# Patient Record
Sex: Male | Born: 1970 | Race: White | Hispanic: No | Marital: Single | Smoking: Never smoker
Health system: Southern US, Community
[De-identification: ages and names within clinical notes are randomized; demographics above are authoritative.]

## PROBLEM LIST (undated history)

## (undated) DIAGNOSIS — I10 Essential (primary) hypertension: Secondary | ICD-10-CM

## (undated) DIAGNOSIS — F1020 Alcohol dependence, uncomplicated: Secondary | ICD-10-CM

## (undated) DIAGNOSIS — M069 Rheumatoid arthritis, unspecified: Secondary | ICD-10-CM

## (undated) DIAGNOSIS — F329 Major depressive disorder, single episode, unspecified: Secondary | ICD-10-CM

## (undated) DIAGNOSIS — F419 Anxiety disorder, unspecified: Secondary | ICD-10-CM

## (undated) DIAGNOSIS — F1910 Other psychoactive substance abuse, uncomplicated: Secondary | ICD-10-CM

## (undated) DIAGNOSIS — F32A Depression, unspecified: Secondary | ICD-10-CM

## (undated) HISTORY — PX: OTHER SURGICAL HISTORY: SHX169

## (undated) HISTORY — DX: Other psychoactive substance abuse, uncomplicated: F19.10

---

## 2005-09-11 ENCOUNTER — Emergency Department: Payer: Self-pay | Admitting: Unknown Physician Specialty

## 2007-02-17 ENCOUNTER — Emergency Department: Payer: Self-pay | Admitting: Emergency Medicine

## 2007-02-17 ENCOUNTER — Other Ambulatory Visit: Payer: Self-pay

## 2008-10-05 ENCOUNTER — Other Ambulatory Visit: Payer: Self-pay

## 2008-10-05 ENCOUNTER — Emergency Department: Payer: Self-pay | Admitting: Emergency Medicine

## 2011-02-19 ENCOUNTER — Inpatient Hospital Stay: Payer: Self-pay | Admitting: Internal Medicine

## 2014-02-10 ENCOUNTER — Encounter (HOSPITAL_COMMUNITY): Payer: Self-pay | Admitting: Emergency Medicine

## 2014-02-10 ENCOUNTER — Emergency Department (HOSPITAL_COMMUNITY)
Admission: EM | Admit: 2014-02-10 | Discharge: 2014-02-12 | Disposition: A | Discharge status: Other Acute Inpatient Hospital | Payer: No Typology Code available for payment source | Attending: Emergency Medicine | Admitting: Emergency Medicine

## 2014-02-10 DIAGNOSIS — R000 Tachycardia, unspecified: Secondary | ICD-10-CM | POA: Insufficient documentation

## 2014-02-10 DIAGNOSIS — Z008 Encounter for other general examination: Secondary | ICD-10-CM | POA: Insufficient documentation

## 2014-02-10 DIAGNOSIS — Z8659 Personal history of other mental and behavioral disorders: Secondary | ICD-10-CM | POA: Insufficient documentation

## 2014-02-10 DIAGNOSIS — F411 Generalized anxiety disorder: Secondary | ICD-10-CM | POA: Insufficient documentation

## 2014-02-10 DIAGNOSIS — F259 Schizoaffective disorder, unspecified: Secondary | ICD-10-CM | POA: Diagnosis present

## 2014-02-10 DIAGNOSIS — R443 Hallucinations, unspecified: Secondary | ICD-10-CM | POA: Insufficient documentation

## 2014-02-10 DIAGNOSIS — F17200 Nicotine dependence, unspecified, uncomplicated: Secondary | ICD-10-CM | POA: Insufficient documentation

## 2014-02-10 DIAGNOSIS — F319 Bipolar disorder, unspecified: Secondary | ICD-10-CM | POA: Diagnosis present

## 2014-02-10 DIAGNOSIS — I10 Essential (primary) hypertension: Secondary | ICD-10-CM | POA: Insufficient documentation

## 2014-02-10 HISTORY — DX: Essential (primary) hypertension: I10

## 2014-02-10 LAB — CBC
HCT: 43.8 % (ref 39.0–52.0)
Hemoglobin: 15.4 g/dL (ref 13.0–17.0)
MCH: 30.9 pg (ref 26.0–34.0)
MCHC: 35.2 g/dL (ref 30.0–36.0)
MCV: 87.8 fL (ref 78.0–100.0)
Platelets: 285 10*3/uL (ref 150–400)
RBC: 4.99 MIL/uL (ref 4.22–5.81)
RDW: 14.6 % (ref 11.5–15.5)
WBC: 8.8 10*3/uL (ref 4.0–10.5)

## 2014-02-10 LAB — COMPREHENSIVE METABOLIC PANEL
ALT: 34 U/L (ref 0–53)
AST: 39 U/L — AB (ref 0–37)
Albumin: 4.3 g/dL (ref 3.5–5.2)
Alkaline Phosphatase: 76 U/L (ref 39–117)
BUN: 16 mg/dL (ref 6–23)
CALCIUM: 10 mg/dL (ref 8.4–10.5)
CO2: 21 meq/L (ref 19–32)
Chloride: 95 mEq/L — ABNORMAL LOW (ref 96–112)
Creatinine, Ser: 1.08 mg/dL (ref 0.50–1.35)
GFR calc Af Amer: >90 mL/min (ref >90)
GFR, EST NON AFRICAN AMERICAN: 84 mL/min — AB (ref >90)
Glucose, Bld: 92 mg/dL (ref 70–99)
POTASSIUM: 3.8 meq/L (ref 3.7–5.3)
SODIUM: 136 meq/L — AB (ref 137–147)
Total Bilirubin: 1.3 mg/dL — ABNORMAL HIGH (ref 0.3–1.2)
Total Protein: 7.8 g/dL (ref 6.0–8.3)

## 2014-02-10 LAB — RAPID URINE DRUG SCREEN, HOSP PERFORMED
AMPHETAMINES: POSITIVE — AB
Barbiturates: NOT DETECTED
Benzodiazepines: NOT DETECTED
COCAINE: NOT DETECTED
Opiates: NOT DETECTED
TETRAHYDROCANNABINOL: POSITIVE — AB

## 2014-02-10 LAB — ETHANOL: Alcohol, Ethyl (B): <11 mg/dL (ref 0–11)

## 2014-02-10 LAB — ACETAMINOPHEN LEVEL

## 2014-02-10 LAB — SALICYLATE LEVEL: Salicylate Lvl: <2 mg/dL — ABNORMAL LOW (ref 2.8–20.0)

## 2014-02-10 MED ORDER — HALOPERIDOL LACTATE 5 MG/ML IJ SOLN
5.0000 mg | Freq: Once | INTRAMUSCULAR | Status: AC
  Administered 2014-02-10: 5 mg via INTRAMUSCULAR
  Filled 2014-02-10: qty 1

## 2014-02-10 MED ORDER — LORAZEPAM 1 MG PO TABS
1.0000 mg | ORAL_TABLET | Freq: Three times a day (TID) | ORAL | Status: DC | PRN
  Administered 2014-02-11 (×2): 1 mg via ORAL
  Filled 2014-02-10 (×3): qty 1

## 2014-02-10 MED ORDER — ONDANSETRON HCL 4 MG PO TABS: 4.0000 mg | ORAL_TABLET | Freq: Three times a day (TID) | ORAL | Status: DC | PRN

## 2014-02-10 MED ORDER — IBUPROFEN 200 MG PO TABS: 600.0000 mg | ORAL_TABLET | Freq: Three times a day (TID) | ORAL | Status: DC | PRN

## 2014-02-10 MED ORDER — ALUM & MAG HYDROXIDE-SIMETH 200-200-20 MG/5ML PO SUSP: 30.0000 mL | ORAL | Status: DC | PRN

## 2014-02-10 MED ORDER — ZOLPIDEM TARTRATE 5 MG PO TABS
5.0000 mg | ORAL_TABLET | Freq: Every evening | ORAL | Status: DC | PRN
  Administered 2014-02-11: 5 mg via ORAL
  Filled 2014-02-10: qty 1

## 2014-02-10 MED ORDER — NICOTINE 21 MG/24HR TD PT24: 21.0000 mg | MEDICATED_PATCH | Freq: Every day | TRANSDERMAL | Status: DC

## 2014-02-10 MED ORDER — LORAZEPAM 1 MG PO TABS
1.0000 mg | ORAL_TABLET | Freq: Once | ORAL | Status: AC
  Administered 2014-02-10: 1 mg via ORAL
  Filled 2014-02-10: qty 1

## 2014-02-10 NOTE — ED Notes (Addendum)
According to [place] best friend, schizoaffective disorder. Increasingly more paranoid. Hospitalized in [place] about 7 months ago. Hx of alcohol and drug addiction but to her knowledge has been in recovery less than a year. Pt has hx of hurting himself. Scars on arms are 24 or 43 years old.  Leandro Reasoner number is [phone]

## 2014-02-10 NOTE — BH Assessment (Addendum)
Tele Assessment Note   Curtis Cobb is an 43 y.o. male that was assessed by CSW on this date, in person after presenting to [facility] voluntarily voicing complaints of auditory and visual hallucinations.  Pt presents with pleasant mood but appears agitated, angry at times and preoccupied by the voices.  Pt continuously apologized to CSW for not being able to answer CSW's questions due to being distracted by the voices.  Pt states that he is trying not to listen to them but was observed to respond to them, making statements like "I'm not going to be mad!!!" and "I'm on meds now." and yelling at them.  When asked further about these voices, such as what they were saying and if they had names, pt states that currently he was listening to his friends Curtis Cobb and Curtis Cobb talking outside of the room.  Pt directed CSW to open and close his room door a few times throughout the assessment.  CSW was informed later by pt's RN that his friend Curtis Cobb left awhile ago and was not in the hospital at all, as pt believed.  Pt states that his voices are either one individual or a group having a conversation.  Voices don't appear to be command at this time.    Pt states that he stopped taking his meds a few months ago because he felt like he was doing better but over the last month his mental health has declined.  Pt expressed a lot of guilt for feeling like he let down his friends and himself, for not caring for himself as he should.  Pt states that he tried to accept the voices and learn to deal with them on his own but it didn't work.  Pt denies SI/HI.  Pt states that he was hospitalized inpatient awhile ago in [place] and followed up outpatient at [facility] in [place] after d/c.  Pt reports he did really well on Haldol and Lithium at that time.  Pt reports a history of drug and alcohol use but was unable to answer last use or what substances.  Pt reports living in [place] and his friend Curtis Cobb living in  [place].  CSW attempted to reach [facility] for collateral information but no one answered and no voicemail is set up [phone]).    Pt exhibits thought blocking and an inability to concentrate on the present conversation.  Pt takes a long time to answer questions or doesn't answer them at all, due to responding to the voices.  Pt appears very intelligent by being able to explain his diagnosis, symptoms and history of mental health treatment.  Pt was observed to make odd movements, such as tensing his entire body and releasing as well as shaking his hands and feet.  Pt states that he knows he needs inpatient hospitalization to get back on his meds and get better.  CSW ran pt by Nanine Means, NP and pt was accepted to [facility] pending a 400 hall bed and TTS should continue to seek placement elsewhere.     Axis I: Psychotic Disorder NOS Axis II: Deferred Axis III:  Past Medical History  Diagnosis Date  . Bipolar 1 disorder   . Schizophrenia   . Hypertension    Axis IV: occupational problems, other psychosocial or environmental problems, problems related to social environment, problems with access to health care services and problems with primary support group Axis V: 11-20 some danger of hurting self or others possible OR occasionally fails to maintain minimal  personal hygiene OR gross impairment in communication  Past Medical History:  Past Medical History  Diagnosis Date  . Bipolar 1 disorder   . Schizophrenia   . Hypertension     History reviewed. No pertinent past surgical history.  Family History: No family history on file.  Social History:  reports that he has been smoking Cigarettes.  He has been smoking about 0.00 packs per day. He does not have any smokeless tobacco history on file. He reports that he drinks alcohol. He reports that he uses illicit drugs (Marijuana).  Additional Social History:  Alcohol / Drug Use Pain Medications: See MAR Prescriptions: See MAR Over the  Counter: See MAR History of alcohol / drug use?: Yes Longest period of sobriety (when/how long): unknown drug history as pt was unable to say at this time  CIWA: CIWA-Ar BP: 134/74 mmHg Pulse Rate: 109 COWS:    Allergies:  Allergies  Allergen Reactions  . Sulfa Antibiotics Other (See Comments)    Unknown reaction    Home Medications:  (Not in a hospital admission)  OB/GYN Status:  No LMP for male patient.  General Assessment Data [facility]: [facility] ACT Assessment: Yes Is this a Tele or Face-to-Face Assessment?: Face-to-Face Is this an Initial Assessment or a Re-assessment for this encounter?: Initial Assessment Living Arrangements: Other (Comment) (unknown) Can pt return to current living arrangement?:  (unknown) Admission Status: Voluntary Is patient capable of signing voluntary admission?: Yes Transfer from: Home Referral Source: Self/Family/Friend  Medical Screening Exam [facility] Walk-in ONLY) Medical Exam completed: Yes  [facility] Crisis Care Plan Living Arrangements: Other (Comment) (unknown) Name of Psychiatrist:  (N/A) Name of Therapist:  (N/A)  Education Status Is patient currently in school?: No Current Grade:  (N/A) Highest grade of school patient has completed:  (unknown) Name of school:  (N/A) Contact person:  (N/A)  Risk to self Suicidal Ideation: No Suicidal Intent: No Is patient at risk for suicide?: No Suicidal Plan?: No Access to Means: No What has been your use of drugs/alcohol within the last 12 months?:  (pt reports a history of alcohol/drug use) Previous Attempts/Gestures: Yes How many times?:  (1) Other Self Harm Risks:  (N/A) Triggers for Past Attempts: Unknown Intentional Self Injurious Behavior: Cutting (in the past) Comment - Self Injurious Behavior:  (N/A) Family Suicide History: Unknown Recent stressful life event(s):  (unknown) Persecutory voices/beliefs?: Yes Depression: Yes Depression Symptoms: Guilt;Feeling  worthless/self pity;Feeling angry/irritable Substance abuse history and/or treatment for substance abuse?: No Suicide prevention information given to non-admitted patients: Not applicable  Risk to Others Homicidal Ideation: No Thoughts of Harm to Others: No Current Homicidal Intent: No Current Homicidal Plan: No Access to Homicidal Means: No Identified Victim:  (N/A) History of harm to others?: No Assessment of Violence: None Noted Violent Behavior Description:  (N/A) Does patient have access to weapons?: No Criminal Charges Pending?: No Does patient have a court date: No  Psychosis Hallucinations: Auditory;Visual;With command Delusions: Unspecified  Mental Status Report Appear/Hygiene: Disheveled Eye Contact: Poor Motor Activity: Agitation;Mannerisms;Restlessness;Rigidity Speech: Tangential;Pressured Level of Consciousness: Alert;Quiet/awake;Restless;Irritable Mood: Preoccupied;Ambivalent;Ashamed/humiliated;Worthless, low self-esteem;Helpless;Guilty;Fearful Affect: Preoccupied;Irritable;Apprehensive Anxiety Level: Moderate Thought Processes: Irrelevant;Tangential Judgement: Impaired Orientation: Unable to assess Obsessive Compulsive Thoughts/Behaviors: None  Cognitive Functioning Concentration: Decreased Memory: Recent Impaired;Remote Impaired IQ: Above Average Insight: Good Impulse Control: Good Appetite: Good Weight Loss:  (N/A) Weight Gain:  (N/A) Sleep: No Change Total Hours of Sleep:  (unknown) Vegetative Symptoms: None  ADLScreening [facility] Assessment Services) Patient's cognitive ability adequate to safely complete daily  activities?: Yes Patient able to express need for assistance with ADLs?: Yes Independently performs ADLs?: Yes (appropriate for developmental age)  Prior Inpatient Therapy Prior Inpatient Therapy: Yes Prior Therapy Dates:  (pt unsure - MD note states 7 months ago) Prior Therapy Facilty/Provider(s):  (hospital in [place]) Reason for  Treatment:  (psychosis)  Prior Outpatient Therapy Prior Outpatient Therapy: Yes Prior Therapy Dates:  [facility] in [place]) Prior Therapy Facilty/Provider(s):  (7 months ago) Reason for Treatment:  (psychosis)  ADL Screening (condition at time of admission) Patient's cognitive ability adequate to safely complete daily activities?: Yes Is the patient deaf or have difficulty hearing?: No Does the patient have difficulty seeing, even when wearing glasses/contacts?: No Does the patient have difficulty concentrating, remembering, or making decisions?: Yes Patient able to express need for assistance with ADLs?: Yes Does the patient have difficulty dressing or bathing?: No Independently performs ADLs?: Yes (appropriate for developmental age) Does the patient have difficulty walking or climbing stairs?: No Weakness of Legs: None Weakness of Arms/Hands: None  Home Assistive Devices/Equipment Home Assistive Devices/Equipment: None  Therapy Consults (therapy consults require a physician order) PT Evaluation Needed: No OT Evalulation Needed: No SLP Evaluation Needed: No   Values / Beliefs Cultural Requests During Hospitalization: None Spiritual Requests During Hospitalization: None   Advance Directives (For Healthcare) Advance Directive: Patient does not have advance directive Pre-existing out of facility DNR order (yellow form or pink MOST form): No Nutrition Screen- [facility] Adult/[facility] Patient's home diet: Regular  Additional Information 1:1 In Past 12 Months?: No CIRT Risk: No Elopement Risk: No Does patient have medical clearance?: Yes  Child/Adolescent Assessment Running Away Risk: Denies Bed-Wetting: Denies Destruction of Property: Denies Cruelty to Animals: Denies Stealing: Denies Rebellious/Defies Authority: Denies Satanic Involvement: Denies Archivist: Denies Problems at [employer]: Denies Gang Involvement: Denies  Disposition:   Disposition Initial Assessment Completed for this Encounter: Yes Disposition of Patient: Inpatient treatment program Type of inpatient treatment program: Adult  Carmina Miller 02/10/2014 5:13 PM

## 2014-02-10 NOTE — ED Provider Notes (Signed)
Medical screening examination/treatment/procedure(s) were conducted as a shared visit with non-physician practitioner(s) and myself.  I personally evaluated the patient during the encounter.  EKG Interpretation    Date/Time:  Saturday February 10 2014 14:54:39 EST Ventricular Rate:  122 PR Interval:  147 QRS Duration: 89 QT Interval:  326 QTC Calculation: 464 R Axis:   20 Text Interpretation:  Sinus tachycardia Probable left atrial enlargement Anteroseptal infarct, age indeterminate Confirmed by Fayrene Fearing  MD, MARK ([id number]) on 02/10/2014 2:57:36 PM            43 yo male with hx of schizophrenia presenting due to hallucinations.  States he has been hearing voices frequently.  Usually just people having conversations.  Not commanding him.  No SI/HI.  On exam, he appears very anxious, frequently appears to be responding to internal stimuli, but nontoxic, normal respiratory pattern and perfusion.  Plan TTS consult for psychiatric eval.    Clinical Impression: 1. Hallucinations   2. Medical clearance for psychiatric admission       Candyce Churn III, MD 02/11/14 [phone]

## 2014-02-10 NOTE — ED Provider Notes (Signed)
CSN: [id number]     Arrival date & time 02/10/14  1415 History   First MD Initiated Contact with Patient 02/10/14 1458     Chief Complaint  Patient presents with  . Medical Clearance     (Consider location/radiation/quality/duration/timing/severity/associated sxs/prior Treatment) HPI Comments: Patient is a 43 year old male with a past medical history of bipolar, schizophrenia and hypertension who presents to the emergency department complaining of hallucinations over the past year worsening over the past month. Patient states in the past he was on Haldol and lithium, however a few months ago he decided to stop taking the medications because he was feeling better. Over the past month he states he has had a "slow breakdown" and is beginning to have both auditory and visual hallucinations. Currently he states there is some out of the exam room named "[name]" trying to talk to him. Admits to alcohol use and marijuana use 3 weeks ago. Denies suicidal or homicidal ideations, however he did have a suicide attempt about 20 years ago. Patient continuously saying "this does not make any sense, this is nuts, this is crazy". Level 5 caveat due to psychosis.  The history is provided by the patient.    Past Medical History  Diagnosis Date  . Bipolar 1 disorder   . Schizophrenia   . Hypertension    History reviewed. No pertinent past surgical history. No family history on file. History  Substance Use Topics  . Smoking status: Current Every Day Smoker    Types: Cigarettes  . Smokeless tobacco: Not on file  . Alcohol Use: Yes     Comment: socially    Review of Systems  Unable to perform ROS: Psychiatric disorder      Allergies  Sulfa antibiotics  Home Medications  No current outpatient prescriptions on file. BP 120/74  Pulse 98  Temp(Src) 98.5 F (36.9 C) (Oral)  Resp 20  SpO2 94% Physical Exam  Nursing note and vitals reviewed. Constitutional: He is oriented to person, place, and  time. He appears well-developed and well-nourished. No distress.  Anxious. Fidgety.  HENT:  Head: Normocephalic and atraumatic.  Mouth/Throat: Oropharynx is clear and moist.  Eyes: Conjunctivae and EOM are normal. Pupils are equal, round, and reactive to light.  Neck: Normal range of motion. Neck supple.  Cardiovascular: Regular rhythm and normal heart sounds.  Tachycardia present.   Pulmonary/Chest: Effort normal and breath sounds normal.  Musculoskeletal: Normal range of motion. He exhibits no edema.  Neurological: He is alert and oriented to person, place, and time.  Skin: Skin is warm and dry. He is not diaphoretic.  Psychiatric: His mood appears anxious. He is actively hallucinating. He expresses no homicidal and no suicidal ideation.    ED Course  Procedures (including critical care time) Labs Review Labs Reviewed  COMPREHENSIVE METABOLIC PANEL - Abnormal; Notable for the following:    Sodium 136 (*)    Chloride 95 (*)    AST 39 (*)    Total Bilirubin 1.3 (*)    GFR calc non Af Amer 84 (*)    All other components within normal limits  SALICYLATE LEVEL - Abnormal; Notable for the following:    Salicylate Lvl <2.0 (*)    All other components within normal limits  URINE RAPID DRUG SCREEN (HOSP PERFORMED) - Abnormal; Notable for the following:    Amphetamines POSITIVE (*)    Tetrahydrocannabinol POSITIVE (*)    All other components within normal limits  ACETAMINOPHEN LEVEL  CBC  ETHANOL  Imaging Review No results found.  EKG Interpretation    Date/Time:  Saturday February 10 2014 14:54:39 EST Ventricular Rate:  122 PR Interval:  147 QRS Duration: 89 QT Interval:  326 QTC Calculation: 464 R Axis:   20 Text Interpretation:  Sinus tachycardia Probable left atrial enlargement Anteroseptal infarct, age indeterminate Confirmed by Fayrene Fearing  MD, MARK ([id number]) on 02/10/2014 2:57:36 PM            MDM   Final diagnoses:  Hallucinations  Medical clearance for  psychiatric admission    Pt presenting with hallucinations. No SI/HI. Very anxious. Labs pending. TTS consult.  Patient medically cleared. He was evaluated by psychiatric social worker who suggested inpatient treatment.  Trevor Mace, PA-C 02/11/14 0005

## 2014-02-10 NOTE — ED Notes (Signed)
Pt from home reports that for the past month he has been hearing voices, complete conversations for the past year. Pt received was placed on Haldol and lithium. Pt felt that he was feeling better and voices stopped, so stopped taking meds. Pt reports a "slow breakdown since." Pt is anxious, shaking and tachycardic at 137 in triage. Pt states that he is having hallucinations at night such as shadows in the shapes of people. Pt denies SI/HI, but states that he has hx of suicide attempt approx 20 years ago. Pt is A&O and in NAD but extremely scared and anxious stating "this isn't making any sense."

## 2014-02-11 ENCOUNTER — Encounter (HOSPITAL_COMMUNITY): Payer: Self-pay | Admitting: Registered Nurse

## 2014-02-11 DIAGNOSIS — F319 Bipolar disorder, unspecified: Secondary | ICD-10-CM

## 2014-02-11 DIAGNOSIS — F411 Generalized anxiety disorder: Secondary | ICD-10-CM

## 2014-02-11 DIAGNOSIS — F259 Schizoaffective disorder, unspecified: Secondary | ICD-10-CM | POA: Diagnosis present

## 2014-02-11 MED ORDER — THIAMINE HCL 100 MG/ML IJ SOLN: 100.0000 mg | Freq: Every day | INTRAMUSCULAR | Status: DC

## 2014-02-11 MED ORDER — LORAZEPAM 1 MG PO TABS: 0.0000 mg | ORAL_TABLET | Freq: Two times a day (BID) | ORAL | Status: DC

## 2014-02-11 MED ORDER — LITHIUM CARBONATE 300 MG PO CAPS
300.0000 mg | ORAL_CAPSULE | Freq: Three times a day (TID) | ORAL | Status: DC
  Administered 2014-02-11 – 2014-02-12 (×3): 300 mg via ORAL
  Filled 2014-02-11 (×3): qty 1

## 2014-02-11 MED ORDER — HALOPERIDOL 2 MG PO TABS
2.0000 mg | ORAL_TABLET | Freq: Two times a day (BID) | ORAL | Status: DC
  Administered 2014-02-11 – 2014-02-12 (×2): 2 mg via ORAL
  Filled 2014-02-11 (×2): qty 1

## 2014-02-11 MED ORDER — TRAZODONE HCL 100 MG PO TABS
100.0000 mg | ORAL_TABLET | Freq: Every evening | ORAL | Status: DC | PRN
  Administered 2014-02-11: 100 mg via ORAL
  Filled 2014-02-11: qty 1

## 2014-02-11 MED ORDER — LORAZEPAM 1 MG PO TABS
0.0000 mg | ORAL_TABLET | Freq: Four times a day (QID) | ORAL | Status: DC
  Administered 2014-02-11 – 2014-02-12 (×3): 1 mg via ORAL
  Filled 2014-02-11 (×2): qty 1

## 2014-02-11 MED ORDER — VITAMIN B-1 100 MG PO TABS
100.0000 mg | ORAL_TABLET | Freq: Every day | ORAL | Status: DC
  Administered 2014-02-11 – 2014-02-12 (×2): 100 mg via ORAL
  Filled 2014-02-11 (×2): qty 1

## 2014-02-11 NOTE — BH Assessment (Signed)
Per Joann Glover, AC at [facility], adult unit is at capacity. Contacted the following facilities for placement:  [facility]: At capacity [facility]: At capacity [facility]: At capacity [facility]: At capacity [facility]: At capacity [facility]: At capacity [facility]: At capacity [facility]: At capacity [facility]: At capacity [facility]: At capacity [facility]: At capacity [facility]: At capacity [facility]: At capacity [facility]: At capacity  Curtis Cobb, LPC, NCC Triage Specialist 

## 2014-02-11 NOTE — [facility] Notes (Signed)
The following facilities have been contacted regarding bed availability for inptx:   [facility]- per Cathryn at capacity  [facility]-per Mariam at capacity  [facility]- per Lacey at capacity  [facility]- per Maria at capacity  [facility]- per Susie at capacity  [facility]- per kayla at capacity  [facility]- per Sherida at capacity  [facility]- per Pam at acapacity  [facility]- per Tarshika at capacity  [facility]- per Barbara at capacity  [facility]- at capacity  [facility]- per Nacy at capacity  [facility]- per Alex at capacity  [facility]- per jason at capacity  [facility]- per Beth at capacity  [facility]- at capacity  [facility]- per Linda at capacity  [facility]- per Joan at capacity  [facility]- per Marian at capacity  [facility]- per Dana at capacity  [facility]- at capacity  [facility]- per Pat on voluntary pts  [facility]- per Susan at capacity  [facility]- per Latrosy at capacity  [facility]- at capacity  [facility]- per Jean at capacity    Ceyda Peterka  Disposition [facility]   

## 2014-02-11 NOTE — ED Notes (Signed)
Pt became very tearful, began to pace the room. Pt sts that he was having a panic attack. Pt took some deep breathes and began to talk about how he just realized that his worl dis falling apart and he can't handle it. I sat and talked with pt. aout his fears. Pt egan to calm down. Pt requestion his PRN medication to help him sleep and relax.

## 2014-02-11 NOTE — ED Notes (Signed)
Pt states he drinks approximately a fifth of liquor a day.  Pt states his last drink was shortly before checking in to ED.  When told that his ETOH level was less than 11 on arrival, but states, "Well, it might have been the day before."

## 2014-02-11 NOTE — ED Notes (Signed)
Pt has 3 pt. Belonging bags:  Bag 1: Black jacket, Firefighter 2: [employer], Khaki pants with grey belt  Bag 3: Black socks, White Tennis shoes.

## 2014-02-11 NOTE — ED Notes (Signed)
Pt now sitting quietly on ed. Pt still appears to be anxious but much less than before.

## 2014-02-11 NOTE — ED Notes (Signed)
Pt complaining of hallucinations - auditory and visual.  Psych doctor informed.

## 2014-02-11 NOTE — ED Notes (Signed)
3 patient belongings bags moved to locker 29.

## 2014-02-11 NOTE — ED Notes (Signed)
Pt's 3 belongings bags moved to locker 35.

## 2014-02-11 NOTE — ED Notes (Signed)
Patient's belongings in locker #26.

## 2014-02-11 NOTE — Consult Note (Signed)
[facility] Consult   Reason for Consult:  Hearing voices and panic attacks Referring Physician:  EDp  Curtis Cobb is an 43 y.o. male. Total Time spent with patient: 45 minutes  Assessment: AXIS I:  Anxiety Disorder NOS, Schizoaffective Disorder and Bipolar Disorder AXIS II:  Deferred AXIS III:   Past Medical History  Diagnosis Date  . Bipolar 1 disorder   . Schizophrenia   . Hypertension    AXIS IV:  other psychosocial or environmental problems AXIS V:  21-30 behavior considerably influenced by delusions or hallucinations OR serious impairment in judgment, communication OR inability to function in almost all areas  Plan:  Recommend psychiatric Inpatient admission when medically cleared.  Subjective:   [name] is a 43 y.o. male patient admitted with Schizoaffective Disorder, Anxiety Disorder NOS, and History of Bipolar Disorder.  HPI:  Patient states "I have been hearing voices and seeing things and I have extreme panic attacks.  I have been off of my medicine for several months.  I see Dr. Rosine Door here in [place].  I was taking Haldol."  Patient states that he came off of his medication related to feeling better; but medication did work when he was taking them.  Patient denies suicidal/homicidal ideation, and paranoia.  The voices are calling his name and the visions are shadows.   HPI Elements:   Location:  hallucinations. Quality:  off medication and worsening depression. Severity:   off medication and worsening depression.. Timing:  several.  Past Psychiatric History: Past Medical History  Diagnosis Date  . Bipolar 1 disorder   . Schizophrenia   . Hypertension     reports that he has been smoking Cigarettes.  He has been smoking about 0.00 packs per day. He does not have any smokeless tobacco history on file. He reports that he drinks alcohol. He reports that he uses illicit drugs (Marijuana). No family history on file. Family History Substance  Abuse: Yes, Describe: Family Supports: Yes, List: Thressa Sheller - friend - [phone]) Living Arrangements: Other (Comment) (unknown) Can pt return to current living arrangement?:  (unknown)   Allergies:   Allergies  Allergen Reactions  . Sulfa Antibiotics Other (See Comments)    Unknown reaction    ACT Assessment Complete:  Yes:    Educational Status    Risk to Self: Risk to self Suicidal Ideation: No Suicidal Intent: No Is patient at risk for suicide?: No Suicidal Plan?: No Access to Means: No What has been your use of drugs/alcohol within the last 12 months?:  (pt reports a history of alcohol/drug use) Previous Attempts/Gestures: Yes How many times?:  (1) Other Self Harm Risks:  (N/A) Triggers for Past Attempts: Unknown Intentional Self Injurious Behavior: Cutting (in the past) Comment - Self Injurious Behavior:  (N/A) Family Suicide History: Unknown Recent stressful life event(s):  (unknown) Persecutory voices/beliefs?: Yes Depression: Yes Depression Symptoms: Guilt;Feeling worthless/self pity;Feeling angry/irritable Substance abuse history and/or treatment for substance abuse?: Yes Suicide prevention information given to non-admitted patients: Not applicable  Risk to Others: Risk to Others Homicidal Ideation: No Thoughts of Harm to Others: No Current Homicidal Intent: No Current Homicidal Plan: No Access to Homicidal Means: No Identified Victim:  (N/A) History of harm to others?: No Assessment of Violence: None Noted Violent Behavior Description:  (N/A) Does patient have access to weapons?: No Criminal Charges Pending?: No Does patient have a court date: No  Abuse:    Prior Inpatient Therapy: Prior Inpatient Therapy Prior Inpatient Therapy: Yes Prior Therapy  Dates:  (pt unsure - MD note states 7 months ago) Prior Therapy Facilty/Provider(s):  (hospital in [place]) Reason for Treatment:  (psychosis)  Prior Outpatient Therapy: Prior Outpatient Therapy Prior  Outpatient Therapy: Yes Prior Therapy Dates:  ([facility] in [place]) Prior Therapy Facilty/Provider(s):  (7 months ago) Reason for Treatment:  (psychosis)  Additional Information: Additional Information 1:1 In Past 12 Months?: No CIRT Risk: No Elopement Risk: No Does patient have medical clearance?: Yes                  Objective: Blood pressure 138/86, pulse 102, temperature 97.7 F (36.5 C), temperature source Oral, resp. rate 18, SpO2 97.00%.There is no height or weight on file to calculate BMI. Results for orders placed during the hospital encounter of 02/10/14 (from the past 72 hour(s))  ACETAMINOPHEN LEVEL     Status: None   Collection Time    02/10/14  3:10 PM      Result Value Ref Range   Acetaminophen (Tylenol), Serum <15.0  10 - 30 ug/mL   Comment:            THERAPEUTIC CONCENTRATIONS VARY     SIGNIFICANTLY. A RANGE OF 10-30     ug/mL MAY BE AN EFFECTIVE     CONCENTRATION FOR MANY PATIENTS.     HOWEVER, SOME ARE BEST TREATED     AT CONCENTRATIONS OUTSIDE THIS     RANGE.     ACETAMINOPHEN CONCENTRATIONS     >150 ug/mL AT 4 HOURS AFTER     INGESTION AND >50 ug/mL AT 12     HOURS AFTER INGESTION ARE     OFTEN ASSOCIATED WITH TOXIC     REACTIONS.  CBC     Status: None   Collection Time    02/10/14  3:10 PM      Result Value Ref Range   WBC 8.8  4.0 - 10.5 K/uL   RBC 4.99  4.22 - 5.81 MIL/uL   Hemoglobin 15.4  13.0 - 17.0 g/dL   HCT 43.8  39.0 - 52.0 %   MCV 87.8  78.0 - 100.0 fL   MCH 30.9  26.0 - 34.0 pg   MCHC 35.2  30.0 - 36.0 g/dL   RDW 14.6  11.5 - 15.5 %   Platelets 285  150 - 400 K/uL  COMPREHENSIVE METABOLIC PANEL     Status: Abnormal   Collection Time    02/10/14  3:10 PM      Result Value Ref Range   Sodium 136 (*) 137 - 147 mEq/L   Potassium 3.8  3.7 - 5.3 mEq/L   Chloride 95 (*) 96 - 112 mEq/L   CO2 21  19 - 32 mEq/L   Glucose, Bld 92  70 - 99 mg/dL   BUN 16  6 - 23 mg/dL   Creatinine, Ser 1.08  0.50 -  1.35 mg/dL   Calcium 10.0  8.4 - 10.5 mg/dL   Total Protein 7.8  6.0 - 8.3 g/dL   Albumin 4.3  3.5 - 5.2 g/dL   AST 39 (*) 0 - 37 U/L   ALT 34  0 - 53 U/L   Alkaline Phosphatase 76  39 - 117 U/L   Total Bilirubin 1.3 (*) 0.3 - 1.2 mg/dL   GFR calc non Af Amer 84 (*) >90 mL/min   GFR calc Af Amer >90  >90 mL/min   Comment: (NOTE)     The eGFR has been calculated using  the CKD EPI equation.     This calculation has not been validated in all clinical situations.     eGFR's persistently <90 mL/min signify possible Chronic Kidney     Disease.  ETHANOL     Status: None   Collection Time    02/10/14  3:10 PM      Result Value Ref Range   Alcohol, Ethyl (B) <11  0 - 11 mg/dL   Comment:            LOWEST DETECTABLE LIMIT FOR     SERUM ALCOHOL IS 11 mg/dL     FOR MEDICAL PURPOSES ONLY  SALICYLATE LEVEL     Status: Abnormal   Collection Time    02/10/14  3:10 PM      Result Value Ref Range   Salicylate Lvl <7.0 (*) 2.8 - 20.0 mg/dL  URINE RAPID DRUG SCREEN (HOSP PERFORMED)     Status: Abnormal   Collection Time    02/10/14  4:12 PM      Result Value Ref Range   Opiates NONE DETECTED  NONE DETECTED   Cocaine NONE DETECTED  NONE DETECTED   Benzodiazepines NONE DETECTED  NONE DETECTED   Amphetamines POSITIVE (*) NONE DETECTED   Tetrahydrocannabinol POSITIVE (*) NONE DETECTED   Barbiturates NONE DETECTED  NONE DETECTED   Comment:            DRUG SCREEN FOR MEDICAL PURPOSES     ONLY.  IF CONFIRMATION IS NEEDED     FOR ANY PURPOSE, NOTIFY LAB     WITHIN 5 DAYS.                LOWEST DETECTABLE LIMITS     FOR URINE DRUG SCREEN     Drug Class       Cutoff (ng/mL)     Amphetamine      1000     Barbiturate      200     Benzodiazepine   623     Tricyclics       762     Opiates          300     Cocaine          300     THC              50   Labs are reviewed and are pertinent for the assessment of ETOH, illicit drug use and other medical issues.  Current Facility-Administered  Medications  Medication Dose Route Frequency Provider Last Rate Last Dose  . alum & mag hydroxide-simeth (MAALOX/MYLANTA) 200-200-20 MG/5ML suspension 30 mL  30 mL Oral PRN Illene Labrador, PA-C      . ibuprofen (ADVIL,MOTRIN) tablet 600 mg  600 mg Oral Q8H PRN Illene Labrador, PA-C      . LORazepam (ATIVAN) tablet 0-4 mg  0-4 mg Oral 4 times per day Jasper Riling. [place], MD   1 mg at 02/11/14 1427   Followed by  . [START ON 02/13/2014] LORazepam (ATIVAN) tablet 0-4 mg  0-4 mg Oral Q12H Nathan R. Pickering, MD      . LORazepam (ATIVAN) tablet 1 mg  1 mg Oral Q8H PRN Illene Labrador, PA-C   1 mg at 02/11/14 0800  . nicotine (NICODERM CQ - dosed in mg/24 hours) patch 21 mg  21 mg Transdermal Daily Robyn M Albert, PA-C      . ondansetron [facility]) tablet 4 mg  4 mg Oral Q8H PRN  Illene Labrador, PA-C      . thiamine (VITAMIN B-1) tablet 100 mg  100 mg Oral Daily Jasper Riling. [place], MD       Or  . thiamine (B-1) injection 100 mg  100 mg Intravenous Daily Jasper Riling. [place], MD      . zolpidem (AMBIEN) tablet 5 mg  5 mg Oral QHS PRN Illene Labrador, PA-C   5 mg at 02/11/14 0008   No current outpatient prescriptions on file.    Psychiatric Specialty Exam:     Blood pressure 138/86, pulse 102, temperature 97.7 F (36.5 C), temperature source Oral, resp. rate 18, SpO2 97.00%.There is no height or weight on file to calculate BMI.  General Appearance: Casual  Eye Contact::  Good  Speech:  Clear and Coherent and Normal Rate  Volume:  Normal  Mood:  Anxious  Affect:  Congruent  Thought Process:  Circumstantial and Goal Directed  Orientation:  Full (Time, Place, and Person)  Thought Content:  Hallucinations: Auditory Visual and Rumination  Suicidal Thoughts:  No  Homicidal Thoughts:  No  Memory:  Immediate;   Good Recent;   Good  Judgement:  Impaired  Insight:  Fair  Psychomotor Activity:  Normal  Concentration:  Fair  Recall:  Good  Fund of Knowledge:Good  Language: Good  Akathisia:  No   Handed:  Right  AIMS (if indicated):     Assets:  Communication Skills Desire for Improvement  Sleep:      Musculoskeletal: Strength & Muscle Tone: within normal limits Gait & Station: normal Patient leans: N/A  Treatment Plan Summary: Daily contact with patient to assess and evaluate symptoms and progress in treatment Medication management  Disposition: Inpatient treatment recommended.  Start Lithium 300 mg Tid and Haldol 2 mg BID.  Monitor for safety and stabilization until inpatient treatment bed has been located.   Earleen Newport FNP-BC 02/11/2014 5:15 PM  Patient was seen face-to-face for psychiatric evaluation, suicide risk assessment, case discussed with physician extender and formulated treatment Plan. Reviewed the information documented and agree with the treatment plan.  Reino Lybbert,JANARDHAHA R. 02/11/2014 6:44 PM

## 2014-02-12 ENCOUNTER — Encounter (HOSPITAL_COMMUNITY): Payer: Self-pay | Admitting: *Deleted

## 2014-02-12 ENCOUNTER — Encounter (HOSPITAL_COMMUNITY): Payer: Self-pay | Admitting: Psychiatry

## 2014-02-12 ENCOUNTER — Inpatient Hospital Stay (HOSPITAL_COMMUNITY)
Admission: AD | Admit: 2014-02-12 | Discharge: 2014-02-19 | DRG: 885 | Disposition: A | Discharge status: Home / Self Care | Payer: No Typology Code available for payment source | Source: Intra-hospital | Attending: Psychiatry | Admitting: Psychiatry

## 2014-02-12 DIAGNOSIS — I10 Essential (primary) hypertension: Secondary | ICD-10-CM | POA: Diagnosis present

## 2014-02-12 DIAGNOSIS — F319 Bipolar disorder, unspecified: Secondary | ICD-10-CM

## 2014-02-12 DIAGNOSIS — Z5900 Homelessness unspecified: Secondary | ICD-10-CM

## 2014-02-12 DIAGNOSIS — R443 Hallucinations, unspecified: Secondary | ICD-10-CM

## 2014-02-12 DIAGNOSIS — F259 Schizoaffective disorder, unspecified: Secondary | ICD-10-CM

## 2014-02-12 DIAGNOSIS — F1020 Alcohol dependence, uncomplicated: Secondary | ICD-10-CM | POA: Diagnosis present

## 2014-02-12 DIAGNOSIS — Z598 Other problems related to housing and economic circumstances: Secondary | ICD-10-CM

## 2014-02-12 DIAGNOSIS — Z5987 Material hardship due to limited financial resources, not elsewhere classified: Secondary | ICD-10-CM

## 2014-02-12 DIAGNOSIS — F1210 Cannabis abuse, uncomplicated: Secondary | ICD-10-CM | POA: Diagnosis present

## 2014-02-12 HISTORY — DX: Depression, unspecified: F32.A

## 2014-02-12 HISTORY — DX: Anxiety disorder, unspecified: F41.9

## 2014-02-12 HISTORY — DX: Major depressive disorder, single episode, unspecified: F32.9

## 2014-02-12 MED ORDER — CHLORDIAZEPOXIDE HCL 25 MG PO CAPS
25.0000 mg | ORAL_CAPSULE | Freq: Four times a day (QID) | ORAL | Status: AC | PRN
  Administered 2014-02-14 – 2014-02-15 (×2): 25 mg via ORAL
  Filled 2014-02-12: qty 1

## 2014-02-12 MED ORDER — CHLORDIAZEPOXIDE HCL 25 MG PO CAPS
25.0000 mg | ORAL_CAPSULE | Freq: Every day | ORAL | Status: AC
  Administered 2014-02-17: 25 mg via ORAL
  Filled 2014-02-12: qty 1

## 2014-02-12 MED ORDER — LOPERAMIDE HCL 2 MG PO CAPS: 2.0000 mg | ORAL_CAPSULE | ORAL | Status: AC | PRN

## 2014-02-12 MED ORDER — THIAMINE HCL 100 MG/ML IJ SOLN
100.0000 mg | Freq: Once | INTRAMUSCULAR | Status: DC
  Filled 2014-02-12: qty 2

## 2014-02-12 MED ORDER — CHLORDIAZEPOXIDE HCL 25 MG PO CAPS
25.0000 mg | ORAL_CAPSULE | Freq: Three times a day (TID) | ORAL | Status: AC
  Administered 2014-02-14 – 2014-02-15 (×3): 25 mg via ORAL
  Filled 2014-02-12 (×2): qty 1

## 2014-02-12 MED ORDER — MAGNESIUM HYDROXIDE 400 MG/5ML PO SUSP
30.0000 mL | Freq: Every day | ORAL | Status: DC | PRN
Start: 2014-02-12 — End: 2014-02-20

## 2014-02-12 MED ORDER — ACETAMINOPHEN 325 MG PO TABS
650.0000 mg | ORAL_TABLET | Freq: Four times a day (QID) | ORAL | Status: DC | PRN
Start: 2014-02-12 — End: 2014-02-20
  Administered 2014-02-13 – 2014-02-15 (×3): 650 mg via ORAL
  Filled 2014-02-12 (×3): qty 2

## 2014-02-12 MED ORDER — TRAZODONE HCL 100 MG PO TABS
100.0000 mg | ORAL_TABLET | Freq: Every evening | ORAL | Status: DC | PRN
  Administered 2014-02-14 – 2014-02-18 (×5): 100 mg via ORAL
  Filled 2014-02-12: qty 14
  Filled 2014-02-12 (×5): qty 1

## 2014-02-12 MED ORDER — ONDANSETRON 4 MG PO TBDP: 4.0000 mg | ORAL_TABLET | Freq: Four times a day (QID) | ORAL | Status: AC | PRN

## 2014-02-12 MED ORDER — CHLORDIAZEPOXIDE HCL 25 MG PO CAPS
25.0000 mg | ORAL_CAPSULE | Freq: Four times a day (QID) | ORAL | Status: AC
  Administered 2014-02-13 – 2014-02-14 (×4): 25 mg via ORAL
  Filled 2014-02-12 (×5): qty 1

## 2014-02-12 MED ORDER — LITHIUM CARBONATE 300 MG PO CAPS
300.0000 mg | ORAL_CAPSULE | Freq: Three times a day (TID) | ORAL | Status: DC
Start: 2014-02-13 — End: 2014-02-15
  Administered 2014-02-13 – 2014-02-14 (×5): 300 mg via ORAL
  Filled 2014-02-12 (×12): qty 1

## 2014-02-12 MED ORDER — CHLORDIAZEPOXIDE HCL 25 MG PO CAPS
25.0000 mg | ORAL_CAPSULE | ORAL | Status: AC
  Administered 2014-02-15 – 2014-02-16 (×2): 25 mg via ORAL
  Filled 2014-02-12 (×3): qty 1

## 2014-02-12 MED ORDER — HYDROXYZINE HCL 25 MG PO TABS
25.0000 mg | ORAL_TABLET | Freq: Four times a day (QID) | ORAL | Status: AC | PRN
  Administered 2014-02-13 – 2014-02-14 (×3): 25 mg via ORAL
  Filled 2014-02-12 (×4): qty 1

## 2014-02-12 MED ORDER — VITAMIN B-1 100 MG PO TABS
100.0000 mg | ORAL_TABLET | Freq: Every day | ORAL | Status: DC
  Administered 2014-02-13 – 2014-02-19 (×7): 100 mg via ORAL
  Filled 2014-02-12 (×10): qty 1

## 2014-02-12 MED ORDER — HALOPERIDOL 2 MG PO TABS
2.0000 mg | ORAL_TABLET | Freq: Two times a day (BID) | ORAL | Status: DC
  Administered 2014-02-12 – 2014-02-14 (×4): 2 mg via ORAL
  Filled 2014-02-12 (×9): qty 1

## 2014-02-12 MED ORDER — ADULT MULTIVITAMIN W/MINERALS CH
1.0000 | ORAL_TABLET | Freq: Every day | ORAL | Status: DC
  Administered 2014-02-13 – 2014-02-19 (×7): 1 via ORAL
  Filled 2014-02-12 (×10): qty 1
  Filled 2014-02-12: qty 14
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
## 2014-02-12 NOTE — Progress Notes (Signed)
[facility] provided pt with a list of primary care resources, highlighting [facility].  °

## 2014-02-12 NOTE — [facility] Counselor (Signed)
Per Tanna Savoy at [facility], pt accepted to bed 401-2. Support paperwork signed and faxed to [facility]. Originals placed in pt's chart.   Evette [name], [place] Assessment Counselor

## 2014-02-12 NOTE — Tx Team (Signed)
Initial Interdisciplinary Treatment Plan  PATIENT STRENGTHS: (choose at least two) Ability for insight Average or above average intelligence Capable of independent living Communication skills General fund of knowledge Motivation for treatment/growth Physical Health Supportive family/friends Work skills  PATIENT STRESSORS: Financial difficulties Medication change or noncompliance Occupational concerns Substance abuse   PROBLEM LIST: Problem List/Patient Goals Date to be addressed Date deferred Reason deferred Estimated date of resolution  Suicidal ideation 02/12/2014   D/c        Substance abuse 02/12/2014   D/c        depression 02/12/2014   D/c                           DISCHARGE CRITERIA:  Ability to meet basic life and health needs Adequate post-discharge living arrangements Improved stabilization in mood, thinking, and/or behavior Medical problems require only outpatient monitoring Motivation to continue treatment in a less acute level of care Need for constant or close observation no longer present Reduction of life-threatening or endangering symptoms to within safe limits Safe-care adequate arrangements made Verbal commitment to aftercare and medication compliance Withdrawal symptoms are absent or subacute and managed without 24-hour nursing intervention  PRELIMINARY DISCHARGE PLAN: Attend aftercare/continuing care group Attend PHP/IOP Attend 12-step recovery group Outpatient therapy Placement in alternative living arrangements  PATIENT/FAMIILY INVOLVEMENT: This treatment plan has been presented to and reviewed with the patient, Curtis Cobb.  The patient and family have been given the opportunity to ask questions and make suggestions.  Earline Mayotte 02/12/2014, 6:30 PM

## 2014-02-12 NOTE — Consult Note (Signed)
Subjective:  Patient complains of hearing voices and visual hallucinations.  He stopped taking his medications and using alcohol which increased his symptoms.  Zimir states he feels the Haldol and Lithium are starting to work.  Psychiatric Specialty Exam: Physical Exam  ROS  Blood pressure 126/70, pulse 92, temperature 98.3 F (36.8 C), temperature source Oral, resp. rate 18, SpO2 98.00%.There is no height or weight on file to calculate BMI.  General Appearance: Disheveled  Eye Solicitor::  Fair  Speech:  Normal Rate  Volume:  Normal  Mood:  Anxious and Depressed  Affect:  Congruent  Thought Process:  Coherent  Orientation:  Full (Time, Place, and Person)  Thought Content:  Hallucinations: Auditory Visual  Suicidal Thoughts:  No  Homicidal Thoughts:  No  Memory:  Immediate;   Fair Recent;   Fair Remote;   Fair  Judgement:  Poor  Insight:  Fair  Psychomotor Activity:  Decreased  Concentration:  Fair  Recall:  Fair  Akathisia:  No  Handed:  Right  AIMS (if indicated):     Assets:  Physical Health Resilience  Sleep:      Assessment:  Patient presents with depressed mood and affect, answers questions appropriately, cooperative.  He states he feels hopeless and wants help to stop the voices and depression.  Plan:  Admit to [address] hall for psychosis, Dr. Gilmore Laroche assessed the patient and concurs with the treatment plan.  Nanine Means, PMH-NP 02/12/2014 I have personally seen the patient and agreed with the findings and involved in the treatment plan. Thresa Ross, MD

## 2014-02-12 NOTE — Progress Notes (Signed)
Patient ID: Curtis Cobb, male   DOB: [birth year], 43 y.o.   MRN: [id number] Client sleeping; unarousable for evening medications. Unlabored regular breathing noted. Medications returned. Will follow up with oncoming nurse.

## 2014-02-13 DIAGNOSIS — F1210 Cannabis abuse, uncomplicated: Secondary | ICD-10-CM

## 2014-02-13 DIAGNOSIS — F1020 Alcohol dependence, uncomplicated: Secondary | ICD-10-CM

## 2014-02-13 DIAGNOSIS — F259 Schizoaffective disorder, unspecified: Principal | ICD-10-CM

## 2014-02-13 HISTORY — DX: Alcohol dependence, uncomplicated: F10.20

## 2014-02-13 MED ORDER — BENZTROPINE MESYLATE 0.5 MG PO TABS
0.5000 mg | ORAL_TABLET | Freq: Two times a day (BID) | ORAL | Status: DC
  Administered 2014-02-13 – 2014-02-14 (×2): 0.5 mg via ORAL
  Filled 2014-02-13 (×4): qty 1

## 2014-02-13 NOTE — ED Provider Notes (Signed)
Medical screening examination/treatment/procedure(s) were conducted as a shared visit with non-physician practitioner(s) and myself.  I personally evaluated the patient during the encounter.   Please see my separate note.     Candyce Churn III, MD 02/13/14 [phone]

## 2014-02-13 NOTE — [facility] Group Notes (Signed)
[facility] LCSW Group Therapy  02/13/2014 , 12:36 PM   Type of Therapy:  Group Therapy  Participation Level: Did not attend  Summary of Progress/Problems: Today's group focused on the term Diagnosis.  Participants were asked to define the term, and then pronounce whether it is a negative, positive or neutral term.  Curtis Cobb 02/13/2014 , 12:36 PM
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
## 2014-02-13 NOTE — Progress Notes (Signed)
D: Patient denies SI/HI ; patient reports that he is having tremors, nausea, and a headache; patient reports that he is fatigued; asked patient about hallucinations and patient reports " I just woke up so"  A: Monitored q 15 minutes; patient encouraged to attend groups; patient educated about medications; patient given medications per physician orders; patient encouraged to express feelings and/or concerns  R: Patient is flat, depressed and really intelligent;patient able to name off his symptoms of mania in hs interview with the doctor; patient is delayed in his responses but patient able to verbalize what he needs appropriately; patient's interaction with staff and peers is appropriate;  patient is taking medications as prescribed and tolerating medications; patient is not attending any groups and has laid in the bed all day

## 2014-02-13 NOTE — Tx Team (Signed)
  Interdisciplinary Treatment Plan Update   Date Reviewed:  02/13/2014  Time Reviewed:  9:50 AM  Progress in Treatment:   Attending groups: No Participating in groups: No Taking medication as prescribed: Yes  Tolerating medication: Yes Family/Significant other contact made: No  Patient understands diagnosis: Yes AEB asking for help getting back on medication and detox Discussing patient identified problems/goals with staff: Yes  See initial care plan Medical problems stabilized or resolved: Yes Denies suicidal/homicidal ideation: Yes  In tx team Patient has not harmed self or others: Yes  For review of initial/current patient goals, please see plan of care.  Estimated Length of Stay:  3-5 days  Reason for Continuation of Hospitalization: Hallucinations Medication stabilization Withdrawal symptoms  New Problems/Goals identified:  N/A  Discharge Plan or Barriers:   hopes to get into half way house, follow up outpt  Additional Comments:Patient's [facility] admission to [facility], voluntary. Anxiety disorder, schizoaffective disorder, bipolar. Patient has been in [facility] in the past and followed up with [facility] in [place]. Patient has been off his medications recently, visual and auditory hallucinations started again. Patient lost 10 lbs in past 30 days. Stated he has been drinking fifth liquor daily, started drinking 2-3 weeks ago. Stated he was clean 3 and half years. Last drank about 3 days ago. Has been very sad. Mom passed away couple years ago. Mom's birthday 04-Mar-2022and mom died on March 17, 2023.      Attendees:  Signature: Thedore Mins, MD 02/13/2014 9:50 AM   Signature: Richelle Ito, LCSW 02/13/2014 9:50 AM  Signature: Fransisca Kaufmann, NP 02/13/2014 9:50 AM  Signature: Joslyn Devon, RN 02/13/2014 9:50 AM  Signature: Liborio Nixon, RN 02/13/2014 9:50 AM  Signature:  02/13/2014 9:50 AM  Signature:   02/13/2014 9:50 AM  Signature:    Signature:    Signature:     Signature:    Signature:    Signature:      Scribe for Treatment Team:   Richelle Ito, LCSW  02/13/2014 9:50 AM

## 2014-02-13 NOTE — Progress Notes (Signed)
Adult Psychoeducational Group Note  Date:  02/13/2014 Time:  6:26 PM  Group Topic/Focus:  Therapeutic activity  Participation Level:  Did Not Attend  Participation Quality:    Affect:    Cognitive:   Insight:   Engagement in Group:    Modes of Intervention:    Additional Comments:  Pt did not attend.   Marquis Lunch, Akira Perusse 02/13/2014, 6:26 PM
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
## 2014-02-13 NOTE — Progress Notes (Signed)
DAR- Update note D: Pt observed sleeping in bed with eyes closed. RR even and unlabored. No distress noted  .  A: Q 15 minute checks were done for safety.  R: safety maintained on unit.  

## 2014-02-14 LAB — BASIC METABOLIC PANEL
BUN: 11 mg/dL (ref 6–23)
CALCIUM: 9.4 mg/dL (ref 8.4–10.5)
CHLORIDE: 101 meq/L (ref 96–112)
CO2: 28 meq/L (ref 19–32)
Creatinine, Ser: 1.05 mg/dL (ref 0.50–1.35)
GFR calc Af Amer: >90 mL/min (ref >90)
GFR calc non Af Amer: 87 mL/min — ABNORMAL LOW (ref >90)
Glucose, Bld: 89 mg/dL (ref 70–99)
Potassium: 4.3 mEq/L (ref 3.7–5.3)
SODIUM: 138 meq/L (ref 137–147)

## 2014-02-14 MED ORDER — HALOPERIDOL 5 MG PO TABS
5.0000 mg | ORAL_TABLET | Freq: Two times a day (BID) | ORAL | Status: DC
  Administered 2014-02-14 – 2014-02-19 (×10): 5 mg via ORAL
  Filled 2014-02-14 (×10): qty 1
  Filled 2014-02-14: qty 28
  Filled 2014-02-14 (×2): qty 1
  Filled 2014-02-14: qty 28

## 2014-02-14 MED ORDER — ENSURE COMPLETE PO LIQD
237.0000 mL | Freq: Two times a day (BID) | ORAL | Status: DC
  Administered 2014-02-16 – 2014-02-19 (×7): 237 mL via ORAL

## 2014-02-14 MED ORDER — BENZTROPINE MESYLATE 1 MG PO TABS
1.0000 mg | ORAL_TABLET | Freq: Two times a day (BID) | ORAL | Status: DC
  Administered 2014-02-14 – 2014-02-19 (×10): 1 mg via ORAL
  Filled 2014-02-14: qty 28
  Filled 2014-02-14 (×10): qty 1
  Filled 2014-02-14: qty 28
  Filled 2014-02-14 (×2): qty 1

## 2014-02-14 NOTE — Progress Notes (Signed)
D: pt stated that he is paranoid and anxious this morning and he isnt quite sure why. Pt stated he did not sleep well at all last night. Pt stated that he realizes that he does not have the necessary coping skills to deal with what is going on and that he needs to learn some. Pt stated he is a little depressed and feels overwhelmed on unit. Pt to program on [address] hall for groups. Denies si/hi/avh with Clinical research associate, but when speaking with MD, pt stated he is still hearing voices. Denies pain A: vistaril given for anxiety. q 15 min safety checks. Pt in room looking out window R: pt remains safe on unit. Decrease in anxiety.

## 2014-02-14 NOTE — Progress Notes (Signed)
D: Pt denies SI/HI/AVH. Pt stated he had a rough day- depressed . Pt up for a few , then slept the rest of the night.   A: Pt was offered support and encouragement. Pt was given scheduled medications. Pt was encourage to attend groups. Q 15 minute checks were done for safety.   R:Pt attends groups and interacts well with peers and staff. Pt is taking medication. Pt has no complaints at this time.Pt receptive to treatment and safety maintained on unit.

## 2014-02-14 NOTE — [facility] Group Notes (Signed)
[facility] LCSW Aftercare Discharge Planning Group Note   02/14/2014 8:14 AM  Participation Quality:  Engaged  Mood/Affect:  Flat  Depression Rating: 7  Anxiety Rating:  5  Thoughts of Suicide:  No Will you contract for safety?   NA  Current AVH:  Yes  Plan for Discharge/Comments:  Curtis Cobb states he has no back-up plan to the half way house.  When I explained that we needed a plan B, he responded that that his thoughts have not been linear enough to be able to think that clearly.  I explained I have a shelter to offer him, and wondered if he wanted a referral to rehab.  He continued to hedge.  Told him we would have him program on [address] today.  Transportation Means: unk  Supports: girlfriend [maybe]  [place], [place]

## 2014-02-14 NOTE — Progress Notes (Signed)
NUTRITION ASSESSMENT  Pt identified as at risk on the Malnutrition Screen Tool  INTERVENTION: 1. Educated patient on the importance of nutrition and encouraged intake of food and beverages. 2. Discussed weight goals. 3. Supplements: MVI and thiamine daily.  Ensure Complete po BID, each supplement provides 350 kcal and 13 grams of protein   NUTRITION DIAGNOSIS: Unintentional weight loss related to sub-optimal intake as evidenced by pt report.   Goal: Pt to meet >/= 90% of their estimated nutrition needs.  Monitor:  PO intake  Assessment:  Patient admitted with Schizoaffective disorder (auditory/visual hallucination, paranoia,), cannabis use disorder, ETOH use disorder.  Patient reports that he has a fair appetite and is eating well here but decreased intake prior to admit secondary to increased ETOH use.  Reports UBW of 225 lbs with a  25 lb weight loss in the past month.  Comparing patient's stated UBW to current weight, patient has lost 33 lbs (14%).  43 y.o. male  Height: Ht Readings from Last 1 Encounters:  02/12/14 6\' 3"  (1.905 m)    Weight: Wt Readings from Last 1 Encounters:  02/12/14 192 lb (87.091 kg)    Weight Hx: Wt Readings from Last 10 Encounters:  02/12/14 192 lb (87.091 kg)    BMI:  Body mass index is 24 kg/(m^2). Pt meets criteria for normal weight based on current BMI.  Estimated Nutritional Needs: Kcal: 25-30 kcal/kg Protein: > 1 gram protein/kg Fluid: 1 ml/kcal  Diet Order:   Pt is also offered choice of unit snacks mid-morning and mid-afternoon.  Pt is eating as desired.   Lab results and medications reviewed.   02/14/14, RD, LDN Clinical Inpatient Dietitian Pager:  [phone] Weekend and after hours pager:  [phone]

## 2014-02-14 NOTE — Progress Notes (Signed)
[facility] MD Progress Note  02/14/2014 10:57 AM Curtis Cobb  MRN:  [id number] Subjective: "I am still anxious and hearing voices very bad.'' Objective: Patient reports worsening auditory/visual hallucination, paranoia, anxiety and depressive symptoms. He verbalized difficulty sleeping due to anxiety, racing thoughts and excessive worries. Patient states that he is homeless and unemployed. He is worried about his finances and his ability to get money to buy his medications upon discharge. He is on alcohol detox protocol.  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           Alcohol use disorder Axis II: Deferred Axis III:  Past Medical History  Diagnosis Date  . Hypertension    Axis IV: economic problems, housing problems, other psychosocial or environmental problems and problems related to social environment  ADL's:  Intact  Sleep: Fair  Appetite:  Fair  Suicidal Ideation: denies  Homicidal Ideation: denies  AEB (as evidenced by):  Psychiatric Specialty Exam: Physical Exam  Psychiatric: His mood appears anxious. His speech is rapid and/or pressured. He is actively hallucinating. Thought content is paranoid and delusional. Cognition and memory are normal. He expresses impulsivity. He exhibits a depressed mood.    Review of Systems  Constitutional: Negative.   HENT: Negative.   Eyes: Negative.   Respiratory: Negative.   Cardiovascular: Negative.   Gastrointestinal: Negative.   Genitourinary: Negative.   Musculoskeletal: Negative.   Skin: Negative.   Neurological:  Negative.   Endo/Heme/Allergies: Negative.   Psychiatric/Behavioral: Positive for depression, hallucinations and substance abuse. The patient is nervous/anxious and has insomnia.     Blood pressure 125/87, pulse 84, temperature 97.2 F (36.2 C), temperature source Oral, resp. rate 16, height 6' 3"  (1.905 m), weight 87.091 kg (192 lb), SpO2 97.00%.Body mass index is 24 kg/(m^2).  General Appearance: Disheveled  Eye Contact::  Good  Speech:  Clear and Coherent  Volume:  Normal  Mood:  Anxious and Depressed  Affect:  Depressed  Thought Process:  Circumstantial  Orientation:  Full (Time, Place, and Person)  Thought Content:  Hallucinations: Auditory and Paranoid Ideation  Suicidal Thoughts:  No  Homicidal Thoughts:  No  Memory:  Immediate;   Fair Recent;   Fair Remote;   Fair  Judgement:  Poor  Insight:  Lacking  Psychomotor Activity:  Decreased  Concentration:  Fair  Recall:  [employer] of Knowledge:Fair  Language: Good  Akathisia:  No  Handed:  Right  AIMS (if indicated):     Assets:  Communication Skills Desire for Improvement Physical Health  Sleep:  Number of Hours: 6.25   Musculoskeletal: Strength & Muscle Tone: within normal limits Gait & Station: normal Patient leans: N/A  Current Medications: Current Facility-Administered Medications  Medication Dose Route Frequency Provider Last Rate Last Dose  . acetaminophen (TYLENOL) tablet 650 mg  650 mg Oral Q6H PRN Waylan Boga, NP   650 mg at 02/13/14 0827  . benztropine (COGENTIN) tablet 1 mg  1 mg Oral BID Tammey Deeg      . chlordiazePOXIDE (LIBRIUM) capsule 25 mg  25 mg Oral Q6H PRN Benjamine Mola, FNP      .  chlordiazePOXIDE (LIBRIUM) capsule 25 mg  25 mg Oral QID Benjamine Mola, FNP   25 mg at 02/14/14 0750   Followed by  . chlordiazePOXIDE (LIBRIUM) capsule 25 mg  25 mg Oral TID Benjamine Mola, FNP       Followed by  . [START ON 02/15/2014] chlordiazePOXIDE (LIBRIUM) capsule 25 mg  25 mg Oral BH-qamhs Benjamine Mola, FNP       Followed by  . [START ON 02/17/2014] chlordiazePOXIDE (LIBRIUM) capsule 25 mg  25 mg Oral Daily Benjamine Mola, FNP      . haloperidol (HALDOL) tablet 5 mg  5 mg Oral BID Simmone Cape      . hydrOXYzine (ATARAX/VISTARIL) tablet 25 mg  25 mg Oral Q6H PRN Benjamine Mola, FNP   25 mg at 02/14/14 0277  . lithium carbonate capsule 300 mg  300 mg Oral TID WC Waylan Boga, NP   300 mg at 02/14/14 0641  . loperamide (IMODIUM) capsule 2-4 mg  2-4 mg Oral PRN Benjamine Mola, FNP      . magnesium hydroxide (MILK OF MAGNESIA) suspension 30 mL  30 mL Oral Daily PRN Waylan Boga, NP      . multivitamin with minerals tablet 1 tablet  1 tablet Oral Daily Benjamine Mola, FNP   1 tablet at 02/14/14 0750  . ondansetron (ZOFRAN-ODT) disintegrating tablet 4 mg  4 mg Oral Q6H PRN Benjamine Mola, FNP      . thiamine (B-1) injection 100 mg  100 mg Intramuscular Once Benjamine Mola, FNP      . thiamine (VITAMIN B-1) tablet 100 mg  100 mg Oral Daily Benjamine Mola, FNP   100 mg at 02/14/14 0750  . traZODone (DESYREL) tablet 100 mg  100 mg Oral QHS PRN Waylan Boga, NP        Lab Results:  Results for orders placed during the hospital encounter of 02/12/14 (from the past 48 hour(s))  BASIC METABOLIC PANEL     Status: Abnormal   Collection Time    02/14/14  6:20 AM      Result Value Ref Range   Sodium 138  137 - 147 mEq/L   Potassium 4.3  3.7 - 5.3 mEq/L   Chloride 101  96 - 112 mEq/L   CO2 28  19 - 32 mEq/L   Glucose, Bld 89  70 - 99 mg/dL   BUN 11  6 - 23 mg/dL   Creatinine, Ser 1.05  0.50 - 1.35 mg/dL   Calcium 9.4  8.4 - 10.5 mg/dL   GFR calc non Af Amer 87 (*) >90 mL/min   GFR calc Af Amer >90  >90 mL/min   Comment: (NOTE)     The eGFR has been calculated using the CKD EPI equation.     This calculation has not been validated in all clinical situations.     eGFR's persistently <90 mL/min signify possible Chronic Kidney     Disease.     Performed at [facility]     Physical Findings: AIMS: Facial and Oral Movements Muscles of Facial Expression: None, normal Lips and Perioral Area: None, normal Jaw: None, normal Tongue: None, normal,Extremity Movements Upper (arms, wrists, hands, fingers): None, normal Lower (legs, knees, ankles, toes): None, normal, Trunk Movements Neck, shoulders, hips: None, normal, Overall Severity Severity of abnormal movements (highest score from questions above): None, normal Incapacitation due to abnormal movements: None, normal Patient's awareness of abnormal movements (rate  only patient's report): No Awareness, Dental Status Current problems with teeth and/or dentures?: No Does patient usually wear dentures?: No  CIWA:  CIWA-Ar Total: 5 COWS:  COWS Total Score: 5  Treatment Plan Summary: Daily contact with patient to assess and evaluate symptoms and progress in treatment Medication management  Plan:1. Admit for crisis management and stabilization. 2. Medication management to reduce current symptoms to base line and improve the  patient's overall level of functioning: Patient to continue Alcohol detox protocol. Increase Haldol to 97m po BID for psychosis/delusiosn, Cogentin 123mpo bid for EPS prevention. Continues Lithium carbonate 30025mo BID for mood stabilization. 3. Treat health problems as indicated. 4. Develop treatment plan to decrease risk of relapse upon discharge and the need for readmission. 5. Psycho-social education regarding relapse prevention and self care. 6. Health care follow up as needed for medical problems. 7. Restart home medications where appropriate.   Medical Decision Making Problem Points:  Established problem, worsening (2), Review of last therapy session (1) and Review of psycho-social stressors (1) Data Points:  Order Aims Assessment (2) Review or order clinical lab tests (1) Review of medication regiment & side effects (2) Review of new medications or change in dosage (2)  I  certify that inpatient services furnished can reasonably be expected to improve the patient's condition.   AkiCorena PilgrimD 02/14/2014, 10:57 AM

## 2014-02-14 NOTE — Progress Notes (Signed)
Adult Psychoeducational Group Note  Date:  02/14/2014 Time:  9:04 PM  Group Topic/Focus:  Wrap-Up Group:   The focus of this group is to help patients review their daily goal of treatment and discuss progress on daily workbooks.  Participation Level:  Minimal  Participation Quality:  Appropriate  Affect:  Appropriate  Cognitive:  Appropriate  Insight: Limited  Engagement in Group:  Limited  Modes of Intervention:  Support  Additional Comments:  Patient attended and participated in group tonight. He reports that today was tough. He had a personal breakthrough where he was able to use his coping skills. He is still in the learning process regarding his coping skills, however, he hope that with practice he will get better. For his personal development he would like to exercise, read and journal more.  Lita Mains [facility] 02/14/2014, 9:04 PM

## 2014-02-15 MED ORDER — LAMOTRIGINE 25 MG PO TABS
ORAL_TABLET | ORAL | Status: AC
  Filled 2014-02-15: qty 1

## 2014-02-15 MED ORDER — LAMOTRIGINE 25 MG PO TABS
25.0000 mg | ORAL_TABLET | Freq: Every day | ORAL | Status: DC
  Administered 2014-02-15 – 2014-02-19 (×5): 25 mg via ORAL
  Filled 2014-02-15 (×4): qty 1
  Filled 2014-02-15: qty 14
  Filled 2014-02-15: qty 1

## 2014-02-15 NOTE — Progress Notes (Signed)
Patient ID: Curtis Cobb, male   DOB: 09-16-1971, 43 y.o.   MRN: [id number] D: Patient reports increased anxiety and withdrawal symptoms. Pt stated sometimes in group  he's unable to differentiate between the voices in his head and the speaker's voice. Pt denies suicidal /homicidal ideation intent and plan. Pt endorses auditory hallucination without command. Pt attended evening wrap up group and engaged in discussion. Pt denies any needs or concerns. Cooperative with assessment. No acute distressed noted at this time.   A: Met with pt 1:1. Medications administered as prescribed. Writer encouraged pt to discuss feelings. Pt encouraged to come to staff with any questions or concerns.   R: Patient is safe on the unit. He is complaint with medications and denies any adverse reaction. Continue current POC.

## 2014-02-15 NOTE — Progress Notes (Signed)
Patient ID: Curtis Cobb, male   DOB: 1971-05-24, 43 y.o.   MRN: [id number] [facility] MD Progress Note  02/15/2014 10:11 AM Curtis Cobb  MRN:  [id number] Subjective: "I refused to take Lithium this morning, it has been making me more agitated.'' Objective: Patient reports increased moods swings and agitation induced by Lithium. However, he  Is endorsing decreased anxiety, depression and auditory/visual hallucination today.  He verbalized improved sleep due to decreased racing thoughts and  Worries. He denies cravings for drugs and alcohol. He is worries about being homeless and unemployed. He is looking forward to get to a half way house upon discharge.  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           Alcohol use disorder Axis II: Deferred Axis III:  Past Medical History  Diagnosis Date  . Hypertension    Axis IV: economic problems, housing problems, other psychosocial or environmental problems and problems related to social environment  ADL's:  Intact  Sleep: Fair  Appetite:  Fair  Suicidal Ideation: denies  Homicidal Ideation: denies  AEB (as evidenced by):  Psychiatric Specialty Exam: Physical Exam  Psychiatric: His mood appears anxious. His speech is rapid and/or pressured. He is actively hallucinating. Thought content is paranoid and delusional. Cognition and memory are normal. He expresses impulsivity. He exhibits a depressed mood.    Review of Systems  Constitutional: Negative.   HENT: Negative.   Eyes: Negative.   Respiratory: Negative.   Cardiovascular:  Negative.   Gastrointestinal: Negative.   Genitourinary: Negative.   Musculoskeletal: Negative.   Skin: Negative.   Neurological: Negative.   Endo/Heme/Allergies: Negative.   Psychiatric/Behavioral: Positive for depression, hallucinations and substance abuse. The patient is nervous/anxious.     Blood pressure 119/74, pulse 94, temperature 97.4 F (36.3 C), temperature source Oral, resp. rate 18, height 6' 3"  (1.905 m), weight 87.091 kg (192 lb), SpO2 97.00%.Body mass index is 24 kg/(m^2).  General Appearance: fairly groomed  Engineer, water::  Good  Speech:  Clear and Coherent  Volume:  Normal  Mood:  Anxious and Depressed  Affect:  Depressed  Thought Process:  Circumstantial  Orientation:  Full (Time, Place, and Person)  Thought Content:  Hallucinations: Auditory and Paranoid Ideation  Suicidal Thoughts:  No  Homicidal Thoughts:  No  Memory:  Immediate;   Fair Recent;   Fair Remote;   Fair  Judgement:  Poor  Insight:  Lacking  Psychomotor Activity:  Decreased  Concentration:  Fair  Recall:  [employer] of Knowledge:Fair  Language: Good  Akathisia:  No  Handed:  Right  AIMS (if indicated):     Assets:  Communication Skills Desire for Improvement Physical Health  Sleep:  Number of Hours: 6.75   Musculoskeletal: Strength & Muscle Tone: within normal limits Gait & Station: normal Patient leans: N/A  Current Medications: Current Facility-Administered Medications  Medication Dose Route Frequency Provider Last Rate Last Dose  . acetaminophen (TYLENOL) tablet 650 mg  650 mg Oral Q6H PRN Waylan Boga, NP   650 mg at 02/14/14 2111  . benztropine (COGENTIN) tablet 1 mg  1 mg Oral BID Ladaija Dimino  1 mg at 02/15/14 0743  . chlordiazePOXIDE (LIBRIUM) capsule 25 mg  25 mg Oral Q6H PRN Benjamine Mola, FNP   25 mg at 02/14/14 2115  . chlordiazePOXIDE (LIBRIUM) capsule 25 mg  25 mg Oral BH-qamhs Benjamine Mola, FNP       Followed by  . [START ON 02/17/2014] chlordiazePOXIDE  (LIBRIUM) capsule 25 mg  25 mg Oral Daily Benjamine Mola, FNP      . feeding supplement (ENSURE COMPLETE) (ENSURE COMPLETE) liquid 237 mL  237 mL Oral BID BM Darrol Jump, RD      . haloperidol (HALDOL) tablet 5 mg  5 mg Oral BID Kaena Santori   5 mg at 02/15/14 0743  . hydrOXYzine (ATARAX/VISTARIL) tablet 25 mg  25 mg Oral Q6H PRN Benjamine Mola, FNP   25 mg at 02/14/14 5537  . lamoTRIgine (LAMICTAL) tablet 25 mg  25 mg Oral Daily Erika Slaby      . loperamide (IMODIUM) capsule 2-4 mg  2-4 mg Oral PRN Benjamine Mola, FNP      . magnesium hydroxide (MILK OF MAGNESIA) suspension 30 mL  30 mL Oral Daily PRN Waylan Boga, NP      . multivitamin with minerals tablet 1 tablet  1 tablet Oral Daily Benjamine Mola, FNP   1 tablet at 02/15/14 [phone]  . ondansetron (ZOFRAN-ODT) disintegrating tablet 4 mg  4 mg Oral Q6H PRN Benjamine Mola, FNP      . thiamine (B-1) injection 100 mg  100 mg Intramuscular Once Benjamine Mola, FNP      . thiamine (VITAMIN B-1) tablet 100 mg  100 mg Oral Daily Benjamine Mola, FNP   100 mg at 02/15/14 0743  . traZODone (DESYREL) tablet 100 mg  100 mg Oral QHS PRN Waylan Boga, NP   100 mg at 02/14/14 2111    Lab Results:  Results for orders placed during the hospital encounter of 02/12/14 (from the past 48 hour(s))  BASIC METABOLIC PANEL     Status: Abnormal   Collection Time    02/14/14  6:20 AM      Result Value Ref Range   Sodium 138  137 - 147 mEq/L   Potassium 4.3  3.7 - 5.3 mEq/L   Chloride 101  96 - 112 mEq/L   CO2 28  19 - 32 mEq/L   Glucose, Bld 89  70 - 99 mg/dL   BUN 11  6 - 23 mg/dL   Creatinine, Ser 1.05  0.50 - 1.35 mg/dL   Calcium 9.4  8.4 - 10.5 mg/dL   GFR calc non Af Amer 87 (*) >90 mL/min   GFR calc Af Amer >90  >90 mL/min   Comment: (NOTE)     The eGFR has been calculated using the CKD EPI equation.     This calculation has not been validated in all clinical situations.     eGFR's persistently <90 mL/min signify possible Chronic Kidney      Disease.     Performed at [facility]    Physical Findings: AIMS: Facial and Oral Movements Muscles of Facial Expression: None, normal Lips and Perioral Area: None, normal Jaw: None, normal Tongue: None, normal,Extremity Movements Upper (arms, wrists, hands, fingers): None, normal Lower (legs, knees, ankles, toes): None, normal, Trunk Movements Neck, shoulders, hips: None, normal, Overall Severity Severity of abnormal movements (highest score from questions above): None, normal Incapacitation due to abnormal movements: None, normal Patient's awareness of  abnormal movements (rate only patient's report): No Awareness, Dental Status Current problems with teeth and/or dentures?: No Does patient usually wear dentures?: No  CIWA:  CIWA-Ar Total: 1 COWS:  COWS Total Score: 5  Treatment Plan Summary: Daily contact with patient to assess and evaluate symptoms and progress in treatment Medication management  Plan:1. Admit for crisis management and stabilization. 2. Medication management to reduce current symptoms to base line and improve the  patient's overall level of functioning: Patient to continue Alcohol detox protocol. Continue  Haldol to 67m po BID for psychosis/delusiosn, Cogentin 148mpo bid for EPS prevention. Continues Lithium carbonate 30022mo BID for mood stabilization. 3. Treat health problems as indicated. 4. Develop treatment plan to decrease risk of relapse upon discharge and the need for readmission. 5. Psycho-social education regarding relapse prevention and self care. 6. Health care follow up as needed for medical problems. 7. Restart home medications where appropriate.   Medical Decision Making Problem Points:  Established problem, improving (1), Review of last therapy session (1) and Review of psycho-social stressors (1) Data Points:  Order Aims Assessment (2) Review or order clinical lab tests (1) Review of medication regiment & side effects  (2) Review of new medications or change in dosage (2)  I certify that inpatient services furnished can reasonably be expected to improve the patient's condition.   AkiCorena PilgrimD 02/15/2014, 10:11 AM

## 2014-02-15 NOTE — Progress Notes (Signed)
D: Patient denies SI/HI and visual hallucinations. The patient is positive for auditory hallucinations. The patient has a depressed mood and affect. The patient is attending groups and interacting appropriately within the milieu. The patient is exhibiting no withdrawal symptoms at this time. Patient refused morning lithium.  A: Patient given emotional support from RN. Patient encouraged to come to staff with concerns and/or questions. Patient's medication routine continued. Patient's orders and plan of care reviewed. MD made aware of patient's medication refusal.  R: Patient remains cooperative. Will continue to monitor patient q15 minutes for safety.

## 2014-02-15 NOTE — Progress Notes (Signed)
D:  Pt +ve Non command AH. Pt denies SI/HI/VH. Pt is pleasant and cooperative. Pt stated he was tired today and forwarded little information. Pt was sleep in bed upon approach.   A: Pt was offered support and encouragement. Pt was given scheduled medications. Pt was encourage to attend groups. Q 15 minute checks were done for safety.   R: Pt is taking medication. Pt has no complaints.Pt receptive to treatment and safety maintained on unit.

## 2014-02-15 NOTE — [facility] Group Notes (Signed)
[facility] LCSW Group Therapy  02/15/2014 2:24 PM  Type of Therapy:  Group Therapy  Participation Level:  Active  Participation Quality:  Attentive  Affect:  Appropriate  Cognitive:  Alert and Oriented  Insight:  Engaged  Engagement in Therapy:  Engaged  Modes of Intervention:  Confrontation, Discussion, Education, Exploration, Problem-solving, Rapport Building, Socialization and Support  Summary of Progress/Problems:  Finding Balance in Life. Today's group focused on defining balance in one's own words, identifying things that can knock one off balance, and exploring healthy ways to maintain balance in life. Group members were asked to provide an example of a time when they felt off balance, describe how they handled that situation,and process healthier ways to regain balance in the future. Group members were asked to share the most important tool for maintaining balance that they learned while at [facility] and how they plan to apply this method after discharge. Jahari was attentive and engaged throughout today's therapy session. Arden shared that for him, balance meant, "establishing a routine, being consistent and self aware, making time for himself, and taking care of both mental and physical health." Patryk shows progress in the group setting and improving insight AEB is ability to process how self discipline and establishing a daily routine can help him achieve a sense of balance in life. "I plan to hike, get back into nature, and start cycling again." Traver acknowledged the importance of being in nature and meditating.    Smart, Shaine Newmark  LCSWA   02/15/2014, 2:24 PM

## 2014-02-15 NOTE — Progress Notes (Signed)
Did not attend group 

## 2014-02-16 MED ORDER — IBUPROFEN 200 MG PO TABS
600.0000 mg | ORAL_TABLET | Freq: Four times a day (QID) | ORAL | Status: DC | PRN
  Administered 2014-02-16 – 2014-02-19 (×6): 600 mg via ORAL
  Filled 2014-02-16 (×6): qty 3

## 2014-02-16 NOTE — Progress Notes (Signed)
Patient ID: Curtis Cobb, male   DOB: December 19, 1971, 43 y.o.   MRN: [id number] D. Patient presents with depressed mood, affect flat. Patient reports '' I'm not feeling so great, I still feel like my head and body are in a fog, sluggish, but at the same time I'm having restless thoughts. And My back is hurting pretty bad this morning '' Patient has been cooperative on the unit, isolative to room but attending unit programming and meals. A. Medications given as ordered. Discussed above information with Dr. Jannifer Franklin. Support and encouragement provided. Encouraged hydration and gatorade provided. R. Patient denies any further acute concerns. Will continue to monitor q 15 minutes for safety.

## 2014-02-16 NOTE — [facility] Group Notes (Signed)
[facility] LCSW Aftercare Discharge Planning Group Note   02/16/2014 9:33 AM  Participation Quality:  Did not attend    [place]

## 2014-02-16 NOTE — Progress Notes (Signed)
[facility] Group Notes:  (Nursing/MHT/Case Management/Adjunct)  Date:  02/16/2014  Time:  8:00 p.m.   Type of Therapy:  Psychoeducational Skills  Participation Level:  Active  Participation Quality:  Appropriate  Affect:  Appropriate  Cognitive:  Appropriate  Insight:  Good  Engagement in Group:  Engaged  Modes of Intervention:  Education  Summary of Progress/Problems: The patient described his day as having been pretty good overall. The patient states that he is pleased with the changes in his medication thus far and is feeling better. In terms of the theme of the day, he indicated that his relapse prevention will involve changing his thinking and working on not having "unhealthy thinking" or negative thinking.   Hazle Coca S 02/16/2014, 9:34 PM

## 2014-02-16 NOTE — [facility] Group Notes (Signed)
[facility] LCSW Group Therapy  02/16/2014  1:05 PM  Type of Therapy:  Group therapy  Participation Level:    Participation Quality:  Attentive  Affect:  Flat  Cognitive:  Oriented  Insight:  Limited  Engagement in Therapy:  Limited  Modes of Intervention:  Discussion, Socialization  Summary of Progress/Problems:  Chaplain was here to lead a group on themes of hope and courage.Found in bed at group time.  Told me his back hurt too much to come to group.  I responded that if he needs to stay in bed for his back, he will not make it in the half way house as he will have to get up and work everyday.  I left to gather other patients, and found him in group room when I returned.  "There's many times in life that I have not cared.  That came about because I gave to others without taking into consideration my own needs.  It's not that I wanted to stop caring, I just became numb."  Was able and willing to participate without malice after being complelled to come to group.  Actually shared at the end that he knows that the advice that he gives to others is often helpful to himself if he just pauses and thinks about applying it for himself.  Curtis Cobb 02/16/2014 1:32 PM

## 2014-02-16 NOTE — Tx Team (Signed)
  Interdisciplinary Treatment Plan Update   Date Reviewed:  02/16/2014  Time Reviewed:  9:35 AM  Progress in Treatment:   Attending groups: Sporadically Participating in groups: Yes  When there Taking medication as prescribed: Yes  Tolerating medication: Yes Family/Significant other contact made: Yes  Patient understands diagnosis: Yes  Discussing patient identified problems/goals with staff: Yes Medical problems stabilized or resolved: Yes Denies suicidal/homicidal ideation: Yes Patient has not harmed self or others: Yes  For review of initial/current patient goals, please see plan of care.  Estimated Length of Stay:  D/C Monday  Reason for Continuation of Hospitalization: Depression Medication stabilization Withdrawal symptoms  New Problems/Goals identified:  N/A  Discharge Plan or Barriers:   Plan A:  Half way house  Plan B:  Shelter  Additional Comments:  Curtis Cobb is still completing librium detox protocol.  He is continuing to complain of mental health symptoms.  Interview with half way house today.  Yesterday Lithium changed to Lamictal due to c/o side effects.  Attendees:  Signature: Thedore Mins, MD 02/16/2014 9:35 AM   Signature: Richelle Ito, LCSW 02/16/2014 9:35 AM  Signature: Fransisca Kaufmann, NP 02/16/2014 9:35 AM  Signature: Joslyn Devon, RN 02/16/2014 9:35 AM  Signature: Liborio Nixon, RN 02/16/2014 9:35 AM  Signature:  02/16/2014 9:35 AM  Signature:   02/16/2014 9:35 AM  Signature:    Signature:    Signature:    Signature:    Signature:    Signature:      Scribe for Treatment Team:   Richelle Ito, LCSW  02/16/2014 9:35 AM

## 2014-02-16 NOTE — Progress Notes (Signed)
Patient ID: Curtis Cobb, male   DOB: 03-19-1971, 43 y.o.   MRN: [id number] [facility] MD Progress Note  02/16/2014 11:23 AM Curtis Cobb  MRN:  [id number] Subjective: Patient states " overall I am doing better except for my back pain and occasional anxiety.'' Objective: Patient reports decreased anxiety, depressive symptoms, racing thoughts,moods swings and agitation. He denies auditory/visual hallucination or suicidal thoughts today.today. He verbalized improved sleep due to decreased racing thoughts and  Worries. He also denies cravings for drugs and alcohol. He is happy that he got accepted to a half way house, he will be picked up from here on Monday.  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           Alcohol use disorder Axis II: Deferred Axis III:  Past Medical History  Diagnosis Date  . Hypertension    Axis IV: economic problems, housing problems, other psychosocial or environmental problems and problems related to social environment  ADL's:  Intact  Sleep: Fair  Appetite:  Fair  Suicidal Ideation: denies  Homicidal Ideation: denies  AEB (as evidenced by):  Psychiatric Specialty Exam: Physical Exam  Psychiatric: His speech is normal. His mood appears anxious. Thought content is paranoid. Cognition and memory are normal. He expresses impulsivity.    Review of Systems  Constitutional: Negative.   HENT: Negative.   Eyes: Negative.   Respiratory: Negative.   Cardiovascular: Negative.   Gastrointestinal: Negative.   Genitourinary: Negative.   Musculoskeletal:  Negative.   Skin: Negative.   Neurological: Negative.   Endo/Heme/Allergies: Negative.   Psychiatric/Behavioral: Positive for depression and substance abuse. The patient is nervous/anxious.     Blood pressure 118/76, pulse 74, temperature 97.6 F (36.4 C), temperature source Oral, resp. rate 16, height 6\' 3"  (1.905 m), weight 87.091 kg (192 lb), SpO2 97.00%.Body mass index is 24 kg/(m^2).  General Appearance: fairly groomed  ::  Good  Speech:  Clear and Coherent  Volume:  Normal  Mood:  Anxious and Depressed  Affect:  Depressed  Thought Process:  Circumstantial  Orientation:  Full (Time, Place, and Person)  Thought Content:  Hallucinations: Auditory and Paranoid Ideation  Suicidal Thoughts:  No  Homicidal Thoughts:  No  Memory:  Immediate;   Fair Recent;   Fair Remote;   Fair  Judgement:  Poor  Insight:  Lacking  Psychomotor Activity:  Decreased  Concentration:  Fair  Recall:  [id number] of Knowledge:Fair  Language: Good  Akathisia:  No  Handed:  Right  AIMS (if indicated):     Assets:  Communication Skills Desire for Improvement Physical Health  Sleep:  Number of Hours: 6.75   Musculoskeletal: Strength & Muscle Tone: within normal limits Gait & Station: normal Patient leans: N/A  Current Medications: Current Facility-Administered Medications  Medication Dose Route Frequency Provider Last Rate Last Dose  . acetaminophen (TYLENOL) tablet 650 mg  650 mg Oral Q6H PRN [employer], NP   650 mg at 02/15/14 1103  . benztropine (COGENTIN) tablet 1 mg  1 mg Oral BID Atul Delucia   1 mg at 02/16/14 0753  . [START ON 02/17/2014] chlordiazePOXIDE (LIBRIUM) capsule  25 mg  25 mg Oral Daily Beau Fanny, FNP      . feeding supplement (ENSURE COMPLETE) (ENSURE COMPLETE) liquid 237 mL  237 mL Oral BID BM Jeoffrey Massed, RD   237 mL at 02/16/14 0958  . haloperidol (HALDOL) tablet 5 mg  5 mg Oral BID Docia Klar   5 mg at 02/16/14 0753  . ibuprofen (ADVIL,MOTRIN)  tablet 600 mg  600 mg Oral Q6H PRN Cael Worth   600 mg at 02/16/14 1004  . lamoTRIgine (LAMICTAL) tablet 25 mg  25 mg Oral Daily Emony Dormer   25 mg at 02/16/14 0753  . magnesium hydroxide (MILK OF MAGNESIA) suspension 30 mL  30 mL Oral Daily PRN Nanine Means, NP      . multivitamin with minerals tablet 1 tablet  1 tablet Oral Daily Beau Fanny, FNP   1 tablet at 02/16/14 0753  . thiamine (B-1) injection 100 mg  100 mg Intramuscular Once Beau Fanny, FNP      . thiamine (VITAMIN B-1) tablet 100 mg  100 mg Oral Daily Beau Fanny, FNP   100 mg at 02/16/14 0753  . traZODone (DESYREL) tablet 100 mg  100 mg Oral QHS PRN Nanine Means, NP   100 mg at 02/15/14 2138    Lab Results:  No results found for this or any previous visit (from the past 48 hour(s)).  Physical Findings: AIMS: Facial and Oral Movements Muscles of Facial Expression: None, normal Lips and Perioral Area: None, normal Jaw: None, normal Tongue: None, normal,Extremity Movements Upper (arms, wrists, hands, fingers): None, normal Lower (legs, knees, ankles, toes): None, normal, Trunk Movements Neck, shoulders, hips: None, normal, Overall Severity Severity of abnormal movements (highest score from questions above): None, normal Incapacitation due to abnormal movements: None, normal Patient's awareness of abnormal movements (rate only patient's report): No Awareness, Dental Status Current problems with teeth and/or dentures?: No Does patient usually wear dentures?: No  CIWA:  CIWA-Ar Total: 0 COWS:  COWS Total Score: 5  Treatment Plan Summary: Daily contact with patient to assess and evaluate symptoms and progress in treatment Medication management  Plan:1. Admit for crisis management and stabilization. 2. Medication management to reduce current symptoms to base line and improve the  patient's overall level of functioning: Patient to continue Alcohol detox protocol. Continue  Haldol to 5mg  po BID for  psychosis/delusiosn, Cogentin 1mg  po bid for EPS prevention. Continues Lamictal 25mg  po daily for mood stabilization. 3. Treat health problems as indicated. 4. Develop treatment plan to decrease risk of relapse upon discharge and the need for readmission. 5. Psycho-social education regarding relapse prevention and self care. 6. Health care follow up as needed for medical problems. 7. Discharge to Half way house on 02/19/14   Medical Decision Making Problem Points:  Established problem, improving (1), Review of last therapy session (1) and Review of psycho-social stressors (1) Data Points:  Order Aims Assessment (2) Review or order clinical lab tests (1) Review of medication regiment & side effects (2) Review of new medications or change in dosage (2)  I certify that inpatient services furnished can reasonably be expected to improve the patient's condition.   , MD 02/16/2014, 11:23 AM

## 2014-02-16 NOTE — [facility] Suicide Risk Assessment (Signed)
[facility] INPATIENT:  Family/Significant Other Suicide Prevention Education  Suicide Prevention Education:  Education Completed;   [place], Friend [phone] has been identified by the patient as the family member/significant other with whom the patient will be residing, and identified as the person(s) who will aid the patient in the event of a mental health crisis (suicidal ideations/suicide attempt).  With written consent from the patient, the family member/significant other has been provided the following suicide prevention education, prior to the and/or following the discharge of the patient.  The suicide prevention education provided includes the following:  Suicide risk factors  Suicide prevention and interventions  National Suicide Hotline telephone number  [facility] assessment telephone number  [facility] Emergency Assistance 911  [facility] and/or Residential Mobile Crisis Unit telephone number  Request made of family/significant other to:  Remove weapons (e.g., guns, rifles, knives), all items previously/currently identified as safety concern.    Remove drugs/medications (over-the-counter, prescriptions, illicit drugs), all items previously/currently identified as a safety concern.  The family member/significant other verbalizes understanding of the suicide prevention education information provided.  The family member/significant other agrees to remove the items of safety concern listed above.  Simona Huh 02/16/2014, 10:08 AM

## 2014-02-17 DIAGNOSIS — F319 Bipolar disorder, unspecified: Secondary | ICD-10-CM

## 2014-02-17 MED ORDER — HYDROXYZINE HCL 25 MG PO TABS
25.0000 mg | ORAL_TABLET | ORAL | Status: DC | PRN
  Administered 2014-02-17 – 2014-02-19 (×4): 25 mg via ORAL
  Filled 2014-02-17 (×4): qty 1

## 2014-02-17 NOTE — Progress Notes (Signed)
Patient ID: Curtis Cobb, male   DOB: [birth year], 43 y.o.   MRN: [id number] D. The patient has a neutral mood and affect. Stated that he is going to a halfway house on Monday and is pleased that he has somewhere to go. He expressed that it is his poor judgement rather than the ETOH that gets him in trouble all the time. Stated that he had been sober three years and started drinking again a few months ago because he was depressed. Reports intermittent back pain of unknown origin. A. Met with patient 1:1 to assess. Encourage to attend evening group. Discussed recovery plan for discharge to prevent relapse. Reviewed and administered HS medication and PRN medication for back pain. R. The patient attended and actively participated in evening group. Stated that he is planning to go back to [place] and contact his sponsor. Compliant with medication. Relief noted from Ibuprofen.

## 2014-02-17 NOTE — Progress Notes (Signed)
Patient ID: Curtis Cobb, male   DOB: 10/04/1971, 43 y.o.   MRN: [id number] D. Patient presents with neutral mood, affect congruent. Patient reports '' I'm doing really well. I slept really well last night and I'm feeling fine. The lamictal is really helping my anxiety '' Patient reports he is looking forward to discharge on Monday to halfway house . He denies any acute concerns at this time. A. Medications given as ordered. Discussed above information with A. Nwoko NP.  Support and encouragement provided. Encouraged hydration and gatorade provided. R. Patient denies any further acute concerns. Will continue to monitor q 15 minutes for safety.

## 2014-02-17 NOTE — [facility] Group Notes (Signed)
[facility] Group Notes:  (Nursing/MHT/Case Management/Adjunct)  Date:  02/17/2014  Time: 0915  Type of Therapy:  Psychoeducational Skills  Participation Level:  Active  Participation Quality:  Appropriate  Affect:  Appropriate  Cognitive:  Appropriate  Insight:  Appropriate  Engagement in Group:  Engaged  Modes of Intervention:  Discussion, Education and Exploration  Summary of Progress/Problems: Psychoeducational skill review with a focus on healthy coping skills to use in areas that have been historically problematic for the patient. Benno states he needs to work on anger, and not being reactive to help him cope.  Malva Limes 02/17/2014, 10:11 AM

## 2014-02-17 NOTE — [facility] Group Notes (Signed)
[facility] Group Notes:  (Clinical Social Work)  02/17/2014  11:00-11:45AM  Summary of Progress/Problems:   The main focus of today's process group was for the patient to identify ways in which they have in the past sabotaged their own recovery and reasons they may have done this/what they received from doing it.  We then worked to identify a specific plan to avoid doing this when discharged from the hospital for this admission.  The patient expressed early in group that he has been hospitalized 3 times now in the last 18 months, which is "two too many."  He stated he has not managed his medications previously, has stopped taking them, and then feels well for awhile but gradually starts feeling symptoms return, and resorts to drinking.  This time, he had a beer, and in less than a month that had escalated rapidly to drinking 1/2 gallon daily.  The patient was very interactive and engaged, but another patient who has previously interrupted groups he has been in did so again, and he left abruptly.  A nursing student did attempt to get him to return, but he stated he felt better staying in his room.  Type of Therapy:  Group Therapy - Process  Participation Level:  Active  Participation Quality:  Attentive and Sharing  Affect:  Angry and Blunted  Cognitive:  Alert, Appropriate and Oriented  Insight:  Developing/Improving  Engagement in Therapy:  Developing/Improving  Modes of Intervention:  Clarification, Education, Exploration, Discussion  Curtis Mantle, LCSW 02/17/2014, 12:03 PM

## 2014-02-17 NOTE — Progress Notes (Signed)
Writer observed patient up in the dayroom playing cards with his room mate. Writer spoke with him 1:1 and he reports that his day has been good but he has been a little nervous about discharging on Monday to a halfway house. He reports that the doctor ordered something for him to take for anxiety. He received a prn of visteril and returned to dayroom. Support and encouragement offered, safety maintained on unit with 15 min checks.

## 2014-02-17 NOTE — Progress Notes (Signed)
Patient ID: Curtis Cobb, male   DOB: October 10, 1971, 43 y.o.   MRN: [id number] Patient ID: Curtis Cobb, male   DOB: [birth year], 43 y.o.   MRN: [id number] [facility] MD Progress Note  02/17/2014 4:01 PM [name]  MRN:  [id number] Subjective: Patient states "Today is the first day that I did not have tremors. It feels really good.  My mood is positive today. However, I'm still feeling very anxious. The voices are not all gone. I still hear them off and on"  Objective: Patient reports although he is not having tremors, he continues to feel very anxious. He says he will be discharged on Monday, and will go to a half way house. He asked for something for anxiety. Denies any SIHI.  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           Alcohol use disorder Axis II: Deferred Axis III:  Past Medical History  Diagnosis Date  . Hypertension    Axis IV: economic problems, housing problems, other psychosocial or environmental problems and problems related to social environment  ADL's:  Intact  Sleep: Fair  Appetite:  Fair  Suicidal Ideation: denies  Homicidal Ideation: denies  AEB (as evidenced by):  Psychiatric Specialty Exam: Physical Exam  Psychiatric: His speech is normal. His mood appears anxious. Thought content is paranoid. Cognition and memory are normal. He expresses impulsivity.    Review of Systems  Constitutional: Negative.   HENT: Negative.   Eyes: Negative.   Respiratory: Negative.   Cardiovascular: Negative.   Gastrointestinal: Negative.   Genitourinary: Negative.    Musculoskeletal: Negative.   Skin: Negative.   Neurological: Negative.   Endo/Heme/Allergies: Negative.   Psychiatric/Behavioral: Positive for depression and substance abuse. The patient is nervous/anxious.     Blood pressure 109/67, pulse 92, temperature 97.9 F (36.6 C), temperature source Oral, resp. rate 16, height 6\' 3"  (1.905 m), weight 87.091 kg (192 lb), SpO2 97.00%.Body mass index is 24 kg/(m^2).  General Appearance: fairly groomed  ::  Good  Speech:  Clear and Coherent  Volume:  Normal  Mood:  Anxious and Depressed  Affect:  Depressed  Thought Process:  Circumstantial  Orientation:  Full (Time, Place, and Person)  Thought Content:  Hallucinations: Auditory and Paranoid Ideation  Suicidal Thoughts:  No  Homicidal Thoughts:  No  Memory:  Immediate;   Fair Recent;   Fair Remote;   Fair  Judgement:  Poor  Insight:  Lacking  Psychomotor Activity:  Decreased  Concentration:  Fair  Recall:  [id number] of Knowledge:Fair  Language: Good  Akathisia:  No  Handed:  Right  AIMS (if indicated):     Assets:  Communication Skills Desire for Improvement Physical Health  Sleep:  Number of Hours: 6.5   Musculoskeletal: Strength & Muscle Tone: within normal limits Gait & Station: normal Patient leans: N/A  Current Medications: Current Facility-Administered Medications  Medication Dose Route Frequency Provider Last Rate Last Dose  . acetaminophen (TYLENOL) tablet 650 mg  650 mg Oral Q6H PRN [employer], NP   650 mg at 02/15/14 1103  . benztropine (COGENTIN) tablet 1 mg  1 mg Oral BID Mojeed Akintayo   1 mg at 02/17/14 0813  . feeding supplement (ENSURE COMPLETE) (ENSURE COMPLETE) liquid 237 mL  237 mL Oral BID BM Jeoffrey Massed, RD   237 mL at 02/17/14 1406  . haloperidol (HALDOL) tablet 5 mg  5 mg Oral BID Mojeed Akintayo   5 mg at 02/17/14 0813  . hydrOXYzine (ATARAX/VISTARIL) tablet 25 mg  25 mg Oral Q4H PRN Sanjuana Kava, NP   25 mg at 02/17/14 1251  .  ibuprofen (ADVIL,MOTRIN) tablet 600 mg  600 mg Oral Q6H PRN Mojeed Akintayo   600 mg at 02/17/14 0813  . lamoTRIgine (LAMICTAL) tablet 25 mg  25 mg Oral Daily Mojeed Akintayo   25 mg at 02/17/14 0813  . magnesium hydroxide (MILK OF MAGNESIA) suspension 30 mL  30 mL Oral Daily PRN Nanine Means, NP      . multivitamin with minerals tablet 1 tablet  1 tablet Oral Daily Beau Fanny, FNP   1 tablet at 02/17/14 0813  . thiamine (B-1) injection 100 mg  100 mg Intramuscular Once Beau Fanny, FNP      . thiamine (VITAMIN B-1) tablet 100 mg  100 mg Oral Daily Beau Fanny, FNP   100 mg at 02/17/14 0813  . traZODone (DESYREL) tablet 100 mg  100 mg Oral QHS PRN Nanine Means, NP   100 mg at 02/16/14 2127    Lab Results:  No results found for this or any previous visit (from the past 48 hour(s)).  Physical Findings: AIMS: Facial and Oral Movements Muscles of Facial Expression: None, normal Lips and Perioral Area: None, normal Jaw: None, normal Tongue: None, normal,Extremity Movements Upper (arms, wrists, hands, fingers): None, normal Lower (legs, knees, ankles, toes): None, normal, Trunk Movements Neck, shoulders, hips: None, normal, Overall Severity Severity of abnormal movements (highest score from questions above): None, normal Incapacitation due to abnormal movements: None, normal Patient's awareness of abnormal movements (rate only patient's report): No Awareness, Dental Status Current problems with teeth and/or dentures?: No Does patient usually wear dentures?: No  CIWA:  CIWA-Ar Total: 2 COWS:  COWS Total Score: 5  Treatment Plan Summary: Daily contact with patient to assess and evaluate symptoms and progress in treatment Medication management  Plan:1. Admit for crisis management and stabilization. 2. Medication management to reduce current symptoms to base line and improve the  patient's overall level of functioning: Patient to continue Alcohol detox protocol. Continue  Haldol  to 5mg  po BID for psychosis/delusiosn, Cogentin 1mg  po bid for EPS prevention. Continues Lamictal 25mg  po daily for mood stabilization. 3. Treat health problems as indicated. 4. Develop treatment plan to decrease risk of relapse upon discharge and the need for readmission. 5. Psycho-social education regarding relapse prevention and self care. 6. Health care follow up as needed for medical problems. 7. Discharge to Half way house on 02/19/14   Medical Decision Making Problem Points:  Established problem, improving (1), Review of last therapy session (1) and Review of psycho-social stressors (1) Data Points:  Order Aims Assessment (2) Review or order clinical lab tests (1) Review of medication regiment & side effects (2) Review of new medications or change in dosage (2)  I certify that inpatient services furnished can reasonably be expected to improve the patient's condition.   I, PMHNP_BC 02/17/2014, 4:01 PM

## 2014-02-17 NOTE — Progress Notes (Addendum)
The focus of this group is to help patients review their daily goal of treatment and discuss progress on daily workbooks. Pt attended the evening group session and responded to all discussion prompts from the Writer. Pt reported having had a good day on the unit, the highlight of which was getting a new roommate that he got along with. Pt also mentioned looking forward to a possible discharge Monday to a halfway house. "I'm excited but also a little anxious." Pt's only additional request from Nursing Staff this evening was to shave, which the Writer promised to take care of following group. Pt's affect was appropriate.

## 2014-02-17 NOTE — [facility] Group Notes (Signed)
[facility] Group Notes:  (Nursing/MHT/Case Management/Adjunct)  Date:  02/17/2014  Time:  0900  Type of Therapy:  SELF INVENTORY REVIEW WITH RN   Participation Level:  Active  Participation Quality:  Appropriate  Affect:  Appropriate  Cognitive:  Alert and Appropriate  Insight:  Appropriate  Engagement in Group:  Engaged  Modes of Intervention:  Discussion, Education and Exploration  Summary of Progress/Problems: self inventory review with RN - pt was engaged and sharing during group.   Malva Limes 02/17/2014, 10:09 AM

## 2014-02-18 NOTE — Progress Notes (Signed)
Patient ID: Curtis Cobb, male   DOB: 24-Jan-1971, 43 y.o.   MRN: [id number] Patient ID: Curtis Cobb, male   DOB: 1971/12/26, 43 y.o.   MRN: [id number] Patient ID: Curtis Cobb, male   DOB: [birth year], 43 y.o.   MRN: [id number] [facility] MD Progress Note  02/18/2014 4:46 PM Curtis Cobb  MRN:  [id number]  Subjective: Patient states "I feel very positive about leaving in the morning".  Objective: Patient reports that he will be discharged in the morning to go to a half-way house. He is looking forward to being there. He continues to take his medications as ordered and participating in group sessions.   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           Alcohol use disorder Axis II: Deferred Axis III:  Past Medical History  Diagnosis Date  . Hypertension    Axis IV: economic problems, housing problems, other psychosocial or environmental problems and problems related to social environment  ADL's:  Intact  Sleep: Fair  Appetite:  Fair  Suicidal Ideation: denies  Homicidal Ideation: denies  AEB (as evidenced by):  Psychiatric Specialty Exam: Physical Exam  Psychiatric: His speech is normal. His mood appears anxious. Thought content is paranoid. Cognition and memory are normal. He expresses impulsivity.    Review of Systems  Constitutional: Negative.   HENT: Negative.   Eyes: Negative.   Respiratory: Negative.   Cardiovascular: Negative.   Gastrointestinal: Negative.   Genitourinary: Negative.   Musculoskeletal: Negative.   Skin: Negative.   Neurological: Negative.    Endo/Heme/Allergies: Negative.   Psychiatric/Behavioral: Positive for depression and substance abuse. The patient is nervous/anxious.     Blood pressure 120/74, pulse 100, temperature 97.8 F (36.6 C), temperature source Oral, resp. rate 16, height 6\' 3"  (1.905 m), weight 87.091 kg (192 lb), SpO2 97.00%.Body mass index is 24 kg/(m^2).  General Appearance: fairly groomed  ::  Good  Speech:  Clear and Coherent  Volume:  Normal  Mood:  Anxious and Depressed  Affect:  Depressed  Thought Process:  Circumstantial  Orientation:  Full (Time, Place, and Person)  Thought Content:  Hallucinations: Auditory and Paranoid Ideation  Suicidal Thoughts:  No  Homicidal Thoughts:  No  Memory:  Immediate;   Fair Recent;   Fair Remote;   Fair  Judgement:  Poor  Insight:  Lacking  Psychomotor Activity:  Decreased  Concentration:  Fair  Recall:  002.002.002.002 of Knowledge:Fair  Language: Good  Akathisia:  No  Handed:  Right  AIMS (if indicated):     Assets:  Communication Skills Desire for Improvement Physical Health  Sleep:  Number of Hours: 6.5   Musculoskeletal: Strength & Muscle Tone: within normal limits Gait & Station: normal Patient leans: N/A  Current Medications: Current Facility-Administered Medications  Medication Dose Route Frequency Provider Last Rate Last Dose  . acetaminophen (TYLENOL) tablet 650 mg  650 mg Oral Q6H PRN [employer], NP   650 mg at 02/15/14 1103  . benztropine (COGENTIN) tablet 1 mg  1 mg Oral BID Mojeed Akintayo   1 mg at 02/18/14 0747  . feeding supplement (  ENSURE COMPLETE) (ENSURE COMPLETE) liquid 237 mL  237 mL Oral BID BM Jeoffrey Massed, RD   237 mL at 02/18/14 1400  . haloperidol (HALDOL) tablet 5 mg  5 mg Oral BID Mojeed Akintayo   5 mg at 02/18/14 0746  . hydrOXYzine (ATARAX/VISTARIL) tablet 25 mg  25 mg Oral Q4H PRN Sanjuana Kava, NP   25 mg at 02/18/14 1138  . ibuprofen (ADVIL,MOTRIN) tablet 600 mg  600 mg Oral Q6H PRN Mojeed Akintayo   600  mg at 02/18/14 1138  . lamoTRIgine (LAMICTAL) tablet 25 mg  25 mg Oral Daily Mojeed Akintayo   25 mg at 02/18/14 0746  . magnesium hydroxide (MILK OF MAGNESIA) suspension 30 mL  30 mL Oral Daily PRN Nanine Means, NP      . multivitamin with minerals tablet 1 tablet  1 tablet Oral Daily Beau Fanny, FNP   1 tablet at 02/18/14 0747  . thiamine (B-1) injection 100 mg  100 mg Intramuscular Once Beau Fanny, FNP      . thiamine (VITAMIN B-1) tablet 100 mg  100 mg Oral Daily Beau Fanny, FNP   100 mg at 02/18/14 0747  . traZODone (DESYREL) tablet 100 mg  100 mg Oral QHS PRN Nanine Means, NP   100 mg at 02/17/14 2139    Lab Results:  No results found for this or any previous visit (from the past 48 hour(s)).  Physical Findings: AIMS: Facial and Oral Movements Muscles of Facial Expression: None, normal Lips and Perioral Area: None, normal Jaw: None, normal Tongue: None, normal,Extremity Movements Upper (arms, wrists, hands, fingers): None, normal Lower (legs, knees, ankles, toes): None, normal, Trunk Movements Neck, shoulders, hips: None, normal, Overall Severity Severity of abnormal movements (highest score from questions above): None, normal Incapacitation due to abnormal movements: None, normal Patient's awareness of abnormal movements (rate only patient's report): No Awareness, Dental Status Current problems with teeth and/or dentures?: No Does patient usually wear dentures?: No  CIWA:  CIWA-Ar Total: 0 COWS:  COWS Total Score: 5  Treatment Plan Summary: Daily contact with patient to assess and evaluate symptoms and progress in treatment Medication management  Plan:.Review of chart, vital signs, medications, and notes. 1-Individual and group therapy 2-Medication management for depression and anxiety:  Medications reviewed with the patient, continue Hydroxyzine 25 mg prn for anxiety 3-Coping skills for depression, anxiety, and substance dependency 4-Continue crisis  stabilization and management 5-Address health issues--monitoring vital signs, stable 6-Treatment plan in progress to prevent relapse of depression, substance abuse, and anxiety 7. Discharge to Half way house on am.  Medical Decision Making Problem Points:  Established problem, improving (1), Review of last therapy session (1) and Review of psycho-social stressors (1) Data Points:  Order Aims Assessment (2) Review or order clinical lab tests (1) Review of medication regiment & side effects (2) Review of new medications or change in dosage (2)  I certify that inpatient services furnished can reasonably be expected to improve the patient's condition.   Sanjuana Kava, PMHNP_BC 02/18/2014, 4:46 PM Agree with assessment and plan Madie Reno A. Dub Mikes, M.D.

## 2014-02-18 NOTE — Progress Notes (Addendum)
Writer spoke with patient 1:1 in his room. He was observed lying down resting in his bed after attending group this evening. He voiced no complaints just reports getting his mind right before his discharge. Writer encouraged him to remain positive and wished him well with his discharge plans. He denies si/hi/visual  hallucinations. Reports auditory hallucinations. Safety maintained with 15 min checks.

## 2014-02-18 NOTE — [facility] Group Notes (Addendum)
[facility] Group Notes:  (Nursing/MHT/Case Management/Adjunct)  Date:  02/18/2014  Time:  10:09 AM  Type of Therapy:  Psychoeducational Skills  Participation Level:  Active  Participation Quality:  Appropriate  Affect:  Appropriate  Cognitive:  Appropriate  Insight:  Good  Engagement in Group:  Engaged  Modes of Intervention:  Discussion, Education and Exploration  Summary of Progress/Problems:Review of healthy support systems and healthy lifestyle. A poem in packet (a hole in my sidewalk ) was read and Patients were encouraged to focus on unhealthy supports , negative choices and prior choices to engage in healthier support systems and lifestyle.    Curtis Cobb 02/18/2014, 10:09 AM

## 2014-02-18 NOTE — [facility] Group Notes (Signed)
[facility] Group Notes:  (Nursing/MHT/Case Management/Adjunct)  Date:  02/18/2014  Time:  0900  Type of Therapy:  Psychoeducational Skills--self inventory review  Participation Level:  Active  Participation Quality:  Appropriate  Affect:  Appropriate  Cognitive:  Appropriate  Insight:  Appropriate  Engagement in Group:  Engaged  Modes of Intervention:  Discussion and Education  Summary of Progress/Problems: self inventory review with RN.  Malva Limes 02/18/2014, 10:06 AM

## 2014-02-18 NOTE — Progress Notes (Signed)
[facility] Group Notes:  (Nursing/MHT/Case Management/Adjunct)  Date:  02/18/2014  Time:  8:00 p.m.   Type of Therapy:  Psychoeducational Skills  Participation Level:  Active  Participation Quality:  Appropriate  Affect:  Appropriate  Cognitive:  Appropriate  Insight:  Good  Engagement in Group:  Engaged  Modes of Intervention:  Education  Summary of Progress/Problems: The patient mentioned in group this evening that he had a good day since he felt a "sense of well being" as opposed to the way he felt when first admitted. In addition, he acknowledges that his mood more stable. As a theme for the day, he indicated that his support system will consist of the Act Team, A.A., friends, and family.   Hazle Coca S 02/18/2014, 8:49 PM

## 2014-02-18 NOTE — [facility] Group Notes (Signed)
[facility] Group Notes: (Clinical Social Work)   02/18/2014      Type of Therapy:  Group Therapy   Participation Level:  Did Not Attend - CSW went to his room to remind him this was going to be music therapy and he had requested some music which would be played, and he still refused to come.   Ambrose Mantle, LCSW 02/18/2014, 12:10 PM

## 2014-02-18 NOTE — Progress Notes (Signed)
Patient ID: Curtis Cobb, male   DOB: 09-09-1971, 43 y.o.   MRN: [id number] D. Patient presents with neutral mood, affect congruent. Patient reports his mood is improving however he states '' Today is just a hard day, this is the anniversary of my mothers death so I've just go a lot on my mind. But I'm doing good, looking forward to tomorrow '' Patient reports he is looking forward to discharge on Monday to halfway house . He denies any acute concerns at this time. A. Medications given as ordered. Discussed above information with A. Nwoko NP. Support and encouragement provided.. R. Patient denies any further acute concerns. Will continue to monitor q 15 minutes for safety.

## 2014-02-19 MED ORDER — HALOPERIDOL 5 MG PO TABS: 5.0000 mg | ORAL_TABLET | Freq: Two times a day (BID) | ORAL | Status: DC

## 2014-02-19 MED ORDER — TRAZODONE HCL 100 MG PO TABS
100.0000 mg | ORAL_TABLET | Freq: Every evening | ORAL | Status: DC | PRN
Start: 2014-02-19 — End: 2014-03-16

## 2014-02-19 MED ORDER — LAMOTRIGINE 25 MG PO TABS: 25.0000 mg | ORAL_TABLET | Freq: Every day | ORAL | Status: DC

## 2014-02-19 MED ORDER — BENZTROPINE MESYLATE 1 MG PO TABS: 1.0000 mg | ORAL_TABLET | Freq: Two times a day (BID) | ORAL | Status: DC

## 2014-02-19 MED ORDER — ADULT MULTIVITAMIN W/MINERALS CH: 1.0000 | ORAL_TABLET | Freq: Every day | ORAL | Status: DC

## 2014-02-19 NOTE — Progress Notes (Signed)
[facility] Adult Case Management Discharge Plan :  Will you be returning to the same living situation after discharge: No. At discharge, do you have transportation home?:Yes,  Curtis Humphrey Do you have the ability to pay for your medications:Yes,  mental health  Release of information consent forms completed and in the chart;  Patient's signature needed at discharge.  Patient to Follow up at: Follow-up Information   Follow up with [facility]. (Go to the walk-in clinic M-F between 8 and 9am for your hospital follow-up appointment.  )    Contact information:   [address] [place] [phone]      Patient denies SI/HI:   Yes,  yes    Safety Planning and Suicide Prevention discussed:  Yes,  yes  Curtis Cobb 02/19/2014, 11:54 AM

## 2014-02-19 NOTE — Progress Notes (Signed)
Patient ID: Curtis Cobb, male   DOB: 11/05/1971, 43 y.o.   MRN: [id number] Nursing discharge note:  Patient discharged from [facility] per MD order.  Patient received all personal belongings, prescriptions and medication samples.  Patient denies SI/HI/AVH.  Patient received valuables from safe and guitar on back shelf. Patient left ambulatory with rep from [facility] [employer] [facility] [address]  Patient states he had a very satisfactory stay and looks forward to "a new beginning."

## 2014-02-19 NOTE — [facility] Suicide Risk Assessment (Signed)
   Demographic Factors:  Male, Caucasian, Low socioeconomic status and Unemployed  Total Time spent with patient: 20 minutes  Psychiatric Specialty Exam: Physical Exam  Psychiatric: He has a normal mood and affect. His speech is normal and behavior is normal. Judgment and thought content normal. Cognition and memory are normal.    Review of Systems  Constitutional: Negative.   HENT: Negative.   Eyes: Negative.   Respiratory: Negative.   Cardiovascular: Negative.   Gastrointestinal: Negative.   Genitourinary: Negative.   Musculoskeletal: Negative.   Skin: Negative.   Neurological: Negative.   Endo/Heme/Allergies: Negative.   Psychiatric/Behavioral: Negative.     Blood pressure 108/66, pulse 86, temperature 98.3 F (36.8 C), temperature source Oral, resp. rate 18, height 6\' 3"  (1.905 m), weight 87.091 kg (192 lb), SpO2 97.00%.Body mass index is 24 kg/(m^2).  General Appearance: Fairly Groomed  ::  Fair  Speech:  Clear and Coherent and Normal Rate  Volume:  Normal  Mood:  Euthymic  Affect:  Appropriate  Thought Process:  Goal Directed and Linear  Orientation:  Full (Time, Place, and Person)  Thought Content:  Negative  Suicidal Thoughts:  No  Homicidal Thoughts:  No  Memory:  Immediate;   Fair Recent;   Fair Remote;   Fair  Judgement:  Fair  Insight:  Fair  Psychomotor Activity:  Normal  Concentration:  Fair  Recall:  [id number] of Knowledge:Fair  Language: Fair  Akathisia:  No  Handed:  Right  AIMS (if indicated):     Assets:  Communication Skills Desire for Improvement Physical Health  Sleep:  Number of Hours: 6.75    Musculoskeletal: Strength & Muscle Tone: within normal limits Gait & Station: normal Patient leans: N/A   Mental Status Per Nursing Assessment::   On Admission:  Suicidal ideation indicated by patient  Current Mental Status by Physician: patient denies suicidal ideation, intent or plan  Loss Factors: Financial  problems/change in socioeconomic status  Historical Factors: Impulsivity  Risk Reduction Factors:   Sense of responsibility to family  Continued Clinical Symptoms:  Alcohol/Substance Abuse/Dependencies  Cognitive Features That Contribute To Risk:  Closed-mindedness    Suicide Risk:  Minimal: No identifiable suicidal ideation.  Patients presenting with no risk factors but with morbid ruminations; may be classified as minimal risk based on the severity of the depressive symptoms  Discharge Diagnoses:   AXIS I:  Schizoaffective disorder              Alcohol dependence              Cannabis abuse AXIS II:  Deferred AXIS III:   Past Medical History  Diagnosis Date  . Hypertension    AXIS IV:  economic problems, housing problems, other psychosocial or environmental problems and problems related to social environment AXIS V:  61-70 mild symptoms  Plan Of Care/Follow-up recommendations:  Activity:  as tolerated Diet:  healthy Tests:  routine Other:  patient to keep his after care appointment  Is patient on multiple antipsychotic therapies at discharge:  No   Has Patient had three or more failed trials of antipsychotic monotherapy by history:  No  Recommended Plan for Multiple Antipsychotic Therapies: NA    Conlee Sliter,MD 02/19/2014, 8:59 AM

## 2014-02-19 NOTE — Discharge Summary (Signed)
Physician Discharge Summary Note  Patient:  Curtis Cobb is an 43 y.o., male MRN:  [id number] DOB:  24-Aug-1971 Patient phone:  [phone] (home)  Patient address:   [place] [address],  Total Time spent with patient: 20 minutes  Date of Admission:  02/12/2014 Date of Discharge: 02/19/14  Reason for Admission:  Psychosis   Discharge Diagnoses: Principal Problem:   Schizoaffective disorder Active Problems:   Alcohol dependence   Cannabis abuse   Psychiatric Specialty Exam: Physical Exam  Review of Systems  Constitutional: Negative.   HENT: Negative.   Eyes: Negative.   Respiratory: Negative.   Cardiovascular: Negative.   Gastrointestinal: Negative.   Genitourinary: Negative.   Musculoskeletal: Negative.   Skin: Negative.   Neurological: Negative.   Endo/Heme/Allergies: Negative.   Psychiatric/Behavioral: Negative for depression, suicidal ideas, hallucinations, memory loss and substance abuse. The patient is not nervous/anxious and does not have insomnia.     Blood pressure 108/66, pulse 86, temperature 98.3 F (36.8 C), temperature source Oral, resp. rate 18, height 6\' 3"  (1.905 m), weight 87.091 kg (192 lb), SpO2 97.00%.Body mass index is 24 kg/(m^2).  General Appearance: Fairly Groomed  ::  Fair  Speech:  Clear and Coherent and Normal Rate  Volume:  Normal  Mood:  Euthymic  Affect:  Appropriate  Thought Process:  Goal Directed and Linear  Orientation:  Full (Time, Place, and Person)  Thought Content:  Negative  Suicidal Thoughts:  No  Homicidal Thoughts:  No  Memory:  Immediate;   Fair Recent;   Fair Remote;   Fair  Judgement:  Fair  Insight:  Fair  Psychomotor Activity:  Normal  Concentration:  Fair  Recall:  [id number] of Knowledge:Fair  Language: Fair  Akathisia:  No  Handed:  Right  AIMS (if indicated):     Assets:  Communication Skills Desire for Improvement Physical Health  Sleep:  Number of Hours: 6.75    Past  Psychiatric History: Yes  Diagnosis: Bipolar, Schizophrenia   Hospitalizations:Hospital in [place], [place]  Outpatient Care:[facility] in the past  Substance Abuse Care: Denies  Self-Mutilation: Denies  Suicidal Attempts: Cut left wrist ten years ago   Violent Behaviors: Denies   Musculoskeletal: Strength & Muscle Tone: within normal limits Gait & Station: normal Patient leans: N/A  DSM5: AXIS I: Schizoaffective disorder  Alcohol dependence  Cannabis abuse  AXIS II: Deferred  AXIS III:  Past Medical History   Diagnosis  Date   .  Hypertension    AXIS IV: economic problems, housing problems, other psychosocial or environmental problems and problems related to social environment  AXIS V: 61-70 mild symptoms   Level of Care:  OP  Hospital Course:  Curtis Cobb is a 43 year old male who presented voluntarily to [facility] to obtain help with psychotic symptoms. The patient has been off his psychiatric medications for several weeks and has been drinking a fifth of liquor daily. Patient is unable to remember what medications he takes and denies having any recent follow up for mental health problems. Curtis Cobb depression over his mother's death with a worsening of symptoms due to the anniversary coming up on February 22nd. Patient states "I have been hearing and seeing things. It's making it hard for me to determine what's real. Sometime I also have manic episodes where I don't sleep and am on top of the world. I am very depressed right now. I am about to be homeless. 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 At times I hear voices saying my name, groups of people talking, sometimes music and animals." Curtis Cobb appears very depressed throughout the assessment and had trouble at times processing the questions. At one point patient stated "I just got up and my mind is still very foggy."   Patient was admitted to the 400 hallway for  further assessment and medication management. The patient was not taking any psychiatric medications prior to admission. He was started on Haldol 5 mg BID for psychosis and Cogentin 1 mg BID for EPS prevention. The medication Lamictal was later added to improve his mood stability and to decrease his anxiety levels. Patient reported continued depression about his mother's death but was able to express hope about his future. Curtis Cobb was excited to go to a halfway house after discharge from the hospital. He reported a slow decrease in his auditory hallucinations from his treatment with Haldol. The patient completed the librium detox protocol for alcohol use with a slow but steady decline in withdrawal symptoms. Curtis Cobb attended the scheduled groups with no reported behavioral problems during his admission. Patient was found to be stable for discharge and will follow up with [facility]. Patient received prescriptions and sample medications prior to his discharge. He left [facility] with all belongings in stable condition.   Consults:  psychiatry  Significant Diagnostic Studies:  Admission labs reviewed and completed.   Discharge Vitals:   Blood pressure 108/66, pulse 86, temperature 98.3 F (36.8 C), temperature source Oral, resp. rate 18, height 6\' 3"  (1.905 m), weight 87.091 kg (192 lb), SpO2 97.00%. Body mass index is 24 kg/(m^2). Lab Results:   No results found for this or any previous visit (from the past 72 hour(s)).  Physical Findings: AIMS: Facial and Oral Movements Muscles of Facial Expression: None, normal Lips and Perioral Area: None, normal Jaw: None, normal Tongue: None, normal,Extremity Movements Upper (arms, wrists, hands, fingers): None, normal Lower (legs, knees, ankles, toes): None, normal, Trunk Movements Neck, shoulders, hips: None, normal, Overall Severity Severity of abnormal movements (highest score from questions above): None, normal Incapacitation due to abnormal movements: None,  normal Patient's awareness of abnormal movements (rate only patient's report): No Awareness, Dental Status Current problems with teeth and/or dentures?: No Does patient usually wear dentures?: No  CIWA:  CIWA-Ar Total: 0 COWS:  COWS Total Score: 5  Psychiatric Specialty Exam: See Psychiatric Specialty Exam and Suicide Risk Assessment completed by Attending Physician prior to discharge.  Discharge destination:  Home  Is patient on multiple antipsychotic therapies at discharge:  No   Has Patient had three or more failed trials of antipsychotic monotherapy by history:  No  Recommended Plan for Multiple Antipsychotic Therapies: NA     Medication List       Indication   benztropine 1 MG tablet  Commonly known as:  COGENTIN  Take 1 tablet (1 mg total) by mouth 2 (two) times daily.   Indication:  Extrapyramidal Reaction caused by Medications     haloperidol 5 MG tablet  Commonly known as:  HALDOL  Take 1 tablet (5 mg total) by mouth 2 (two) times daily.   Indication:  psychosis     lamoTRIgine 25 MG tablet  Commonly known as:  LAMICTAL  Take 1 tablet (25 mg total) by mouth daily.   Indication:  Manic-Depression     multivitamin with minerals Tabs tablet  Take 1 tablet by mouth daily. Please purchase over  the counter from any pharmacy to continue taking.   Indication:  Vitamin Supplementation     traZODone 100 MG tablet  Commonly known as:  DESYREL  Take 1 tablet (100 mg total) by mouth at bedtime as needed for sleep.   Indication:  Trouble Sleeping           Follow-up Information   Follow up with [facility]. (Go to the walk-in clinic M-F between 8 and 9am for your hospital follow-up appointment.  )    Contact information:   [address] [place] [phone]      Follow-up recommendations:   Activity: as tolerated  Diet: healthy  Tests: routine  Other: patient to keep his after care appointment   Comments:   Take all your medications as prescribed by your  mental healthcare provider.  Report any adverse effects and or reactions from your medicines to your outpatient provider promptly.  Patient is instructed and cautioned to not engage in alcohol and or illegal drug use while on prescription medicines.  In the event of worsening symptoms, patient is instructed to call the crisis hotline, 911 and or go to the nearest ED for appropriate evaluation and treatment of symptoms.  Follow-up with your primary care provider for your other medical issues, concerns and or health care needs.   Total Discharge Time:  Greater than 30 minutes.  SignedFransisca Kaufmann NP-C 02/19/2014, 9:08 AM  Patient seen, evaluated and I agree with notes by Nurse Practitioner. Thedore Mins, MD

## 2014-02-19 NOTE — Tx Team (Signed)
  Interdisciplinary Treatment Plan Update   Date Reviewed:  02/19/2014  Time Reviewed:  8:13 AM  Progress in Treatment:   Attending groups: Yes Participating in groups: Yes Taking medication as prescribed: Yes  Tolerating medication: Yes Family/Significant other contact made: Yes  Patient understands diagnosis: Yes  Discussing patient identified problems/goals with staff: Yes Medical problems stabilized or resolved: Yes Denies suicidal/homicidal ideation: Yes Patient has not harmed self or others: Yes  For review of initial/current patient goals, please see plan of care.  Estimated Length of Stay:  D/C today  Reason for Continuation of Hospitalization:   New Problems/Goals identified:  N/A  Discharge Plan or Barriers:   D/C to half way house, follow up outpt  Additional Comments:  Attendees:  Signature: Thedore Mins, MD 02/19/2014 8:13 AM   Signature: Richelle Ito, LCSW 02/19/2014 8:13 AM  Signature: Fransisca Kaufmann, NP 02/19/2014 8:13 AM  Signature: Joslyn Devon, RN 02/19/2014 8:13 AM  Signature: Liborio Nixon, RN 02/19/2014 8:13 AM  Signature:  02/19/2014 8:13 AM  Signature:   02/19/2014 8:13 AM  Signature:    Signature:    Signature:    Signature:    Signature:    Signature:      Scribe for Treatment Team:   Richelle Ito, LCSW  02/19/2014 8:13 AM

## 2014-02-21 NOTE — Progress Notes (Signed)
Patient Discharge Instructions:  After Visit Summary (AVS):   Faxed to:  02/21/14 Discharge Summary Note:   Faxed to:  02/21/14 Psychiatric Admission Assessment Note:   Faxed to:  02/21/14 Suicide Risk Assessment - Discharge Assessment:   Faxed to:  02/21/14 Faxed/Sent to the Next Level Care provider:  02/21/14 Faxed to [facility] @ [phone]  Jerelene Redden, 02/21/2014, 3:58 PM

## 2014-03-15 ENCOUNTER — Encounter (HOSPITAL_COMMUNITY): Payer: Self-pay | Admitting: Emergency Medicine

## 2014-03-15 ENCOUNTER — Emergency Department (HOSPITAL_COMMUNITY)
Admission: EM | Admit: 2014-03-15 | Discharge: 2014-03-17 | Disposition: A | Discharge status: Other Acute Inpatient Hospital | Payer: Federal, State, Local not specified - Other | Attending: Emergency Medicine | Admitting: Emergency Medicine

## 2014-03-15 DIAGNOSIS — F329 Major depressive disorder, single episode, unspecified: Secondary | ICD-10-CM

## 2014-03-15 DIAGNOSIS — F209 Schizophrenia, unspecified: Secondary | ICD-10-CM | POA: Insufficient documentation

## 2014-03-15 DIAGNOSIS — F17200 Nicotine dependence, unspecified, uncomplicated: Secondary | ICD-10-CM | POA: Insufficient documentation

## 2014-03-15 DIAGNOSIS — F3130 Bipolar disorder, current episode depressed, mild or moderate severity, unspecified: Secondary | ICD-10-CM | POA: Insufficient documentation

## 2014-03-15 DIAGNOSIS — I10 Essential (primary) hypertension: Secondary | ICD-10-CM | POA: Insufficient documentation

## 2014-03-15 DIAGNOSIS — R45851 Suicidal ideations: Secondary | ICD-10-CM | POA: Insufficient documentation

## 2014-03-15 DIAGNOSIS — Z9119 Patient's noncompliance with other medical treatment and regimen: Secondary | ICD-10-CM | POA: Insufficient documentation

## 2014-03-15 DIAGNOSIS — F1010 Alcohol abuse, uncomplicated: Secondary | ICD-10-CM | POA: Insufficient documentation

## 2014-03-15 DIAGNOSIS — R000 Tachycardia, unspecified: Secondary | ICD-10-CM | POA: Insufficient documentation

## 2014-03-15 DIAGNOSIS — F1020 Alcohol dependence, uncomplicated: Secondary | ICD-10-CM

## 2014-03-15 DIAGNOSIS — F10929 Alcohol use, unspecified with intoxication, unspecified: Secondary | ICD-10-CM

## 2014-03-15 DIAGNOSIS — F32A Depression, unspecified: Secondary | ICD-10-CM

## 2014-03-15 DIAGNOSIS — Z79899 Other long term (current) drug therapy: Secondary | ICD-10-CM | POA: Insufficient documentation

## 2014-03-15 DIAGNOSIS — Z91199 Patient's noncompliance with other medical treatment and regimen due to unspecified reason: Secondary | ICD-10-CM | POA: Insufficient documentation

## 2014-03-15 DIAGNOSIS — F411 Generalized anxiety disorder: Secondary | ICD-10-CM | POA: Insufficient documentation

## 2014-03-15 LAB — ETHANOL: Alcohol, Ethyl (B): 438 mg/dL (ref 0–11)

## 2014-03-15 LAB — URINALYSIS, ROUTINE W REFLEX MICROSCOPIC
Bilirubin Urine: NEGATIVE
Glucose, UA: NEGATIVE mg/dL
Hgb urine dipstick: NEGATIVE
Ketones, ur: NEGATIVE mg/dL
Leukocytes, UA: NEGATIVE
Nitrite: NEGATIVE
Protein, ur: NEGATIVE mg/dL
Specific Gravity, Urine: 1.006 (ref 1.005–1.030)
Urobilinogen, UA: 0.2 mg/dL (ref 0.0–1.0)
pH: 6 (ref 5.0–8.0)

## 2014-03-15 LAB — CBC WITH DIFFERENTIAL/PLATELET
BASOS ABS: 0 10*3/uL (ref 0.0–0.1)
Basophils Relative: 0 % (ref 0–1)
Eosinophils Absolute: 0.1 10*3/uL (ref 0.0–0.7)
Eosinophils Relative: 1 % (ref 0–5)
HEMATOCRIT: 47.4 % (ref 39.0–52.0)
Hemoglobin: 16.6 g/dL (ref 13.0–17.0)
LYMPHS PCT: 33 % (ref 12–46)
Lymphs Abs: 4.5 10*3/uL — ABNORMAL HIGH (ref 0.7–4.0)
MCH: 31.1 pg (ref 26.0–34.0)
MCHC: 35 g/dL (ref 30.0–36.0)
MCV: 88.8 fL (ref 78.0–100.0)
MONOS PCT: 10 % (ref 3–12)
Monocytes Absolute: 1.4 10*3/uL — ABNORMAL HIGH (ref 0.1–1.0)
NEUTROS ABS: 7.6 10*3/uL (ref 1.7–7.7)
Neutrophils Relative %: 56 % (ref 43–77)
PLATELETS: 371 10*3/uL (ref 150–400)
RBC: 5.34 MIL/uL (ref 4.22–5.81)
RDW: 13.5 % (ref 11.5–15.5)
WBC: 13.6 10*3/uL — AB (ref 4.0–10.5)

## 2014-03-15 LAB — COMPREHENSIVE METABOLIC PANEL
ALT: 22 U/L (ref 0–53)
AST: 31 U/L (ref 0–37)
Albumin: 3.9 g/dL (ref 3.5–5.2)
Alkaline Phosphatase: 68 U/L (ref 39–117)
BUN: 12 mg/dL (ref 6–23)
CO2: 18 mEq/L — ABNORMAL LOW (ref 19–32)
Calcium: 9.4 mg/dL (ref 8.4–10.5)
Chloride: 93 mEq/L — ABNORMAL LOW (ref 96–112)
Creatinine, Ser: 0.87 mg/dL (ref 0.50–1.35)
GFR calc Af Amer: >90 mL/min (ref >90)
GFR calc non Af Amer: >90 mL/min (ref >90)
Glucose, Bld: 142 mg/dL — ABNORMAL HIGH (ref 70–99)
Potassium: 3.1 mEq/L — ABNORMAL LOW (ref 3.7–5.3)
Sodium: 137 mEq/L (ref 137–147)
Total Bilirubin: 0.4 mg/dL (ref 0.3–1.2)
Total Protein: 8.1 g/dL (ref 6.0–8.3)

## 2014-03-15 LAB — RAPID URINE DRUG SCREEN, HOSP PERFORMED
Amphetamines: NOT DETECTED
Barbiturates: NOT DETECTED
Benzodiazepines: NOT DETECTED
Cocaine: NOT DETECTED
Opiates: NOT DETECTED
Tetrahydrocannabinol: NOT DETECTED

## 2014-03-15 LAB — ACETAMINOPHEN LEVEL: Acetaminophen (Tylenol), Serum: <15 ug/mL (ref 10–30)

## 2014-03-15 LAB — SALICYLATE LEVEL: Salicylate Lvl: <2 mg/dL — ABNORMAL LOW (ref 2.8–20.0)

## 2014-03-15 MED ORDER — LAMOTRIGINE 25 MG PO TABS
25.0000 mg | ORAL_TABLET | Freq: Every day | ORAL | Status: DC
  Administered 2014-03-16 – 2014-03-17 (×2): 25 mg via ORAL
  Filled 2014-03-15 (×2): qty 1

## 2014-03-15 MED ORDER — HALOPERIDOL 5 MG PO TABS
5.0000 mg | ORAL_TABLET | Freq: Two times a day (BID) | ORAL | Status: DC
  Administered 2014-03-16 – 2014-03-17 (×3): 5 mg via ORAL
  Filled 2014-03-15 (×3): qty 1

## 2014-03-15 MED ORDER — IBUPROFEN 200 MG PO TABS
600.0000 mg | ORAL_TABLET | Freq: Three times a day (TID) | ORAL | Status: DC | PRN
Start: 2014-03-15 — End: 2014-03-17

## 2014-03-15 MED ORDER — LORAZEPAM 1 MG PO TABS
1.0000 mg | ORAL_TABLET | Freq: Three times a day (TID) | ORAL | Status: DC | PRN
  Administered 2014-03-15 – 2014-03-16 (×2): 1 mg via ORAL
  Filled 2014-03-15 (×2): qty 1

## 2014-03-15 MED ORDER — ACETAMINOPHEN 325 MG PO TABS: 650.0000 mg | ORAL_TABLET | ORAL | Status: DC | PRN

## 2014-03-15 MED ORDER — BENZTROPINE MESYLATE 1 MG PO TABS
1.0000 mg | ORAL_TABLET | Freq: Two times a day (BID) | ORAL | Status: DC
  Administered 2014-03-16 – 2014-03-17 (×3): 1 mg via ORAL
  Filled 2014-03-15 (×3): qty 1

## 2014-03-15 MED ORDER — NICOTINE 21 MG/24HR TD PT24
21.0000 mg | MEDICATED_PATCH | Freq: Every day | TRANSDERMAL | Status: DC
  Filled 2014-03-15: qty 1

## 2014-03-15 MED ORDER — SODIUM CHLORIDE 0.9 % IV BOLUS (SEPSIS)
1000.0000 mL | Freq: Once | INTRAVENOUS | Status: AC
  Administered 2014-03-15: 1000 mL via INTRAVENOUS

## 2014-03-15 MED ORDER — ONDANSETRON HCL 4 MG PO TABS: 4.0000 mg | ORAL_TABLET | Freq: Three times a day (TID) | ORAL | Status: DC | PRN

## 2014-03-15 MED ORDER — ALUM & MAG HYDROXIDE-SIMETH 200-200-20 MG/5ML PO SUSP: 30.0000 mL | ORAL | Status: DC | PRN

## 2014-03-15 NOTE — ED Provider Notes (Signed)
CSN: [id number]     Arrival date & time 03/15/14  1659 History   First MD Initiated Contact with Patient 03/15/14 1708     Chief Complaint  Patient presents with  . Altered Mental Status     (Consider location/radiation/quality/duration/timing/severity/associated sxs/prior Treatment) HPI Comments: 43 yo male presenting with altered mental status.  He has been staying at a halfway house.  The owner states that he's been depressed for the past several days.  He talked to the owner on the phone and stated that he needed to go to detox. He did not state what he had taken. He is suspected to have had alcohol.  No other history is available.  Level V caveat due to altered male status.  Patient is a 43 y.o. male presenting with altered mental status.  Altered Mental Status Presenting symptoms: partial responsiveness   Severity:  Severe Most recent episode:  Today Episode history:  Single Duration: unclear, worsened over last hour. Timing:  Constant Progression:  Worsening Chronicity:  New Context comment:  Hx of schizophrenia.  Has been staying at a halfway house, whose owner reports patient has seemed very depressed for past several days.   Associated symptoms: depression   Associated symptoms: no fever     Past Medical History  Diagnosis Date  . Bipolar 1 disorder   . Schizophrenia   . Hypertension   . Depression   . Anxiety    No past surgical history on file. No family history on file. History  Substance Use Topics  . Smoking status: Current Every Day Smoker -- 1.00 packs/day for 20 years    Types: Cigarettes  . Smokeless tobacco: Not on file  . Alcohol Use: Yes     Comment: daily 1/5 liquor    Review of Systems  Unable to perform ROS: Mental status change  Constitutional: Negative for fever.      Allergies  Sulfa antibiotics  Home Medications   Current Outpatient Rx  Name  Route  Sig  Dispense  Refill  . benztropine (COGENTIN) 1 MG tablet   Oral   Take 1  tablet (1 mg total) by mouth 2 (two) times daily.   60 tablet   0   . haloperidol (HALDOL) 5 MG tablet   Oral   Take 1 tablet (5 mg total) by mouth 2 (two) times daily.   60 tablet   0   . lamoTRIgine (LAMICTAL) 25 MG tablet   Oral   Take 1 tablet (25 mg total) by mouth daily.   30 tablet   0   . Multiple Vitamin (MULTIVITAMIN WITH MINERALS) TABS tablet   Oral   Take 1 tablet by mouth daily. Please purchase over the counter from any pharmacy to continue taking.         . traZODone (DESYREL) 100 MG tablet   Oral   Take 1 tablet (100 mg total) by mouth at bedtime as needed for sleep.   30 tablet   0    There were no vitals taken for this visit. Physical Exam  Nursing note and vitals reviewed. Constitutional: He is oriented to person, place, and time. He appears well-developed and well-nourished. No distress.  HENT:  Head: Normocephalic and atraumatic.  Mouth/Throat: Oropharynx is clear and moist.  Eyes: Conjunctivae are normal. Pupils are equal, round, and reactive to light. No scleral icterus.  Neck: Neck supple.  Cardiovascular: Regular rhythm, normal heart sounds and intact distal pulses.  Tachycardia present.   No murmur  heard. Pulmonary/Chest: Effort normal and breath sounds normal. No stridor. No respiratory distress. He has no wheezes. He has no rales.  Abdominal: Soft. He exhibits no distension. There is no tenderness.  Musculoskeletal: Normal range of motion. He exhibits no edema.  Neurological: He is alert and oriented to person, place, and time. GCS eye subscore is 4. GCS verbal subscore is 1. GCS motor subscore is 5.  Does not follow commands. Normal muscle tone with no rigidity noted.  Skin: Skin is warm and dry. No rash noted.  Some flushing of the chest and face.  Psychiatric: He has a normal mood and affect. His behavior is normal.    ED Course  Procedures (including critical care time) Labs Review Labs Reviewed  CBC WITH DIFFERENTIAL - Abnormal;  Notable for the following:    WBC 13.6 (*)    Lymphs Abs 4.5 (*)    Monocytes Absolute 1.4 (*)    All other components within normal limits  COMPREHENSIVE METABOLIC PANEL - Abnormal; Notable for the following:    Potassium 3.1 (*)    Chloride 93 (*)    CO2 18 (*)    Glucose, Bld 142 (*)    All other components within normal limits  ETHANOL - Abnormal; Notable for the following:    Alcohol, Ethyl (B) 438 (*)    All other components within normal limits  SALICYLATE LEVEL - Abnormal; Notable for the following:    Salicylate Lvl <2.0 (*)    All other components within normal limits  ACETAMINOPHEN LEVEL  URINE RAPID DRUG SCREEN (HOSP PERFORMED)  URINALYSIS, ROUTINE W REFLEX MICROSCOPIC  CBG MONITORING, ED   Imaging Review No results found.   EKG Interpretation   Date/Time:  Thursday March 15 2014 17:05:15 EDT Ventricular Rate:  134 PR Interval:  130 QRS Duration: 98 QT Interval:  308 QTC Calculation: 460 R Axis:   36 Text Interpretation:  Sinus tachycardia Multiform ventricular premature  complexes Probable left atrial enlargement Anteroseptal infarct, old  Baseline wander No significant change was found Confirmed by [facility]  MD,  TREY ([id number]) on 03/15/2014 6:48:35 PM      MDM   Final diagnoses:  Acute alcohol intoxication  Depression  Suicidal ideation      43 year old male presenting with altered mental status after a suspected overdose.  He did not respond to Narcan. Respiratory drive is adequate and he is currently protecting his airway. EKG shows tachycardia without widening of his QRS or significant elevations in his QTC.   10:47 PM on re-eval, pt much more alert.  He reports drinking only alcohol (a fifth of vodka) and denies other co-ingestions.  He does report SI with plan to cut wrists.  He reports being noncompliant with his Haldol.  He does endorse continued auditory hallucinations.  Will consult TTS and place in psych hold.    Merrie Roof,  MD 03/16/14 [phone]

## 2014-03-15 NOTE — ED Notes (Signed)
Pt arrives unresponsive brought by friends. 0.4 narcan given. MD at bedside. Pt opens eyes to sternal rub. Pt's face flushed below eyes. Ammonia capsule given and pt woke up, pt confused.

## 2014-03-15 NOTE — ED Notes (Addendum)
Pt now in blue scrubs with sitter at bedside. Pt belongings in 2 bags.   Bag 1: Blue tennis shoes, white tee, blue tee, white socks.  Bag 2: 3 pairs blue jeans, white tee, blue tee, gray tee, black belt, cargo pains, grey sweater, white belonging bag with 1 necklace.

## 2014-03-16 DIAGNOSIS — R45851 Suicidal ideations: Secondary | ICD-10-CM

## 2014-03-16 DIAGNOSIS — F1010 Alcohol abuse, uncomplicated: Secondary | ICD-10-CM

## 2014-03-16 DIAGNOSIS — F259 Schizoaffective disorder, unspecified: Secondary | ICD-10-CM

## 2014-03-16 LAB — CBG MONITORING, ED
Comment 1: 632449827
Glucose-Capillary: 121 mg/dL — ABNORMAL HIGH (ref 70–99)

## 2014-03-16 MED ORDER — THIAMINE HCL 100 MG/ML IJ SOLN
100.0000 mg | Freq: Once | INTRAMUSCULAR | Status: AC
  Administered 2014-03-16: 100 mg via INTRAMUSCULAR
  Filled 2014-03-16: qty 2

## 2014-03-16 MED ORDER — TRAZODONE HCL 50 MG PO TABS: 50.0000 mg | ORAL_TABLET | Freq: Every evening | ORAL | Status: DC | PRN

## 2014-03-16 MED ORDER — ONDANSETRON 4 MG PO TBDP: 4.0000 mg | ORAL_TABLET | Freq: Four times a day (QID) | ORAL | Status: DC | PRN

## 2014-03-16 MED ORDER — CHLORDIAZEPOXIDE HCL 25 MG PO CAPS: 25.0000 mg | ORAL_CAPSULE | ORAL | Status: DC

## 2014-03-16 MED ORDER — CHLORDIAZEPOXIDE HCL 25 MG PO CAPS
25.0000 mg | ORAL_CAPSULE | Freq: Four times a day (QID) | ORAL | Status: DC
  Administered 2014-03-16 (×4): 25 mg via ORAL
  Filled 2014-03-16 (×4): qty 1

## 2014-03-16 MED ORDER — POTASSIUM CHLORIDE CRYS ER 20 MEQ PO TBCR
40.0000 meq | EXTENDED_RELEASE_TABLET | Freq: Once | ORAL | Status: AC
  Administered 2014-03-16: 40 meq via ORAL
  Filled 2014-03-16: qty 2

## 2014-03-16 MED ORDER — ADULT MULTIVITAMIN W/MINERALS CH
1.0000 | ORAL_TABLET | Freq: Every day | ORAL | Status: DC
  Administered 2014-03-16 – 2014-03-17 (×2): 1 via ORAL
  Filled 2014-03-16 (×2): qty 1

## 2014-03-16 MED ORDER — LOPERAMIDE HCL 2 MG PO CAPS: 2.0000 mg | ORAL_CAPSULE | ORAL | Status: DC | PRN

## 2014-03-16 MED ORDER — CHLORDIAZEPOXIDE HCL 25 MG PO CAPS: 25.0000 mg | ORAL_CAPSULE | Freq: Every day | ORAL | Status: DC

## 2014-03-16 MED ORDER — CHLORDIAZEPOXIDE HCL 25 MG PO CAPS
25.0000 mg | ORAL_CAPSULE | Freq: Three times a day (TID) | ORAL | Status: DC
  Administered 2014-03-17: 25 mg via ORAL
  Filled 2014-03-16: qty 1

## 2014-03-16 MED ORDER — HYDROXYZINE HCL 25 MG PO TABS
25.0000 mg | ORAL_TABLET | Freq: Four times a day (QID) | ORAL | Status: DC | PRN
  Administered 2014-03-16: 25 mg via ORAL
  Filled 2014-03-16: qty 1

## 2014-03-16 MED ORDER — VITAMIN B-1 100 MG PO TABS
100.0000 mg | ORAL_TABLET | Freq: Every day | ORAL | Status: DC
  Administered 2014-03-17: 100 mg via ORAL
  Filled 2014-03-16: qty 1

## 2014-03-16 MED ORDER — CHLORDIAZEPOXIDE HCL 25 MG PO CAPS: 25.0000 mg | ORAL_CAPSULE | Freq: Four times a day (QID) | ORAL | Status: DC | PRN

## 2014-03-16 MED ORDER — CHLORDIAZEPOXIDE HCL 25 MG PO CAPS
50.0000 mg | ORAL_CAPSULE | Freq: Once | ORAL | Status: AC
  Administered 2014-03-16: 50 mg via ORAL
  Filled 2014-03-16: qty 2
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
## 2014-03-16 NOTE — ED Notes (Signed)
Report given to [facility] RN at psych ED

## 2014-03-16 NOTE — ED Provider Notes (Signed)
Pt has been seen by Berna Spare with [facility] - agrees pt needs to be admitted. Will attempt admission at [facility] if beds availabe.  Vida Roller, MD 03/16/14 [phone]

## 2014-03-16 NOTE — ED Notes (Signed)
Pt. Is a 43 yo male with history of unresponsiveness due to severe alcohol intoxication.  Pt. States he has been drinking heavily for "years".  States that he was 10 days into a relapse when this incident occurred.  Pt. Denies any drug use.  States that he has been having SI, states that "I felt defeated, like I had no way out".    Pt. Reportedly broke up with his girlfriend 1 week.  Pt. Reports a history of Bipolar I which he has not been treating.

## 2014-03-16 NOTE — ED Notes (Signed)
Marcus from TTS in room with pt

## 2014-03-16 NOTE — Progress Notes (Signed)
[facility] provided pt with a list of primary care resources, highlighting [employer].

## 2014-03-16 NOTE — Progress Notes (Signed)
[facility] MD Progress Note  03/16/2014 1:27 PM Curtis Cobb  MRN:  [id number] Subjective:   Assessment Note  Curtis Cobb is an 43 y.o. male.  He lives alone and is unemployed. -  Patient is nervous and restless. His feet are restless and he speaks in a pressured manner. Patient is homeless. He says that he has never gotten over his mother's death which was around this time in 2012. He and his fiance broke up one week ago and he fell apart  Pt was discharged from [facility] in the middle of February and did go over to [place]. He had been prescribed Lamictal and Haldol which he said "they were not cooperating with my body." Patient said that the best medicine for him in the past was amyltriptyline. That was prescribed by Dr. Bernita Raisin years ago.  Patient currently endorses SI with plan to cut his wrists. He has had two past attempts and cannot currently contract for safety. Pt denies HI but says that he feels like he could hurt someone if they crossed him. Patient has been seeing shadows of people and hears voices that put him down. Patient also has been getting less than 4H/D of sleep and the visual things may be sleep deprivation.  Patient did start going back to drinking about three days ago. Patient has been drinking 1/5 vodka daily for the last few days. Patient has some detox symptoms, primarily shakiness. Librium protocol has been ordered. He denies other drug use and drug screen was negative      Schizophrenia Disorders:  Schizophrenia (295.7)Schizoaffective   Substance/Addictive Disorders:  Alcohol intoxication with use diosrder, severe Total Time spent with patient: 20 minutes  Axis I: Alcohol Abuse and Schizoaffective Disorder  ADL's:  Impaired  Sleep: Poor  Appetite:  Poor  Suicidal Ideation:  Plan:  suicidal, plans to drink self to death and cut wrists Homicidal Ideation:  Plan:  not homicidal AEB (as evidenced by):  Psychiatric Specialty Exam: Physical Exam  ROS  Blood  pressure 122/74, pulse 104, temperature 97.4 F (36.3 C), temperature source Oral, resp. rate 17, SpO2 97.00%.There is no weight on file to calculate BMI.  General Appearance: Disheveled  Eye Sport and exercise psychologist::  Fair  Speech:  Clear and Coherent  Volume:  Decreased  Mood:  Depressed, Dysphoric and Worthless  Affect:  Constricted and Flat  Thought Process:  Goal Directed  Orientation:  Full (Time, Place, and Person)  Thought Content:  Rumination  Suicidal Thoughts:  Yes.  with intent/plan  Homicidal Thoughts:  No  Memory:  Negative  Judgement:  Fair  Insight:  Fair  Psychomotor Activity:  Increased, Restlessness and Tremor  Concentration:  Fair  Recall:  [place] of Knowledge:Good  Language: Good  Akathisia:  No  Handed:  Right  AIMS (if indicated):     Assets:  Communication Skills Desire for Improvement  Sleep:      Musculoskeletal: Strength & Muscle Tone: within normal limits Gait & Station: normal Patient leans: N/A  Current Medications: Current Facility-Administered Medications  Medication Dose Route Frequency Provider Last Rate Last Dose  . acetaminophen (TYLENOL) tablet 650 mg  650 mg Oral Q4H PRN Houston Siren III, MD      . alum & mag hydroxide-simeth (MAALOX/MYLANTA) 200-200-20 MG/5ML suspension 30 mL  30 mL Oral PRN Houston Siren III, MD      . benztropine (COGENTIN) tablet 1 mg  1 mg Oral BID Houston Siren III, MD   1 mg at  03/16/14 0954  . chlordiazePOXIDE (LIBRIUM) capsule 25 mg  25 mg Oral Q6H PRN Lurena Nida, NP      . chlordiazePOXIDE (LIBRIUM) capsule 25 mg  25 mg Oral QID Lurena Nida, NP   25 mg at 03/16/14 0954   Followed by  . [START ON 03/17/2014] chlordiazePOXIDE (LIBRIUM) capsule 25 mg  25 mg Oral TID Lurena Nida, NP       Followed by  . [START ON 03/18/2014] chlordiazePOXIDE (LIBRIUM) capsule 25 mg  25 mg Oral BH-qamhs Lurena Nida, NP       Followed by  . [START ON 03/19/2014] chlordiazePOXIDE (LIBRIUM) capsule 25 mg  25 mg Oral Daily  Lurena Nida, NP      . haloperidol (HALDOL) tablet 5 mg  5 mg Oral BID Houston Siren III, MD   5 mg at 03/16/14 0954  . hydrOXYzine (ATARAX/VISTARIL) tablet 25 mg  25 mg Oral Q6H PRN Lurena Nida, NP      . ibuprofen (ADVIL,MOTRIN) tablet 600 mg  600 mg Oral Q8H PRN Houston Siren III, MD      . lamoTRIgine (LAMICTAL) tablet 25 mg  25 mg Oral Daily Houston Siren III, MD   25 mg at 03/16/14 0954  . loperamide (IMODIUM) capsule 2-4 mg  2-4 mg Oral PRN Lurena Nida, NP      . LORazepam (ATIVAN) tablet 1 mg  1 mg Oral Q8H PRN Houston Siren III, MD   1 mg at 03/16/14 1057  . multivitamin with minerals tablet 1 tablet  1 tablet Oral Daily Lurena Nida, NP   1 tablet at 03/16/14 [phone]  . nicotine (NICODERM CQ - dosed in mg/24 hours) patch 21 mg  21 mg Transdermal Daily Houston Siren III, MD      . ondansetron (ZOFRAN-ODT) disintegrating tablet 4 mg  4 mg Oral Q6H PRN Lurena Nida, NP      . Derrill Memo ON 03/17/2014] thiamine (VITAMIN B-1) tablet 100 mg  100 mg Oral Daily Lurena Nida, NP       Current Outpatient Prescriptions  Medication Sig Dispense Refill  . benztropine (COGENTIN) 1 MG tablet Take 1 tablet (1 mg total) by mouth 2 (two) times daily.  60 tablet  0  . haloperidol (HALDOL) 5 MG tablet Take 1 tablet (5 mg total) by mouth 2 (two) times daily.  60 tablet  0  . lamoTRIgine (LAMICTAL) 25 MG tablet Take 1 tablet (25 mg total) by mouth daily.  30 tablet  0    Lab Results:  Results for orders placed during the hospital encounter of 03/15/14 (from the past 48 hour(s))  CBG MONITORING, ED     Status: Abnormal   Collection Time    03/15/14  5:09 PM      Result Value Ref Range   Glucose-Capillary 121 (*) 70 - 99 mg/dL   Comment 1 [id number] Orig Pt ID    ACETAMINOPHEN LEVEL     Status: None   Collection Time    03/15/14  5:10 PM      Result Value Ref Range   Acetaminophen (Tylenol), Serum <15.0  10 - 30 ug/mL   Comment:            THERAPEUTIC CONCENTRATIONS VARY      SIGNIFICANTLY. A RANGE OF 10-30     ug/mL MAY BE AN EFFECTIVE     CONCENTRATION FOR MANY PATIENTS.     HOWEVER, SOME  ARE BEST TREATED     AT CONCENTRATIONS OUTSIDE THIS     RANGE.     ACETAMINOPHEN CONCENTRATIONS     >150 ug/mL AT 4 HOURS AFTER     INGESTION AND >50 ug/mL AT 12     HOURS AFTER INGESTION ARE     OFTEN ASSOCIATED WITH TOXIC     REACTIONS.  CBC WITH DIFFERENTIAL     Status: Abnormal   Collection Time    03/15/14  5:10 PM      Result Value Ref Range   WBC 13.6 (*) 4.0 - 10.5 K/uL   RBC 5.34  4.22 - 5.81 MIL/uL   Hemoglobin 16.6  13.0 - 17.0 g/dL   HCT 47.4  39.0 - 52.0 %   MCV 88.8  78.0 - 100.0 fL   MCH 31.1  26.0 - 34.0 pg   MCHC 35.0  30.0 - 36.0 g/dL   RDW 13.5  11.5 - 15.5 %   Platelets 371  150 - 400 K/uL   Neutrophils Relative % 56  43 - 77 %   Lymphocytes Relative 33  12 - 46 %   Monocytes Relative 10  3 - 12 %   Eosinophils Relative 1  0 - 5 %   Basophils Relative 0  0 - 1 %   Neutro Abs 7.6  1.7 - 7.7 K/uL   Lymphs Abs 4.5 (*) 0.7 - 4.0 K/uL   Monocytes Absolute 1.4 (*) 0.1 - 1.0 K/uL   Eosinophils Absolute 0.1  0.0 - 0.7 K/uL   Basophils Absolute 0.0  0.0 - 0.1 K/uL   Smear Review MORPHOLOGY UNREMARKABLE    COMPREHENSIVE METABOLIC PANEL     Status: Abnormal   Collection Time    03/15/14  5:10 PM      Result Value Ref Range   Sodium 137  137 - 147 mEq/L   Potassium 3.1 (*) 3.7 - 5.3 mEq/L   Chloride 93 (*) 96 - 112 mEq/L   CO2 18 (*) 19 - 32 mEq/L   Glucose, Bld 142 (*) 70 - 99 mg/dL   BUN 12  6 - 23 mg/dL   Creatinine, Ser 0.87  0.50 - 1.35 mg/dL   Calcium 9.4  8.4 - 10.5 mg/dL   Total Protein 8.1  6.0 - 8.3 g/dL   Albumin 3.9  3.5 - 5.2 g/dL   AST 31  0 - 37 U/L   ALT 22  0 - 53 U/L   Alkaline Phosphatase 68  39 - 117 U/L   Total Bilirubin 0.4  0.3 - 1.2 mg/dL   GFR calc non Af Amer >90  >90 mL/min   GFR calc Af Amer >90  >90 mL/min   Comment: (NOTE)     The eGFR has been calculated using the CKD EPI equation.     This  calculation has not been validated in all clinical situations.     eGFR's persistently <90 mL/min signify possible Chronic Kidney     Disease.  ETHANOL     Status: Abnormal   Collection Time    03/15/14  5:10 PM      Result Value Ref Range   Alcohol, Ethyl (B) 438 (*) 0 - 11 mg/dL   Comment:            LOWEST DETECTABLE LIMIT FOR     SERUM ALCOHOL IS 11 mg/dL     FOR MEDICAL PURPOSES ONLY     CRITICAL RESULT CALLED TO, READ BACK BY  AND VERIFIED WITH:     HAMBY,M AT 1805 ON [id number] BY POTEAT,S  SALICYLATE LEVEL     Status: Abnormal   Collection Time    03/15/14  5:10 PM      Result Value Ref Range   Salicylate Lvl <9.8 (*) 2.8 - 20.0 mg/dL  URINE RAPID DRUG SCREEN (HOSP PERFORMED)     Status: None   Collection Time    03/15/14  7:28 PM      Result Value Ref Range   Opiates NONE DETECTED  NONE DETECTED   Cocaine NONE DETECTED  NONE DETECTED   Benzodiazepines NONE DETECTED  NONE DETECTED   Amphetamines NONE DETECTED  NONE DETECTED   Tetrahydrocannabinol NONE DETECTED  NONE DETECTED   Barbiturates NONE DETECTED  NONE DETECTED   Comment:            DRUG SCREEN FOR MEDICAL PURPOSES     ONLY.  IF CONFIRMATION IS NEEDED     FOR ANY PURPOSE, NOTIFY LAB     WITHIN 5 DAYS.                LOWEST DETECTABLE LIMITS     FOR URINE DRUG SCREEN     Drug Class       Cutoff (ng/mL)     Amphetamine      1000     Barbiturate      200     Benzodiazepine   921     Tricyclics       194     Opiates          300     Cocaine          300     THC              50  URINALYSIS, ROUTINE W REFLEX MICROSCOPIC     Status: None   Collection Time    03/15/14  7:28 PM      Result Value Ref Range   Color, Urine YELLOW  YELLOW   APPearance CLEAR  CLEAR   Specific Gravity, Urine 1.006  1.005 - 1.030   pH 6.0  5.0 - 8.0   Glucose, UA NEGATIVE  NEGATIVE mg/dL   Hgb urine dipstick NEGATIVE  NEGATIVE   Bilirubin Urine NEGATIVE  NEGATIVE   Ketones, ur NEGATIVE  NEGATIVE mg/dL   Protein, ur NEGATIVE   NEGATIVE mg/dL   Urobilinogen, UA 0.2  0.0 - 1.0 mg/dL   Nitrite NEGATIVE  NEGATIVE   Leukocytes, UA NEGATIVE  NEGATIVE   Comment: MICROSCOPIC NOT DONE ON URINES WITH NEGATIVE PROTEIN, BLOOD, LEUKOCYTES, NITRITE, OR GLUCOSE <1000 mg/dL.    Physical Findings: AIMS:  , ,  ,  ,    CIWA:    COWS:     Treatment Plan Summary: Daily contact with patient to assess and evaluate symptoms and progress in treatment Medication management Alcohol detox protocol Plan: Pt  Is suicidal and drinking heavily. Still suicidal and having withdrawal symptoms. He needs admissions for safety, stabilization and withdrawal protocol  Medical Decision Making Problem Points:  Established problem, worsening (2) and Review of psycho-social stressors (1) Data Points:  Review or order clinical lab tests (1) Review and summation of old records (2) Review of medication regiment & side effects (2)  I certify that inpatient services furnished can reasonably be expected to improve the patient's condition.   Haskel Dewalt, [facility] 03/16/2014, 1:27 PM

## 2014-03-17 ENCOUNTER — Inpatient Hospital Stay (HOSPITAL_COMMUNITY)
Admission: AD | Admit: 2014-03-17 | Discharge: 2014-03-22 | DRG: 897 | Disposition: A | Discharge status: Rehabilitation Facility | Payer: Federal, State, Local not specified - Other | Source: Intra-hospital | Attending: Psychiatry | Admitting: Psychiatry

## 2014-03-17 DIAGNOSIS — F1210 Cannabis abuse, uncomplicated: Secondary | ICD-10-CM

## 2014-03-17 DIAGNOSIS — G4700 Insomnia, unspecified: Secondary | ICD-10-CM | POA: Diagnosis present

## 2014-03-17 DIAGNOSIS — F1020 Alcohol dependence, uncomplicated: Principal | ICD-10-CM | POA: Diagnosis present

## 2014-03-17 DIAGNOSIS — F1220 Cannabis dependence, uncomplicated: Secondary | ICD-10-CM | POA: Diagnosis present

## 2014-03-17 DIAGNOSIS — F411 Generalized anxiety disorder: Secondary | ICD-10-CM | POA: Diagnosis present

## 2014-03-17 DIAGNOSIS — F319 Bipolar disorder, unspecified: Secondary | ICD-10-CM | POA: Diagnosis present

## 2014-03-17 DIAGNOSIS — F17200 Nicotine dependence, unspecified, uncomplicated: Secondary | ICD-10-CM | POA: Diagnosis present

## 2014-03-17 DIAGNOSIS — I10 Essential (primary) hypertension: Secondary | ICD-10-CM | POA: Diagnosis present

## 2014-03-17 DIAGNOSIS — F259 Schizoaffective disorder, unspecified: Secondary | ICD-10-CM | POA: Diagnosis present

## 2014-03-17 DIAGNOSIS — R45851 Suicidal ideations: Secondary | ICD-10-CM

## 2014-03-17 MED ORDER — VITAMIN B-1 100 MG PO TABS
100.0000 mg | ORAL_TABLET | Freq: Every day | ORAL | Status: DC
  Administered 2014-03-17 – 2014-03-21 (×5): 100 mg via ORAL
  Filled 2014-03-17 (×7): qty 1

## 2014-03-17 MED ORDER — IBUPROFEN 600 MG PO TABS: 600.0000 mg | ORAL_TABLET | Freq: Three times a day (TID) | ORAL | Status: DC | PRN

## 2014-03-17 MED ORDER — TRAZODONE HCL 50 MG PO TABS: 50.0000 mg | ORAL_TABLET | Freq: Every evening | ORAL | Status: DC | PRN

## 2014-03-17 MED ORDER — CHLORDIAZEPOXIDE HCL 25 MG PO CAPS
ORAL_CAPSULE | ORAL | Status: AC
  Administered 2014-03-17: 25 mg
  Filled 2014-03-17: qty 1

## 2014-03-17 MED ORDER — ONDANSETRON 4 MG PO TBDP: 4.0000 mg | ORAL_TABLET | Freq: Four times a day (QID) | ORAL | Status: AC | PRN

## 2014-03-17 MED ORDER — LOPERAMIDE HCL 2 MG PO CAPS: 2.0000 mg | ORAL_CAPSULE | ORAL | Status: AC | PRN

## 2014-03-17 MED ORDER — HYDROXYZINE HCL 25 MG PO TABS
25.0000 mg | ORAL_TABLET | Freq: Four times a day (QID) | ORAL | Status: AC | PRN
  Administered 2014-03-17 – 2014-03-18 (×4): 25 mg via ORAL
  Filled 2014-03-17 (×3): qty 1

## 2014-03-17 MED ORDER — CHLORDIAZEPOXIDE HCL 25 MG PO CAPS
25.0000 mg | ORAL_CAPSULE | Freq: Four times a day (QID) | ORAL | Status: AC
  Administered 2014-03-17 (×3): 25 mg via ORAL
  Filled 2014-03-17 (×3): qty 1

## 2014-03-17 MED ORDER — LITHIUM CARBONATE ER 300 MG PO TBCR
300.0000 mg | EXTENDED_RELEASE_TABLET | Freq: Two times a day (BID) | ORAL | Status: DC
  Administered 2014-03-17 – 2014-03-21 (×9): 300 mg via ORAL
  Filled 2014-03-17 (×4): qty 1
  Filled 2014-03-17 (×2): qty 28
  Filled 2014-03-17 (×10): qty 1

## 2014-03-17 MED ORDER — CARBAMAZEPINE 200 MG PO TABS
200.0000 mg | ORAL_TABLET | Freq: Two times a day (BID) | ORAL | Status: DC
  Administered 2014-03-17 – 2014-03-18 (×3): 200 mg via ORAL
  Filled 2014-03-17 (×8): qty 1

## 2014-03-17 MED ORDER — BENZTROPINE MESYLATE 1 MG PO TABS
1.0000 mg | ORAL_TABLET | Freq: Two times a day (BID) | ORAL | Status: DC
  Administered 2014-03-17 – 2014-03-19 (×5): 1 mg via ORAL
  Filled 2014-03-17 (×8): qty 1

## 2014-03-17 MED ORDER — ACETAMINOPHEN 325 MG PO TABS
650.0000 mg | ORAL_TABLET | ORAL | Status: DC | PRN
  Administered 2014-03-17: 650 mg via ORAL
  Filled 2014-03-17: qty 2

## 2014-03-17 MED ORDER — ADULT MULTIVITAMIN W/MINERALS CH
1.0000 | ORAL_TABLET | Freq: Every day | ORAL | Status: DC
  Administered 2014-03-17 – 2014-03-21 (×5): 1 via ORAL
  Filled 2014-03-17 (×7): qty 1

## 2014-03-17 MED ORDER — MAGNESIUM HYDROXIDE 400 MG/5ML PO SUSP
30.0000 mL | Freq: Every day | ORAL | Status: DC | PRN
Start: 2014-03-17 — End: 2014-03-22

## 2014-03-17 MED ORDER — ALUM & MAG HYDROXIDE-SIMETH 200-200-20 MG/5ML PO SUSP: 30.0000 mL | ORAL | Status: DC | PRN

## 2014-03-17 MED ORDER — LAMOTRIGINE 25 MG PO TABS
25.0000 mg | ORAL_TABLET | Freq: Every day | ORAL | Status: DC
  Filled 2014-03-17 (×2): qty 1

## 2014-03-17 MED ORDER — CHLORDIAZEPOXIDE HCL 25 MG PO CAPS
25.0000 mg | ORAL_CAPSULE | Freq: Every day | ORAL | Status: AC
  Administered 2014-03-20: 25 mg via ORAL
  Filled 2014-03-17: qty 1

## 2014-03-17 MED ORDER — MAGNESIUM HYDROXIDE 400 MG/5ML PO SUSP: 30.0000 mL | Freq: Every day | ORAL | Status: DC | PRN

## 2014-03-17 MED ORDER — TRAZODONE HCL 100 MG PO TABS
100.0000 mg | ORAL_TABLET | Freq: Every evening | ORAL | Status: DC | PRN
  Administered 2014-03-17 – 2014-03-21 (×5): 100 mg via ORAL
  Filled 2014-03-17 (×2): qty 1
  Filled 2014-03-17: qty 14
  Filled 2014-03-17 (×3): qty 1

## 2014-03-17 MED ORDER — CHLORDIAZEPOXIDE HCL 25 MG PO CAPS
25.0000 mg | ORAL_CAPSULE | Freq: Three times a day (TID) | ORAL | Status: AC
  Administered 2014-03-18 (×3): 25 mg via ORAL
  Filled 2014-03-17 (×3): qty 1

## 2014-03-17 MED ORDER — CHLORDIAZEPOXIDE HCL 25 MG PO CAPS
25.0000 mg | ORAL_CAPSULE | ORAL | Status: AC
  Administered 2014-03-19 (×2): 25 mg via ORAL
  Filled 2014-03-17 (×2): qty 1

## 2014-03-17 MED ORDER — NICOTINE 21 MG/24HR TD PT24
21.0000 mg | MEDICATED_PATCH | Freq: Every day | TRANSDERMAL | Status: DC
  Filled 2014-03-17 (×2): qty 1

## 2014-03-17 MED ORDER — HALOPERIDOL 5 MG PO TABS
5.0000 mg | ORAL_TABLET | Freq: Two times a day (BID) | ORAL | Status: DC
  Filled 2014-03-17 (×2): qty 1

## 2014-03-17 NOTE — Progress Notes (Signed)
[facility] Group Notes:  (Nursing/MHT/Case Management/Adjunct)  Date:  03/17/2014  Time:  3:15PM  Type of Therapy:  Psychoeducational Skills  Participation Level:  Active  Participation Quality:  Appropriate and Attentive  Affect:  Appropriate  Cognitive:  Appropriate  Insight:  Appropriate  Engagement in Group:  Engaged and Supportive  Modes of Intervention:  Activity  Summary of Progress/Problems: Pts played a game of Pictionary using different coping skills. Pts were asked one coping skill they have learned while here at the hospital. Pt identified a coping skill of taking a time out  Caswell Corwin 03/17/2014, 6:50 PM

## 2014-03-17 NOTE — Progress Notes (Signed)
Patient ID: Curtis Cobb, male   DOB: [birth year], 43 y.o.   MRN: [id number] Pt to [facility] voluntarily after medical clearance.  Pt presented to ED with thoughts of SI with plan to cut wrists, increased depression and requesting alcohol detox.  Pt reports drinking a fifth daily, passive auditory hallucinations and increased depression, stating that he has not gotten over the death of his mother 2 yrs ago.   Pt is homeless, smokes, THC use and was here in Feb, with follow up at [facility].  Pt was noncompliant on his haldol and lamictal prescription stating that "it did not cooperate with my body".  Hx of bipolar 1, schizophrenia, htn, depression and anxiety.  Pt endorsed feelings of hopelessness and complained of withdrawel type symptoms on admission.  Denies HI / SI, reports passive auditory.

## 2014-03-17 NOTE — [facility] Suicide Risk Assessment (Signed)
Suicide Risk Assessment  Admission Assessment     Nursing information obtained from:  Patient Demographic factors:  Male;Caucasian Current Mental Status:  Self-harm thoughts Loss Factors:  Loss of significant relationship;Decline in physical health;Financial problems / change in socioeconomic status Historical Factors:  Prior suicide attempts;Anniversary of important loss;Impulsivity Risk Reduction Factors:  Positive social support;Positive therapeutic relationship Total Time spent with patient: 20 minutes  Patient relapsed to alcohol use. He reports that he does not have a history of Schizophrenia. He stopped lamictal and Haldol (due to muscle stiffness). He states he stopped using marijuana.   CLINICAL FACTORS:   Depression:   Anhedonia Comorbid alcohol abuse/dependence Hopelessness Alcohol/Substance Abuse/Dependencies Unstable or Poor Therapeutic Relationship Previous Psychiatric Diagnoses and Treatments  Psychiatric Specialty Exam:     Blood pressure 115/80, pulse 112, temperature 97.6 F (36.4 C), temperature source Oral, height 6\' 3"  (1.905 m), weight 87.998 kg (194 lb), SpO2 98.00%.Body mass index is 24.25 kg/(m^2).  General Appearance: Casual  Eye Contact::  Fair  Speech:  Clear and Coherent  Volume:  Normal  Mood:  Depressed and Hopeless  Affect:  Appropriate and Congruent  Thought Process:  Coherent, Linear and Logical  Orientation:  Full (Time, Place, and Person)  Thought Content:  WDL  Suicidal Thoughts:  Contracts for safety on the unit.  Homicidal Thoughts:  No  Memory:  Immediate;   Good Recent;   Good Remote;   Good  Judgement:  Fair  Insight:  Fair  Psychomotor Activity:  Normal  Concentration:  Fair  Recall:  Good  Fund of Knowledge:Fair  Language: Poor  Akathisia:  No  Handed:  Right  AIMS (if indicated):   Not indicated  Assets:  Communication Skills Desire for Improvement Financial Resources/Insurance Transportation  Sleep:   Poor    Musculoskeletal: Strength & Muscle Tone: within normal limits Gait & Station: normal Patient leans: N/A  COGNITIVE FEATURES THAT CONTRIBUTE TO RISK:  Closed-mindedness Polarized thinking    SUICIDE RISK:   Mild:  Suicidal ideation of limited frequency, intensity, duration, and specificity.  There are no identifiable plans, no associated intent, mild dysphoria and related symptoms, good self-control (both objective and subjective assessment), few other risk factors, and identifiable protective factors, including available and accessible social support.  PLAN OF CARE: Treatment Plan/Recommendations: Plan: Review of chart, vital signs, medications, and notes.  1-Admit for crisis management and stabilization. Estimated length of stay 5-7 days past his current stay of 1  2-Individual and group therapy encouraged  3-Medication management for alcohol withdrawal/detox and anxiety to reduce current symptoms to base line and improve the patient's overall level of functioning:  1. Lithium 300 mg daily. 2. Stop Haldol due to EPS. 3. Increase trazodone 100 mg 4. Stop lamictal-does not work. 5- COntinue librium protocol.   4-Coping skills for substance abuse, and anxiety developing--  5-Continue crisis stabilization and management  6-Address health issues--monitoring vital signs, stable  7-Treatment plan in progress to prevent relapse of alcohol abuse and anxiety  8-Psychosocial education regarding relapse prevention and self-care  8-Health care follow up as needed for any health concerns  9-Call for consult with hospitalist for additional specialty patient services as needed.   I certify that inpatient services furnished can reasonably be expected to improve the patient's condition.  Curtis Cobb 03/17/2014, 3:09 PM

## 2014-03-17 NOTE — ED Notes (Signed)
Tried to call report-nursing doing admissions currently occupied. Will call me back.

## 2014-03-17 NOTE — Tx Team (Signed)
Initial Interdisciplinary Treatment Plan  PATIENT STRENGTHS: (choose at least two) Ability for insight Communication skills General fund of knowledge Motivation for treatment/growth Physical Health  PATIENT STRESSORS: Loss of Mother Substance abuse Traumatic event   PROBLEM LIST: Problem List/Patient Goals Date to be addressed Date deferred Reason deferred Estimated date of resolution  Suicidal ideation, alcohol detox (Pt presented to ED with SI with plan to cut wrists, increased alcohol use and depression, stating that he has not gotten over the death of his mother 2 years ago).    Discharge                                                   DISCHARGE CRITERIA:  Ability to meet basic life and health needs Adequate post-discharge living arrangements Improved stabilization in mood, thinking, and/or behavior Medical problems require only outpatient monitoring Motivation to continue treatment in a less acute level of care Need for constant or close observation no longer present Reduction of life-threatening or endangering symptoms to within safe limits Safe-care adequate arrangements made Withdrawal symptoms are absent or subacute and managed without 24-hour nursing intervention  PRELIMINARY DISCHARGE PLAN: Outpatient therapy Return to previous work or school arrangements  PATIENT/FAMIILY INVOLVEMENT: This treatment plan has been presented to and reviewed with the patient, Curtis Cobb, and/or family member.  The patient and family have been given the opportunity to ask questions and make suggestions.  [name] 03/17/2014, 2:00 PM

## 2014-03-17 NOTE — Progress Notes (Signed)
Adult Psychoeducational Group Note  Date:  03/17/2014 Time:  1315  Group Topic/Focus:  Relapse Prevention Planning:   The focus of this group is to define relapse and discuss the need for planning to combat relapse.  Participation Level:  Did Not Attend   Barbette Merino Destine Ambroise Shari Prows 03/17/2014, 2:47 PM
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
## 2014-03-17 NOTE — ED Notes (Signed)
[facility] here to transport to [facility]. Report given to Will RN. Accepted by Dr.Lugo to room 305-1. Belongings bag x2 plus 1 bag of hygiene items from here given to [employer] driver. Denies pain. No complaints voiced. +SI does contract for safety. Denies HI and AVH. Ambulatory without difficulty. Mental health tech to accompany patient to [facility].

## 2014-03-18 MED ORDER — HYDROXYZINE HCL 50 MG PO TABS
25.0000 mg | ORAL_TABLET | ORAL | Status: DC | PRN
  Filled 2014-03-18: qty 1
  Filled 2014-03-18: qty 20

## 2014-03-18 MED ORDER — GABAPENTIN 100 MG PO CAPS
200.0000 mg | ORAL_CAPSULE | Freq: Three times a day (TID) | ORAL | Status: DC
  Administered 2014-03-18 – 2014-03-19 (×4): 200 mg via ORAL
  Filled 2014-03-18 (×10): qty 2

## 2014-03-18 MED ORDER — HALOPERIDOL 5 MG PO TABS
5.0000 mg | ORAL_TABLET | Freq: Every day | ORAL | Status: DC
  Administered 2014-03-18: 5 mg via ORAL
  Filled 2014-03-18 (×4): qty 1

## 2014-03-18 NOTE — Progress Notes (Signed)
D.  Pt pleasant on approach, denies complaints other than moderate withdrawal symptoms at this time.  Positive for evening AA group, interacting appropriately within milieu.  Denies SI/HI/hallucinations at this time.  A.  Support and encouragement offered, medication given as ordered for withdrawal.  R.  Pt remains safe on unit, will continue to monitor.

## 2014-03-18 NOTE — Progress Notes (Signed)
Patient ID: Curtis Cobb, male   DOB: October 10, 1971, 43 y.o.   MRN: [id number] D: patient states his sleep and appetite is improving.  He reports tremors, chilling and cravings as withdrawal symptoms.  Patient also complains of headache and dizziness.  He reports passive SI, however, he contracts for safety.  He denies any HI/AVH.  He is attending groups and participating in his treatment.  He is vested in his treatment.  A: continue to monitor medication management and MD orders.  Safety checks completed every 15 minutes per protocol.  R: patient is receptive to staff; his behavior is appropriate.

## 2014-03-18 NOTE — [facility] Group Notes (Signed)
[facility] Group Notes:  (Nursing/MHT/Case Management/Adjunct)  Date:  03/18/2014  Time:  10:55 AM  Type of Therapy:  Psychoeducational Skills  Participation Level:  Active  Participation Quality:  Appropriate  Affect:  Appropriate  Cognitive:  Appropriate  Insight:  Appropriate  Engagement in Group:  Engaged  Modes of Intervention:  Discussion  Summary of Progress/Problems: Pt did attend self inventory group, pt reported that he was negative SI/HI, no AH/VH noted. Pt rated his depression as a 5, and his helplessness/hopelessness as a 5.     Jacquelyne Balint Shanta 03/18/2014, 10:55 AM

## 2014-03-18 NOTE — Progress Notes (Signed)
[facility] Group Notes:  (Nursing/MHT/Case Management/Adjunct)  Date:  03/18/2014  Time:  3:15 PM  Type of Therapy:  Psychoeducational Skills  Participation Level:  Active  Participation Quality:  Appropriate, Attentive and Supportive  Affect:  Appropriate  Cognitive:  Appropriate  Insight:  Appropriate  Engagement in Group:  Engaged and Supportive  Modes of Intervention:  Activity  Summary of Progress/Problems: Pts played a game using the Therapeutic Activity Ball. Pts were asked one thing they have learned while at the hospital. Pt stated he has learned that if he is not in recovery then he is powerless and that working the AA steps empower him and helps him find himself.  Caswell Corwin 03/18/2014, 6:29 PM

## 2014-03-18 NOTE — Progress Notes (Signed)
D. Pt has been up and has been visible in milieu today, attending and participating in various milieu activities. Pt has spoken about his ordeals with his addiction and how he has hit bottom and how he is looking for the motivation to change the things that he has been doing. Pt ruminating about the decisions that he has made in his past and trying to figure out where to go from here. Pt is tremulous and having anxiety and agitation associated with his withdrawal/ A. Support and encouragement provided, medication education given. R. Pt verbalized understanding, safety maintained.

## 2014-03-18 NOTE — Progress Notes (Signed)
[facility] MD Progress Note  03/18/2014 5:34 PM Curtis Cobb  MRN:  [id number]  Subjective:  Mong remains very anxious. He says "My future is uncertain, and that does not help my anxiety. Can I have something for anxiety?"  O: Guy is highly anxious. He complains of mood swings, fear of the unknown, his future being uncertain. He is passing within the hall ways, back and forth. Says he is too anxious to stay through group sessions. Wants something for anxiety.  Diagnosis:   DSM5: Schizophrenia Disorders:  NA Obsessive-Compulsive Disorders:  NA Trauma-Stressor Disorders:  NA Substance/Addictive Disorders:  Alcohol Related Disorder - Severe (303.90) Depressive Disorders:  Schizoaffective disorder Total Time spent with patient: 30 minutes  Axis I: Alcohol dependence, Schizoaffective disorder Axis II: Deferred Axis III:  Past Medical History  Diagnosis Date  . Bipolar 1 disorder   . Schizophrenia   . Hypertension   . Depression   . Anxiety    Axis IV: economic problems, housing problems, occupational problems and Alcoholism, chronic Axis V: 41-50 serious symptoms  ADL's:  Impaired  Sleep: Good  Appetite:  Fair  Suicidal Ideation:  Plan:  Denies Intent:  Denies Means:  Denies Homicidal Ideation:  Plan:  Denies Intent:  Denies Means:  Denies AEB (as evidenced by):  Psychiatric Specialty Exam: Physical Exam  Psychiatric: His speech is normal and behavior is normal. Judgment and thought content normal. His mood appears anxious. Cognition and memory are normal. He exhibits a depressed mood.    Review of Systems  Constitutional: Positive for malaise/fatigue and diaphoresis.  HENT: Negative.   Eyes: Negative.   Respiratory: Negative.   Cardiovascular: Negative.   Gastrointestinal: Negative.   Genitourinary: Negative.   Musculoskeletal: Negative.   Skin: Negative.   Neurological: Positive for tremors and weakness.  Endo/Heme/Allergies: Negative.   Psychiatric/Behavioral:  Positive for depression and substance abuse. Negative for suicidal ideas, hallucinations and memory loss. The patient is nervous/anxious and has insomnia.     Blood pressure 136/76, pulse 99, temperature 97.6 F (36.4 C), temperature source Oral, resp. rate 18, height 6\' 3"  (1.905 m), weight 87.998 kg (194 lb), SpO2 98.00%.Body mass index is 24.25 kg/(m^2).  General Appearance: Disheveled  Eye ::  Fair  Speech:  Clear and Coherent  Volume:  Normal  Mood:  Anxious, Depressed and Hopeless  Affect:  Congruent  Thought Process:  Coherent and Intact  Orientation:  Full (Time, Place, and Person)  Thought Content:  Rumination  Suicidal Thoughts:  No  Homicidal Thoughts:  No  Memory:  Immediate;   Good Recent;   Good Remote;   Good  Judgement:  Fair  Insight:  Present  Psychomotor Activity:  Restlessness and Tremor  Concentration:  Fair  Recall:  Good  Fund of Knowledge:Fair  Language: Good  Akathisia:  No  Handed:  Right  AIMS (if indicated):     Assets:  Desire for Improvement  Sleep:  Number of Hours: 6.75   Musculoskeletal: Strength & Muscle Tone: within normal limits Gait & Station: normal Patient leans: N/A  Current Medications: Current Facility-Administered Medications  Medication Dose Route Frequency Provider Last Rate Last Dose  . acetaminophen (TYLENOL) tablet 650 mg  650 mg Oral Q4H PRN [id number], MD   650 mg at 03/17/14 1129  . alum & mag hydroxide-simeth (MAALOX/MYLANTA) 200-200-20 MG/5ML suspension 30 mL  30 mL Oral PRN 07-06-2001, MD      . benztropine (COGENTIN) tablet 1 mg  1 mg Oral BID Thresa Ross, MD  1 mg at 03/18/14 1720  . carbamazepine (TEGRETOL) tablet 200 mg  200 mg Oral BID AC Sanjuana Kava, NP   200 mg at 03/18/14 1720  . [START ON 03/19/2014] chlordiazePOXIDE (LIBRIUM) capsule 25 mg  25 mg Oral BH-qamhs Thresa Ross, MD       Followed by  . [START ON 03/20/2014] chlordiazePOXIDE (LIBRIUM) capsule 25 mg  25 mg Oral Daily Thresa Ross,  MD      . hydrOXYzine (ATARAX/VISTARIL) tablet 25 mg  25 mg Oral Q6H PRN Thresa Ross, MD   25 mg at 03/18/14 1613  . ibuprofen (ADVIL,MOTRIN) tablet 600 mg  600 mg Oral Q8H PRN Thresa Ross, MD      . lithium carbonate (LITHOBID) CR tablet 300 mg  300 mg Oral Q12H Larena Sox, MD   300 mg at 03/18/14 0740  . loperamide (IMODIUM) capsule 2-4 mg  2-4 mg Oral PRN Thresa Ross, MD      . magnesium hydroxide (MILK OF MAGNESIA) suspension 30 mL  30 mL Oral Daily PRN Nanine Means, NP      . magnesium hydroxide (MILK OF MAGNESIA) suspension 30 mL  30 mL Oral Daily PRN Thresa Ross, MD      . multivitamin with minerals tablet 1 tablet  1 tablet Oral Daily Thresa Ross, MD   1 tablet at 03/18/14 0740  . ondansetron (ZOFRAN-ODT) disintegrating tablet 4 mg  4 mg Oral Q6H PRN Thresa Ross, MD      . thiamine (VITAMIN B-1) tablet 100 mg  100 mg Oral Daily Thresa Ross, MD   100 mg at 03/18/14 0740  . traZODone (DESYREL) tablet 100 mg  100 mg Oral QHS PRN Larena Sox, MD   100 mg at 03/17/14 2131    Lab Results: No results found for this or any previous visit (from the past 48 hour(s)).  Physical Findings: AIMS: Facial and Oral Movements Muscles of Facial Expression: None, normal Lips and Perioral Area: None, normal Jaw: None, normal Tongue: None, normal,Extremity Movements Upper (arms, wrists, hands, fingers): None, normal Lower (legs, knees, ankles, toes): None, normal, Trunk Movements Neck, shoulders, hips: None, normal, Overall Severity Severity of abnormal movements (highest score from questions above): None, normal Incapacitation due to abnormal movements: None, normal Patient's awareness of abnormal movements (rate only patient's report): No Awareness, Dental Status Current problems with teeth and/or dentures?: No Does patient usually wear dentures?: No  CIWA:  CIWA-Ar Total: 8 COWS:     Treatment Plan Summary: Daily contact with patient to assess and evaluate symptoms and  progress in treatment Medication management  Plan:  1. Continue crisis management and stabilization.  2. Medication management: discontinue tegretol 200 mg, continue lithium Carbonate 300 mg for mood stabilization,  Haldol 5 mg, and Benztropine for mood control, add Neurontin 200 mg tid for substance withdrawal syndrome.  3. Encouraged patient to attend groups and participate in group counseling sessions and activities.  4. Discharge plan in progress.  5. Continue current treatment plan.  6. Address health issues: Vitals reviewed and stable.   Medical Decision Making Problem Points:  Review of last therapy session (1) and Review of psycho-social stressors (1) Data Points:  Review of medication regiment & side effects (2) Review of new medications or change in dosage (2)  I certify that inpatient services furnished can reasonably be expected to improve the patient's condition.   Sanjuana Kava, PMHNP-BC 03/18/2014, 5:34 PM  Attending Addendum:  I have discussed this patient with the  above provider. I have reviewed the history, physical exam, assessment and plan and agree with the above.  Jacqulyn Cane, M.D.  03/18/2014 9:47 PM

## 2014-03-18 NOTE — [facility] Group Notes (Signed)
[facility] LCSW Group Therapy  03/18/2014 10:00 AM  Type of Therapy:  Group Therapy  Participation Level:  Active  Participation Quality:  Attentive and Sharing  Affect:  Appropriate  Cognitive:  Alert and Oriented  Insight:  Developing  Engagement in Therapy:  Developing  Modes of Intervention:  Clarification, Exploration, Rapport Building, Socialization and Support  Summary of Progress/Problems:   Summary of Progress/Problems: The main focus of today's process group was to identify the patient's current support system and decide on other supports that can be put in place. An emphasis was placed on using counselor, doctor, therapy groups, 12-step groups, and problem-specific support groups to expand supports. There was also an extensive discussion about what constitutes a healthy support versus an unhealthy support. Curtis Cobb was attentive throughout group although obviously experiencing some symptoms of withdrawal. Patient shared that he is able to now see the supportive environment of treatment facility is something that was helpful and he needs to try and duplicate as part of his daily living. Patient is looking forwaerd to reconnecting with family at reunion in September and acknowledges that being sober will be deciding factor of whether or not he attends  [name], [name]

## 2014-03-18 NOTE — Progress Notes (Signed)
[facility] Group Notes:  (Nursing/MHT/Case Management/Adjunct)  Date:  03/18/2014  Time:  2100 Type of Therapy:  wrap up group  Participation Level:  Active  Participation Quality:  Appropriate, Attentive, Sharing and Supportive  Affect:  Appropriate and Flat  Cognitive:  Appropriate  Insight:  Improving  Engagement in Group:  Engaged  Modes of Intervention:  Clarification, Education and Support  Summary of Progress/Problems: Pt reports having trust issues from his past but enjoyed opening up and talking with some of his peer patients today. Pt reports having no supports and plans to [place] a sponsor after treatment. Pt wants to go to [facility] after discharge.   Shelah Lewandowsky 03/18/2014, 10:30 PM

## 2014-03-18 NOTE — Progress Notes (Signed)
D.  Pt pleasant on approach but anxious.  Pt states that his medication has been changed without this being discussed with him.  Pt was previously taking Tegretol and this was discontinued with Haldol and Neurontin added.  Pt is not pleased with this and wishes to discuss his concerns with doctor tomorrow.  Denies SI/HI/hallucinations at this time.  Positive for evening wrap up group, see group notes.  Interacting appropriately within milieu.  A.  Support and encouragement offered  R.  Pt remains safe on unit, will continue to monitor.

## 2014-03-19 MED ORDER — AMITRIPTYLINE HCL 25 MG PO TABS
25.0000 mg | ORAL_TABLET | Freq: Every day | ORAL | Status: DC
  Administered 2014-03-19 – 2014-03-21 (×3): 25 mg via ORAL
  Filled 2014-03-19 (×4): qty 1
  Filled 2014-03-19: qty 14
  Filled 2014-03-19: qty 1

## 2014-03-19 MED ORDER — CARBAMAZEPINE ER 200 MG PO TB12
200.0000 mg | ORAL_TABLET | Freq: Two times a day (BID) | ORAL | Status: DC
Start: 2014-03-19 — End: 2014-03-22
  Administered 2014-03-19 – 2014-03-21 (×5): 200 mg via ORAL
  Filled 2014-03-19 (×5): qty 1
  Filled 2014-03-19 (×2): qty 28
  Filled 2014-03-19 (×5): qty 1

## 2014-03-19 MED ORDER — ENSURE COMPLETE PO LIQD
237.0000 mL | Freq: Two times a day (BID) | ORAL | Status: DC
  Administered 2014-03-19 – 2014-03-21 (×4): 237 mL via ORAL

## 2014-03-19 NOTE — [facility] Group Notes (Signed)
[facility] LCSW Group Therapy  03/19/2014 3:38 PM  Type of Therapy:  Group Therapy  Participation Level:  Did Not Attend-pt in room sleeping/did not attend afternoon therapy group.   Smart, Hesham Womac LCSWA  03/19/2014, 3:38 PM

## 2014-03-19 NOTE — [facility] Group Notes (Signed)
[facility] LCSW Aftercare Discharge Planning Group Note   03/19/2014 9:57 AM  Participation Quality:  DID NOT ATTEND-pt in room sleeping/refused to attend morning d/c planning group.   Smart, [employer]

## 2014-03-19 NOTE — Progress Notes (Signed)
Adult Psychoeducational Group Note  Date:  03/19/2014 Time:  11:21 AM  Group Topic/Focus:  Wellness Toolbox:   The focus of this group is to discuss various aspects of wellness, balancing those aspects and exploring ways to increase the ability to experience wellness.  Patients will create a wellness toolbox for use upon discharge.  Participation Level:  Minimal  Participation Quality:  Appropriate  Affect:  Appropriate  Cognitive:  Alert and Appropriate  Insight: Improving  Engagement in Group:  Limited  Modes of Intervention:  Discussion  Additional Comments:  Kellie Moor 03/19/2014, 11:21 AM

## 2014-03-19 NOTE — [facility] Counselor (Signed)
Adult Psychosocial Assessment Update Interdisciplinary Team  Previous [facility] admissions/discharges:  Admissions Discharges  Date: 03/17/14 Date: unknown at this time   Date: 02/12/14 Date: 02/19/14  Date: Date:  Date: Date:  Date: Date:   Changes since the last Psychosocial Assessment (including adherence to outpatient mental health and/or substance abuse treatment, situational issues contributing to decompensation and/or relapse). Curtis Cobb is 43 years old. caucasian male, recently discharged from this hospital to follow-up at [facility]. He reports, "I went to the hospital a couple of days ago for alcohol detox and help for my depression. I have been drinking a lot of liquor, a fifth of Vodka daily x 10 days. I have been an alcoholic for 20 years. My longest sobriety was 31/2 years. I relapsed 1 year ago after my mother passed. I recently broke up with my fiance. That put me into a deep end. I was not sleeping at night. I was at a time up for 6 days. My mind is racing. I have bad tremors. I have been an opiate addict off and on, have not used recently. I was at [facility] in [place] in 2011. Pt also came to [facility] last month for detox/mood stabilization/AVH. This time, I would like a long term treatment after discharge".             Discharge Plan 1. Will you be returning to the same living situation after discharge?   Yes: No:      If no, what is your plan?    Pt currently not attending d/c planning group as he reports experiencing severe withdrawals. Pt reporting that he is interested in long term treatment. CSW assessing for appropriate referrals at this time.        2. Would you like a referral for services when you are discharged? Yes:     If yes, for what services?  No:       CSW assessing/ [facility] and outpatient med management. Pt currently not attending d/c planning and is reporting severe withdrawal symptoms.  CSW to meet with pt this afternoon or tomorrow AM to discuss aftercare further.        Summary and Recommendations (to be completed by the evaluator) Pt is 43 year old male who presents as homeless [place] Pt presents to [facility] for ETOH detox, mood stabilization, medication management, and passive SI. Recommendations for pt include: crisis stabilization, therapeutic milieu, encourage group attendance and participation, librium taper for withdrawals, medication management for mood stabilization, and development of comprehensive mental wellness/sobriety plan. CSW assessing for appropriate referrals at this time.                        Signature:  Micah Noel 03/19/2014 11:43 AM

## 2014-03-19 NOTE — Progress Notes (Signed)
Patient ID: Curtis Cobb, male   DOB: 09-23-1971, 43 y.o.   MRN: [id number]  D: Patient lying in bed on approach this am. Reports his mood and withdrawal symptoms are better. Currently denies any SI at this time. Talked about not wanting to be on Haldol. He is still scheduled Haldol at bedtime but doesn't want to take. A: Staff will monitor on q 15 minute checks, follow treatment plan, and give medications as ordered. R: Cooperative at this time.

## 2014-03-19 NOTE — Tx Team (Signed)
Interdisciplinary Treatment Plan Update (Adult)  Date: 03/19/2014   Time Reviewed: 10:59 AM  Progress in Treatment:  Attending groups: No.  Participating in groups:  No.  Taking medication as prescribed: Yes  Tolerating medication: Yes  Family/Significant othe contact made: Not yet. SPE required for pt.  Patient understands diagnosis: Yes, AEB seeking treatment for ETOH detox, medication management, SI, and mood stabilization.  Discussing patient identified problems/goals with staff: Yes  Medical problems stabilized or resolved: Yes  Denies suicidal/homicidal ideation: passive SI reported. Able to contract for safety.  Patient has not harmed self or Others: Yes  New problem(s) identified:  Discharge Plan or Barriers: Pt  Additional comments: Curtis Cobb is 43 years old. caucasian male, recently discharged from this hospital to follow-up at [facility]. He reports, "I went to the hospital a couple of days ago for alcohol detox and help for my depression. I have been drinking a lot of liquor, a fifth of Vodka daily x 10 days. I have been an alcoholic for 20 years. My longest sobriety was 31/2 years. I relapsed 1 year ago after my mother passed. I recently broke up with my fiance. That put me into a deep end. I was not sleeping at night. I was at a time up for 6 days. My mind is racing. I have bad tremors. I have been an opiate addict off and on, have not used recently. I was at the [facility] in [place] in 2011. This time, I would like a long term treatment after discharge". Reason for Continuation of Hospitalization: Medication management Mood stabilization Librium taper-withdrawals Estimated length of stay: 2-4 days  For review of initial/current patient goals, please see plan of care.  Attendees:  Patient:    Family:    Physician:    Nursing: Maury Dus 03/19/2014 10:59 AM   Clinical Social Worker Kyeisha Janowicz Smart, LCSWA  03/19/2014 10:59 AM   Other: Victorino Dike RN  03/19/2014 10:59 AM    Other:    Other:    Other:    Scribe for Treatment Team:  Trula Slade LCSWA  03/19/2014 10:59 AM

## 2014-03-19 NOTE — Progress Notes (Signed)
Patient ID: Curtis Cobb, male   DOB: 10/31/1971, 43 y.o.   MRN: [id number] [facility] MD Progress Note  03/19/2014 4:45 PM Curtis Cobb  MRN:  [id number]  Subjective:  Curtis Cobb says "I'm feeling worse today, I don't want Neurontin, it never works for me. It makes very tired and sluggish. I need my Tegretol back together with Lithium, I do much better. I need something to help me sleep. Elavil has helped in the past?"  O: Curtis Cobb is highly anxious. He complains of mood swings, fear of the unknown, his future being uncertain. He is paciing within the hall ways, back and forth. Says he is too anxious to stay through group sessions. Wants something for sleep.  Diagnosis:   DSM5: Schizophrenia Disorders:  NA Obsessive-Compulsive Disorders:  NA Trauma-Stressor Disorders:  NA Substance/Addictive Disorders:  Alcohol Related Disorder - Severe (303.90) Depressive Disorders:  Schizoaffective disorder Total Time spent with patient: 30 minutes  Axis I: Alcohol dependence, Schizoaffective disorder Axis II: Deferred Axis III:  Past Medical History  Diagnosis Date  . Bipolar 1 disorder   . Schizophrenia   . Hypertension   . Depression   . Anxiety    Axis IV: economic problems, housing problems, occupational problems and Alcoholism, chronic Axis V: 41-50 serious symptoms  ADL's:  Impaired  Sleep: Good  Appetite:  Fair  Suicidal Ideation:  Plan:  Denies Intent:  Denies Means:  Denies Homicidal Ideation:  Plan:  Denies Intent:  Denies Means:  Denies AEB (as evidenced by):  Psychiatric Specialty Exam: Physical Exam  Psychiatric: His speech is normal and behavior is normal. Judgment and thought content normal. His mood appears anxious. Cognition and memory are normal. He exhibits a depressed mood.    Review of Systems  Constitutional: Positive for malaise/fatigue and diaphoresis.  HENT: Negative.   Eyes: Negative.   Respiratory: Negative.   Cardiovascular: Negative.   Gastrointestinal: Negative.    Genitourinary: Negative.   Musculoskeletal: Negative.   Skin: Negative.   Neurological: Positive for tremors and weakness.  Endo/Heme/Allergies: Negative.   Psychiatric/Behavioral: Positive for depression and substance abuse. Negative for suicidal ideas, hallucinations and memory loss. The patient is nervous/anxious and has insomnia.     Blood pressure 109/74, pulse 92, temperature 97.5 F (36.4 C), temperature source Oral, resp. rate 16, height 6\' 3"  (1.905 m), weight 87.998 kg (194 lb), SpO2 98.00%.Body mass index is 24.25 kg/(m^2).  General Appearance: Disheveled  Eye ::  Fair  Speech:  Clear and Coherent  Volume:  Normal  Mood:  Anxious, Depressed and Hopeless  Affect:  Congruent  Thought Process:  Coherent and Intact  Orientation:  Full (Time, Place, and Person)  Thought Content:  Rumination  Suicidal Thoughts:  No  Homicidal Thoughts:  No  Memory:  Immediate;   Good Recent;   Good Remote;   Good  Judgement:  Fair  Insight:  Present  Psychomotor Activity:  Restlessness and Tremor  Concentration:  Fair  Recall:  Good  Fund of Knowledge:Fair  Language: Good  Akathisia:  No  Handed:  Right  AIMS (if indicated):     Assets:  Desire for Improvement  Sleep:  Number of Hours: 6.5   Musculoskeletal: Strength & Muscle Tone: within normal limits Gait & Station: normal Patient leans: N/A  Current Medications: Current Facility-Administered Medications  Medication Dose Route Frequency Provider Last Rate Last Dose  . acetaminophen (TYLENOL) tablet 650 mg  650 mg Oral Q4H PRN [id number], MD   650 mg at 03/17/14 1129  .  alum & mag hydroxide-simeth (MAALOX/MYLANTA) 200-200-20 MG/5ML suspension 30 mL  30 mL Oral PRN Thresa Ross, MD      . amitriptyline (ELAVIL) tablet 25 mg  25 mg Oral QHS Sanjuana Kava, NP      . carbamazepine (TEGRETOL XR) 12 hr tablet 200 mg  200 mg Oral BID Sanjuana Kava, NP      . chlordiazePOXIDE (LIBRIUM) capsule 25 mg  25 mg Oral BH-qamhs  Thresa Ross, MD   25 mg at 03/19/14 0817   Followed by  . [START ON 03/20/2014] chlordiazePOXIDE (LIBRIUM) capsule 25 mg  25 mg Oral Daily Thresa Ross, MD      . feeding supplement (ENSURE COMPLETE) (ENSURE COMPLETE) liquid 237 mL  237 mL Oral BID BM Jeoffrey Massed, RD   237 mL at 03/19/14 1433  . hydrOXYzine (ATARAX/VISTARIL) tablet 25 mg  25 mg Oral Q6H PRN Thresa Ross, MD   25 mg at 03/18/14 2141  . hydrOXYzine (ATARAX/VISTARIL) tablet 25 mg  25 mg Oral Q4H PRN Sanjuana Kava, NP      . ibuprofen (ADVIL,MOTRIN) tablet 600 mg  600 mg Oral Q8H PRN Thresa Ross, MD      . lithium carbonate (LITHOBID) CR tablet 300 mg  300 mg Oral Q12H Larena Sox, MD   300 mg at 03/19/14 0817  . loperamide (IMODIUM) capsule 2-4 mg  2-4 mg Oral PRN Thresa Ross, MD      . magnesium hydroxide (MILK OF MAGNESIA) suspension 30 mL  30 mL Oral Daily PRN Nanine Means, NP      . magnesium hydroxide (MILK OF MAGNESIA) suspension 30 mL  30 mL Oral Daily PRN Thresa Ross, MD      . multivitamin with minerals tablet 1 tablet  1 tablet Oral Daily Thresa Ross, MD   1 tablet at 03/19/14 0817  . ondansetron (ZOFRAN-ODT) disintegrating tablet 4 mg  4 mg Oral Q6H PRN Thresa Ross, MD      . thiamine (VITAMIN B-1) tablet 100 mg  100 mg Oral Daily Thresa Ross, MD   100 mg at 03/19/14 0817  . traZODone (DESYREL) tablet 100 mg  100 mg Oral QHS PRN Larena Sox, MD   100 mg at 03/18/14 2137    Lab Results: No results found for this or any previous visit (from the past 48 hour(s)).  Physical Findings: AIMS: Facial and Oral Movements Muscles of Facial Expression: None, normal Lips and Perioral Area: None, normal Jaw: None, normal Tongue: None, normal,Extremity Movements Upper (arms, wrists, hands, fingers): None, normal Lower (legs, knees, ankles, toes): None, normal, Trunk Movements Neck, shoulders, hips: None, normal, Overall Severity Severity of abnormal movements (highest score from questions above):  None, normal Incapacitation due to abnormal movements: None, normal Patient's awareness of abnormal movements (rate only patient's report): No Awareness, Dental Status Current problems with teeth and/or dentures?: No Does patient usually wear dentures?: No  CIWA:  CIWA-Ar Total: 4 COWS:     Treatment Plan Summary: Daily contact with patient to assess and evaluate symptoms and progress in treatment Medication management  Plan:  1. Continue crisis management and stabilization.  2. Medication management: Re-order tegretol 200 mg bid for mood stabilization, continue lithium Carbonate 300 mg for mood stabilization,  Discontinue Haldol 5 mg and Benztropine for mood control, D/C Neurontin 200 mg tid for substance withdrawal syndrome, Initiate elavil 25 mg Q bedtime for depression/insomnia..  3. Encouraged patient to attend groups and participate in group counseling sessions and  activities.  4. Discharge plan in progress.  5. Continue current treatment plan.  6. Address health issues: Vitals reviewed and stable.   Medical Decision Making Problem Points:  Review of last therapy session (1) and Review of psycho-social stressors (1) Data Points:  Review of medication regiment & side effects (2) Review of new medications or change in dosage (2)  I certify that inpatient services furnished can reasonably be expected to improve the patient's condition.   Sanjuana Kava, PMHNP-BC 03/19/2014, 4:45 PM  Attending Addendum:  I have discussed this patient with the above provider. I have reviewed the history, physical exam, assessment and plan and agree with the above.  Jacqulyn Cane, M.D.  03/19/2014 4:45 PM

## 2014-03-19 NOTE — Progress Notes (Signed)
NUTRITION ASSESSMENT  Pt identified as at risk on the Malnutrition Screen Tool  INTERVENTION: 1. Educated patient on the importance of nutrition and encouraged intake of food and beverages. 2. Provided Handout, "Sobriety Nutrition" 3. Supplements: MVI and thiamine daily.  Ensure Complete po BID, each supplement provides 350 kcal and 13 grams of protein   NUTRITION DIAGNOSIS: Unintentional weight loss related to sub-optimal intake as evidenced by pt report.   Goal: Pt to meet >/= 90% of their estimated nutrition needs.  Monitor:  PO intake  Assessment:  Patient admitted with etoh abuse, major depression, cannabis abuse and schizoaffective disorder.  Hx of bipolar a schizophrenia.  Patient known from 1 month ago.  Reported UBW of 225 lbs at the beginning of January with weight loss secondary to increased ETOH use.  Patient's weight appears stable from last admit.  Patient reports good appetite and intake currently with poor intake prior to admit secondary to ETOH use.    43 y.o. male  Height: Ht Readings from Last 1 Encounters:  03/17/14 6\' 3"  (1.905 m)    Weight: Wt Readings from Last 1 Encounters:  03/17/14 194 lb (87.998 kg)    Weight Hx: Wt Readings from Last 10 Encounters:  03/17/14 194 lb (87.998 kg)  02/12/14 192 lb (87.091 kg)    BMI:  Body mass index is 24.25 kg/(m^2). Pt meets criteria for normal weight based on current BMI.  Estimated Nutritional Needs: Kcal: 25-30 kcal/kg Protein: > 1 gram protein/kg Fluid: 1 ml/kcal  Diet Order: General Pt is also offered choice of unit snacks mid-morning and mid-afternoon.  Pt is eating as desired.   Lab results and medications reviewed.   02/14/14, RD, LDN Clinical Inpatient Dietitian Pager:  [phone] Weekend and after hours pager:  [phone]

## 2014-03-20 LAB — LITHIUM LEVEL: Lithium Lvl: <0.25 mEq/L — ABNORMAL LOW (ref 0.80–1.40)

## 2014-03-20 NOTE — Progress Notes (Signed)
D:  Per pt self inventory pt reports sleeping well, appetite good, energy level low, ability to pay attention improving, rates depression at a 5 out of 10 and hopelessness at a 5 out of 10, pt flat/depressed during interaction, denies HI/AVH, endorses Passive SI on and off, contracts for safety, c/o some withdrawal s/s including sedation and agitation, no other complaints at this time.     A:  Emotional support provided, Encouraged pt to continue with treatment plan and attend all group activities, q15 min checks maintained for safety.  R:  Pt is not going to groups, isolates in room, encouraged pt to come out of room and interact in the milieu, pt is pleasant and cooperative with staff and other patients.

## 2014-03-20 NOTE — Progress Notes (Signed)
Recreation Therapy Notes  Animal-Assisted Activity/Therapy (AAA/T) Program Checklist/Progress Notes Patient Eligibility Criteria Checklist & Daily Group note for Rec Tx Intervention  Date: 03.24.2015 Time: 2:45pm Location: [address] Dayroom    AAA/T Program Assumption of Risk Form signed by Patient/ or Parent Legal Guardian yes  Patient is free of allergies or sever asthma yes  Patient reports no fear of animals yes  Patient reports no history of cruelty to animals yes   Patient understands his/her participation is voluntary yes  Patient washes hands before animal contact yes  Patient washes hands after animal contact yes  Behavioral Response: Engaged, Appropriate   Education: Hand Washing, Appropriate Animal Interaction   Education Outcome: Acknowledges understanding  Clinical Observations/Feedback: Patient actively engaged in session, petting therapy dog appropriately and observing peer interaction with therapy dog.   Yannick Steuber L Solomia Harrell, LRT/CTRS  Ioana Louks L 03/20/2014 4:47 PM 

## 2014-03-20 NOTE — [facility] Group Notes (Signed)
[facility] LCSW Group Therapy  03/20/2014 3:18 PM  Type of Therapy:  Group Therapy  Participation Level:  Did Not Attend-pt resting in room/did not attend afternoon therapy group.   Smart, Zafar Debrosse LCSWA  03/20/2014, 3:18 PM

## 2014-03-20 NOTE — [facility] Suicide Risk Assessment (Signed)
[facility] INPATIENT:  Family/Significant Other Suicide Prevention Education  Suicide Prevention Education:  Education Completed; [place] (pt's friend) [phone] has been identified by the patient as the family member/significant other with whom the patient will be residing, and identified as the person(s) who will aid the patient in the event of a mental health crisis (suicidal ideations/suicide attempt).  With written consent from the patient, the family member/significant other has been provided the following suicide prevention education, prior to the and/or following the discharge of the patient.  The suicide prevention education provided includes the following:  Suicide risk factors  Suicide prevention and interventions  National Suicide Hotline telephone number  [facility] assessment telephone number  [facility] Emergency Assistance 911  [facility] and/or Residential Mobile Crisis Unit telephone number  Request made of family/significant other to:  Remove weapons (e.g., guns, rifles, knives), all items previously/currently identified as safety concern.    Remove drugs/medications (over-the-counter, prescriptions, illicit drugs), all items previously/currently identified as a safety concern.  The family member/significant other verbalizes understanding of the suicide prevention education information provided.  The family member/significant other agrees to remove the items of safety concern listed above.  Smart, Lochlyn Zullo LCSWA  03/20/2014, 11:03 AM

## 2014-03-20 NOTE — Progress Notes (Signed)
Pt attended spiritual care group on grief and loss facilitated by chaplain Burnis Kingfisher.  Group opened with brief discussion and psycho-social ed around grief and loss in relationships and in relation to self - identifying life patterns, circumstances, changes that cause losses. Established group norm of speaking from own life experience. Group goal of establishing open and affirming space for members to share loss and experience with grief, normalize grief experience and provide psycho social education and grief support.  Curtis Cobb was present in group, though left group setting several times.  This chaplain familiar with Curtis Cobb from previous admission.  After group, spoke with chaplain about loss of mother in 2012.  Curtis Cobb was very close with mother and Curtis Cobb and Curtis Cobb processed grief around loss of mother as loss of self, loss of context in the world.  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  Chaplain and Curtis Cobb spoke about ways to access and draw on one's own "woundedness" to develop skills to be present with others.     Curtis Cobb MDiv

## 2014-03-20 NOTE — Progress Notes (Signed)
Patient ID: Curtis Cobb, male   DOB: [birth year], 43 y.o.   MRN: [id number] Patient ID: Curtis Cobb, male   DOB: 1971/08/09, 43 y.o.   MRN: [id number] [facility] MD Progress Note  03/20/2014 3:42 PM Devendra Feuerhelm  MRN:  [id number]  Subjective:  [name] says "I'm feeling a lot better today. I feel more alert and oriented also. I have been attending groups and staying through it. I slept well last night. My appetite is improving. I'm looking forward to going to [facility] soon".  [name] is alert, smiling and socializing with the other patients. He says the change in his medications and the addition of Elavil made a difference in how he is feeling today. He is taking his medications without any adverse effects reported.  Diagnosis:   DSM5: Schizophrenia Disorders:  NA Obsessive-Compulsive Disorders:  NA Trauma-Stressor Disorders:  NA Substance/Addictive Disorders:  Alcohol Related Disorder - Severe (303.90) Depressive Disorders:  Schizoaffective disorder Total Time spent with patient: 30 minutes  Axis I: Alcohol dependence, Schizoaffective disorder Axis II: Deferred Axis III:  Past Medical History  Diagnosis Date  . Bipolar 1 disorder   . Schizophrenia   . Hypertension   . Depression   . Anxiety    Axis IV: economic problems, housing problems, occupational problems and Alcoholism, chronic Axis V: 41-50 serious symptoms  ADL's:  Impaired  Sleep: Good  Appetite:  Fair  Suicidal Ideation:  Plan:  Denies Intent:  Denies Means:  Denies  Homicidal Ideation:  Plan:  Denies Intent:  Denies Means:  Denies AEB (as evidenced by):  Psychiatric Specialty Exam: Physical Exam  Psychiatric: His speech is normal and behavior is normal. Judgment and thought content normal. His mood appears anxious. Cognition and memory are normal. He exhibits a depressed mood.    Review of Systems  Constitutional: Positive for malaise/fatigue and diaphoresis.  HENT: Negative.   Eyes: Negative.    Respiratory: Negative.   Cardiovascular: Negative.   Gastrointestinal: Negative.   Genitourinary: Negative.   Musculoskeletal: Negative.   Skin: Negative.   Neurological: Positive for tremors and weakness.  Endo/Heme/Allergies: Negative.   Psychiatric/Behavioral: Positive for depression and substance abuse. Negative for suicidal ideas, hallucinations and memory loss. The patient is nervous/anxious and has insomnia.     Blood pressure 100/67, pulse 94, temperature 97.9 F (36.6 C), temperature source Oral, resp. rate 20, height 6\' 3"  (1.905 m), weight 87.998 kg (194 lb), SpO2 98.00%.Body mass index is 24.25 kg/(m^2).  General Appearance: Disheveled  Eye Solicitor::  Fair  Speech:  Clear and Coherent  Volume:  Normal  Mood:  Anxious, Depressed and Hopeless  Affect:  Congruent  Thought Process:  Coherent and Intact  Orientation:  Full (Time, Place, and Person)  Thought Content:  Rumination  Suicidal Thoughts:  No  Homicidal Thoughts:  No  Memory:  Immediate;   Good Recent;   Good Remote;   Good  Judgement:  Fair  Insight:  Present  Psychomotor Activity:  Normal  Concentration:  Good  Recall:  Good  Fund of Knowledge:Fair  Language: Good  Akathisia:  No  Handed:  Right  AIMS (if indicated):     Assets:  Desire for Improvement  Sleep:  Number of Hours: 6.75   Musculoskeletal: Strength & Muscle Tone: within normal limits Gait & Station: normal Patient leans: N/A  Current Medications: Current Facility-Administered Medications  Medication Dose Route Frequency Provider Last Rate Last Dose  . acetaminophen (TYLENOL) tablet 650 mg  650 mg Oral Q4H PRN Nadeem  Gilmore Laroche, MD   650 mg at 03/17/14 1129  . alum & mag hydroxide-simeth (MAALOX/MYLANTA) 200-200-20 MG/5ML suspension 30 mL  30 mL Oral PRN Thresa Ross, MD      . amitriptyline (ELAVIL) tablet 25 mg  25 mg Oral QHS Sanjuana Kava, NP   25 mg at 03/19/14 2117  . carbamazepine (TEGRETOL XR) 12 hr tablet 200 mg  200 mg Oral BID  Sanjuana Kava, NP   200 mg at 03/20/14 0757  . feeding supplement (ENSURE COMPLETE) (ENSURE COMPLETE) liquid 237 mL  237 mL Oral BID BM Jeoffrey Massed, RD   237 mL at 03/20/14 1417  . hydrOXYzine (ATARAX/VISTARIL) tablet 25 mg  25 mg Oral Q4H PRN Sanjuana Kava, NP      . ibuprofen (ADVIL,MOTRIN) tablet 600 mg  600 mg Oral Q8H PRN Thresa Ross, MD      . lithium carbonate (LITHOBID) CR tablet 300 mg  300 mg Oral Q12H Larena Sox, MD   300 mg at 03/20/14 0757  . magnesium hydroxide (MILK OF MAGNESIA) suspension 30 mL  30 mL Oral Daily PRN Nanine Means, NP      . magnesium hydroxide (MILK OF MAGNESIA) suspension 30 mL  30 mL Oral Daily PRN Thresa Ross, MD      . multivitamin with minerals tablet 1 tablet  1 tablet Oral Daily Thresa Ross, MD   1 tablet at 03/20/14 0757  . thiamine (VITAMIN B-1) tablet 100 mg  100 mg Oral Daily Thresa Ross, MD   100 mg at 03/20/14 0757  . traZODone (DESYREL) tablet 100 mg  100 mg Oral QHS PRN Larena Sox, MD   100 mg at 03/19/14 2118    Lab Results:  Results for orders placed during the hospital encounter of 03/17/14 (from the past 48 hour(s))  LITHIUM LEVEL     Status: Abnormal   Collection Time    03/20/14  6:15 AM      Result Value Ref Range   Lithium Lvl <0.25 (*) 0.80 - 1.40 mEq/L   Comment: REPEATED TO VERIFY     Performed at [facility]    Physical Findings: AIMS: Facial and Oral Movements Muscles of Facial Expression: None, normal Lips and Perioral Area: None, normal Jaw: None, normal Tongue: None, normal,Extremity Movements Upper (arms, wrists, hands, fingers): None, normal Lower (legs, knees, ankles, toes): None, normal, Trunk Movements Neck, shoulders, hips: None, normal, Overall Severity Severity of abnormal movements (highest score from questions above): None, normal Incapacitation due to abnormal movements: None, normal Patient's awareness of abnormal movements (rate only patient's report): No Awareness,  Dental Status Current problems with teeth and/or dentures?: No Does patient usually wear dentures?: No  CIWA:  CIWA-Ar Total: 5 COWS:     Treatment Plan Summary: Daily contact with patient to assess and evaluate symptoms and progress in treatment Medication management  Plan:  1. Continue crisis management and stabilization.  2. Medication management: Continu tegretol 200 mg bid for mood stabilization, ithium Carbonate 300 mg for mood stabilization, elavil 25 mg Q bedtime for depression/insomnia..  3. Encouraged patient to attend groups and participate in group counseling sessions and activities.  4. Discharge plan in progress.  5. Continue current treatment plan.  6. Address health issues: Vitals reviewed and stable.   Medical Decision Making Problem Points:  Review of last therapy session (1) and Review of psycho-social stressors (1) Data Points:  Review of medication regiment & side effects (2) Review of new  medications or change in dosage (2)  I certify that inpatient services furnished can reasonably be expected to improve the patient's condition.   Armandina Stammer I, PMHNP-BC 03/20/2014, 3:42 PM

## 2014-03-20 NOTE — Progress Notes (Signed)
Type of Therapy: Paint Therapy with [employer] Students Participation Level: Patient did not attend the group

## 2014-03-21 MED ORDER — TRAZODONE HCL 100 MG PO TABS: 100.0000 mg | ORAL_TABLET | Freq: Every evening | ORAL | Status: DC | PRN

## 2014-03-21 MED ORDER — AMITRIPTYLINE HCL 25 MG PO TABS: 25.0000 mg | ORAL_TABLET | Freq: Every day | ORAL | Status: DC

## 2014-03-21 MED ORDER — LITHIUM CARBONATE ER 300 MG PO TBCR: 300.0000 mg | EXTENDED_RELEASE_TABLET | Freq: Two times a day (BID) | ORAL | Status: DC

## 2014-03-21 MED ORDER — CARBAMAZEPINE ER 200 MG PO TB12: 200.0000 mg | ORAL_TABLET | Freq: Two times a day (BID) | ORAL | Status: DC

## 2014-03-21 MED ORDER — HYDROXYZINE HCL 25 MG PO TABS: 25.0000 mg | ORAL_TABLET | ORAL | Status: DC | PRN

## 2014-03-21 NOTE — [facility] Group Notes (Signed)
[facility] LCSW Aftercare Discharge Planning Group Note   03/21/2014 10:46 AM  Participation Quality:  Appropriate   Mood/Affect:  Appropriate  Depression Rating:  2  Anxiety Rating:  2  Thoughts of Suicide:  No Will you contract for safety?   NA  Current AVH:  No  Plan for Discharge/Comments:  CSW informed pt of [facility] admission for Thursday. Pt reported that he was happy with this news and plans to take bus to [facility] tomorrow AM. Pt reports no signs of withdrawal today.   Transportation Means: bus   Supports: friend, Liborio Nixon.   Smart, [employer]

## 2014-03-21 NOTE — Progress Notes (Signed)
Pt.'s belongings moved from locker 3 to locker 15 to accommodate larger inventory due to finding previously misplaced sneakers.

## 2014-03-21 NOTE — Progress Notes (Signed)
[facility] Adult Case Management Discharge Plan :  Will you be returning to the same living situation after discharge: No.[facility] screening for admission tomorrow AM.  At discharge, do you have transportation home?:Yes,  bus-CSW and pt reviewed instructions for bus travel to [facility]. He must be d/ced no later than 6:15AM. Do you have the ability to pay for your medications:Yes,  mental health  Release of information consent forms completed and submitted to Medical Records by CSW.  Patient to Follow up at: Follow-up Information   Follow up with [facility]. (Walk in between 8am-9am Monday through Friday for hospital followup/medication management. )    Contact information:   [address], [place] [address] Phone: [phone] Fax: [phone]      Follow up with [facility] On 03/22/2014. (Arrive by 8am for screening and admission. Please bring [facility] ID, medication supply, and clothing upon admission. )    Contact information:   [address] [place], [place] [address] Phone: [phone] Fax: [phone]      Patient denies SI/HI:   Yes,  during group/self report.    Safety Planning and Suicide Prevention discussed:  Yes,  SPE completed with pt's friend, Liborio Nixon. [employer] pamphlet provided to pt and he was encouraged to share information with support network, ask questions, and talk about any concerns relating to SPE.  Smart, Oneika Simonian LCSWA  03/21/2014, 11:29 AM

## 2014-03-21 NOTE — Progress Notes (Signed)
Patient ID: Curtis Cobb, male   DOB: June 24, 1971, 43 y.o.   MRN: [id number]  D: Pt was laying in bed during the assessment. When asked about his day pt stated, "I feel a lot better than I did when I came in here. Pt stated he no longer has feelings of SI, but "he's still going thru some depression." However, pt stated that the depression is improving.  Pt is requesting to go to [facility] "at the end of the week or Monday"  A:  Support and encouragement was offered. 15 min checks continued for safety.  R: Pt remains safe.

## 2014-03-21 NOTE — [facility] Suicide Risk Assessment (Signed)
   Demographic Factors:  Male, Adolescent or young adult, Caucasian, Low socioeconomic status and Unemployed  Total Time spent with patient: 30 minutes  Psychiatric Specialty Exam: Physical Exam  ROS  Blood pressure 130/86, pulse 77, temperature 97.5 F (36.4 C), temperature source Oral, resp. rate 18, height 6\' 3"  (1.905 m), weight 87.998 kg (194 lb), SpO2 98.00%.Body mass index is 24.25 kg/(m^2).  General Appearance: Casual  Eye Contact::  Good  Speech:  Clear and Coherent  Volume:  Normal  Mood:  Anxious  Affect:  Appropriate and Congruent  Thought Process:  Coherent and Goal Directed  Orientation:  NA  Thought Content:  WDL  Suicidal Thoughts:  No  Homicidal Thoughts:  No  Memory:  Immediate;   Good  Judgement:  Good  Insight:  Good  Psychomotor Activity:  Normal  Concentration:  Good  Recall:  Good  Fund of Knowledge:Good  Language: Good  Akathisia:  NA  Handed:  Right  AIMS (if indicated):     Assets:  Communication Skills Desire for Improvement Leisure Time Physical Health Resilience Social Support Talents/Skills  Sleep:  Number of Hours: 6.75    Musculoskeletal: Strength & Muscle Tone: within normal limits Gait & Station: normal Patient leans: N/A   Mental Status Per Nursing Assessment::   On Admission:  Self-harm thoughts  Current Mental Status by Physician: NA  Loss Factors: Financial problems/change in socioeconomic status  Historical Factors: Impulsivity  Risk Reduction Factors:   Sense of responsibility to family, Religious beliefs about death, Positive social support, Positive therapeutic relationship and Positive coping skills or problem solving skills  Continued Clinical Symptoms:  Bipolar Disorder:   Depressive phase Alcohol/Substance Abuse/Dependencies Unstable or Poor Therapeutic Relationship Previous Psychiatric Diagnoses and Treatments  Cognitive Features That Contribute To Risk:  Polarized thinking    Suicide Risk:   Minimal: No identifiable suicidal ideation.  Patients presenting with no risk factors but with morbid ruminations; may be classified as minimal risk based on the severity of the depressive symptoms  Discharge Diagnoses:   AXIS I:  Bipolar, Depressed, Substance Induced Mood Disorder and Alcohol dependence AXIS II:  Deferred AXIS III:   Past Medical History  Diagnosis Date  . Bipolar 1 disorder   . Schizophrenia   . Hypertension   . Depression   . Anxiety    AXIS IV:  other psychosocial or environmental problems, problems related to social environment and problems with primary support group AXIS V:  61-70 mild symptoms  Plan Of Care/Follow-up recommendations:  Activity:  As tolerated Diet:  Regular  Is patient on multiple antipsychotic therapies at discharge:  No   Has Patient had three or more failed trials of antipsychotic monotherapy by history:  No  Recommended Plan for Multiple Antipsychotic Therapies: NA    Curtis Cobb,Curtis R. 03/21/2014, 3:36 PM

## 2014-03-21 NOTE — [facility] Group Notes (Signed)
[facility] LCSW Group Therapy  03/21/2014 3:01 PM  Type of Therapy:  Group Therapy  Participation Level:  Active  Participation Quality:  Attentive  Affect:  Appropriate  Cognitive:  Alert and Oriented  Insight:  Engaged  Engagement in Therapy:  Engaged  Modes of Intervention:  Confrontation, Discussion, Education, Exploration, Problem-solving, Rapport Building, Socialization and Support  Summary of Progress/Problems: Emotion Regulation: This group focused on both positive and negative emotion identification and allowed group members to process ways to identify feelings, regulate negative emotions, and find healthy ways to manage internal/external emotions. Group members were asked to reflect on a time when their reaction to an emotion led to a negative outcome and explored how alternative responses using emotion regulation would have benefited them. Group members were also asked to discuss a time when emotion regulation was utilized when a negative emotion was experienced. Callaway was attentive and engaged throughout today's therapy group. He shared that sadness and hopelessness are two emotions that he struggles with regulating. Akari shared that a lot of his sadness stems from his empathy toward others. "I take on other people's problems and that causes me even more sadness/hoplessness when I can't help them." Teandre shows progress in the group setting and improving insight AEB his ability to process how "self reflection through journaling, going to [facility], calling for help from my supports when I need it, and guided meditation and breathing techniques" can help him reduce negative emotions and work through these feelings to better understand these emotions.    Smart, Zelphia Glover LCSWA  03/21/2014, 3:01 PM

## 2014-03-21 NOTE — Progress Notes (Signed)
Patient ID: Curtis Cobb, male   DOB: Jan 18, 1971, 43 y.o.   MRN: [id number] Patient ID: Curtis Cobb, male   DOB: [birth year], 43 y.o.   MRN: [id number] Patient ID: Curtis Cobb, male   DOB: Nov 18, 1971, 43 y.o.   MRN: [id number] [facility] MD Progress Note  03/21/2014 5:52 PM Curtis Cobb  MRN:  [id number]  Subjective:  [name] says "He is much happier today knowing he is going to a long treatment center. He says he is starting to feel good about himself for the longest time. He denies any new issues or concerns.  [name] is alert, smiling and socializing with the other patients.  He is taking his medications without any adverse effects reported. He looks well, appears upbeat. He feels comfortable discussing his struggles with substance abuse, and has every reason to turn over a new leaf. He will be discharged in am.  Diagnosis:   DSM5: Schizophrenia Disorders:  NA Obsessive-Compulsive Disorders:  NA Trauma-Stressor Disorders:  NA Substance/Addictive Disorders:  Alcohol Related Disorder - Severe (303.90) Depressive Disorders:  Schizoaffective disorder Total Time spent with patient: 30 minutes  Axis I: Alcohol dependence, Schizoaffective disorder Axis II: Deferred Axis III:  Past Medical History  Diagnosis Date  . Bipolar 1 disorder   . Schizophrenia   . Hypertension   . Depression   . Anxiety    Axis IV: economic problems, housing problems, occupational problems and Alcoholism, chronic Axis V: 41-50 serious symptoms  ADL's:  Impaired  Sleep: Good  Appetite:  Fair  Suicidal Ideation:  Plan:  Denies Intent:  Denies Means:  Denies  Homicidal Ideation:  Plan:  Denies Intent:  Denies Means:  Denies AEB (as evidenced by):  Psychiatric Specialty Exam: Physical Exam  Psychiatric: His speech is normal and behavior is normal. Judgment and thought content normal. His mood appears anxious. Cognition and memory are normal. He exhibits a depressed mood.    Review of Systems  Constitutional:  Positive for malaise/fatigue and diaphoresis.  HENT: Negative.   Eyes: Negative.   Respiratory: Negative.   Cardiovascular: Negative.   Gastrointestinal: Negative.   Genitourinary: Negative.   Musculoskeletal: Negative.   Skin: Negative.   Neurological: Positive for tremors and weakness.  Endo/Heme/Allergies: Negative.   Psychiatric/Behavioral: Positive for depression and substance abuse. Negative for suicidal ideas, hallucinations and memory loss. The patient is nervous/anxious and has insomnia.     Blood pressure 130/86, pulse 77, temperature 97.5 F (36.4 C), temperature source Oral, resp. rate 18, height 6\' 3"  (1.905 m), weight 87.998 kg (194 lb), SpO2 98.00%.Body mass index is 24.25 kg/(m^2).  General Appearance: Disheveled  Eye ::  Fair  Speech:  Clear and Coherent  Volume:  Normal  Mood:  Anxious, Depressed and Hopeless  Affect:  Congruent  Thought Process:  Coherent and Intact  Orientation:  Full (Time, Place, and Person)  Thought Content:  Rumination  Suicidal Thoughts:  No  Homicidal Thoughts:  No  Memory:  Immediate;   Good Recent;   Good Remote;   Good  Judgement:  Fair  Insight:  Present  Psychomotor Activity:  Normal  Concentration:  Good  Recall:  Good  Fund of Knowledge:Fair  Language: Good  Akathisia:  No  Handed:  Right  AIMS (if indicated):     Assets:  Desire for Improvement  Sleep:  Number of Hours: 6.75   Musculoskeletal: Strength & Muscle Tone: within normal limits Gait & Station: normal Patient leans: N/A  Current Medications: Current Facility-Administered Medications  Medication Dose Route  Frequency Provider Last Rate Last Dose  . acetaminophen (TYLENOL) tablet 650 mg  650 mg Oral Q4H PRN Thresa Ross, MD   650 mg at 03/17/14 1129  . alum & mag hydroxide-simeth (MAALOX/MYLANTA) 200-200-20 MG/5ML suspension 30 mL  30 mL Oral PRN Thresa Ross, MD      . amitriptyline (ELAVIL) tablet 25 mg  25 mg Oral QHS Sanjuana Kava, NP   25 mg at  03/20/14 2113  . carbamazepine (TEGRETOL XR) 12 hr tablet 200 mg  200 mg Oral BID Sanjuana Kava, NP   200 mg at 03/21/14 1718  . feeding supplement (ENSURE COMPLETE) (ENSURE COMPLETE) liquid 237 mL  237 mL Oral BID BM Jeoffrey Massed, RD   237 mL at 03/21/14 0815  . hydrOXYzine (ATARAX/VISTARIL) tablet 25 mg  25 mg Oral Q4H PRN Sanjuana Kava, NP      . ibuprofen (ADVIL,MOTRIN) tablet 600 mg  600 mg Oral Q8H PRN Thresa Ross, MD      . lithium carbonate (LITHOBID) CR tablet 300 mg  300 mg Oral Q12H Larena Sox, MD   300 mg at 03/21/14 0814  . magnesium hydroxide (MILK OF MAGNESIA) suspension 30 mL  30 mL Oral Daily PRN Nanine Means, NP      . magnesium hydroxide (MILK OF MAGNESIA) suspension 30 mL  30 mL Oral Daily PRN Thresa Ross, MD      . multivitamin with minerals tablet 1 tablet  1 tablet Oral Daily Thresa Ross, MD   1 tablet at 03/21/14 [phone]  . thiamine (VITAMIN B-1) tablet 100 mg  100 mg Oral Daily Thresa Ross, MD   100 mg at 03/21/14 0814  . traZODone (DESYREL) tablet 100 mg  100 mg Oral QHS PRN Larena Sox, MD   100 mg at 03/20/14 2113    Lab Results:  Results for orders placed during the hospital encounter of 03/17/14 (from the past 48 hour(s))  LITHIUM LEVEL     Status: Abnormal   Collection Time    03/20/14  6:15 AM      Result Value Ref Range   Lithium Lvl <0.25 (*) 0.80 - 1.40 mEq/L   Comment: REPEATED TO VERIFY     Performed at [facility]    Physical Findings: AIMS: Facial and Oral Movements Muscles of Facial Expression: None, normal Lips and Perioral Area: None, normal Jaw: None, normal Tongue: None, normal,Extremity Movements Upper (arms, wrists, hands, fingers): None, normal Lower (legs, knees, ankles, toes): None, normal, Trunk Movements Neck, shoulders, hips: None, normal, Overall Severity Severity of abnormal movements (highest score from questions above): None, normal Incapacitation due to abnormal movements: None,  normal Patient's awareness of abnormal movements (rate only patient's report): No Awareness, Dental Status Current problems with teeth and/or dentures?: No Does patient usually wear dentures?: No  CIWA:  CIWA-Ar Total: 0 COWS:     Treatment Plan Summary: Daily contact with patient to assess and evaluate symptoms and progress in treatment Medication management  Plan:  1. Continue crisis management and stabilization.  2. Medication management: Continu tegretol 200 mg bid for mood stabilization, ithium Carbonate 300 mg for mood stabilization, elavil 25 mg Q bedtime for depression/insomnia..  3. Encouraged patient to attend groups and participate in group counseling sessions and activities.  4. Discharge plan in progress.  5. Continue current treatment plan.  6. Address health issues: Vitals reviewed and stable.   Medical Decision Making Problem Points:  Review of last therapy session (1)  and Review of psycho-social stressors (1) Data Points:  Review of medication regiment & side effects (2) Review of new medications or change in dosage (2)  I certify that inpatient services furnished can reasonably be expected to improve the patient's condition.   Sanjuana Kava, PMHNP-BC 03/21/2014, 5:52 PM     Patient seen, evaluated and I agree with notes by Nurse Practitioner. Thedore Mins, MD

## 2014-03-21 NOTE — Progress Notes (Signed)
Patient ID: Curtis Cobb, male   DOB: 1971-10-14, 43 y.o.   MRN: [id number] D: Patient in bed on approach. Pt stated he is doing well. Pt denies withdrawal symptoms. Pt denies SI/HI/AVH and pain. Pt attended evening NA group and engaged in discussion. Pt reports looking forward to discharge tomorrow and follow up with [facility]. Cooperative with assessment. No acute distressed noted at this time.   A: Met with pt 1:1. Medications administered as prescribed. Writer encouraged pt to discuss feelings. Pt encouraged to come to staff with any questions or concerns.   R: Patient is safe on the unit. He   is complaint with medications and denies any adverse reaction. Continue current POC.

## 2014-03-22 DIAGNOSIS — F209 Schizophrenia, unspecified: Secondary | ICD-10-CM

## 2014-03-22 LAB — CARBAMAZEPINE LEVEL, TOTAL: Carbamazepine Lvl: 7 ug/mL (ref 4.0–12.0)

## 2014-03-22 NOTE — Progress Notes (Signed)
Patient ID: Curtis Cobb, male   DOB: 29-Nov-1971, 43 y.o.   MRN: [id number] D: Patient appears calm and cooperative, denies SI/HI  at this time.   A: All personal items in locker returned to patient.  Patient  provided with discharge instructions and verbalized understanding.  R: Patient states he will comply with OP services and medications as prescribed. Patient escorted to lobby. Pt transported via [facility] to [facility].

## 2014-03-22 NOTE — Discharge Summary (Signed)
Physician Discharge Summary Note  Patient:  Curtis Cobb is an 43 y.o., male MRN:  [id number] DOB:  1971-12-20 Patient phone:  [phone] (home)  Patient address:   [place] [address],  Total Time spent with patient: 30 minutes  Date of Admission:  03/17/2014 Date of Discharge: 03/22/14  Reason for Admission:  Alcohol detox  Discharge Diagnoses: Active Problems:   Alcohol dependence   Psychiatric Specialty Exam: Physical Exam  Constitutional: He is oriented to person, place, and time. He appears well-developed.  HENT:  Head: Normocephalic.  Eyes: Pupils are equal, round, and reactive to light.  Neck: Normal range of motion.  Cardiovascular: Normal rate.   Respiratory: Effort normal.  GI: Soft.  Genitourinary:  Denies any issues in this areas  Musculoskeletal: Normal range of motion.  Neurological: He is alert and oriented to person, place, and time.  Skin: Skin is warm and dry.    Review of Systems  Constitutional: Negative.   HENT: Negative.   Eyes: Negative.   Respiratory: Negative.   Cardiovascular: Negative.   Gastrointestinal: Negative.   Genitourinary: Negative.   Musculoskeletal: Negative.   Skin: Negative.   Neurological: Negative.   Endo/Heme/Allergies: Negative.   Psychiatric/Behavioral: Positive for depression (Stable) and substance abuse (Alcoholism). Negative for suicidal ideas, hallucinations and memory loss. The patient has insomnia (Stable). The patient is not nervous/anxious.     Blood pressure 116/72, pulse 101, temperature 98.2 F (36.8 C), temperature source Oral, resp. rate 18, height 6\' 3"  (1.905 m), weight 87.998 kg (194 lb), SpO2 98.00%.Body mass index is 24.25 kg/(m^2).  General Appearance: Casual and Fairly Groomed  ::  Good  Speech:  Clear and Coherent  Volume:  Normal  Mood:  Stable  Affect:  Appropriate and Congruent  Thought Process:  Coherent and Goal Directed  Orientation:  Full (Time, Place, and Person)   Thought Content:  Denies any psychotic symptoms  Suicidal Thoughts:  No  Homicidal Thoughts:  No  Memory:  Immediate;   Good Recent;   Good Remote;   Good  Judgement:  Good  Insight:  Present  Psychomotor Activity:  Normal  Concentration:  Good  Recall:  Good  Fund of Knowledge:Fair  Language: Good  Akathisia:  No  Handed:  Right  AIMS (if indicated):     Assets:  Desire for Improvement  Sleep:  Number of Hours: 6.75    Past Psychiatric History: Diagnosis: Alcohol dependence, Schizoaffective disorder  Hospitalizations: [facility] adult unit  Outpatient Care: [facility]  Substance Abuse Care: [facility]  Self-Mutilation: NA  Suicidal Attempts: NA  Violent Behaviors: Denies   Musculoskeletal: Strength & Muscle Tone: within normal limits Gait & Station: normal Patient leans: N/A  DSM5: Schizophrenia Disorders:  NA Obsessive-Compulsive Disorders:  NA Trauma-Stressor Disorders:  NA Substance/Addictive Disorders:  Alcohol Related Disorder - Moderate (303.90) Depressive Disorders:  Schizoaffective disorder  Axis Diagnosis:   AXIS I:  Alcohol dependence, Schizoaffective disorder AXIS II:  Deferred AXIS III:   Past Medical History  Diagnosis Date  . Bipolar 1 disorder   . Schizophrenia   . Hypertension   . Depression   . Anxiety    AXIS IV:  other psychosocial or environmental problems and Polysubstance dependence AXIS V:  63  Level of Care:  OP  Hospital Course:  Curtis Cobb is 43 years old. caucasian male, recently discharged from this hospital to follow-up at [facility] clinic. He reports, "I went to the hospital a couple of days ago for alcohol detox and help for  my depression. I have been drinking a lot of liquor, a fifth of Vodka daily x 10 days. I have been an alcoholic for 20 years. My longest sobriety was 31/2 years. I relapsed 1 year ago after my mother passed. I recently broke up with my fiance. That put me into a deep end. I was not sleeping at night. I was at a  time up for 6 days. My mind is racing. I have bad tremors. I have been an opiate addict off and on, have not used recently. I was at the [facility] in [place] in 2011. This time, I would like a long term Cobb after discharge".  Curtis Cobb was admitted to the hospital for alcohol detoxification. His blood alcohol level on admission was 438 per toxicology reports. He was ordered and received Librium detoxification protocols. Curtis Cobb also suffers from other mental health issues. He required mood stabilization as well. He was ordered and received Tegretol 200 mg bid for mood stabilization, lithium Carbonate 300 mg bid for mood stabilization, Amitriptyline 25 mg Q bedtime for insomnia/depression, Hydroxyzine 25 mg Q 4 hours prn for anxiety disorder and Trazodone 100 mg Q bedtime for sleep. He was also enrolled and participated in group sessions, AA/NA meetings being offered on this unit. He learned coping skills.  Curtis Cobb and his mood stabilized. This is evidenced by his reports of improved mood and absence of withdrawal symptoms. He is currently being discharged to continue substance abuse Cobb at the [facility] in [place], [place]. And for medication management and routine psychiatric care, Curtis Cobb will receive these services at the [facility] in [place], [place].  Upon discharge, Curtis Cobb adamantly denies any SIHI, AVH, delusions, Paranoia and or withdrawal symptoms. He received 2 weeks worth supply samples of his [facility] discharge medications. He left [facility] with personal belongings in no distress. Transportation per city bus. Bus pass provided for patient.  Consults:  psychiatry  Significant Diagnostic Studies:  labs: CBC with diff, CMP, UDS, toxicology tests, U/A  Discharge Vitals:   Blood pressure 116/72, pulse 101, temperature 98.2 F (36.8 C), temperature source Oral, resp. rate 18, height 6\' 3"  (1.905 m), weight 87.998 kg (194 lb), SpO2 98.00%. Body mass index  is 24.25 kg/(m^2). Lab Results:   Results for orders placed during the hospital encounter of 03/17/14 (from the past 72 hour(s))  LITHIUM LEVEL     Status: Abnormal   Collection Time    03/20/14  6:15 AM      Result Value Ref Range   Lithium Lvl <0.25 (*) 0.80 - 1.40 mEq/L   Comment: REPEATED TO VERIFY     Performed at [facility]  CARBAMAZEPINE LEVEL, TOTAL     Status: None   Collection Time    03/22/14  6:50 AM      Result Value Ref Range   Carbamazepine Lvl 7.0  4.0 - 12.0 ug/mL   Comment: Performed at [facility]    Physical Findings: AIMS: Facial and Oral Movements Muscles of Facial Expression: None, normal Lips and Perioral Area: None, normal Jaw: None, normal Tongue: None, normal,Extremity Movements Upper (arms, wrists, hands, fingers): None, normal Lower (legs, knees, ankles, toes): None, normal, Trunk Movements Neck, shoulders, hips: None, normal, Overall Severity Severity of abnormal movements (highest score from questions above): None, normal Incapacitation due to abnormal movements: None, normal Patient's awareness of abnormal movements (rate only patient's report): No Awareness, Dental Status Current problems with teeth and/or dentures?: No Does  patient usually wear dentures?: No  CIWA:  CIWA-Ar Total: 0 COWS:     Psychiatric Specialty Exam: See Psychiatric Specialty Exam and Suicide Risk Assessment completed by Attending Physician prior to discharge.  Discharge destination:  [facility]  Is patient on multiple antipsychotic therapies at discharge:  No   Has Patient had three or more failed trials of antipsychotic monotherapy by history:  No  Recommended Plan for Multiple Antipsychotic Therapies: NA     Medication List    STOP taking these medications       benztropine 1 MG tablet  Commonly known as:  COGENTIN     haloperidol 5 MG tablet  Commonly known as:  HALDOL     lamoTRIgine 25 MG tablet  Commonly known  as:  LAMICTAL      TAKE these medications     Indication   amitriptyline 25 MG tablet  Commonly known as:  ELAVIL  Take 1 tablet (25 mg total) by mouth at bedtime. For sleep/depression   Indication:  Depression, Insomnia     carbamazepine 200 MG 12 hr tablet  Commonly known as:  TEGRETOL XR  Take 1 tablet (200 mg total) by mouth 2 (two) times daily. For mood stabilization   Indication:  Mood stabilization     hydrOXYzine 25 MG tablet  Commonly known as:  ATARAX/VISTARIL  Take 1 tablet (25 mg total) by mouth every 4 (four) hours as needed for anxiety (Sleep).   Indication:  Anxiety associated with Organic Disease, Tension     lithium carbonate 300 MG CR tablet  Commonly known as:  LITHOBID  Take 1 tablet (300 mg total) by mouth every 12 (twelve) hours. For mood stabilization   Indication:  Mood stabilization     traZODone 100 MG tablet  Commonly known as:  DESYREL  Take 1 tablet (100 mg total) by mouth at bedtime as needed for sleep.   Indication:  Trouble Sleeping           Follow-up Information   Follow up with [facility]. (Walk in between 8am-9am Monday through Friday for hospital followup/medication management. )    Contact information:   [address], [place] [address] Phone: [phone] Fax: [phone]      Follow up with [facility] On 03/22/2014. (Arrive by 8am for screening and admission. Please bring [facility] ID, medication supply, and clothing upon admission. )    Contact information:   [address] [place], [place] [address] Phone: [phone] Fax: [phone]     Follow-up recommendations: Activity:  As tolerated Diet: As recommended by your primary care doctor. Keep all scheduled follow-up appointments as recommended.   Comments:  Take all your medications as prescribed by your mental healthcare provider. Report any adverse effects and or reactions from your medicines to your outpatient provider promptly. Patient is  instructed and cautioned to not engage in alcohol and or illegal drug use while on prescription medicines. In the event of worsening symptoms, patient is instructed to call the crisis hotline, 911 and or go to the nearest ED for appropriate evaluation and Cobb of symptoms. Follow-up with your primary care provider for your other medical issues, concerns and or health care needs.   Total Discharge Time:  Greater than 30 minutes.  SignedSanjuana Kava, PMHNP-BC 03/22/2014, 6:05 PM  The patient was in face-to-face in for evaluation, suicide risk assessment, case discussed and it physician extender and made disposition plan. Reviewed the information documented and agree with the  Cobb plan.  Tammela Bales,JANARDHAHA R. 03/22/2014 6:36 PM

## 2014-03-27 NOTE — Progress Notes (Signed)
Patient Discharge Instructions:  After Visit Summary (AVS):   Faxed to:  03/27/14 Discharge Summary Note:   Faxed to:  03/27/14 Psychiatric Admission Assessment Note:   Faxed to:  03/27/14 Suicide Risk Assessment - Discharge Assessment:   Faxed to:  03/27/14 Faxed/Sent to the Next Level Care provider:  03/27/14 Faxed to [facility] @ [phone] Faxed to [facility] @ [phone]  Jerelene Redden, 03/27/2014, 1:47 PM

## 2015-06-25 ENCOUNTER — Emergency Department (HOSPITAL_COMMUNITY)
Admission: EM | Admit: 2015-06-25 | Discharge: 2015-06-26 | Disposition: A | Discharge status: Home / Self Care | Payer: Self-pay

## 2015-06-25 ENCOUNTER — Encounter (HOSPITAL_COMMUNITY): Payer: Self-pay

## 2015-06-25 DIAGNOSIS — F1510 Other stimulant abuse, uncomplicated: Secondary | ICD-10-CM | POA: Insufficient documentation

## 2015-06-25 DIAGNOSIS — F419 Anxiety disorder, unspecified: Secondary | ICD-10-CM | POA: Insufficient documentation

## 2015-06-25 DIAGNOSIS — M069 Rheumatoid arthritis, unspecified: Secondary | ICD-10-CM | POA: Insufficient documentation

## 2015-06-25 DIAGNOSIS — F259 Schizoaffective disorder, unspecified: Secondary | ICD-10-CM

## 2015-06-25 DIAGNOSIS — Z79899 Other long term (current) drug therapy: Secondary | ICD-10-CM | POA: Insufficient documentation

## 2015-06-25 DIAGNOSIS — Z7952 Long term (current) use of systemic steroids: Secondary | ICD-10-CM | POA: Insufficient documentation

## 2015-06-25 DIAGNOSIS — F319 Bipolar disorder, unspecified: Secondary | ICD-10-CM | POA: Insufficient documentation

## 2015-06-25 DIAGNOSIS — F22 Delusional disorders: Secondary | ICD-10-CM | POA: Insufficient documentation

## 2015-06-25 DIAGNOSIS — I10 Essential (primary) hypertension: Secondary | ICD-10-CM | POA: Insufficient documentation

## 2015-06-25 DIAGNOSIS — F209 Schizophrenia, unspecified: Secondary | ICD-10-CM | POA: Insufficient documentation

## 2015-06-25 DIAGNOSIS — Z87891 Personal history of nicotine dependence: Secondary | ICD-10-CM | POA: Insufficient documentation

## 2015-06-25 HISTORY — DX: Rheumatoid arthritis, unspecified: M06.9

## 2015-06-25 LAB — CBC WITH DIFFERENTIAL/PLATELET
BASOS PCT: 1 % (ref 0–1)
Basophils Absolute: 0.1 10*3/uL (ref 0.0–0.1)
Eosinophils Absolute: 0.1 10*3/uL (ref 0.0–0.7)
Eosinophils Relative: 1 % (ref 0–5)
HCT: 41.2 % (ref 39.0–52.0)
Hemoglobin: 14.1 g/dL (ref 13.0–17.0)
LYMPHS ABS: 2.9 10*3/uL (ref 0.7–4.0)
Lymphocytes Relative: 24 % (ref 12–46)
MCH: 30.1 pg (ref 26.0–34.0)
MCHC: 34.2 g/dL (ref 30.0–36.0)
MCV: 87.8 fL (ref 78.0–100.0)
Monocytes Absolute: 1.2 10*3/uL — ABNORMAL HIGH (ref 0.1–1.0)
Monocytes Relative: 9 % (ref 3–12)
Neutro Abs: 8.2 10*3/uL — ABNORMAL HIGH (ref 1.7–7.7)
Neutrophils Relative %: 65 % (ref 43–77)
PLATELETS: 336 10*3/uL (ref 150–400)
RBC: 4.69 MIL/uL (ref 4.22–5.81)
RDW: 13.2 % (ref 11.5–15.5)
WBC: 12.5 10*3/uL — ABNORMAL HIGH (ref 4.0–10.5)

## 2015-06-25 LAB — COMPREHENSIVE METABOLIC PANEL
ALBUMIN: 4.3 g/dL (ref 3.5–5.0)
ALK PHOS: 40 U/L (ref 38–126)
ALT: 37 U/L (ref 17–63)
ANION GAP: 11 (ref 5–15)
AST: 32 U/L (ref 15–41)
BILIRUBIN TOTAL: 0.5 mg/dL (ref 0.3–1.2)
BUN: 12 mg/dL (ref 6–20)
CO2: 24 mmol/L (ref 22–32)
Calcium: 8.9 mg/dL (ref 8.9–10.3)
Chloride: 102 mmol/L (ref 101–111)
Creatinine, Ser: 1.04 mg/dL (ref 0.61–1.24)
GFR calc non Af Amer: >60 mL/min (ref >60)
Glucose, Bld: 101 mg/dL — ABNORMAL HIGH (ref 65–99)
Potassium: 2.9 mmol/L — ABNORMAL LOW (ref 3.5–5.1)
Sodium: 137 mmol/L (ref 135–145)
TOTAL PROTEIN: 7.3 g/dL (ref 6.5–8.1)

## 2015-06-25 LAB — SALICYLATE LEVEL: Salicylate Lvl: <4 mg/dL (ref 2.8–30.0)

## 2015-06-25 LAB — RAPID URINE DRUG SCREEN, HOSP PERFORMED
Amphetamines: POSITIVE — AB
BARBITURATES: NOT DETECTED
BENZODIAZEPINES: NOT DETECTED
Cocaine: NOT DETECTED
Opiates: NOT DETECTED
Tetrahydrocannabinol: NOT DETECTED

## 2015-06-25 LAB — ACETAMINOPHEN LEVEL: Acetaminophen (Tylenol), Serum: <10 ug/mL — ABNORMAL LOW (ref 10–30)

## 2015-06-25 LAB — ETHANOL: Alcohol, Ethyl (B): <5 mg/dL (ref <5)

## 2015-06-25 MED ORDER — IBUPROFEN 200 MG PO TABS: 600.0000 mg | ORAL_TABLET | Freq: Three times a day (TID) | ORAL | Status: DC | PRN

## 2015-06-25 MED ORDER — LORAZEPAM 1 MG PO TABS
1.0000 mg | ORAL_TABLET | Freq: Three times a day (TID) | ORAL | Status: DC | PRN
  Administered 2015-06-25: 1 mg via ORAL
  Filled 2015-06-25: qty 1

## 2015-06-25 MED ORDER — HALOPERIDOL 5 MG PO TABS
5.0000 mg | ORAL_TABLET | Freq: Two times a day (BID) | ORAL | Status: DC
  Administered 2015-06-25 – 2015-06-26 (×2): 5 mg via ORAL
  Filled 2015-06-25 (×2): qty 1

## 2015-06-25 MED ORDER — LAMOTRIGINE 25 MG PO TABS
25.0000 mg | ORAL_TABLET | Freq: Every day | ORAL | Status: DC
Start: 2015-06-25 — End: 2015-06-26
  Administered 2015-06-25 – 2015-06-26 (×2): 25 mg via ORAL
  Filled 2015-06-25 (×2): qty 1

## 2015-06-25 MED ORDER — TRAZODONE HCL 50 MG PO TABS
50.0000 mg | ORAL_TABLET | Freq: Every day | ORAL | Status: DC
  Administered 2015-06-25: 50 mg via ORAL
  Filled 2015-06-25: qty 1

## 2015-06-25 MED ORDER — ONDANSETRON HCL 4 MG PO TABS: 4.0000 mg | ORAL_TABLET | Freq: Three times a day (TID) | ORAL | Status: DC | PRN

## 2015-06-25 MED ORDER — ALUM & MAG HYDROXIDE-SIMETH 200-200-20 MG/5ML PO SUSP: 30.0000 mL | ORAL | Status: DC | PRN

## 2015-06-25 MED ORDER — BENZTROPINE MESYLATE 1 MG PO TABS
0.5000 mg | ORAL_TABLET | Freq: Two times a day (BID) | ORAL | Status: DC
  Administered 2015-06-25 – 2015-06-26 (×2): 0.5 mg via ORAL
  Filled 2015-06-25 (×2): qty 1

## 2015-06-25 MED ORDER — ZIPRASIDONE HCL 20 MG PO CAPS
40.0000 mg | ORAL_CAPSULE | Freq: Two times a day (BID) | ORAL | Status: DC
  Administered 2015-06-25: 40 mg via ORAL
  Filled 2015-06-25: qty 2

## 2015-06-25 NOTE — BH Assessment (Signed)
Tele Assessment Note   Curtis Cobb is an 44 y.o. male presenting to ED with Auditory hallucination that are narrating what he is doing, and telling him they know he wants to hurt and rape his girl friend and that they are going to set him up. Pt reports this started a week or two ago and scares him as the voices seem to see everything he does. Pt reports a similar episode about a year and a half ago. Pt notes at the time of both episodes he was abusing adderall. Pt reports he is under a lot of stress. He was dx with RA in November, "I went to bed normal and woke up unable to move or function." Pt reports he recently moved, has a lot of stress related to work, lack of transportation, and working with his RA which has him very exhausted. At time of assessment pt was very anxious, but cooperative. He denies SI, HI, or self harm. Reports three past suicide attempts when he felt like he had made progress and was then unable to maintain it.   Pt reports a hx of depression that comes and goes but has been worse lately with onset of RA. Pt reports he is tired all the time, extremely depressed, worried about the future, loss of hope at times, decreased grooming, crying spells, loss of pleasure and loss of motivation. Pt described periods of manic episodes lasting up to weeks at at time with increased risk taking and goal directed behavior. Pt reports last episode was several weeks ago.   Pt reports hx of frequent worry and episodic bouts with panic attacks. Pt denies hx of abuse or neglect. He reports it was traumatic to lose his mother in 2012. He reports mild OCD type behaviors revolving symmetry.   Pt reports hx of abusing etoh, opioid, THC, and adderall. Pt reports he was dx with ADHD years ago and has the sx even when depression and anxiety are well managed. He reports when he takes medication as prescribed he does well but, has fallen in to misusing the medication. Pt reports he drank up to half gallon of  vodka per day but as been sober, minus one recent day, for 14 months. He reports no use of THC for 5 years, and no opioids for 1-2 years.  Pt reports family hx of bipolar and schizophrenia, etoh and drug abuse. No suicide hx reported.   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 Bipolar 1 disorder   . Schizophrenia   . Hypertension   . Depression   . Anxiety   . Rheumatoid arthritis     History reviewed. No pertinent past surgical history.  Family History: No family history on file.  Social History:  reports that he has quit smoking. He does not have any smokeless tobacco history on file. He reports that he does not drink alcohol or use illicit drugs.  Additional Social History:  Alcohol / Drug Use Pain Medications: Reports hx of abuse, reports no abuse in last 1-2 years Prescriptions: See PTA, reports taking more than prescribed adderall  in last couple of weeks  Over the Counter: See PTA History of alcohol / drug use?: Yes Longest period of sobriety (when/how long): 14 months for etoh, 2 years opioids, and 5-6 years for [facility]  Negative Consequences of Use: Personal relationships Withdrawal Symptoms:  (none reported at this time) Substance #1 Name of Substance 1: etoh  1 - Age of First Use[age] 1 - Amount (size/oz): 1/5 to 1/2 gallon of vodka  1 - Frequency: daily  1 - Duration: years 1 - Last Use / Amount: 14 months, with one day relapse recently  Substance #2 Name of Substance 2: Adderall 2 - Age of First Use: 2011 2 - Amount (size/oz): unknown 2 - Frequency: "days at a  time" 2 - Duration: episodes of abuse, reports medication works really well when he takes as prescribed 2 - Last Use / Amount: 06-24-15 Substance #3 Name of Substance 3: opioids 3 - Age of First Use: teens 3 - Amount (size/oz): 80-160 mg per day 3 - Frequency: daily  3 - Duration: on and off 3 - Last Use / Amount: 1-2 years ago  Substance #4 Name of Substance 4: THC  4 - Last Use / Amount: 5-6 years ago   CIWA: CIWA-Ar BP: 139/90 mmHg Pulse Rate: 103 COWS:    PATIENT STRENGTHS: (choose at least two) Average or above average intelligence Communication skills Supportive family/friends Work skills  Allergies:  Allergies  Allergen Reactions  . Sulfa Antibiotics Other (See Comments)    Unknown reaction    Home Medications:  (Not in a hospital admission)  OB/GYN Status:  No LMP for male patient.  General Assessment Data Location of Assessment: [facility] TTS Assessment: In system Is this a Tele or Face-to-Face Assessment?: Face-to-Face Is this an Initial Assessment or a Re-assessment for this encounter?: Initial Assessment Marital status: Long term relationship Is patient pregnant?: No Pregnancy Status: No Living Arrangements: Spouse/significant other Can pt return to current living arrangement?: Yes Admission Status: Voluntary Is patient capable of signing voluntary admission?: Yes Referral Source: Self/Family/Friend Insurance type: SP     Crisis Care Plan Living Arrangements: Spouse/significant other Name of Psychiatrist: Dr. Omelia Blackwater  Name of Therapist: formerly Dorcas Mcmurray, none currently   Education Status Is patient currently in school?: No Current Grade: NA Highest grade of school patient has completed: 10 Name of school: NA Contact person: NA  Risk to self with the past 6 months Suicidal Ideation: No Has patient been a risk to self within the past 6 months prior to admission? : No Suicidal Intent: No Has patient had any suicidal intent within the past 6  months prior to admission? : No Is patient at risk for suicide?: Yes Suicidal Plan?: No Has patient had any suicidal plan within the past 6 months prior to admission? : No Access to Means: No What has been your use of drugs/alcohol within the last 12 months?: Pt has hx of abusing etoh, THC, opiods, and amphetamines. Currently reports only abusing adderall  Previous Attempts/Gestures: Yes How many times?: 3 Other Self Harm Risks: none Triggers for Past Attempts: Other (Comment) (felt like making progress then something goes wrong) Intentional Self Injurious Behavior: None Family Suicide History: No Recent stressful life event(s): Financial Problems (RA dx, work/ financial stress, AH, recent move) Persecutory voices/beliefs?: Yes Depression: Yes Depression Symptoms: Despondent, Insomnia, Tearfulness, Isolating, Fatigue, Loss of interest in usual pleasures, Feeling worthless/self pity, Feeling angry/irritable Substance abuse history and/or treatment for substance abuse?: Yes Suicide prevention information given  to non-admitted patients: Not applicable  Risk to Others within the past 6 months Homicidal Ideation: No Does patient have any lifetime risk of violence toward others beyond the six months prior to admission? : No Thoughts of Harm to Others: No Current Homicidal Intent: No Current Homicidal Plan: No Access to Homicidal Means: No Identified Victim: none History of harm to others?: Yes Assessment of Violence: In distant past Violent Behavior Description: reports would get into fights when he was drinking  Does patient have access to weapons?: No Criminal Charges Pending?: No Does patient have a court date: No Is patient on probation?: No  Psychosis Hallucinations: Auditory Delusions: Persecutory  Mental Status Report Appearance/Hygiene: Disheveled, In scrubs Eye Contact: Good Motor Activity: Unremarkable Speech: Logical/coherent Level of Consciousness: Alert Mood:  Depressed, Anxious Affect: Appropriate to circumstance Anxiety Level: Panic Attacks Panic attack frequency: multiple per day  Most recent panic attack: 06-24-15 Thought Processes: Coherent, Relevant Judgement: Partial Orientation: Person, Place, Time, Situation Obsessive Compulsive Thoughts/Behaviors: Minimal  Cognitive Functioning Concentration: Decreased Memory: Recent Intact, Remote Intact IQ: Average Insight: Fair Impulse Control: Poor Appetite: Fair Weight Loss: 0 Weight Gain: 0 Sleep: Decreased Total Hours of Sleep:  (reports has not slept much in several weeks ) Vegetative Symptoms: Decreased grooming  ADLScreening [facility]) Patient's cognitive ability adequate to safely complete daily activities?: Yes Patient able to express need for assistance with ADLs?: Yes Independently performs ADLs?: Yes (appropriate for developmental age)  Prior Inpatient Therapy Prior Inpatient Therapy: Yes Prior Therapy Dates: 02/2014, and 01/2014 Prior Therapy Facilty/Provider(s): [facility] Reason for Treatment: SA, psychosis   Prior Outpatient Therapy Prior Outpatient Therapy: Yes Prior Therapy Dates: current  Prior Therapy Facilty/Provider(s): Dr. Omelia Blackwater medication management, attends NA (Previously saw counselor Dorcas Mcmurray) Reason for Treatment: SA, depression, ADHD- medication management  Does patient have an ACCT team?: No Does patient have Intensive In-House Services?  : No Does patient have [facility] services? : No Does patient have [facility] services?: No  ADL Screening (condition at time of admission) Patient's cognitive ability adequate to safely complete daily activities?: Yes Is the patient deaf or have difficulty hearing?: No Does the patient have difficulty seeing, even when wearing glasses/contacts?: No Does the patient have difficulty concentrating, remembering, or making decisions?: No Patient able to express need for assistance with ADLs?: Yes Does the patient  have difficulty dressing or bathing?: Yes (at times due to RA) Independently performs ADLs?: Yes (appropriate for developmental age) Does the patient have difficulty walking or climbing stairs?:  (at times due to RA) Weakness of Legs: Both Weakness of Arms/Hands: Both  Home Assistive Devices/Equipment Home Assistive Devices/Equipment: None    Abuse/Neglect Assessment (Assessment to be complete while patient is alone) Physical Abuse: Denies Verbal Abuse: Denies Sexual Abuse: Denies Exploitation of patient/patient's resources: Denies Self-Neglect: Denies Values / Beliefs Cultural Requests During Hospitalization: None Spiritual Requests During Hospitalization: None   Advance Directives (For Healthcare) Does patient have an advance directive?: No Would patient like information on creating an advanced directive?: No - patient declined information    Additional Information 1:1 In Past 12 Months?: No CIRT Risk: No Elopement Risk: No Does patient have medical clearance?: No     Disposition:  AM psych evaluation for final disposition per Donell Sievert, PA   Informed RN and pt.    Clista Bernhardt, [facility] Triage Specialist 06/25/2015 7:25 AM  Disposition Initial Assessment Completed for this Encounter: Yes Disposition of Patient: Other dispositions (AM psych evaluation per Donell Sievert, PA for final dispo)  Yonas Bunda M 06/25/2015 7:12 AM

## 2015-06-25 NOTE — ED Notes (Signed)
Pt presents with complaint of hearing voices x 2 days, states the voices are trying to control him.  Denies SI, HI.  Pt reports symptoms started after taking Adderall.  Pt diagnosed with Schizophrenia and Bipolar DO in past.  AAO x 3, no distress noted, monitoring for safety, Q 15 min checks in effect.

## 2015-06-25 NOTE — ED Provider Notes (Signed)
CSN: [id number]     Arrival date & time 06/25/15  5284 History   First MD Initiated Contact with Patient 06/25/15 0602     Chief Complaint  Patient presents with  . Delusional     (Consider location/radiation/quality/duration/timing/severity/associated sxs/prior Treatment) HPI  Level 5 Caveat: psychotic. This is a 44 year old male with a history of schizophrenia and bipolar disorder. He is brought in by his girlfriend who states that he had been clean from drugs for over a year but was prescribed Adderall about a month ago which he abused. This seemed to have triggered psychotic break. For the past 2 weeks or so he has had increasing agitation, delusions of persecution and auditory hallucinations. He is hearing voices upstairs that are accusing him of abusing and raping his girlfriend (who denies that the patient has been acting violently or threatening in any way). He believes forces beyond his control her attempting to lock him in the hospital or imprison him against his will. He has had difficulty sleeping although he was able to sleep yesterday evening because his girlfriend gave him a 75 milligram amitriptyline capsule. He is not currently on any psychiatric medications. He was diagnosed with rheumatoid arthritis about 6 months ago but denies joint pain presently. She denies recent alcohol or illicit drug use.  Past Medical History  Diagnosis Date  . Bipolar 1 disorder   . Schizophrenia   . Hypertension   . Depression   . Anxiety   . Rheumatoid arthritis    History reviewed. No pertinent past surgical history. No family history on file. History  Substance Use Topics  . Smoking status: Former Smoker -- 0.00 packs/day for 0 years  . Smokeless tobacco: Not on file  . Alcohol Use: No    Review of Systems  Unable to perform ROS   Allergies  Sulfa antibiotics  Home Medications   Prior to Admission medications   Medication Sig Start Date End Date Taking? Authorizing Provider   5-Hydroxytryptophan (5-HTP PO) Take 1 capsule by mouth daily.   Yes Historical Provider, MD  amitriptyline (ELAVIL) 25 MG tablet Take 1 tablet (25 mg total) by mouth at bedtime. For sleep/depression Patient taking differently: Take 75 mg by mouth at bedtime as needed for sleep. For sleep/depression 03/21/14  Yes Sanjuana Kava, NP  OVER THE COUNTER MEDICATION Take 1 capsule by mouth daily. Tianeptine   Yes Historical Provider, MD  OVER THE COUNTER MEDICATION Take 1 capsule by mouth daily. Phenibut   Yes Historical Provider, MD  predniSONE (DELTASONE) 20 MG tablet Take 20 mg by mouth daily with breakfast.   Yes Historical Provider, MD  carbamazepine (TEGRETOL XR) 200 MG 12 hr tablet Take 1 tablet (200 mg total) by mouth 2 (two) times daily. For mood stabilization Patient not taking: Reported on 06/25/2015 03/21/14   Sanjuana Kava, NP  hydrOXYzine (ATARAX/VISTARIL) 25 MG tablet Take 1 tablet (25 mg total) by mouth every 4 (four) hours as needed for anxiety (Sleep). Patient not taking: Reported on 06/25/2015 03/21/14   Sanjuana Kava, NP  lithium carbonate (LITHOBID) 300 MG CR tablet Take 1 tablet (300 mg total) by mouth every 12 (twelve) hours. For mood stabilization Patient not taking: Reported on 06/25/2015 03/21/14   Sanjuana Kava, NP  traZODone (DESYREL) 100 MG tablet Take 1 tablet (100 mg total) by mouth at bedtime as needed for sleep. Patient not taking: Reported on 06/25/2015 03/21/14   Sanjuana Kava, NP   BP 139/90 mmHg  Pulse  103  Temp(Src) 97.9 F (36.6 C) (Oral)  Resp 18  SpO2 100%   Physical Exam  General: Well-developed, well-nourished male in no acute distress; appearance consistent with age of record HENT: normocephalic; atraumatic Eyes: pupils equal, round and reactive to light; extraocular muscles intact Neck: supple Heart: regular rate and rhythm Lungs: clear to auscultation bilaterally Abdomen: soft; nondistended; nontender; bowel sounds present Extremities: No deformity; full  range of motion Neurologic: Awake, alert and oriented; motor function intact in all extremities and symmetric; no facial droop Skin: Warm and dry Psychiatric: Mildly agitated; anxious; paranoid; auditory hallucinations; no SI or HI    ED Course  Procedures (including critical care time)   MDM  Nursing notes and vitals signs, including pulse oximetry, reviewed.  Summary of this visit's results, reviewed by myself:  Labs:  Results for orders placed or performed during the hospital encounter of 06/25/15 (from the past 24 hour(s))  Acetaminophen level     Status: Abnormal   Collection Time: 06/25/15  6:00 AM  Result Value Ref Range   Acetaminophen (Tylenol), Serum <10 (L) 10 - 30 ug/mL  Comprehensive metabolic panel     Status: Abnormal   Collection Time: 06/25/15  6:00 AM  Result Value Ref Range   Sodium 137 135 - 145 mmol/L   Potassium 2.9 (L) 3.5 - 5.1 mmol/L   Chloride 102 101 - 111 mmol/L   CO2 24 22 - 32 mmol/L   Glucose, Bld 101 (H) 65 - 99 mg/dL   BUN 12 6 - 20 mg/dL   Creatinine, Ser 3.25 0.61 - 1.24 mg/dL   Calcium 8.9 8.9 - 49.8 mg/dL   Total Protein 7.3 6.5 - 8.1 g/dL   Albumin 4.3 3.5 - 5.0 g/dL   AST 32 15 - 41 U/L   ALT 37 17 - 63 U/L   Alkaline Phosphatase 40 38 - 126 U/L   Total Bilirubin 0.5 0.3 - 1.2 mg/dL   GFR calc non Af Amer >60 >60 mL/min   GFR calc Af Amer >60 >60 mL/min   Anion gap 11 5 - 15  Ethanol (ETOH)     Status: None   Collection Time: 06/25/15  6:00 AM  Result Value Ref Range   Alcohol, Ethyl (B) <5 <5 mg/dL  Salicylate level     Status: None   Collection Time: 06/25/15  6:00 AM  Result Value Ref Range   Salicylate Lvl <4.0 2.8 - 30.0 mg/dL  CBC with Differential     Status: Abnormal   Collection Time: 06/25/15  6:00 AM  Result Value Ref Range   WBC 12.5 (H) 4.0 - 10.5 K/uL   RBC 4.69 4.22 - 5.81 MIL/uL   Hemoglobin 14.1 13.0 - 17.0 g/dL   HCT 26.4 15.8 - 30.9 %   MCV 87.8 78.0 - 100.0 fL   MCH 30.1 26.0 - 34.0 pg   MCHC 34.2  30.0 - 36.0 g/dL   RDW 40.7 68.0 - 88.1 %   Platelets 336 150 - 400 K/uL   Neutrophils Relative % 65 43 - 77 %   Neutro Abs 8.2 (H) 1.7 - 7.7 K/uL   Lymphocytes Relative 24 12 - 46 %   Lymphs Abs 2.9 0.7 - 4.0 K/uL   Monocytes Relative 9 3 - 12 %   Monocytes Absolute 1.2 (H) 0.1 - 1.0 K/uL   Eosinophils Relative 1 0 - 5 %   Eosinophils Absolute 0.1 0.0 - 0.7 K/uL   Basophils Relative 1 0 -  1 %   Basophils Absolute 0.1 0.0 - 0.1 K/uL   6:49 AM Geodon 40 milligrams by mouth ordered for psychotic symptoms.    Paula Libra, MD 06/25/15 [phone]

## 2015-06-25 NOTE — ED Notes (Signed)
Pt asleep due to medications given. RN notified.

## 2015-06-25 NOTE — BH Assessment (Signed)
Reviewed ED notes prior to initiating assessment. Per notes, pt has hx of schizophrenia and bipolar, and recently started adderall which may have triggered a psychotic break.   Assessment to commence shortly.    Clista Bernhardt, [facility] Triage Specialist 06/25/2015 6:27 AM

## 2015-06-25 NOTE — Consult Note (Signed)
[facility] Consult   Reason for Consult:  Schizoaffective disorder, ADHD by hx, r/o Psychosis,  Referring Physician:  EDP Patient Identification: Curtis Cobb MRN:  [id number] Principal Diagnosis: Schizoaffective disorder Diagnosis:   Patient Active Problem List   Diagnosis Date Noted  . Schizoaffective disorder [F25.9] 02/11/2014    Priority: High  . Alcohol dependence [F10.20] 02/13/2014  . Cannabis abuse [F12.10] 02/13/2014  . Bipolar disorder [F31.9] 02/11/2014    Total Time spent with patient: 1 hour  Subjective:   Curtis ARO is a 44 y.o. male patient admitted with Schizoaffective disorder,  ADHD by hx, R/O Psychosis, .  HPI: Caucasian male, 44 years old was evaluated for auditory hallucination.  Patient has a documented hx of Schizoaffective disorder and was hospitalized at [facility] last year February.  Patient reports that he is hearing voices  Because My spirit is out of alignment"  Patient reported that the voices are telling him that he is not a good person and God is making him hear voices to correct his behavior.  Patient also stated that he is constantly feeling guilty because he does not treat people well.  Patient   Reports a diagnosis of ADHD and stated that he receives Adderall from his PMD.  Patient also reports a diagnosis of Rheumatoid Arthritis and takes Prednisone everyday.  Patient reports a family hx of Schizophrenia by his maternal grand father.  Patient denies any substance use including Alcohol for 15 months.  Patient has been accepted for admission and we will be looking for placement.  Patient denies SI/HI [facility] but is hearing voices.  Patient have asked to be discharged home.  We may secure IVC status if he is determined to be a danger to self and others.  HPI Elements:   Location:  Schizoaffective disorder, ADHD BY Hx, R/O Psychosis.. Quality:  severe. Severity:  severe. Timing:  Acute. Duration:  Chronic mental illness. Context:  Seeking  treatment for Auditory hallucination..  Past Medical History:  Past Medical History  Diagnosis Date  . Bipolar 1 disorder   . Schizophrenia   . Hypertension   . Depression   . Anxiety   . Rheumatoid arthritis    History reviewed. No pertinent past surgical history. Family History: No family history on file. Social History:  History  Alcohol Use No     History  Drug Use No    Comment: THC    History   Social History  . Marital Status: Single    Spouse Name: N/A  . Number of Children: N/A  . Years of Education: N/A   Social History Main Topics  . Smoking status: Former Smoker -- 0.00 packs/day for 0 years  . Smokeless tobacco: Not on file  . Alcohol Use: No  . Drug Use: No     Comment: THC  . Sexual Activity: Yes    Birth Control/ Protection: None   Other Topics Concern  . None   Social History Narrative   Additional Social History:    Pain Medications: Reports hx of abuse, reports no abuse in last 1-2 years Prescriptions: See PTA, reports taking more than prescribed adderall in last couple of weeks  Over the Counter: See PTA History of alcohol / drug use?: Yes Longest period of sobriety (when/how long): 14 months for etoh, 2 years opioids, and 5-6 years for [facility]  Negative Consequences of Use: Personal relationships Withdrawal Symptoms:  (none reported at this time) Name of Substance 1: etoh  1 -  Age of First [age] 1 - Amount (size/oz): 1/5 to 1/2 gallon of vodka  1 - Frequency: daily  1 - Duration: years 1 - Last Use / Amount: 14 months, with one day relapse recently  Name of Substance 2: Adderall 2 - Age of First Use: 2011 2 - Amount (size/oz): unknown 2 - Frequency: "days at a time" 2 - Duration: episodes of abuse, reports medication works really well when he takes as prescribed 2 - Last Use / Amount: 06-24-15 Name of Substance 3: opioids 3 - Age of First Use: teens 3 - Amount (size/oz): 80-160 mg per day 3 - Frequency: daily  3 - Duration: on  and off 3 - Last Use / Amount: 1-2 years ago  Name of Substance 4: THC  4 - Last Use / Amount: 5-6 years ago              Allergies:   Allergies  Allergen Reactions  . Sulfa Antibiotics Other (See Comments)    Unknown reaction    Labs:  Results for orders placed or performed during the hospital encounter of 06/25/15 (from the past 48 hour(s))  Acetaminophen level     Status: Abnormal   Collection Time: 06/25/15  6:00 AM  Result Value Ref Range   Acetaminophen (Tylenol), Serum <10 (L) 10 - 30 ug/mL    Comment:        THERAPEUTIC CONCENTRATIONS VARY SIGNIFICANTLY. A RANGE OF 10-30 ug/mL MAY BE AN EFFECTIVE CONCENTRATION FOR MANY PATIENTS. HOWEVER, SOME ARE BEST TREATED AT CONCENTRATIONS OUTSIDE THIS RANGE. ACETAMINOPHEN CONCENTRATIONS >150 ug/mL AT 4 HOURS AFTER INGESTION AND >50 ug/mL AT 12 HOURS AFTER INGESTION ARE OFTEN ASSOCIATED WITH TOXIC REACTIONS.   Comprehensive metabolic panel     Status: Abnormal   Collection Time: 06/25/15  6:00 AM  Result Value Ref Range   Sodium 137 135 - 145 mmol/L   Potassium 2.9 (L) 3.5 - 5.1 mmol/L   Chloride 102 101 - 111 mmol/L   CO2 24 22 - 32 mmol/L   Glucose, Bld 101 (H) 65 - 99 mg/dL   BUN 12 6 - 20 mg/dL   Creatinine, Ser 1.04 0.61 - 1.24 mg/dL   Calcium 8.9 8.9 - 10.3 mg/dL   Total Protein 7.3 6.5 - 8.1 g/dL   Albumin 4.3 3.5 - 5.0 g/dL   AST 32 15 - 41 U/L   ALT 37 17 - 63 U/L   Alkaline Phosphatase 40 38 - 126 U/L   Total Bilirubin 0.5 0.3 - 1.2 mg/dL   GFR calc non Af Amer >60 >60 mL/min   GFR calc Af Amer >60 >60 mL/min    Comment: (NOTE) The eGFR has been calculated using the CKD EPI equation. This calculation has not been validated in all clinical situations. eGFR's persistently <60 mL/min signify possible Chronic Kidney Disease.    Anion gap 11 5 - 15  Ethanol (ETOH)     Status: None   Collection Time: 06/25/15  6:00 AM  Result Value Ref Range   Alcohol, Ethyl (B) <5 <5 mg/dL    Comment:        LOWEST  DETECTABLE LIMIT FOR SERUM ALCOHOL IS 5 mg/dL FOR MEDICAL PURPOSES ONLY   Salicylate level     Status: None   Collection Time: 06/25/15  6:00 AM  Result Value Ref Range   Salicylate Lvl <3.7 2.8 - 30.0 mg/dL  CBC with Differential     Status: Abnormal   Collection Time: 06/25/15  6:00  AM  Result Value Ref Range   WBC 12.5 (H) 4.0 - 10.5 K/uL   RBC 4.69 4.22 - 5.81 MIL/uL   Hemoglobin 14.1 13.0 - 17.0 g/dL   HCT 41.2 39.0 - 52.0 %   MCV 87.8 78.0 - 100.0 fL   MCH 30.1 26.0 - 34.0 pg   MCHC 34.2 30.0 - 36.0 g/dL   RDW 13.2 11.5 - 15.5 %   Platelets 336 150 - 400 K/uL   Neutrophils Relative % 65 43 - 77 %   Neutro Abs 8.2 (H) 1.7 - 7.7 K/uL   Lymphocytes Relative 24 12 - 46 %   Lymphs Abs 2.9 0.7 - 4.0 K/uL   Monocytes Relative 9 3 - 12 %   Monocytes Absolute 1.2 (H) 0.1 - 1.0 K/uL   Eosinophils Relative 1 0 - 5 %   Eosinophils Absolute 0.1 0.0 - 0.7 K/uL   Basophils Relative 1 0 - 1 %   Basophils Absolute 0.1 0.0 - 0.1 K/uL  Urine rapid drug screen (hosp performed)not at [facility]     Status: Abnormal   Collection Time: 06/25/15  6:53 AM  Result Value Ref Range   Opiates NONE DETECTED NONE DETECTED   Cocaine NONE DETECTED NONE DETECTED   Benzodiazepines NONE DETECTED NONE DETECTED   Amphetamines POSITIVE (A) NONE DETECTED   Tetrahydrocannabinol NONE DETECTED NONE DETECTED   Barbiturates NONE DETECTED NONE DETECTED    Comment:        DRUG SCREEN FOR MEDICAL PURPOSES ONLY.  IF CONFIRMATION IS NEEDED FOR ANY PURPOSE, NOTIFY LAB WITHIN 5 DAYS.        LOWEST DETECTABLE LIMITS FOR URINE DRUG SCREEN Drug Class       Cutoff (ng/mL) Amphetamine      1000 Barbiturate      200 Benzodiazepine   863 Tricyclics       817 Opiates          300 Cocaine          300 THC              50     Vitals: Blood pressure 139/90, pulse 103, temperature 97.9 F (36.6 C), temperature source Oral, resp. rate 18, SpO2 100 %.  Risk to Self: Suicidal Ideation: No Suicidal Intent: No Is patient  at risk for suicide?: Yes Suicidal Plan?: No Access to Means: No What has been your use of drugs/alcohol within the last 12 months?: Pt has hx of abusing etoh, THC, opiods, and amphetamines. Currently reports only abusing adderall  How many times?: 3 Other Self Harm Risks: none Triggers for Past Attempts: Other (Comment) (felt like making progress then something goes wrong) Intentional Self Injurious Behavior: None Risk to Others: Homicidal Ideation: No Thoughts of Harm to Others: No Current Homicidal Intent: No Current Homicidal Plan: No Access to Homicidal Means: No Identified Victim: none History of harm to others?: Yes Assessment of Violence: In distant past Violent Behavior Description: reports would get into fights when he was drinking  Does patient have access to weapons?: No Criminal Charges Pending?: No Does patient have a court date: No Prior Inpatient Therapy: Prior Inpatient Therapy: Yes Prior Therapy Dates: 02/2014, and 01/2014 Prior Therapy Facilty/Provider(s): [facility] Reason for Treatment: SA, psychosis  Prior Outpatient Therapy: Prior Outpatient Therapy: Yes Prior Therapy Dates: current  Prior Therapy Facilty/Provider(s): Dr. Rosine Door medication management, attends [facility] (Previously saw counselor Shana Chute) Reason for Treatment: SA, depression, ADHD- medication management  Does patient have an ACCT team?: No  Does patient have Intensive In-House Services?  : No Does patient have [facility] services? : No Does patient have [facility] services?: No  Current Facility-Administered Medications  Medication Dose Route Frequency Provider Last Rate Last Dose  . alum & mag hydroxide-simeth (MAALOX/MYLANTA) 200-200-20 MG/5ML suspension 30 mL  30 mL Oral PRN Shanon Rosser, MD      . ibuprofen (ADVIL,MOTRIN) tablet 600 mg  600 mg Oral Q8H PRN Shanon Rosser, MD      . LORazepam (ATIVAN) tablet 1 mg  1 mg Oral Q8H PRN Shanon Rosser, MD   1 mg at 06/25/15 0935  . ondansetron (ZOFRAN) tablet 4 mg  4 mg  Oral Q8H PRN John Molpus, MD      . ziprasidone (GEODON) capsule 40 mg  40 mg Oral BID WC Shanon Rosser, MD   40 mg at 06/25/15 3893   Current Outpatient Prescriptions  Medication Sig Dispense Refill  . 5-Hydroxytryptophan (5-HTP PO) Take 1 capsule by mouth daily.    Marland Kitchen amitriptyline (ELAVIL) 25 MG tablet Take 1 tablet (25 mg total) by mouth at bedtime. For sleep/depression (Patient taking differently: Take 75 mg by mouth at bedtime as needed for sleep. For sleep/depression) 30 tablet 0  . OVER THE COUNTER MEDICATION Take 1 capsule by mouth daily. Tianeptine    . OVER THE COUNTER MEDICATION Take 1 capsule by mouth daily. Phenibut    . predniSONE (DELTASONE) 20 MG tablet Take 20 mg by mouth daily with breakfast.    . carbamazepine (TEGRETOL XR) 200 MG 12 hr tablet Take 1 tablet (200 mg total) by mouth 2 (two) times daily. For mood stabilization (Patient not taking: Reported on 06/25/2015) 60 tablet 0  . hydrOXYzine (ATARAX/VISTARIL) 25 MG tablet Take 1 tablet (25 mg total) by mouth every 4 (four) hours as needed for anxiety (Sleep). (Patient not taking: Reported on 06/25/2015) 120 tablet 0  . lithium carbonate (LITHOBID) 300 MG CR tablet Take 1 tablet (300 mg total) by mouth every 12 (twelve) hours. For mood stabilization (Patient not taking: Reported on 06/25/2015) 60 tablet 0  . traZODone (DESYREL) 100 MG tablet Take 1 tablet (100 mg total) by mouth at bedtime as needed for sleep. (Patient not taking: Reported on 06/25/2015) 30 tablet 0    Musculoskeletal: Strength & Muscle Tone: within normal limits Gait & Station: normal Patient leans: N/A  Psychiatric Specialty Exam: Physical Exam  Review of Systems  Constitutional: Negative.   HENT: Negative.   Eyes: Negative.   Respiratory: Negative.   Cardiovascular: Negative.   Gastrointestinal: Negative.   Genitourinary: Negative.   Musculoskeletal: Positive for joint pain (reports hx of Rheumatoid Arthritis and is on Ptrednisone ).  Skin: Negative.    Neurological: Negative.   Endo/Heme/Allergies: Negative.     Blood pressure 139/90, pulse 103, temperature 97.9 F (36.6 C), temperature source Oral, resp. rate 18, SpO2 100 %.There is no weight on file to calculate BMI.  General Appearance: Casual and Disheveled  Eye Contact::  Minimal  Speech:  Clear and Coherent  Volume:  Normal  Mood:  Depressed  Affect:  Congruent  Thought Process:  Disorganized  Orientation:  Full (Time, Place, and Person)  Thought Content:  Hallucinations: Auditory  Suicidal Thoughts:  No  Homicidal Thoughts:  No  Memory:  Immediate;   Good Recent;   Good Remote;   Fair  Judgement:  Impaired  Insight:  Shallow  Psychomotor Activity:  Normal  Concentration:  Fair  Recall:  NA  [facility] [place]  Language:  Good  Akathisia:  NA  Handed:  Right  AIMS (if indicated):     Assets:  Desire for Improvement  ADL's:  Impaired  Cognition: WNL  Sleep:      Medical Decision Making: Review of Psycho-Social Stressors (1) and Established Problem, Worsening (2)  Treatment Plan Summary: Daily contact with patient to assess and evaluate symptoms and progress in treatment and Medication management  Plan:  We will start patient on Haldol 5 mg po bid for mood control, Cogentin 0.5 mg po bid for EPS, Lamictal 25 mg po daily for mood stabilization and Trazodone 50 mg po at bed time for sleep.   Disposition: Admit to inpatient Psychiatric unit  Delfin Gant   PMHNP-BC 06/25/2015 4:35 PM Patient seen face-to-face for psychiatric evaluation, chart reviewed and case discussed with the physician extender and developed treatment plan. Reviewed the information documented and agree with the treatment plan. Corena Pilgrim, MD

## 2015-06-25 NOTE — ED Notes (Signed)
Pt visible in dayroom post breakfast. Observed to be anxious and irritated, pacing and restless in milieu/halls. Verbal demands made requesting discharged. Pt used phone multiple times in an attempt to contact family to no avail. PRN Ativan 1 mg PO given at 0935 for anxiety. Pt asleep in bed when reassessed at 1035. Safety maintained on Q 15 minutes checks and pt monitored as such without behavioral outburst at this time.

## 2015-06-25 NOTE — ED Notes (Signed)
Patient accompanied by girlfriend, who says that patient is having a psychotic break.  She says he has been hearing voices for the past two days at home.  Denies SI/HI.

## 2015-06-25 NOTE — ED Notes (Signed)
TTS at bedside. 

## 2015-06-26 DIAGNOSIS — F251 Schizoaffective disorder, depressive type: Secondary | ICD-10-CM | POA: Insufficient documentation

## 2015-06-26 NOTE — BH Assessment (Signed)
Dr. Jannifer Franklin and Julieanne Cotton, NP recommend discharge home. Patient has a follow up appointment at the following location: [facility] , [facility] [address] [place], [place] [address]. Patient will with seeing the psychiatrist Dr. Omelia Blackwater for medication management. Patient sts that he has a follow up appointment August 09, 2015. Patient is unsure of the time of his appointment. Writer offered to contact the facility with his signed consent in order to obtain his appointment time for August 09, 2015 and/or request a much sooner appointment. Patient declined this writers assistance stating, "I will take care of it myself".

## 2015-06-26 NOTE — BH Assessment (Signed)
[facility] Assessment Progress Note  Per Thedore Mins, MD, this pt does not require psychiatric hospitalization at this time.  He is to be discharged from [facility] with outpatient referrals.  Contact information for [facility] has been included in pt's discharge instructions.  Pt's nurse has been notified.  Doylene Canning, MA Triage Specialist [phone]

## 2015-06-26 NOTE — BH Assessment (Signed)
Seeking inpt placement. Sent referral to: [facility] Fail, [employer], [place], [place], [place] Triage Specialist 06/26/2015 4:26 AM

## 2015-06-26 NOTE — Discharge Instructions (Signed)
For your ongoing mental health needs, you are advised to follow up with [facility].  New and returning patients are seen at their walk-in clinic.  Walk-in hours are Monday - Friday from 8:00 am - 3:00 pm.  Walk-in patients are seen on a first come, first served basis.  Try to arrive as early as possible for he best chance of being seen the same day: ° °     [facility] °     [address] °     [place], [place] [address] °     [phone] °

## 2015-06-26 NOTE — Consult Note (Signed)
[facility]   Reason for Cobb:  Schizoaffective disorder, ADHD by hx, r/o Psychosis,  Referring Physician:  EDP Patient Identification: [facility] MRN:  [id number] Principal Diagnosis: Schizoaffective disorder Diagnosis:   Patient Active Problem List   Diagnosis Date Noted  . Schizoaffective disorder [F25.9] 02/11/2014    Priority: High  . Alcohol dependence [F10.20] 02/13/2014  . Cannabis abuse [F12.10] 02/13/2014  . Bipolar disorder [F31.9] 02/11/2014    Total Time spent with patient: 1 hour  Subjective:   Curtis Cobb is a 44 y.o. male patient admitted with Schizoaffective disorder,  ADHD by hx, R/O Psychosis, .  HPI: Caucasian male, 44 years old was evaluated for auditory hallucination.  Patient has a documented hx of Schizoaffective disorder and was hospitalized at [facility] last year February.  Patient reports that he is hearing voices  Because My spirit is out of alignment"  Patient reported that the voices are telling him that he is not a good person and God is making him hear voices to correct his behavior.  Patient also stated that he is constantly feeling guilty because he does not treat people well.  Patient   Reports a diagnosis of ADHD and stated that he receives Adderall from his PMD.  Patient also reports a diagnosis of Rheumatoid Arthritis and takes Prednisone everyday.  Patient reports a family hx of Schizophrenia by his maternal grand father.  Patient denies any substance use including Alcohol for 15 months.  Patient has been accepted for admission and we will be looking for placement.  Patient denies SI/HI [facility] but is hearing voices.  Patient have asked to be discharged home.  We may secure IVC status if he is determined to be a danger to self and others.  Today Patient denies SI/HI/AVH.  He is more organized  and denies hearing God telling him that he has been behaving badly to people.  He, however admitted that he need to change his life style of  lying to people and cheating on his girl Friend.  He also admitted that he has been taking 200 MG Adderall daily instead of 60 mg prescribed by his [facility]  Patient also stated he has not been taking any medications for Schizoaffective disorder and vehemently stated today that he will not take any medications besides Adderall.  Patient is now discharged, will see  Psychiatrist at [facility] and then go back to see [facility].  Patient has agreed to take correct dose of his Adderall.  HPI Elements:   Location:  Schizoaffective disorder, ADHD BY Hx, R/O Psychosis.. Quality:  severe. Severity:  severe. Timing:  Acute. Duration:  Chronic mental illness. Context:  Seeking treatment for Auditory hallucination..  Past Medical History:  Past Medical History  Diagnosis Date  . Bipolar 1 disorder   . Schizophrenia   . Hypertension   . Depression   . Anxiety   . Rheumatoid arthritis    History reviewed. No pertinent past surgical history. Family History: No family history on file. Social History:  History  Alcohol Use No     History  Drug Use No    Comment: THC    History   Social History  . Marital Status: Single    Spouse Name: N/A  . Number of Children: N/A  . Years of Education: N/A   Social History Main Topics  . Smoking status: Former Smoker -- 0.00 packs/day for 0 years  . Smokeless tobacco: Not on file  . Alcohol Use:  No  . Drug Use: No     Comment: THC  . Sexual Activity: Yes    Birth Control/ Protection: None   Other Topics Concern  . None   Social History Narrative   Additional Social History:    Pain Medications: Reports hx of abuse, reports no abuse in last 1-2 years Prescriptions: See PTA, reports taking more than prescribed adderall in last couple of weeks  Over the Counter: See PTA History of alcohol / drug use?: Yes Longest period of sobriety (when/how long): 14 months for etoh, 2 years opioids, and 5-6 years for [facility]  Negative Consequences of Use: Personal  relationships Withdrawal Symptoms:  (none reported at this time) Name of Substance 1: etoh  1 - Age of First Use: [age] 1 - Amount (size/oz): 1/5 to 1/2 gallon of vodka  1 - Frequency: daily  1 - Duration: years 1 - Last Use / Amount: 14 months, with one day relapse recently  Name of Substance 2: Adderall 2 - Age of First Use: 2011 2 - Amount (size/oz): unknown 2 - Frequency: "days at a time" 2 - Duration: episodes of abuse, reports medication works really well when he takes as prescribed 2 - Last Use / Amount: 06-24-15 Name of Substance 3: opioids 3 - Age of First Use: teens 3 - Amount (size/oz): 80-160 mg per day 3 - Frequency: daily  3 - Duration: on and off 3 - Last Use / Amount: 1-2 years ago  Name of Substance 4: THC  4 - Last Use / Amount: 5-6 years ago              Allergies:   Allergies  Allergen Reactions  . Sulfa Antibiotics Other (See Comments)    Unknown reaction    Labs:  Results for orders placed or performed during the [facility] encounter of 06/25/15 (from the past 48 hour(s))  Acetaminophen level     Status: Abnormal   Collection Time: 06/25/15  6:00 AM  Result Value Ref Range   Acetaminophen (Tylenol), Serum <10 (L) 10 - 30 ug/mL    Comment:        THERAPEUTIC CONCENTRATIONS VARY SIGNIFICANTLY. A RANGE OF 10-30 ug/mL MAY BE AN EFFECTIVE CONCENTRATION FOR MANY PATIENTS. HOWEVER, SOME ARE BEST TREATED AT CONCENTRATIONS OUTSIDE THIS RANGE. ACETAMINOPHEN CONCENTRATIONS >150 ug/mL AT 4 HOURS AFTER INGESTION AND >50 ug/mL AT 12 HOURS AFTER INGESTION ARE OFTEN ASSOCIATED WITH TOXIC REACTIONS.   Comprehensive metabolic panel     Status: Abnormal   Collection Time: 06/25/15  6:00 AM  Result Value Ref Range   Sodium 137 135 - 145 mmol/L   Potassium 2.9 (L) 3.5 - 5.1 mmol/L   Chloride 102 101 - 111 mmol/L   CO2 24 22 - 32 mmol/L   Glucose, Bld 101 (H) 65 - 99 mg/dL   BUN 12 6 - 20 mg/dL   Creatinine, Ser 1.04 0.61 - 1.24 mg/dL   Calcium 8.9 8.9 -  10.3 mg/dL   Total Protein 7.3 6.5 - 8.1 g/dL   Albumin 4.3 3.5 - 5.0 g/dL   AST 32 15 - 41 U/L   ALT 37 17 - 63 U/L   Alkaline Phosphatase 40 38 - 126 U/L   Total Bilirubin 0.5 0.3 - 1.2 mg/dL   GFR calc non Af Amer >60 >60 mL/min   GFR calc Af Amer >60 >60 mL/min    Comment: (NOTE) The eGFR has been calculated using the CKD EPI equation. This calculation has not been  validated in all clinical situations. eGFR's persistently <60 mL/min signify possible Chronic Kidney Disease.    Anion gap 11 5 - 15  Ethanol (ETOH)     Status: None   Collection Time: 06/25/15  6:00 AM  Result Value Ref Range   Alcohol, Ethyl (B) <5 <5 mg/dL    Comment:        LOWEST DETECTABLE LIMIT FOR SERUM ALCOHOL IS 5 mg/dL FOR MEDICAL PURPOSES ONLY   Salicylate level     Status: None   Collection Time: 06/25/15  6:00 AM  Result Value Ref Range   Salicylate Lvl <2.8 2.8 - 30.0 mg/dL  CBC with Differential     Status: Abnormal   Collection Time: 06/25/15  6:00 AM  Result Value Ref Range   WBC 12.5 (H) 4.0 - 10.5 K/uL   RBC 4.69 4.22 - 5.81 MIL/uL   Hemoglobin 14.1 13.0 - 17.0 g/dL   HCT 41.2 39.0 - 52.0 %   MCV 87.8 78.0 - 100.0 fL   MCH 30.1 26.0 - 34.0 pg   MCHC 34.2 30.0 - 36.0 g/dL   RDW 13.2 11.5 - 15.5 %   Platelets 336 150 - 400 K/uL   Neutrophils Relative % 65 43 - 77 %   Neutro Abs 8.2 (H) 1.7 - 7.7 K/uL   Lymphocytes Relative 24 12 - 46 %   Lymphs Abs 2.9 0.7 - 4.0 K/uL   Monocytes Relative 9 3 - 12 %   Monocytes Absolute 1.2 (H) 0.1 - 1.0 K/uL   Eosinophils Relative 1 0 - 5 %   Eosinophils Absolute 0.1 0.0 - 0.7 K/uL   Basophils Relative 1 0 - 1 %   Basophils Absolute 0.1 0.0 - 0.1 K/uL  Urine rapid drug screen (hosp performed)not at [facility]     Status: Abnormal   Collection Time: 06/25/15  6:53 AM  Result Value Ref Range   Opiates NONE DETECTED NONE DETECTED   Cocaine NONE DETECTED NONE DETECTED   Benzodiazepines NONE DETECTED NONE DETECTED   Amphetamines POSITIVE (A) NONE DETECTED    Tetrahydrocannabinol NONE DETECTED NONE DETECTED   Barbiturates NONE DETECTED NONE DETECTED    Comment:        DRUG SCREEN FOR MEDICAL PURPOSES ONLY.  IF CONFIRMATION IS NEEDED FOR ANY PURPOSE, NOTIFY LAB WITHIN 5 DAYS.        LOWEST DETECTABLE LIMITS FOR URINE DRUG SCREEN Drug Class       Cutoff (ng/mL) Amphetamine      1000 Barbiturate      200 Benzodiazepine   366 Tricyclics       294 Opiates          300 Cocaine          300 THC              50     Vitals: Blood pressure 119/78, pulse 90, temperature 98.2 F (36.8 C), temperature source Oral, resp. rate 18, SpO2 99 %.  Risk to Self: Suicidal Ideation: No Suicidal Intent: No Is patient at risk for suicide?: Yes Suicidal Plan?: No Access to Means: No What has been your use of drugs/alcohol within the last 12 months?: Pt has hx of abusing etoh, THC, opiods, and amphetamines. Currently reports only abusing adderall  How many times?: 3 Other Self Harm Risks: none Triggers for Past Attempts: Other (Comment) (felt like making progress then something goes wrong) Intentional Self Injurious Behavior: None Risk to Others: Homicidal Ideation: No Thoughts of Harm to  Others: No Current Homicidal Intent: No Current Homicidal Plan: No Access to Homicidal Means: No Identified Victim: none History of harm to others?: Yes Assessment of Violence: In distant past Violent Behavior Description: reports would get into fights when he was drinking  Does patient have access to weapons?: No Criminal Charges Pending?: No Does patient have a court date: No Prior Inpatient Therapy: Prior Inpatient Therapy: Yes Prior Therapy Dates: 02/2014, and 01/2014 Prior Therapy Facilty/Provider(s): [facility] Reason for Treatment: SA, psychosis  Prior Outpatient Therapy: Prior Outpatient Therapy: Yes Prior Therapy Dates: current  Prior Therapy Facilty/Provider(s): [facility] medication management, attends [facility] (Previously saw counselor [facility]) Reason for  Treatment: SA, depression, ADHD- medication management  Does patient have an ACCT team?: No Does patient have Intensive In-House Services?  : No Does patient have [facility] services? : No Does patient have P4CC services?: No  Current Facility-Administered Medications  Medication Dose Route Frequency Provider Last Rate Last Dose  . alum & mag hydroxide-simeth (MAALOX/MYLANTA) 200-200-20 MG/5ML suspension 30 mL  30 mL Oral PRN Shanon Rosser, MD      . benztropine (COGENTIN) tablet 0.5 mg  0.5 mg Oral BID Delfin Gant, NP   0.5 mg at 06/26/15 1012  . haloperidol (HALDOL) tablet 5 mg  5 mg Oral BID Delfin Gant, NP   5 mg at 06/26/15 1012  . ibuprofen (ADVIL,MOTRIN) tablet 600 mg  600 mg Oral Q8H PRN Shanon Rosser, MD      . lamoTRIgine (LAMICTAL) tablet 25 mg  25 mg Oral Daily Delfin Gant, NP   25 mg at 06/26/15 1013  . LORazepam (ATIVAN) tablet 1 mg  1 mg Oral Q8H PRN Shanon Rosser, MD   1 mg at 06/25/15 0935  . ondansetron (ZOFRAN) tablet 4 mg  4 mg Oral Q8H PRN Shanon Rosser, MD      . traZODone (DESYREL) tablet 50 mg  50 mg Oral QHS Delfin Gant, NP   50 mg at 06/25/15 2141   Current Outpatient Prescriptions  Medication Sig Dispense Refill  . 5-Hydroxytryptophan (5-HTP PO) Take 1 capsule by mouth daily.    Marland Kitchen amitriptyline (ELAVIL) 25 MG tablet Take 1 tablet (25 mg total) by mouth at bedtime. For sleep/depression (Patient taking differently: Take 75 mg by mouth at bedtime as needed for sleep. For sleep/depression) 30 tablet 0  . OVER THE COUNTER MEDICATION Take 1 capsule by mouth daily. Tianeptine    . OVER THE COUNTER MEDICATION Take 1 capsule by mouth daily. Phenibut    . predniSONE (DELTASONE) 20 MG tablet Take 20 mg by mouth daily with breakfast.    . carbamazepine (TEGRETOL XR) 200 MG 12 hr tablet Take 1 tablet (200 mg total) by mouth 2 (two) times daily. For mood stabilization (Patient not taking: Reported on 06/25/2015) 60 tablet 0  . hydrOXYzine (ATARAX/VISTARIL) 25 MG  tablet Take 1 tablet (25 mg total) by mouth every 4 (four) hours as needed for anxiety (Sleep). (Patient not taking: Reported on 06/25/2015) 120 tablet 0  . lithium carbonate (LITHOBID) 300 MG CR tablet Take 1 tablet (300 mg total) by mouth every 12 (twelve) hours. For mood stabilization (Patient not taking: Reported on 06/25/2015) 60 tablet 0  . traZODone (DESYREL) 100 MG tablet Take 1 tablet (100 mg total) by mouth at bedtime as needed for sleep. (Patient not taking: Reported on 06/25/2015) 30 tablet 0    Musculoskeletal: Strength & Muscle Tone: within normal limits Gait & Station: normal Patient leans: N/A  Psychiatric Specialty Exam: Physical Exam  Review of Systems  Constitutional: Negative.   HENT: Negative.   Eyes: Negative.   Respiratory: Negative.   Cardiovascular: Negative.   Gastrointestinal: Negative.   Genitourinary: Negative.   Musculoskeletal: Positive for joint pain (reports hx of Rheumatoid Arthritis and is on Ptrednisone ).  Skin: Negative.   Neurological: Negative.   Endo/Heme/Allergies: Negative.     Blood pressure 119/78, pulse 90, temperature 98.2 F (36.8 C), temperature source Oral, resp. rate 18, SpO2 99 %.There is no weight on file to calculate BMI.  General Appearance: Casual and Disheveled  Eye Contact::  Good  Speech:  Clear and Coherent  Volume:  Normal  Mood:  Depressed  Affect:  Congruent  Thought Process:  Coherent, Goal Directed and Intact  Orientation:  Full (Time, Place, and Person)  Thought Content:  WDL  Suicidal Thoughts:  No  Homicidal Thoughts:  No  Memory:  Immediate;   Good Recent;   Good Remote;   Fair  Judgement:  Fair  Insight:  Shallow  Psychomotor Activity:  Normal  Concentration:  Good  Recall:  NA  Fund of Knowledge:Fair  Language: Good  Akathisia:  NA  Handed:  Right  AIMS (if indicated):     Assets:  Desire for Improvement  ADL's:  Impaired  Cognition: WNL  Sleep:      Medical Decision Making: Established  Problem, Stable/Improving (1)  Disposition:  Discharge home, follow up with [facility] for medication management pending your appointment with [facility]   [facility] 06/26/2015 11:40 AM  Patient seen face-to-face for psychiatric evaluation, chart reviewed and case discussed with the physician extender and developed treatment plan. Reviewed the information documented and agree with the treatment plan. Corena Pilgrim, MD

## 2015-07-10 ENCOUNTER — Emergency Department (HOSPITAL_COMMUNITY)
Admission: EM | Admit: 2015-07-10 | Discharge: 2015-07-12 | Disposition: A | Discharge status: Other Acute Inpatient Hospital | Payer: Federal, State, Local not specified - Other | Attending: Emergency Medicine | Admitting: Emergency Medicine

## 2015-07-10 ENCOUNTER — Encounter (HOSPITAL_COMMUNITY): Payer: Self-pay | Admitting: Emergency Medicine

## 2015-07-10 DIAGNOSIS — Z79899 Other long term (current) drug therapy: Secondary | ICD-10-CM | POA: Insufficient documentation

## 2015-07-10 DIAGNOSIS — I10 Essential (primary) hypertension: Secondary | ICD-10-CM | POA: Insufficient documentation

## 2015-07-10 DIAGNOSIS — R509 Fever, unspecified: Secondary | ICD-10-CM | POA: Insufficient documentation

## 2015-07-10 DIAGNOSIS — Z87891 Personal history of nicotine dependence: Secondary | ICD-10-CM | POA: Insufficient documentation

## 2015-07-10 DIAGNOSIS — Z7952 Long term (current) use of systemic steroids: Secondary | ICD-10-CM | POA: Insufficient documentation

## 2015-07-10 DIAGNOSIS — F319 Bipolar disorder, unspecified: Secondary | ICD-10-CM | POA: Insufficient documentation

## 2015-07-10 DIAGNOSIS — F1510 Other stimulant abuse, uncomplicated: Secondary | ICD-10-CM | POA: Insufficient documentation

## 2015-07-10 DIAGNOSIS — R000 Tachycardia, unspecified: Secondary | ICD-10-CM | POA: Insufficient documentation

## 2015-07-10 DIAGNOSIS — F251 Schizoaffective disorder, depressive type: Secondary | ICD-10-CM | POA: Diagnosis present

## 2015-07-10 DIAGNOSIS — F419 Anxiety disorder, unspecified: Secondary | ICD-10-CM | POA: Insufficient documentation

## 2015-07-10 DIAGNOSIS — F259 Schizoaffective disorder, unspecified: Secondary | ICD-10-CM

## 2015-07-10 DIAGNOSIS — Z8739 Personal history of other diseases of the musculoskeletal system and connective tissue: Secondary | ICD-10-CM | POA: Insufficient documentation

## 2015-07-10 DIAGNOSIS — F22 Delusional disorders: Secondary | ICD-10-CM

## 2015-07-10 LAB — RAPID URINE DRUG SCREEN, HOSP PERFORMED
Amphetamines: POSITIVE — AB
BARBITURATES: NOT DETECTED
BENZODIAZEPINES: NOT DETECTED
Cocaine: NOT DETECTED
Opiates: NOT DETECTED
Tetrahydrocannabinol: NOT DETECTED

## 2015-07-10 LAB — COMPREHENSIVE METABOLIC PANEL
ALK PHOS: 50 U/L (ref 38–126)
ALT: 47 U/L (ref 17–63)
ANION GAP: 16 — AB (ref 5–15)
AST: 38 U/L (ref 15–41)
Albumin: 4.6 g/dL (ref 3.5–5.0)
BUN: 17 mg/dL (ref 6–20)
CO2: 19 mmol/L — ABNORMAL LOW (ref 22–32)
Calcium: 9.9 mg/dL (ref 8.9–10.3)
Chloride: 104 mmol/L (ref 101–111)
Creatinine, Ser: 1.2 mg/dL (ref 0.61–1.24)
GFR calc non Af Amer: >60 mL/min (ref >60)
Glucose, Bld: 125 mg/dL — ABNORMAL HIGH (ref 65–99)
POTASSIUM: 3.4 mmol/L — AB (ref 3.5–5.1)
Sodium: 139 mmol/L (ref 135–145)
TOTAL PROTEIN: 7.9 g/dL (ref 6.5–8.1)
Total Bilirubin: 2.1 mg/dL — ABNORMAL HIGH (ref 0.3–1.2)

## 2015-07-10 LAB — CBC
HCT: 46.4 % (ref 39.0–52.0)
Hemoglobin: 16.2 g/dL (ref 13.0–17.0)
MCH: 30.5 pg (ref 26.0–34.0)
MCHC: 34.9 g/dL (ref 30.0–36.0)
MCV: 87.4 fL (ref 78.0–100.0)
Platelets: 323 10*3/uL (ref 150–400)
RBC: 5.31 MIL/uL (ref 4.22–5.81)
RDW: 14.1 % (ref 11.5–15.5)
WBC: 24 10*3/uL — ABNORMAL HIGH (ref 4.0–10.5)

## 2015-07-10 LAB — LITHIUM LEVEL

## 2015-07-10 LAB — ETHANOL: Alcohol, Ethyl (B): <5 mg/dL (ref <5)

## 2015-07-10 LAB — SALICYLATE LEVEL: Salicylate Lvl: <4 mg/dL (ref 2.8–30.0)

## 2015-07-10 MED ORDER — SODIUM CHLORIDE 0.9 % IV BOLUS (SEPSIS)
1000.0000 mL | Freq: Once | INTRAVENOUS | Status: AC
  Administered 2015-07-10: 1000 mL via INTRAVENOUS

## 2015-07-10 MED ORDER — AMITRIPTYLINE HCL 25 MG PO TABS: 75.0000 mg | ORAL_TABLET | Freq: Every evening | ORAL | Status: DC | PRN

## 2015-07-10 MED ORDER — TRAZODONE HCL 100 MG PO TABS
100.0000 mg | ORAL_TABLET | Freq: Every evening | ORAL | Status: DC | PRN
  Administered 2015-07-11: 100 mg via ORAL
  Filled 2015-07-10: qty 1

## 2015-07-10 MED ORDER — HYDROXYZINE HCL 25 MG PO TABS
25.0000 mg | ORAL_TABLET | ORAL | Status: DC | PRN
  Administered 2015-07-11 – 2015-07-12 (×3): 25 mg via ORAL
  Filled 2015-07-10 (×3): qty 1

## 2015-07-10 MED ORDER — HALOPERIDOL LACTATE 5 MG/ML IJ SOLN
5.0000 mg | Freq: Once | INTRAMUSCULAR | Status: AC
  Administered 2015-07-10: 5 mg via INTRAVENOUS
  Filled 2015-07-10: qty 1

## 2015-07-10 MED ORDER — ALUM & MAG HYDROXIDE-SIMETH 200-200-20 MG/5ML PO SUSP: 30.0000 mL | ORAL | Status: DC | PRN

## 2015-07-10 MED ORDER — IBUPROFEN 200 MG PO TABS: 600.0000 mg | ORAL_TABLET | Freq: Three times a day (TID) | ORAL | Status: DC | PRN

## 2015-07-10 MED ORDER — CARBAMAZEPINE ER 200 MG PO TB12
200.0000 mg | ORAL_TABLET | Freq: Two times a day (BID) | ORAL | Status: DC
  Administered 2015-07-11 – 2015-07-12 (×4): 200 mg via ORAL
  Filled 2015-07-10 (×7): qty 1

## 2015-07-10 MED ORDER — ACETAMINOPHEN 325 MG PO TABS: 650.0000 mg | ORAL_TABLET | ORAL | Status: DC | PRN

## 2015-07-10 MED ORDER — LITHIUM CARBONATE ER 300 MG PO TBCR
300.0000 mg | EXTENDED_RELEASE_TABLET | Freq: Two times a day (BID) | ORAL | Status: DC
  Administered 2015-07-11 (×2): 300 mg via ORAL
  Filled 2015-07-10 (×4): qty 1

## 2015-07-10 MED ORDER — ZOLPIDEM TARTRATE 5 MG PO TABS
5.0000 mg | ORAL_TABLET | Freq: Every evening | ORAL | Status: DC | PRN
  Administered 2015-07-11: 5 mg via ORAL
  Filled 2015-07-10: qty 1

## 2015-07-10 MED ORDER — LORAZEPAM 2 MG/ML IJ SOLN
1.0000 mg | Freq: Once | INTRAMUSCULAR | Status: AC
  Administered 2015-07-10: 1 mg via INTRAVENOUS
  Filled 2015-07-10: qty 1

## 2015-07-10 MED ORDER — ONDANSETRON HCL 4 MG PO TABS: 4.0000 mg | ORAL_TABLET | Freq: Three times a day (TID) | ORAL | Status: DC | PRN

## 2015-07-10 MED ORDER — HALOPERIDOL LACTATE 5 MG/ML IJ SOLN: 5.0000 mg | Freq: Once | INTRAMUSCULAR | Status: DC

## 2015-07-10 NOTE — ED Notes (Signed)
Patient resting, no complaints at this time.

## 2015-07-10 NOTE — ED Notes (Signed)
He remains extraordinarily fidgety and manic in appearance.  We reassure him that the meds should assist him within ~15 min. Or so.

## 2015-07-10 NOTE — ED Notes (Signed)
Pt has in belonging bag:  Gray pants, black belt, gray tennis shoes, (2) gray shirts, black shirt, black wallet, [place] ID card, [employer] [phone], social security card, EBT card,

## 2015-07-10 NOTE — BH Assessment (Addendum)
Tele Assessment Note  Update Pt has been placed under IVC.  Curtis Cobb is an 44 y.o. male. Returns to ED with complaints of auditory hallucinations. Pt is alert and oriented times 4. He denies SI, HI, or self-harm. He reports he has been abusing adderrall again for the past couple of weeks. He reports when he slowed down amphetamine use his AH declined. He reports sx increased again with adderrall abuse. He reports he has been taking 200+ mg per day, denies use of other drugs or alcohol He reports he has not been taking his mental health medications. Reports increased depression and anxiety, not eating or sleeping, not being able to go to work. He reports AH narrating what he is doing claiming he wants to harm his SO and that the voices are going to set him up. "I came to get help, I don't know what to do." When presented on 06-25-15 he was discharged by psychiatry to follow up with OP resources. Pt has not done this. Denies any other changes since last visit. Pt could not recall outcome of last visit.    From Assessment note by this writer on 06-25-15: Curtis Cobb is an 44 y.o. male presenting to ED with Auditory hallucination that are narrating what he is doing, and telling him they know he wants to hurt and rape his girl friend and that they are going to set him up. Pt reports this started a week or two ago and scares him as the voices seem to see everything he does. Pt reports a similar episode about a year and a half ago. Pt notes at the time of both episodes he was abusing adderall. Pt reports he is under a lot of stress. He was dx with RA in November, "I went to bed normal and woke up unable to move or function." Pt reports he recently moved, has a lot of stress related to work, lack of transportation, and working with his RA which has him very exhausted. At time of assessment pt was very anxious, but cooperative. He denies SI, HI, or self harm. Reports three past suicide attempts when he felt like  he had made progress and was then unable to maintain it.   Pt reports a hx of depression that comes and goes but has been worse lately with onset of RA. Pt reports he is tired all the time, extremely depressed, worried about the future, loss of hope at times, decreased grooming, crying spells, loss of pleasure and loss of motivation. Pt described periods of manic episodes lasting up to weeks at at time with increased risk taking and goal directed behavior. Pt reports last episode was several weeks ago.   Pt reports hx of frequent worry and episodic bouts with panic attacks. Pt denies hx of abuse or neglect. He reports it was traumatic to lose his mother in 2012. He reports mild OCD type behaviors revolving symmetry.   Pt reports hx of abusing etoh, opioid, THC, and adderall. Pt reports he was dx with ADHD years ago and has the sx even when depression and anxiety are well managed. He reports when he takes medication as prescribed he does well but, has fallen in to misusing the medication. Pt reports he drank up to half gallon of vodka per day but as been sober, minus one recent day, for 14 months. He reports no use of THC for 5 years, and no opioids for 1-2 years.  Pt reports family hx of bipolar and  schizophrenia, etoh and drug abuse. No suicide hx reported.     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 Bipolar 1 disorder   . Schizophrenia   . Hypertension   . Depression   . Anxiety   . Rheumatoid arthritis     No past surgical history on file.  Family History: No family history on file.  Social History:  reports that he has quit smoking. He does not have any smokeless tobacco history on file. He reports that he does not drink alcohol or use illicit  drugs.  Additional Social History:  Alcohol / Drug Use Pain Medications: Reports hx of abuse, reports no abuse in last 1-2 years Prescriptions: See PTA, reports taking more than prescribed adderall in last couple of weeks, reports she has been taking 200+  Over the Counter: See PTA History of alcohol / drug use?: Yes Longest period of sobriety (when/how long): 14 months for etoh, 2 years opioids, and 5-6 years for [facility]  Negative Consequences of Use: Personal relationships Withdrawal Symptoms:  (sweating ) Substance #1 Name of Substance 1: etoh  1 - Age of First Use[age] 1 - Amount (size/oz): 1/5 to 1/2 gallon of vodka  1 - Frequency: daily  1 - Duration: years 1 - Last Use / Amount: reports no use since the last relapse a couple of weeks ago, 14 months sober except one day  Substance #2 Name of Substance 2: Adderall 2 - Age of First Use: 2011 2 - Amount (size/oz): 200+mg  2 - Frequency: days at a time 2 - Duration: reports was absusing a couple of weeks ago, then short break in use,  2 - Last Use / Amount: 07-10-15, about 200 mg  Substance #3 Name of Substance 3: opioids 3 - Age of First Use: teens 3 - Amount (size/oz): 80-160 mg per day 3 - Frequency: daily  3 - Duration: on and off 3 - Last Use / Amount: 1-2 years ago  Substance #4 Name of Substance 4: THC  4 - Last Use / Amount: 5-6 years ago   CIWA: CIWA-Ar BP: 124/81 mmHg Pulse Rate: 98 COWS:    PATIENT STRENGTHS: (choose at least two) Supportive family/friends Work skills  Allergies:  Allergies  Allergen Reactions  . Sulfa Antibiotics Other (See Comments)    Unknown reaction    Home Medications:  (Not in a hospital admission)  OB/GYN Status:  No LMP for male patient.  General Assessment Data Location of Assessment: [facility] TTS Assessment: In system Is this a Tele or Face-to-Face Assessment?: Face-to-Face Is this an Initial Assessment or a Re-assessment for this encounter?: Initial Assessment Marital  status: Long term relationship Is patient pregnant?: No Pregnancy Status: No Living Arrangements: Spouse/significant other Can pt return to current living arrangement?: No Admission Status: Involuntary Is patient capable of signing voluntary admission?: Yes Referral Source: Self/Family/Friend Insurance type: None     Crisis Care Plan Living Arrangements: Spouse/significant other Name of Psychiatrist: Dr. Omelia Blackwater  Name of Therapist: formerly Dorcas Mcmurray, none currently   Education Status Is patient currently in school?: No Current Grade: NA Highest grade of school patient has completed: 10 Name of school: NA Contact person: NA  Risk to self with the past 6 months Suicidal Ideation: No Has patient been a risk to  self within the past 6 months prior to admission? : No Suicidal Intent: No Has patient had any suicidal intent within the past 6 months prior to admission? : No Is patient at risk for suicide?: No Suicidal Plan?: No Has patient had any suicidal plan within the past 6 months prior to admission? : No Access to Means: No What has been your use of drugs/alcohol within the last 12 months?: hx of abusing THC, etoh, and pain pills in the past, currently abusing adderall Previous Attempts/Gestures: Yes How many times?: 3 Other Self Harm Risks: none Triggers for Past Attempts: Other (Comment) Intentional Self Injurious Behavior: None Family Suicide History: No Recent stressful life event(s):  (has been unable to work the last three weeks) Persecutory voices/beliefs?: Yes Depression: Yes Depression Symptoms: Despondent, Insomnia, Tearfulness, Isolating, Fatigue, Guilt, Loss of interest in usual pleasures, Feeling worthless/self pity, Feeling angry/irritable Substance abuse history and/or treatment for substance abuse?: Yes Suicide prevention information given to non-admitted patients: Not applicable  Risk to Others within the past 6 months Homicidal Ideation: No Does  patient have any lifetime risk of violence toward others beyond the six months prior to admission? : No Thoughts of Harm to Others: No Current Homicidal Intent: No Current Homicidal Plan: No Access to Homicidal Means: No Identified Victim: none History of harm to others?: No Assessment of Violence: In distant past Violent Behavior Description: reports fighting in the past when he was still drinking  Does patient have access to weapons?: No Criminal Charges Pending?: No Does patient have a court date: No Is patient on probation?: No  Psychosis Hallucinations: Auditory Delusions: Persecutory  Mental Status Report Appearance/Hygiene: Disheveled, In scrubs Eye Contact: Good Motor Activity: Unremarkable Speech: Logical/coherent Level of Consciousness: Alert Mood: Depressed, Anxious Affect: Appropriate to circumstance Anxiety Level: Severe Panic attack frequency: multiple per day  Most recent panic attack: 07-10-15 Thought Processes: Coherent, Relevant Judgement: Impaired Orientation: Person, Place, Time, Situation Obsessive Compulsive Thoughts/Behaviors: Minimal  Cognitive Functioning Concentration: Decreased Memory: Recent Intact, Remote Intact IQ: Average Insight: Fair Impulse Control: Poor Appetite: Poor Weight Loss:  (pt uncertain ) Weight Gain: 0 Sleep: Decreased (5 broken hours ) Total Hours of Sleep: 5 Vegetative Symptoms: Decreased grooming  ADLScreening [facility] Assessment Services) Patient's cognitive ability adequate to safely complete daily activities?: Yes Patient able to express need for assistance with ADLs?: Yes Independently performs ADLs?: Yes (appropriate for developmental age)  Prior Inpatient Therapy Prior Inpatient Therapy: Yes Prior Therapy Dates: 02/2014, and 01/2014 Prior Therapy Facilty/Provider(s): [facility] Reason for Treatment: SA, psychosis   Prior Outpatient Therapy Prior Outpatient Therapy: Yes Prior Therapy Dates: current  Prior Therapy  Facilty/Provider(s): Dr. Omelia Blackwater medication management, attends [facility] Reason for Treatment: SA, depression, ADHD- medication management  Does patient have an ACCT team?: No Does patient have Intensive In-House Services?  : No Does patient have [facility] services? : No Does patient have P4CC services?: No  ADL Screening (condition at time of admission) Patient's cognitive ability adequate to safely complete daily activities?: Yes Is the patient deaf or have difficulty hearing?: No Does the patient have difficulty seeing, even when wearing glasses/contacts?: No Does the patient have difficulty concentrating, remembering, or making decisions?: No Patient able to express need for assistance with ADLs?: Yes Does the patient have difficulty dressing or bathing?: Yes Independently performs ADLs?: Yes (appropriate for developmental age) Does the patient have difficulty walking or climbing stairs?: No Weakness of Legs: Both Weakness of Arms/Hands: Both  Home Assistive Devices/Equipment Home Assistive Devices/Equipment: None  Abuse/Neglect Assessment (Assessment to be complete while patient is alone) Physical Abuse: Denies Verbal Abuse: Denies Sexual Abuse: Denies Exploitation of patient/patient's resources: Denies Self-Neglect: Denies Values / Beliefs Cultural Requests During Hospitalization: None Spiritual Requests During Hospitalization: None   Advance Directives (For Healthcare) Does patient have an advance directive?: No Would patient like information on creating an advanced directive?: No - patient declined information Nutrition Screen- [facility] Patient's home diet: Regular Has the patient recently lost weight without trying?: Patient is unsure Has the patient been eating poorly because of a decreased appetite?: Yes Malnutrition Screening Tool Score: 3  Additional Information 1:1 In Past 12 Months?: No CIRT Risk: No Elopement Risk: No Does patient have medical clearance?:  No     Disposition:  Per Donell Sievert, PA pt meets inpt criteria. He would like pt to be admitted to 300 bed as his sx appear to be drug induced, rather than a 500 bed. Per [facility], there are currently no 300 beds at [facility]. TTS to seek placement.     Clista Bernhardt, [facility] Triage Specialist 07/10/2015 9:10 PM  Disposition Initial Assessment Completed for this Encounter: Yes  Kahleel Fadeley M 07/10/2015 9:03 PM

## 2015-07-10 NOTE — ED Notes (Signed)
He is less fidgety and more calm.  He uses his cell phone.

## 2015-07-10 NOTE — ED Provider Notes (Signed)
CSN: [id number]     Arrival date & time 07/10/15  1618 History   First MD Initiated Contact with Patient 07/10/15 1639     Chief Complaint  Patient presents with  . Hallucinations  . Fever  . Tachycardia    Level V caveat: Altered mental status  HPI Patient brought to the emergency department.  He continues to state "I need help".  He is unable to define this anymore.  His girlfriend reports his had increasing delusional thoughts and has been noncompliant with psychiatric medications.  The patient has a history of schizoaffective disorder as well as bipolar disorder.  She also reports his been abusing Adderall.  Reports auditory hallucinations but does not define this any further.  He denies homicidal or suicidal thoughts.   Past Medical History  Diagnosis Date  . Bipolar 1 disorder   . Schizophrenia   . Hypertension   . Depression   . Anxiety   . Rheumatoid arthritis    No past surgical history on file. No family history on file. History  Substance Use Topics  . Smoking status: Former Smoker -- 0.00 packs/day for 0 years  . Smokeless tobacco: Not on file  . Alcohol Use: No    Review of Systems  Unable to perform ROS     Allergies  Sulfa antibiotics  Home Medications   Prior to Admission medications   Medication Sig Start Date End Date Taking? Authorizing Provider  amphetamine-dextroamphetamine (ADDERALL) 30 MG tablet Take 30 mg by mouth daily.   Yes Historical Provider, MD  5-Hydroxytryptophan (5-HTP PO) Take 1 capsule by mouth daily.    Historical Provider, MD  amitriptyline (ELAVIL) 25 MG tablet Take 1 tablet (25 mg total) by mouth at bedtime. For sleep/depression Patient taking differently: Take 75 mg by mouth at bedtime as needed for sleep. For sleep/depression 03/21/14   Sanjuana Kava, NP  carbamazepine (TEGRETOL XR) 200 MG 12 hr tablet Take 1 tablet (200 mg total) by mouth 2 (two) times daily. For mood stabilization Patient not taking: Reported on 06/25/2015  03/21/14   Sanjuana Kava, NP  hydrOXYzine (ATARAX/VISTARIL) 25 MG tablet Take 1 tablet (25 mg total) by mouth every 4 (four) hours as needed for anxiety (Sleep). Patient not taking: Reported on 06/25/2015 03/21/14   Sanjuana Kava, NP  lithium carbonate (LITHOBID) 300 MG CR tablet Take 1 tablet (300 mg total) by mouth every 12 (twelve) hours. For mood stabilization Patient not taking: Reported on 06/25/2015 03/21/14   Sanjuana Kava, NP  predniSONE (DELTASONE) 20 MG tablet Take 20 mg by mouth daily with breakfast.    Historical Provider, MD  traZODone (DESYREL) 100 MG tablet Take 1 tablet (100 mg total) by mouth at bedtime as needed for sleep. Patient not taking: Reported on 06/25/2015 03/21/14   Sanjuana Kava, NP   BP 123/78 mmHg  Pulse 106  Temp(Src) 98.7 F (37.1 C) (Oral)  Resp 17  SpO2 96% Physical Exam  Constitutional: He is oriented to person, place, and time. He appears well-developed and well-nourished. He appears distressed.  HENT:  Head: Normocephalic and atraumatic.  Eyes: EOM are normal.  Neck: Normal range of motion.  Cardiovascular: Intact distal pulses.   Regular rhythm.  Tachycardic  Pulmonary/Chest: Effort normal and breath sounds normal. No respiratory distress.  Abdominal: Soft. He exhibits no distension. There is no tenderness.  Musculoskeletal: Normal range of motion.  Neurological: He is alert and oriented to person, place, and time.  Skin: Skin is  warm and dry.  Psychiatric:  Appears to be responding to internal stimuli.  Looks around the room.  Unable to communicate well  Nursing note and vitals reviewed.   ED Course  Procedures (including critical care time) Labs Review Labs Reviewed  COMPREHENSIVE METABOLIC PANEL - Abnormal; Notable for the following:    Potassium 3.4 (*)    CO2 19 (*)    Glucose, Bld 125 (*)    Total Bilirubin 2.1 (*)    Anion gap 16 (*)    All other components within normal limits  CBC - Abnormal; Notable for the following:    WBC 24.0  (*)    All other components within normal limits  URINE RAPID DRUG SCREEN, HOSP PERFORMED - Abnormal; Notable for the following:    Amphetamines POSITIVE (*)    All other components within normal limits  LITHIUM LEVEL - Abnormal; Notable for the following:    Lithium Lvl <0.06 (*)    All other components within normal limits  ETHANOL  SALICYLATE LEVEL    Imaging Review No results found.   EKG Interpretation None      MDM   Final diagnoses:  Delusional thoughts  Schizoaffective disorder, unspecified type    Patient feels much better after Haldol and Ativan.  He appears much more calm.  He is medically clear at this time.  This likely uncontrolled mental health and noncompliance with medications as well as abuse of Adderall.  Patient is agreeable to voluntary stay at this time.  He is calm and cooperative.  No homicidal or suicidal thoughts.  No indication for involuntary commitment if he were to choose to return home at this time however he has received medications which is the only thing making him feel better at this time.    Azalia Bilis, MD 07/10/15 [phone]

## 2015-07-10 NOTE — ED Notes (Signed)
Pt reports hallucinations and panic attacks for years. Pt states "I need medical help." Pt reports auditory hallucinations but does not share additional information. Pt denies SI/HI.

## 2015-07-10 NOTE — ED Notes (Signed)
Pt BIB girlfriend.  Girlfriend states that pt is having hallucinations. Pt is very agitated in triage.  Pt denies hallucinations but reports high anxiety.

## 2015-07-10 NOTE — ED Notes (Signed)
He is markedly calmer and is lightly napping when I enter his room.  He smiles as he tells me he feels "better".

## 2015-07-10 NOTE — ED Notes (Signed)
Report received from [place] [facility].  Assumed care of the patient at this time.

## 2015-07-10 NOTE — BH Assessment (Signed)
300 bed currently unavailable at [facility]. Seeking placement.  Sent referral to: Belva Bertin, [address] [place], [place], [place] Triage Specialist 07/10/2015 10:55 PM

## 2015-07-11 DIAGNOSIS — F259 Schizoaffective disorder, unspecified: Secondary | ICD-10-CM | POA: Diagnosis not present

## 2015-07-11 DIAGNOSIS — F22 Delusional disorders: Secondary | ICD-10-CM | POA: Insufficient documentation

## 2015-07-11 MED ORDER — AMITRIPTYLINE HCL 25 MG PO TABS
50.0000 mg | ORAL_TABLET | Freq: Every day | ORAL | Status: DC
  Administered 2015-07-11: 50 mg via ORAL
  Filled 2015-07-11: qty 2

## 2015-07-11 NOTE — ED Notes (Signed)
Bed: [facility] Expected date:  Expected time:  Means of arrival:  Comments: Hold for 31

## 2015-07-11 NOTE — ED Notes (Signed)
Medications due at 2200 were held due to patient sleeping.

## 2015-07-11 NOTE — Progress Notes (Signed)
Pt seen by [facility] staff and given resources today  And  CM discussed and provided written information for uninsured accepting pcps, discussed the importance of pcp vs EDP services for f/u care, www.needymeds.org, www.goodrx.com, discounted pharmacies and other [place] resources such as [facility] , [facility], affordable care act, financial assistance, uninsured dental services, [place] med assist, DSS and  health department  Reviewed resources for [place] uninsured accepting pcps like [facility], [facility] [address], [facility], [facility], local urgent care centers, [facility], [facility], general medical clinics, [facility], [facility] plus others, medication resources, [facility] out patient pharmacies and housing Pt voiced understanding and appreciation of resources provided   

## 2015-07-11 NOTE — ED Notes (Signed)
Friend into see 

## 2015-07-11 NOTE — B[facility] Counselor (Signed)
Patient is pending review for possible placement with [facility] 

## 2015-07-11 NOTE — Consult Note (Signed)
[facility] Consult   Reason for Consult:   Schizoaffective disorder, Substance induced Psychosis Referring Physician:  EDP Patient Identification: Curtis Cobb MRN:  [id number] Principal Diagnosis: Schizoaffective disorder, unspecified type Diagnosis:   Patient Active Problem List   Diagnosis Date Noted  . Schizoaffective disorder, unspecified type [F25.9]     Priority: High  . Schizoaffective disorder [F25.9] 02/11/2014    Priority: High  . Alcohol dependence [F10.20] 02/13/2014  . Cannabis abuse [F12.10] 02/13/2014  . Bipolar disorder [F31.9] 02/11/2014    Total Time spent with patient: 45 minutes  Subjective:   Curtis Cobb is a 44 y.o. male patient admitted with  Schizoaffective disorder, Substance induced Psychosis  HPI:  Caucasian male 44 years old was evaluated for auditory hallucination and Paranoia.  Patient reports that he has been using up to 200 mg of Adderall prescribed by his PMD.  Patient was seen and discharged last month for same symptoms.  His UDS is positive for Amphetamine.   Patient came to the ER asking for help and stated that he is no longer going to take Adderall.  Patient denies SI/HI/AVH at this time.  He is calm and cooperative.  Patient is asking to be placed back on Elevil which helped him in the past.  He reports good sleep and appetite.  He has been accepted for admission and we will be looking for bed at any facility with available inpatient Psychiatric unit.  HPI Elements:   Location:  Substance induced Psychosis, Schizoaffective disorder. Quality:  severe. Severity:  severe. Timing:  Acute. Duration:  Chronic mental illness. Context:  Seeking detox off Adderall and seeking medication for mental illness..  Past Medical History:  Past Medical History  Diagnosis Date  . Bipolar 1 disorder   . Schizophrenia   . Hypertension   . Depression   . Anxiety   . Rheumatoid arthritis    No past surgical history on file. Family  History: No family history on file. Social History:  History  Alcohol Use No     History  Drug Use No    Comment: THC    History   Social History  . Marital Status: Single    Spouse Name: N/A  . Number of Children: N/A  . Years of Education: N/A   Social History Main Topics  . Smoking status: Former Smoker -- 0.00 packs/day for 0 years  . Smokeless tobacco: Not on file  . Alcohol Use: No  . Drug Use: No     Comment: THC  . Sexual Activity: Yes    Birth Control/ Protection: None   Other Topics Concern  . None   Social History Narrative   Additional Social History:    Pain Medications: Reports hx of abuse, reports no abuse in last 1-2 years Prescriptions: See PTA, reports taking more than prescribed adderall in last couple of weeks, reports she has been taking 200+  Over the Counter: See PTA History of alcohol / drug use?: Yes Longest period of sobriety (when/how long): 14 months for etoh, 2 years opioids, and 5-6 years for [facility]  Negative Consequences of Use: Personal relationships Withdrawal Symptoms:  (sweating ) Name of Substance 1: etoh  1 - Age of First Use: [age] 1 - Amount (size/oz): 1/5 to 1/2 gallon of vodka  1 - Frequency: daily  1 - Duration: years 1 - Last Use / Amount: reports no use since the last relapse a couple of weeks ago, 14 months sober except one  day  Name of Substance 2: Adderall 2 - Age of First Use: 2011 2 - Amount (size/oz): 200+mg  2 - Frequency: days at a time 2 - Duration: reports was absusing a couple of weeks ago, then short break in use,  2 - Last Use / Amount: 07-10-15, about 200 mg  Name of Substance 3: opioids 3 - Age of First Use: teens 3 - Amount (size/oz): 80-160 mg per day 3 - Frequency: daily  3 - Duration: on and off 3 - Last Use / Amount: 1-2 years ago  Name of Substance 4: THC  4 - Last Use / Amount: 5-6 years ago              Allergies:   Allergies  Allergen Reactions  . Sulfa Antibiotics Other (See Comments)     Unknown reaction    Labs:  Results for orders placed or performed during the hospital encounter of 07/10/15 (from the past 48 hour(s))  Urine rapid drug screen (hosp performed)     Status: Abnormal   Collection Time: 07/10/15  4:37 PM  Result Value Ref Range   Opiates NONE DETECTED NONE DETECTED   Cocaine NONE DETECTED NONE DETECTED   Benzodiazepines NONE DETECTED NONE DETECTED   Amphetamines POSITIVE (A) NONE DETECTED   Tetrahydrocannabinol NONE DETECTED NONE DETECTED   Barbiturates NONE DETECTED NONE DETECTED    Comment:        DRUG SCREEN FOR MEDICAL PURPOSES ONLY.  IF CONFIRMATION IS NEEDED FOR ANY PURPOSE, NOTIFY LAB WITHIN 5 DAYS.        LOWEST DETECTABLE LIMITS FOR URINE DRUG SCREEN Drug Class       Cutoff (ng/mL) Amphetamine      1000 Barbiturate      200 Benzodiazepine   694 Tricyclics       854 Opiates          300 Cocaine          300 THC              50   Comprehensive metabolic panel     Status: Abnormal   Collection Time: 07/10/15  4:39 PM  Result Value Ref Range   Sodium 139 135 - 145 mmol/L   Potassium 3.4 (L) 3.5 - 5.1 mmol/L   Chloride 104 101 - 111 mmol/L   CO2 19 (L) 22 - 32 mmol/L   Glucose, Bld 125 (H) 65 - 99 mg/dL   BUN 17 6 - 20 mg/dL   Creatinine, Ser 1.20 0.61 - 1.24 mg/dL   Calcium 9.9 8.9 - 10.3 mg/dL   Total Protein 7.9 6.5 - 8.1 g/dL   Albumin 4.6 3.5 - 5.0 g/dL   AST 38 15 - 41 U/L   ALT 47 17 - 63 U/L   Alkaline Phosphatase 50 38 - 126 U/L   Total Bilirubin 2.1 (H) 0.3 - 1.2 mg/dL   GFR calc non Af Amer >60 >60 mL/min   GFR calc Af Amer >60 >60 mL/min    Comment: (NOTE) The eGFR has been calculated using the CKD EPI equation. This calculation has not been validated in all clinical situations. eGFR's persistently <60 mL/min signify possible Chronic Kidney Disease.    Anion gap 16 (H) 5 - 15  CBC     Status: Abnormal   Collection Time: 07/10/15  4:39 PM  Result Value Ref Range   WBC 24.0 (H) 4.0 - 10.5 K/uL   RBC 5.31  4.22 - 5.81 MIL/uL  Hemoglobin 16.2 13.0 - 17.0 g/dL   HCT 46.4 39.0 - 52.0 %   MCV 87.4 78.0 - 100.0 fL   MCH 30.5 26.0 - 34.0 pg   MCHC 34.9 30.0 - 36.0 g/dL   RDW 14.1 11.5 - 15.5 %   Platelets 323 150 - 400 K/uL  Ethanol (ETOH)     Status: None   Collection Time: 07/10/15  4:41 PM  Result Value Ref Range   Alcohol, Ethyl (B) <5 <5 mg/dL    Comment:        LOWEST DETECTABLE LIMIT FOR SERUM ALCOHOL IS 5 mg/dL FOR MEDICAL PURPOSES ONLY   Salicylate level     Status: None   Collection Time: 07/10/15  4:41 PM  Result Value Ref Range   Salicylate Lvl <1.8 2.8 - 30.0 mg/dL  Lithium level     Status: Abnormal   Collection Time: 07/10/15  4:45 PM  Result Value Ref Range   Lithium Lvl <0.06 (L) 0.60 - 1.20 mmol/L    Vitals: Blood pressure 108/70, pulse 114, temperature 98.2 F (36.8 C), temperature source Oral, resp. rate 18, SpO2 96 %.  Risk to Self: Suicidal Ideation: No Suicidal Intent: No Is patient at risk for suicide?: No Suicidal Plan?: No Access to Means: No What has been your use of drugs/alcohol within the last 12 months?: hx of abusing THC, etoh, and pain pills in the past, currently abusing adderall How many times?: 3 Other Self Harm Risks: none Triggers for Past Attempts: Other (Comment) Intentional Self Injurious Behavior: None Risk to Others: Homicidal Ideation: No Thoughts of Harm to Others: No Current Homicidal Intent: No Current Homicidal Plan: No Access to Homicidal Means: No Identified Victim: none History of harm to others?: No Assessment of Violence: In distant past Violent Behavior Description: reports fighting in the past when he was still drinking  Does patient have access to weapons?: No Criminal Charges Pending?: No Does patient have a court date: No Prior Inpatient Therapy: Prior Inpatient Therapy: Yes Prior Therapy Dates: 02/2014, and 01/2014 Prior Therapy Facilty/Provider(s): [facility] Reason for Treatment: SA, psychosis  Prior Outpatient  Therapy: Prior Outpatient Therapy: Yes Prior Therapy Dates: current  Prior Therapy Facilty/Provider(s): Dr. Rosine Door medication management, attends [facility] Reason for Treatment: SA, depression, ADHD- medication management  Does patient have an ACCT team?: No Does patient have Intensive In-House Services?  : No Does patient have [facility] services? : No Does patient have P4CC services?: No  Current Facility-Administered Medications  Medication Dose Route Frequency Provider Last Rate Last Dose  . acetaminophen (TYLENOL) tablet 650 mg  650 mg Oral Q4H PRN Jola Schmidt, MD      . alum & mag hydroxide-simeth (MAALOX/MYLANTA) 200-200-20 MG/5ML suspension 30 mL  30 mL Oral PRN Jola Schmidt, MD      . amitriptyline (ELAVIL) tablet 50 mg  50 mg Oral QHS Shaunie Boehm      . carbamazepine (TEGRETOL XR) 12 hr tablet 200 mg  200 mg Oral BID Omari Mcmanaway   200 mg at 07/11/15 1018  . hydrOXYzine (ATARAX/VISTARIL) tablet 25 mg  25 mg Oral Q4H PRN Jola Schmidt, MD   25 mg at 07/11/15 1018  . ibuprofen (ADVIL,MOTRIN) tablet 600 mg  600 mg Oral Q8H PRN Jola Schmidt, MD      . ondansetron [facility]) tablet 4 mg  4 mg Oral Q8H PRN Jola Schmidt, MD      . traZODone (DESYREL) tablet 100 mg  100 mg Oral QHS PRN Jola Schmidt, MD  Current Outpatient Prescriptions  Medication Sig Dispense Refill  . amphetamine-dextroamphetamine (ADDERALL) 30 MG tablet Take 30 mg by mouth daily.    Marland Kitchen 5-Hydroxytryptophan (5-HTP PO) Take 1 capsule by mouth daily.    Marland Kitchen amitriptyline (ELAVIL) 25 MG tablet Take 1 tablet (25 mg total) by mouth at bedtime. For sleep/depression (Patient taking differently: Take 75 mg by mouth at bedtime as needed for sleep. For sleep/depression) 30 tablet 0  . carbamazepine (TEGRETOL XR) 200 MG 12 hr tablet Take 1 tablet (200 mg total) by mouth 2 (two) times daily. For mood stabilization (Patient not taking: Reported on 06/25/2015) 60 tablet 0  . hydrOXYzine (ATARAX/VISTARIL) 25 MG tablet Take 1 tablet (25 mg  total) by mouth every 4 (four) hours as needed for anxiety (Sleep). (Patient not taking: Reported on 06/25/2015) 120 tablet 0  . lithium carbonate (LITHOBID) 300 MG CR tablet Take 1 tablet (300 mg total) by mouth every 12 (twelve) hours. For mood stabilization (Patient not taking: Reported on 06/25/2015) 60 tablet 0  . predniSONE (DELTASONE) 20 MG tablet Take 20 mg by mouth daily with breakfast.    . traZODone (DESYREL) 100 MG tablet Take 1 tablet (100 mg total) by mouth at bedtime as needed for sleep. (Patient not taking: Reported on 06/25/2015) 30 tablet 0    Musculoskeletal: Strength & Muscle Tone: within normal limits Gait & Station: normal Patient leans: N/A  Psychiatric Specialty Exam: Physical Exam  Review of Systems  Constitutional: Negative.   HENT: Negative.   Eyes: Negative.   Respiratory: Negative.   Cardiovascular: Negative.   Gastrointestinal: Negative.   Musculoskeletal: Negative.   Neurological: Negative.   Endo/Heme/Allergies: Negative.     Blood pressure 108/70, pulse 114, temperature 98.2 F (36.8 C), temperature source Oral, resp. rate 18, SpO2 96 %.There is no weight on file to calculate BMI.  General Appearance: Casual and Fairly Groomed  Engineer, water::  Good  Speech:  Clear and Coherent and Normal Rate  Volume:  Normal  Mood:  Anxious  Affect:  Congruent  Thought Process:  Coherent, Goal Directed and Intact  Orientation:  Full (Time, Place, and Person)  Thought Content:  Delusions and Paranoid Ideation  Suicidal Thoughts:  No  Homicidal Thoughts:  No  Memory:  Immediate;   Good Recent;   Good Remote;   Good  Judgement:  Impaired  Insight:  Shallow  Psychomotor Activity:  Normal  Concentration:  Good  Recall:  NA  Fund of Knowledge:Poor  Language: Good  Akathisia:  NA  Handed:  Right  AIMS (if indicated):     Assets:  Desire for Improvement  ADL's:  Intact  Cognition: WNL  Sleep:      Medical Decision Making: Review of Psycho-Social Stressors  (1)  Treatment Plan Summary: Daily contact with patient to assess and evaluate symptoms and progress in treatment and Medication management  Plan:  Resume all home medications. Disposition: [facility]   PMHNP-BC 07/11/2015 3:59 PM Patient seen face-to-face for psychiatric evaluation, chart reviewed and case discussed with the physician extender and developed treatment plan. Reviewed the information documented and agree with the treatment plan. Corena Pilgrim, MD

## 2015-07-11 NOTE — ED Notes (Signed)
Pt resting at present, no distress noted, calm & cooperative, AAO x 3, monitoring for safety, Q 15 min checks in effect.

## 2015-07-11 NOTE — BH Assessment (Addendum)
[facility] Assessment Progress Note  The following facilities have been contacted to seek placement for this pt, with results as noted:  Beds available, information sent, decision pending:  [facility]   Declined:  [facility]   At capacity:  [facility]   Kouper Spinella, MA Triage Specialist [phone]     

## 2015-07-11 NOTE — ED Notes (Signed)
Patient awake and states he feels manic.  Patient given Ambien to help with rest.  Pt agreed to take lithium and tegretol as ordered.  7/13 2200 doses were not found in department - request sent to pharmacy for replacement for missing doses.

## 2015-07-12 ENCOUNTER — Inpatient Hospital Stay (HOSPITAL_COMMUNITY)
Admission: AD | Admit: 2015-07-12 | Discharge: 2015-07-16 | DRG: 885 | Disposition: A | Discharge status: Home / Self Care | Payer: Federal, State, Local not specified - Other | Source: Intra-hospital | Attending: Psychiatry | Admitting: Psychiatry

## 2015-07-12 ENCOUNTER — Encounter (HOSPITAL_COMMUNITY): Payer: Self-pay | Admitting: *Deleted

## 2015-07-12 DIAGNOSIS — F332 Major depressive disorder, recurrent severe without psychotic features: Secondary | ICD-10-CM | POA: Diagnosis not present

## 2015-07-12 DIAGNOSIS — F15151 Other stimulant abuse with stimulant-induced psychotic disorder with hallucinations: Secondary | ICD-10-CM | POA: Diagnosis present

## 2015-07-12 DIAGNOSIS — F900 Attention-deficit hyperactivity disorder, predominantly inattentive type: Secondary | ICD-10-CM | POA: Diagnosis not present

## 2015-07-12 DIAGNOSIS — I10 Essential (primary) hypertension: Secondary | ICD-10-CM | POA: Diagnosis present

## 2015-07-12 DIAGNOSIS — M069 Rheumatoid arthritis, unspecified: Secondary | ICD-10-CM | POA: Diagnosis present

## 2015-07-12 DIAGNOSIS — F909 Attention-deficit hyperactivity disorder, unspecified type: Secondary | ICD-10-CM | POA: Diagnosis present

## 2015-07-12 DIAGNOSIS — F258 Other schizoaffective disorders: Secondary | ICD-10-CM | POA: Diagnosis present

## 2015-07-12 DIAGNOSIS — F410 Panic disorder [episodic paroxysmal anxiety] without agoraphobia: Secondary | ICD-10-CM | POA: Diagnosis present

## 2015-07-12 DIAGNOSIS — G4700 Insomnia, unspecified: Secondary | ICD-10-CM | POA: Diagnosis present

## 2015-07-12 DIAGNOSIS — Z87891 Personal history of nicotine dependence: Secondary | ICD-10-CM | POA: Diagnosis not present

## 2015-07-12 DIAGNOSIS — F259 Schizoaffective disorder, unspecified: Secondary | ICD-10-CM | POA: Diagnosis present

## 2015-07-12 LAB — CBC
HEMATOCRIT: 42.1 % (ref 39.0–52.0)
HEMOGLOBIN: 14 g/dL (ref 13.0–17.0)
MCH: 29.9 pg (ref 26.0–34.0)
MCHC: 33.3 g/dL (ref 30.0–36.0)
MCV: 89.8 fL (ref 78.0–100.0)
Platelets: 216 10*3/uL (ref 150–400)
RBC: 4.69 MIL/uL (ref 4.22–5.81)
RDW: 14.1 % (ref 11.5–15.5)
WBC: 8.9 10*3/uL (ref 4.0–10.5)

## 2015-07-12 MED ORDER — MAGNESIUM HYDROXIDE 400 MG/5ML PO SUSP: 30.0000 mL | Freq: Every day | ORAL | Status: DC | PRN

## 2015-07-12 MED ORDER — CARBAMAZEPINE ER 200 MG PO TB12
200.0000 mg | ORAL_TABLET | Freq: Two times a day (BID) | ORAL | Status: DC
  Administered 2015-07-12 – 2015-07-13 (×2): 200 mg via ORAL
  Filled 2015-07-12 (×6): qty 1

## 2015-07-12 MED ORDER — HYDROXYZINE HCL 25 MG PO TABS
25.0000 mg | ORAL_TABLET | ORAL | Status: DC | PRN
  Administered 2015-07-14: 25 mg via ORAL
  Filled 2015-07-12: qty 1
  Filled 2015-07-12: qty 20

## 2015-07-12 MED ORDER — AMITRIPTYLINE HCL 50 MG PO TABS
50.0000 mg | ORAL_TABLET | Freq: Every day | ORAL | Status: DC
  Administered 2015-07-12 – 2015-07-15 (×4): 50 mg via ORAL
  Filled 2015-07-12: qty 14
  Filled 2015-07-12 (×3): qty 1
  Filled 2015-07-12: qty 2
  Filled 2015-07-12: qty 1
  Filled 2015-07-12: qty 14
  Filled 2015-07-12: qty 1
  Filled 2015-07-12: qty 14

## 2015-07-12 MED ORDER — ALUM & MAG HYDROXIDE-SIMETH 200-200-20 MG/5ML PO SUSP: 30.0000 mL | ORAL | Status: DC | PRN

## 2015-07-12 MED ORDER — ACETAMINOPHEN 325 MG PO TABS: 650.0000 mg | ORAL_TABLET | Freq: Four times a day (QID) | ORAL | Status: DC | PRN

## 2015-07-12 MED ORDER — TRAZODONE HCL 100 MG PO TABS: 100.0000 mg | ORAL_TABLET | Freq: Every evening | ORAL | Status: DC | PRN

## 2015-07-12 NOTE — Progress Notes (Signed)
Attended AA group 

## 2015-07-12 NOTE — Tx Team (Signed)
Initial Interdisciplinary Treatment Plan   PATIENT STRESSORS: Financial difficulties Health problems Marital or family conflict Occupational concerns Substance abuse   PATIENT STRENGTHS: Ability for insight Active sense of humor Average or above average intelligence Communication skills General fund of knowledge Motivation for treatment/growth Special hobby/interest   PROBLEM LIST: Problem List/Patient Goals Date to be addressed Date deferred Reason deferred Estimated date of resolution  "I want help with getting my sobriety back" 07/12/15     "I would also like to have therapy to help me deal with stress and help me make amends with my (estranged) family." 07/12/15                                                 DISCHARGE CRITERIA:  Ability to meet basic life and health needs Adequate post-discharge living arrangements Improved stabilization in mood, thinking, and/or behavior Medical problems require only outpatient monitoring Need for constant or close observation no longer present Reduction of life-threatening or endangering symptoms to within safe limits  PRELIMINARY DISCHARGE PLAN: Outpatient therapy Participate in family therapy Return to previous living arrangement Return to previous work or school arrangements  PATIENT/FAMIILY INVOLVEMENT: This treatment plan has been presented to and reviewed with the patient, Curtis Cobb.  The patient and family have been given the opportunity to ask questions and make suggestions.  [name] 07/12/2015, 8:11 PM

## 2015-07-12 NOTE — Progress Notes (Signed)
D: Patient asleep in bed upon entering room. He immediately woke up when I greeted him. Very cheerful and pleasant. We had a positive conversation regarding his day. States he had a good day. Denies any pain/SI/HI. A: We discussed him participating in group tonight and starting to meet his goals. R: Continue to monitor for safety. Encouragement and support given.

## 2015-07-12 NOTE — ED Notes (Signed)
Phlebotomy here to draw CBC

## 2015-07-12 NOTE — ED Notes (Signed)
Pt denies SI HI or AVH.  He c/o anxiety and shakiness,  He is visibly trembling.  15 minute checks in place and prn med given

## 2015-07-12 NOTE — BH Assessment (Signed)
[facility] Assessment Progress Note  Per Thedore Mins, MD, this pt requires psychiatric hospitalization at this time.  Per Candy Sledge, Berneice Heinrich, RN, [facility] has assigned pt to [facility] Rm [id number].  Pt has signed Voluntary Admission and Consent for Treatment, as well as Consent to Release Information, and signed forms have been faxed to [facility].  Pt's nurse, Kendal Hymen, has been notified, and agrees to send original paperwork along with pt via Juel Burrow, and to call report to [phone].  Doylene Canning, MA Triage Specialist [phone]

## 2015-07-12 NOTE — Progress Notes (Signed)
Nursing Admission note: 1330  D: Pt is a 44 year old male admitted from the ED with initial problem fever and hallucinations.  Upon arrival to [facility], pt is alert and oriented, pleasant and cooperative.  Pt stated that he had been recently diagnosed with Rheumatoid Arthritis which has dramatically impacted his life.  He states that he had been taking prednisone 20 mg daily and had stopped between 48-72 hours prior when he "got high" on his adderall.  He denies any hallucinations at this time stating "I've only heard voices or seen things other people can't when I've been using drugs."  He states that his family has "given up" on him and he wants to regain his sobriety (14 months; pta).  He has attempted previously to slash his forearm in a suicidal gesture.   A: Pt searched and admitted to the unit per routine.  Level 3 checks initiated and maintained.    R: Pt receptive to interventions.  Safety maintained.

## 2015-07-13 DIAGNOSIS — F900 Attention-deficit hyperactivity disorder, predominantly inattentive type: Secondary | ICD-10-CM

## 2015-07-13 MED ORDER — PREDNISONE 20 MG PO TABS
20.0000 mg | ORAL_TABLET | Freq: Every day | ORAL | Status: DC
  Filled 2015-07-13: qty 1

## 2015-07-13 MED ORDER — ZOLPIDEM TARTRATE 10 MG PO TABS
10.0000 mg | ORAL_TABLET | Freq: Every evening | ORAL | Status: DC | PRN
  Administered 2015-07-13 – 2015-07-14 (×2): 10 mg via ORAL
  Filled 2015-07-13 (×2): qty 1

## 2015-07-13 MED ORDER — PREDNISONE 20 MG PO TABS
20.0000 mg | ORAL_TABLET | Freq: Every day | ORAL | Status: DC
  Administered 2015-07-13 – 2015-07-16 (×4): 20 mg via ORAL
  Filled 2015-07-13 (×5): qty 1
  Filled 2015-07-13: qty 14

## 2015-07-13 NOTE — H&P (Signed)
Psychiatric Admission Assessment Adult  Patient Identification: Curtis Cobb MRN:  [id number] Date of Evaluation:  07/13/2015 Chief Complaint:  SCHIZOAFFECTIVE DISORDER Principal Diagnosis: <principal problem not specified> Diagnosis:   Patient Active Problem List   Diagnosis Date Noted  . Schizoaffective disorder-chronic with exacerbation [F25.8] 07/12/2015  . Delusional thoughts [F22]   . Schizoaffective disorder, unspecified type [F25.9]   . Alcohol dependence [F10.20] 02/13/2014  . Cannabis abuse [F12.10] 02/13/2014  . Bipolar disorder [F31.9] 02/11/2014  . Schizoaffective disorder [F25.9] 02/11/2014   History of Present Illness:: 44 Y/o male who states he  was here in 03-03-23. had lost his job, did not have a place to live. States he was diagnosed with rheumathoid arthritis shortly after he left here. States he has tremendous amounts of pain. States he is being treated but does not have money for the bilogics. He was given  prednisone. Initially he responded well. lttile by little the effectiveness had decreased. States that he was overwhelmed having auditory hallucinations. He was overusing the Adderall up to 180 mg. He was admitted. He was also drinking. Within the last month states he has been drinking a fith. States the prednisone is having side effect. Taking 20 mgs of Prednisone up to  6 days ago. States his mood is going down he is anxious. Mother died in Mar 03, 2011 still weights heavy on his heart. Had relapsed June 15. He was living the [employer] and he quit working them. He states needs the Adderall as prescribed to function. Admits that the increased pain coming from the arthritis is affecting him The initial assessment at the ED is as follows: Curtis Cobb is an 44 y.o. male. Returns to ED with complaints of auditory hallucinations. Pt is alert and oriented times 4. He denies SI, HI, or self-harm. He reports he has been abusing adderrall again for the past couple of weeks. He reports  when he slowed down amphetamine use his AH declined. He reports sx increased again with adderrall abuse. He reports he has been taking 200+ mg per day, denies use of other drugs or alcohol He reports he has not been taking his mental health medications. Reports increased depression and anxiety, not eating or sleeping, not being able to go to work. He reports AH narrating what he is doing claiming he wants to harm his SO and that the voices are going to set him up. "I came to get help, I don't know what to do." When presented on 06-25-15 he was discharged by psychiatry to follow up with OP resources. Pt has not done this. Denies any other changes since last visit. Pt could not recall outcome of last visit.   Elements:  Location:  depression anxiety pain stimulant abuse. Quality:  has been abusing the Adderall having auditory hallucinations trying to get disconnectd from his pain . Severity:  severe. Timing:  every day. Duration:  last 2 weeks . Context:  diagnosed with RA has pain increasingly more depressed abusing the Adderall to get some relief of the depression but starts hallucinating. Associated Signs/Symptoms: Depression Symptoms:  depressed mood, anhedonia, insomnia, fatigue, difficulty concentrating, anxiety, panic attacks, loss of energy/fatigue, weight loss, decreased appetite, (Hypo) Manic Symptoms:  Labiality of Mood, Anxiety Symptoms:  Excessive Worry, Panic Symptoms, Psychotic Symptoms:  Hallucinations and paranoia after he took Adderall 180 mg PTSD Symptoms: Had a traumatic exposure:  abuse Total Time spent with patient: 45 minutes  Past Medical History:  Past Medical History  Diagnosis Date  . Bipolar  1 disorder   . Schizophrenia   . Depression   . Anxiety   . Rheumatoid arthritis   . Hypertension    History reviewed. No pertinent past surgical history. Family History: History reviewed. No pertinent family history.  Mother side of family depression, mother  grandmother also anxiety.  Social History:  History  Alcohol Use No     History  Drug Use  . Yes  . Special: Amphetamines    Comment: THC    History   Social History  . Marital Status: Single    Spouse Name: N/A  . Number of Children: N/A  . Years of Education: N/A   Social History Main Topics  . Smoking status: Former Smoker -- 0.00 packs/day for 0 years  . Smokeless tobacco: Not on file  . Alcohol Use: No  . Drug Use: Yes    Special: Amphetamines     Comment: THC  . Sexual Activity: Yes    Birth Control/ Protection: None   Other Topics Concern  . None   Social History Narrative  lives in [place] GF, relationship has been great until he relapsed, June 16. He is not working right now. Does construction remodeling. 10 th grade was depressed a lot easily frustrated acting out. States he was diagnosed with ADHD 2012. Was placed on Adderall 30 BID. No kids.  Additional Social History:                          Musculoskeletal: Strength & Muscle Tone: within normal limits Gait & Station: normal Patient leans: normal  Psychiatric Specialty Exam: Physical Exam  Review of Systems  Constitutional: Positive for weight loss and malaise/fatigue.  Eyes: Negative.   Respiratory: Positive for shortness of breath.   Cardiovascular: Positive for palpitations.  Gastrointestinal: Negative.   Genitourinary: Negative.   Musculoskeletal: Positive for joint pain and neck pain.  Skin: Positive for rash.  Neurological: Positive for dizziness and weakness.  Endo/Heme/Allergies: Negative.   Psychiatric/Behavioral: Positive for depression, hallucinations and substance abuse. The patient is nervous/anxious.     Blood pressure 113/84, pulse 101, temperature 98.7 F (37.1 C), temperature source Oral, resp. rate 16, height 6' 1.62" (1.87 m), weight 84.006 kg (185 lb 3.2 oz), SpO2 100 %.Body mass index is 24.02 kg/(m^2).  General Appearance: Fairly Groomed  Chemical engineer::  Fair  Speech:  Clear and Coherent  Volume:  fluctuates  Mood:  Anxious, Depressed and Dysphoric  Affect:  Restricted  Thought Process:  Coherent and Goal Directed  Orientation:  Full (Time, Place, and Person)  Thought Content:  symptoms events worries concerns  Suicidal Thoughts:  No  Homicidal Thoughts:  No  Memory:  Immediate;   Fair Recent;   Fair Remote;   Fair  Judgement:  Fair  Insight:  Present and Shallow  Psychomotor Activity:  Restlessness  Concentration:  Fair  Recall:  [place]: Fair  Akathisia:  No  Handed:  Right  AIMS (if indicated):     Assets:  Desire for Improvement  ADL's:  Intact  Cognition: WNL  Sleep:      Risk to Self: Is patient at risk for suicide?: No Risk to Others:   Prior Inpatient Therapy:  [facility], [facility] March 2015 till June 07 2014 Prior Outpatient Therapy:  not currently  Alcohol Screening: 1. How often do you have a drink containing alcohol?: Monthly or less 2. How many drinks containing  alcohol do you have on a typical day when you are drinking?: 10 or more 3. How often do you have six or more drinks on one occasion?: Monthly Preliminary Score: 6 4. How often during the last year have you found that you were not able to stop drinking once you had started?: Never 6. How often during the last year have you needed a first drink in the morning to get yourself going after a heavy drinking session?: Never 7. How often during the last year have you had a feeling of guilt of remorse after drinking?: Monthly 8. How often during the last year have you been unable to remember what happened the night before because you had been drinking?: Never 9. Have you or someone else been injured as a result of your drinking?: Yes, but not in the last year 10. Has a relative or friend or a doctor or another health worker been concerned about your drinking or suggested you cut down?: Yes, but not in the last  year Alcohol Use Disorder Identification Test Final Score (AUDIT): 13 Brief Intervention: Yes  Allergies:   Allergies  Allergen Reactions  . Sulfa Antibiotics Other (See Comments)    Unknown reaction   Lab Results:  Results for orders placed or performed during the hospital encounter of 07/10/15 (from the past 48 hour(s))  CBC     Status: None   Collection Time: 07/12/15 11:08 AM  Result Value Ref Range   WBC 8.9 4.0 - 10.5 K/uL   RBC 4.69 4.22 - 5.81 MIL/uL   Hemoglobin 14.0 13.0 - 17.0 g/dL   HCT 42.1 39.0 - 52.0 %   MCV 89.8 78.0 - 100.0 fL   MCH 29.9 26.0 - 34.0 pg   MCHC 33.3 30.0 - 36.0 g/dL   RDW 14.1 11.5 - 15.5 %   Platelets 216 150 - 400 K/uL   Current Medications: Current Facility-Administered Medications  Medication Dose Route Frequency Provider Last Rate Last Dose  . acetaminophen (TYLENOL) tablet 650 mg  650 mg Oral Q6H PRN Patrecia Pour, NP      . alum & mag hydroxide-simeth (MAALOX/MYLANTA) 200-200-20 MG/5ML suspension 30 mL  30 mL Oral Q4H PRN Patrecia Pour, NP      . amitriptyline (ELAVIL) tablet 50 mg  50 mg Oral QHS Patrecia Pour, NP   50 mg at 07/12/15 2118  . carbamazepine (TEGRETOL XR) 12 hr tablet 200 mg  200 mg Oral BID Patrecia Pour, NP   200 mg at 07/13/15 4098  . hydrOXYzine (ATARAX/VISTARIL) tablet 25 mg  25 mg Oral Q4H PRN Patrecia Pour, NP      . magnesium hydroxide (MILK OF MAGNESIA) suspension 30 mL  30 mL Oral Daily PRN Patrecia Pour, NP      . traZODone (DESYREL) tablet 100 mg  100 mg Oral QHS PRN Patrecia Pour, NP       PTA Medications: Prescriptions prior to admission  Medication Sig Dispense Refill Last Dose  . amitriptyline (ELAVIL) 25 MG tablet Take 1 tablet (25 mg total) by mouth at bedtime. For sleep/depression (Patient taking differently: Take 75 mg by mouth at bedtime as needed for sleep. For sleep/depression) 30 tablet 0 unknown at unknown time  . carbamazepine (TEGRETOL XR) 200 MG 12 hr tablet Take 1 tablet (200 mg total) by  mouth 2 (two) times daily. For mood stabilization (Patient not taking: Reported on 06/25/2015) 60 tablet 0 Not Taking at Unknown time  .  hydrOXYzine (ATARAX/VISTARIL) 25 MG tablet Take 1 tablet (25 mg total) by mouth every 4 (four) hours as needed for anxiety (Sleep). (Patient not taking: Reported on 06/25/2015) 120 tablet 0 Not Taking at Unknown time  . lithium carbonate (LITHOBID) 300 MG CR tablet Take 1 tablet (300 mg total) by mouth every 12 (twelve) hours. For mood stabilization (Patient not taking: Reported on 06/25/2015) 60 tablet 0 Not Taking at Unknown time  . traZODone (DESYREL) 100 MG tablet Take 1 tablet (100 mg total) by mouth at bedtime as needed for sleep. (Patient not taking: Reported on 06/25/2015) 30 tablet 0 Not Taking at Unknown time    Previous Psychotropic Medications: Yes Adderall tried Strattera, Intuniv Paxil ( numb) Elavil. Lithium  (akathisia, restless legs) , Zoloft ( irritable)   Substance Abuse History in the last 12 months:  Yes.      Consequences of Substance Abuse: withdrawal symptoms; tremors   Results for orders placed or performed during the hospital encounter of 07/10/15 (from the past 72 hour(s))  Urine rapid drug screen (hosp performed)     Status: Abnormal   Collection Time: 07/10/15  4:37 PM  Result Value Ref Range   Opiates NONE DETECTED NONE DETECTED   Cocaine NONE DETECTED NONE DETECTED   Benzodiazepines NONE DETECTED NONE DETECTED   Amphetamines POSITIVE (A) NONE DETECTED   Tetrahydrocannabinol NONE DETECTED NONE DETECTED   Barbiturates NONE DETECTED NONE DETECTED    Comment:        DRUG SCREEN FOR MEDICAL PURPOSES ONLY.  IF CONFIRMATION IS NEEDED FOR ANY PURPOSE, NOTIFY LAB WITHIN 5 DAYS.        LOWEST DETECTABLE LIMITS FOR URINE DRUG SCREEN Drug Class       Cutoff (ng/mL) Amphetamine      1000 Barbiturate      200 Benzodiazepine   469 Tricyclics       629 Opiates          300 Cocaine          300 THC              50   Comprehensive  metabolic panel     Status: Abnormal   Collection Time: 07/10/15  4:39 PM  Result Value Ref Range   Sodium 139 135 - 145 mmol/L   Potassium 3.4 (L) 3.5 - 5.1 mmol/L   Chloride 104 101 - 111 mmol/L   CO2 19 (L) 22 - 32 mmol/L   Glucose, Bld 125 (H) 65 - 99 mg/dL   BUN 17 6 - 20 mg/dL   Creatinine, Ser 1.20 0.61 - 1.24 mg/dL   Calcium 9.9 8.9 - 10.3 mg/dL   Total Protein 7.9 6.5 - 8.1 g/dL   Albumin 4.6 3.5 - 5.0 g/dL   AST 38 15 - 41 U/L   ALT 47 17 - 63 U/L   Alkaline Phosphatase 50 38 - 126 U/L   Total Bilirubin 2.1 (H) 0.3 - 1.2 mg/dL   GFR calc non Af Amer >60 >60 mL/min   GFR calc Af Amer >60 >60 mL/min    Comment: (NOTE) The eGFR has been calculated using the CKD EPI equation. This calculation has not been validated in all clinical situations. eGFR's persistently <60 mL/min signify possible Chronic Kidney Disease.    Anion gap 16 (H) 5 - 15  CBC     Status: Abnormal   Collection Time: 07/10/15  4:39 PM  Result Value Ref Range   WBC 24.0 (H) 4.0 - 10.5 K/uL  RBC 5.31 4.22 - 5.81 MIL/uL   Hemoglobin 16.2 13.0 - 17.0 g/dL   HCT 46.4 39.0 - 52.0 %   MCV 87.4 78.0 - 100.0 fL   MCH 30.5 26.0 - 34.0 pg   MCHC 34.9 30.0 - 36.0 g/dL   RDW 14.1 11.5 - 15.5 %   Platelets 323 150 - 400 K/uL  Ethanol (ETOH)     Status: None   Collection Time: 07/10/15  4:41 PM  Result Value Ref Range   Alcohol, Ethyl (B) <5 <5 mg/dL    Comment:        LOWEST DETECTABLE LIMIT FOR SERUM ALCOHOL IS 5 mg/dL FOR MEDICAL PURPOSES ONLY   Salicylate level     Status: None   Collection Time: 07/10/15  4:41 PM  Result Value Ref Range   Salicylate Lvl <1.6 2.8 - 30.0 mg/dL  Lithium level     Status: Abnormal   Collection Time: 07/10/15  4:45 PM  Result Value Ref Range   Lithium Lvl <0.06 (L) 0.60 - 1.20 mmol/L  CBC     Status: None   Collection Time: 07/12/15 11:08 AM  Result Value Ref Range   WBC 8.9 4.0 - 10.5 K/uL   RBC 4.69 4.22 - 5.81 MIL/uL   Hemoglobin 14.0 13.0 - 17.0 g/dL   HCT  42.1 39.0 - 52.0 %   MCV 89.8 78.0 - 100.0 fL   MCH 29.9 26.0 - 34.0 pg   MCHC 33.3 30.0 - 36.0 g/dL   RDW 14.1 11.5 - 15.5 %   Platelets 216 150 - 400 K/uL    Observation Level/Precautions:  15 minute checks  Laboratory:  As per the ED  Psychotherapy:  Individual/group  Medications:  Will resume his steroids, will use Ambien for sleep and reassess medications for his mood  Consultations:    Discharge Concerns:    Estimated LOS: 3-5 day  Other:     Psychological Evaluations: No   Treatment Plan Summary: Daily contact with patient to assess and evaluate symptoms and progress in treatment and Medication management Supportive approach/coping skills Depression; will reassess his antidepressant regime, consider augmentation Pain; will resume his Prednisone ADHD; will explore alternatives to the Adderall Use CBT/mindfulness Medical Decision Making:  Review of Psycho-Social Stressors (1), Review or order clinical lab tests (1), Review of Medication Regimen & Side Effects (2) and Review of New Medication or Change in Dosage (2)  I certify that inpatient services furnished can reasonably be expected to improve the patient's condition.   Preeya Cleckley A 7/16/201610:35 AM

## 2015-07-13 NOTE — Progress Notes (Signed)
D) Pt has been attending the groups, partisipating and interacting with his peers. Affect and mood are appropriate. Pt denies SI and HI. Rates his depression at a 5, hopelessness at a 4 and his anxiety at a 6. Pt upset this morning because he states he has been taking his prednisone 20 mg for over a year and it was not ordered for him. "I don't want to start the pattern of pain,. I hurt terribly when I don't have it".  A) Given support and reassurance along with praise and encouragement. Provided with a 1:1. Encouragement given. R) Denies SI and HI.

## 2015-07-13 NOTE — [facility] Group Notes (Signed)
[name] Group Therapy  07/13/2015  1:15 PM  Type of Therapy:  Group Therapy  Participation Level:  Minimal  Affect:  Flat  Cognitive:  Alert  Insight:  None shared  Engagement in Therapy:  Limited  Modes of Intervention:  Clarification, Limit-setting, Problem-solving, Socialization and Support  Summary of Progress/Problems: Summary of Progress/Problems: The main focus of today's process group was to learn how to use a decisional balance exercise to move forward in the Stages of Change, which were described and discussed. Motivational Interviewing and a worksheet were utilized to help patients explore in depth the perceived benefits and costs of unhealthy coping techniques, as well as the benefits and costs of replacing that with a healthy coping skills. Patient was present only for first few minutes of group before being called out to met with medical staff member.    Curtis Cobb

## 2015-07-13 NOTE — Progress Notes (Signed)
Adult Psychoeducational Group Note  Date:  07/13/2015 Time:  9:11 PM  Group Topic/Focus:  Wrap-Up Group:   The focus of this group is to help patients review their daily goal of treatment and discuss progress on daily workbooks.  Participation Level:  Active  Participation Quality:  Appropriate and Attentive  Affect:  Appropriate  Cognitive:  Appropriate  Insight: Appropriate  Engagement in Group:  Engaged  Modes of Intervention:  Discussion  Additional Comments:  Pt stated his goal for today was to just get through the day as he was having really bad cravings. Pt stated he was able to talk with some people which really helped him out. Pt stated he needs to learn to be patient with hisself. Pt stated his goal is to take things day by day.  Caswell Corwin 07/13/2015, 9:11 PM

## 2015-07-13 NOTE — [facility] Suicide Risk Assessment (Signed)
[facility] INPATIENT:  Family/Significant Other Suicide Prevention Education  Suicide Prevention Education:  Patient Refusal for Family/Significant Other Suicide Prevention Education: The patient Curtis Cobb has refused to provide written consent for family/significant other to be provided Family/Significant Other Suicide Prevention Education during admission and/or prior to discharge.  Physician notified.  Sarina Ser 07/13/2015, 3:49 PM

## 2015-07-13 NOTE — [facility] Suicide Risk Assessment (Signed)
[facility] Admission Suicide Risk Assessment   Nursing information obtained from:  Patient, Review of record Demographic factors:  Male, Caucasian, Low socioeconomic status, Unemployed Current Mental Status:  NA Loss Factors:  Decrease in vocational status, Loss of significant relationship, Decline in physical health, Financial problems / change in socioeconomic status Historical Factors:  Prior suicide attempts, Family history of mental illness or substance abuse Risk Reduction Factors:  Living with another person, especially a relative, Positive social support, Positive coping skills or problem solving skills Total Time spent with patient: 45 minutes Principal Problem: <principal problem not specified> Diagnosis:   Patient Active Problem List   Diagnosis Date Noted  . Schizoaffective disorder-chronic with exacerbation [F25.8] 07/12/2015  . Delusional thoughts [F22]   . Schizoaffective disorder, unspecified type [F25.9]   . Alcohol dependence [F10.20] 02/13/2014  . Cannabis abuse [F12.10] 02/13/2014  . Bipolar disorder [F31.9] 02/11/2014  . Schizoaffective disorder [F25.9] 02/11/2014     Continued Clinical Symptoms:  Alcohol Use Disorder Identification Test Final Score (AUDIT): 13 The "Alcohol Use Disorders Identification Test", Guidelines for Use in Primary Care, Second Edition.  World Science writer [facility]). Score between 0-7:  no or low risk or alcohol related problems. Score between 8-15:  moderate risk of alcohol related problems. Score between 16-19:  high risk of alcohol related problems. Score 20 or above:  warrants further diagnostic evaluation for alcohol dependence and treatment.   CLINICAL FACTORS:   Depression:   Impulsivity Severe Alcohol/Substance Abuse/Dependencies  Psychiatric Specialty Exam: Physical Exam  ROS  Blood pressure 113/84, pulse 101, temperature 98.7 F (37.1 C), temperature source Oral, resp. rate 16, height 6' 1.62" (1.87 m), weight 84.006 kg (185  lb 3.2 oz), SpO2 100 %.Body mass index is 24.02 kg/(m^2).   COGNITIVE FEATURES THAT CONTRIBUTE TO RISK:  Closed-mindedness, Polarized thinking and Thought constriction (tunnel vision)   Rheumatoid arthrities. Has dealt with depression bevore SUICIDE RISK:   Moderate:  Frequent suicidal ideation with limited intensity, and duration, some specificity in terms of plans, no associated intent, good self-control, limited dysphoria/symptomatology, some risk factors present, and identifiable protective factors, including available and accessible social support.  PLAN OF CARE: Supportive approach/coping skills                               Stimulant abuse; work on finding different options to treat his ADHD                               Depression; optimize treatment with antidepressants                                RA optimize pain management, resume the Prednisone                               CBT/mindfulness  Medical Decision Making:  Review of Psycho-Social Stressors (1), Review or order clinical lab tests (1), Review of Medication Regimen & Side Effects (2) and Review of New Medication or Change in Dosage (2)  I certify that inpatient services furnished can reasonably be expected to improve the patient's condition.   Swetha Rayle A 07/13/2015, 10:33 AM

## 2015-07-13 NOTE — [facility] Group Notes (Signed)
[facility] Group Notes:  (Nursing/MHT/Case Management/Adjunct)  Date:  07/13/2015  Time:  10:21 AM  Type of Therapy:  Goal Setting The group focuses on educating patients how to set healthy golas as well as identify tools needed to accomplish these goals.  Participation Level:  Active  Participation Quality:  Appropriate  Affect:  Anxious  Cognitive:  Appropriate  Insight:  Appropriate  Engagement in Group:  Engaged  Modes of Intervention:  Education  Summary of Progress/Problems:  Curtis Cobb 07/13/2015, 10:21 AM
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
## 2015-07-13 NOTE — Progress Notes (Signed)
D:Patien t in bed awake on approach.  Patient states he had a good day but states he is having cravings to use drugs.  Patient states he has been deep breathing and trying to keep himself busy to help with the cravings.  Patient denies SI/HI and denies AVH.  Patient brightens and is pleasant when speaking with Clinical research associate. A: Staff to monitor Q 15 mins for safety.  Encouragement and support offered.  Scheduled medications administered per orders.  Ambien administered prn for sleep. R: Patient remains safe on the unit.  Patient attended group tonight.  Patient visible on the unit briefly tonight.  Patient taking administered medications.

## 2015-07-13 NOTE — Progress Notes (Signed)
Psychoeducational Group Note  Date: 07/13/2015 Time: 1015  Group Topic/Focus:  Identifying Needs:   The focus of this group is to help patients identify their personal needs that have been historically problematic and identify healthy behaviors to address their needs.  Participation Level:  Patient did not attend.  Participation Quality:N/A    Affect:  N/A  Cognitive:  N/A  Insight:  N/A  Engagement in Group:  N/A  Additional Comments:    7/16/20161:56 PM Fabian Walder, Joie Bimler

## 2015-07-14 MED ORDER — QUETIAPINE FUMARATE 25 MG PO TABS
ORAL_TABLET | ORAL | Status: AC
  Administered 2015-07-14: 25 mg
  Filled 2015-07-14: qty 1

## 2015-07-14 MED ORDER — ENSURE ENLIVE PO LIQD
237.0000 mL | Freq: Two times a day (BID) | ORAL | Status: DC
  Administered 2015-07-14 – 2015-07-16 (×5): 237 mL via ORAL

## 2015-07-14 MED ORDER — QUETIAPINE FUMARATE 25 MG PO TABS
25.0000 mg | ORAL_TABLET | Freq: Three times a day (TID) | ORAL | Status: DC
  Filled 2015-07-14 (×2): qty 1

## 2015-07-14 MED ORDER — QUETIAPINE FUMARATE 50 MG PO TABS
50.0000 mg | ORAL_TABLET | Freq: Three times a day (TID) | ORAL | Status: DC
  Administered 2015-07-14 – 2015-07-16 (×6): 50 mg via ORAL
  Filled 2015-07-14 (×2): qty 1
  Filled 2015-07-14: qty 42
  Filled 2015-07-14 (×2): qty 1
  Filled 2015-07-14: qty 42
  Filled 2015-07-14 (×3): qty 1
  Filled 2015-07-14: qty 42
  Filled 2015-07-14 (×4): qty 1

## 2015-07-14 NOTE — [facility] Group Notes (Signed)
[facility] LCSW Group Therapy  07/14/2015   1:15 PM  Type of Therapy:  Group Therapy  Participation Level:  Active  Participation Quality:  Appropriate and Sharing  Affect:  Anxious  Cognitive:  Alert and Oriented  Insight:  Improving  Engagement in Therapy:  Engaged  Modes of Intervention:  Discussion, Exploration, Problem-solving, Socialization and Support   Summary of Progress/Problems: The main focus of today's process group was to identify the patient's current support system and decide on other supports that can be put in place. An emphasis was placed on using counselor, doctor, therapy groups, 12-step groups, and problem-specific support groups to expand supports. There was also an extensive discussion about possible fears related to success. Patient engaged easily in discussion regarding 'fear of success verses expecting failure' as 'failure can almost be comfortable.' Pt described "failure always happens when my emotions become overwhelming" and was hesitant at first to consider the possibility that this time can be different. Pt was able to process high cost of expecting failure.    Curtis Cobb

## 2015-07-14 NOTE — Progress Notes (Signed)
Adult Psychoeducational Group Note  Date:  07/14/2015 Time:  9:30 PM  Group Topic/Focus:  Wrap-Up Group:   The focus of this group is to help patients review their daily goal of treatment and discuss progress on daily workbooks.  Participation Level:  Active  Participation Quality:  Appropriate and Attentive  Affect:  Appropriate  Cognitive:  Appropriate  Insight: Appropriate  Engagement in Group:  Engaged  Modes of Intervention:  Discussion  Additional Comments:  Pt stated his goal for today was to make it through the day. Pt stated he is working on maintaining his sanity and order in regards to his sobriety because he knows it is still early in his recovery and he needs to stay focused on that. Pt stated tomorrow he wants to work on having more composure and commitment to his recovery.  Curtis Cobb 07/14/2015, 9:30 PM

## 2015-07-14 NOTE — Progress Notes (Signed)
Pt reports he is doing better this evening compared to this morning.  He says he is still having some mild withdrawal symptoms.  He denies SI/HI/AVH.  He reports he wants to stay clean because he is tired of living this way.  His only request was to receive Ambien for sleep tonight.  Discharge plans are still in process.  Support and encouragement offered.  Pt makes his needs known to staff.  Safety maintained with q15 minute checks.

## 2015-07-14 NOTE — [facility] Group Notes (Signed)
[facility] Group Notes:  (Nursing/MHT/Case Management/Adjunct)  Date:  07/14/2015  Time:  1030  Type of Therapy:  Life Skills  Participation Level:  good  Participation Quality:  good  Affect:  flat  Cognitive:  intact  Insightfair:    Engagement in Group:  engaged  Modes of Intervention:    Summary of Progress/Problems:  Curtis Cobb 07/14/2015, 2:33 PM

## 2015-07-14 NOTE — Progress Notes (Signed)
NUTRITION ASSESSMENT  Pt identified as at risk on the Malnutrition Screen Tool  INTERVENTION: 1. Supplements: Ensure Enlive po BID, each supplement provides 350 kcal and 20 grams of protein   NUTRITION DIAGNOSIS: Unintentional weight loss related to sub-optimal intake as evidenced by pt report.   Goal: Pt to meet >/= 90% of their estimated nutrition needs.  Monitor:  PO intake  Assessment:  Pt admitted with depression and amphetamine use.  Per malnutrition screen, pt states he has lost weight d/t not exercising. Per H&P, pt reports not eating d/t depression. Pt has also been drinking alcohol.   Height: Ht Readings from Last 1 Encounters:  07/12/15 6' 1.62" (1.87 m)    Weight: Wt Readings from Last 1 Encounters:  07/12/15 185 lb 3.2 oz (84.006 kg)    Weight Hx: Wt Readings from Last 10 Encounters:  07/12/15 185 lb 3.2 oz (84.006 kg)  03/17/14 194 lb (87.998 kg)  02/12/14 192 lb (87.091 kg)    BMI:  Body mass index is 24.02 kg/(m^2). Pt meets criteria for normal range based on current BMI.  Estimated Nutritional Needs: Kcal: 25-30 kcal/kg Protein: > 1 gram protein/kg Fluid: 1 ml/kcal  Diet Order: Diet Heart Room service appropriate?: Yes; Fluid consistency:: Thin Pt is also offered choice of unit snacks mid-morning and mid-afternoon.  Pt is eating as desired.   Lab results and medications reviewed.   Tilda Franco, MS, RD, LDN Pager: [phone] After Hours Pager: [phone]
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
## 2015-07-14 NOTE — Progress Notes (Signed)
[facility] MD Progress Note  07/14/2015 3:39 PM Curtis Cobb  MRN:  [id number]  Subjective: Curtis Cobb states, "I feel restless, agitated. I need help"  Objective: Curtis Cobb is seen, chart reviewed. He is visible on the unit. Participating in group milieu. He says he feels agitated, restless. He says he is trying very hard to adjust to life without drugs, learn to live a clean life again. Curtis Cobb says he needs method, new direction & purpose to start living again. He denies any SIHI, AVH, delusional thoughts or paranoia.   Principal Problem: Severe recurrent major depression without psychotic features  Diagnosis:   Patient Active Problem List   Diagnosis Date Noted  . 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 Severe recurrent major depression without psychotic features [F33.2] 07/13/2015  . ADHD (attention deficit hyperactivity disorder) [F90.9] 07/13/2015  . Alcohol dependence [F10.20] 02/13/2014  . Cannabis abuse [F12.10] 02/13/2014   Total Time spent with patient: 30 minutes  Past Medical History:  Past Medical History  Diagnosis Date  . Bipolar 1 disorder   . Schizophrenia   . Depression   . Anxiety   . Rheumatoid arthritis   . Hypertension    History reviewed. No pertinent past surgical history. Family History: History reviewed. No pertinent family history. Social History:  History  Alcohol Use No     History  Drug Use  . Yes  . Special: Amphetamines    Comment: THC    History   Social History  . Marital Status: Single    Spouse Name: N/A  . Number of Children: N/A  . Years of Education: N/A   Social History Main Topics  . Smoking status: Former Smoker -- 0.00 packs/day for 0 years  . Smokeless tobacco: Not on file  . Alcohol Use: No  . Drug Use: Yes    Special: Amphetamines     Comment: THC  . Sexual Activity: Yes    Birth Control/ Protection: None   Other Topics Concern  . None    Social History Narrative   Additional History:    Sleep: Poor  Appetite:  Good  Musculoskeletal: Strength & Muscle Tone: within normal limits Gait & Station: normal Patient leans: N/A  Psychiatric Specialty Exam: Physical Exam  Review of Systems  Constitutional: Positive for malaise/fatigue.  HENT: Negative.   Eyes: Negative.   Respiratory: Negative.   Cardiovascular: Negative.   Gastrointestinal: Negative.   Genitourinary: Negative.   Musculoskeletal: Negative.   Skin: Negative.   Neurological: Positive for weakness.  Endo/Heme/Allergies: Negative.   Psychiatric/Behavioral: Positive for substance abuse (Polysubstance dependence). Negative for depression, suicidal ideas, hallucinations and memory loss. The patient is nervous/anxious and has insomnia.     Blood pressure 115/82, pulse 98, temperature 98.2 F (36.8 C), temperature source Oral, resp. rate 18, height 6' 1.62" (1.87 m), weight 84.006 kg (185 lb 3.2 oz), SpO2 100 %.Body mass index is 24.02 kg/(m^2).  General Appearance: Casual  Eye Contact::  Good  Speech:  Clear and Coherent  Volume:  Normal  Mood:  "Improving"  Affect:  Appropriate  Thought Process:  Coherent and Goal Directed  Orientation:  Full (Time, Place, and Person)  Thought Content:  Denies any hallucinations, delusions or paranoia  Suicidal Thoughts:  No  Homicidal Thoughts:  No  Memory:  Grossly intact  Judgement:  Intact  Insight:  Present  Psychomotor Activity:  Restlessness and some agitation  Concentration:  Fair  Recall:  Good  Fund of Knowledge:Fair  Language: Good  Akathisia:  No  Handed:  Right  AIMS (if indicated):     Assets:  Communication Skills Desire for Improvement  ADL's:  Intact  Cognition: WNL  Sleep:  Number of Hours: 5.75   Current Medications: Current Facility-Administered Medications  Medication Dose Route Frequency Provider Last Rate Last Dose  . acetaminophen (TYLENOL) tablet 650 mg  650 mg Oral Q6H PRN  Charm Rings, NP      . alum & mag hydroxide-simeth (MAALOX/MYLANTA) 200-200-20 MG/5ML suspension 30 mL  30 mL Oral Q4H PRN Charm Rings, NP      . amitriptyline (ELAVIL) tablet 50 mg  50 mg Oral QHS Charm Rings, NP   50 mg at 07/13/15 2112  . feeding supplement (ENSURE ENLIVE) (ENSURE ENLIVE) liquid 237 mL  237 mL Oral BID BM Tilda Franco, RD   237 mL at 07/14/15 1400  . hydrOXYzine (ATARAX/VISTARIL) tablet 25 mg  25 mg Oral Q4H PRN Charm Rings, NP   25 mg at 07/14/15 0744  . magnesium hydroxide (MILK OF MAGNESIA) suspension 30 mL  30 mL Oral Daily PRN Charm Rings, NP      . predniSONE (DELTASONE) tablet 20 mg  20 mg Oral Q breakfast Craige Cotta, MD   20 mg at 07/14/15 0742  . QUEtiapine (SEROQUEL) tablet 25 mg  25 mg Oral TID Sanjuana Kava, NP      . traZODone (DESYREL) tablet 100 mg  100 mg Oral QHS PRN Charm Rings, NP      . zolpidem (AMBIEN) tablet 10 mg  10 mg Oral QHS PRN Rachael Fee, MD   10 mg at 07/13/15 2112   Lab Results: No results found for this or any previous visit (from the past 48 hour(s)).  Physical Findings: AIMS: Facial and Oral Movements Muscles of Facial Expression: None, normal Lips and Perioral Area: None, normal Jaw: None, normal Tongue: None, normal,Extremity Movements Upper (arms, wrists, hands, fingers): None, normal Lower (legs, knees, ankles, toes): None, normal, Trunk Movements Neck, shoulders, hips: None, normal, Overall Severity Severity of abnormal movements (highest score from questions above): None, normal Incapacitation due to abnormal movements: None, normal Patient's awareness of abnormal movements (rate only patient's report): No Awareness, Dental Status Current problems with teeth and/or dentures?: No Does patient usually wear dentures?: No  CIWA:    COWS:  COWS Total Score: 2  Treatment Plan Summary: Daily contact with patient to assess and evaluate symptoms and progress in treatment and Medication management:   Plan:  Supportive approach/coping skills/relapse prevention           Depression: will continue Amitriptyline 50 mg and continue to work with mindfulness, CBT help  identify the cognitive distortions that keep the depression going.          Discuss other life style changes that can help with both his depression and his substance use such like exercise, meditation           Polysubstance dependence: will continue to manage associated agitation, high anxiety levels, will use motivational interviewing and encourage to pursue total abstinence.          Agitation: Initiate Seroquel 25 mg tid .         Continue to educate in terms of AA/NA and other recovery strategies.  Medical Decision Making:  Established Problem, Stable/Improving (1), Review of Psycho-Social Stressors (1), Review and summation of old records (2), Review of Last Therapy  Session (1), Review of Medication Regimen & Side Effects (2) and Review of New Medication or Change in Dosage (2)   Sanjuana Kava, PMHNP, FNP-BC 07/14/2015, 3:39 PM I agree with assessment and plan Madie Reno A. Dub Mikes, M.D.

## 2015-07-14 NOTE — Progress Notes (Addendum)
Psychoeducational Group Note  Date: 07/14/2015 Time  0930  Group Topic/Focus:   Relaxation Group: The focus of this group is to teach patients how to relax and to deescalate themselves as well as identify how beneficial this coping skill can be for them.  Participation Level:  Active  Participation Quality:  Appropriate  Affect:  Anxious  Cognitive:  Appropriate  Insight:  Engaged  Engagement in Group:  Engagaed  Additional Comments:    07/14/2015,1:03 PM Daniyal Tabor, Joie Bimler

## 2015-07-15 DIAGNOSIS — F332 Major depressive disorder, recurrent severe without psychotic features: Principal | ICD-10-CM

## 2015-07-15 MED ORDER — ZOLPIDEM TARTRATE 5 MG PO TABS
5.0000 mg | ORAL_TABLET | Freq: Every evening | ORAL | Status: DC | PRN
  Administered 2015-07-15: 5 mg via ORAL
  Filled 2015-07-15: qty 1

## 2015-07-15 NOTE — Progress Notes (Signed)
Patient ID: Curtis Cobb, male   DOB: 1971-08-17, 44 y.o.   MRN: [id number] Completed self inventory and gave self a 4 for feelings of depression and a 2 for anxiety and hopelessness. Goal today is to develop an exit plan.

## 2015-07-15 NOTE — Progress Notes (Signed)
Recreation Therapy Notes  Date: 07.18.16 Time: 9:30 am Location: [address] Group Room  Group Topic: Stress Management  Goal Area(s) Addresses:  Patient will verbalize importance of using healthy stress management.  Patient will identify positive emotions associated with healthy stress management.   Intervention: Stress Management  Activity :  Guided Imagery Script.  LRT introduced and educated patients on the stress management technique of guided imagery.  A script was used to deliver the technique to patients.  Patients were asked to follow the script read a loud by LRT to engage in practicing the stress management technique.  Education:  Stress Management, Discharge Planning.   Education Outcome: Acknowledges edcuation/In group clarification offered/Needs additional education  Clinical Observations/Feedback: Patient did not attend group.   Joan Herschberger, LRT/CTRS         Dempsy Damiano A 07/15/2015 2:07 PM 

## 2015-07-15 NOTE — Progress Notes (Addendum)
Patient ID: Curtis Cobb, male   DOB: [birth year], 44 y.o.   MRN: [id number] [facility] MD Progress Note  07/15/2015 1:03 PM Curtis Cobb  MRN:  [id number]  Subjective:  Patient states he is feeling better and at this time denies any ongoing psychotic symptoms. He states his mood is better. He reports that he does feel he has underlying ADHD and that Adderall did work to improve his focus. He is aware , however, that he was abusing it and that this likely contributed/triggered his psychiatric decompensation/psychotic episode .  Objective: [name] is seen, chart reviewed.  Patient is a 44 year old man , with a history of substance ( Stimulant ) induced psychosis. At this time patient is better, and presents calm, pleasant, and does not present with any clear psychotic symptoms. He denies hallucinations, no delusions are expressed, and does not appear guarded or paranoid. Behavior calm and in good control, no bizarre behavior noted or reported . He does remain ambivalent about being off Adderall and expressed wanting to restart this medication for ADHD symptoms- we reviewed rationale to avoid restarting stimulants based on  History of abuse and as per report two distinct psychotic decompensations. Patient expressed understanding. We reviewed other options such as Atomoxetine, or Wellbutrin, but he states he has tried these medications in the past and they did not work well for him. At this time , as above, patient feeling better,and starting to focus more on discharge options- looking forward to returning home. No current medication side effects reported . Of note, patient states he has been diagnosed with RA and is being managed with steroids - we reviewed the potential for steroids to contribute to mood disorders, psychotic disorders, but patient states he has been on this medication for  A long time without side effects.  Patient is managed with Amitryptiline and denies any current side effects or anticholinergic  side effects at present .   Principal Problem: Severe recurrent major depression without psychotic features  Diagnosis:   Patient Active Problem List   Diagnosis Date Noted  . 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 Severe recurrent major depression without psychotic features [F33.2] 07/13/2015  . ADHD (attention deficit hyperactivity disorder) [F90.9] 07/13/2015  . Alcohol dependence [F10.20] 02/13/2014  . Cannabis abuse [F12.10] 02/13/2014   Total Time spent with patient: 20 minutes  Past Medical History:  Past Medical History  Diagnosis Date  . Bipolar 1 disorder   . Schizophrenia   . Depression   . Anxiety   . Rheumatoid arthritis   . Hypertension    History reviewed. No pertinent past surgical history. Family History: History reviewed. No pertinent family history. Social History:  History  Alcohol Use No     History  Drug Use  . Yes  . Special: Amphetamines    Comment: THC    History   Social History  . Marital Status: Single    Spouse Name: N/A  . Number of Children: N/A  . Years of Education: N/A   Social History Main Topics  . Smoking status: Former Smoker -- 0.00 packs/day for 0 years  . Smokeless tobacco: Not on file  . Alcohol Use: No  . Drug Use: Yes    Special: Amphetamines     Comment: THC  . Sexual Activity: Yes    Birth Control/ Protection: None   Other Topics Concern  . None   Social History Narrative   Additional  History:    Sleep:  Improved   Appetite:  Good  Musculoskeletal: Strength & Muscle Tone: within normal limits Gait & Station: normal Patient leans: N/A  Psychiatric Specialty Exam: Physical Exam  Review of Systems  Constitutional: Positive for malaise/fatigue.  HENT: Negative.   Eyes: Negative.   Respiratory: Negative.   Cardiovascular: Negative.   Gastrointestinal: Negative.   Genitourinary: Negative.    Musculoskeletal: Negative.   Skin: Negative.   Neurological: Positive for weakness.  Endo/Heme/Allergies: Negative.   Psychiatric/Behavioral: Positive for substance abuse (Polysubstance dependence). Negative for depression, suicidal ideas, hallucinations and memory loss. The patient is nervous/anxious and has insomnia.     Blood pressure 131/80, pulse 99, temperature 98.8 F (37.1 C), temperature source Oral, resp. rate 18, height 6' 1.62" (1.87 m), weight 185 lb 3.2 oz (84.006 kg), SpO2 100 %.Body mass index is 24.02 kg/(m^2).  General Appearance: Well Groomed  Patent attorney::  Good  Speech:  Clear and Coherent  Volume:  Normal  Mood:  "Improving"  Affect:  Appropriate- denies depression, affect reactive, mildly anxious   Thought Process:  Coherent and Goal Directed  Orientation:  Full (Time, Place, and Person)  Thought Content:  Denies any hallucinations, delusions or paranoia-   Suicidal Thoughts:  No denies any thoughts of hurting self or of SI  Homicidal Thoughts:  No  Memory:  Grossly intact  Judgement:  Other:  improving  Insight:  Fair  Psychomotor Activity:  Normal  Concentration:  Good  Recall:  Good  Fund of Knowledge:Good  Language: Good  Akathisia:  No  Handed:  Right  AIMS (if indicated):     Assets:  Communication Skills Desire for Improvement  ADL's:  Intact  Cognition: WNL  Sleep:  Number of Hours: 6.75   Current Medications: Current Facility-Administered Medications  Medication Dose Route Frequency Provider Last Rate Last Dose  . acetaminophen (TYLENOL) tablet 650 mg  650 mg Oral Q6H PRN Charm Rings, NP      . alum & mag hydroxide-simeth (MAALOX/MYLANTA) 200-200-20 MG/5ML suspension 30 mL  30 mL Oral Q4H PRN Charm Rings, NP      . amitriptyline (ELAVIL) tablet 50 mg  50 mg Oral QHS Charm Rings, NP   50 mg at 07/14/15 2107  . feeding supplement (ENSURE ENLIVE) (ENSURE ENLIVE) liquid 237 mL  237 mL Oral BID BM Tilda Franco, RD   237 mL at 07/15/15  1021  . hydrOXYzine (ATARAX/VISTARIL) tablet 25 mg  25 mg Oral Q4H PRN Charm Rings, NP   25 mg at 07/14/15 0744  . magnesium hydroxide (MILK OF MAGNESIA) suspension 30 mL  30 mL Oral Daily PRN Charm Rings, NP      . predniSONE (DELTASONE) tablet 20 mg  20 mg Oral Q breakfast Craige Cotta, MD   20 mg at 07/15/15 0800  . QUEtiapine (SEROQUEL) tablet 50 mg  50 mg Oral TID Sanjuana Kava, NP   50 mg at 07/15/15 1123  . traZODone (DESYREL) tablet 100 mg  100 mg Oral QHS PRN Charm Rings, NP      . zolpidem (AMBIEN) tablet 10 mg  10 mg Oral QHS PRN Rachael Fee, MD   10 mg at 07/14/15 2107   Lab Results: No results found for this or any previous visit (from the past 48 hour(s)).  Physical Findings: AIMS: Facial and Oral Movements Muscles of Facial Expression: None, normal Lips and Perioral Area: None, normal Jaw: None, normal Tongue: None,  normal,Extremity Movements Upper (arms, wrists, hands, fingers): None, normal Lower (legs, knees, ankles, toes): None, normal, Trunk Movements Neck, shoulders, hips: None, normal, Overall Severity Severity of abnormal movements (highest score from questions above): None, normal Incapacitation due to abnormal movements: None, normal Patient's awareness of abnormal movements (rate only patient's report): No Awareness, Dental Status Current problems with teeth and/or dentures?: No Does patient usually wear dentures?: No  CIWA:    COWS:  COWS Total Score: 2   Assessment- at this time patient is improved compared to admission- symptoms of psychosis have abated/resolved and currently does not present with any psychotic symptoms. It is felt that psychotic symptoms were likely related to stimulant abuse . Patient is insightful about this , but somewhat ambivalent about stopping Adderall altogether, which he feels helps his underlying ADHD. We reviewed this concern and examined rationale to avoid stimulants, and considered non stimulant treatment options  for ADHD. Anxiety persists, but is also decreased compared to admission status . No medication side effects reported .   Treatment Plan Summary: Daily contact with patient to assess and evaluate symptoms and progress in treatment and Medication management:   Plan: Supportive approach/coping skills/relapse prevention           Continue Amitriptyline 50 mg  QDAY for management of depression. We reviewed medication side effect profile .           Continue Seroquel  Now at 50  mgrs TID for management of anxiety and psychosis           On Prednisone 20 mgrs QDAY for management of RA/ autoimmune disorder- denies side effects or psychiatric issues stemming from this treatment.           Continue to encourage patient to avoid stimulant medications after discharge and to go to 12 step meetings, which he states have been helpful for him.            Decrease  Ambien  To 5 mgrs QHS  PRN Insomnia to minimize sedation. (  Will D/C Trazodone PRNS for same indication)   Medical Decision Making:  Established Problem, Stable/Improving (1), Review of Psycho-Social Stressors (1), Review and summation of old records (2), Review of Last Therapy Session (1), Review of Medication Regimen & Side Effects (2) and Review of New Medication or Change in Dosage (2)   Nehemiah Massed, MD 07/15/2015, 1:03 PM

## 2015-07-15 NOTE — [facility] Group Notes (Signed)
[facility] LCSW Aftercare Discharge Planning Group Note  07/15/2015 8:45 AM  Participation Quality: Alert, Appropriate and Oriented  Mood/Affect: Appropriate  Depression Rating: 2  Anxiety Rating: 4  Thoughts of Suicide: Pt denies SI/HI  Will you contract for safety? Yes  Current AVH: Pt denies  Plan for Discharge/Comments: Pt attended discharge planning group and actively participated in group. CSW discussed suicide prevention education with the group and encouraged them to discuss discharge planning and any relevant barriers. Pt reports improved mood and requests referrals for therapy in [facility]. Pt describes having a job and that he is ready to return to work.  Transportation Means: Pt reports access to transportation  Supports: No supports mentioned at this time  Chad Cordial, LCSWA 07/15/2015 1:04 PM

## 2015-07-15 NOTE — Progress Notes (Signed)
D: Patient in the hallway on approach.  Patient states he had a great day.  Patient states he has no complaints except he is not sleeping well at night. Patient states he had a visit from a friend today.  Patient states it was a good visit.   Patient denies SI/HI and denies AVH.   A: Staff to monitor Q 15 mins for safety.  Encouragement and support offered.  Scheduled medications administered per orders.  Ambien administered prn for sleep. R: Patient remains safe on the unit.  Patient attended group tonight.  Patient visible on the unit and interacting with peers.  Patient taking administered medications.

## 2015-07-15 NOTE — Progress Notes (Signed)
Patient ID: Curtis Cobb, male   DOB: 1970-12-30, 44 y.o.   MRN: [id number] D-States he is feeling better every day. He is pleasant, appropriate, and affect brightens appropriately.He states he is feeling ready for discharge, not interested in long term treatment. A-Support offered. Medications as ordered.Monitored for safety. R-No complaints voiced at this time. Waiting on Dr. To know disposition.

## 2015-07-15 NOTE — Progress Notes (Signed)
[facility] Group Notes:  (Nursing/MHT/Case Management/Adjunct)  Date:  07/15/2015  Time:  9:15 PM  Type of Therapy:  Psychoeducational Skills  Participation Level:  Active  Participation Quality:  Appropriate and Attentive  Affect:  Appropriate and Excited  Cognitive:  Appropriate  Insight:  Appropriate and Good  Engagement in Group:  Engaged  Modes of Intervention:  Activity  Summary of Progress/Problems: Pts played a therapeutic activity of [employer]. Pt was engaged and actively participated.  Sahithi Ordoyne C 07/15/2015, 9:15 PM 

## 2015-07-15 NOTE — [facility] Group Notes (Signed)
[facility] LCSW Group Therapy  07/15/2015 1:15pm  Type of Therapy:  Group Therapy vercoming Obstacles  Participation Level:  Minimal  Participation Quality:  Reserved  Affect:  Appropriate  Cognitive:  Appropriate and Oriented  Insight:  Developing/Improving and Improving  Engagement in Therapy:  Improving  Modes of Intervention:  Discussion, Exploration, Problem-solving and Support  Description of Group:   In this group patients will be encouraged to explore what they see as obstacles to their own wellness and recovery. They will be guided to discuss their thoughts, feelings, and behaviors related to these obstacles. The group will process together ways to cope with barriers, with attention given to specific choices patients can make. Each patient will be challenged to identify changes they are motivated to make in order to overcome their obstacles. This group will be process-oriented, with patients participating in exploration of their own experiences as well as giving and receiving support and challenge from other group members.  Summary of Patient Progress: Pt participated minimally in group discussion, however was pleasant when prompted. Pt reported that social anxiety often keep him from reaching out for support; he expresses that he would like to overcome this at discharge in order to more effectively maintain wellness.   Therapeutic Modalities:   Cognitive Behavioral Therapy Solution Focused Therapy Motivational Interviewing Relapse Prevention Therapy   Chad Cordial, LCSWA 07/15/2015 5:37 PM

## 2015-07-16 MED ORDER — QUETIAPINE FUMARATE 50 MG PO TABS: 50.0000 mg | ORAL_TABLET | Freq: Three times a day (TID) | ORAL | Status: DC

## 2015-07-16 MED ORDER — AMITRIPTYLINE HCL 50 MG PO TABS: 50.0000 mg | ORAL_TABLET | Freq: Every day | ORAL | Status: DC

## 2015-07-16 MED ORDER — PREDNISONE 20 MG PO TABS: 20.0000 mg | ORAL_TABLET | Freq: Every day | ORAL | Status: DC

## 2015-07-16 MED ORDER — ZOLPIDEM TARTRATE 5 MG PO TABS: 5.0000 mg | ORAL_TABLET | Freq: Every evening | ORAL | Status: DC | PRN

## 2015-07-16 NOTE — Progress Notes (Signed)
Patient ID: Curtis Cobb, male   DOB: 25-Jul-1971, 44 y.o.   MRN: [id number]   Pt currently presents with a pleasant affect and euthymic behavior. Per self inventory, pt rates depression at a 1, hopelessness 0 and anxiety 1. Pt's daily goal is "my home plan" and they intend to do so by "review with staff." Pt reports fair sleep, good concentration, normal energy and a good appetite. Pt welcomes and emotionally encouraged his roommate.   Pt provided with medications per providers orders. Pt's labs and vitals were monitored throughout the day. Pt supported emotionally and encouraged to express concerns and questions. Pt educated on medications and diagnosis of depression.  Pt's safety ensured with 15 minute and environmental checks. Pt currently denies SI/HI and A/V hallucinations. Pt verbally agrees to seek staff if SI/HI or A/VH occurs and to consult with staff before acting on these thoughts. Will continue POC. Pt to be discharged today. Pt states "I'm glad I came here, I really needed the help."

## 2015-07-16 NOTE — Progress Notes (Signed)
Adult Psychoeducational Group Note  Date:  07/16/2015 Time: 0900am  Group Topic/Focus:  Diagnosis Education:   The focus of this group is to discuss the major disorders that patients maybe diagnosed with.  Group discusses the importance of knowing what one's diagnosis is so that one can understand treatment and better advocate for oneself.  Participation Level:  Active  Participation Quality:  Appropriate and Attentive  Affect:  Appropriate  Cognitive:  Alert and Oriented  Insight: Good  Engagement in Group:  Engaged and Improving  Modes of Intervention:  Clarification, Discussion, Education, Orientation and Support  Additional Comments:  Pt able to identify one daily goal to accomplish today.   Aurora Mask 07/16/2015, 1:27 PM

## 2015-07-16 NOTE — Progress Notes (Signed)
Patient ID: Curtis Cobb, male   DOB: 1971-02-10, 44 y.o.   MRN: [id number]   Pt discharged home with his girlfriend. Pt was stable and appreciative at the time of discharge. All papers and prescriptions were given and valuables returned. Verbal understanding expressed. Denies SI/HI and A/VH. Pt given opportunity to express concerns and ask questions. Pt able to verbalize the need to continue with follow up appointments post discharge.

## 2015-07-16 NOTE — Discharge Summary (Signed)
Physician Discharge Summary Note  Patient:  Curtis Cobb is an 44 y.o., male MRN:  [id number] DOB:  09-16-1971 Patient phone:  [phone] (home)  Patient address:   [address] [place] [address],  Total Time spent with patient: 30 minutes  Date of Admission:  07/12/2015 Date of Discharge: 07/16/15  Reason for Admission:  Mood stabilization treatments  Principal Problem: Severe recurrent major depression without psychotic features Discharge Diagnoses: Patient Active Problem List   Diagnosis Date Noted  . 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 Severe recurrent major depression without psychotic features [F33.2] 07/13/2015  . ADHD (attention deficit hyperactivity disorder) [F90.9] 07/13/2015  . Alcohol dependence [F10.20] 02/13/2014  . Cannabis abuse [F12.10] 02/13/2014   Musculoskeletal: Strength & Muscle Tone: within normal limits Gait & Station: normal Patient leans: N/A  Psychiatric Specialty Exam: Physical Exam  Psychiatric: He has a normal mood and affect. His speech is normal and behavior is normal. Judgment and thought content normal. Cognition and memory are normal.    Review of Systems  Constitutional: Negative.   HENT: Negative.   Eyes: Negative.   Respiratory: Negative.   Cardiovascular: Negative.   Gastrointestinal: Negative.   Genitourinary: Negative.   Musculoskeletal: Negative.   Skin: Negative.   Neurological: Negative.   Endo/Heme/Allergies: Negative.   Psychiatric/Behavioral: Positive for depression (Stabilized ) and substance abuse (Prior abuse of Adderall, patient instructed not to resume after d/c by Dr. Jama Flavors ). Negative for suicidal ideas, hallucinations and memory loss. The patient is not nervous/anxious and does not have insomnia.     Blood pressure 123/83, pulse 95, temperature 99.2 F (37.3 C), temperature source Oral, resp. rate 18,  height 6' 1.62" (1.87 m), weight 84.006 kg (185 lb 3.2 oz), SpO2 100 %.Body mass index is 24.02 kg/(m^2).  See Physician SRA     Have you used any form of tobacco in the last 30 days? (Cigarettes, Smokeless Tobacco, Cigars, and/or Pipes): No  Has this patient used any form of tobacco in the last 30 days? (Cigarettes, Smokeless Tobacco, Cigars, and/or Pipes) No  Past Medical History:  Past Medical History  Diagnosis Date  . Bipolar 1 disorder   . Schizophrenia   . Depression   . Anxiety   . Rheumatoid arthritis   . Hypertension    History reviewed. No pertinent past surgical history. Family History: History reviewed. No pertinent family history. Social History:  History  Alcohol Use No     History  Drug Use  . Yes  . Special: Amphetamines    Comment: THC    History   Social History  . Marital Status: Single    Spouse Name: N/A  . Number of Children: N/A  . Years of Education: N/A   Social History Main Topics  . Smoking status: Former Smoker -- 0.00 packs/day for 0 years  . Smokeless tobacco: Not on file  . Alcohol Use: No  . Drug Use: Yes    Special: Amphetamines     Comment: THC  . Sexual Activity: Yes    Birth Control/ Protection: None   Other Topics Concern  . None   Social History Narrative    Risk to Self: Is patient at risk for suicide?: No What has been your use of drugs/alcohol within the last 12 months?: Historically THC, ETOH, Pain pills;  has been using Adderall Risk to Others:   Prior Inpatient Therapy:   Prior Outpatient Therapy:  Level of Care:  OP  Hospital Course:    Curtis Cobb is an 44 y.o. male. Returns to ED with complaints of auditory hallucinations. He reports he has been abusing adderrall again for the past couple of weeks. He reports when he slowed down amphetamine use his AH declined. He reports sx increased again with adderrall abuse. He reports he has been taking 200+ mg per day, denies use of other drugs or alcohol He  reports he has not been taking his mental health medications. Reports increased depression and anxiety, not eating or sleeping, not being able to go to work. He reports AH narrating what he is doing claiming he wants to harm his SO and that the voices are going to set him up. "I came to get help, I don't know what to do." When presented on 06-25-15 he was discharged by psychiatry to follow up with OP resources. Patient did not follow through.           Curtis Cobb was admitted to the adult 400 unit. He was evaluated and his symptoms were identified. Medication management was discussed and initiated. Patient was started on Elavil 50 mg at hs for depression and insomnia. He was also started on Seroquel 50 mg TID for anxiety and mood control. He was oriented to the unit and encouraged to participate in unit programming. Medical problems were identified and treated appropriately. Patient was continued on Prednisone 20 mg daily for management of rheumatoid arthritis. Home medication was restarted as needed.        The patient was evaluated each day by a clinical provider to ascertain the patient's response to treatment.  Improvement was noted by the patient's report of decreasing symptoms, improved sleep and appetite, affect, medication tolerance, behavior, and participation in unit programming.  He was asked each day to complete a self inventory noting mood, mental status, pain, new symptoms, anxiety and concerns. Patient denied any ongoing psychotic symptoms. He requested to be restarted on Adderall for ADHD symptoms but it was discussed that due to abusing the medication there had been two psychotic decompensations. Other options such as Wellbutrin were reviewed but patient reported ineffectiveness in the past.          He responded well to medication and being in a therapeutic and supportive environment. Positive and appropriate behavior was noted and the patient was motivated for recovery.  The patient worked  closely with the treatment team and case manager to develop a discharge plan with appropriate goals. Coping skills, problem solving as well as relaxation therapies were also part of the unit programming. Prior to discharge the patient was encouraged by Dr. Jama Flavors to abstain completely from all addictive substances including stimulants.          By the day of discharge he was in much improved condition than upon admission.  Symptoms were reported as significantly decreased or resolved completely. The patient denied SI/HI and voiced no AVH. He was motivated to continue taking medication with a goal of continued improvement in mental health.     Steward Drone was discharged home with a plan to follow up as noted below. The patient was provided with sample medications and prescriptions at time of discharge. He left [facility] in stable condition with all belongings returned to him.    Consults:  psychiatry  Significant Diagnostic Studies:  CBC, Chemistry profile, UDS positive for amphetamines   Discharge Vitals:   Blood pressure 123/83, pulse 95, temperature 99.2 F (  37.3 C), temperature source Oral, resp. rate 18, height 6' 1.62" (1.87 m), weight 84.006 kg (185 lb 3.2 oz), SpO2 100 %. Body mass index is 24.02 kg/(m^2). Lab Results:   No results found for this or any previous visit (from the past 72 hour(s)).  Physical Findings: AIMS: Facial and Oral Movements Muscles of Facial Expression: None, normal Lips and Perioral Area: None, normal Jaw: None, normal Tongue: None, normal,Extremity Movements Upper (arms, wrists, hands, fingers): None, normal Lower (legs, knees, ankles, toes): None, normal, Trunk Movements Neck, shoulders, hips: None, normal, Overall Severity Severity of abnormal movements (highest score from questions above): None, normal Incapacitation due to abnormal movements: None, normal Patient's awareness of abnormal movements (rate only patient's report): No Awareness, Dental  Status Current problems with teeth and/or dentures?: No Does patient usually wear dentures?: No  CIWA:    COWS:  COWS Total Score: 2   See Psychiatric Specialty Exam and Suicide Risk Assessment completed by Attending Physician prior to discharge.  Discharge destination:  Home  Is patient on multiple antipsychotic therapies at discharge:  No   Has Patient had three or more failed trials of antipsychotic monotherapy by history:  No  Recommended Plan for Multiple Antipsychotic Therapies: NA     Medication List    STOP taking these medications        carbamazepine 200 MG 12 hr tablet  Commonly known as:  TEGRETOL XR     lithium carbonate 300 MG CR tablet  Commonly known as:  LITHOBID     traZODone 100 MG tablet  Commonly known as:  DESYREL      TAKE these medications      Indication   amitriptyline 50 MG tablet  Commonly known as:  ELAVIL  Take 1 tablet (50 mg total) by mouth at bedtime.   Indication:  Depression, Trouble Sleeping, Insomnia     hydrOXYzine 25 MG tablet  Commonly known as:  ATARAX/VISTARIL  Take 1 tablet (25 mg total) by mouth every 4 (four) hours as needed for anxiety (Sleep).   Indication:  Anxiety associated with Organic Disease, Tension     predniSONE 20 MG tablet  Commonly known as:  DELTASONE  Take 1 tablet (20 mg total) by mouth daily with breakfast.   Indication:  Rheumatoid Arthritis     QUEtiapine 50 MG tablet  Commonly known as:  SEROQUEL  Take 1 tablet (50 mg total) by mouth 3 (three) times daily.   Indication:  Agitation     zolpidem 5 MG tablet  Commonly known as:  AMBIEN  Take 1 tablet (5 mg total) by mouth at bedtime as needed for sleep.   Indication:  Trouble Sleeping       Follow-up Information    Follow up with [facility] On 08/05/2015.   Why:  at 11:20am for medication management with Dr. Omelia Blackwater.   Contact information:   [address] [place], [place] [address] Phone: [phone]      Follow up with  [facility].   Specialty:  Behavioral Health   Why:  Please walk in between 8am-3pm Monday-Friday to be set up with a therapist.   Contact information:   [address] [place] [address] [phone]       Follow up with [facility]    . Call today.   Why:  For follow up if needed for rheumatoid arthritis and for refills of prednisone    Contact information:   [address] [place] [address] [phone]      Follow-up recommendations:   Activity: as tolerated  Diet: Regular Tests: NA Other: see below  Comments:   Take all your medications as prescribed by your mental healthcare provider.  Report any adverse effects and or reactions from your medicines to your outpatient provider promptly.  Patient is instructed and cautioned to not engage in alcohol and or illegal drug use while on prescription medicines.  In the event of worsening symptoms, patient is instructed to call the crisis hotline, 911 and or go to the nearest ED for appropriate evaluation and treatment of symptoms.  Follow-up with your primary care provider for your other medical issues, concerns and or health care needs.   Total Discharge Time: Greater than 30 minutes  Signed: DAVIS, LAURA  NP-C  07/16/2015, 4:56 PM   Patient seen, Suicide Assessment Completed.  Disposition Plan Reviewed

## 2015-07-16 NOTE — Tx Team (Signed)
Interdisciplinary Treatment Plan Update (Adult) Date: 07/16/2015   Date: 07/16/2015 8:43 AM  Progress in Treatment:  Attending groups: Yes  Participating in groups: Yes  Taking medication as prescribed: Yes  Tolerating medication: Yes  Family/Significant othe contact made: No, Pt declines family contact Patient understands diagnosis: Yes Discussing patient identified problems/goals with staff: Yes  Medical problems stabilized or resolved: Yes  Denies suicidal/homicidal ideation: Yes Patient has not harmed self or Others: Yes   New problem(s) identified: None identified at this time.   Discharge Plan or Barriers: Pt will return home and follow-up at [facility] for medication management and [facility] for therapy.  Additional comments:  Curtis Cobb is a 44yo male hospitalized with increase in auditory hallucinations, abuse of his Adderall prescription over the past couple of weeks. He spent much of assessment wondering if he should sign paperwork for his prescriber Dr. Omelia Blackwater to get records on this hospitalization which will inform that pt is abusing his Adderall prescription, likely cause it to be stopped. He does not currently desire rehab, but may change his mind. He does not currently want SPE provided to girlfriend but may change his mind - has a past suicide attempt but no current SI.   Reason for Continuation of Hospitalization:  Depression Medication stabilization Withdrawal symptoms Psychotic  Estimated length of stay: 0 days  For review of initial/current patient goals, please see plan of care.   Attendees:  Patient:    Family:    Physician: Dr. Jama Flavors MD  07/16/2015 8:26 AM  Nursing:   07/16/2015 8:26 AM  Clinical Social Worker Leotis Shames Davina Poke, MSW 07/16/2015 8:26 AM  Other: Leisa Lenz, Vesta Mixer Liasion 07/16/2015 8:26 AM  Clinical:  Quintella Reichert, RN; Earl Many, RN; Jan, RN 07/16/2015 8:26 AM  Other: , RN Charge Nurse 07/16/2015 8:26 AM  Other:      Chad Cordial, Theresia Majors MSW

## 2015-07-16 NOTE — [facility] Suicide Risk Assessment (Signed)
[facility] Discharge Suicide Risk Assessment   Demographic Factors:  44 year old single male, employed, has supportive girlfriend .  Total Time spent with patient: 30 minutes  Musculoskeletal: Strength & Muscle Tone: within normal limits Gait & Station: normal Patient leans: N/A  Psychiatric Specialty Exam: Physical Exam  ROS  Blood pressure 123/83, pulse 95, temperature 99.2 F (37.3 C), temperature source Oral, resp. rate 18, height 6' 1.62" (1.87 m), weight 185 lb 3.2 oz (84.006 kg), SpO2 100 %.Body mass index is 24.02 kg/(m^2).  General Appearance: Well Groomed  Eye Contact::  Good  Speech:  Normal Rate409  Volume:  Normal  Mood:  improved, at this time euthymic, denies depression at present  Affect:  Appropriate  Thought Process:  Linear  Orientation:  Full (Time, Place, and Person)  Thought Content:  no ongoing hallucinations, no delusions, does not appear internally preoccupied   Suicidal Thoughts:  No  Homicidal Thoughts:  No  Memory:  recent and remote grossly intact   Judgement:  Other:  improving   Insight:  improving   Psychomotor Activity:  Normal  Concentration:  Good  Recall:  Good  Fund of Knowledge:Good  Language: Good  Akathisia:  Negative  Handed:  Right  AIMS (if indicated):     Assets:  Communication Skills Desire for Improvement Resilience Social Support  Sleep:  Number of Hours: 5  Cognition: WNL  ADL's:  Improved    Have you used any form of tobacco in the last 30 days? (Cigarettes, Smokeless Tobacco, Cigars, and/or Pipes): No  Has this patient used any form of tobacco in the last 30 days? (Cigarettes, Smokeless Tobacco, Cigars, and/or Pipes) No  Mental Status Per Nursing Assessment::   On Admission:  NA  Current Mental Status by Physician: At this time patient is improved compared to admission- mood is improved, affect is appropriate and reactive, behavior is calm, no agitation or restlessness , does not appear to be in any WDL or acute  distress, no current psychotic symptoms- hallucinations have resolved and at this time does not appear internally preoccupied or bizarre/paranoid. No delusions expressed, no SI or HI.   Loss Factors:  employment difficulties , financial issues   Historical Factors: History of Stimulant Abuse, History of psychosis, felt to be related to stimulant abuse   Risk Reduction Factors:   Living with another person, especially a relative, Positive social support and Positive coping skills or problem solving skills  Continued Clinical Symptoms:  As noted, currently improved and feeling better than  Upon admission- no ongoing symptoms of psychosis, no SI or HI, increased insight into substance dependence and importance of avoiding stimulants/addictive substances .  Cognitive Features That Contribute To Risk:  No gross cognitive deficits noted upon discharge. Is alert , attentive, and oriented x 3   Suicide Risk:  Mild:  Suicidal ideation of limited frequency, intensity, duration, and specificity.  There are no identifiable plans, no associated intent, mild dysphoria and related symptoms, good self-control (both objective and subjective assessment), few other risk factors, and identifiable protective factors, including available and accessible social support.  Principal Problem: Severe recurrent major depression without psychotic features Discharge Diagnoses:  Patient Active Problem List   Diagnosis Date Noted  . 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 Severe recurrent major depression without psychotic features [F33.2] 07/13/2015  . ADHD (attention deficit hyperactivity disorder) [F90.9] 07/13/2015  . Alcohol dependence [F10.20] 02/13/2014  . Cannabis abuse [F12.10] 02/13/2014  Follow-up Information    Follow up with [facility] On 08/05/2015.   Why:  at 11:20am for medication management  with Dr. Omelia Blackwater.   Contact information:   [address] [place], [place] [address] Phone: [phone]      Follow up with [facility].   Specialty:  Behavioral Health   Why:  Please walk in between 8am-3pm Monday-Friday to be set up with a therapist.   Contact information:   [address] [place] [address] [phone]       Follow up with [facility]    . Call today.   Why:  For follow up if needed for rheumatoid arthritis and for refills of prednisone    Contact information:   [address] [place] [address] [phone]      Plan Of Care/Follow-up recommendations:  Activity:  as tolerated  Diet:  Regular Tests:  NA Other:  see below  Is patient on multiple antipsychotic therapies at discharge:  No   Has Patient had three or more failed trials of antipsychotic monotherapy by history:  No  Recommended Plan for Multiple Antipsychotic Therapies: NA  Patient is leaving unit in good spirits. Plans to return home- states GF very supportive Follow up as above  Encouraged to abstain completely from all addictive substances /stimulants . Encouraged to attend NA or AA.  Curtis Cobb, Curtis Cobb 07/16/2015, 1:59 PM

## 2015-07-16 NOTE — Plan of Care (Signed)
Problem: Diagnosis: Increased Risk For Suicide Attempt Goal: LTG-Patient Will Report Improved Mood and Deny Suicidal LTG (by discharge) Patient will report improved mood and deny suicidal ideation.  Outcome: Progressing Patient denies SI.  Patient verbally contracts for safety.     

## 2015-07-17 NOTE — Progress Notes (Signed)
  [facility] Adult Case Management Discharge Plan :  Will you be returning to the same living situation after discharge:  Yes,  Pt returning home At discharge, do you have transportation home?: Yes,  girlfriend to pick up Do you have the ability to pay for your medications: Yes,  Pt provided with samples and prescriptions  Release of information consent forms completed and in the chart;  Patient's signature needed at discharge.  Patient to Follow up at: Follow-up Information    Follow up with [facility] On 08/05/2015.   Why:  at 11:20am for medication management with Dr. Omelia Blackwater.   Contact information:   [address] [place], [place] [address] Phone: [phone]      Follow up with [facility].   Specialty:  Behavioral Health   Why:  Please walk in between 8am-3pm Monday-Friday to be set up with a therapist.   Contact information:   [address] [place] [address] [phone]       Follow up with [facility]    . Call today.   Why:  For follow up if needed for rheumatoid arthritis and for refills of prednisone    Contact information:   [address] [facility] [address] [phone]      Patient denies SI/HI: Yes,  Pt denies    Safety Planning and Suicide Prevention discussed: Yes,  with Pt. Declined family contact  Have you used any form of tobacco in the last 30 days? (Cigarettes, Smokeless Tobacco, Cigars, and/or Pipes): No  Has patient been referred to the Quitline?: N/A patient is not a smoker  Elaina Hoops 07/17/2015, 11:35 AM

## 2015-10-05 ENCOUNTER — Encounter: Payer: Self-pay | Admitting: *Deleted

## 2015-10-05 ENCOUNTER — Emergency Department
Admission: EM | Admit: 2015-10-05 | Discharge: 2015-10-07 | Disposition: A | Discharge status: Other Acute Inpatient Hospital | Payer: Self-pay | Attending: Emergency Medicine | Admitting: Emergency Medicine

## 2015-10-05 DIAGNOSIS — Z87891 Personal history of nicotine dependence: Secondary | ICD-10-CM | POA: Insufficient documentation

## 2015-10-05 DIAGNOSIS — R45851 Suicidal ideations: Secondary | ICD-10-CM | POA: Insufficient documentation

## 2015-10-05 DIAGNOSIS — F1310 Sedative, hypnotic or anxiolytic abuse, uncomplicated: Secondary | ICD-10-CM | POA: Insufficient documentation

## 2015-10-05 DIAGNOSIS — Z79899 Other long term (current) drug therapy: Secondary | ICD-10-CM | POA: Insufficient documentation

## 2015-10-05 DIAGNOSIS — E876 Hypokalemia: Secondary | ICD-10-CM | POA: Insufficient documentation

## 2015-10-05 DIAGNOSIS — F329 Major depressive disorder, single episode, unspecified: Secondary | ICD-10-CM | POA: Insufficient documentation

## 2015-10-05 DIAGNOSIS — F1010 Alcohol abuse, uncomplicated: Secondary | ICD-10-CM

## 2015-10-05 DIAGNOSIS — I10 Essential (primary) hypertension: Secondary | ICD-10-CM | POA: Insufficient documentation

## 2015-10-05 DIAGNOSIS — F10120 Alcohol abuse with intoxication, uncomplicated: Secondary | ICD-10-CM | POA: Insufficient documentation

## 2015-10-05 HISTORY — DX: Alcohol dependence, uncomplicated: F10.20

## 2015-10-05 LAB — CBC WITH DIFFERENTIAL/PLATELET
BASOS ABS: 0.1 10*3/uL (ref 0–0.1)
Basophils Relative: 1 %
Eosinophils Absolute: 0.1 10*3/uL (ref 0–0.7)
Eosinophils Relative: 1 %
HEMATOCRIT: 43.9 % (ref 40.0–52.0)
HEMOGLOBIN: 14.7 g/dL (ref 13.0–18.0)
LYMPHS ABS: 3.2 10*3/uL (ref 1.0–3.6)
Lymphocytes Relative: 24 %
MCH: 30.2 pg (ref 26.0–34.0)
MCHC: 33.5 g/dL (ref 32.0–36.0)
MCV: 90.2 fL (ref 80.0–100.0)
Monocytes Absolute: 1 10*3/uL (ref 0.2–1.0)
Monocytes Relative: 8 %
NEUTROS ABS: 8.9 10*3/uL — AB (ref 1.4–6.5)
Neutrophils Relative %: 68 %
Platelets: 386 10*3/uL (ref 150–440)
RBC: 4.87 MIL/uL (ref 4.40–5.90)
RDW: 14.1 % (ref 11.5–14.5)
WBC: 13.2 10*3/uL — AB (ref 3.8–10.6)

## 2015-10-05 LAB — COMPREHENSIVE METABOLIC PANEL
ALT: 23 U/L (ref 17–63)
AST: 21 U/L (ref 15–41)
Albumin: 4.2 g/dL (ref 3.5–5.0)
Alkaline Phosphatase: 48 U/L (ref 38–126)
Anion gap: 11 (ref 5–15)
BUN: 6 mg/dL (ref 6–20)
CO2: 24 mmol/L (ref 22–32)
CREATININE: 0.93 mg/dL (ref 0.61–1.24)
Calcium: 8.8 mg/dL — ABNORMAL LOW (ref 8.9–10.3)
Chloride: 105 mmol/L (ref 101–111)
Glucose, Bld: 94 mg/dL (ref 65–99)
POTASSIUM: 3 mmol/L — AB (ref 3.5–5.1)
SODIUM: 140 mmol/L (ref 135–145)
Total Bilirubin: 0.6 mg/dL (ref 0.3–1.2)
Total Protein: 7.3 g/dL (ref 6.5–8.1)

## 2015-10-05 LAB — ETHANOL: ALCOHOL ETHYL (B): 258 mg/dL — AB (ref <5)

## 2015-10-05 MED ORDER — POTASSIUM CHLORIDE CRYS ER 20 MEQ PO TBCR
40.0000 meq | EXTENDED_RELEASE_TABLET | Freq: Once | ORAL | Status: AC
  Administered 2015-10-05: 40 meq via ORAL
  Filled 2015-10-05: qty 2

## 2015-10-05 MED ORDER — AZITHROMYCIN 250 MG PO TABS: 500.0000 mg | ORAL_TABLET | Freq: Once | ORAL | Status: DC

## 2015-10-05 MED ORDER — IPRATROPIUM-ALBUTEROL 0.5-2.5 (3) MG/3ML IN SOLN: 3.0000 mL | Freq: Once | RESPIRATORY_TRACT | Status: DC

## 2015-10-05 MED ORDER — PREDNISONE 20 MG PO TABS: 60.0000 mg | ORAL_TABLET | Freq: Once | ORAL | Status: DC

## 2015-10-05 NOTE — ED Notes (Signed)
Pt. Stated he was hungry.  Pt. Given meal tray and drink.
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
## 2015-10-05 NOTE — ED Notes (Signed)
Pt being transferred to [facility] at this time with ED tech and ODS officer. Pt made aware of room change and discussed plan of care to see MD in am, pt verbalized understanding at this time, pt calm and cooperative, no acute distress noted.

## 2015-10-05 NOTE — ED Notes (Signed)
BEHAVIORAL HEALTH ROUNDING Patient sleeping: No. Patient alert and oriented: yes Behavior appropriate: Yes.  ;  Nutrition and fluids offered: Yes  Toileting and hygiene offered: Yes  Sitter present: yes Law enforcement present: Yes  

## 2015-10-05 NOTE — ED Notes (Signed)
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
## 2015-10-05 NOTE — ED Notes (Signed)
Patient assigned to appropriate care area. Patient oriented to unit/care area: Informed that, for their safety, care areas are designed for safety and monitored by security cameras at all times; and visiting hours explained to patient. Patient verbalizes understanding, and verbal contract for safety obtained. 
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
## 2015-10-05 NOTE — ED Notes (Signed)
BEHAVIORAL HEALTH ROUNDING Patient sleeping: Yes.   Patient alert and oriented: yes Behavior appropriate: Yes.  ;  Nutrition and fluids offered: Yes  Toileting and hygiene offered: Yes  Sitter present: yes Law enforcement present: Yes  

## 2015-10-05 NOTE — ED Notes (Addendum)
ED BHU PLACEMENT JUSTIFICATION Is the patient under IVC or is there intent for IVC: Yes.   Is the patient medically cleared: Yes.   Is there vacancy in the ED BHU: Yes.   Is the population mix appropriate for patient: Yes.   Is the patient awaiting placement in inpatient or outpatient setting: Yes.   Has the patient had a psychiatric consult: No. Survey of unit performed for contraband, proper placement and condition of furniture, tampering with fixtures in bathroom, shower, and each patient room: Yes.  ; Findings:  APPEARANCE/BEHAVIOR calm, cooperative and adequate rapport can be established NEURO ASSESSMENT Orientation: time, place and person Hallucinations: No.None noted (Hallucinations) Speech: Normal Gait: normal RESPIRATORY ASSESSMENT Normal expansion.  Clear to auscultation.  No rales, rhonchi, or wheezing. CARDIOVASCULAR ASSESSMENT regular rate and rhythm, S1, S2 normal, no murmur, click, rub or gallop GASTROINTESTINAL ASSESSMENT soft, nontender, BS WNL, no r/g EXTREMITIES normal strength, tone, and muscle mass PLAN OF CARE Provide calm/safe environment. Vital signs assessed twice daily. ED BHU Assessment once each 12-hour shift. Collaborate with intake RN daily or as condition indicates. Assure the ED provider has rounded once each shift. Provide and encourage hygiene. Provide redirection as needed. Assess for escalating behavior; address immediately and inform ED provider.  Assess family dynamic and appropriateness for visitation as needed: Yes.  ; If necessary, describe findings:  Educate the patient/family about BHU procedures/visitation: Yes.  ; If necessary, describe findings:  

## 2015-10-05 NOTE — ED Notes (Signed)
BEHAVIORAL HEALTH ROUNDING  Patient sleeping: Yes.  Patient alert and oriented: no  Behavior appropriate: Yes. ; If no, describe:  Nutrition and fluids offered: No  Toileting and hygiene offered: No  Sitter present: no  Law enforcement present: Yes   

## 2015-10-06 ENCOUNTER — Encounter: Payer: Self-pay | Admitting: Psychiatry

## 2015-10-06 LAB — URINE DRUG SCREEN, QUALITATIVE (ARMC ONLY)
AMPHETAMINES, UR SCREEN: NOT DETECTED
BARBITURATES, UR SCREEN: POSITIVE — AB
BENZODIAZEPINE, UR SCRN: POSITIVE — AB
Cannabinoid 50 Ng, Ur ~~LOC~~: NOT DETECTED
Cocaine Metabolite,Ur ~~LOC~~: NOT DETECTED
MDMA (Ecstasy)Ur Screen: NOT DETECTED
METHADONE SCREEN, URINE: NOT DETECTED
Opiate, Ur Screen: NOT DETECTED
PHENCYCLIDINE (PCP) UR S: NOT DETECTED
Tricyclic, Ur Screen: NOT DETECTED

## 2015-10-06 MED ORDER — LORAZEPAM 2 MG PO TABS
2.0000 mg | ORAL_TABLET | Freq: Once | ORAL | Status: AC
  Administered 2015-10-06: 2 mg via ORAL

## 2015-10-06 MED ORDER — QUETIAPINE FUMARATE ER 50 MG PO TB24
50.0000 mg | ORAL_TABLET | Freq: Every day | ORAL | Status: DC
  Administered 2015-10-06: 50 mg via ORAL
  Filled 2015-10-06 (×2): qty 1

## 2015-10-06 MED ORDER — LORAZEPAM 2 MG PO TABS
ORAL_TABLET | ORAL | Status: AC
  Administered 2015-10-06: 2 mg via ORAL
  Filled 2015-10-06: qty 1

## 2015-10-06 NOTE — ED Notes (Signed)

## 2015-10-06 NOTE — ED Notes (Signed)
BEHAVIORAL HEALTH ROUNDING  Patient sleeping: Yes.  Patient alert and oriented: no  Behavior appropriate: Yes. ; If no, describe:  Nutrition and fluids offered: No  Toileting and hygiene offered: No  Sitter present: no  Law enforcement present: Yes   

## 2015-10-06 NOTE — ED Notes (Signed)
BEHAVIORAL HEALTH ROUNDING Patient sleeping: No. Patient alert and oriented: yes Behavior appropriate: Yes.  ; If no, describe:  Nutrition and fluids offered: Yes  Toileting and hygiene offered: Yes  Sitter present: no Law enforcement present: Yes  

## 2015-10-06 NOTE — ED Notes (Signed)
Report received from Amy H. RN. Pt. Alert and oriented in no distress; verbalizes SI; denies HI, AVH and pain.  Pt. Instructed to come to me with problems or concerns.Will continue to monitor for safety via security cameras and Q 15 minute checks.

## 2015-10-06 NOTE — ED Notes (Signed)
Patient resting quietly in room. No noted distress or abnormal behaviors noted. Will continue 15 minute checks and observation by security camera for safety. 

## 2015-10-06 NOTE — ED Notes (Signed)
Pt. Noted in room. No complaints or concerns voiced. No distress or abnormal behavior noted. Will continue to monitor with security cameras. Q 15 minute rounds continue. 

## 2015-10-06 NOTE — ED Notes (Signed)
Patient asleep in room. No noted distress or abnormal behavior. Will continue 15 minute checks and observation by security cameras for safety. 

## 2015-10-06 NOTE — ED Notes (Signed)
Pt. Up using bathroom. 

## 2015-10-06 NOTE — ED Notes (Signed)
Patient very anxious, tremulous. Does not remember all the events which resulted in hospitalization. CIWA = 10. Medicated per MD order. Patient denies history of seizures or DTs. Will continue to monitor closely. Maintained on 15 minute checks and observation by security camera for safety.

## 2015-10-06 NOTE — ED Notes (Signed)
Patient reports reduction in withdrawal symptoms. Less tremulous. Able to sleep. He did have a visit with girlfriend which went well.  Will continue to monitor closely, maintain all safety precautions.

## 2015-10-06 NOTE — ED Notes (Signed)
BEHAVIORAL HEALTH ROUNDING  Patient sleeping: No.  Patient alert and oriented: yes  Behavior appropriate: Yes. ; If no, describe:  Nutrition and fluids offered: Yes  Toileting and hygiene offered: Yes  Sitter present: not applicable  Law enforcement present: Yes ODS  

## 2015-10-06 NOTE — ED Notes (Signed)
CIWA score decreased. Patient in no acute distress. Maintained on 15 minute checks and observation by security camera for safety.

## 2015-10-06 NOTE — ED Notes (Signed)
Pt. Noted in  room watching the tv.;. No complaints or concerns voiced. No distress or abnormal behavior noted. Will continue to monitor with security cameras. Q 15 minute rounds continue. 

## 2015-10-06 NOTE — ED Notes (Signed)
Pt. Noted in room resting quietly;. No complaints or concerns voiced. No distress or abnormal behavior noted. Will continue to monitor with security cameras. Q 15 minute rounds continue. 
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
## 2015-10-06 NOTE — ED Provider Notes (Signed)
-----------------------------------------   7:12 AM on 10/06/2015 -----------------------------------------   Blood pressure 122/86, pulse 91, temperature 98.1 F (36.7 C), temperature source Oral, resp. rate 16, height 6\' 1"  (1.854 m), weight 200 lb (90.719 kg), SpO2 99 %.  The patient had no acute events since last update.  Calm and cooperative at this time.  Disposition is pending per Psychiatry/Behavioral Medicine team recommendations.     , MD 10/06/15 [phone]

## 2015-10-07 ENCOUNTER — Inpatient Hospital Stay
Admission: EM | Admit: 2015-10-07 | Discharge: 2015-10-12 | DRG: 885 | Disposition: A | Discharge status: Home / Self Care | Payer: No Typology Code available for payment source | Source: Intra-hospital | Attending: Psychiatry | Admitting: Psychiatry

## 2015-10-07 DIAGNOSIS — Z882 Allergy status to sulfonamides status: Secondary | ICD-10-CM | POA: Diagnosis not present

## 2015-10-07 DIAGNOSIS — F1111 Opioid abuse, in remission: Secondary | ICD-10-CM

## 2015-10-07 DIAGNOSIS — G4700 Insomnia, unspecified: Secondary | ICD-10-CM | POA: Diagnosis present

## 2015-10-07 DIAGNOSIS — F19959 Other psychoactive substance use, unspecified with psychoactive substance-induced psychotic disorder, unspecified: Secondary | ICD-10-CM | POA: Diagnosis present

## 2015-10-07 DIAGNOSIS — M069 Rheumatoid arthritis, unspecified: Secondary | ICD-10-CM | POA: Diagnosis present

## 2015-10-07 DIAGNOSIS — I10 Essential (primary) hypertension: Secondary | ICD-10-CM | POA: Diagnosis present

## 2015-10-07 DIAGNOSIS — F1220 Cannabis dependence, uncomplicated: Secondary | ICD-10-CM | POA: Diagnosis present

## 2015-10-07 DIAGNOSIS — Z87891 Personal history of nicotine dependence: Secondary | ICD-10-CM

## 2015-10-07 DIAGNOSIS — F1520 Other stimulant dependence, uncomplicated: Secondary | ICD-10-CM

## 2015-10-07 DIAGNOSIS — F4010 Social phobia, unspecified: Secondary | ICD-10-CM | POA: Diagnosis present

## 2015-10-07 DIAGNOSIS — Y908 Blood alcohol level of 240 mg/100 ml or more: Secondary | ICD-10-CM | POA: Diagnosis present

## 2015-10-07 DIAGNOSIS — F331 Major depressive disorder, recurrent, moderate: Principal | ICD-10-CM | POA: Diagnosis present

## 2015-10-07 DIAGNOSIS — F909 Attention-deficit hyperactivity disorder, unspecified type: Secondary | ICD-10-CM | POA: Diagnosis present

## 2015-10-07 DIAGNOSIS — F10239 Alcohol dependence with withdrawal, unspecified: Secondary | ICD-10-CM | POA: Diagnosis present

## 2015-10-07 DIAGNOSIS — Z818 Family history of other mental and behavioral disorders: Secondary | ICD-10-CM | POA: Diagnosis not present

## 2015-10-07 DIAGNOSIS — F1510 Other stimulant abuse, uncomplicated: Secondary | ICD-10-CM | POA: Diagnosis present

## 2015-10-07 DIAGNOSIS — F1110 Opioid abuse, uncomplicated: Secondary | ICD-10-CM | POA: Diagnosis present

## 2015-10-07 DIAGNOSIS — F419 Anxiety disorder, unspecified: Secondary | ICD-10-CM | POA: Diagnosis present

## 2015-10-07 DIAGNOSIS — F1020 Alcohol dependence, uncomplicated: Secondary | ICD-10-CM

## 2015-10-07 DIAGNOSIS — Z590 Homelessness: Secondary | ICD-10-CM | POA: Diagnosis not present

## 2015-10-07 DIAGNOSIS — F22 Delusional disorders: Secondary | ICD-10-CM | POA: Diagnosis present

## 2015-10-07 MED ORDER — LORAZEPAM 2 MG PO TABS: 2.0000 mg | ORAL_TABLET | Freq: Three times a day (TID) | ORAL | Status: DC | PRN

## 2015-10-07 MED ORDER — ALUM & MAG HYDROXIDE-SIMETH 200-200-20 MG/5ML PO SUSP: 30.0000 mL | ORAL | Status: DC | PRN

## 2015-10-07 MED ORDER — MAGNESIUM HYDROXIDE 400 MG/5ML PO SUSP: 30.0000 mL | Freq: Every day | ORAL | Status: DC | PRN

## 2015-10-07 MED ORDER — ONDANSETRON HCL 4 MG PO TABS: 4.0000 mg | ORAL_TABLET | Freq: Three times a day (TID) | ORAL | Status: DC | PRN

## 2015-10-07 MED ORDER — TRAZODONE HCL 100 MG PO TABS: 100.0000 mg | ORAL_TABLET | Freq: Every evening | ORAL | Status: DC | PRN

## 2015-10-07 MED ORDER — ACETAMINOPHEN 325 MG PO TABS
650.0000 mg | ORAL_TABLET | ORAL | Status: DC | PRN
  Administered 2015-10-07: 650 mg via ORAL

## 2015-10-07 MED ORDER — IBUPROFEN 600 MG PO TABS
600.0000 mg | ORAL_TABLET | Freq: Three times a day (TID) | ORAL | Status: DC | PRN
  Administered 2015-10-08: 600 mg via ORAL
  Filled 2015-10-07: qty 1

## 2015-10-07 MED ORDER — CHLORDIAZEPOXIDE HCL 25 MG PO CAPS
50.0000 mg | ORAL_CAPSULE | Freq: Three times a day (TID) | ORAL | Status: DC
  Administered 2015-10-07 – 2015-10-08 (×2): 50 mg via ORAL
  Filled 2015-10-07 (×3): qty 2

## 2015-10-07 MED ORDER — AMLODIPINE BESYLATE 2.5 MG PO TABS
2.5000 mg | ORAL_TABLET | Freq: Every day | ORAL | Status: DC
  Administered 2015-10-07 – 2015-10-09 (×3): 2.5 mg via ORAL
  Filled 2015-10-07 (×3): qty 1

## 2015-10-07 MED ORDER — CLONIDINE HCL 0.1 MG PO TABS: 0.1000 mg | ORAL_TABLET | Freq: Three times a day (TID) | ORAL | Status: DC | PRN

## 2015-10-07 NOTE — ED Notes (Signed)
BEHAVIORAL HEALTH ROUNDING Patient sleeping: Yes.   Patient alert and oriented: not applicable Behavior appropriate: Yes.  ; If no, describe:  Nutrition and fluids offered: Yes  Toileting and hygiene offered: Yes  Sitter present: yes Law enforcement present: Yes  

## 2015-10-07 NOTE — ED Notes (Signed)
BEHAVIORAL HEALTH ROUNDING Patient sleeping: Yes.   Patient alert and oriented: not applicable SLEEPING Behavior appropriate: Yes.  ; If no, describe: SLEEPING Nutrition and fluids offered: No SLEEPING Toileting and hygiene offered: NoSLEEPING Sitter present: not applicable Law enforcement present: Yes ODS 

## 2015-10-07 NOTE — ED Notes (Signed)
Pt. Noted in room resting quietly;. No complaints or concerns voiced. No distress or abnormal behavior noted. Will continue to monitor with security cameras. Q 15 minute rounds continue. 

## 2015-10-07 NOTE — ED Notes (Signed)
BEHAVIORAL HEALTH ROUNDING Patient sleeping: Yes.   Patient alert and oriented: yes Behavior appropriate: Yes.  ; If no, describe:  Nutrition and fluids offered: Yes  Toileting and hygiene offered: Yes  Sitter present: yes Law enforcement present: Yes  

## 2015-10-07 NOTE — Progress Notes (Signed)
Pt. is to be admitted to [facility] by Dr. Dr. Jenne Campus . Attending Physician will be Dr. Ardyth Harps.  Pt. has been assigned to room 320, by [facility] Charge Nurse GG Maniattu.  Intake Paper Work has been signed and placed on pt. chart. ER staff [facility] ER Sect.; Dr. Pershing Proud , ER MD; Herbert Seta Patient's Nurse & Felipa Eth Patient Access) have been made aware of the admission.   10/07/2015 Cheryl Flash, MS, NCC, LPCA Therapeutic Triage Specialist

## 2015-10-07 NOTE — ED Notes (Signed)
Pt. Noted in room resting quirtly. No complaints or concerns voiced. No distress or abnormal behavior noted. Will continue to monitor with security cameras. Q 15 minute rounds continue.

## 2015-10-07 NOTE — Tx Team (Signed)
Initial Interdisciplinary Treatment Plan   PATIENT STRESSORS: Financial difficulties Health problems Substance abuse   PATIENT STRENGTHS: Ability for insight Active sense of humor Communication skills   PROBLEM LIST: Problem List/Patient Goals Date to be addressed Date deferred Reason deferred Estimated date of resolution                                                         DISCHARGE CRITERIA:  Ability to meet basic life and health needs Adequate post-discharge living arrangements Improved stabilization in mood, thinking, and/or behavior  PRELIMINARY DISCHARGE PLAN: Attend aftercare/continuing care group Attend 12-step recovery group Return to previous living arrangement  PATIENT/FAMIILY INVOLVEMENT: This treatment plan has been presented to and reviewed with the patient, Steward Drone, and/or family member, .  The patient and family have been given the opportunity to ask questions and make suggestions.  Ignacia Felling 10/07/2015, 5:56 PM

## 2015-10-07 NOTE — Progress Notes (Signed)
Patient with appropriate affect and cooperative behavior with meals, meds and plan of care. Denies SI/HI at this time. Fair adls, dinner tray ordered for regular diet. VS monitored and recorded. Skin check performed with no wounds or bruises noted. Patient with varicose veins to left ankle. No contraband found during skin check. No contraband found during belonging check. Safety maintained.

## 2015-10-07 NOTE — ED Notes (Signed)
BEHAVIORAL HEALTH ROUNDING Patient sleeping: No. Patient alert and oriented: yes Behavior appropriate: Yes.  ; If no, describe:  Nutrition and fluids offered: Yes  Toileting and hygiene offered: Yes  Sitter present: yes Law enforcement present: Yes  

## 2015-10-07 NOTE — Plan of Care (Signed)
Problem: Ineffective individual coping Goal: LTG: Patient will report a decrease in negative feelings Outcome: Progressing No SI/HI at this time.      

## 2015-10-07 NOTE — ED Provider Notes (Signed)
-----------------------------------------   7:47 AM on 10/07/2015 -----------------------------------------   Blood pressure 133/77, pulse 72, temperature 98.3 F (36.8 C), temperature source Oral, resp. rate 20, height 6\' 1"  (1.854 m), weight 200 lb (90.719 kg), SpO2 99 %.  The patient had no acute events since last update.  Calm and cooperative at this time.  Disposition is pending per Psychiatry/Behavioral Medicine team recommendations.     , MD 10/07/15 [phone]

## 2015-10-07 NOTE — ED Notes (Signed)

## 2015-10-08 DIAGNOSIS — F1111 Opioid abuse, in remission: Secondary | ICD-10-CM

## 2015-10-08 DIAGNOSIS — F4010 Social phobia, unspecified: Secondary | ICD-10-CM

## 2015-10-08 DIAGNOSIS — F331 Major depressive disorder, recurrent, moderate: Principal | ICD-10-CM

## 2015-10-08 LAB — THYROID PANEL WITH TSH
FREE THYROXINE INDEX: 2.2 (ref 1.2–4.9)
T3 UPTAKE RATIO: 33 % (ref 24–39)
T4 TOTAL: 6.6 ug/dL (ref 4.5–12.0)
TSH: 4.04 u[IU]/mL (ref 0.450–4.500)

## 2015-10-08 MED ORDER — PREDNISONE 20 MG PO TABS
20.0000 mg | ORAL_TABLET | Freq: Every day | ORAL | Status: DC
  Administered 2015-10-08 – 2015-10-12 (×5): 20 mg via ORAL
  Filled 2015-10-08 (×5): qty 1

## 2015-10-08 MED ORDER — RISPERIDONE 1 MG PO TABS
0.5000 mg | ORAL_TABLET | Freq: Two times a day (BID) | ORAL | Status: DC
  Administered 2015-10-08 – 2015-10-10 (×5): 0.5 mg via ORAL
  Filled 2015-10-08 (×5): qty 1

## 2015-10-08 MED ORDER — CHLORDIAZEPOXIDE HCL 25 MG PO CAPS
50.0000 mg | ORAL_CAPSULE | Freq: Three times a day (TID) | ORAL | Status: DC
  Administered 2015-10-08 – 2015-10-09 (×3): 50 mg via ORAL
  Filled 2015-10-08 (×3): qty 2

## 2015-10-08 MED ORDER — AMITRIPTYLINE HCL 50 MG PO TABS
100.0000 mg | ORAL_TABLET | Freq: Every day | ORAL | Status: DC
  Administered 2015-10-08 – 2015-10-11 (×4): 100 mg via ORAL
  Filled 2015-10-08: qty 2
  Filled 2015-10-08 (×2): qty 1
  Filled 2015-10-08 (×2): qty 2

## 2015-10-08 MED ORDER — CHLORDIAZEPOXIDE HCL 25 MG PO CAPS: 25.0000 mg | ORAL_CAPSULE | Freq: Three times a day (TID) | ORAL | Status: DC

## 2015-10-08 NOTE — Progress Notes (Signed)
Patient was pleasant this shift. Patient denies any SI or HI at this time.  Patient states he is not sleeping well at night.  Patients appetite has been good, energy level has been appropriate.  Patient states that his depression is a 7 on a scale from 1 to 10.  Patient states that he is experiencing hopelessness with rating it a 4 on a scale from 1 to 10.  Patients anxiety level is at a 5 this shift.  Patient denies any auditory or visual hallucinations at this time.  Patient is experiencing pain in his joints.  Patient appears flat.  Patient is seen in the milieu several times and has attended groups this shift.  Patient in no distress at this time.  A: support and encouragement provided and given.  Medications given as prescribed.  Patient compliant with medications.  q 15 min checks.   R: patient receptive of care provided.

## 2015-10-08 NOTE — Tx Team (Signed)
Interdisciplinary Treatment Plan Update (Adult)  Date:  10/08/2015 Time Reviewed:  4:57 PM  Progress in Treatment: Attending groups: Yes. Participating in groups:  Yes. Taking medication as prescribed:  Yes. Tolerating medication:  Yes. Family/Significant othe contact made:  No, will contact:  if patient provides consent Patient understands diagnosis:  Yes. Discussing patient identified problems/goals with staff:  Yes. Medical problems stabilized or resolved:  Yes. Denies suicidal/homicidal ideation: Yes. Issues/concerns per patient self-inventory:  No. Other:  New problem(s) identified: Yes, Describe:  patient is currently homeless  Discharge Plan or Barriers: Patient will need housing and is given shelter number, list of [place] boarding houses, and list of [place]. Patient will need mental health followup in [facility]  Reason for Continuation of Hospitalization: Depression Suicidal ideation  Comments:  Estimated length of stay: up to 3 days expected discharge by Friday 10/10/15  New goal(s):  Review of initial/current patient goals per problem list:   1.  Goal(s): decrease depression  Met:  No  Target date: 10/09/15  As evidenced by: patient report   2.  Goal (s): find housing  Met:  No  Target date: 10/10/15  As evidenced by: patient report  3.  Goal(s): eliminate SI  Met:  Yes  Attendees: Physician:  Merlyn Albert, MD 10/11/20164:57 PM  Nursing:   Mordecai Rasmussen, RN 10/11/20164:57 PM  Other:  Carmell Austria, [place] 10/11/20164:57 PM  Other:   10/11/20164:57 PM  Other:   10/11/20164:57 PM  Other:  10/11/20164:57 PM  Other:  10/11/20164:57 PM  Other:  10/11/20164:57 PM  Other:  10/11/20164:57 PM  Other:  10/11/20164:57 PM  Other:  10/11/20164:57 PM  Other:   10/11/20164:57 PM   Scribe for Treatment Team:   Keene Breath, MSW, LCSWA  10/08/2015, 4:57 PM

## 2015-10-08 NOTE — Progress Notes (Signed)
Recreation Therapy Notes  Date: 10.11.16 Time: 3:00 pm Location: Craft Room  Group Topic: Goal Setting  Goal Area(s) Addresses:  Patient will write at least one goal. Patient will write at least one obstacle.  Behavioral Response: Attentive, Interactive  Intervention: Recovery Goal Chart  Activity: Patients were instructed to make a goal chart including goals, obstacles, the date they started working on their goal, and the date they achieved their goal.  Education: LRT educated patients on healthy way to celebrate reaching their goals.  Education Outcome: Acknowledges education/In group clarification offered   Clinical Observations/Feedback: Patient completed activity by writing 3 goals and 3 obstacles. Patient contributed to group discussion by stating how he can stay focused on his goals, how he can overcome his obstacles, and that he has people he can count on to help him reach his goals.  Jacquelynn Cree, LRT/CTRS 10/08/2015 4:29 PM

## 2015-10-08 NOTE — Plan of Care (Signed)
Problem: Ineffective individual coping Goal: STG: Patient will remain free from self harm Outcome: Progressing Patient has remained free from harm this shift.  Patient has denied any SI or HI at this time.  Patients depression has been decreasing.

## 2015-10-08 NOTE — [facility] Group Notes (Signed)
[facility] LCSW Group Therapy  10/08/2015 2:26 PM  Type of Therapy:  Group Therapy  Participation Level:  Did Not Attend   Lulu Riding, MSW, LCSWA 10/08/2015, 2:26 PM

## 2015-10-08 NOTE — [facility] Group Notes (Signed)
[facility] Group Notes:  (Nursing/MHT/Case Management/Adjunct)  Date:  10/08/2015  Time:  1:53 PM  Type of Therapy:  Psychoeducational Skills  Participation Level:  Did Not Attend   Curtis Cobb 10/08/2015, 1:53 PM

## 2015-10-08 NOTE — BHH Suicide Risk Assessment (Signed)
[facility] Admission Suicide Risk Assessment   Nursing information obtained from:    Demographic factors:    Current Mental Status:    Loss Factors:    Historical Factors:    Risk Reduction Factors:    Total Time spent with patient: 1 hour Principal Problem: Major depressive disorder, recurrent episode, severe, with psychosis (HCC) Diagnosis:   Patient Active Problem List   Diagnosis Date Noted  . Opioid use disorder, mild, in sustained remission [F11.90] 10/08/2015  . Major depressive disorder, recurrent episode, severe, with psychosis (HCC) [F33.3] 10/08/2015  . Social phobia [F40.10] 10/08/2015  . Alcohol use disorder, severe, dependence (HCC) [F10.20] 10/07/2015  . Cannabis use disorder, severe, dependence (HCC) [F12.20] 10/07/2015  . 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 HTN (hypertension) [I10] 10/07/2015  . Rheumatoid arthritis (HCC) [M06.9] 10/07/2015     Continued Clinical Symptoms:  Alcohol Use Disorder Identification Test Final Score (AUDIT): 15 The "Alcohol Use Disorders Identification Test", Guidelines for Use in Primary Care, Second Edition.  World Science writer [facility]). Score between 0-7:  no or low risk or alcohol related problems. Score between 8-15:  moderate risk of alcohol related problems. Score between 16-19:  high risk of alcohol related problems. Score 20 or above:  warrants further diagnostic evaluation for alcohol dependence and treatment.   CLINICAL FACTORS:   Depression:   Comorbid alcohol abuse/dependence Alcohol/Substance Abuse/Dependencies Previous Psychiatric Diagnoses and Treatments Medical Diagnoses and Treatments/Surgeries   Psychiatric Specialty Exam: Physical Exam  ROS  Blood pressure 138/89, pulse 82, temperature 98 F (36.7 C), temperature source Oral, resp. rate 20, height 6\' 2"  (1.88 m), weight 86.637 kg (191 lb), SpO2 100 %.Body mass index is 24.51 kg/(m^2).    COGNITIVE FEATURES THAT CONTRIBUTE TO RISK:  None     SUICIDE RISK:   Moderate:  Frequent suicidal ideation with limited intensity, and duration, some specificity in terms of plans, no associated intent, good self-control, limited dysphoria/symptomatology, some risk factors present, and identifiable protective factors, including available and accessible social support.  PLAN OF CARE: admit to [facility]  Medical Decision Making:  Established Problem, Worsening (2)  I certify that inpatient services furnished can reasonably be expected to improve the patient's condition.   [facility] 10/08/2015, 2:20 PM

## 2015-10-08 NOTE — Progress Notes (Signed)
D: Patient denies SI/HI/AVH.  Patient affect and mood is anxious.  Patient interaction with staff and other patients was minimal.  Patient did attend evening group. Patient visible on the milieu. No distress noted. A: Support and encouragement offered. Scheduled medications given to pt. Q 15 min checks continued for patient safety. R: Patient receptive. Patient remains safe on the unit.

## 2015-10-08 NOTE — H&P (Signed)
Psychiatric Admission Assessment Adult  Patient Identification: Curtis Cobb MRN:  [id number] Date of Evaluation:  10/08/2015 Chief Complaint:  schizoaffective disorder Principal Diagnosis: Major depressive disorder, recurrent, moderate (HCC) Diagnosis:   Patient Active Problem List   Diagnosis Date Noted  . Opioid use disorder, mild, in sustained remission [F11.90] 10/08/2015  . Social phobia [F40.10] 10/08/2015  . 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 Major depressive disorder, recurrent, moderate (HCC) [F33.1] 10/08/2015  . Alcohol use disorder, severe, dependence (HCC) [F10.20] 10/07/2015  . Cannabis use disorder, severe, dependence (HCC) [F12.20] 10/07/2015  . 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 HTN (hypertension) [I10] 10/07/2015  . Rheumatoid arthritis (HCC) [M06.9] 10/07/2015   History of Present Illness: Curtis Cobb is a 44 y.o. male patient with a h/o  polysubstance use, major depression and ADHD who presents to [facility] ED with alcohol intoxication (presentation BAL 258) and getting into an altercation with his girlfriend. Pt's girlfriend called police and the pt picked her up and threatened to harm her. Per ED MD notes, the pt was brought to the ED by police for jumping out of a moving vehicle and alcohol intoxication. Patient's friends reported that he had not taken medication in 2 months. Friend noticed pt exhibited unusual behavior yesterday with "Missing segments of time." the pt shared with her he wanted to kill himself and he attempted to jump out of a moving car. She also notes he was drinking Vodka in her car. States he has exhibited threatening and paranoid and spoke of hurting others.   Patient was transferred from the emergency department to the [facility] behavioral health unit on October 10. He was started on a Librium taper to address alcohol withdrawals.  Today the patient tells me he started having  issues with depression around the age of 89. Around the same time he started experimenting with substances in order to treat his depressive symptoms. He is started abusing opiates and marijuana. However he states that he has not used any of these 2 drugs in several years. Patient states his main issue now is with alcohol. Patient has been in residential treatment twice in the past.   Patient states that he was purchasing a antidepressant on the Internet that is called Stablon.  This medication is not prescribed in the [place]. Appears that this medication acts like a TCA. He reports having a great response to this medication. He he reports doing very well while taking this medication. However he ran out and was unable to obtain more medication on the Internet and this led to him relapsing on alcohol. The patient stated for the last 30 days he's been drinking "a moderate amount" however prior to admission he had consumed at least half a gallon of liquor. Patient states he does not remember much of what happened that day; he cannot explain why his urine toxicology screen was positive for barbiturates and benzodiazepines.    Patient describes issues with anxiety that appeared to be mainly related to social situations. He states he feels like people can see all of the insecurities in him.   Today he denies SI, HI or having auditory or visual hallucinations. Denies having any symptoms of mania or hypomania now or in the past. He does report depressive symptoms, such as feelings of inferiority, insecurity, sadness and insomnia.  Trauma history negative.  Substance abuse history patient reports past history of opiate and cannabis use. This has been in remission for many years.  Most recently the patient has been drinking heavily. Patient has been in inpatient residential treatment for alcoholism twice. [facility] and was discharged in June 2015 [facility] and the stated with them until June 2016. Patient reports that during the [facility] he was sober for 12 months and he did very well did not have any psychotic symptoms or major issues with depression. His longest sobriety outside treatment was 3 years.  Per records patient also has a history of amphetamine abuse. He reports that he was prescribed by Dr. Omelia Blackwater with Adderall which she used to abuse.   Associated Signs/Symptoms: Depression Symptoms:  depressed mood, insomnia, disturbed sleep, (Hypo) Manic Symptoms:  none Anxiety Symptoms:  Social Anxiety, Psychotic Symptoms:  Paranoia, PTSD Symptoms: Negative Total Time spent with patient: 1 hour  Past Psychiatric History: Patient has been hospitalized for mental illness and substance abuse at least 4 times in the past. Most of these hospitalizations have taken place in [facility]. His last discharge the patient was diagnosed with amphetamine abuse and amphetamine-induced.  Patient reports prior trials with Zoloft, Paxil, Wellbutrin, and trazodone, Effexor, and Tegretol which were not beneficial. Asian reported responding in the past and Neurontin and Elavil.  Patient is states that he did follow up with Dr. Omelia Blackwater but that he was not open or honest with him.  Risk to Self: Is patient at risk for suicide?: No Risk to Others:   no Prior Inpatient Therapy:   yes Prior Outpatient Therapy:   yes  Alcohol Screening: Patient refused Alcohol Screening Tool: Yes 1. How often do you have a drink containing alcohol?: 4 or more times a week 2. How many drinks containing alcohol do you have on a typical day when you are drinking?: 10 or more 3. How often do you have six or more drinks on one occasion?: Weekly Preliminary Score: 7 4. How often during the last year have you found that you were not able to stop drinking once you had started?: Never 5. How often during the last year have you failed to  do what was normally expected from you becasue of drinking?: Never 6. How often during the last year have you needed a first drink in the morning to get yourself going after a heavy drinking session?: Never 7. How often during the last year have you had a feeling of guilt of remorse after drinking?: Never 8. How often during the last year have you been unable to remember what happened the night before because you had been drinking?: Never 9. Have you or someone else been injured as a result of your drinking?: No 10. Has a relative or friend or a doctor or another health worker been concerned about your drinking or suggested you cut down?: Yes, during the last year Alcohol Use Disorder Identification Test Final Score (AUDIT): 15 Brief Intervention: Patient declined brief intervention Substance Abuse History in the last 12 months:  Yes.   Consequences of Substance Abuse: Medical Consequences:  Worsening depression Family Consequences:  Altercation with wife Blackouts:   Withdrawal Symptoms:   Diaphoresis Previous Psychotropic Medications: Yes  Psychological Evaluations: No   Past Medical History: Patient carries a diagnosis of rheumatoid colitis for which he has been prescribed with prednisone 20 mg. The patient states he just to see a rheumatologist in [facility] but because he moved to [place] he was not  illegible to return to the community clinic. Recently Dr. Omelia Blackwater his psychiatrist is the one that has been prescribing the prednisone for him Past Medical History  Diagnosis Date  . Bipolar 1 disorder (HCC)   . Schizophrenia (HCC)   . Depression   . Anxiety   . Rheumatoid arthritis (HCC)   . Hypertension   . Schizoaffective disorder, depressive type (HCC) 10/06/2015  . Alcohol use disorder, moderate, dependence (HCC) 02/13/2014   History reviewed. No pertinent past surgical history.  Family History: History reviewed. No pertinent family history.   Family Psychiatric  History: Patient  reports that he is grandfather on his mother's side suffered from schizophrenia. He reports that he is mother and her sisters suffer from bipolar and depression. He reports that his father suffers from alcoholism  Social History: Patient states that last week  prior to admission, he lost his place to stay and  his job. 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 They got into an argument last weekend and therefore the patient lost his job and place to stay. He is now homeless patient states in the past he has worked as an Personnel officer, in  Advice worker. As far as he is legal history he reports past charges of domestic violence and charges for driving with a revoked license. He is single never married and doesn't have any children. History  Alcohol Use  . Yes     History  Drug Use  . Yes  . Special: Amphetamines    Comment: THC    Social History   Social History  . Marital Status: Single    Spouse Name: N/A  . Number of Children: N/A  . Years of Education: N/A   Social History Main Topics  . Smoking status: Former Smoker -- 0.00 packs/day for 0 years  . Smokeless tobacco: None  . Alcohol Use: Yes  . Drug Use: Yes    Special: Amphetamines     Comment: THC  . Sexual Activity: Yes    Birth Control/ Protection: None   Other Topics Concern  . None   Social History Narrative    Allergies:   Allergies  Allergen Reactions  . Sulfa Antibiotics Other (See Comments)    Unknown reaction   Lab Results:  Results for orders placed or performed during the hospital encounter of 10/05/15 (from the past 48 hour(s))  Thyroid Panel With TSH     Status: None   Collection Time: 10/07/15  6:09 PM  Result Value Ref Range   TSH 4.040 0.450 - 4.500 uIU/mL   T4, Total 6.6 4.5 - 12.0 ug/dL   T3 Uptake Ratio 33 24 - 39 %   Free Thyroxine Index 2.2 1.2 - 4.9    Comment: (NOTE) Performed At: [facility] [address] [place], [place] [id number] Mila Homer MD  MO:[id number]     Metabolic Disorder Labs:  No results found for: HGBA1C, MPG No results found for: PROLACTIN No results found for: CHOL, TRIG, HDL, CHOLHDL, VLDL, LDLCALC  Current Medications: Current Facility-Administered Medications  Medication Dose Route Frequency Provider Last Rate Last Dose  . acetaminophen (TYLENOL) tablet 650 mg  650 mg Oral Q4H PRN Charm Rings, NP   650 mg at 10/07/15 1738  . alum & mag hydroxide-simeth (MAALOX/MYLANTA) 200-200-20 MG/5ML suspension 30 mL  30 mL Oral Q4H PRN Jimmy Footman, MD      . amitriptyline (ELAVIL) tablet 100 mg  100 mg Oral QHS Jimmy Footman, MD      .  amLODipine (NORVASC) tablet 2.5 mg  2.5 mg Oral Daily Jimmy Footman, MD   2.5 mg at 10/08/15 1012  . chlordiazePOXIDE (LIBRIUM) capsule 50 mg  50 mg Oral TID Jimmy Footman, MD      . cloNIDine (CATAPRES) tablet 0.1 mg  0.1 mg Oral TID PRN Jimmy Footman, MD      . ibuprofen (ADVIL,MOTRIN) tablet 600 mg  600 mg Oral Q8H PRN Charm Rings, NP      . magnesium hydroxide (MILK OF MAGNESIA) suspension 30 mL  30 mL Oral Daily PRN Jimmy Footman, MD      . ondansetron [facility]) tablet 4 mg  4 mg Oral Q8H PRN Charm Rings, NP      . predniSONE (DELTASONE) tablet 20 mg  20 mg Oral Q breakfast Jimmy Footman, MD   20 mg at 10/08/15 1438  . risperiDONE (RISPERDAL) tablet 0.5 mg  0.5 mg Oral BID Jimmy Footman, MD   0.5 mg at 10/08/15 1439   PTA Medications: Prescriptions prior to admission  Medication Sig Dispense Refill Last Dose  . amitriptyline (ELAVIL) 50 MG tablet Take 1 tablet (50 mg total) by mouth at bedtime. 30 tablet 0 Unknown at Unknown time  . QUEtiapine (SEROQUEL) 50 MG tablet Take 1 tablet (50 mg total) by mouth 3 (three) times daily. 90 tablet 0 Unknown at Unknown time  . zolpidem (AMBIEN) 5 MG tablet Take 1 tablet (5 mg total) by mouth at bedtime as needed for sleep. 30 tablet 0 Unknown at  Unknown time  . hydrOXYzine (ATARAX/VISTARIL) 25 MG tablet Take 1 tablet (25 mg total) by mouth every 4 (four) hours as needed for anxiety (Sleep). (Patient not taking: Reported on 06/25/2015) 120 tablet 0 Not Taking at Unknown time  . predniSONE (DELTASONE) 20 MG tablet Take 1 tablet (20 mg total) by mouth daily with breakfast. (Patient not taking: Reported on 10/08/2015) 30 tablet 0 Not Taking at Unknown time    Musculoskeletal: Strength & Muscle Tone: within normal limits Gait & Station: normal Patient leans: N/A  Psychiatric Specialty Exam: Physical Exam  Constitutional: He is oriented to person, place, and time. He appears well-developed and well-nourished.  HENT:  Head: Normocephalic and atraumatic.  Eyes: Conjunctivae are normal.  Neck: Normal range of motion.  Respiratory: Effort normal.  Musculoskeletal: Normal range of motion.  Neurological: He is alert and oriented to person, place, and time.  Skin: Skin is warm and dry.  Psychiatric: His behavior is normal. Judgment and thought content normal.    Review of Systems  Constitutional: Negative.   HENT: Negative.   Eyes: Negative.   Respiratory: Negative.   Cardiovascular: Negative.   Gastrointestinal: Negative.   Genitourinary: Negative.   Musculoskeletal: Negative.   Neurological: Negative.   Endo/Heme/Allergies: Negative.   Psychiatric/Behavioral: Positive for depression and substance abuse. Negative for suicidal ideas, hallucinations and memory loss. The patient is nervous/anxious and has insomnia.     Blood pressure 138/89, pulse 82, temperature 98 F (36.7 C), temperature source Oral, resp. rate 20, height  (1.88 m), weight 86.637 kg (191 lb), SpO2 100 %.Body mass index is 24.51 kg/(m^2).  General Appearance: Well Groomed  Patent attorney::  Good  Speech:  Clear and Coherent  Volume:  Normal  Mood:  Anxious and Dysphoric  Affect:  Congruent  Thought Process:  Linear  Orientation:  Full (Time, Place, and  Person)  Thought Content:  Hallucinations: None  Suicidal Thoughts:  No  Homicidal Thoughts:  No  Memory:  Immediate;   Good Recent;   Good Remote;   Good  Judgement:  Fair  Insight:  Fair  Psychomotor Activity:  Normal  Concentration:  Good  Recall:  Good  Fund of Knowledge:Good  Language: Good  Akathisia:  No  Handed:    AIMS (if indicated):     Assets:  Communication Skills Desire for Improvement Physical Health Social Support Vocational/Educational  ADL's:  Intact  Cognition: WNL  Sleep:  Number of Hours: 7.25     Treatment Plan Summary: Daily contact with patient to assess and evaluate symptoms and progress in treatment and Medication management   44 year old Caucasian male with history of alcohol, amphetamine and opiate abuse along with depression and anxiety presents to our emergency department intoxicated with an alcohol level of 258, and a urine toxicology positive for benzodiazepines and barbiturates. Patient was admitted to behavioral health as he had attempted to jump out of a moving vehicle and family have reported bizarre behavior.  Major depressive disorder: Patient reports a long history of depression that has responded to the treatment with a TCA which he has been purchasing on the Internet as this medication is not prescribed in the [place]. The patient states he relapsed on alcohol after running out of this medication a month ago. He reports a prior positive response to Elavil and therefore we discussed the possibility of restarting this medication today. Patient has received education about the risk of TCA in overdose and the risk of combining a TCA with alcohol.  Patient agrees with starting Elavil 100 mg by mouth daily at bedtime.  Insomnia: Patient reports chronic issues with insomnia. Hopefully by starting the Elavil tonight the patient will be able to sleep better.  Mild paranoia: I suspect paranoia secondary to substance use. I suspect patient has  been purchasing other agents on the Internet or street which could explain why he was positive for benzos and barbiturates.  For now the patient will be started on Risperdal 0.5 mg by mouth twice a day.  Alcohol withdrawal: Continue Librium taper. Today patient on 50 mg of Librium 3 times a day. CIWA scores are low. Will change vital signs to daily. Will discontinue prn Ativan, will discontinue CIWA.  Hypertension: Patient has been is started on Norvasc 2.5 mg by mouth daily. Diet has been order to be low sodium.  Nausea: Continue Zofran when necessary  Rheumatoid arthritis: Patient will be started on prednisone 20 mg by mouth daily  Precautions every 15 minute checks along with withdrawal precautions  Hospitalization and status continue involuntary commitment  Discharge disposition: Unclear at this point. Patient is open to the idea of being referred to a halfway house. He also will talk to his friends and asked him if he can stay with them after discharge.  Approximately length of the stay 3-4 days.    I certify that inpatient services furnished can reasonably be expected to improve the patient's condition.   Jimmy Footman 10/11/20162:55 PM

## 2015-10-08 NOTE — Progress Notes (Signed)
Recreation Therapy Notes  INPATIENT RECREATION THERAPY ASSESSMENT  Patient Details Name: Curtis Cobb MRN: [id number] DOB: 09-21-1971 Today's Date: 10/08/2015  Patient Stressors: Family, Death, Other (Comment) ("Not much family"; mother passed away in 2011-05-11, but has not processed her death; finances)  Coping Skills:   Isolate, Substance Abuse, Avoidance, Exercise, Talking, Music  Personal Challenges: Anger, Communication, Concentration, Decision-Making, Expressing Yourself, Problem-Solving, Relationships, Self-Esteem/Confidence, Social Interaction, Stress Management, Substance Abuse, Time Management, Trusting Others  Leisure Interests (2+):  Individual - Other (Comment) (Traveling, white water rafting)  Awareness of Community Resources:  No  Community Resources:     Current Use:    If no, Barriers?:    Patient Strengths:  "Right now no"; personality  Patient Identified Areas of Improvement:  Engagement and participation in life, self-improvement, helping others  Current Recreation Participation:  Nothing recently diagnosed with rheumatoid arthritis and has pain  Patient Goal for Hospitalization:  To get a proper diagnosis, righ tmedication, and reasonable exit plan  [place] of Residence:  [place] of Residence:  [place]   Current SI (including self-harm):  No  Current HI:  No  Consent to Intern Participation: N/A   Jacquelynn Cree, LRT/CTRS 10/08/2015, 2:16 PM

## 2015-10-09 MED ORDER — ENSURE ENLIVE PO LIQD
237.0000 mL | Freq: Two times a day (BID) | ORAL | Status: DC
  Administered 2015-10-09 – 2015-10-12 (×5): 237 mL via ORAL

## 2015-10-09 MED ORDER — AMLODIPINE BESYLATE 5 MG PO TABS: 5.0000 mg | ORAL_TABLET | Freq: Every day | ORAL | Status: DC

## 2015-10-09 MED ORDER — CHLORDIAZEPOXIDE HCL 25 MG PO CAPS
25.0000 mg | ORAL_CAPSULE | Freq: Three times a day (TID) | ORAL | Status: DC
  Filled 2015-10-09: qty 1

## 2015-10-09 MED ORDER — AMLODIPINE BESYLATE 5 MG PO TABS
2.5000 mg | ORAL_TABLET | Freq: Every day | ORAL | Status: DC
  Administered 2015-10-10 – 2015-10-12 (×3): 2.5 mg via ORAL
  Filled 2015-10-09: qty 0.5
  Filled 2015-10-09 (×2): qty 1

## 2015-10-09 MED ORDER — CHLORDIAZEPOXIDE HCL 25 MG PO CAPS: 25.0000 mg | ORAL_CAPSULE | Freq: Four times a day (QID) | ORAL | Status: DC

## 2015-10-09 NOTE — BHH Group Notes (Signed)
B[facility] Group Notes:  (Nursing/MHT/Case Management/Adjunct)  Date:  10/09/2015  Time:  10:48 PM  Type of Therapy:  Group Therapy  Participation Level:  Active  Participation Quality:  Appropriate  Affect:  Appropriate  Cognitive:  Appropriate  Insight:  Appropriate  Engagement in Group:  Engaged  Modes of Intervention:  Discussion  Summary of Progress/Problems:  Curtis Cobb 10/09/2015, 10:48 PM

## 2015-10-09 NOTE — BHH Group Notes (Signed)
[facility] LCSW Aftercare Discharge Planning Group Note   10/09/2015 10:36 AM  Participation Quality:  Patient attended group, introduced himself, and shared that his SMART goal is to "get to a stable place to make more informed decisions". Patient became anxious when CSW shared that average LOS on BMU is 3-5 days and shared that he feels it will take longer for him to stabilize on his medications. Patient received supportive feedback from other group members stating that this was an average and CSW explained that the intent is to not have patient's waiting till last minute to make a plan for issues that need to be met and are known early on during admission such as housing, transportation, and legal. Patient left group 10 minutes early and did not return.   Mood/Affect:  Anxious  Thoughts of Suicide:  No Will you contract for safety?   NA  Current AVH:  No  Plan for Discharge/Comments:  Patient is currently homeless and does not want [facility] but is working with a friend on getting into a boarding house.   Supports: Patient has a friend that is supportive but will not allow patient to live with her.  Keene Breath, MSW, LCSWA

## 2015-10-09 NOTE — Plan of Care (Signed)
Problem: [facility] Participation in Recreation Therapeutic Interventions Goal: STG-Patient will demonstrate improved self esteem by identif STG: Self-Esteem - Within 4 treatment sessions, patient will verbalize at least 5 positive affirmation statements in each of 2 treatment sessions to increase self-esteem post d/c.  Outcome: Not Progressing Treatment Session 1; Completed 0 out of 2: At approximately 12:45 pm, LRT attempted treatment session. Patient refused stating he felt 10 times worse than when he was admitted.  Jacquelynn Cree, LRT/CTRS 10.12.16 2:38 pm Goal: STG-Patient will identify at least five coping skills for ** STG: Coping Skills - Within 4 treatment sessions, patient will verbalize at least 5 coping skills for substance abuse in each of 2 treatment sessions to decrease substance abuse post d/c.  Outcome: Not Progressing Treatment Session 1; Completed 0 out of 2: At approximately 12:45 pm, LRT attempted treatment session. Patient refused stating he felt 10 times worse than when he was admitted.  Jacquelynn Cree, LRT/CTRS 10.12.16 2:38 pm    Goal: STG-Other Recreation Therapy Goal (Specify) STG: Stress Management - Within 4 treatment sessions, patient will verbalize understanding of the stress management techniques in each of 2 treatment sessions to increase stress management skills post d/c.  Outcome: Not Progressing Treatment Session 1; Completed 0 out of 2: At approximately 12:45 pm, LRT attempted treatment session. Patient refused stating he felt 10 times worse than when he was admitted.  Jacquelynn Cree, LRT/CTRS 10.12.16 2:38 pm

## 2015-10-09 NOTE — Progress Notes (Signed)
[facility] MD Progress Note  10/09/2015 9:14 AM Curtis Cobb  MRN:  [id number] Subjective:  Today the patient reported not been able to really thinking clearly or focus. He feels pressure in trying to find a place for him to go that he cannot really thinking clearly right now. He reported that groups are making him feel worse as he feels that he needs to flee out of the room due to anxiety, he feels that everybody is looking at him and focus on him.  Patient denies symptoms of alcohol withdrawal today. Does report severe anxiety and feeling overwhelmed, depressed. He denies SI, HI or auditory or visual hallucinations.  Patient was able to sleep better last night with the Elavil. Appetite is fair, energy is low, concentration is poor.    Per nursing: Patient was pleasant this shift. Patient denies any SI or HI at this time. Patient states he is not sleeping well at night. Patients appetite has been good, energy level has been appropriate. Patient states that his depression is a 7 on a scale from 1 to 10. Patient states that he is experiencing hopelessness with rating it a 4 on a scale from 1 to 10. Patients anxiety level is at a 5 this shift. Patient denies any auditory or visual hallucinations at this time. Patient is experiencing pain in his joints. Patient appears flat. Patient is seen in the milieu several times and has attended groups this shift. Patient in no distress at this time.  A: support and encouragement provided and given. Medications given as prescribed. Patient compliant with medications. q 15 min checks.  R: patient receptive of care provided.   Principal Problem: Major depressive disorder, recurrent, moderate (HCC) Diagnosis:   Patient Active Problem List   Diagnosis Date Noted  . Opioid use disorder, mild, in sustained remission [F11.90] 10/08/2015  . Social phobia [F40.10] 10/08/2015  . 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 Major depressive disorder, recurrent, moderate (HCC) [F33.1] 10/08/2015  . Alcohol use disorder, severe, dependence (HCC) [F10.20] 10/07/2015  . Cannabis use disorder, severe, dependence (HCC) [F12.20] 10/07/2015  . 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 HTN (hypertension) [I10] 10/07/2015  . Rheumatoid arthritis (HCC) [M06.9] 10/07/2015   Total Time spent with patient: 30 minutes   History of Present Illness: Curtis Cobb is a 44 y.o. male patient with a h/o polysubstance use, major depression and ADHD who presents to [facility] ED with alcohol intoxication (presentation BAL 258) and getting into an altercation with his girlfriend. Pt's girlfriend called police and the pt picked her up and threatened to harm her. Per ED MD notes, the pt was brought to the ED by police for jumping out of a moving vehicle and alcohol intoxication. Patient's friends reported that he had not taken medication in 2 months. Friend noticed pt exhibited unusual behavior yesterday with "Missing segments of time." the pt shared with her he wanted to kill himself and he attempted to jump out of a moving car. She also notes he was drinking Vodka in her car. States he has exhibited threatening and paranoid and spoke of hurting others.   Patient was transferred from the emergency department to the [facility] behavioral health unit on October 10. He was started on a Librium taper to address alcohol withdrawals.  Today the patient tells me he started having issues with depression around the age of 53. Around the same time he started experimenting with substances in order to treat his depressive  symptoms. He is started abusing opiates and marijuana. However he states that he has not used any of these 2 drugs in several years. Patient states his main issue now is with alcohol. Patient has been in residential treatment twice in the past.   Patient states that he was purchasing a antidepressant on the Internet that is  called Stablon. This medication is not prescribed in the [place]. Appears that this medication acts like a TCA. He reports having a great response to this medication. He he reports doing very well while taking this medication. However he ran out and was unable to obtain more medication on the Internet and this led to him relapsing on alcohol. The patient stated for the last 30 days he's been drinking "a moderate amount" however prior to admission he had consumed at least half a gallon of liquor. Patient states he does not remember much of what happened that day; he cannot explain why his urine toxicology screen was positive for barbiturates and benzodiazepines.   Patient describes issues with anxiety that appeared to be mainly related to social situations. He states he feels like people can see all of the insecurities in him.   Today he denies SI, HI or having auditory or visual hallucinations. Denies having any symptoms of mania or hypomania now or in the past. He does report depressive symptoms, such as feelings of inferiority, insecurity, sadness and insomnia. Trauma history negative.  Substance abuse history patient reports past history of opiate and cannabis use. This has been in remission for many years. Most recently the patient has been drinking heavily. Patient has been in inpatient residential treatment for alcoholism twice. [facility] and was discharged in June 2015 [facility] and the stated with them until June 2016. Patient reports that during the [facility] he was sober for 12 months and he did very well did not have any psychotic symptoms or major issues with depression. His longest sobriety outside treatment was 3 years. Per records patient also has a history of amphetamine abuse. He reports that he was prescribed by Dr. Omelia Blackwater with Adderall which she used to abuse.   Associated  Signs/Symptoms: Depression Symptoms: depressed mood, insomnia, disturbed sleep, (Hypo) Manic Symptoms: none Anxiety Symptoms: Social Anxiety, Psychotic Symptoms: Paranoia, PTSD Symptoms: Negative Total Time spent with patient: 1 hour  Past Psychiatric History: Patient has been hospitalized for mental illness and substance abuse at least 4 times in the past. Most of these hospitalizations have taken place in [place] [facility]. His last discharge the patient was diagnosed with amphetamine abuse and amphetamine-induced.  Patient reports prior trials with Zoloft, Paxil, Wellbutrin, and trazodone, Effexor, and Tegretol which were not beneficial. Asian reported responding in the past and Neurontin and Elavil.  Patient is states that he did follow up with Dr. Omelia Blackwater but that he was not open or honest with him.  Risk to Self: Is patient at risk for suicide?: No Risk to Others:   no Prior Inpatient Therapy:   yes Prior Outpatient Therapy:   yes  Alcohol Screening: Patient refused Alcohol Screening Tool: Yes 1. How often do you have a drink containing alcohol?: 4 or more times a week 2. How many drinks containing alcohol do you have on a typical day when you are drinking?: 10 or more 3. How often do you have six or more drinks on one occasion?: Weekly  Preliminary Score: 7 4. How often during the last year have you found that you were not able to stop drinking once you had started?: Never 5. How often during the last year have you failed to do what was normally expected from you becasue of drinking?: Never 6. How often during the last year have you needed a first drink in the morning to get yourself going after a heavy drinking session?: Never 7. How often during the last year have you had a feeling of guilt of remorse after drinking?: Never 8. How often during the last year have you been unable to remember what happened the night before because you had been drinking?: Never 9.  Have you or someone else been injured as a result of your drinking?: No 10. Has a relative or friend or a doctor or another health worker been concerned about your drinking or suggested you cut down?: Yes, during the last year Alcohol Use Disorder Identification Test Final Score (AUDIT): 15 Brief Intervention: Patient declined brief intervention Substance Abuse History in the last 12 months: Yes.  Consequences of Substance Abuse: Medical Consequences: Worsening depression Family Consequences: Altercation with wife Blackouts:  Withdrawal Symptoms: Diaphoresis Previous Psychotropic Medications: Yes  Psychological Evaluations: No   Past Medical History: Patient carries a diagnosis of rheumatoid colitis for which he has been prescribed with prednisone 20 mg. The patient states he just to see a rheumatologist in [facility] but because he moved to [place] he was not illegible to return to the community clinic. Recently Dr. Omelia Blackwater his psychiatrist is the one that has been prescribing the prednisone for him Past Medical History  Diagnosis Date  . Bipolar 1 disorder (HCC)   . Schizophrenia (HCC)   . Depression   . Anxiety   . Rheumatoid arthritis (HCC)   . Hypertension   . Schizoaffective disorder, depressive type (HCC) 10/06/2015  . Alcohol use disorder, moderate, dependence (HCC) 02/13/2014   History reviewed. No pertinent past surgical history.  Family History: History reviewed. No pertinent family history.   Family Psychiatric History: Patient reports that he is grandfather on his mother's side suffered from schizophrenia. He reports that he is mother and her sisters suffer from bipolar and depression. He reports that his father suffers from alcoholism  Social History: Patient states that last week prior to admission, he lost his place to stay and his job. 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 They got into an argument last weekend and  therefore the patient lost his job and place to stay. He is now homeless patient states in the past he has worked as an Personnel officer, in Advice worker. As far as he is legal history he reports past charges of domestic violence and charges for driving with a revoked license. He is single never married and doesn't have any children. History  Alcohol Use  . Yes    History  Drug Use  . Yes  . Special: Amphetamines    Comment: THC    Social History   Social History  . Marital Status: Single    Spouse Name: N/A  . Number of Children: N/A  . Years of Education: N/A   Social History Main Topics  . Smoking status: Former Smoker -- 0.00 packs/day for 0 years  . Smokeless tobacco: None  . Alcohol Use: Yes  . Drug Use: Yes    Special: Amphetamines     Comment: THC  . Sexual Activity: Yes    Birth Control/  Protection: None   Other Topics Concern  . None         Sleep: Fair  Appetite:  Fair  Current Medications: Current Facility-Administered Medications  Medication Dose Route Frequency Provider Last Rate Last Dose  . acetaminophen (TYLENOL) tablet 650 mg  650 mg Oral Q4H PRN Charm Rings, NP   650 mg at 10/07/15 1738  . alum & mag hydroxide-simeth (MAALOX/MYLANTA) 200-200-20 MG/5ML suspension 30 mL  30 mL Oral Q4H PRN Jimmy Footman, MD      . amitriptyline (ELAVIL) tablet 100 mg  100 mg Oral QHS Jimmy Footman, MD   100 mg at 10/08/15 2133  . amLODipine (NORVASC) tablet 2.5 mg  2.5 mg Oral Daily Jimmy Footman, MD   2.5 mg at 10/09/15 0905  . chlordiazePOXIDE (LIBRIUM) capsule 50 mg  50 mg Oral TID Jimmy Footman, MD   50 mg at 10/09/15 0904  . feeding supplement (ENSURE ENLIVE) (ENSURE ENLIVE) liquid 237 mL  237 mL Oral BID BM Jimmy Footman, MD      . ibuprofen (ADVIL,MOTRIN) tablet 600 mg  600 mg Oral Q8H PRN Charm Rings, NP   600 mg  at 10/08/15 1612  . magnesium hydroxide (MILK OF MAGNESIA) suspension 30 mL  30 mL Oral Daily PRN Jimmy Footman, MD      . ondansetron [facility]) tablet 4 mg  4 mg Oral Q8H PRN Charm Rings, NP      . predniSONE (DELTASONE) tablet 20 mg  20 mg Oral Q breakfast Jimmy Footman, MD   20 mg at 10/09/15 0904  . risperiDONE (RISPERDAL) tablet 0.5 mg  0.5 mg Oral BID Jimmy Footman, MD   0.5 mg at 10/09/15 5465    Lab Results:  Results for orders placed or performed during the hospital encounter of 10/05/15 (from the past 48 hour(s))  Thyroid Panel With TSH     Status: None   Collection Time: 10/07/15  6:09 PM  Result Value Ref Range   TSH 4.040 0.450 - 4.500 uIU/mL   T4, Total 6.6 4.5 - 12.0 ug/dL   T3 Uptake Ratio 33 24 - 39 %   Free Thyroxine Index 2.2 1.2 - 4.9    Comment: (NOTE) Performed At: [facility] [address] [place], [place] [id number] Mila Homer MD EX:[id number]     Physical Findings: AIMS:  , ,  ,  ,    CIWA:  CIWA-Ar Total: 1 COWS:     Musculoskeletal: Strength & Muscle Tone: within normal limits Gait & Station: normal Patient leans: N/A  Psychiatric Specialty Exam: Review of Systems  Constitutional: Negative.   HENT: Negative.   Eyes: Negative.   Respiratory: Negative.   Cardiovascular: Negative.   Gastrointestinal: Negative.   Genitourinary: Negative.   Musculoskeletal: Negative.   Skin: Negative.   Neurological: Negative.   Endo/Heme/Allergies: Negative.   Psychiatric/Behavioral: Positive for depression and substance abuse. Negative for suicidal ideas, hallucinations and memory loss. The patient is nervous/anxious and has insomnia.     Blood pressure 151/103, pulse 106, temperature 98 F (36.7 C), temperature source Oral, resp. rate 20, height 6\' 2"  (1.88 m), weight 86.637 kg (191 lb), SpO2 100 %.Body mass index is 24.51 kg/(m^2).  General Appearance: Well Groomed  ::  Fair  Speech:  Clear  and Coherent  Volume:  Normal  Mood:  Anxious and Dysphoric  Affect:  Constricted  Thought Process:  Linear  Orientation:  Full (Time, Place, and Person)  Thought Content:  Hallucinations:  None  Suicidal Thoughts:  No  Homicidal Thoughts:  No  Memory:  Immediate;   Good Recent;   Good Remote;   Good  Judgement:  Fair  Insight:  Shallow  Psychomotor Activity:  Increased  Concentration:  Fair  Recall:  NA  Fund of Knowledge:Good  Language: Good  Akathisia:  No  Handed:    AIMS (if indicated):     Assets:  Communication Skills Desire for Improvement Physical Health  ADL's:  Intact  Cognition: WNL  Sleep:  Number of Hours: 7.5   Treatment Plan Summary: Daily contact with patient to assess and evaluate symptoms and progress in treatment and Medication management   44 year old Caucasian male with history of alcohol, amphetamine and opiate abuse along with depression and anxiety presents to our emergency department intoxicated with an alcohol level of 258, and a urine toxicology positive for benzodiazepines and barbiturates. Patient was admitted to behavioral health as he had attempted to jump out of a moving vehicle and family have reported bizarre behavior.  Major depressive disorder: Patient reports a long history of depression that has responded to the treatment with a TCA which he has been purchasing on the Internet as this medication is not prescribed in the [place]. The patient states he relapsed on alcohol after running out of this medication a month ago. He reports a prior positive response to Elavil and therefore we discussed the possibility of restarting this medication. Patient has received education about the risk of TCA in overdose and the risk of combining a TCA with alcohol. Continue Elavil 100 mg by mouth daily at bedtime  Insomnia: Patient reports chronic issues with insomnia. Patient is sleeping well since the started on Elavil 100 mg by mouth daily at  bedtime  Mild paranoia: I suspect paranoia secondary to substance use. I suspect patient has been purchasing other agents on the Internet or street which could explain why he was positive for benzos and barbiturates. For now the patient will be started on Risperdal 0.5 mg by mouth twice a day.  Alcohol withdrawal: Today I will decrease the Librium to 25 mg 3 times a day as patient reports feeling sedated.  Hypertension: Continue Norvasc 2.5 mg daily and low sodium diet.  Nausea: Continue Zofran when necessary  Rheumatoid arthritis: Continue prednisone 20 mg by mouth daily  Precautions every 15 minute checks along with withdrawal precautions  Hospitalization and status continue involuntary commitment  Discharge disposition: Unclear at this point. Patient is open to the idea of being referred to a halfway house. He also will talk to his friends and asked him if he can stay with them after discharge. Patient still has not made decisions about his disposition. He feels pressured and overwhelmed at this time and does not want to cooperate with discharge planning today.  Approximately length of the stay 3 days.  Jimmy Footman 10/09/2015, 9:14 AM

## 2015-10-09 NOTE — Progress Notes (Signed)
Recreation Therapy Notes  Date: 10.12.16 Time: 3:00 pm Location: Craft Room  Group Topic: Self-Esteem  Goal Area(s) Addresses:  Patient will write at least one positive trait about self. Patient will verbalize benefit of having a healthy self-esteem.  Behavioral Response: Attentive, Interactive  Intervention: I Am  Activity: Patients were given a worksheet with the letter I on it and instructed to fill it with positive traits about themselves.  Education: LRT educated patients on ways they can increase their self-esteem.  Education Outcome: Acknowledges education/In group clarification offered  Clinical Observations/Feedback: Patient completed activity by writing positive traits. Patient contributed to group discussion by stating why it is difficult to think of positive traits, how it felt to see positive traits listed, how his self-esteem affects him, and how he can increase his self-esteem.  Jacquelynn Cree, LRT/CTRS 10/09/2015 4:29 PM

## 2015-10-09 NOTE — Progress Notes (Addendum)
Patient with sad affect, withdrawn behavior. No SI/HI at this time. Patient states he is not ready for discharge at that he is looking for a boarding room house to discharge to. Good adls, good appetite. Eats meals in dayroom, does not attend therapy groups. Minimal interaction with peers. Verbalizes needs appropriately with staff. Refuses Librium dose x 2, denies s/s of withdrawal. Safety maintained.

## 2015-10-09 NOTE — Progress Notes (Signed)
Patient with depressed affect and withdrawn behavior. Fair adls, good appetite. Patient to get Ensure shakes rt weight loss over last month. Patient irritable stating he is not ready to discharge, states he would like to find placement at boarding home. Attending therapy groups to learn and initiate coping skills for management of stressors and diagnosis. No SI/HI at this time. VS monitored and recorded. Safety maintained.

## 2015-10-09 NOTE — [facility] Group Notes (Signed)
[facility] Group Notes:  (Nursing/MHT/Case Management/Adjunct)  Date:  10/09/2015  Time:  4:02 AM  Type of Therapy:  Group Therapy  Participation Level:  Did Not Attend      Summary of Progress/Problems:  Veva Holes 10/09/2015, 4:02 AM

## 2015-10-09 NOTE — Progress Notes (Signed)
Patient refuses Librium this evening. No s/s of withdrawal.

## 2015-10-09 NOTE — Progress Notes (Signed)
Patient refusing Librium this afternoon. No s/s of withdrawal at this time.

## 2015-10-09 NOTE — Progress Notes (Addendum)
D: Patient denies SI/HI/AVH. Patient affect and mood are anxious.  Patient did NOT attend evening group. Patient visible on the milieu. No distress noted. A: Support and encouragement offered. Scheduled medications given to pt. Q 15 min checks continued for patient safety. R: Patient receptive. Patient remains safe on the unit.   

## 2015-10-09 NOTE — [facility] Counselor (Signed)
Adult Comprehensive Assessment  Patient ID: Curtis Cobb, male   DOB: 06/09/1971, 44 y.o.   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 / Lack of resources (include bankruptcy): [employer] / Lack of housing: homeless Substance abuse: alcohol abuse after being sober for 9mos  Living/Environment/Situation:  Living Arrangements: Other (Comment) (homeless) Living conditions (as described by patient or guardian): lost housing when I quit job as my emplyer is my landlord What is atmosphere in current home: Chaotic  Family History:  Marital status: Single  Childhood History:     Education:  Highest grade of school patient has completed: NA Currently a student?: No Name of school: NA Learning disability?: No  Employment/Work Situation:   Employment situation: Unemployed Patient's job has been impacted by current illness: Yes Describe how patient's job has been impacted: patient quit taking over counter antidepressant and relapsed losing job and Engineer, site:   Financial resources: No income Does patient have a Lawyer or guardian?: No  Alcohol/Substance Abuse:   What has been your use of drugs/alcohol within the last 12 months?: alcohol abuse Alcohol/Substance Abuse Treatment Hx: Past Tx, Outpatient, Past Tx, Inpatient  Social Support System:   Patient's Community Support System: Poor  Leisure/Recreation:      Strengths/Needs:      Discharge Plan:   Will patient be returning to same living situation after discharge?: No Plan for living situation after discharge: Patient is given shelter list, boarding house list, and [place] list, [place] plans to call his friend who will help him find boarding house Currently receiving community mental health services: No If no, would patient like referral for services when discharged?: Yes ([place]) Does patient have financial barriers related to discharge medications?: Yes Patient description of barriers related to discharge medications: patient is uninsured and will need [facility]  Summary/Recommendations:  Patient is a single 44 yo wm admitted for SI and poly substance use. Patient is not interested in inpatient substance use treatment but wants to find a place to live and get back on his medication he was taking. 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 Patient also reports he was ordering an antidepressant Stablon he ordered through the mail which patient reports works well for him. Patient is given shelter list, boarding house list, and [place] list. Patient plans to work with his friend who is helping him find a boarding house. Patient will stabilize on meds and will need mental health follow up and medication management application referral as patient is uninsured and plans to stay in [place] as this is where he grew up. Patient is encouraged to participate in group therapy, medication management, and therapeutic milieu.     Lulu Riding., MSW, Theresia Majors  10/09/2015

## 2015-10-09 NOTE — [facility] Group Notes (Signed)
[facility] LCSW Group Therapy  10/09/2015 3:00 PM  Type of Therapy:  Group Therapy  Participation Level:  Did Not Attend  Modes of Intervention:  Discussion, Education, Socialization and Support  Summary of Progress/Problems: Emotional Regulation: Patients will identify both negative and positive emotions. They will discuss emotions they have difficulty regulating and how they impact their lives. Patients will be asked to identify healthy coping skills to combat unhealthy reactions to negative emotions.     Winona Sison L Makynzie Dobesh MSW, LCSWA  10/09/2015, 3:00 PM  

## 2015-10-10 MED ORDER — CHLORDIAZEPOXIDE HCL 10 MG PO CAPS: 10.0000 mg | ORAL_CAPSULE | Freq: Three times a day (TID) | ORAL | Status: DC

## 2015-10-10 MED ORDER — GABAPENTIN 100 MG PO CAPS
100.0000 mg | ORAL_CAPSULE | Freq: Three times a day (TID) | ORAL | Status: DC
  Administered 2015-10-10 – 2015-10-12 (×7): 100 mg via ORAL
  Filled 2015-10-10 (×7): qty 1

## 2015-10-10 NOTE — Plan of Care (Signed)
Problem: [facility] Participation in Recreation Therapeutic Interventions Goal: STG-Patient will demonstrate improved self esteem by identif STG: Self-Esteem - Within 4 treatment sessions, patient will verbalize at least 5 positive affirmation statements in each of 2 treatment sessions to increase self-esteem post d/c.  Outcome: Progressing Treatment Session 2; Completed 1 out of 2: At approximately 10:10 am, LRT met with patient in patient room. Patient frustrated because he feels that the other staff are not listening to him. Patient verbalized 5 positive affirmation statements and reported it felt like a lie. LRT encouraged patient to continue saying positive affirmation statements.  Leonette Monarch, LRT/CTRS 10.13.16 4:58 pm Goal: STG-Patient will identify at least five coping skills for ** STG: Coping Skills - Within 4 treatment sessions, patient will verbalize at least 5 coping skills for substance abuse in each of 2 treatment sessions to decrease substance abuse post d/c.  Outcome: Progressing Treatment Session 2; Completed 1 out of 2: At approximately 10:10 am, LRT met with patient in patient room. Patient verbalized 5 coping skills for substance abuse. LRT educated patient on leisure and why it is important to implement it into his schedule. LRT educated and provided patient with blank schedules to help him plan his day and try to avoid using substances. LRT educated patient on healthy support systems.  Leonette Monarch, LRT/CTRS 10.13.16 5:00 pm Goal: STG-Other Recreation Therapy Goal (Specify) STG: Stress Management - Within 4 treatment sessions, patient will verbalize understanding of the stress management techniques in each of 2 treatment sessions to increase stress management skills post d/c.  Outcome: Progressing Treatment Session 2; Completed 1 out of 2: At approximately 10:10 am, LRT met with patient in patient room. LRT educated and provided patient with handouts on stress  management techniques. Patient verbalized understanding. LRT encouraged patient to read over and practice the stress management techniques.  Leonette Monarch, LRT/CTRS 10.13.16 5:01 pm

## 2015-10-10 NOTE — Progress Notes (Signed)
Patient with depressed affect, cooperative behavior with meals, meds, plan of care. No SI/HI at this time. Good adls, good appetite. Quiet with peers, appropriate when staff initiates interactions. Fair insight, poor coping sklls. Safety maintained.

## 2015-10-10 NOTE — Plan of Care (Signed)
Problem: Diagnosis: Increased Risk For Suicide Attempt Goal: STG-Patient Will Report Suicidal Feelings to Staff Outcome: Progressing Patient denies SI/HI.      

## 2015-10-10 NOTE — [facility] Group Notes (Signed)
[facility] Group Notes:  (Nursing/MHT/Case Management/Adjunct)  Date:  10/10/2015  Time:  2:31 PM  Type of Therapy:  Group Therapy  Participation Level:  Did Not Attend  Participation Quality: Summary of Progress/Problems:  Curtis Cobb 10/10/2015, 2:31 PM

## 2015-10-10 NOTE — Progress Notes (Signed)
Recreation Therapy Notes  Date: 10.13.16 Time: 3:00 pm Location: Craft Room  Group Topic: Leisure Education   Goal Area(s) Addresses:  Patient will identify activities for each letter of the alphabet. Patient will verbalize ability to integrate positive leisure into life post d/c. Patient will verbalize ability to use leisure as a Associate Professor.  Behavioral Response: Did not attend   Intervention: Leisure Alphabet  Activity: Patients were given a leisure alphabet worksheet and instructed to write a healthy leisure activity for each letter of the alphabet.  Education: LRT educated patients on what they needed to participate in leisure.  Education Outcome: Patient did not attend group.   Clinical Observations/Feedback: Patient did not attend group.  Jacquelynn Cree, LRT/CTRS 10/10/2015 4:38 PM

## 2015-10-10 NOTE — Progress Notes (Signed)
D: Pt denies SI/HI/AVH. Pt is pleasant and cooperative, affect is flat and sad but brightens upon approach. Pt stated he feels better today,he appears less anxious and he is interacting with peers and staff appropriately.  A: Pt was offered support and encouragement. Pt was given scheduled medications. Pt was encouraged to attend groups. Q 15 minute checks were done for safety.  R:Pt attends groups and interacts well with peers and staff. Pt is taking medication. Pt has no complaints.Pt receptive to treatment and safety maintained on unit.

## 2015-10-10 NOTE — Progress Notes (Signed)
D: Pt denies SI/HI/AVH. Pt is pleasant and cooperative. Patient appears less anxious and he is interacting with peers and staff appropriately.  A: Pt was offered support and encouragement. Pt was given scheduled medications. Pt was encouraged to attend groups. Q 15 minute checks were done for safety.  R:Pt attend group and interacts well with peers and staff. Pt is taking medication. Pt receptive to treatment and safety maintained on unit.

## 2015-10-10 NOTE — Plan of Care (Signed)
Problem: Diagnosis: Increased Risk For Suicide Attempt Goal: LTG-Patient Will Report Improved Mood and Deny Suicidal LTG (by discharge) Patient will report improved mood and deny suicidal ideation.  Outcome: Progressing Patient denies SI/HI, 15 checks maintained.

## 2015-10-10 NOTE — Progress Notes (Signed)
[facility] MD Progress Note  10/10/2015 1:41 PM [name]  MRN:  [id number] Subjective: Patient continues to state withdrawn in his room. He is not attending any groups.  He continues to report feeling very anxious and "paranoid" he feels that people are all fixated on him when he is in group. He feels better if he just stays in his room.  He continues to feel very depressed, and describes having lack of motivation, drowsiness, lack of energy and poor concentration. He feels overwhelmed as he is not sure of what can happen with him when he gets discharged. He feels angry and frustrated with himself for bringing all these problems on himself.  Denies SI, HI or having auditory or visual hallucinations.  As far as side effects the patient continues to report feeling sedated. He tells me that he has been refusing delivery and since yesterday.  He requested to be started on Neurontin which was very beneficial for him in a prior hospitalization. He would like to take the Neurontin along with the Elavil. Says he spoke with the social worker this morning and they are trying to get him into a halfway house at discharge. He says that a close friend of him has offered him a job and has requested for him to have urine toxicology screen weekly and breathalyzer daily as a requirement in order to keep his job.  Per nursing: D: Pt denies SI/HI/AVH. Pt is pleasant and cooperative. Patient appears less anxious and he is interacting with peers and staff appropriately.  A: Pt was offered support and encouragement. Pt was given scheduled medications. Pt was encouraged to attend groups. Q 15 minute checks were done for safety.  R:Pt attend group and interacts well with peers and staff. Pt is taking medication. Pt receptive to treatment and safety maintained on unit.   Principal Problem: Major depressive disorder, recurrent, moderate (HCC) Diagnosis:   Patient Active Problem List   Diagnosis Date Noted  . Opioid use  disorder, mild, in sustained remission [F11.90] 10/08/2015  . Social phobia [F40.10] 10/08/2015  . 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 Major depressive disorder, recurrent, moderate (HCC) [F33.1] 10/08/2015  . Alcohol use disorder, severe, dependence (HCC) [F10.20] 10/07/2015  . Cannabis use disorder, severe, dependence (HCC) [F12.20] 10/07/2015  . 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 HTN (hypertension) [I10] 10/07/2015  . Rheumatoid arthritis (HCC) [M06.9] 10/07/2015   Total Time spent with patient: 30 minutes   History of Present Illness: Curtis Cobb is a 44 y.o. male patient with a h/o polysubstance use, major depression and ADHD who presents to [facility] ED with alcohol intoxication (presentation BAL 258) and getting into an altercation with his girlfriend. Pt's girlfriend called police and the pt picked her up and threatened to harm her. Per ED MD notes, the pt was brought to the ED by police for jumping out of a moving vehicle and alcohol intoxication. Patient's friends reported that he had not taken medication in 2 months. Friend noticed pt exhibited unusual behavior yesterday with "Missing segments of time." the pt shared with her he wanted to kill himself and he attempted to jump out of a moving car. She also notes he was drinking Vodka in her car. States he has exhibited threatening and paranoid and spoke of hurting others.   Patient was transferred from the emergency department to the [facility] behavioral health unit on October 10. He was started on a Librium taper to address alcohol withdrawals.  Today the patient tells me he started having issues with depression around the age of 4. Around the same time he started experimenting with substances in order to treat his depressive symptoms. He is started abusing opiates and marijuana. However he states that he has not used any of these 2 drugs in several years. Patient states  his main issue now is with alcohol. Patient has been in residential treatment twice in the past.   Patient states that he was purchasing a antidepressant on the Internet that is called Stablon. This medication is not prescribed in the [place]. Appears that this medication acts like a TCA. He reports having a great response to this medication. He he reports doing very well while taking this medication. However he ran out and was unable to obtain more medication on the Internet and this led to him relapsing on alcohol. The patient stated for the last 30 days he's been drinking "a moderate amount" however prior to admission he had consumed at least half a gallon of liquor. Patient states he does not remember much of what happened that day; he cannot explain why his urine toxicology screen was positive for barbiturates and benzodiazepines.   Patient describes issues with anxiety that appeared to be mainly related to social situations. He states he feels like people can see all of the insecurities in him.   Today he denies SI, HI or having auditory or visual hallucinations. Denies having any symptoms of mania or hypomania now or in the past. He does report depressive symptoms, such as feelings of inferiority, insecurity, sadness and insomnia. Trauma history negative.  Substance abuse history patient reports past history of opiate and cannabis use. This has been in remission for many years. Most recently the patient has been drinking heavily. Patient has been in inpatient residential treatment for alcoholism twice. [facility] and was discharged in June 2015 [facility] and the stated with them until June 2016. Patient reports that during the [facility] he was sober for 12 months and he did very well did not have any psychotic symptoms or major issues with depression. His longest sobriety outside treatment was 3 years.  Per records patient also has a history of amphetamine abuse. He reports that he was prescribed by Dr. Omelia Blackwater with Adderall which she used to abuse.   Associated Signs/Symptoms: Depression Symptoms: depressed mood, insomnia, disturbed sleep, (Hypo) Manic Symptoms: none Anxiety Symptoms: Social Anxiety, Psychotic Symptoms: Paranoia, PTSD Symptoms: Negative Total Time spent with patient: 1 hour  Past Psychiatric History: Patient has been hospitalized for mental illness and substance abuse at least 4 times in the past. Most of these hospitalizations have taken place in [place] [facility]. His last discharge the patient was diagnosed with amphetamine abuse and amphetamine-induced.  Patient reports prior trials with Zoloft, Paxil, Wellbutrin, and trazodone, Effexor, and Tegretol which were not beneficial. Asian reported responding in the past and Neurontin and Elavil.  Patient is states that he did follow up with Dr. Omelia Blackwater but that he was not open or honest with him.  Risk to Self: Is patient at risk for suicide?: No Risk to Others:   no Prior Inpatient Therapy:   yes Prior Outpatient Therapy:   yes  Alcohol Screening: Patient refused Alcohol Screening Tool: Yes 1. How often do you have a drink containing alcohol?: 4 or more times a week 2. How many  drinks containing alcohol do you have on a typical day when you are drinking?: 10 or more 3. How often do you have six or more drinks on one occasion?: Weekly Preliminary Score: 7 4. How often during the last year have you found that you were not able to stop drinking once you had started?: Never 5. How often during the last year have you failed to do what was normally expected from you becasue of drinking?: Never 6. How often during the last year have you needed a first drink in the morning to get yourself going after a heavy drinking session?: Never 7. How often during the last year have you had a feeling of guilt of remorse  after drinking?: Never 8. How often during the last year have you been unable to remember what happened the night before because you had been drinking?: Never 9. Have you or someone else been injured as a result of your drinking?: No 10. Has a relative or friend or a doctor or another health worker been concerned about your drinking or suggested you cut down?: Yes, during the last year Alcohol Use Disorder Identification Test Final Score (AUDIT): 15 Brief Intervention: Patient declined brief intervention Substance Abuse History in the last 12 months: Yes.  Consequences of Substance Abuse: Medical Consequences: Worsening depression Family Consequences: Altercation with wife Blackouts:  Withdrawal Symptoms: Diaphoresis Previous Psychotropic Medications: Yes  Psychological Evaluations: No   Past Medical History: Patient carries a diagnosis of rheumatoid colitis for which he has been prescribed with prednisone 20 mg. The patient states he just to see a rheumatologist in [facility] but because he moved to [place] he was not illegible to return to the community clinic. Recently Dr. Omelia Blackwater his psychiatrist is the one that has been prescribing the prednisone for him Past Medical History  Diagnosis Date  . Bipolar 1 disorder (HCC)   . Schizophrenia (HCC)   . Depression   . Anxiety   . Rheumatoid arthritis (HCC)   . Hypertension   . Schizoaffective disorder, depressive type (HCC) 10/06/2015  . Alcohol use disorder, moderate, dependence (HCC) 02/13/2014   History reviewed. No pertinent past surgical history.  Family History: History reviewed. No pertinent family history.   Family Psychiatric History: Patient reports that he is grandfather on his mother's side suffered from schizophrenia. He reports that he is mother and her sisters suffer from bipolar and depression. He reports that his father suffers from alcoholism  Social History: Patient states that last  week prior to admission, he lost his place to stay and his job. 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 They got into an argument last weekend and therefore the patient lost his job and place to stay. He is now homeless patient states in the past he has worked as an Personnel officer, in Advice worker. As far as he is legal history he reports past charges of domestic violence and charges for driving with a revoked license. He is single never married and doesn't have any children. History  Alcohol Use  . Yes    History  Drug Use  . Yes  . Special: Amphetamines    Comment: THC    Social History   Social History  . Marital Status: Single    Spouse Name: N/A  . Number of Children: N/A  . Years of Education: N/A   Social History Main Topics  . Smoking status: Former Smoker -- 0.00 packs/day for 0 years  . Smokeless tobacco: None  .  Alcohol Use: Yes  . Drug Use: Yes    Special: Amphetamines     Comment: THC  . Sexual Activity: Yes    Birth Control/ Protection: None   Other Topics Concern  . None         Sleep: Fair  Appetite:  Fair  Current Medications: Current Facility-Administered Medications  Medication Dose Route Frequency Provider Last Rate Last Dose  . acetaminophen (TYLENOL) tablet 650 mg  650 mg Oral Q4H PRN Charm Rings, NP   650 mg at 10/07/15 1738  . alum & mag hydroxide-simeth (MAALOX/MYLANTA) 200-200-20 MG/5ML suspension 30 mL  30 mL Oral Q4H PRN Jimmy Footman, MD      . amitriptyline (ELAVIL) tablet 100 mg  100 mg Oral QHS Jimmy Footman, MD   100 mg at 10/09/15 2237  . amLODipine (NORVASC) tablet 2.5 mg  2.5 mg Oral Daily Jimmy Footman, MD   2.5 mg at 10/10/15 0857  . feeding supplement (ENSURE ENLIVE) (ENSURE ENLIVE) liquid 237 mL  237 mL Oral BID BM Jimmy Footman, MD   237 mL at 10/10/15 1400  . gabapentin (NEURONTIN)  capsule 100 mg  100 mg Oral TID Jimmy Footman, MD      . ibuprofen (ADVIL,MOTRIN) tablet 600 mg  600 mg Oral Q8H PRN Charm Rings, NP   600 mg at 10/08/15 1612  . magnesium hydroxide (MILK OF MAGNESIA) suspension 30 mL  30 mL Oral Daily PRN Jimmy Footman, MD      . ondansetron [facility]) tablet 4 mg  4 mg Oral Q8H PRN Charm Rings, NP      . predniSONE (DELTASONE) tablet 20 mg  20 mg Oral Q breakfast Jimmy Footman, MD   20 mg at 10/10/15 2956    Lab Results:  No results found for this or any previous visit (from the past 48 hour(s)).  Physical Findings: AIMS:  , ,  ,  ,    CIWA:  CIWA-Ar Total: 1 COWS:     Musculoskeletal: Strength & Muscle Tone: within normal limits Gait & Station: normal Patient leans: N/A  Psychiatric Specialty Exam: Review of Systems  HENT: Negative.   Eyes: Negative.   Respiratory: Negative.   Cardiovascular: Negative.   Gastrointestinal: Negative.   Genitourinary: Negative.   Musculoskeletal: Negative.   Skin: Negative.   Neurological: Positive for weakness.  Endo/Heme/Allergies: Negative.   Psychiatric/Behavioral: Positive for depression and substance abuse. Negative for suicidal ideas, hallucinations and memory loss. The patient is nervous/anxious. The patient does not have insomnia.     Blood pressure 116/76, pulse 99, temperature 98.1 F (36.7 C), temperature source Oral, resp. rate 18, height 6\' 2"  (1.88 m), weight 86.637 kg (191 lb), SpO2 100 %.Body mass index is 24.51 kg/(m^2).  General Appearance: Well Groomed  ::  Fair  Speech:  Clear and Coherent  Volume:  Normal  Mood:  Anxious and Dysphoric  Affect:  Constricted  Thought Process:  Linear  Orientation:  Full (Time, Place, and Person)  Thought Content:  Hallucinations: None  Suicidal Thoughts:  No  Homicidal Thoughts:  No  Memory:  Immediate;   Good Recent;   Good Remote;   Good  Judgement:  Fair  Insight:  Shallow  Psychomotor  Activity:  Increased  Concentration:  Fair  Recall:  NA  Fund of Knowledge:Good  Language: Good  Akathisia:  No  Handed:    AIMS (if indicated):     Assets:  Communication Skills Desire for Improvement Physical Health  ADL's:  Intact  Cognition: WNL  Sleep:  Number of Hours: 6.25   Treatment Plan Summary: Daily contact with patient to assess and evaluate symptoms and progress in treatment and Medication management   44 year old Caucasian male with history of alcohol, amphetamine and opiate abuse along with depression and anxiety presents to our emergency department intoxicated with an alcohol level of 258, and a urine toxicology positive for benzodiazepines and barbiturates. Patient was admitted to behavioral health as he had attempted to jump out of a moving vehicle and family have reported bizarre behavior.  Major depressive disorder: Patient reports a long history of depression that has responded to the treatment with a TCA which he has been purchasing on the Internet as this medication is not prescribed in the [place]. The patient states he relapsed on alcohol after running out of this medication a month ago. He reports a prior positive response to Elavil and therefore we discussed the possibility of restarting this medication. Patient has received education about the risk of TCA in overdose and the risk of combining a TCA with alcohol. Continue Elavil 100 mg by mouth daily at bedtime  Insomnia: Patient reports chronic issues with insomnia. Patient is sleeping well since the started on Elavil 100 mg by mouth daily at bedtime  Anxiety-social phobia: Patient is states that he has been tried on Neurontin in the past. He found this medication very beneficial in decreasing his anxiety level and "paranoia".  I will discontinue Risperdal as I feel Onalee Hua "paranoid symptoms" patient was reporting did not fit the criteria for any reason other disorder or psychotic disorder and appeared to be  more likely secondary to anxiety. Patient will be started today on Neurontin 100 mg 3 times a day  Alcohol withdrawal: No longer withdrawing. I will discontinue Librium.  Hypertension: Continue Norvasc 2.5 mg daily and low sodium diet.  Nausea: Continue Zofran when necessary  Rheumatoid arthritis: Continue prednisone 20 mg by mouth daily  Precautions every 15 minute checks  Hospitalization and status continue involuntary commitment  Discharge disposition: Unclear at this point. Patient is open to the idea of being referred to a halfway house. He also will talk to his friends and asked him if he can stay with them after discharge. Patient still has not made decisions about his disposition. He feels pressured and overwhelmed at this time and does not want to cooperate with discharge planning today.  Approximately length 24-48 hours.  Jimmy Footman 10/10/2015, 1:41 PM

## 2015-10-11 MED ORDER — AMLODIPINE BESYLATE 2.5 MG PO TABS: 2.5000 mg | ORAL_TABLET | Freq: Every day | ORAL | Status: DC

## 2015-10-11 MED ORDER — PREDNISONE 20 MG PO TABS: 20.0000 mg | ORAL_TABLET | Freq: Every day | ORAL | Status: DC

## 2015-10-11 MED ORDER — GABAPENTIN 100 MG PO CAPS: 100.0000 mg | ORAL_CAPSULE | Freq: Three times a day (TID) | ORAL | Status: DC

## 2015-10-11 MED ORDER — AMITRIPTYLINE HCL 100 MG PO TABS: 100.0000 mg | ORAL_TABLET | Freq: Every day | ORAL | Status: DC

## 2015-10-11 NOTE — Progress Notes (Signed)
Recreation Therapy Notes  INPATIENT RECREATION TR PLAN  Patient Details Name: Curtis Cobb MRN: [id number] DOB: 08/09/1971 Today's Date: 10/11/2015  Rec Therapy Plan Is patient appropriate for Therapeutic Recreation?: Yes Treatment times per week: At least once a week TR Treatment/Interventions: 1:1 session, Group participation (Comment) (Appropriate participation in daily recreation therapy tx)  Discharge Criteria Pt will be discharged from therapy if:: Treatment goals are met, Discharged Treatment plan/goals/alternatives discussed and agreed upon by:: Patient/family  Discharge Summary Short term goals set: See Care Plan Short term goals met: Complete Progress toward goals comments: One-to-one attended Which groups?: Self-esteem, Coping skills One-to-one attended: Self-esteem, stress management, coping skills Reason goals not met: N/A Therapeutic equipment acquired: None Reason patient discharged from therapy: Treatment goals met Pt/family agrees with progress & goals achieved: Yes Date patient discharged from therapy: 10/11/15   Leonette Monarch, LRT/CTRS 10/11/2015, 5:41 PM

## 2015-10-11 NOTE — Progress Notes (Signed)
  [facility] Adult Case Management Discharge Plan :  Will you be returning to the same living situation after discharge:  No. Patient will be staying with a friend till Monday and has a boarding application in and will be looking at other boarding house options.  At discharge, do you have transportation home?: Yes,  patient's friend will pick him up Liborio Nixon Do you have the ability to pay for your medications: No. Patient is referred to [facility] and [facility] also has pharmacy assistance  Release of information consent forms completed and in the chart;  Patient's signature needed at discharge.  Patient to Follow up at: Follow-up Information    Follow up with [facility]. Go on 10/14/2015.   Why:  For follow-up care appt Monday 10/14/15 at 7:00am   Contact information:   [address] [place], [place] Ph [phone] Fax [phone] Call Lorella Nimrod at [phone]      [facility]   Why:  Application and scripts have been faxed to facility and [facility] will call patient within 1 week of discharge otherwise followup with clinic by calling   Contact information:   [address] [place], [place] Ph [phone] Fax [phone]      Patient denies SI/HI: Yes,  patient denies SI/HI    Safety Planning and Suicide Prevention discussed: Yes,  SPE provided to patient and his friend [place] [phone]  Have you used any form of tobacco in the last 30 days? (Cigarettes, Smokeless Tobacco, Cigars, and/or Pipes): Patient Refused Screening  Has patient been referred to the Quitline?: N/A patient is not a smoker  Beryl Meager T, MSW, LCSWA 10/11/2015, 1:44 PM

## 2015-10-11 NOTE — [facility] Group Notes (Signed)
[facility] LCSW Aftercare Discharge Planning Group Note   10/11/2015 11:01 AM  Participation Quality:  Patient attended group and participated appropriately. Patient introduced himself and shared his SMART goal is to "find stability in all areas of my life by networking, finding employment and housing, financially, and following my aftercare plan". Patient was attentive and particpated throughout group and shared his perspective of the picture on the front of Friday's workbook of a sign stating 'rough road ahead ' and patient shared that "if it took 13 miles to get to where we are it may take another 13 miles to get out of the woods" meaning that recovery may take time.  Mood/Affect:  Appropriate  Thoughts of Suicide:  No Will you contract for safety?   NA  Current AVH:  No  Plan for Discharge/Comments:  Patient will dsicharge with a friend until he gets into a boarding house which is Monday or Tuesday next week. Patient will have mental health follow up in [facility].   Transportation Means: Patient has a friend that will pick him up Saturday around 5pm  Supports: Patient has supportive friends and mental health follow up.   Lulu Riding, MSW, LCSWA

## 2015-10-11 NOTE — Progress Notes (Signed)
[facility] Progress Note  10/11/2015 11:42 AM Curtis Cobb  MRN:  [id number] Subjective: Today patient reports feeling much improved. No longer feeling sedated, overwhelmed, or severely anxious. He has been out of his room and has been attending groups. He has been sleeping well. His oral intake is about 100% on every meal. He states he is tolerating medications well. Denies any side effects and denies any physical complaints. His mood is much improved, anxiety level is significantly lower. Denies SI, HI or auditory or visual hallucinations.  Patient tells me he feels okay to be discharged tomorrow he plans to stay with a friend over the weekend and then try to get into a boarding house early next week. Patient has already completed an application for a boarding house with the help of the social worker. He tells me he's been offered a job through a friend. His friend has asked him to complete a breathalyzer every day and do a urine toxicology screen every week patient has agreed.  Patient met with [facility] to support specialist this morning. Patient okay with attending intensive outpatient substance abuse.  Per nursing: D: Pt denies SI/HI/AVH. Pt is pleasant and cooperative, affect is flat and sad but brightens upon approach. Pt stated he feels better today,he appears less anxious and he is interacting with peers and staff appropriately.  A: Pt was offered support and encouragement. Pt was given scheduled medications. Pt was encouraged to attend groups. Q 15 minute checks were done for safety.  R:Pt attends groups and interacts well with peers and staff. Pt is taking medication. Pt has no complaints.Pt receptive to treatment and safety maintained on unit.   Principal Problem: Major depressive disorder, recurrent, moderate ([place]) Diagnosis:   Patient Active Problem List   Diagnosis Date Noted  . Opioid use disorder, mild, in sustained remission [F11.90] 10/08/2015  . Social phobia [F40.10] 10/08/2015  .  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 Major depressive disorder, recurrent, moderate ([place]) [F33.1] 10/08/2015  . Alcohol use disorder, severe, dependence ([place]) [F10.20] 10/07/2015  . Cannabis use disorder, severe, dependence ([place]) [F12.20] 10/07/2015  . 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 HTN (hypertension) [I10] 10/07/2015  . Rheumatoid arthritis ([place]) [M06.9] 10/07/2015   Total Time spent with patient: 30 minutes   History of Present Illness: Curtis Cobb is a 44 y.o. male patient with a h/o polysubstance use, major depression and ADHD who presents to [facility] ED with alcohol intoxication (presentation BAL 258) and getting into an altercation with his girlfriend. Pt's girlfriend called police and the pt picked her up and threatened to harm her. Per ED MD notes, the pt was brought to the ED by police for jumping out of a moving vehicle and alcohol intoxication. Patient's friends reported that he had not taken medication in 2 months. Friend noticed pt exhibited unusual behavior yesterday with "Missing segments of time." the pt shared with her he wanted to kill himself and he attempted to jump out of a moving car. She also notes he was drinking Vodka in her car. States he has exhibited threatening and paranoid and spoke of hurting others.   Patient was transferred from the emergency department to the [facility] behavioral health unit on October 10. He was started on a Librium taper to address alcohol withdrawals.  Today the patient tells me he started having issues with depression around the age of 38. Around the same time he started experimenting with substances in order to treat  his depressive symptoms. He is started abusing opiates and marijuana. However he states that he has not used any of these 2 drugs in several years. Patient states his main issue now is with alcohol. Patient has been in residential treatment twice in the past.    Patient states that he was purchasing a antidepressant on the Internet that is called Stablon. This medication is not prescribed in the [place]. Appears that this medication acts like a TCA. He reports having a great response to this medication. He he reports doing very well while taking this medication. However he ran out and was unable to obtain more medication on the Internet and this led to him relapsing on alcohol. The patient stated for the last 30 days he's been drinking "a moderate amount" however prior to admission he had consumed at least half a gallon of liquor. Patient states he does not remember much of what happened that day; he cannot explain why his urine toxicology screen was positive for barbiturates and benzodiazepines.   Patient describes issues with anxiety that appeared to be mainly related to social situations. He states he feels like people can see all of the insecurities in him.   Today he denies SI, HI or having auditory or visual hallucinations. Denies having any symptoms of mania or hypomania now or in the past. He does report depressive symptoms, such as feelings of inferiority, insecurity, sadness and insomnia. Trauma history negative.  Substance abuse history patient reports past history of opiate and cannabis use. This has been in remission for many years. Most recently the patient has been drinking heavily. Patient has been in inpatient residential treatment for alcoholism twice. [facility] and was discharged in June 2015 [facility] and the stated with them until June 2016. Patient reports that during [facility] he was sober for 12 months and he did very well did not have any psychotic symptoms or major issues with depression. His longest sobriety outside treatment was 3 years. Per records patient also has a history of amphetamine abuse. He reports that he was prescribed  by Dr. Rosine Door with Adderall which she used to abuse.   Associated Signs/Symptoms: Depression Symptoms: depressed mood, insomnia, disturbed sleep, (Hypo) Manic Symptoms: none Anxiety Symptoms: Social Anxiety, Psychotic Symptoms: Paranoia, PTSD Symptoms: Negative Total Time spent with patient: 1 hour  Past Psychiatric History: Patient has been hospitalized for mental illness and substance abuse at least 4 times in the past. Most of these hospitalizations have taken place in [facility]. His last discharge the patient was diagnosed with amphetamine abuse and amphetamine-induced.  Patient reports prior trials with Zoloft, Paxil, Wellbutrin, and trazodone, Effexor, and Tegretol which were not beneficial. Asian reported responding in the past and Neurontin and Elavil.  Patient is states that he did follow up with Dr. Rosine Door but that he was not open or honest with him.  Risk to Self: Is patient at risk for suicide?: No Risk to Others:   no Prior Inpatient Therapy:   yes Prior Outpatient Therapy:   yes  Alcohol Screening: Patient refused Alcohol Screening Tool: Yes 1. How often do you have a drink containing alcohol?: 4 or more times a week 2. How many drinks containing alcohol do you have on a typical day when you are drinking?: 10 or more 3. How often do you have six or more drinks on one  occasion?: Weekly Preliminary Score: 7 4. How often during the last year have you found that you were not able to stop drinking once you had started?: Never 5. How often during the last year have you failed to do what was normally expected from you becasue of drinking?: Never 6. How often during the last year have you needed a first drink in the morning to get yourself going after a heavy drinking session?: Never 7. How often during the last year have you had a feeling of guilt of remorse after drinking?: Never 8. How often during the last year have you been unable to remember what  happened the night before because you had been drinking?: Never 9. Have you or someone else been injured as a result of your drinking?: No 10. Has a relative or friend or a doctor or another health worker been concerned about your drinking or suggested you cut down?: Yes, during the last year Alcohol Use Disorder Identification Test Final Score (AUDIT): 15 Brief Intervention: Patient declined brief intervention Substance Abuse History in the last 12 months: Yes.  Consequences of Substance Abuse: Medical Consequences: Worsening depression Family Consequences: Altercation with wife Blackouts:  Withdrawal Symptoms: Diaphoresis Previous Psychotropic Medications: Yes  Psychological Evaluations: No   Past Medical History: Patient carries a diagnosis of rheumatoid colitis for which he has been prescribed with prednisone 20 mg. The patient states he just to see a rheumatologist in [facility] but because he moved to [place] he was not illegible to return to the community clinic. Recently Dr. Rosine Door his psychiatrist is the one that has been prescribing the prednisone for him Past Medical History  Diagnosis Date  . Bipolar 1 disorder ([place])   . Schizophrenia ([place])   . Depression   . Anxiety   . Rheumatoid arthritis ([place])   . Hypertension   . Schizoaffective disorder, depressive type ([place]) 10/06/2015  . Alcohol use disorder, moderate, dependence ([place]) 02/13/2014   History reviewed. No pertinent past surgical history.  Family History: History reviewed. No pertinent family history.   Family Psychiatric History: Patient reports that he is grandfather on his mother's side suffered from schizophrenia. He reports that he is mother and her sisters suffer from bipolar and depression. He reports that his father suffers from alcoholism  Social History: Patient states that last week prior to admission, he lost his place to stay and his job. 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 They got into an argument last weekend and therefore the patient lost his job and place to stay. He is now homeless patient states in the past he has worked as an Clinical biochemist, in [employer]. As far as he is legal history he reports past charges of domestic violence and charges for driving with a revoked license. He is single never married and doesn't have any children. History  Alcohol Use  . Yes    History  Drug Use  . Yes  . Special: Amphetamines    Comment: THC    Social History   Social History  . Marital Status: Single    Spouse Name: N/A  . Number of Children: N/A  . Years of Education: N/A   Social History Main Topics  . Smoking status: Former Smoker -- 0.00 packs/day for 0 years  . Smokeless tobacco: None  . Alcohol Use: Yes  . Drug Use: Yes    Special: Amphetamines     Comment: THC  . Sexual Activity: Yes  Birth Control/ Protection: None   Other Topics Concern  . None         Sleep: Fair  Appetite:  Fair  Current Medications: Current Facility-Administered Medications  Medication Dose Route Frequency Provider Last Rate Last Dose  . acetaminophen (TYLENOL) tablet 650 mg  650 mg Oral Q4H PRN Patrecia Pour, NP   650 mg at 10/07/15 1738  . alum & mag hydroxide-simeth (MAALOX/MYLANTA) 200-200-20 MG/5ML suspension 30 mL  30 mL Oral Q4H PRN Hildred Priest, MD      . amitriptyline (ELAVIL) tablet 100 mg  100 mg Oral QHS Hildred Priest, MD   100 mg at 10/10/15 2115  . amLODipine (NORVASC) tablet 2.5 mg  2.5 mg Oral Daily Hildred Priest, MD   2.5 mg at 10/11/15 0843  . feeding supplement (ENSURE ENLIVE) (ENSURE ENLIVE) liquid 237 mL  237 mL Oral BID BM Hildred Priest, MD   237 mL at 10/10/15 1400  . gabapentin (NEURONTIN) capsule 100 mg  100 mg Oral TID Hildred Priest, MD   100 mg at 10/11/15 0844  .  ibuprofen (ADVIL,MOTRIN) tablet 600 mg  600 mg Oral Q8H PRN Patrecia Pour, NP   600 mg at 10/08/15 1612  . magnesium hydroxide (MILK OF MAGNESIA) suspension 30 mL  30 mL Oral Daily PRN Hildred Priest, MD      . ondansetron [facility]) tablet 4 mg  4 mg Oral Q8H PRN Patrecia Pour, NP      . predniSONE (DELTASONE) tablet 20 mg  20 mg Oral Q breakfast Hildred Priest, MD   20 mg at 10/11/15 1103    Lab Results:  No results found for this or any previous visit (from the past 48 hour(s)).  Physical Findings: AIMS:  , ,  ,  ,    CIWA:  CIWA-Ar Total: 1 COWS:     Musculoskeletal: Strength & Muscle Tone: within normal limits Gait & Station: normal Patient leans: N/A  Psychiatric Specialty Exam: Review of Systems  Constitutional: Negative.   HENT: Negative.   Eyes: Negative.   Respiratory: Negative.   Cardiovascular: Negative.   Gastrointestinal: Negative.   Genitourinary: Negative.   Musculoskeletal: Negative.   Skin: Negative.   Endo/Heme/Allergies: Negative.   Psychiatric/Behavioral: Positive for depression and substance abuse. Negative for suicidal ideas, hallucinations and memory loss. The patient is nervous/anxious. The patient does not have insomnia.     Blood pressure 153/97, pulse 90, temperature 98.2 F (36.8 C), temperature source Oral, resp. rate 20, height 6' 2"  (1.88 m), weight 86.637 kg (191 lb), SpO2 100 %.Body mass index is 24.51 kg/(m^2).  General Appearance: Well Groomed  Engineer, water::  Good  Speech:  Clear and Coherent  Volume:  Normal  Mood:  Euthymic  Affect:  Appropriate and Congruent  Thought Process:  Linear  Orientation:  Full (Time, Place, and Person)  Thought Content:  Hallucinations: None  Suicidal Thoughts:  No  Homicidal Thoughts:  No  Memory:  Immediate;   Good Recent;   Good Remote;   Good  Judgement:  Fair  Insight:  Shallow  Psychomotor Activity:  Normal  Concentration:  Fair  Recall:  NA  Fund of Knowledge:Good   Language: Good  Akathisia:  No  Handed:    AIMS (if indicated):     Assets:  Communication Skills Desire for Improvement Physical Health  ADL's:  Intact  Cognition: WNL  Sleep:  Number of Hours: 7.75   Treatment Plan Summary: Daily contact with patient to assess and  evaluate symptoms and progress in treatment and Medication management   44 year old Caucasian male with history of alcohol, amphetamine and opiate abuse along with depression and anxiety presents to our emergency department intoxicated with an alcohol level of 258, and a urine toxicology positive for benzodiazepines and barbiturates. Patient was admitted to behavioral health as he had attempted to jump out of a moving vehicle and family have reported bizarre behavior.  Major depressive disorder: Patient reports a long history of depression that has responded to the treatment with a TCA which he has been purchasing on the Internet as this medication is not prescribed in the [place]. The patient states he relapsed on alcohol after running out of this medication a month ago. He reports a prior positive response to Elavil and therefore we discussed the possibility of restarting this medication. Patient has received education about the risk of TCA in overdose and the risk of combining a TCA with alcohol. Continue Elavil 100 mg by mouth daily at bedtime.  Patient is improving no change  Insomnia: Patient reports chronic issues with insomnia. Patient is sleeping well since the started on Elavil 100 mg by mouth daily at bedtime.  Sleeping well no change  Anxiety-social phobia: Patient is states that he has been tried on Neurontin in the past. He found this medication very beneficial in decreasing his anxiety level and "paranoia".  I will discontinue Risperdal as I feel Shanon Brow "paranoid symptoms" patient was reporting did not fit the criteria for any reason other disorder or psychotic disorder and appeared to be more likely secondary  to anxiety. Patient was started on Neurontin 100 mg 3 times a day on 10/13.  Patient finds his medication very helpful. No changes  Alcohol withdrawal:resolved  Hypertension: Continue Norvasc 2.5 mg daily and low sodium diet.  Nausea: Continue Zofran when necessary  Rheumatoid arthritis: Continue prednisone 20 mg by mouth daily  Precautions every 15 minute checks  Hospitalization and status continue involuntary commitment  Discharge disposition: We will discharge tomorrow to a friend's house. He has received assistance with finding a boarding house and has completed an application. Boarding has had reported that the patient can move there early next week.  Plan to refer the patient for intensive substance abuse treatment at [facility].  Approximately length 24 hours.  Hildred Priest 10/11/2015, 11:42 AM

## 2015-10-11 NOTE — Progress Notes (Signed)
D:  Presents with bright affect.  Verbalizes that he feels much better and is starting to feel like his old self again.  Denies SI and depression although has a tendency to stay to himself. A:  Support and encouragement provided.  Medications given according to orders. R:  Cooperative with treatment plan.  Medication and group compliant.   Safety maintained.

## 2015-10-11 NOTE — Tx Team (Signed)
Interdisciplinary Treatment Plan Update (Adult)  Date:  10/11/2015 Time Reviewed:  11:42 AM  Progress in Treatment: Attending groups: Yes. Participating in groups:  Yes. Taking medication as prescribed:  Yes. Tolerating medication:  Yes. Family/Significant othe contact made:  Yes, individual(s) contacted:  Thressa Sheller (friend) [phone] Patient understands diagnosis:  Yes. Discussing patient identified problems/goals with staff:  Yes. Medical problems stabilized or resolved:  Yes. Denies suicidal/homicidal ideation: Yes. Issues/concerns per patient self-inventory:  No. Other:  New problem(s) identified: Yes, Describe:  patient is currently homeless  Discharge Plan or Barriers: Patient has a friend that he will be staying with temporarily until he can get into a boarding house which may be Monday or Tuesday next week. Patient will need mental health followup in [facility]. Patient reports he has a friend that will pick him up Saturday 10/12/15 at 5pm and he also reports he has a job lined up.  Reason for Continuation of Hospitalization: Depression Suicidal ideation  Comments:  Estimated length of stay: up to 2 days expected discharge by Saturday 10/12/15  New goal(s):  Review of initial/current patient goals per problem list:   1.  Goal(s): decrease depression  Met:  No  Target date: 10/09/15  As evidenced by: patient report   2.  Goal (s): find housing  Met:  Yes  Target date: 10/10/15  As evidenced by: patient report   3.  Goal(s): eliminate SI  Met:  Yes  Attendees: Physician:  Merlyn Albert, MD 10/14/201611:42 AM  Nursing:   Carolynn Sayers, RN 10/14/201611:42 AM  Other:  Carmell Austria, [place] 10/14/201611:42 AM  Other:   10/14/201611:42 AM  Other:   10/14/201611:42 AM  Other:  10/14/201611:42 AM  Other:  10/14/201611:42 AM  Other:  10/14/201611:42 AM  Other:  10/14/201611:42 AM  Other:  10/14/201611:42 AM  Other:  10/14/201611:42 AM  Other:    10/14/201611:42 AM   Scribe for Treatment Team:   Keene Breath, MSW, LCSWA  10/11/2015, 11:42 AM

## 2015-10-11 NOTE — Progress Notes (Signed)
Recreation Therapy Notes  Date: 10.14.16 Time: 3:00 pm Location: Craft Room  Group Topic: Coping Skills  Goal Area(s) Addresses:  Patient will participate in coping skills. Patient will verbalize benefit of using art as a coping skill.  Behavioral Response: Attentive, Interactive   Intervention: Coloring  Activity: Patients were given coloring sheets and instructed to color thinking about what emotions they were feeling and what they were focused on.  Education: LRT educated patients on why art is a good Associate Professor.   Education Outcome: Acknowledges education/In group clarification offered   Clinical Observations/Feedback: Patient colored one sheet. Patient contributed to group discussion by stating what emotions he felt and what he was focused on while he was coloring.  Jacquelynn Cree, LRT/CTRS 10/11/2015 5:16 PM

## 2015-10-11 NOTE — Plan of Care (Signed)
Problem: [facility] Participation in Recreation Therapeutic Interventions Goal: STG-Patient will demonstrate improved self esteem by identif STG: Self-Esteem - Within 4 treatment sessions, patient will verbalize at least 5 positive affirmation statements in each of 2 treatment sessions to increase self-esteem post d/c.  Outcome: Completed/Met Date Met:  10/11/15 Treatment Session 3; Completed 2 out of 2: At approximately 12:20 pm, LRT met with patient in patient room. Patient verbalized 5 positive affirmation statements. Patient reported it felt better than yesterday. LRT encouraged patient to continue saying positive affirmation statements.  Leonette Monarch, LRT/CTRS 10.14.16 5:37 pm Goal: STG-Patient will identify at least five coping skills for ** STG: Coping Skills - Within 4 treatment sessions, patient will verbalize at least 5 coping skills for substance abuse in each of 2 treatment sessions to decrease substance abuse post d/c.  Outcome: Completed/Met Date Met:  10/11/15 Treatment Session 3; Completed 2 out of 2: At approximately 12:20 pm, LRT met with patient in patient room. Patient verbalized 5 coping skills for substance abuse. LRT encouraged patient to participate in leisure activities.  Leonette Monarch, LRT/CTRS 10.14.16 5:38 pm Goal: STG-Other Recreation Therapy Goal (Specify) STG: Stress Management - Within 4 treatment sessions, patient will verbalize understanding of the stress management techniques in each of 2 treatment sessions to increase stress management skills post d/c.  Outcome: Completed/Met Date Met:  10/11/15 Treatment Session 3; Completed 2 out of 2: At approximately 12:20 pm, LRT met with patient in patient room. Patient verbalized understanding of the stress management techniques. LRT encouraged patient to read over and practice them.  Leonette Monarch, LRT/CTRS 10.14.16 5:40 pm

## 2015-10-11 NOTE — [facility] Group Notes (Signed)
[facility] LCSW Group Therapy  10/11/2015 3:09 PM  Type of Therapy:  Group Therapy  Participation Level:  Active  Participation Quality:  Attentive  Affect:  Appropriate  Cognitive:  Alert  Insight:  Engaged  Engagement in Therapy:  Engaged  Modes of Intervention:  Discussion, Education, Socialization and Support  Summary of Progress/Problems: Feelings around Relapse. Group members discussed the meaning of relapse and shared personal stories of relapse, how it affected them and others, and how they perceived themselves during this time. Group members were encouraged to identify triggers, warning signs and coping skills used when facing the possibility of relapse. Social supports were discussed and explored in detail. Curtis Cobb attended group and stayed the entire time. He reports changes in his thinking causes him to relapse. For example he starts to believe his live is never going to improve so he stops caring which leads to relapse. He states to begin recovery, he needs a better support system.   Jamika Sadek L Jrue Jarriel MSW, LCSWA  10/11/2015, 3:09 PM

## 2015-10-11 NOTE — Discharge Summary (Signed)
Physician Discharge Summary Note  Patient:  Curtis Cobb is an 44 y.o., male MRN:  [id number] DOB:  06-14-1971 Patient phone:  [phone] (home)  Patient address:   [place] [address],  Total Time spent with patient: 30 minutes  Date of Admission:  10/07/2015 Date of Discharge: 10/12/15  Reason for Admission:  Suicidality  Principal Problem: Major depressive disorder, recurrent, moderate ([place]) Discharge Diagnoses: Patient Active Problem List   Diagnosis Date Noted  . Opioid use disorder, mild, in sustained remission [F11.90] 10/08/2015  . Social phobia [F40.10] 10/08/2015  . Substance-induced psychotic disorder with delusions ([place]) [F19.950] 10/08/2015  . Major depressive disorder, recurrent, moderate ([place]) [F33.1] 10/08/2015  . Alcohol use disorder, severe, dependence ([place]) [F10.20] 10/07/2015  . Cannabis use disorder, severe, dependence ([place]) [F12.20] 10/07/2015  . 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 HTN (hypertension) [I10] 10/07/2015  . Rheumatoid arthritis ([place]) [M06.9] 10/07/2015    Musculoskeletal: Strength & Muscle Tone: within normal limits Gait & Station: normal Patient leans: N/A  Psychiatric Specialty Exam: Physical Exam  Constitutional: He is oriented to person, place, and time. He appears well-developed and well-nourished.  HENT:  Head: Normocephalic and atraumatic.  Eyes: Conjunctivae and EOM are normal.  Neck: Normal range of motion.  Respiratory: Effort normal.  Musculoskeletal: Normal range of motion.  Neurological: He is alert and oriented to person, place, and time.  Skin: Skin is warm and dry.  Psychiatric: He has a normal mood and affect. His behavior is normal. Judgment and thought content normal.    Review of Systems  Constitutional: Negative.   HENT: Negative.   Eyes: Negative.   Respiratory: Negative.   Cardiovascular: Negative.   Gastrointestinal: Negative.   Genitourinary: Negative.    Musculoskeletal: Negative.   Skin: Negative.   Neurological: Negative.   Endo/Heme/Allergies: Negative.   Psychiatric/Behavioral: Negative.     Blood pressure 138/90, pulse 86, temperature 98.6 F (37 C), temperature source Oral, resp. rate 20, height 6' 2"  (1.88 m), weight 86.637 kg (191 lb), SpO2 100 %.Body mass index is 24.51 kg/(m^2).  General Appearance: Well Groomed  Engineer, water::  Good  Speech:  Clear and Coherent  Volume:  Normal  Mood:  Euthymic  Affect:  Congruent  Thought Process:  Linear and Logical  Orientation:  Full (Time, Place, and Person)  Thought Content:  Hallucinations: None  Suicidal Thoughts:  No  Homicidal Thoughts:  No  Memory:  Immediate;   Good Recent;   Good Remote;   Good  Judgement:  Fair  Insight:  Fair  Psychomotor Activity:  Normal  Concentration:  Good  Recall:  NA  Fund of Knowledge:Good  Language: Good  Akathisia:  No  Handed:    AIMS (if indicated):     Assets:  Communication Skills Desire for Improvement Physical Health Social Support Vocational/Educational  ADL's:  Intact  Cognition: WNL  Sleep:  Number of Hours: 6   History of Present Illness: Curtis Cobb is a 44 y.o. male patient with a h/o polysubstance use, major depression and ADHD who presents to [facility] ED with alcohol intoxication (presentation BAL 258) and getting into an altercation with his girlfriend. Pt's girlfriend called police and the pt picked her up and threatened to harm her. Per ED MD notes, the pt was brought to the ED by police for jumping out of a moving vehicle and alcohol intoxication. Patient's friends reported that he had not taken medication in 2 months. Friend noticed pt exhibited unusual  behavior yesterday with "Missing segments of time." the pt shared with her he wanted to kill himself and he attempted to jump out of a moving car. She also notes he was drinking Vodka in her car. States he has exhibited threatening and paranoid and spoke of hurting  others.   Patient was transferred from the emergency department to the [facility] behavioral health unit on October 10. He was started on a Librium taper to address alcohol withdrawals.  Today the patient tells me he started having issues with depression around the age of 81. Around the same time he started experimenting with substances in order to treat his depressive symptoms. He is started abusing opiates and marijuana. However he states that he has not used any of these 2 drugs in several years. Patient states his main issue now is with alcohol. Patient has been in residential treatment twice in the past.   Patient states that he was purchasing a antidepressant on the Internet that is called Stablon. This medication is not prescribed in the [place]. Appears that this medication acts like a TCA. He reports having a great response to this medication. He he reports doing very well while taking this medication. However he ran out and was unable to obtain more medication on the Internet and this led to him relapsing on alcohol. The patient stated for the last 30 days he's been drinking "a moderate amount" however prior to admission he had consumed at least half a gallon of liquor. Patient states he does not remember much of what happened that day; he cannot explain why his urine toxicology screen was positive for barbiturates and benzodiazepines.   Patient describes issues with anxiety that appeared to be mainly related to social situations. He states he feels like people can see all of the insecurities in him.   Today he denies SI, HI or having auditory or visual hallucinations. Denies having any symptoms of mania or hypomania now or in the past. He does report depressive symptoms, such as feelings of inferiority, insecurity, sadness and insomnia. Trauma history negative.  Substance abuse history patient reports past history of opiate and cannabis use. This has been in remission for many years.  Most recently the patient has been drinking heavily. Patient has been in inpatient residential treatment for alcoholism twice. [facility] and was discharged in June 2015 [facility] and the stated with them until June 2016. Patient reports that during [facility] he was sober for 12 months and he did very well did not have any psychotic symptoms or major issues with depression. His longest sobriety outside treatment was 3 years. Per records patient also has a history of amphetamine abuse. He reports that he was prescribed by Dr. Rosine Door with Adderall which she used to abuse.   Associated Signs/Symptoms: Depression Symptoms: depressed mood, insomnia, disturbed sleep, (Hypo) Manic Symptoms: none Anxiety Symptoms: Social Anxiety, Psychotic Symptoms: Paranoia, PTSD Symptoms: Negative Total Time spent with patient: 1 hour  Past Psychiatric History: Patient has been hospitalized for mental illness and substance abuse at least 4 times in the past. Most of these hospitalizations have taken place in [facility]. His last discharge the patient was diagnosed with amphetamine abuse and amphetamine-induced.  Patient reports prior trials with Zoloft, Paxil, Wellbutrin, and trazodone, Effexor, and Tegretol which were not beneficial. Asian reported responding in the past and Neurontin and Elavil.  Patient is states that he did follow up with Dr. Rosine Door but that he was not open or honest with him.  Risk to Self: Is patient at risk for suicide?: No Risk to Others:   no Prior Inpatient Therapy:   yes Prior Outpatient Therapy:   yes  Alcohol Screening: Patient refused Alcohol Screening Tool: Yes 1. How often do you have a drink containing alcohol?: 4 or more times a week 2. How many drinks containing alcohol do you have on a typical day when you are drinking?: 10 or more 3. How often do you have  six or more drinks on one occasion?: Weekly Preliminary Score: 7 4. How often during the last year have you found that you were not able to stop drinking once you had started?: Never 5. How often during the last year have you failed to do what was normally expected from you becasue of drinking?: Never 6. How often during the last year have you needed a first drink in the morning to get yourself going after a heavy drinking session?: Never 7. How often during the last year have you had a feeling of guilt of remorse after drinking?: Never 8. How often during the last year have you been unable to remember what happened the night before because you had been drinking?: Never 9. Have you or someone else been injured as a result of your drinking?: No 10. Has a relative or friend or a doctor or another health worker been concerned about your drinking or suggested you cut down?: Yes, during the last year Alcohol Use Disorder Identification Test Final Score (AUDIT): 15 Brief Intervention: Patient declined brief intervention Substance Abuse History in the last 12 months: Yes.  Consequences of Substance Abuse: Medical Consequences: Worsening depression Family Consequences: Altercation with wife Blackouts:  Withdrawal Symptoms: Diaphoresis Previous Psychotropic Medications: Yes  Psychological Evaluations: No   Past Medical History: Patient carries a diagnosis of rheumatoid colitis for which he has been prescribed with prednisone 20 mg. The patient states he just to see a rheumatologist in [facility] but because he moved to [place] he was not illegible to return to the community clinic. Recently Dr. Rosine Door his psychiatrist is the one that has been prescribing the prednisone for him Past Medical History  Diagnosis Date  . Bipolar 1 disorder ([place])   . Schizophrenia ([place])   . Depression   . Anxiety   . Rheumatoid arthritis ([place])   . Hypertension   . Schizoaffective  disorder, depressive type ([place]) 10/06/2015  . Alcohol use disorder, moderate, dependence ([place]) 02/13/2014   History reviewed. No pertinent past surgical history.  Family History: History reviewed. No pertinent family history.   Family Psychiatric History: Patient reports that he is grandfather on his mother's side suffered from schizophrenia. He reports that he is mother and her sisters suffer from bipolar and depression. He reports that his father suffers from alcoholism  Social History: Patient states that last week prior to admission, he lost his place to stay and his job. 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 They got into an argument last weekend and therefore the patient lost his job and place to stay. He is now homeless patient states in the past he has worked as an Clinical biochemist, in [employer]. As far as he is legal history he reports past charges of domestic violence and charges for driving with a revoked license. He is single never married and doesn't have any children. History  Alcohol  Use  . Yes    History  Drug Use  . Yes  . Special: Amphetamines    Comment: THC    Social History   Social History  . Marital Status: Single    Spouse Name: N/A  . Number of Children: N/A  . Years of Education: N/A   Social History Main Topics  . Smoking status: Former Smoker -- 0.00 packs/day for 0 years  . Smokeless tobacco: None  . Alcohol Use: Yes  . Drug Use: Yes    Special: Amphetamines     Comment: THC  . Sexual Activity: Yes    Birth Control/ Protection: None   Other Topics Concern  . None   Social History Narrative          Hospital Course:   44 year old Caucasian male with history of alcohol, amphetamine and opiate abuse along with depression and anxiety presents to our emergency department intoxicated with an alcohol level of 258, and a urine toxicology positive  for benzodiazepines and barbiturates. Patient was admitted to behavioral health as he had attempted to jump out of a moving vehicle and family have reported bizarre behavior.  Major depressive disorder: Patient reports a long history of depression that has responded to the treatment with a TCA which he has been purchasing on the Internet as this medication is not prescribed in the [place]. The patient states he relapsed on alcohol after running out of this medication a month ago. He reports a prior positive response to Elavil and therefore we discussed the possibility of restarting this medication. Patient has received education about the risk of TCA in overdose and the risk of combining a TCA with alcohol. Patient will be discharged on Elavil 100 mg by mouth daily at bedtime.  Insomnia: Patient reports chronic issues with insomnia. Patient is sleeping well since the started on Elavil 100 mg by mouth daily at bedtime. Sleeping well no change  Anxiety-social phobia: Patient is states that he has been tried on Neurontin in the past. He found this medication very beneficial in decreasing his anxiety level and "paranoia".  "Paranoid symptoms" patient was reporting did not fit the criteria for any psychotic disorder and appeared to be more likely secondary to anxiety. Patient was started on Neurontin 100 mg 3 times a day on 10/13. Patient finds his medication very helpful.   Alcohol withdrawal: resolved, uncomplicated  Hypertension: During this hospitalization the patient was found to be hypertensive.  He was started on Norvasc 2.5 mg a day.  Rheumatoid arthritis: Continue prednisone 20 mg by mouth daily.  Patient will need to follow up with primary care in order to continue treatment for rheumatoid gastritis.   Discharge disposition: Patient will be discharged today to a friend's house. He has completed an application for a boarding house and most likely will be accepted Monday or Tuesday of  next week.   Discharge follow-up: Patient has met the peers support specialist from [facility]. He plans to attend intensive outpatient substance abuse treatment there.  During the early part of the hospitalization the patient was withdrawn to her room and had minimal participation in group. He complained of feeling overly sedated with medications and "paranoid" feeling that people in group were just fixated on him and looking at him. This seems that patient initially reported dissipated the day prior to discharge. Patient started attending groups and was seen frequently in the day room along with peers.  There was no need for seclusion, restraints or forced medications.  On the day of the discharge the patient was calm, pleasant and cooperative. Mood was euthymic and his affect was bright and reactive. He reported significant improvement of symptoms, no longer feeling depressed and hopeless, or helpless. He denied suicidality, homicidality, or psychosis. He was hopeful and future oriented. He was looking forward to start a new job that has been offered to him by a friend.  Patient felt that the medications were very helpful and had significantly decreased his anxiety.  Discharge Vitals:   Blood pressure 138/90, pulse 86, temperature 98.6 F (37 C), temperature source Oral, resp. rate 20, height 6' 2"  (1.88 m), weight 86.637 kg (191 lb), SpO2 100 %. Body mass index is 24.51 kg/(m^2).  Lab Results:   Results for OVIE, CORNELIO (MRN [id number]) as of 10/11/2015 15:10  Ref. Range 10/05/2015 16:51 10/06/2015 13:53 10/07/2015 18:09  Sodium Latest Ref Range: 135-145 mmol/L 140    Potassium Latest Ref Range: 3.5-5.1 mmol/L 3.0 (L)    Chloride Latest Ref Range: 101-111 mmol/L 105    CO2 Latest Ref Range: 22-32 mmol/L 24    BUN Latest Ref Range: 6-20 mg/dL 6    Creatinine Latest Ref Range: 0.61-1.24 mg/dL 0.93    Calcium Latest Ref Range: 8.9-10.3 mg/dL 8.8 (L)    EGFR (Non-African Amer.) Latest Ref Range: >60  mL/min >60    EGFR (African American) Latest Ref Range: >60 mL/min >60    Glucose Latest Ref Range: 65-99 mg/dL 94    Anion gap Latest Ref Range: 5-15  11    Alkaline Phosphatase Latest Ref Range: 38-126 U/L 48    Albumin Latest Ref Range: 3.5-5.0 g/dL 4.2    AST Latest Ref Range: 15-41 U/L 21    ALT Latest Ref Range: 17-63 U/L 23    Total Protein Latest Ref Range: 6.5-8.1 g/dL 7.3    Total Bilirubin Latest Ref Range: 0.3-1.2 mg/dL 0.6    WBC Latest Ref Range: 3.8-10.6 K/uL 13.2 (H)    RBC Latest Ref Range: 4.40-5.90 MIL/uL 4.87    Hemoglobin Latest Ref Range: 13.0-18.0 g/dL 14.7    HCT Latest Ref Range: 40.0-52.0 % 43.9    MCV Latest Ref Range: 80.0-100.0 fL 90.2    MCH Latest Ref Range: 26.0-34.0 pg 30.2    MCHC Latest Ref Range: 32.0-36.0 g/dL 33.5    RDW Latest Ref Range: 11.5-14.5 % 14.1    Platelets Latest Ref Range: 150-440 K/uL 386    Neutrophils Latest Units: % 68    Lymphocytes Latest Units: % 24    Monocytes Relative Latest Units: % 8    Eosinophil Latest Units: % 1    Basophil Latest Units: % 1    NEUT# Latest Ref Range: 1.4-6.5 K/uL 8.9 (H)    Lymphocyte # Latest Ref Range: 1.0-3.6 K/uL 3.2    Monocyte # Latest Ref Range: 0.2-1.0 K/uL 1.0    Eosinophils Absolute Latest Ref Range: 0-0.7 K/uL 0.1    Basophils Absolute Latest Ref Range: 0-0.1 K/uL 0.1    TSH Latest Ref Range: 0.450-4.500 uIU/mL   4.040  T4, Total Latest Ref Range: 4.5-12.0 ug/dL   6.6  Free Thyroxine Index Latest Ref Range: 1.2-4.9    2.2  T3 Uptake Ratio Latest Ref Range: 24-39 %   33  Alcohol, Ethyl (B) Latest Ref Range: <5 mg/dL 258 (H)    Amphetamines, Ur Screen Latest Ref Range: NONE DETECTED   NONE DETECTED   Barbiturates, Ur Screen Latest Ref Range: NONE DETECTED   POSITIVE (A)  Benzodiazepine, Ur Scrn Latest Ref Range: NONE DETECTED   POSITIVE (A)   Cocaine Metabolite,Ur Yoder Latest Ref Range: NONE DETECTED   NONE DETECTED   Methadone Scn, Ur Latest Ref Range: NONE DETECTED   NONE DETECTED    MDMA (Ecstasy)Ur Screen Latest Ref Range: NONE DETECTED   NONE DETECTED   Cannabinoid 50 Ng, Ur [place] Latest Ref Range: NONE DETECTED   NONE DETECTED   Opiate, Ur Screen Latest Ref Range: NONE DETECTED   NONE DETECTED   Phencyclidine (PCP) Ur S Latest Ref Range: NONE DETECTED   NONE DETECTED   Tricyclic, Ur Screen Latest Ref Range: NONE DETECTED   NONE DETECTED          Discharge Instructions    Diet - low sodium heart healthy    Complete by:  As directed             Medication List    STOP taking these medications        hydrOXYzine 25 MG tablet  Commonly known as:  ATARAX/VISTARIL     QUEtiapine 50 MG tablet  Commonly known as:  SEROQUEL     zolpidem 5 MG tablet  Commonly known as:  AMBIEN      TAKE these medications      Indication   amitriptyline 100 MG tablet  Commonly known as:  ELAVIL  Take 1 tablet (100 mg total) by mouth at bedtime.  Notes to Patient:  Anxiety, depression and insomnia      amLODipine 2.5 MG tablet  Commonly known as:  NORVASC  Take 1 tablet (2.5 mg total) by mouth daily.  Notes to Patient:  Blood pressure      gabapentin 100 MG capsule  Commonly known as:  NEURONTIN  Take 1 capsule (100 mg total) by mouth 3 (three) times daily.  Notes to Patient:  Anxiety      predniSONE 20 MG tablet  Commonly known as:  DELTASONE  Take 1 tablet (20 mg total) by mouth daily with breakfast.  Notes to Patient:  Rheumatoid arthritis        Follow-up Information    Follow up with [facility]. Go on 10/14/2015.   Why:  For follow-up care appt Monday 10/14/15 at 7:00am   Contact information:   [place], [place] Ph [phone] Fax [phone] Call Lanae Boast at [phone]      [facility]   Why:  Application and scripts have been faxed to facility and [facility] will call patient within 1 week of discharge otherwise followup with clinic by calling   Contact information:   [place], [place] [phone] Fax [phone]      Total Discharge Time: 30 minutes  Signed: Hildred Priest 10/12/2015, 11:32 AM

## 2015-10-11 NOTE — [facility] Group Notes (Signed)
[facility] LCSW Group Therapy  10/11/2015 12:48 PM  Type of Therapy:  Group Therapy  Participation Level:  Did Not Attend   Lulu Riding, MSW, LCSWA 10/11/2015, 12:48 PM

## 2015-10-11 NOTE — [facility] Suicide Risk Assessment (Signed)
[facility] Discharge Suicide Risk Assessment   Demographic Factors:  Male, Caucasian, Low socioeconomic status and Unemployed  Total Time spent with patient: 30 minutes   Psychiatric Specialty Exam: Physical Exam  ROS                                                         Have you used any form of tobacco in the last 30 days? (Cigarettes, Smokeless Tobacco, Cigars, and/or Pipes): Patient Refused Screening  Has this patient used any form of tobacco in the last 30 days? (Cigarettes, Smokeless Tobacco, Cigars, and/or Pipes) No  Mental Status Per Nursing Assessment::   On Admission:     Current Mental Status by Physician: Hopeful, future oriented. Motivated for treatment. His good social support. Significant decrease in anxiety.  Loss Factors: Financial problems/change in socioeconomic status  Historical Factors: Prior suicide attempts  Risk Reduction Factors:   Positive social support  Continued Clinical Symptoms:  Severe Anxiety and/or Agitation Alcohol/Substance Abuse/Dependencies Previous Psychiatric Diagnoses and Treatments  Cognitive Features That Contribute To Risk:  None    Suicide Risk:  Minimal: No identifiable suicidal ideation.  Patients presenting with no risk factors but with morbid ruminations; may be classified as minimal risk based on the severity of the depressive symptoms  Principal Problem: Major depressive disorder, recurrent, moderate ([facility]) Discharge Diagnoses:  Patient Active Problem List   Diagnosis Date Noted  . Opioid use disorder, mild, in sustained remission [F11.90] 10/08/2015  . Social phobia [F40.10] 10/08/2015  . 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 Major depressive disorder, recurrent, moderate ([facility]) [F33.1] 10/08/2015  . Alcohol use disorder, severe, dependence ([facility]) [F10.20] 10/07/2015  . Cannabis use disorder, severe, dependence ([facility]) [F12.20] 10/07/2015  . Amphetamine  use disorder, severe [F15.90] 10/07/2015  . HTN (hypertension) [I10] 10/07/2015  . Rheumatoid arthritis ([facility]) [M06.9] 10/07/2015    Follow-up Information    Follow up with [facility]. Go on 10/14/2015.   Why:  For follow-up care appt Monday 10/14/15 at 7:00am   Contact information:   [address] [place], [place] Ph [phone] Fax [phone] Call Lorella Nimrod at [phone]      [facility].   Why:  Application and scripts have been faxed to facility and [facility] will call patient within 1 week of discharge otherwise followup with clinic by calling   Contact information:   [address] [place], [place] Ph [phone] Fax [phone]        Is patient on multiple antipsychotic therapies at discharge:  No   Has Patient had three or more failed trials of antipsychotic monotherapy by history:  No  Recommended Plan for Multiple Antipsychotic Therapies: NA    Jimmy Footman 10/12/2015, 11:32 AM

## 2015-10-11 NOTE — [facility] Suicide Risk Assessment (Signed)
[facility] INPATIENT:  Family/Significant Other Suicide Prevention Education  Suicide Prevention Education:  Education Completed; [place] (friend) [phone] has been identified by the patient as the family member/significant other with whom the patient will be residing, and identified as the person(s) who will aid the patient in the event of a mental health crisis (suicidal ideations/suicide attempt).  With written consent from the patient, the family member/significant other has been provided the following suicide prevention education, prior to the and/or following the discharge of the patient.  The suicide prevention education provided includes the following:  Suicide risk factors  Suicide prevention and interventions  National Suicide Hotline telephone number  [facility] assessment telephone number  [facility] Emergency Assistance 911  [facility] and/or Residential Mobile Crisis Unit telephone number  Request made of family/significant other to:  Remove weapons (e.g., guns, rifles, knives), all items previously/currently identified as safety concern.    Remove drugs/medications (over-the-counter, prescriptions, illicit drugs), all items previously/currently identified as a safety concern.  The family member/significant other verbalizes understanding of the suicide prevention education information provided.  The family member/significant other agrees to remove the items of safety concern listed above.  Lulu Riding, MSW, LCSWA 10/11/2015, 1:40 PM

## 2015-10-12 NOTE — Plan of Care (Signed)
Problem: Diagnosis: Increased Risk For Suicide Attempt Goal: LTG-Patient Will Report Improved Mood and Deny Suicidal LTG (by discharge) Patient will report improved mood and deny suicidal ideation.  Outcome: Progressing Patient denies SI/HI     

## 2015-10-12 NOTE — [facility] Group Notes (Signed)
[facility] LCSW Group Therapy  10/12/2015 11:08 AM  Type of Therapy:  Group Therapy  Participation Level:  Did Not Attend  Modes of Intervention:  Discussion, Education, Socialization and Support  Summary of Progress/Problems: Balance in life: Patients will discuss the concept of balance and how it looks and feels to be unbalanced. Pt will identify areas in their life that is unbalanced and ways to become more balanced.    Dickson Kostelnik L Raylan Hanton MSW, LCSWA  10/12/2015, 11:08 AM  

## 2015-10-12 NOTE — Progress Notes (Signed)
D: Pt denies SI/HI/AVH. Pt is pleasant and cooperative. Patient is interacting with peers and staff appropriately.  A: Pt was offered support and encouragement. Pt was given scheduled medications. Pt was encouraged to attend groups. Q 15 minute checks were done for safety.  R:Pt attends groups and interacts well with peers and staff. Pt is compliant with medication. Pt has no complaints.Pt receptive to treatment and safety maintained on unit.

## 2015-10-12 NOTE — Progress Notes (Signed)
Pleasant and cooperative.  Smiles easily. Denies SI/HI/AVH.  Verbalizes that he feels more hopeful Discharge instructions given, verbalized understanding.  Prescriptions, seven day supply of medications and crisis card given. Personal belongings returned.  Escorted off unit by this Clinical research associate to meet family to travel home.

## 2015-10-28 ENCOUNTER — Ambulatory Visit: Payer: Self-pay

## 2015-11-13 ENCOUNTER — Encounter: Payer: Self-pay | Admitting: Emergency Medicine

## 2015-11-13 ENCOUNTER — Inpatient Hospital Stay
Admission: EM | Admit: 2015-11-13 | Discharge: 2015-11-18 | DRG: 897 | Payer: Self-pay | Attending: Internal Medicine | Admitting: Internal Medicine

## 2015-11-13 DIAGNOSIS — I10 Essential (primary) hypertension: Secondary | ICD-10-CM | POA: Diagnosis present

## 2015-11-13 DIAGNOSIS — F1020 Alcohol dependence, uncomplicated: Secondary | ICD-10-CM

## 2015-11-13 DIAGNOSIS — E860 Dehydration: Secondary | ICD-10-CM | POA: Diagnosis present

## 2015-11-13 DIAGNOSIS — R45851 Suicidal ideations: Secondary | ICD-10-CM | POA: Diagnosis present

## 2015-11-13 DIAGNOSIS — Z87891 Personal history of nicotine dependence: Secondary | ICD-10-CM

## 2015-11-13 DIAGNOSIS — F251 Schizoaffective disorder, depressive type: Secondary | ICD-10-CM | POA: Diagnosis present

## 2015-11-13 DIAGNOSIS — F332 Major depressive disorder, recurrent severe without psychotic features: Secondary | ICD-10-CM | POA: Diagnosis present

## 2015-11-13 DIAGNOSIS — M069 Rheumatoid arthritis, unspecified: Secondary | ICD-10-CM | POA: Diagnosis present

## 2015-11-13 DIAGNOSIS — F319 Bipolar disorder, unspecified: Secondary | ICD-10-CM | POA: Diagnosis present

## 2015-11-13 DIAGNOSIS — Z882 Allergy status to sulfonamides status: Secondary | ICD-10-CM

## 2015-11-13 DIAGNOSIS — Z7952 Long term (current) use of systemic steroids: Secondary | ICD-10-CM

## 2015-11-13 DIAGNOSIS — N179 Acute kidney failure, unspecified: Secondary | ICD-10-CM | POA: Diagnosis present

## 2015-11-13 DIAGNOSIS — T512X4A Toxic effect of 2-Propanol, undetermined, initial encounter: Secondary | ICD-10-CM

## 2015-11-13 DIAGNOSIS — F10939 Alcohol use, unspecified with withdrawal, unspecified: Secondary | ICD-10-CM | POA: Diagnosis present

## 2015-11-13 DIAGNOSIS — F419 Anxiety disorder, unspecified: Secondary | ICD-10-CM | POA: Diagnosis present

## 2015-11-13 DIAGNOSIS — F10231 Alcohol dependence with withdrawal delirium: Principal | ICD-10-CM | POA: Diagnosis present

## 2015-11-13 DIAGNOSIS — F10239 Alcohol dependence with withdrawal, unspecified: Secondary | ICD-10-CM | POA: Diagnosis present

## 2015-11-13 DIAGNOSIS — Z79899 Other long term (current) drug therapy: Secondary | ICD-10-CM

## 2015-11-13 DIAGNOSIS — F4010 Social phobia, unspecified: Secondary | ICD-10-CM | POA: Diagnosis present

## 2015-11-13 DIAGNOSIS — R740 Nonspecific elevation of levels of transaminase and lactic acid dehydrogenase [LDH]: Secondary | ICD-10-CM | POA: Diagnosis present

## 2015-11-13 LAB — COMPREHENSIVE METABOLIC PANEL
ALBUMIN: 3.9 g/dL (ref 3.5–5.0)
ALT: 99 U/L — ABNORMAL HIGH (ref 17–63)
ANION GAP: 20 — AB (ref 5–15)
AST: 75 U/L — AB (ref 15–41)
Alkaline Phosphatase: 75 U/L (ref 38–126)
BUN: 15 mg/dL (ref 6–20)
CHLORIDE: 104 mmol/L (ref 101–111)
CO2: 16 mmol/L — ABNORMAL LOW (ref 22–32)
Calcium: 8.9 mg/dL (ref 8.9–10.3)
Creatinine, Ser: 1.45 mg/dL — ABNORMAL HIGH (ref 0.61–1.24)
GFR calc Af Amer: >60 mL/min (ref >60)
GFR calc non Af Amer: 58 mL/min — ABNORMAL LOW (ref >60)
GLUCOSE: 136 mg/dL — AB (ref 65–99)
POTASSIUM: 3.9 mmol/L (ref 3.5–5.1)
SODIUM: 140 mmol/L (ref 135–145)
Total Bilirubin: 0.8 mg/dL (ref 0.3–1.2)
Total Protein: 7.3 g/dL (ref 6.5–8.1)

## 2015-11-13 LAB — CBC
HCT: 51 % (ref 40.0–52.0)
Hemoglobin: 17.2 g/dL (ref 13.0–18.0)
MCH: 30 pg (ref 26.0–34.0)
MCHC: 33.7 g/dL (ref 32.0–36.0)
MCV: 88.9 fL (ref 80.0–100.0)
Platelets: 420 10*3/uL (ref 150–440)
RBC: 5.73 MIL/uL (ref 4.40–5.90)
RDW: 13 % (ref 11.5–14.5)
WBC: 25.5 10*3/uL — ABNORMAL HIGH (ref 3.8–10.6)

## 2015-11-13 LAB — ACETAMINOPHEN LEVEL: Acetaminophen (Tylenol), Serum: <10 ug/mL — ABNORMAL LOW (ref 10–30)

## 2015-11-13 LAB — TSH: TSH: 3.443 u[IU]/mL (ref 0.350–4.500)

## 2015-11-13 LAB — HEMOGLOBIN A1C: Hgb A1c MFr Bld: 5.2 % (ref 4.0–6.0)

## 2015-11-13 LAB — URINE DRUG SCREEN, QUALITATIVE (ARMC ONLY)
Amphetamines, Ur Screen: NOT DETECTED
Barbiturates, Ur Screen: NOT DETECTED
Benzodiazepine, Ur Scrn: POSITIVE — AB
Cannabinoid 50 Ng, Ur ~~LOC~~: NOT DETECTED
Cocaine Metabolite,Ur ~~LOC~~: NOT DETECTED
MDMA (Ecstasy)Ur Screen: NOT DETECTED
Methadone Scn, Ur: NOT DETECTED
Opiate, Ur Screen: NOT DETECTED
Phencyclidine (PCP) Ur S: NOT DETECTED
Tricyclic, Ur Screen: NOT DETECTED

## 2015-11-13 LAB — GLUCOSE, CAPILLARY: Glucose-Capillary: 123 mg/dL — ABNORMAL HIGH (ref 65–99)

## 2015-11-13 LAB — ETHANOL: Alcohol, Ethyl (B): <5 mg/dL (ref <5)

## 2015-11-13 LAB — OSMOLALITY: Osmolality: 329 mOsm/kg (ref 275–295)

## 2015-11-13 LAB — SALICYLATE LEVEL

## 2015-11-13 LAB — MRSA PCR SCREENING: MRSA by PCR: NEGATIVE

## 2015-11-13 MED ORDER — LORAZEPAM 2 MG/ML IJ SOLN
2.0000 mg | Freq: Once | INTRAMUSCULAR | Status: AC
  Administered 2015-11-13: 2 mg via INTRAVENOUS

## 2015-11-13 MED ORDER — HEPARIN SODIUM (PORCINE) 5000 UNIT/ML IJ SOLN
5000.0000 [IU] | Freq: Three times a day (TID) | INTRAMUSCULAR | Status: DC
  Administered 2015-11-13 – 2015-11-18 (×16): 5000 [IU] via SUBCUTANEOUS
  Filled 2015-11-13 (×15): qty 1

## 2015-11-13 MED ORDER — FOLIC ACID 1 MG PO TABS
1.0000 mg | ORAL_TABLET | Freq: Once | ORAL | Status: AC
  Administered 2015-11-13: 1 mg via ORAL

## 2015-11-13 MED ORDER — MIDAZOLAM HCL 2 MG/2ML IJ SOLN
INTRAMUSCULAR | Status: AC
  Filled 2015-11-13: qty 4

## 2015-11-13 MED ORDER — MORPHINE SULFATE (PF) 2 MG/ML IV SOLN
1.0000 mg | INTRAVENOUS | Status: DC | PRN
  Administered 2015-11-13: 1 mg via INTRAVENOUS
  Filled 2015-11-13: qty 1

## 2015-11-13 MED ORDER — AMITRIPTYLINE HCL 50 MG PO TABS
100.0000 mg | ORAL_TABLET | Freq: Every day | ORAL | Status: DC
  Administered 2015-11-13 – 2015-11-17 (×5): 100 mg via ORAL
  Filled 2015-11-13: qty 2
  Filled 2015-11-13: qty 4
  Filled 2015-11-13 (×3): qty 2

## 2015-11-13 MED ORDER — SODIUM CHLORIDE 0.9 % IV BOLUS (SEPSIS)
1000.0000 mL | Freq: Once | INTRAVENOUS | Status: AC
  Administered 2015-11-13: 1000 mL via INTRAVENOUS

## 2015-11-13 MED ORDER — LORAZEPAM 2 MG/ML IJ SOLN
2.0000 mg | Freq: Once | INTRAMUSCULAR | Status: AC
  Administered 2015-11-13: 2 mg via INTRAVENOUS
  Filled 2015-11-13: qty 1

## 2015-11-13 MED ORDER — SODIUM CHLORIDE 0.9 % IJ SOLN
3.0000 mL | Freq: Two times a day (BID) | INTRAMUSCULAR | Status: DC
  Administered 2015-11-13 – 2015-11-18 (×6): 3 mL via INTRAVENOUS

## 2015-11-13 MED ORDER — FENTANYL CITRATE (PF) 100 MCG/2ML IJ SOLN
INTRAMUSCULAR | Status: AC
  Filled 2015-11-13: qty 2

## 2015-11-13 MED ORDER — LORAZEPAM 2 MG/ML IJ SOLN
INTRAMUSCULAR | Status: AC
  Administered 2015-11-13: 2 mg via INTRAVENOUS
  Filled 2015-11-13: qty 1

## 2015-11-13 MED ORDER — HEPARIN SODIUM (PORCINE) 5000 UNIT/ML IJ SOLN
INTRAMUSCULAR | Status: AC
  Administered 2015-11-13: 5000 [IU] via SUBCUTANEOUS
  Filled 2015-11-13: qty 1

## 2015-11-13 MED ORDER — ALPRAZOLAM 1 MG PO TABS
1.0000 mg | ORAL_TABLET | Freq: Three times a day (TID) | ORAL | Status: DC
  Administered 2015-11-13 – 2015-11-15 (×7): 1 mg via ORAL
  Filled 2015-11-13 (×7): qty 1

## 2015-11-13 MED ORDER — GI COCKTAIL ~~LOC~~
30.0000 mL | Freq: Once | ORAL | Status: AC
  Administered 2015-11-13: 30 mL via ORAL

## 2015-11-13 MED ORDER — THIAMINE HCL 100 MG/ML IJ SOLN
100.0000 mg | Freq: Once | INTRAMUSCULAR | Status: AC
  Administered 2015-11-13: 100 mg via INTRAVENOUS

## 2015-11-13 MED ORDER — DEXMEDETOMIDINE HCL IN NACL 400 MCG/100ML IV SOLN
0.2000 ug/kg/h | INTRAVENOUS | Status: AC
  Administered 2015-11-13: 0.6 ug/kg/h via INTRAVENOUS
  Administered 2015-11-13: 0.7 ug/kg/h via INTRAVENOUS
  Administered 2015-11-13: 0.2 ug/kg/h via INTRAVENOUS
  Administered 2015-11-14: 0.6 ug/kg/h via INTRAVENOUS
  Filled 2015-11-13 (×3): qty 100

## 2015-11-13 MED ORDER — LORAZEPAM 2 MG/ML IJ SOLN
1.0000 mg | INTRAMUSCULAR | Status: DC | PRN
  Administered 2015-11-13 – 2015-11-15 (×15): 2 mg via INTRAVENOUS
  Filled 2015-11-13 (×15): qty 1

## 2015-11-13 MED ORDER — SODIUM CHLORIDE 0.9 % IV SOLN
INTRAVENOUS | Status: DC
  Administered 2015-11-13 – 2015-11-15 (×6): via INTRAVENOUS
  Administered 2015-11-16: 1 mL via INTRAVENOUS
  Administered 2015-11-16 – 2015-11-17 (×4): via INTRAVENOUS

## 2015-11-13 MED ORDER — DOCUSATE SODIUM 100 MG PO CAPS
100.0000 mg | ORAL_CAPSULE | Freq: Two times a day (BID) | ORAL | Status: DC
  Administered 2015-11-13 – 2015-11-18 (×10): 100 mg via ORAL
  Filled 2015-11-13 (×11): qty 1

## 2015-11-13 MED ORDER — ACETAMINOPHEN 325 MG PO TABS: 650.0000 mg | ORAL_TABLET | Freq: Four times a day (QID) | ORAL | Status: DC | PRN

## 2015-11-13 MED ORDER — PIPERACILLIN-TAZOBACTAM 3.375 G IVPB
3.3750 g | Freq: Once | INTRAVENOUS | Status: DC
  Administered 2015-11-13: 3.375 g via INTRAVENOUS
  Filled 2015-11-13: qty 50

## 2015-11-13 MED ORDER — VECURONIUM BROMIDE 10 MG IV SOLR
INTRAVENOUS | Status: AC
  Filled 2015-11-13: qty 10

## 2015-11-13 MED ORDER — ONDANSETRON HCL 4 MG/2ML IJ SOLN
4.0000 mg | Freq: Once | INTRAMUSCULAR | Status: AC
  Administered 2015-11-13: 4 mg via INTRAVENOUS

## 2015-11-13 MED ORDER — VANCOMYCIN HCL IN DEXTROSE 1-5 GM/200ML-% IV SOLN
1000.0000 mg | Freq: Once | INTRAVENOUS | Status: AC
  Administered 2015-11-13: 1000 mg via INTRAVENOUS
  Filled 2015-11-13: qty 200

## 2015-11-13 MED ORDER — PROMETHAZINE HCL 25 MG/ML IJ SOLN
25.0000 mg | Freq: Four times a day (QID) | INTRAMUSCULAR | Status: DC | PRN
  Administered 2015-11-13: 25 mg via INTRAVENOUS
  Filled 2015-11-13: qty 1

## 2015-11-13 MED ORDER — ONDANSETRON HCL 4 MG/2ML IJ SOLN
4.0000 mg | Freq: Four times a day (QID) | INTRAMUSCULAR | Status: DC | PRN
  Administered 2015-11-13: 4 mg via INTRAVENOUS
  Filled 2015-11-13: qty 2

## 2015-11-13 MED ORDER — DEXMEDETOMIDINE HCL IN NACL 400 MCG/100ML IV SOLN
INTRAVENOUS | Status: AC
  Administered 2015-11-13: 0.2 ug/kg/h via INTRAVENOUS
  Filled 2015-11-13: qty 100

## 2015-11-13 MED ORDER — ONDANSETRON HCL 4 MG PO TABS: 4.0000 mg | ORAL_TABLET | Freq: Four times a day (QID) | ORAL | Status: DC | PRN

## 2015-11-13 NOTE — Progress Notes (Signed)
[facility] [place] at [facility]   PATIENT NAME: Curtis Cobb    MR#:  [id number]  DATE OF BIRTH:  [birth year]  SUBJECTIVE:  CHIEF COMPLAINT:   Chief Complaint  Patient presents with  . Alcohol Intoxication  says he's going through withdrawal symptoms.  REVIEW OF SYSTEMS:  Review of Systems  Constitutional: Negative for fever, weight loss, malaise/fatigue and diaphoresis.  HENT: Negative for ear discharge, ear pain, hearing loss, nosebleeds, sore throat and tinnitus.   Eyes: Negative for blurred vision and pain.  Respiratory: Negative for cough, hemoptysis, shortness of breath and wheezing.   Cardiovascular: Negative for chest pain, palpitations, orthopnea and leg swelling.  Gastrointestinal: Negative for heartburn, nausea, vomiting, abdominal pain, diarrhea, constipation and blood in stool.  Genitourinary: Negative for dysuria, urgency and frequency.  Musculoskeletal: Negative for myalgias and back pain.  Skin: Negative for itching and rash.  Neurological: Positive for tremors. Negative for dizziness, tingling, focal weakness, seizures, weakness and headaches.  Psychiatric/Behavioral: Positive for hallucinations. Negative for depression. The patient is nervous/anxious.    DRUG ALLERGIES:   Allergies  Allergen Reactions  . Sulfa Antibiotics Other (See Comments)    Unknown reaction   VITALS:  Blood pressure 108/92, pulse 94, temperature 98 F (36.7 C), temperature source Oral, resp. rate 16, height 6\' 2"  (1.88 m), weight 83.915 kg (185 lb), SpO2 100 %. PHYSICAL EXAMINATION:  Physical Exam  Constitutional: He is oriented to person, place, and time and well-developed, well-nourished, and in no distress.  HENT:  Head: Normocephalic and atraumatic.  Eyes: Conjunctivae and EOM are normal. Pupils are equal, round, and reactive to light.  Neck: Normal range of motion. Neck supple. No tracheal deviation present. No thyromegaly present.  Cardiovascular:  Normal rate, regular rhythm and normal heart sounds.   Pulmonary/Chest: Effort normal and breath sounds normal. No respiratory distress. He has no wheezes. He exhibits no tenderness.  Abdominal: Soft. Bowel sounds are normal. He exhibits no distension. There is no tenderness.  Musculoskeletal: Normal range of motion.  Neurological: He is alert and oriented to person, place, and time. No cranial nerve deficit.  Skin: Skin is warm and dry. No rash noted.  Psychiatric: His mood appears anxious. He is agitated. He exhibits disordered thought content.   LABORATORY PANEL:   CBC  Recent Labs Lab 11/13/15 0131  WBC 25.5*  HGB 17.2  HCT 51.0  PLT 420   ------------------------------------------------------------------------------------------------------------------ Chemistries   Recent Labs Lab 11/13/15 0131  NA 140  K 3.9  CL 104  CO2 16*  GLUCOSE 136*  BUN 15  CREATININE 1.45*  CALCIUM 8.9  AST 75*  ALT 99*  ALKPHOS 75  BILITOT 0.8   RADIOLOGY:  No results found. ASSESSMENT AND PLAN:  This is a 44 year old Caucasian male admitted for delirium tremens and alcohol abuse.  1. Delirium tremens: Heart rate and respiratory rate significantly elevated.  Continue present.  On xanax, ativan prn. Also on Precedex drip - continue CIWA protocol.  Consult intensivist for critical care management  2. Sepsis: Ruled out.  Stop  Antibiotics  3. Acute kidney injury: Mild, likely secondary to dehydration. Hydrate with intravenous fluid  4. Transaminitis: Secondary to alcohol abuse  5. Alcohol abuse: Patient is interested in detox therapy once he is stabilized  6. GI prophylaxis: Pantoprazole 7. DVT prolactins: Heparin    All the records are reviewed and case discussed with Care Management/Social Worker. Management plans discussed with the patient, family and they are in agreement.  CODE  STATUS: Full Code  TOTAL TIME TAKING CARE OF THIS PATIENT: 35 minutes.   More than 50% of  the time was spent in counseling/coordination of care: YES  POSSIBLE D/C IN 2-3 DAYS, DEPENDING ON CLINICAL CONDITION.   [facility], Pate Aylward M.D on 11/13/2015 at 4:38 PM  Between 7am to 6pm - Pager - [phone]  After 6pm go to [email or web address] - password [email or web address] [facility]  [facility] [place] [facility]  Office  [phone]  CC:  Primary care physician; No primary care provider on file.

## 2015-11-13 NOTE — Progress Notes (Signed)
Person sitting with patient asked for the nurse. Patient's nurse in another room. He states he is still feeling anxious. The woman with him states "he just doesn't want to live".Patient states he is depressed but does not have suicidal ideation or a plan to harm himself. Asked if he drank the rubbing alcohol to harm himself he states "No, I ran out of alcohol". His companion states he has tried to kill himself in the past, and has had inpatient behavioral health treatment.Informed patient's nurse of conversation with patient.

## 2015-11-13 NOTE — ED Provider Notes (Signed)
[facility] Emergency Department Provider Note  ____________________________________________  Time seen: Approximately 126 AM  I have reviewed the triage vital signs and the nursing notes.   HISTORY  Chief Complaint Alcohol Intoxication    HPI Curtis Cobb is a 44 y.o. male who comes into the hospital today detoxing from alcohol. The patient reports that for the past week he has been drinking a gallon of vodka daily. He reports that his last alcoholic drink was yesterday but he has been drinking rubbing alcohol in an attempt to detox himself from ethanol. The patient reports that he drank a liter of rubbing alcohol tonight. He has had some mild shortness of breath with vomiting but denies any abdominal pain. The patient reports that he has been detoxed before and has had alcohol withdrawal seizures in the past. He denies any diarrhea and denies any vomiting blood or blurred vision.The patient reports that he has vomited a significant again to mount prior to coming into the hospital. Pain is a 0 out of 10 in intensity. The patient is actively vomiting at this time.   Past Medical History  Diagnosis Date  . Bipolar 1 disorder (HCC)   . Schizophrenia (HCC)   . Depression   . Anxiety   . Rheumatoid arthritis (HCC)   . Hypertension   . Schizoaffective disorder, depressive type (HCC) 10/06/2015  . Alcohol use disorder, moderate, dependence (HCC) 02/13/2014    Patient Active Problem List   Diagnosis Date Noted  . Alcohol withdrawal (HCC) 11/13/2015  . Opioid use disorder, mild, in sustained remission 10/08/2015  . Social phobia 10/08/2015  . 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 Major depressive disorder, recurrent, moderate (HCC) 10/08/2015  . Alcohol use disorder, severe, dependence (HCC) 10/07/2015  . Cannabis use disorder, severe, dependence (HCC) 10/07/2015  . 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 HTN  (hypertension) 10/07/2015  . Rheumatoid arthritis (HCC) 10/07/2015    History reviewed. No pertinent past surgical history.  No current outpatient prescriptions on file.  Allergies Sulfa antibiotics  No family history on file.  Social History Social History  Substance Use Topics  . Smoking status: Former Smoker -- 0.00 packs/day for 0 years  . Smokeless tobacco: None  . Alcohol Use: Yes     Comment: gallon/day    Review of Systems Constitutional: No fever/chills Eyes: No visual changes. ENT: No sore throat. Cardiovascular: Denies chest pain. Respiratory:  shortness of breath. Gastrointestinal: Nausea and vomiting Genitourinary: Negative for dysuria. Musculoskeletal: Negative for back pain. Skin: Negative for rash. Neurological: Negative for headaches, focal weakness or numbness.  10-point ROS otherwise negative.  ____________________________________________   PHYSICAL EXAM:  VITAL SIGNS: ED Triage Vitals  Enc Vitals Group     BP 11/13/15 0050 163/129 mmHg     Pulse Rate 11/13/15 0050 152     Resp 11/13/15 0050 32     Temp 11/13/15 0050 97.7 F (36.5 C)     Temp Source 11/13/15 0050 Oral     SpO2 11/13/15 0050 99 %     Weight 11/13/15 0050 185 lb (83.915 kg)     Height 11/13/15 0050 6\' 2"  (1.88 m)     Head Cir --      Peak Flow --      Pain Score 11/13/15 0234 3     Pain Loc --      Pain Edu? --      Excl. in GC? --     Constitutional: Alert  and oriented. Ill appearing and in severe distress. Eyes: Conjunctivae are normal. PERRL. EOMI. Head: Atraumatic. Nose: No congestion/rhinnorhea. Mouth/Throat: Mucous membranes are dry.  Oropharynx non-erythematous. Cardiovascular: Tachycardia, regular rhythm. Grossly normal heart sounds.  Good peripheral circulation. Respiratory: Normal respiratory effort.  No retractions. Lungs CTAB. Gastrointestinal: Soft and nontender. No distention. Positive bowel sounds Musculoskeletal: No lower extremity tenderness nor  edema.   Neurologic:  Normal speech and language. No gross focal neurologic deficits are appreciated.  Skin:  Skin is warm, dry and intact.  Psychiatric: Mood and affect are normal.   ____________________________________________   LABS (all labs ordered are listed, but only abnormal results are displayed)  Labs Reviewed  COMPREHENSIVE METABOLIC PANEL - Abnormal; Notable for the following:    CO2 16 (*)    Glucose, Bld 136 (*)    Creatinine, Ser 1.45 (*)    AST 75 (*)    ALT 99 (*)    GFR calc non Af Amer 58 (*)    Anion gap 20 (*)    All other components within normal limits  CBC - Abnormal; Notable for the following:    WBC 25.5 (*)    All other components within normal limits  ACETAMINOPHEN LEVEL - Abnormal; Notable for the following:    Acetaminophen (Tylenol), Serum <10 (*)    All other components within normal limits  OSMOLALITY - Abnormal; Notable for the following:    Osmolality 329 (*)    All other components within normal limits  GLUCOSE, CAPILLARY - Abnormal; Notable for the following:    Glucose-Capillary 123 (*)    All other components within normal limits  CULTURE, BLOOD (ROUTINE X 2)  CULTURE, BLOOD (ROUTINE X 2)  MRSA PCR SCREENING  ETHANOL  SALICYLATE LEVEL  TSH  URINE DRUG SCREEN, QUALITATIVE ([facility] ONLY)  VOLATILES,BLD-ACETONE,ETHANOL,ISOPROP,METHANOL  HEMOGLOBIN A1C   ____________________________________________  EKG  ED ECG REPORT I, Rebecka Apley, the attending physician, personally viewed and interpreted this ECG.   Date: 11/13/2015  EKG Time: 0059  Rate: 142  Rhythm: sinus tachycardia  Axis: left axis deviation  Intervals:none  ST&T Change: none  ____________________________________________  RADIOLOGY  None ____________________________________________   PROCEDURES  Procedure(s) performed: None  Critical Care performed: Yes, see critical care note(s)  CRITICAL CARE Performed by: Lucrezia Europe P   Total critical  care time: 45 minutes  Critical care time was exclusive of separately billable procedures and treating other patients.  Critical care was necessary to treat or prevent imminent or life-threatening deterioration.  Critical care was time spent personally by me on the following activities: development of treatment plan with patient and/or surrogate as well as nursing, discussions with consultants, evaluation of patient's response to treatment, examination of patient, obtaining history from patient or surrogate, ordering and performing treatments and interventions, ordering and review of laboratory studies, ordering and review of radiographic studies, pulse oximetry and re-evaluation of patient's condition.  ____________________________________________   INITIAL IMPRESSION / ASSESSMENT AND PLAN / ED COURSE  Pertinent labs & imaging results that were available during my care of the patient were reviewed by me and considered in my medical decision making (see chart for details).  This is a 44 year old male who comes in today for alcohol detox. When the patient initially arrived his heart rate was in the 150s. The patient was also hypertensive which is concerning given that he is withdrawing from alcohol. The patient reports that he has been drinking rubbing alcohol to bridge himself but he has been  vomiting significantly as well. The patient has an elevated white blood cell count which is likely due to the acute vomiting that he has had. The patient though does have an anion gap and a low bicarbonate. I feel the patient has some alcoholic ketoacidosis. I gave the patient 2 L of normal saline as well as place him on the CIWA protocol and gave him 3 doses of Ativan 2 mg each. The patient did receive Zofran for his nausea initially and that helped with the vomiting. The patient's heart rate improved occasionally but it did continue to increase. The patient also continued to have some hypertension. Given the  patient's symptoms I will admit the patient to the hospitalist service for alcohol withdrawal and alcoholic ketoacidosis. ____________________________________________   FINAL CLINICAL IMPRESSION(S) / ED DIAGNOSES  Final diagnoses:  Alcohol withdrawal, with unspecified complication (HCC)  Toxic effect of isopropyl alcohol, undetermined intent, initial encounter      Rebecka Apley, MD 11/13/15 [phone]

## 2015-11-13 NOTE — H&P (Signed)
Curtis Cobb is an 44 y.o. male.   Chief Complaint: Alcoholism HPI: The patient presents emergency department requesting help for alcohol abuse. He states that he has been drinking literally a gallon of liquor daily for quite some time. He had tried to "cut back" on his alcohol use by drinking rubbing alcohol. He admits to nausea and nonbloody nonbilious vomiting, but denies chest pain or shortness of breath. In the emergency department the patient was found to be tachycardic and tachypneic with leukocytosis. Due to his presenting symptoms of late and treatments emergency department staff called for admission.  Past Medical History  Diagnosis Date  . Bipolar 1 disorder ([place])   . Schizophrenia ([place])   . Depression   . Anxiety   . Rheumatoid arthritis ([place])   . Hypertension   . Schizoaffective disorder, depressive type ([place]) 10/06/2015  . Alcohol use disorder, moderate, dependence ([place]) 02/13/2014    History reviewed. No pertinent past surgical history. None  No family history on file. None Social History:  reports that he has quit smoking. He does not have any smokeless tobacco history on file. He reports that he drinks alcohol. He reports that he uses illicit drugs (Amphetamines).  Allergies:  Allergies  Allergen Reactions  . Sulfa Antibiotics Other (See Comments)    Unknown reaction    Medications Prior to Admission  Medication Sig Dispense Refill  . amitriptyline (ELAVIL) 100 MG tablet Take 1 tablet (100 mg total) by mouth at bedtime. 7 tablet 0  . amLODipine (NORVASC) 2.5 MG tablet Take 1 tablet (2.5 mg total) by mouth daily. 7 tablet 0  . gabapentin (NEURONTIN) 100 MG capsule Take 1 capsule (100 mg total) by mouth 3 (three) times daily. 21 capsule 0  . predniSONE (DELTASONE) 20 MG tablet Take 1 tablet (20 mg total) by mouth daily with breakfast. 7 tablet 0    Results for orders placed or performed during the hospital encounter of 11/13/15 (from the past 48 hour(s))   Comprehensive metabolic panel     Status: Abnormal   Collection Time: 11/13/15  1:31 AM  Result Value Ref Range   Sodium 140 135 - 145 mmol/L   Potassium 3.9 3.5 - 5.1 mmol/L   Chloride 104 101 - 111 mmol/L   CO2 16 (L) 22 - 32 mmol/L   Glucose, Bld 136 (H) 65 - 99 mg/dL   BUN 15 6 - 20 mg/dL   Creatinine, Ser 1.45 (H) 0.61 - 1.24 mg/dL   Calcium 8.9 8.9 - 10.3 mg/dL   Total Protein 7.3 6.5 - 8.1 g/dL   Albumin 3.9 3.5 - 5.0 g/dL   AST 75 (H) 15 - 41 U/L   ALT 99 (H) 17 - 63 U/L    Comment: RESULT CONFIRMED BY MANUAL DILUTION   Alkaline Phosphatase 75 38 - 126 U/L   Total Bilirubin 0.8 0.3 - 1.2 mg/dL   GFR calc non Af Amer 58 (L) >60 mL/min   GFR calc Af Amer >60 >60 mL/min    Comment: (NOTE) The eGFR has been calculated using the CKD EPI equation. This calculation has not been validated in all clinical situations. eGFR's persistently <60 mL/min signify possible Chronic Kidney Disease.    Anion gap 20 (H) 5 - 15  Ethanol (ETOH)     Status: None   Collection Time: 11/13/15  1:31 AM  Result Value Ref Range   Alcohol, Ethyl (B) <5 <5 mg/dL    Comment:        LOWEST  DETECTABLE LIMIT FOR SERUM ALCOHOL IS 5 mg/dL FOR MEDICAL PURPOSES ONLY   CBC     Status: Abnormal   Collection Time: 11/13/15  1:31 AM  Result Value Ref Range   WBC 25.5 (H) 3.8 - 10.6 K/uL   RBC 5.73 4.40 - 5.90 MIL/uL   Hemoglobin 17.2 13.0 - 18.0 g/dL   HCT 51.0 40.0 - 52.0 %   MCV 88.9 80.0 - 100.0 fL   MCH 30.0 26.0 - 34.0 pg   MCHC 33.7 32.0 - 36.0 g/dL   RDW 13.0 11.5 - 14.5 %   Platelets 420 150 - 440 K/uL  Acetaminophen level     Status: Abnormal   Collection Time: 11/13/15  1:31 AM  Result Value Ref Range   Acetaminophen (Tylenol), Serum <10 (L) 10 - 30 ug/mL    Comment:        THERAPEUTIC CONCENTRATIONS VARY SIGNIFICANTLY. A RANGE OF 10-30 ug/mL MAY BE AN EFFECTIVE CONCENTRATION FOR MANY PATIENTS. HOWEVER, SOME ARE BEST TREATED AT CONCENTRATIONS OUTSIDE THIS RANGE. ACETAMINOPHEN  CONCENTRATIONS >150 ug/mL AT 4 HOURS AFTER INGESTION AND >50 ug/mL AT 12 HOURS AFTER INGESTION ARE OFTEN ASSOCIATED WITH TOXIC REACTIONS.   Salicylate level     Status: None   Collection Time: 11/13/15  1:31 AM  Result Value Ref Range   Salicylate Lvl <4.0 2.8 - 30.0 mg/dL  TSH     Status: None   Collection Time: 11/13/15  1:31 AM  Result Value Ref Range   TSH 3.443 0.350 - 4.500 uIU/mL  Osmolality     Status: Abnormal   Collection Time: 11/13/15  1:50 AM  Result Value Ref Range   Osmolality 329 (HH) 275 - 295 mOsm/kg    Comment: CRITICAL RESULT CALLED TO, READ BACK BY AND VERIFIED WITH: KERRI ERDESKY @[id number]  11/13/15.Marland KitchenMarland KitchenAJO   Glucose, capillary     Status: Abnormal   Collection Time: 11/13/15  6:06 AM  Result Value Ref Range   Glucose-Capillary 123 (H) 65 - 99 mg/dL   No results found.  Review of Systems  Constitutional: Negative for fever and chills.  HENT: Negative for sore throat and tinnitus.   Eyes: Negative for blurred vision and redness.  Respiratory: Negative for cough and shortness of breath.   Cardiovascular: Negative for chest pain, palpitations, orthopnea and PND.  Gastrointestinal: Positive for nausea and vomiting. Negative for abdominal pain and diarrhea.  Genitourinary: Negative for dysuria, urgency and frequency.  Musculoskeletal: Negative for myalgias and joint pain.  Skin: Negative for rash.       No lesions  Neurological: Negative for speech change, focal weakness and weakness.  Endo/Heme/Allergies: Does not bruise/bleed easily.       No temperature intolerance  Psychiatric/Behavioral: Negative for depression and suicidal ideas.    Blood pressure 125/85, pulse 121, temperature 98.2 F (36.8 C), temperature source Oral, resp. rate 23, height 6' 2"  (1.88 m), weight 83.915 kg (185 lb), SpO2 95 %. Physical Exam  Nursing note and vitals reviewed. Constitutional: He is oriented to person, place, and time. He appears well-developed and well-nourished. No  distress.  HENT:  Head: Normocephalic and atraumatic.  Mouth/Throat: Oropharynx is clear and moist.  Eyes: Conjunctivae and EOM are normal. Pupils are equal, round, and reactive to light. No scleral icterus.  Neck: Normal range of motion. Neck supple. No JVD present. No tracheal deviation present. No thyromegaly present.  Cardiovascular: Regular rhythm and normal heart sounds.  Exam reveals no gallop and no friction rub.   No murmur heard.  Respiratory: Effort normal and breath sounds normal. No respiratory distress. He has no wheezes.  GI: Soft. Bowel sounds are normal. He exhibits no distension. There is no tenderness.  Genitourinary:  Deferred  Musculoskeletal: Normal range of motion. He exhibits no edema.  Lymphadenopathy:    He has no cervical adenopathy.  Neurological: He is alert and oriented to person, place, and time. No cranial nerve deficit.  Skin: Skin is warm and dry. No rash noted. No erythema.  Psychiatric: He has a normal mood and affect. His behavior is normal. Judgment and thought content normal.     Assessment/Plan This is a 44 year old Caucasian male admitted for delirium tremens and alcohol abuse. 1. Delirium tremens: The patient has difficulty following conversation but is not hallucinating at this time nor is he agitated. Heart rate and respiratory rate significantly elevated. The patient is afebrile. We will admit him to the ICU for hourly monitoring of symptoms and Precedex initiation. Ativan when necessary. 2. Sepsis: The patient meets criteria via leukocytosis, tachycardia and tachypnea. Likely all of these signs are secondary to alcohol withdrawal. Nonetheless we'll we will obtain blood cultures and start broad-spectrum antibiotics.  3. Acute kidney injury: Mild, likely secondary to dehydration. Hydrate with intravenous fluid 4. Transaminitis: Secondary to alcohol abuse 5. Alcohol abuse: Patient is interested in detox therapy once he is stabilized 6. GI  prophylaxis: Pantoprazole 7. DVT prolactins: Heparin The patient is a full code. Time spent on admission orders and critical patient care approximately 45 minutes  Harrie Foreman 11/13/2015, 6:27 AM

## 2015-11-13 NOTE — Progress Notes (Signed)
eLink Physician-Brief Progress Note Patient Name: Curtis Cobb DOB: 1971/03/10 MRN: [id number]   Date of Service  11/13/2015  HPI/Events of Note  New patient evaluation.  eICU Interventions  Nothing to add        YACOUB,WESAM 11/13/2015, 6:39 AM

## 2015-11-13 NOTE — ED Notes (Addendum)
Pt to triage via w/c, brought in by EMS; pt very restless, no eye contact; pt unkempt and barefooted; Pt reports drinking gallon/day; st tonight drank rubbing alcohol and taking xanax; pt taken immediately to room 10 and placed on card monitor

## 2015-11-13 NOTE — Consult Note (Signed)
[facility] Face-to-Face Psychiatry Consult   Reason for Consult:  Consult for this 44 year old man with alcohol dependence as well as a history of abuse of multiple other drugs as well as a history of depression and anxiety disorder. Referring Physician:  Manuella Ghazi Patient Identification: EMILLIO NGO MRN:  [id number] Principal Diagnosis: Alcohol use disorder, severe, dependence ([place]) Diagnosis:   Patient Active Problem List   Diagnosis Date Noted  . Alcohol withdrawal ([place]) [F10.239] 11/13/2015  . Opioid use disorder, mild, in sustained remission [F11.90] 10/08/2015  . Social phobia [F40.10] 10/08/2015  . 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 Major depressive disorder, recurrent, moderate ([place]) [F33.1] 10/08/2015  . Alcohol use disorder, severe, dependence ([place]) [F10.20] 10/07/2015  . Cannabis use disorder, severe, dependence ([place]) [F12.20] 10/07/2015  . 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 HTN (hypertension) [I10] 10/07/2015  . Rheumatoid arthritis ([place]) [M06.9] 10/07/2015    Total Time spent with patient: 1 hour  Subjective:   NICOLES SEDLACEK is a 44 y.o. male patient admitted with "what is going on?".  HPI:  Information from the patient in direct interview, from conversation with his girlfriend on the phone at his request and review of the chart both old and new. Patient was brought to the hospital because he has gone back to drinking a extraordinary amounts of alcohol and now most recently was drinking large amounts of rubbing alcohol before coming into the hospital. Patient's girlfriend states that after he was discharged from the hospital last time he stayed sober for a couple weeks but then got back into drinking and is now drinking about a gallon of liquor a day. Yesterday he was drinking rubbing alcohol. He has not been abusing any opiates or Adderall as far as she knows but may have been getting some other drugs including  sedatives. Patient has a hard time stating his mood. Says he does feel nervous and upset right now he is mostly feeling confused. He doesn't have specific hallucinations but he feels like everything around him is unreal and he is very nervous. He denies that he had been having suicidal intent before coming into the hospital. He had been compliant apparently on his outpatient medicine although not sure if he is really followed up with an outpatient provider.  Social history: Patient lives back and forth between [place] in [place]. Has a girlfriend who is actually a substance abuse professional who seems concerned about him.  Medical history: History apparently of rheumatoid arthritis and hypertension.  Substance abuse history: Alcohol abuse but reportedly no history of seizures or clear delirium tremens. Abuse of multiple other drugs including opiates cannabis and sedatives and Adderall.  Past Psychiatric History: Long history of both substance abuse and mood and anxiety problems. According to his girlfriend he started getting paranoid and agitated even before he relapsed into drinking and she says she has seen that repeatedly that the mood and anxiety symptoms are not solely the result of alcohol use. Patient has a history of suicidal thinking but not clear attempts. No history of violence. He has been in multiple inpatient units including multiple substance abuse treatment facilities  Risk to Self: Is patient at risk for suicide?: No Risk to Others:   Prior Inpatient Therapy:   Prior Outpatient Therapy:    Past Medical History:  Past Medical History  Diagnosis Date  . Bipolar 1 disorder ([place])   . Schizophrenia ([place])   . Depression   . Anxiety   . Rheumatoid  arthritis ([place])   . Hypertension   . Schizoaffective disorder, depressive type ([place]) 10/06/2015  . Alcohol use disorder, moderate, dependence ([place]) 02/13/2014   History reviewed. No pertinent past surgical history. Family History:  No family history on file. Family Psychiatric  History: Patient currently is not able to give any history about any of this but I believe from the past notes that there is no significant mental health history in the family Social History:  History  Alcohol Use  . Yes    Comment: gallon/day     History  Drug Use  . Yes  . Special: Amphetamines    Comment: THC    Social History   Social History  . Marital Status: Single    Spouse Name: N/A  . Number of Children: N/A  . Years of Education: N/A   Social History Main Topics  . Smoking status: Former Smoker -- 0.00 packs/day for 0 years  . Smokeless tobacco: None  . Alcohol Use: Yes     Comment: gallon/day  . Drug Use: Yes    Special: Amphetamines     Comment: THC  . Sexual Activity: Yes    Birth Control/ Protection: None   Other Topics Concern  . None   Social History Narrative   Additional Social History:                          Allergies:   Allergies  Allergen Reactions  . Sulfa Antibiotics Other (See Comments)    Unknown reaction    Labs:  Results for orders placed or performed during the hospital encounter of 11/13/15 (from the past 48 hour(s))  MRSA PCR Screening     Status: None   Collection Time: 11/13/15 12:04 AM  Result Value Ref Range   MRSA by PCR NEGATIVE NEGATIVE    Comment:        The GeneXpert MRSA Assay (FDA approved for NASAL specimens only), is one component of a comprehensive MRSA colonization surveillance program. It is not intended to diagnose MRSA infection nor to guide or monitor treatment for MRSA infections.   Comprehensive metabolic panel     Status: Abnormal   Collection Time: 11/13/15  1:31 AM  Result Value Ref Range   Sodium 140 135 - 145 mmol/L   Potassium 3.9 3.5 - 5.1 mmol/L   Chloride 104 101 - 111 mmol/L   CO2 16 (L) 22 - 32 mmol/L   Glucose, Bld 136 (H) 65 - 99 mg/dL   BUN 15 6 - 20 mg/dL   Creatinine, Ser 1.45 (H) 0.61 - 1.24 mg/dL   Calcium 8.9 8.9 -  10.3 mg/dL   Total Protein 7.3 6.5 - 8.1 g/dL   Albumin 3.9 3.5 - 5.0 g/dL   AST 75 (H) 15 - 41 U/L   ALT 99 (H) 17 - 63 U/L    Comment: RESULT CONFIRMED BY MANUAL DILUTION   Alkaline Phosphatase 75 38 - 126 U/L   Total Bilirubin 0.8 0.3 - 1.2 mg/dL   GFR calc non Af Amer 58 (L) >60 mL/min   GFR calc Af Amer >60 >60 mL/min    Comment: (NOTE) The eGFR has been calculated using the CKD EPI equation. This calculation has not been validated in all clinical situations. eGFR's persistently <60 mL/min signify possible Chronic Kidney Disease.    Anion gap 20 (H) 5 - 15  Ethanol (ETOH)     Status: None   Collection Time: 11/13/15  1:31 AM  Result Value Ref Range   Alcohol, Ethyl (B) <5 <5 mg/dL    Comment:        LOWEST DETECTABLE LIMIT FOR SERUM ALCOHOL IS 5 mg/dL FOR MEDICAL PURPOSES ONLY   CBC     Status: Abnormal   Collection Time: 11/13/15  1:31 AM  Result Value Ref Range   WBC 25.5 (H) 3.8 - 10.6 K/uL   RBC 5.73 4.40 - 5.90 MIL/uL   Hemoglobin 17.2 13.0 - 18.0 g/dL   HCT 51.0 40.0 - 52.0 %   MCV 88.9 80.0 - 100.0 fL   MCH 30.0 26.0 - 34.0 pg   MCHC 33.7 32.0 - 36.0 g/dL   RDW 13.0 11.5 - 14.5 %   Platelets 420 150 - 440 K/uL  Acetaminophen level     Status: Abnormal   Collection Time: 11/13/15  1:31 AM  Result Value Ref Range   Acetaminophen (Tylenol), Serum <10 (L) 10 - 30 ug/mL    Comment:        THERAPEUTIC CONCENTRATIONS VARY SIGNIFICANTLY. A RANGE OF 10-30 ug/mL MAY BE AN EFFECTIVE CONCENTRATION FOR MANY PATIENTS. HOWEVER, SOME ARE BEST TREATED AT CONCENTRATIONS OUTSIDE THIS RANGE. ACETAMINOPHEN CONCENTRATIONS >150 ug/mL AT 4 HOURS AFTER INGESTION AND >50 ug/mL AT 12 HOURS AFTER INGESTION ARE OFTEN ASSOCIATED WITH TOXIC REACTIONS.   Salicylate level     Status: None   Collection Time: 11/13/15  1:31 AM  Result Value Ref Range   Salicylate Lvl <3.3 2.8 - 30.0 mg/dL  TSH     Status: None   Collection Time: 11/13/15  1:31 AM  Result Value Ref Range   TSH  3.443 0.350 - 4.500 uIU/mL  Osmolality     Status: Abnormal   Collection Time: 11/13/15  1:50 AM  Result Value Ref Range   Osmolality 329 (HH) 275 - 295 mOsm/kg    Comment: CRITICAL RESULT CALLED TO, READ BACK BY AND VERIFIED WITH: KERRI ERDESKY @0329  11/13/15.Marland KitchenMarland KitchenAJO   Glucose, capillary     Status: Abnormal   Collection Time: 11/13/15  6:06 AM  Result Value Ref Range   Glucose-Capillary 123 (H) 65 - 99 mg/dL  Hemoglobin A1c     Status: None   Collection Time: 11/13/15  7:17 AM  Result Value Ref Range   Hgb A1c MFr Bld 5.2 4.0 - 6.0 %  Culture, blood (routine x 2)     Status: None (Preliminary result)   Collection Time: 11/13/15  7:17 AM  Result Value Ref Range   Specimen Description BLOOD RIGHT ARM    Special Requests      BOTTLES DRAWN AEROBIC AND ANAEROBIC  [place] ANAERO 6CC AERO   Culture NO GROWTH < 12 HOURS    Report Status PENDING   Culture, blood (routine x 2)     Status: None (Preliminary result)   Collection Time: 11/13/15  7:26 AM  Result Value Ref Range   Specimen Description BLOOD RIGHT HAND    Special Requests      BOTTLES DRAWN AEROBIC AND ANAEROBIC  2CC AERO 6CC ANAERO   Culture NO GROWTH < 12 HOURS    Report Status PENDING   Urine Drug Screen, Qualitative ([place] only)     Status: Abnormal   Collection Time: 11/13/15  8:30 AM  Result Value Ref Range   Tricyclic, Ur Screen NONE DETECTED NONE DETECTED   Amphetamines, Ur Screen NONE DETECTED NONE DETECTED   MDMA (Ecstasy)Ur Screen NONE DETECTED NONE DETECTED   Cocaine Metabolite,Ur [place] NONE  DETECTED NONE DETECTED   Opiate, Ur Screen NONE DETECTED NONE DETECTED   Phencyclidine (PCP) Ur S NONE DETECTED NONE DETECTED   Cannabinoid 50 Ng, Ur [place] NONE DETECTED NONE DETECTED   Barbiturates, Ur Screen NONE DETECTED NONE DETECTED   Benzodiazepine, Ur Scrn POSITIVE (A) NONE DETECTED   Methadone Scn, Ur NONE DETECTED NONE DETECTED    Comment: (NOTE) 185  Tricyclics, urine               Cutoff 1000 ng/mL 200  Amphetamines,  urine             Cutoff 1000 ng/mL 300  MDMA (Ecstasy), urine           Cutoff 500 ng/mL 400  Cocaine Metabolite, urine       Cutoff 300 ng/mL 500  Opiate, urine                   Cutoff 300 ng/mL 600  Phencyclidine (PCP), urine      Cutoff 25 ng/mL 700  Cannabinoid, urine              Cutoff 50 ng/mL 800  Barbiturates, urine             Cutoff 200 ng/mL 900  Benzodiazepine, urine           Cutoff 200 ng/mL 1000 Methadone, urine                Cutoff 300 ng/mL 1100 1200 The urine drug screen provides only a preliminary, unconfirmed 1300 analytical test result and should not be used for non-medical 1400 purposes. Clinical consideration and professional judgment should 1500 be applied to any positive drug screen result due to possible 1600 interfering substances. A more specific alternate chemical method 1700 must be used in order to obtain a confirmed analytical result.  1800 Gas chromato graphy / mass spectrometry (GC/MS) is the preferred 1900 confirmatory method.     Current Facility-Administered Medications  Medication Dose Route Frequency Provider Last Rate Last Dose  . 0.9 %  sodium chloride infusion   Intravenous Continuous Harrie Foreman, MD 125 mL/hr at 11/13/15 1353    . acetaminophen (TYLENOL) tablet 650 mg  650 mg Oral Q6H PRN Max Sane, MD      . ALPRAZolam Duanne Moron) tablet 1 mg  1 mg Oral TID Flora Lipps, MD   1 mg at 11/13/15 1536  . amitriptyline (ELAVIL) tablet 100 mg  100 mg Oral QHS Harrie Foreman, MD      . dexmedetomidine (PRECEDEX) 400 MCG/100ML (4 mcg/mL) infusion  0.2-0.7 mcg/kg/hr Intravenous Continuous Harrie Foreman, MD 12.6 mL/hr at 11/13/15 1812 0.6 mcg/kg/hr at 11/13/15 1812  . docusate sodium (COLACE) capsule 100 mg  100 mg Oral BID Harrie Foreman, MD   100 mg at 11/13/15 1040  . heparin injection 5,000 Units  5,000 Units Subcutaneous 3 times per day Harrie Foreman, MD   5,000 Units at 11/13/15 1353  . LORazepam (ATIVAN) injection 1-2 mg  1-2  mg Intravenous Q1H PRN Harrie Foreman, MD   2 mg at 11/13/15 1536  . morphine 2 MG/ML injection 1 mg  1 mg Intravenous Q4H PRN Harrie Foreman, MD   1 mg at 11/13/15 1550  . ondansetron (ZOFRAN) tablet 4 mg  4 mg Oral Q6H PRN Harrie Foreman, MD       Or  . ondansetron [facility]) injection 4 mg  4 mg Intravenous Q6H PRN Harrie Foreman,  MD   4 mg at 11/13/15 0841  . promethazine (PHENERGAN) injection 25 mg  25 mg Intravenous Q6H PRN Max Sane, MD   25 mg at 11/13/15 1354  . sodium chloride 0.9 % injection 3 mL  3 mL Intravenous Q12H Harrie Foreman, MD   3 mL at 11/13/15 1040    Musculoskeletal: Strength & Muscle Tone: decreased Gait & Station: unable to stand Patient leans: N/A  Psychiatric Specialty Exam: Review of Systems  HENT: Negative.   Eyes: Positive for blurred vision.  Respiratory: Negative.   Cardiovascular: Negative.   Gastrointestinal: Positive for nausea.  Musculoskeletal: Negative.   Skin: Negative.   Neurological: Positive for weakness.  Psychiatric/Behavioral: Positive for depression, memory loss and substance abuse. Negative for suicidal ideas and hallucinations. The patient is nervous/anxious and has insomnia.     Blood pressure 92/55, pulse 92, temperature 98.1 F (36.7 C), temperature source Oral, resp. rate 20, height 6' 2"  (1.88 m), weight 83.915 kg (185 lb), SpO2 90 %.Body mass index is 23.74 kg/(m^2).  General Appearance: Disheveled  Eye Contact::  Minimal  Speech:  Slurred  Volume:  Decreased  Mood:  Dysphoric  Affect:  Flat  Thought Process:  Tangential  Orientation:  Negative  Thought Content:  Paranoid Ideation  Suicidal Thoughts:  No  Homicidal Thoughts:  No  Memory:  Immediate;   Poor Recent;   Poor Remote;   Fair  Judgement:  Impaired  Insight:  Present  Psychomotor Activity:  Restlessness  Concentration:  Poor  Recall:  [place]  Language: Fair  Akathisia:  No  Handed:  Right  AIMS (if indicated):      Assets:  Communication Skills Desire for Improvement Resilience Social Support  ADL's:  Intact  Cognition: Impaired,  Mild  Sleep:      Treatment Plan Summary: Medication management and Plan Patient with alcohol withdrawal who is currently on appropriate detox protocol as well as Precedex. Probably can be pretty quickly weaned off of some of that so they can come out of the critical care unit since he doesn't have a established history of delirium tremens. I see that he has been restarted on his Elavil which is appropriate. Not going to add any other specific psychiatric medicine at this point. I will follow-up and reevaluate daily and we may see if he needs to be sent back to the psychiatry ward depending on how he is doing once he is detoxed. Supportive counseling to the patient and his girlfriend. Daily follow-up. Currently does not appear to be acutely suicidal.  Disposition: No evidence of imminent risk to self or others at present.   Supportive therapy provided about ongoing stressors. Discussed crisis plan, support from social network, calling 911, coming to the Emergency Department, and calling Suicide Hotline.  Tanner Yeley 11/13/2015 6:45 PM

## 2015-11-14 DIAGNOSIS — F10239 Alcohol dependence with withdrawal, unspecified: Secondary | ICD-10-CM

## 2015-11-14 LAB — VOLATILES,BLD-ACETONE,ETHANOL,ISOPROP,METHANOL
ACETONE, BLOOD: 0.126 % — AB (ref 0.000–0.010)
ETHANOL, BLOOD: NEGATIVE % (ref 0.000–0.010)
ISOPROPANOL, BLOOD: 0.029 % — AB (ref 0.000–0.010)
Methanol, blood: NEGATIVE % (ref 0.000–0.010)

## 2015-11-14 MED ORDER — VITAMIN B-1 100 MG PO TABS
100.0000 mg | ORAL_TABLET | Freq: Every day | ORAL | Status: DC
  Administered 2015-11-14 – 2015-11-15 (×2): 100 mg via ORAL
  Filled 2015-11-14 (×2): qty 1

## 2015-11-14 MED ORDER — FOLIC ACID 1 MG PO TABS
1.0000 mg | ORAL_TABLET | Freq: Every day | ORAL | Status: DC
  Administered 2015-11-14 – 2015-11-15 (×2): 1 mg via ORAL
  Filled 2015-11-14 (×2): qty 1

## 2015-11-14 MED ORDER — BUPRENORPHINE HCL 8 MG SL SUBL
8.0000 mg | SUBLINGUAL_TABLET | Freq: Every day | SUBLINGUAL | Status: DC
  Administered 2015-11-15 (×2): 8 mg via SUBLINGUAL
  Filled 2015-11-14 (×3): qty 1

## 2015-11-14 MED ORDER — ADULT MULTIVITAMIN W/MINERALS CH
1.0000 | ORAL_TABLET | Freq: Every day | ORAL | Status: DC
  Administered 2015-11-14 – 2015-11-15 (×2): 1 via ORAL
  Filled 2015-11-14 (×2): qty 1

## 2015-11-14 MED ORDER — ZOLPIDEM TARTRATE 5 MG PO TABS
10.0000 mg | ORAL_TABLET | Freq: Once | ORAL | Status: AC
  Administered 2015-11-14: 10 mg via ORAL
  Filled 2015-11-14: qty 2

## 2015-11-14 NOTE — Progress Notes (Signed)
Per Dr. Belia Heman and Dr. Sherryll Burger pt may transfer to any med/surg and off unit telemetry

## 2015-11-14 NOTE — Progress Notes (Signed)
[facility]icians -  at [facility]   PATIENT NAME: Curtis Cobb    MR#:  [id number]  DATE OF BIRTH:  01-24-1971  SUBJECTIVE:  CHIEF COMPLAINT:   Chief Complaint  Patient presents with  . Alcohol Intoxication  much better, wife at bedside. He's eating his meal  REVIEW OF SYSTEMS:  Review of Systems  Constitutional: Negative for fever, weight loss, malaise/fatigue and diaphoresis.  HENT: Negative for ear discharge, ear pain, hearing loss, nosebleeds, sore throat and tinnitus.   Eyes: Negative for blurred vision and pain.  Respiratory: Negative for cough, hemoptysis, shortness of breath and wheezing.   Cardiovascular: Negative for chest pain, palpitations, orthopnea and leg swelling.  Gastrointestinal: Negative for heartburn, nausea, vomiting, abdominal pain, diarrhea, constipation and blood in stool.  Genitourinary: Negative for dysuria, urgency and frequency.  Musculoskeletal: Negative for myalgias and back pain.  Skin: Negative for itching and rash.  Neurological: Positive for tremors. Negative for dizziness, tingling, focal weakness, seizures, weakness and headaches.  Psychiatric/Behavioral: Negative for depression.   DRUG ALLERGIES:   Allergies  Allergen Reactions  . Sulfa Antibiotics Other (See Comments)    Unknown reaction   VITALS:  Blood pressure 124/74, pulse 78, temperature 98.3 F (36.8 C), temperature source Oral, resp. rate 23, height 6\' 2"  (1.88 m), weight 83.915 kg (185 lb), SpO2 96 %. PHYSICAL EXAMINATION:  Physical Exam  Constitutional: He is oriented to person, place, and time and well-developed, well-nourished, and in no distress.  HENT:  Head: Normocephalic and atraumatic.  Eyes: Conjunctivae and EOM are normal. Pupils are equal, round, and reactive to light.  Neck: Normal range of motion. Neck supple. No tracheal deviation present. No thyromegaly present.  Cardiovascular: Normal rate, regular rhythm and normal heart sounds.    Pulmonary/Chest: Effort normal and breath sounds normal. No respiratory distress. He has no wheezes. He exhibits no tenderness.  Abdominal: Soft. Bowel sounds are normal. He exhibits no distension. There is no tenderness.  Musculoskeletal: Normal range of motion.  Neurological: He is alert and oriented to person, place, and time. No cranial nerve deficit.  Skin: Skin is warm and dry. No rash noted.  Psychiatric: He is agitated. He exhibits ordered thought content.   LABORATORY PANEL:   CBC  Recent Labs Lab 11/13/15 0131  WBC 25.5*  HGB 17.2  HCT 51.0  PLT 420   ------------------------------------------------------------------------------------------------------------------ Chemistries   Recent Labs Lab 11/13/15 0131  NA 140  K 3.9  CL 104  CO2 16*  GLUCOSE 136*  BUN 15  CREATININE 1.45*  CALCIUM 8.9  AST 75*  ALT 99*  ALKPHOS 75  BILITOT 0.8   RADIOLOGY:  No results found. ASSESSMENT AND PLAN:  This is a 44 year old Caucasian male admitted for delirium tremens and alcohol abuse.  1. Delirium tremens: Off Precedex drip - continue CIWA protocol. Improving.  2. Sepsis: Ruled out.  Stop  Antibiotics  3. Acute kidney injury: Mild, likely secondary to dehydration. Hydrate with intravenous fluid  4. Transaminitis: Secondary to alcohol abuse  5. Alcohol abuse: Patient is interested in detox therapy once he is stabilized  6. GI prophylaxis: Pantoprazole 7. DVT prolactins: Heparin    All the records are reviewed and case discussed with Care Management/Social Worker. Management plans discussed with the patient, family and they are in agreement.  CODE STATUS: Full Code  TOTAL TIME TAKING CARE OF THIS PATIENT: 35 minutes.   More than 50% of the time was spent in counseling/coordination of care: YES  POSSIBLE D/C  IN 1-2 DAYS, DEPENDING ON CLINICAL CONDITION.   [facility], Arpi Diebold M.D on 11/14/2015 at 6:13 PM  Between 7am to 6pm - Pager - [phone]  After 6pm  go to [email or web address] - password [email or web address] [email or web address]  [place] [facility]  Office  [phone]  CC:  Primary care physician; No primary care provider on file.

## 2015-11-14 NOTE — Consult Note (Signed)
[facility] [facility] Consultation     Date: 11/14/2015,   MRN# [id number] GRAYLEN NOBOA [birth year] Code Status:     Code Status Orders        Start     Ordered   11/13/15 0605  Full code   Continuous     11/13/15 0604     Hosp day:@LENGTHOFSTAYDAYS @ Referring MD: @ATDPROV @     PCP:      AdmissionWeight: 185 lb (83.915 kg)                 CurrentWeight: 185 lb (83.915 kg)   CC etoh withdrawal   HPI:  admitted for etoh withdrawal, will wean off precedex today On CIWA, xanax tid, tolerating oral diet  Home Medication:  No current outpatient prescriptions on file.  Current Medication:   Current facility-administered medications:  .  0.9 %  sodium chloride infusion, , Intravenous, Continuous, , MD, Last Rate: 125 mL/hr at 11/13/15 1353 .  acetaminophen (TYLENOL) tablet 650 mg, 650 mg, Oral, Q6H PRN, 11/15/15, MD .  ALPRAZolam Delfino Lovett) tablet 1 mg, 1 mg, Oral, TID, Prudy Feeler, MD, 1 mg at 11/13/15 2138 .  amitriptyline (ELAVIL) tablet 100 mg, 100 mg, Oral, QHS, 2139, MD, 100 mg at 11/13/15 2138 .  docusate sodium (COLACE) capsule 100 mg, 100 mg, Oral, BID, 2139, MD, 100 mg at 11/13/15 2138 .  heparin injection 5,000 Units, 5,000 Units, Subcutaneous, 3 times per day, 2139, MD, 5,000 Units at 11/14/15 0600 .  LORazepam (ATIVAN) injection 1-2 mg, 1-2 mg, Intravenous, Q1H PRN, 11/16/15, MD, 2 mg at 11/13/15 2139 .  morphine 2 MG/ML injection 1 mg, 1 mg, Intravenous, Q4H PRN, 2140, MD, 1 mg at 11/13/15 1550 .  ondansetron (ZOFRAN) tablet 4 mg, 4 mg, Oral, Q6H PRN **OR** ondansetron (ZOFRAN) injection 4 mg, 4 mg, Intravenous, Q6H PRN, 11/15/15, MD, 4 mg at 11/13/15 0841 .  promethazine (PHENERGAN) injection 25 mg, 25 mg, Intravenous, Q6H PRN, 11/15/15, MD, 25 mg at 11/13/15 1354 .  sodium chloride 0.9 % injection 3 mL, 3 mL, Intravenous, Q12H, 11/15/15, MD, 3 mL at  11/13/15 2138     ALLERGIES   Sulfa antibiotics     REVIEW OF SYSTEMS   Review of Systems  Constitutional: Negative.  Negative for fever and chills.  Respiratory: Negative.  Negative for shortness of breath.   Cardiovascular: Negative.   Gastrointestinal: Negative.  Negative for nausea and vomiting.  Neurological: Positive for headaches.  Psychiatric/Behavioral: The patient is nervous/anxious.      VS: BP 89/63 mmHg  Pulse 77  Temp(Src) 98.6 F (37 C) (Oral)  Resp 8  Ht 6\' 2"  (1.88 m)  Wt 185 lb (83.915 kg)  BMI 23.74 kg/m2  SpO2 100%     PHYSICAL EXAM   Physical Exam  Constitutional: He is oriented to person, place, and time. No distress.  HENT:  Head: Normocephalic and atraumatic.  Eyes: Pupils are equal, round, and reactive to light.  Neck: Neck supple.  Cardiovascular: Normal rate, regular rhythm and normal heart sounds.   Pulmonary/Chest: Effort normal and breath sounds normal. No respiratory distress. He has no wheezes.  Abdominal: Soft. Bowel sounds are normal.  Musculoskeletal: He exhibits no edema.  Neurological: He is alert and oriented to person, place, and time.  Skin: Skin is warm. He is not diaphoretic.        LABS  Recent Labs     11/13/15  0131  HGB  17.2  HCT  51.0  MCV  88.9  WBC  25.5*  BUN  15  CREATININE  1.45*  GLUCOSE  136*  CALCIUM  8.9  ,    No results for input(s): PH in the last 72 hours.  Invalid input(s): PCO2, PO2, BASEEXCESS, BASEDEFICITE, TFT    CULTURE RESULTS   Recent Results (from the past 240 hour(s))  MRSA PCR Screening     Status: None   Collection Time: 11/13/15 12:04 AM  Result Value Ref Range Status   MRSA by PCR NEGATIVE NEGATIVE Final    Comment:        The GeneXpert MRSA Assay (FDA approved for NASAL specimens only), is one component of a comprehensive MRSA colonization surveillance program. It is not intended to diagnose MRSA infection nor to guide or monitor treatment for MRSA  infections.   Culture, blood (routine x 2)     Status: None (Preliminary result)   Collection Time: 11/13/15  7:17 AM  Result Value Ref Range Status   Specimen Description BLOOD RIGHT ARM  Final   Special Requests   Final    BOTTLES DRAWN AEROBIC AND ANAEROBIC  7CC ANAERO 6CC AERO   Culture NO GROWTH < 12 HOURS  Final   Report Status PENDING  Incomplete  Culture, blood (routine x 2)     Status: None (Preliminary result)   Collection Time: 11/13/15  7:26 AM  Result Value Ref Range Status   Specimen Description BLOOD RIGHT HAND  Final   Special Requests   Final    BOTTLES DRAWN AEROBIC AND ANAEROBIC  2CC AERO 6CC ANAERO   Culture NO GROWTH < 12 HOURS  Final   Report Status PENDING  Incomplete           ASSESSMENT/PLAN   44 yo white male with ETOh withdrawal  1.wean off precedex 2.CIWA protocol 3.xanax tid  Continue supportive care and management  Lucie Leather, M.D.  [facility]  Medical Director [facility] Medical Director [facility] Cardio-Pulmonary Department

## 2015-11-14 NOTE — Care Management (Signed)
Admitted to icu on precedex infusion for alcohol withdrawal.  Precedex has been discontinued.  Patient level of care is now stepdown and anticipate to transfer out of icu in 24 hours

## 2015-11-15 MED ORDER — THIAMINE HCL 100 MG/ML IJ SOLN: 100.0000 mg | Freq: Every day | INTRAMUSCULAR | Status: DC

## 2015-11-15 MED ORDER — ADULT MULTIVITAMIN W/MINERALS CH
1.0000 | ORAL_TABLET | Freq: Every day | ORAL | Status: DC
  Administered 2015-11-16 – 2015-11-18 (×3): 1 via ORAL
  Filled 2015-11-15 (×3): qty 1

## 2015-11-15 MED ORDER — LORAZEPAM 2 MG/ML IJ SOLN
1.0000 mg | Freq: Four times a day (QID) | INTRAMUSCULAR | Status: AC | PRN
  Administered 2015-11-16 – 2015-11-17 (×2): 1 mg via INTRAVENOUS
  Filled 2015-11-15 (×2): qty 1

## 2015-11-15 MED ORDER — FOLIC ACID 1 MG PO TABS
1.0000 mg | ORAL_TABLET | Freq: Every day | ORAL | Status: DC
  Administered 2015-11-16 – 2015-11-18 (×3): 1 mg via ORAL
  Filled 2015-11-15 (×3): qty 1

## 2015-11-15 MED ORDER — LORAZEPAM 1 MG PO TABS: 1.0000 mg | ORAL_TABLET | Freq: Four times a day (QID) | ORAL | Status: AC | PRN

## 2015-11-15 MED ORDER — VITAMIN B-1 100 MG PO TABS
100.0000 mg | ORAL_TABLET | Freq: Every day | ORAL | Status: DC
  Administered 2015-11-16 – 2015-11-18 (×3): 100 mg via ORAL
  Filled 2015-11-15 (×3): qty 1

## 2015-11-15 NOTE — Progress Notes (Signed)
Patient placed on IVC suicide precautions. Room set up for suicide precautions. Cell phone, charger, pants, and navy blue jacket put in bag at nurses station and given to [place], UC. Sitter at bedside within arms length. Patient informed and cooperative.Will continue to monitor.  Ron Parker, RN

## 2015-11-15 NOTE — Consult Note (Signed)
[facility] Face-to-Face Psychiatry Consult   Reason for Consult:  Follow-up consult for this 44 year old man with a history of multiple drug abuse most recently heavy alcohol abuse. Having delirium tremens. Referring Physician:  Sherryll Burger Patient Identification: Curtis Cobb MRN:  [id number] Principal Diagnosis: Alcohol use disorder, severe, dependence (HCC) Diagnosis:   Patient Active Problem List   Diagnosis Date Noted  . Alcohol withdrawal (HCC) [F10.239] 11/13/2015  . Opioid use disorder, mild, in sustained remission [F11.90] 10/08/2015  . Social phobia [F40.10] 10/08/2015  . 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 Major depressive disorder, recurrent, moderate (HCC) [F33.1] 10/08/2015  . Alcohol use disorder, severe, dependence (HCC) [F10.20] 10/07/2015  . Cannabis use disorder, severe, dependence (HCC) [F12.20] 10/07/2015  . 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 HTN (hypertension) [I10] 10/07/2015  . Rheumatoid arthritis (HCC) [M06.9] 10/07/2015    Total Time spent with patient: 45 minutes  Subjective:   Curtis Cobb is a 44 y.o. male patient admitted with "I'm just breathing".  HPI:  Information from the patient and the chart. I actually saw the patient yesterday as well when he was still in the critical care unit and at that time he was still confused. On reevaluation today the patient presents as confused and unable to give a consistent history. He does tell me that he still has suicidal thoughts that "come and go" in his mind. He also tells me that his mood doesn't feel good. Patient is waxing and waning in his mental state and looks very confused during the interview. Patient for some reason was started on standing doses of Xanax and Suboxone, neither of which are part of his outpatient medications and neither of which would be indicated. This may be contributing to his oversedated nature.  Patient's friend who is very concerned  about him visited today and is upset about any possibility of the patient being discharged  Past Psychiatric History: Long history of psychiatric problems including mood disorder anxiety disorder and substance abuse. History of excessive use of drugs resulting in near fatal outcomes although it's not clear whether he has been frankly suicidal  Risk to Self: Is patient at risk for suicide?: No Risk to Others:   Prior Inpatient Therapy:   Prior Outpatient Therapy:    Past Medical History:  Past Medical History  Diagnosis Date  . Bipolar 1 disorder (HCC)   . Schizophrenia (HCC)   . Depression   . Anxiety   . Rheumatoid arthritis (HCC)   . Hypertension   . Schizoaffective disorder, depressive type (HCC) 10/06/2015  . Alcohol use disorder, moderate, dependence (HCC) 02/13/2014   History reviewed. No pertinent past surgical history. Family History: No family history on file. Family Psychiatric  History: Positive family history for anxiety problems Social History:  History  Alcohol Use  . Yes    Comment: gallon/day     History  Drug Use  . Yes  . Special: Amphetamines    Comment: THC    Social History   Social History  . Marital Status: Single    Spouse Name: N/A  . Number of Children: N/A  . Years of Education: N/A   Social History Main Topics  . Smoking status: Former Smoker -- 0.00 packs/day for 0 years  . Smokeless tobacco: None  . Alcohol Use: Yes     Comment: gallon/day  . Drug Use: Yes    Special: Amphetamines     Comment: THC  . Sexual Activity: Yes  Birth Control/ Protection: None   Other Topics Concern  . None   Social History Narrative   Additional Social History:                          Allergies:   Allergies  Allergen Reactions  . Sulfa Antibiotics Other (See Comments)    Unknown reaction    Labs: No results found for this or any previous visit (from the past 48 hour(s)).  Current Facility-Administered Medications  Medication  Dose Route Frequency Provider Last Rate Last Dose  . 0.9 %  sodium chloride infusion   Intravenous Continuous Arnaldo Natal, MD 125 mL/hr at 11/15/15 1343    . acetaminophen (TYLENOL) tablet 650 mg  650 mg Oral Q6H PRN Delfino Lovett, MD      . amitriptyline (ELAVIL) tablet 100 mg  100 mg Oral QHS Arnaldo Natal, MD   100 mg at 11/14/15 2136  . docusate sodium (COLACE) capsule 100 mg  100 mg Oral BID Arnaldo Natal, MD   100 mg at 11/15/15 0844  . folic acid (FOLVITE) tablet 1 mg  1 mg Oral Daily Vipul Shah, MD   1 mg at 11/15/15 1445  . heparin injection 5,000 Units  5,000 Units Subcutaneous 3 times per day Arnaldo Natal, MD   5,000 Units at 11/15/15 1400  . LORazepam (ATIVAN) tablet 1 mg  1 mg Oral Q6H PRN Delfino Lovett, MD       Or  . LORazepam (ATIVAN) injection 1 mg  1 mg Intravenous Q6H PRN Delfino Lovett, MD      . multivitamin with minerals tablet 1 tablet  1 tablet Oral Daily Erin Fulling, MD   1 tablet at 11/15/15 0844  . multivitamin with minerals tablet 1 tablet  1 tablet Oral Daily Delfino Lovett, MD   1 tablet at 11/15/15 1445  . ondansetron (ZOFRAN) tablet 4 mg  4 mg Oral Q6H PRN Arnaldo Natal, MD       Or  . ondansetron [facility]) injection 4 mg  4 mg Intravenous Q6H PRN Arnaldo Natal, MD   4 mg at 11/13/15 0841  . promethazine (PHENERGAN) injection 25 mg  25 mg Intravenous Q6H PRN Delfino Lovett, MD   25 mg at 11/13/15 1354  . sodium chloride 0.9 % injection 3 mL  3 mL Intravenous Q12H Arnaldo Natal, MD   3 mL at 11/14/15 2008  . thiamine (VITAMIN B-1) tablet 100 mg  100 mg Oral Daily Vipul Shah, MD       Or  . thiamine (B-1) injection 100 mg  100 mg Intravenous Daily Vipul Sherryll Burger, MD   100 mg at 11/15/15 1445    Musculoskeletal: Strength & Muscle Tone: decreased Gait & Station: unsteady Patient leans: N/A  Psychiatric Specialty Exam: Review of Systems  Unable to perform ROS: mental acuity    Blood pressure 126/85, pulse 89, temperature 97.6 F (36.4 C), temperature  source Oral, resp. rate 16, height 6\' 2"  (1.88 m), weight 87.907 kg (193 lb 12.8 oz), SpO2 99 %.Body mass index is 24.87 kg/(m^2).  General Appearance: Disheveled  Eye Contact::  Poor  Speech:  Slow  Volume:  Decreased  Mood:  Dysphoric  Affect:  Labile  Thought Process:  Circumstantial, Loose and Tangential  Orientation:  Negative  Thought Content:  Hallucinations: Visual  Suicidal Thoughts:  Yes.  without intent/plan  Homicidal Thoughts:  No  Memory:  Immediate;   Poor  Recent;   Poor Remote;   Poor  Judgement:  Impaired  Insight:  Lacking  Psychomotor Activity:  Psychomotor Retardation  Concentration:  Poor  Recall:  Poor  Fund of Knowledge:Poor  Language: Poor  Akathisia:  No  Handed:  Right  AIMS (if indicated):     Assets:  Physical Health Social Support  ADL's:  Impaired  Cognition: Impaired,  Moderate  Sleep:      Treatment Plan Summary: Daily contact with patient to assess and evaluate symptoms and progress in treatment, Medication management and Plan 44 year old man who on my evaluation today's frankly delirious. Unable to engage in a reasonable conversation about past or current symptoms or behavior. Waxing and waning mental state. Appears to be intermittently oversedated. Does say that he has intermittent suicidal ideation. When I ask him if he would be willing to be admitted to the psychiatry ward he said he would not. Family is very concerned about his safety. At this point I do not think he is safe to be discharged from the hospital. I'm going to discontinue the Xanax and the Suboxone leaving him just on as needed doses of Ativan for withdrawal. He is also on the 100 mg at night of amitriptyline. I have filed involuntary commitment paperwork to keep him from leaving the hospital. Patient needs to be reevaluated over the weekend and if he is medically stable enough and still dangerous he should be transferred to the psychiatry ward.  Disposition: Recommend psychiatric  Inpatient admission when medically cleared. Supportive therapy provided about ongoing stressors.  John Clapacs 11/15/2015 3:03 PM

## 2015-11-15 NOTE — Progress Notes (Signed)
[facility] - [place] at [facility]   PATIENT NAME: Curtis Cobb    MR#:  [id number]  DATE OF BIRTH:  [birth year]  SUBJECTIVE:  CHIEF COMPLAINT:   Chief Complaint  Patient presents with  . Alcohol Intoxication  again very shaky, tremulous - seem to be going thru withdrawal. GirlFriend at bedside  REVIEW OF SYSTEMS:  Review of Systems  Constitutional: Negative for fever, weight loss, malaise/fatigue and diaphoresis.  HENT: Negative for ear discharge, ear pain, hearing loss, nosebleeds, sore throat and tinnitus.   Eyes: Negative for blurred vision and pain.  Respiratory: Negative for cough, hemoptysis, shortness of breath and wheezing.   Cardiovascular: Negative for chest pain, palpitations, orthopnea and leg swelling.  Gastrointestinal: Negative for heartburn, nausea, vomiting, abdominal pain, diarrhea, constipation and blood in stool.  Genitourinary: Negative for dysuria, urgency and frequency.  Musculoskeletal: Negative for myalgias and back pain.  Skin: Negative for itching and rash.  Neurological: Positive for tremors. Negative for dizziness, tingling, focal weakness, seizures, weakness and headaches.  Psychiatric/Behavioral: Negative for depression.   DRUG ALLERGIES:   Allergies  Allergen Reactions  . Sulfa Antibiotics Other (See Comments)    Unknown reaction   VITALS:  Blood pressure 126/85, pulse 89, temperature 97.6 F (36.4 C), temperature source Oral, resp. rate 16, height 6\' 2"  (1.88 m), weight 87.907 kg (193 lb 12.8 oz), SpO2 99 %. PHYSICAL EXAMINATION:  Physical Exam  Constitutional: He is oriented to person, place, and time and well-developed, well-nourished, and in no distress.  HENT:  Head: Normocephalic and atraumatic.  Eyes: Conjunctivae and EOM are normal. Pupils are equal, round, and reactive to light.  Neck: Normal range of motion. Neck supple. No tracheal deviation present. No thyromegaly present.  Cardiovascular: Normal rate,  regular rhythm and normal heart sounds.   Pulmonary/Chest: Effort normal and breath sounds normal. No respiratory distress. He has no wheezes. He exhibits no tenderness.  Abdominal: Soft. Bowel sounds are normal. He exhibits no distension. There is no tenderness.  Musculoskeletal: Normal range of motion.  Neurological: He is alert and oriented to person, place, and time. No cranial nerve deficit.  Skin: Skin is warm and dry. No rash noted.  Psychiatric: He is agitated. He exhibits ordered thought content.   LABORATORY PANEL:   CBC  Recent Labs Lab 11/13/15 0131  WBC 25.5*  HGB 17.2  HCT 51.0  PLT 420   ------------------------------------------------------------------------------------------------------------------ Chemistries   Recent Labs Lab 11/13/15 0131  NA 140  K 3.9  CL 104  CO2 16*  GLUCOSE 136*  BUN 15  CREATININE 1.45*  CALCIUM 8.9  AST 75*  ALT 99*  ALKPHOS 75  BILITOT 0.8   RADIOLOGY:  No results found. ASSESSMENT AND PLAN:  This is a 44 year old Caucasian male admitted for delirium tremens and alcohol abuse.  1. Delirium tremens: Off Precedex drip - continue CIWA protocol. Waxing and waning mental state. Appears to be intermittently oversedated. Does say that he has intermittent suicidal ideation. (Girl friend) Family is very concerned about his safety. At this point I do not think he is safe to be discharged from the hospital. Psych has discontinued the Xanax and the Suboxone leaving him just on as needed doses of Ativan for withdrawal. He is also on the 100 mg at night of amitriptyline. They have also filed involuntary commitment paperwork to keep him from leaving the hospital. Once medically stable, he can be transferred to the psychiatry ward.  2. Sepsis: Ruled out.  Stop  Antibiotics  3. Acute kidney injury: Mild, likely secondary to dehydration. Hydrate with intravenous fluid  4. Transaminitis: Secondary to alcohol abuse  5. Alcohol abuse:  Patient is interested in detox therapy once he is stabilized  6. GI prophylaxis: Pantoprazole  7. DVT prolactins: Heparin    All the records are reviewed and case discussed with Care Management/Social Worker. Management plans discussed with the patient, family and they are in agreement.  CODE STATUS: Full Code  TOTAL TIME TAKING CARE OF THIS PATIENT: 35 minutes.   More than 50% of the time was spent in counseling/coordination of care: YES (updated girl friend Curtis Cobb about Psych's decision and she is happy to hear that)  POSSIBLE D/C IN 1-2 DAYS, DEPENDING ON CLINICAL CONDITION.   [facility], Merlinda Wrubel M.D on 11/15/2015 at 3:53 PM  Between 7am to 6pm - Pager - [phone]  After 6pm go to [email or web address] - password [email or web address] [email or web address]  [facility]  Office  [phone]  CC:  Primary care physician; No primary care provider on file.

## 2015-11-16 LAB — CBC
HCT: 35.9 % — ABNORMAL LOW (ref 40.0–52.0)
Hemoglobin: 12.1 g/dL — ABNORMAL LOW (ref 13.0–18.0)
MCH: 30.4 pg (ref 26.0–34.0)
MCHC: 33.7 g/dL (ref 32.0–36.0)
MCV: 90.1 fL (ref 80.0–100.0)
PLATELETS: 220 10*3/uL (ref 150–440)
RBC: 3.98 MIL/uL — ABNORMAL LOW (ref 4.40–5.90)
RDW: 12.3 % (ref 11.5–14.5)
WBC: 7.6 10*3/uL (ref 3.8–10.6)

## 2015-11-16 MED ORDER — ZOLPIDEM TARTRATE 5 MG PO TABS
5.0000 mg | ORAL_TABLET | Freq: Every evening | ORAL | Status: DC | PRN
  Administered 2015-11-16 – 2015-11-17 (×2): 5 mg via ORAL
  Filled 2015-11-16 (×2): qty 1

## 2015-11-16 MED ORDER — IBUPROFEN 400 MG PO TABS
800.0000 mg | ORAL_TABLET | Freq: Three times a day (TID) | ORAL | Status: DC | PRN
  Administered 2015-11-16 – 2015-11-18 (×3): 800 mg via ORAL
  Filled 2015-11-16 (×3): qty 2

## 2015-11-16 MED ORDER — OXYCODONE-ACETAMINOPHEN 5-325 MG PO TABS
1.0000 | ORAL_TABLET | Freq: Four times a day (QID) | ORAL | Status: DC | PRN
  Administered 2015-11-16 – 2015-11-18 (×3): 1 via ORAL
  Filled 2015-11-16 (×4): qty 1

## 2015-11-16 MED ORDER — IBUPROFEN 100 MG/5ML PO SUSP
800.0000 mg | Freq: Three times a day (TID) | ORAL | Status: DC | PRN
  Filled 2015-11-16: qty 40

## 2015-11-16 MED ORDER — CYCLOBENZAPRINE HCL 10 MG PO TABS
5.0000 mg | ORAL_TABLET | Freq: Once | ORAL | Status: AC
  Administered 2015-11-16: 5 mg via ORAL
  Filled 2015-11-16 (×2): qty 1

## 2015-11-16 MED ORDER — OXYCODONE-ACETAMINOPHEN 5-325 MG PO TABS: 1.0000 | ORAL_TABLET | Freq: Four times a day (QID) | ORAL | Status: DC | PRN

## 2015-11-16 NOTE — Progress Notes (Signed)
[facility] Physicians -  at [facility]   PATIENT NAME: Curtis Cobb    MR#:  [id number]  DATE OF BIRTH:  1971/12/26  SUBJECTIVE:  CHIEF COMPLAINT:   Chief Complaint  Patient presents with  . Alcohol Intoxication   Completely alert and oriented and calm, he said he has pain from his left thigh up to his left hip pain which is chronic since many years it feels like cramp or shooting pain.  REVIEW OF SYSTEMS:  Review of Systems  Constitutional: Negative for fever, weight loss, malaise/fatigue and diaphoresis.  HENT: Negative for ear discharge, ear pain, hearing loss, nosebleeds, sore throat and tinnitus.   Eyes: Negative for blurred vision and pain.  Respiratory: Negative for cough, hemoptysis, shortness of breath and wheezing.   Cardiovascular: Negative for chest pain, palpitations, orthopnea and leg swelling.  Gastrointestinal: Negative for heartburn, nausea, vomiting, abdominal pain, diarrhea, constipation and blood in stool.  Genitourinary: Negative for dysuria, urgency and frequency.  Musculoskeletal: Negative for myalgias and back pain.       Left side thigh and knee pain, mostly due to shooting nerve.  Skin: Negative for itching and rash.  Neurological: Negative for dizziness, tingling, tremors, focal weakness, seizures, weakness and headaches.  Psychiatric/Behavioral: Negative for depression.   DRUG ALLERGIES:   Allergies  Allergen Reactions  . Sulfa Antibiotics Other (See Comments)    Unknown reaction   VITALS:  Blood pressure 135/87, pulse 92, temperature 98.6 F (37 C), temperature source Oral, resp. rate 18, height 6\' 2"  (1.88 m), weight 88.814 kg (195 lb 12.8 oz), SpO2 98 %. PHYSICAL EXAMINATION:  Physical Exam  Constitutional: He is oriented to person, place, and time and well-developed, well-nourished, and in no distress.  HENT:  Head: Normocephalic and atraumatic.  Eyes: Conjunctivae and EOM are normal. Pupils are equal, round, and  reactive to light.  Neck: Normal range of motion. Neck supple. No tracheal deviation present. No thyromegaly present.  Cardiovascular: Normal rate, regular rhythm and normal heart sounds.   Pulmonary/Chest: Effort normal and breath sounds normal. No respiratory distress. He has no wheezes. He exhibits no tenderness.  Abdominal: Soft. Bowel sounds are normal. He exhibits no distension. There is no tenderness.  Musculoskeletal: Normal range of motion.  Neurological: He is alert and oriented to person, place, and time. No cranial nerve deficit.  Skin: Skin is warm and dry. No rash noted.  Psychiatric: He is not agitated. He exhibits ordered thought content.   LABORATORY PANEL:   CBC  Recent Labs Lab 11/16/15 0620  WBC 7.6  HGB 12.1*  HCT 35.9*  PLT 220   ------------------------------------------------------------------------------------------------------------------ Chemistries   Recent Labs Lab 11/13/15 0131  NA 140  K 3.9  CL 104  CO2 16*  GLUCOSE 136*  BUN 15  CREATININE 1.45*  CALCIUM 8.9  AST 75*  ALT 99*  ALKPHOS 75  BILITOT 0.8   RADIOLOGY:  No results found. ASSESSMENT AND PLAN:  This is a 44 year old Caucasian male admitted for delirium tremens and alcohol abuse.  1. Delirium tremens: Off Precedex drip - continued CIWA protocol.  Does say that he has intermittent suicidal ideation.  on as needed doses of Ativan for withdrawal.  He did not required any more ativen since morning, and not any agitation.   Medically he is stable to send to psych floor.   I let the floor incharge know about that.   Will wait for psych follow up, and Dr. 45 to decide about his transfer.  2. Sepsis: Ruled out.  Stop  Antibiotics  3. Acute kidney injury: Mild, likely secondary to dehydration. Hydrate with intravenous fluid   Recheck tomorrow.  4. Transaminitis: Secondary to alcohol abuse  5. Alcohol abuse: Patient is interested in detox therapy once he is  stabilized  6. GI prophylaxis: Pantoprazole  7. DVT prolactins: Heparin    All the records are reviewed and case discussed with Care Management/Social Worker. Management plans discussed with the patient, family and they are in agreement.  CODE STATUS: Full Code  TOTAL TIME TAKING CARE OF THIS PATIENT: 25 minutes.   POSSIBLE D/C IN 1-2 DAYS, DEPENDING ON CLINICAL CONDITION.   Altamese Dilling M.D on 11/16/2015 at 4:56 PM  Between 7am to 6pm - Pager - [phone]  After 6pm go to [email or web address] - password [email or web address] [facility]  [facility] [place] [facility]  Office  [phone]  CC:  Primary care physician; No primary care provider on file.

## 2015-11-16 NOTE — Progress Notes (Signed)
11/16/2015 11:36 AM  Dr. Elisabeth Pigeon notified of severe leg pain. Percocet 1 tablet and one time dose Flexeril ordered.   Ron Parker, RN

## 2015-11-17 DIAGNOSIS — F332 Major depressive disorder, recurrent severe without psychotic features: Secondary | ICD-10-CM | POA: Diagnosis present

## 2015-11-17 LAB — BASIC METABOLIC PANEL
ANION GAP: 6 (ref 5–15)
BUN: 9 mg/dL (ref 6–20)
CHLORIDE: 104 mmol/L (ref 101–111)
CO2: 26 mmol/L (ref 22–32)
Calcium: 8.5 mg/dL — ABNORMAL LOW (ref 8.9–10.3)
Creatinine, Ser: 0.72 mg/dL (ref 0.61–1.24)
GFR calc non Af Amer: >60 mL/min (ref >60)
Glucose, Bld: 93 mg/dL (ref 65–99)
POTASSIUM: 3.8 mmol/L (ref 3.5–5.1)
Sodium: 136 mmol/L (ref 135–145)

## 2015-11-17 LAB — RAPID HIV SCREEN (HIV 1/2 AB+AG)
HIV 1/2 ANTIBODIES: NONREACTIVE
HIV-1 P24 ANTIGEN - HIV24: NONREACTIVE

## 2015-11-17 MED ORDER — ACETAMINOPHEN 325 MG PO TABS: 650.0000 mg | ORAL_TABLET | Freq: Four times a day (QID) | ORAL | Status: DC | PRN

## 2015-11-17 MED ORDER — IBUPROFEN 800 MG PO TABS: 800.0000 mg | ORAL_TABLET | Freq: Three times a day (TID) | ORAL | Status: DC | PRN

## 2015-11-17 MED ORDER — ZOLPIDEM TARTRATE 5 MG PO TABS: 5.0000 mg | ORAL_TABLET | Freq: Every evening | ORAL | Status: DC | PRN

## 2015-11-17 MED ORDER — ADULT MULTIVITAMIN W/MINERALS CH: 1.0000 | ORAL_TABLET | Freq: Every day | ORAL | Status: DC

## 2015-11-17 MED ORDER — MAGNESIUM HYDROXIDE 400 MG/5ML PO SUSP: 30.0000 mL | Freq: Every day | ORAL | Status: DC | PRN

## 2015-11-17 MED ORDER — OXYCODONE-ACETAMINOPHEN 5-325 MG PO TABS: 1.0000 | ORAL_TABLET | Freq: Four times a day (QID) | ORAL | Status: DC | PRN

## 2015-11-17 MED ORDER — LORAZEPAM 1 MG PO TABS: 1.0000 mg | ORAL_TABLET | Freq: Every day | ORAL | Status: DC

## 2015-11-17 MED ORDER — AMITRIPTYLINE HCL 100 MG PO TABS: 100.0000 mg | ORAL_TABLET | Freq: Every day | ORAL | Status: DC

## 2015-11-17 MED ORDER — ALUM & MAG HYDROXIDE-SIMETH 200-200-20 MG/5ML PO SUSP: 30.0000 mL | ORAL | Status: DC | PRN

## 2015-11-17 NOTE — Consult Note (Signed)
  Pt seen in Follow up Consult in R.No 210. S Pt is seen with sitter. Staff reports that he is medically stable but still has IV going on.  O Alert and ox3. Calm and co-operative. No agitation. Affect is flat and mood is low and down and depressed. Feels h/h and feeling w/u. No psychosis. Does have suicidal ideas and wishes but contracts for safety  I/J guarded Imp MDD Recurrent moderate. Alcohol with drawl. Rec Transfer 'Admit pt to Inpt BHU after he is medically cleared and stable and bed is available.

## 2015-11-17 NOTE — Progress Notes (Signed)
[facility] - [place] at [facility]   PATIENT NAME: Curtis Cobb    MR#:  [id number]  DATE OF BIRTH:  Jan 28, 1971  SUBJECTIVE:  CHIEF COMPLAINT:   Chief Complaint  Patient presents with  . Alcohol Intoxication   Completely alert and oriented and calm, he said he has pain from his left thigh up to his left hip pain which is chronic since many years it feels like cramp or shooting pain. Able to walk to bathroom. No more tremers.  REVIEW OF SYSTEMS:  Review of Systems  Constitutional: Negative for fever, weight loss, malaise/fatigue and diaphoresis.  HENT: Negative for ear discharge, ear pain, hearing loss, sore throat and tinnitus.   Eyes: Negative for blurred vision and double vision.  Respiratory: Negative for cough, hemoptysis, shortness of breath and wheezing.   Cardiovascular: Negative for chest pain, palpitations, orthopnea and leg swelling.  Gastrointestinal: Negative for heartburn, nausea, vomiting, abdominal pain, diarrhea and constipation.  Genitourinary: Negative for dysuria, urgency and frequency.  Musculoskeletal: Negative for myalgias and back pain.       Left side thigh and knee pain, mostly due to shooting nerve.  Skin: Negative for itching and rash.  Neurological: Negative for dizziness, tingling, tremors, focal weakness, weakness and headaches.  Psychiatric/Behavioral: Negative for depression.   DRUG ALLERGIES:   Allergies  Allergen Reactions  . Sulfa Antibiotics Other (See Comments)    Unknown reaction   VITALS:  Blood pressure 133/81, pulse 95, temperature 97.7 F (36.5 C), temperature source Oral, resp. rate 20, height 6\' 2"  (1.88 m), weight 86.229 kg (190 lb 1.6 oz), SpO2 99 %. PHYSICAL EXAMINATION:  Physical Exam  Constitutional: He is oriented to person, place, and time and well-developed, well-nourished, and in no distress.  HENT:  Head: Normocephalic and atraumatic.  Eyes: Conjunctivae and EOM are normal. Pupils are equal,  round, and reactive to light.  Neck: Normal range of motion. Neck supple. No tracheal deviation present. No thyromegaly present.  Cardiovascular: Normal rate, regular rhythm and normal heart sounds.   Pulmonary/Chest: Effort normal and breath sounds normal. No respiratory distress. He has no wheezes. He exhibits no tenderness.  Abdominal: Soft. Bowel sounds are normal. He exhibits no distension. There is no tenderness.  Musculoskeletal: Normal range of motion.  Neurological: He is alert and oriented to person, place, and time. No cranial nerve deficit.  No tremers.  Skin: Skin is warm and dry. No rash noted.  Psychiatric: He is not agitated. He exhibits ordered thought content.   LABORATORY PANEL:   CBC  Recent Labs Lab 11/16/15 0620  WBC 7.6  HGB 12.1*  HCT 35.9*  PLT 220   ------------------------------------------------------------------------------------------------------------------ Chemistries   Recent Labs Lab 11/13/15 0131 11/17/15 0409  NA 140 136  K 3.9 3.8  CL 104 104  CO2 16* 26  GLUCOSE 136* 93  BUN 15 9  CREATININE 1.45* 0.72  CALCIUM 8.9 8.5*  AST 75*  --   ALT 99*  --   ALKPHOS 75  --   BILITOT 0.8  --    RADIOLOGY:  No results found. ASSESSMENT AND PLAN:  This is a 44 year old Caucasian male admitted for delirium tremens and alcohol abuse.  1. Delirium tremens: Off Precedex drip - continued CIWA protocol.   he has intermittent suicidal ideation.  on as needed doses of Ativan for withdrawal.  He did not required any more ativen since morning, and not any agitation.   Said he feels mild agitation at evening time- scheduled  ativan in evening.   Medically stable   Will wait for psych follow up, awaited BHU bed.  2. Sepsis: Ruled out.  Stop  Antibiotics  3. Acute kidney injury: Mild, likely secondary to dehydration. Hydrate with intravenous fluid   Normal on recheck.  4. Transaminitis: Secondary to alcohol abuse  5. Alcohol abuse: Patient is  interested in detox therapy  6. GI prophylaxis: Pantoprazole  7. DVT prolactins: Heparin  8. Suicidal ideation- follow with Psych- need BHU admission.  All the records are reviewed and case discussed with Care Management/Social Worker. Management plans discussed with the patient, family and they are in agreement.  CODE STATUS: Full Code  TOTAL TIME TAKING CARE OF THIS PATIENT: 25 minutes.   POSSIBLE D/C IN 1-2 DAYS, DEPENDING ON CLINICAL CONDITION.   Altamese Dilling M.D on 11/17/2015 at 11:02 PM  Between 7am to 6pm - Pager - [phone]  After 6pm go to [email or web address] - password [email or web address] [facility]  [place] [facility]  Office  [phone]  CC:  Primary care physician; No primary care provider on file.

## 2015-11-18 ENCOUNTER — Inpatient Hospital Stay
Admit: 2015-11-18 | Discharge: 2015-11-20 | DRG: 885 | Disposition: A | Discharge status: Home / Self Care | Payer: No Typology Code available for payment source | Attending: Psychiatry | Admitting: Psychiatry

## 2015-11-18 DIAGNOSIS — M1990 Unspecified osteoarthritis, unspecified site: Secondary | ICD-10-CM | POA: Diagnosis present

## 2015-11-18 DIAGNOSIS — Z79899 Other long term (current) drug therapy: Secondary | ICD-10-CM

## 2015-11-18 DIAGNOSIS — R45851 Suicidal ideations: Secondary | ICD-10-CM | POA: Diagnosis present

## 2015-11-18 DIAGNOSIS — F410 Panic disorder [episodic paroxysmal anxiety] without agoraphobia: Secondary | ICD-10-CM | POA: Diagnosis present

## 2015-11-18 DIAGNOSIS — Z87891 Personal history of nicotine dependence: Secondary | ICD-10-CM

## 2015-11-18 DIAGNOSIS — I10 Essential (primary) hypertension: Secondary | ICD-10-CM | POA: Diagnosis present

## 2015-11-18 DIAGNOSIS — M069 Rheumatoid arthritis, unspecified: Secondary | ICD-10-CM | POA: Diagnosis present

## 2015-11-18 DIAGNOSIS — F10239 Alcohol dependence with withdrawal, unspecified: Secondary | ICD-10-CM | POA: Diagnosis present

## 2015-11-18 DIAGNOSIS — Z7983 Long term (current) use of bisphosphonates: Secondary | ICD-10-CM

## 2015-11-18 DIAGNOSIS — F1020 Alcohol dependence, uncomplicated: Secondary | ICD-10-CM | POA: Diagnosis present

## 2015-11-18 DIAGNOSIS — F332 Major depressive disorder, recurrent severe without psychotic features: Principal | ICD-10-CM | POA: Diagnosis present

## 2015-11-18 LAB — RAPID HIV SCREEN (HIV 1/2 AB+AG)
HIV 1/2 ANTIBODIES: NONREACTIVE
HIV-1 P24 Antigen - HIV24: NONREACTIVE

## 2015-11-18 MED ORDER — LORAZEPAM 1 MG PO TABS
1.0000 mg | ORAL_TABLET | Freq: Once | ORAL | Status: AC
  Administered 2015-11-18: 1 mg via ORAL
  Filled 2015-11-18: qty 1

## 2015-11-18 MED ORDER — ACETAMINOPHEN 325 MG PO TABS
650.0000 mg | ORAL_TABLET | Freq: Four times a day (QID) | ORAL | Status: DC | PRN
  Administered 2015-11-18 – 2015-11-19 (×2): 650 mg via ORAL
  Filled 2015-11-18 (×2): qty 2

## 2015-11-18 MED ORDER — ALUM & MAG HYDROXIDE-SIMETH 200-200-20 MG/5ML PO SUSP: 30.0000 mL | ORAL | Status: DC | PRN

## 2015-11-18 MED ORDER — MAGNESIUM HYDROXIDE 400 MG/5ML PO SUSP: 30.0000 mL | Freq: Every day | ORAL | Status: DC | PRN

## 2015-11-18 NOTE — Tx Team (Addendum)
Initial Interdisciplinary Treatment Plan   PATIENT STRESSORS: Financial difficulties Loss of job   PATIENT STRENGTHS: Average or above average intelligence Motivation for treatment/growth   PROBLEM LIST: Problem List/Patient Goals Date to be addressed Date deferred Reason deferred Estimated date of resolution  Depression 11/18/2015     Lack of job/financial stability 11/18/2015     Suicidal Attempt/Ideation 11/18/2015     Substance Abuse 11/18/2015                                    DISCHARGE CRITERIA:  Adequate post-discharge living arrangements  PRELIMINARY DISCHARGE PLAN: Outpatient therapy Return to previous living arrangement  PATIENT/FAMIILY INVOLVEMENT: This treatment plan has been presented to and reviewed with the patient, Curtis Cobb.  The patient and family have been given the opportunity to ask questions and make suggestions.  Curtis Cobb 11/18/2015, 6:05 PM

## 2015-11-18 NOTE — Progress Notes (Signed)
Lab reported that patient's blood culture is Gram Positive Rods, Dr. Toni Amend paged, awaiting a call back.

## 2015-11-18 NOTE — Progress Notes (Addendum)
Patient alert and oriented X4, was cooperative with admission assessment. Pt. Appeared and stated that he was anxious about being here. He however stated that he was ready to accept and work on his treatment and getting well. Patient voiced concerns of not being employed and being unable to afford medications when he gets discharged. Patient stated that he lives at a boarding house and can return too that living arrangement. Pt. Denied SI/HI and +ve AH/VH. He was encouraged to verbalize his feelings to nursing staff and he verbalized understanding. Patient was also oriented to the ward, ward rules and his room. He verbalized understanding.    Patient's belongings searched and no contrabands found. Some belongings placed in a locked locker.

## 2015-11-18 NOTE — Progress Notes (Signed)
Dr. Toni Amend advised to continue to monitor, chart reviewed, patient is asymptomatic no fever.

## 2015-11-18 NOTE — Discharge Summary (Signed)
[facility] - [place] at [facility]   PATIENT NAME: Curtis Cobb    MR#:  [id number]  DATE OF BIRTH:  [birth year]  DATE OF ADMISSION:  11/13/2015 ADMITTING PHYSICIAN: Arnaldo Natal, MD  DATE OF DISCHARGE: 11/18/2015  PRIMARY CARE PHYSICIAN: No primary care provider on file.    ADMISSION DIAGNOSIS:  Alcohol withdrawal, with unspecified complication (HCC) [F10.239] Toxic effect of isopropyl alcohol, undetermined intent, initial encounter [T51.2X4A]  DISCHARGE DIAGNOSIS:  Alcohol withdrawal with delirium tremens Acute depression with suicidal ideation  SECONDARY DIAGNOSIS:   Past Medical History  Diagnosis Date  . Bipolar 1 disorder (HCC)   . Schizophrenia (HCC)   . Depression   . Anxiety   . Rheumatoid arthritis (HCC)   . Hypertension   . Schizoaffective disorder, depressive type (HCC) 10/06/2015  . Alcohol use disorder, moderate, dependence (HCC) 02/13/2014    HOSPITAL COURSE:  1.. Delirium tremens: Off Precedex drip -  Improved with CIWA protocol.  he has intermittent suicidal ideation.  on as needed doses of Ativan for withdrawal.  Medically stable. Patient will be transferred to behavioral health unit once bed is available, discussed with on-call psychiatrist, reported that bedside available on psych floor, will discuss with the bed coordinator   2. Sepsis: Ruled out. Stoped Antibiotics  3. Acute kidney injury: Resolved with IV fluids   4. Transaminitis: Secondary to alcohol abuse  5. Alcohol abuse: Patient is interested in detox therapy, follow-up with psychiatry  6. GI prophylaxis: Pantoprazole  7. DVT prolactins: Heparin subcutaneous  8. Suicidal ideation- follow with Psych- need BHU admission. Currently one-on-one observation for safety   DISCHARGE CONDITIONS:   fair  CONSULTS OBTAINED:  Treatment Team:  Audery Amel, MD Ramonita Lab, MD   PROCEDURES none  DRUG ALLERGIES:   Allergies  Allergen Reactions  .  Sulfa Antibiotics Other (See Comments)    Unknown reaction    DISCHARGE MEDICATIONS:   Current Discharge Medication List    START taking these medications   Details  ibuprofen (ADVIL,MOTRIN) 800 MG tablet Take 1 tablet (800 mg total) by mouth every 8 (eight) hours as needed for moderate pain. Qty: 30 tablet, Refills: 0    LORazepam (ATIVAN) 1 MG tablet Take 1 tablet (1 mg total) by mouth daily at 6 PM. Qty: 10 tablet, Refills: 0    Multiple Vitamin (MULTIVITAMIN WITH MINERALS) TABS tablet Take 1 tablet by mouth daily. Qty: 30 tablet, Refills: 0    oxyCODONE-acetaminophen (PERCOCET/ROXICET) 5-325 MG tablet Take 1 tablet by mouth every 6 (six) hours as needed for moderate pain. Qty: 20 tablet, Refills: 0    zolpidem (AMBIEN) 5 MG tablet Take 1 tablet (5 mg total) by mouth at bedtime as needed for sleep. Qty: 20 tablet, Refills: 0      CONTINUE these medications which have CHANGED   Details  amitriptyline (ELAVIL) 100 MG tablet Take 1 tablet (100 mg total) by mouth at bedtime. Qty: 7 tablet, Refills: 0      CONTINUE these medications which have NOT CHANGED   Details  amLODipine (NORVASC) 2.5 MG tablet Take 1 tablet (2.5 mg total) by mouth daily. Qty: 7 tablet, Refills: 0    gabapentin (NEURONTIN) 100 MG capsule Take 1 capsule (100 mg total) by mouth 3 (three) times daily. Qty: 21 capsule, Refills: 0      STOP taking these medications     predniSONE (DELTASONE) 20 MG tablet          DISCHARGE INSTRUCTIONS:  Activity as tolerated Follow-up with primary care physician in a week after discharge from the psych unit  DIET:  Regular diet  DISCHARGE CONDITION:  Fair  ACTIVITY:  Activity as tolerated  OXYGEN:  Home Oxygen: No.   Oxygen Delivery: room air  DISCHARGE LOCATION:  BHU when bed is available  If you experience worsening of your admission symptoms, develop shortness of breath, life threatening emergency, suicidal or homicidal thoughts you must seek  medical attention immediately by calling 911 or calling your MD immediately  if symptoms less severe.  You Must read complete instructions/literature along with all the possible adverse reactions/side effects for all the Medicines you take and that have been prescribed to you. Take any new Medicines after you have completely understood and accpet all the possible adverse reactions/side effects.   Please note  You were cared for by a hospitalist during your hospital stay. If you have any questions about your discharge medications or the care you received while you were in the hospital after you are discharged, you can call the unit and asked to speak with the hospitalist on call if the hospitalist that took care of you is not available. Once you are discharged, your primary care physician will handle any further medical issues. Please note that NO REFILLS for any discharge medications will be authorized once you are discharged, as it is imperative that you return to your primary care physician (or establish a relationship with a primary care physician if you do not have one) for your aftercare needs so that they can reassess your need for medications and monitor your lab values.     Today  Chief Complaint  Patient presents with  . Alcohol Intoxication   The patient is resting comfortably. Has chronic low back pain Denies any complaints.  ROS:  CONSTITUTIONAL: Denies fevers, chills. Denies any fatigue, weakness.  EYES: Denies blurry vision, double vision, eye pain. EARS, NOSE, THROAT: Denies tinnitus, ear pain, hearing loss. RESPIRATORY: Denies cough, wheeze, shortness of breath.  CARDIOVASCULAR: Denies chest pain, palpitations, edema.  GASTROINTESTINAL: Denies nausea, vomiting, diarrhea, abdominal pain. Denies bright red blood per rectum. GENITOURINARY: Denies dysuria, hematuria. ENDOCRINE: Denies nocturia or thyroid problems. HEMATOLOGIC AND LYMPHATIC: Denies easy bruising or  bleeding. SKIN: Denies rash or lesion. MUSCULOSKELETAL: Denies pain in neck, back, shoulder, knees, hips or arthritic symptoms.  NEUROLOGIC: Denies paralysis, paresthesias.  PSYCHIATRIC: Denies anxiety or depressive symptoms.   VITAL SIGNS:  Blood pressure 131/87, pulse 116, temperature 97.6 F (36.4 C), temperature source Oral, resp. rate 20, height 6\' 2"  (1.88 m), weight 87.227 kg (192 lb 4.8 oz), SpO2 99 %.  I/O:   Intake/Output Summary (Last 24 hours) at 11/18/15 1321 Last data filed at 11/18/15 0651  Gross per 24 hour  Intake   2408 ml  Output      0 ml  Net   2408 ml    PHYSICAL EXAMINATION:  GENERAL:  44 y.o.-year-old patient lying in the bed with no acute distress.  EYES: Pupils equal, round, reactive to light and accommodation. No scleral icterus. Extraocular muscles intact.  HEENT: Head atraumatic, normocephalic. Oropharynx and nasopharynx clear.  NECK:  Supple, no jugular venous distention. No thyroid enlargement, no tenderness.  LUNGS: Normal breath sounds bilaterally, no wheezing, rales,rhonchi or crepitation. No use of accessory muscles of respiration.  CARDIOVASCULAR: S1, S2 normal. No murmurs, rubs, or gallops.  ABDOMEN: Soft, non-tender, non-distended. Bowel sounds present. No organomegaly or mass.  EXTREMITIES: No pedal edema, cyanosis, or clubbing.  NEUROLOGIC: Cranial nerves II through XII are intact. Muscle strength 5/5 in all extremities. Sensation intact. Gait not checked.  PSYCHIATRIC: The patient is alert and oriented x 3.  SKIN: No obvious rash, lesion, or ulcer.   DATA REVIEW:   CBC  Recent Labs Lab 11/16/15 0620  WBC 7.6  HGB 12.1*  HCT 35.9*  PLT 220    Chemistries   Recent Labs Lab 11/13/15 0131 11/17/15 0409  NA 140 136  K 3.9 3.8  CL 104 104  CO2 16* 26  GLUCOSE 136* 93  BUN 15 9  CREATININE 1.45* 0.72  CALCIUM 8.9 8.5*  AST 75*  --   ALT 99*  --   ALKPHOS 75  --   BILITOT 0.8  --     Cardiac Enzymes No results for  input(s): TROPONINI in the last 168 hours.  Microbiology Results  Results for orders placed or performed during the hospital encounter of 11/13/15  MRSA PCR Screening     Status: None   Collection Time: 11/13/15 12:04 AM  Result Value Ref Range Status   MRSA by PCR NEGATIVE NEGATIVE Final    Comment:        The GeneXpert MRSA Assay (FDA approved for NASAL specimens only), is one component of a comprehensive MRSA colonization surveillance program. It is not intended to diagnose MRSA infection nor to guide or monitor treatment for MRSA infections.   Culture, blood (routine x 2)     Status: None (Preliminary result)   Collection Time: 11/13/15  7:17 AM  Result Value Ref Range Status   Specimen Description BLOOD RIGHT ARM  Final   Special Requests   Final    BOTTLES DRAWN AEROBIC AND ANAEROBIC  7CC ANAERO 6CC AERO   Culture NO GROWTH 4 DAYS  Final   Report Status PENDING  Incomplete  Culture, blood (routine x 2)     Status: None (Preliminary result)   Collection Time: 11/13/15  7:26 AM  Result Value Ref Range Status   Specimen Description BLOOD RIGHT HAND  Final   Special Requests   Final    BOTTLES DRAWN AEROBIC AND ANAEROBIC  2CC AERO 6CC ANAERO   Culture NO GROWTH 4 DAYS  Final   Report Status PENDING  Incomplete    RADIOLOGY:  No results found.  EKG:   Orders placed or performed during the hospital encounter of 11/13/15  . EKG 12-Lead  . EKG 12-Lead      Management plans discussed with the patient, family and they are in agreement.  CODE STATUS:     Code Status Orders        Start     Ordered   11/17/15 1851  Full code   Continuous     11/17/15 1850      TOTAL TIME TAKING CARE OF THIS PATIENT: 45  minutes.    @  on 11/18/2015 at 1:21 PM  Between 7am to 6pm - Pager - [phone]  After 6pm go to [email or web address] - password [email or web address] [facility]  [facility]  Office  [phone]  CC: Primary care physician; No primary care provider on  file.

## 2015-11-18 NOTE — Progress Notes (Signed)
Report called to [facility] in [facility]. IV and Tele was removed. VSS. A & O x4. Discharge instructions and prescriptions were given to pt. PT was taken downstairs via Engineer, materials.

## 2015-11-18 NOTE — Plan of Care (Signed)
Problem: Ineffective individual coping Goal: STG: Patient will remain free from self harm Outcome: Progressing Tylenol 650 mg PO PRN given, 15 minute checks maintained for safety, clinical and moral support provided, patient encouraged to continue to express feelings and demonstrate safe care. Patient remain free from harm, will continue to monitor.

## 2015-11-18 NOTE — Progress Notes (Signed)
Quiet, pleasant, disheveled, evasive, endorsed SI but no plan, "I don't want to talk about it.." Tylenol 650 mg PO PRN for pain

## 2015-11-19 ENCOUNTER — Encounter: Payer: Self-pay | Admitting: Psychiatry

## 2015-11-19 DIAGNOSIS — F332 Major depressive disorder, recurrent severe without psychotic features: Principal | ICD-10-CM

## 2015-11-19 LAB — CULTURE, BLOOD (ROUTINE X 2): Culture: NO GROWTH

## 2015-11-19 MED ORDER — AMITRIPTYLINE HCL 100 MG PO TABS: 100.0000 mg | ORAL_TABLET | Freq: Every day | ORAL | Status: DC

## 2015-11-19 MED ORDER — LORAZEPAM 1 MG PO TABS
1.0000 mg | ORAL_TABLET | Freq: Every day | ORAL | Status: DC | PRN
  Administered 2015-11-19: 1 mg via ORAL
  Filled 2015-11-19 (×2): qty 1

## 2015-11-19 MED ORDER — AMITRIPTYLINE HCL 50 MG PO TABS
100.0000 mg | ORAL_TABLET | Freq: Every day | ORAL | Status: DC
  Administered 2015-11-19: 100 mg via ORAL
  Filled 2015-11-19 (×3): qty 2

## 2015-11-19 MED ORDER — IBUPROFEN 800 MG PO TABS
800.0000 mg | ORAL_TABLET | Freq: Three times a day (TID) | ORAL | Status: DC | PRN
Start: 2015-11-19 — End: 2015-11-20
  Administered 2015-11-19 – 2015-11-20 (×2): 800 mg via ORAL
  Filled 2015-11-19 (×2): qty 1

## 2015-11-19 MED ORDER — OXYCODONE-ACETAMINOPHEN 5-325 MG PO TABS
1.0000 | ORAL_TABLET | Freq: Three times a day (TID) | ORAL | Status: DC | PRN
  Administered 2015-11-19 – 2015-11-20 (×2): 1 via ORAL
  Filled 2015-11-19 (×2): qty 1

## 2015-11-19 MED ORDER — GABAPENTIN 100 MG PO CAPS: 100.0000 mg | ORAL_CAPSULE | Freq: Three times a day (TID) | ORAL | Status: DC

## 2015-11-19 MED ORDER — IBUPROFEN 800 MG PO TABS: 800.0000 mg | ORAL_TABLET | Freq: Three times a day (TID) | ORAL | Status: DC

## 2015-11-19 MED ORDER — AMLODIPINE BESYLATE 5 MG PO TABS: 2.5000 mg | ORAL_TABLET | Freq: Every day | ORAL | Status: DC

## 2015-11-19 NOTE — [facility] Suicide Risk Assessment (Signed)
[facility] Admission Suicide Risk Assessment   Nursing information obtained from:    Demographic factors:    Current Mental Status:    Loss Factors:    Historical Factors:    Risk Reduction Factors:    Total Time spent with patient: 1 hour Principal Problem: MDD (major depressive disorder), recurrent episode, severe (HCC) Diagnosis:   Patient Active Problem List   Diagnosis Date Noted  . MDD (major depressive disorder), recurrent episode, severe (HCC) [F33.2] 11/17/2015  . Alcohol withdrawal (HCC) [F10.239] 11/13/2015  . Opioid use disorder, mild, in sustained remission [F11.90] 10/08/2015  . Social phobia [F40.10] 10/08/2015  . 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 Alcohol use disorder, severe, dependence (HCC) [F10.20] 10/07/2015  . Cannabis use disorder, severe, dependence (HCC) [F12.20] 10/07/2015  . 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 HTN (hypertension) [I10] 10/07/2015  . Rheumatoid arthritis (HCC) [M06.9] 10/07/2015     Continued Clinical Symptoms:  Alcohol Use Disorder Identification Test Final Score (AUDIT): 28 The "Alcohol Use Disorders Identification Test", Guidelines for Use in Primary Care, Second Edition.  World Science writer [facility]). Score between 0-7:  no or low risk or alcohol related problems. Score between 8-15:  moderate risk of alcohol related problems. Score between 16-19:  high risk of alcohol related problems. Score 20 or above:  warrants further diagnostic evaluation for alcohol dependence and treatment.   CLINICAL FACTORS:   Depression:   Comorbid alcohol abuse/dependence Impulsivity Alcohol/Substance Abuse/Dependencies   Musculoskeletal: Strength & Muscle Tone: within normal limits Gait & Station: normal Patient leans: N/A  Psychiatric Specialty Exam: A review of physical exam performed on the medical floor and agree with her findings. Physical Exam  Nursing note and vitals  reviewed.   Review of Systems  Musculoskeletal: Positive for joint pain.  Psychiatric/Behavioral: Positive for depression and substance abuse.    Blood pressure 118/87, pulse 106, temperature 97.8 F (36.6 C), temperature source Oral, resp. rate 20, height 6\' 2"  (1.88 m), weight 85.73 kg (189 lb), SpO2 99 %.Body mass index is 24.26 kg/(m^2).  General Appearance: Casual  Eye Contact::  Good  Speech:  Clear and Coherent and Normal Rate  Volume:  Normal  Mood:  Anxious and Depressed  Affect:  Appropriate  Thought Process:  Goal Directed  Orientation:  Full (Time, Place, and Person)  Thought Content:  WDL  Suicidal Thoughts:  Yes.  with intent/plan  Homicidal Thoughts:  No  Memory:  Immediate;   Fair Recent;   Fair Remote;   Fair  Judgement:  Poor  Insight:  Shallow  Psychomotor Activity:  Normal  Concentration:  Fair  Recall:  [id number] of Knowledge:Fair  Language: Fair  Akathisia:  No  Handed:  Right  AIMS (if indicated):     Assets:  Communication Skills Desire for Improvement Housing Intimacy Resilience Social Support  Sleep:  Number of Hours: 7.45  Cognition: WNL  ADL's:  Intact     COGNITIVE FEATURES THAT CONTRIBUTE TO RISK:  None    SUICIDE RISK:   Moderate:  Frequent suicidal ideation with limited intensity, and duration, some specificity in terms of plans, no associated intent, good self-control, limited dysphoria/symptomatology, some risk factors present, and identifiable protective factors, including available and accessible social support.  PLAN OF CARE: Hospital admission, medication management, absence abuse counseling, discharge planning.  Medical Decision Making:  New problem, with additional work up planned, Review of Psycho-Social Stressors (1), Review or order clinical lab tests (1), Review of Medication  Regimen & Side Effects (2) and Review of New Medication or Change in Dosage (2)   Mr. Wissmann is a 44 year old male with a history of depression,  anxiety, alcoholism and suicidal ideation transferred from medical floor  where he was hospitalized for alcohol withdrawal delirium.   1. Suicidal ideation. The patient still has passing thoughts of suicide. He is able to contract for safety in the hospital.  2. Alcohol withdrawal. The patient completed alcohol detox on the medical floor. Vital signs are stable except for slight tachycardia. We'll monitor for symptoms of withdrawal.  3. Depression. He was restarted on Elavil.  4.  Anxiety. The patient was given as needed Ativan on medical floor we'll continue as needed Ativan..  5. Hypertension. The patient no longer takes Norvasc.  6. Arthritis. The patient reportedly has been prescribed prednisone 20 mg daily for arthritis. Medicine discharge informs me to stop it. He was given Percocet for pain on the medical floor. We'll continue as needed Percocet.  7. Substance abuse treatment. The patient declines residential treatment. He wants to follow up with AA meetings and SA IOP program at [facility].  8. Disposition. He will be discharged to home. He will follow up with Dr. Curly Shores, his primary psychiatrist for medication management and [facility] for substance abuse treatment.   I certify that inpatient services furnished can reasonably be expected to improve the patient's condition.   Jolanta Pucilowska 11/19/2015, 10:27 AM

## 2015-11-19 NOTE — H&P (Signed)
Psychiatric Admission Assessment Adult  Patient Identification: Curtis Cobb MRN:  [id number] Date of Evaluation:  11/19/2015 Chief Complaint:  Major Depression Principal Diagnosis: MDD (major depressive disorder), recurrent episode, severe (HCC) Diagnosis:   Patient Active Problem List   Diagnosis Date Noted  . MDD (major depressive disorder), recurrent episode, severe (HCC) [F33.2] 11/17/2015  . Alcohol withdrawal (HCC) [F10.239] 11/13/2015  . Opioid use disorder, mild, in sustained remission [F11.90] 10/08/2015  . Social phobia [F40.10] 10/08/2015  . 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 Alcohol use disorder, severe, dependence (HCC) [F10.20] 10/07/2015  . Cannabis use disorder, severe, dependence (HCC) [F12.20] 10/07/2015  . 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 HTN (hypertension) [I10] 10/07/2015  . Rheumatoid arthritis (HCC) [M06.9] 10/07/2015   History of Present Illness:  Identifying data. Curtis Cobb is a 44 year old male with a history of depression, anxiety, and substance use.  Chief complaint. "I stopped taking my medication."  History of present illness. Information was obtained from the patient and the chart. The patient has a long history of depression, anxiety, and mood instability. He was hospitalized at [facility] month ago. Following discharge patient was able to maintain sobriety for couple of weeks when stayed on his medications. He ran out of Elavil and became increasingly depressed with poor sleep and decreased appetite, anhedonia, feeling of guilt and hopelessness worthlessness, poor energy and concentration, social isolation, crying spells and passing suicidal thoughts. He could not sleep at all so started drinking himself to sleep. He is drinking escalated to the point that he was consuming a gallon of liquor daily. He lost his job in the meantime and no longer could afford regular  alcohol and started drinking rubbing alcohol. He tried to stop but was unable to do so because of symptoms of withdrawal. He ended up in the hospital with symptoms of delirium. He was admitted to medical floor and then transferred to psychiatry for treatment of depression and suicidal ideation. The patient endorses high anxiety with panic attacks that were treated on the medical floor with the when necessary Ativan. The patient denies psychotic symptoms. He denies symptoms of bipolar mania. He denies other than alcohol substance use.  Past psychiatric history. He was hospitalized probably 5 times before for depression, suicide attempts by overdose, alcoholism. He was twice in long-term rehabilitation and was able to maintain sobriety for a year. He has been tried on multiple medications in Zoloft, Paxil, Wellbutrin, trazodone, Tegretol, Wellbutrin, Neurontin. He does well on antipsychotic and antidepressants and was started on Elavil during his last hospitalization.  Family psychiatric history. Nonreported.  Social history. He now lives in [place],. 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 He family is out of state and he has very little support except for a friend here. He is done well going to [employer] Prior to admission he already contacted his AA sponsor and intends to return to treatment there. He has no health insurance.  Total Time spent with patient: 1 hour  Past Psychiatric History: Depression and anxiety and alcoholism.  Risk to Self: Is patient at risk for suicide?: No Risk to Others:   Prior Inpatient Therapy:   Prior Outpatient Therapy:    Alcohol Screening: 1. How often do you have a drink containing alcohol?: 4 or more times a week 2. How many drinks containing alcohol do you have on a typical day when you are drinking?: 10 or more 3. How  often do you have six or more drinks on one occasion?: Daily or almost daily Preliminary Score: 8 4.  How often during the last year have you found that you were not able to stop drinking once you had started?: Daily or almost daily 5. How often during the last year have you failed to do what was normally expected from you becasue of drinking?: Weekly 6. How often during the last year have you needed a first drink in the morning to get yourself going after a heavy drinking session?: Weekly 7. How often during the last year have you had a feeling of guilt of remorse after drinking?: Weekly 8. How often during the last year have you been unable to remember what happened the night before because you had been drinking?: Weekly 9. Have you or someone else been injured as a result of your drinking?: No 10. Has a relative or friend or a doctor or another health worker been concerned about your drinking or suggested you cut down?: No Alcohol Use Disorder Identification Test Final Score (AUDIT): 28 Brief Intervention: Yes Substance Abuse History in the last 12 months:  Yes.   Consequences of Substance Abuse: Negative Previous Psychotropic Medications: Yes  Psychological Evaluations: No  Past Medical History:  Past Medical History  Diagnosis Date  . Bipolar 1 disorder (HCC)   . Schizophrenia (HCC)   . Depression   . Anxiety   . Rheumatoid arthritis (HCC)   . Hypertension   . Schizoaffective disorder, depressive type (HCC) 10/06/2015  . Alcohol use disorder, moderate, dependence (HCC) 02/13/2014   History reviewed. No pertinent past surgical history. Family History: History reviewed. No pertinent family history. Family Psychiatric  History: None reported. Social History:  History  Alcohol Use  . Yes    Comment: gallon/day     History  Drug Use  . Yes  . Special: Amphetamines    Comment: THC    Social History   Social History  . Marital Status: Single    Spouse Name: N/A  . Number of Children: N/A  . Years of Education: N/A   Social History Main Topics  . Smoking status: Former  Smoker -- 0.00 packs/day for 0 years  . Smokeless tobacco: None  . Alcohol Use: Yes     Comment: gallon/day  . Drug Use: Yes    Special: Amphetamines     Comment: THC  . Sexual Activity: Yes    Birth Control/ Protection: None   Other Topics Concern  . None   Social History Narrative   Additional Social History:                         Allergies:   Allergies  Allergen Reactions  . Sulfa Antibiotics Other (See Comments)    Unknown reaction   Lab Results:  Results for orders placed or performed during the hospital encounter of 11/18/15 (from the past 48 hour(s))  Rapid HIV screen (HIV 1/2 Ab+Ag)     Status: None   Collection Time: 11/18/15  5:30 PM  Result Value Ref Range   HIV-1 P24 Antigen - HIV24 NON REACTIVE NON REACTIVE   HIV 1/2 Antibodies NON REACTIVE NON REACTIVE   Interpretation (HIV Ag Ab)      A non reactive test result means that HIV 1 or HIV 2 antibodies and HIV 1 p24 antigen were not detected in the specimen.    Metabolic Disorder Labs:  Lab Results  Component Value Date  HGBA1C 5.2 11/13/2015   No results found for: PROLACTIN No results found for: CHOL, TRIG, HDL, CHOLHDL, VLDL, LDLCALC  Current Medications: Current Facility-Administered Medications  Medication Dose Route Frequency Provider Last Rate Last Dose  . alum & mag hydroxide-simeth (MAALOX/MYLANTA) 200-200-20 MG/5ML suspension 30 mL  30 mL Oral Q4H PRN Beau Fanny, MD      . amitriptyline (ELAVIL) tablet 100 mg  100 mg Oral QHS Estelita Iten B Aijah Lattner, MD      . ibuprofen (ADVIL,MOTRIN) tablet 800 mg  800 mg Oral TID PRN Shari Prows, MD   800 mg at 11/19/15 1031  . LORazepam (ATIVAN) tablet 1 mg  1 mg Oral Daily PRN Symia Herdt B Loise Esguerra, MD      . magnesium hydroxide (MILK OF MAGNESIA) suspension 30 mL  30 mL Oral Daily PRN Beau Fanny, MD      . oxyCODONE-acetaminophen (PERCOCET/ROXICET) 5-325 MG per tablet 1 tablet  1 tablet Oral Q8H PRN Varshini Arrants B Marshawn Ninneman, MD        PTA Medications: Prescriptions prior to admission  Medication Sig Dispense Refill Last Dose  . amitriptyline (ELAVIL) 100 MG tablet Take 1 tablet (100 mg total) by mouth at bedtime. 7 tablet 0   . gabapentin (NEURONTIN) 100 MG capsule Take 1 capsule (100 mg total) by mouth 3 (three) times daily. 21 capsule 0 11/12/2015  . LORazepam (ATIVAN) 1 MG tablet Take 1 tablet (1 mg total) by mouth daily at 6 PM. 10 tablet 0   . oxyCODONE-acetaminophen (PERCOCET/ROXICET) 5-325 MG tablet Take 1 tablet by mouth every 6 (six) hours as needed for moderate pain. 20 tablet 0   . zolpidem (AMBIEN) 5 MG tablet Take 1 tablet (5 mg total) by mouth at bedtime as needed for sleep. 20 tablet 0   . amLODipine (NORVASC) 2.5 MG tablet Take 1 tablet (2.5 mg total) by mouth daily. (Patient not taking: Reported on 11/13/2015) 7 tablet 0   . ibuprofen (ADVIL,MOTRIN) 800 MG tablet Take 1 tablet (800 mg total) by mouth every 8 (eight) hours as needed for moderate pain. 30 tablet 0   . Multiple Vitamin (MULTIVITAMIN WITH MINERALS) TABS tablet Take 1 tablet by mouth daily. (Patient not taking: Reported on 11/19/2015) 30 tablet 0 Not Taking at Unknown time    Musculoskeletal: Strength & Muscle Tone: within normal limits Gait & Station: normal Patient leans: N/A  Psychiatric Specialty Exam: Physical Exam  Nursing note and vitals reviewed.   Review of Systems  Musculoskeletal: Positive for joint pain.  Psychiatric/Behavioral: Positive for depression and substance abuse.  All other systems reviewed and are negative.   Blood pressure 118/87, pulse 106, temperature 97.8 F (36.6 C), temperature source Oral, resp. rate 20, height 6\' 2"  (1.88 m), weight 85.73 kg (189 lb), SpO2 99 %.Body mass index is 24.26 kg/(m^2).  See SRA.                                                  Sleep:  Number of Hours: 7.45     Treatment Plan Summary: Daily contact with patient to assess and evaluate symptoms and  progress in treatment and Medication management   Curtis Cobb is a 44 year old male with a history of depression, anxiety, alcoholism and suicidal ideation transferred from medical floor  where he was hospitalized for alcohol withdrawal delirium.   1.  Suicidal ideation. The patient still has passing thoughts of suicide. He is able to contract for safety in the hospital.  2. Alcohol withdrawal. The patient completed alcohol detox on the medical floor. Vital signs are stable except for slight tachycardia. We'll monitor for symptoms of withdrawal.  3. Depression. He was restarted on Elavil.  4.  Anxiety. The patient was given as needed Ativan on medical floor we'll continue as needed Ativan..  5. Hypertension. The patient no longer takes Norvasc.  6. Arthritis. The patient reportedly has been prescribed prednisone 20 mg daily for arthritis. Medicine discharge informs me to stop it. He was given Percocet for pain on the medical floor. We'll continue as needed Percocet.  7. Substance abuse treatment. The patient declines residential treatment. He wants to follow up with AA meetings and SA IOP program at [facility].  8. Disposition. He will be discharged to home. He will follow up with Dr. Curly Shores, his primary psychiatrist for medication management and [facility] for substance abuse treatment.     Observation Level/Precautions:  15 minute checks  Laboratory:  CBC Chemistry Profile UDS UA  Psychotherapy:    Medications:    Consultations:    Discharge Concerns:    Estimated LOS:  Other:     I certify that inpatient services furnished can reasonably be expected to improve the patient's condition.   Dezarai Prew 11/22/201610:46 AM

## 2015-11-19 NOTE — Tx Team (Signed)
Interdisciplinary Treatment Plan Update (Adult)  Date:  11/19/2015 Time Reviewed:  4:50 PM  Progress in Treatment: Attending groups: Yes. Participating in groups:  Yes. Taking medication as prescribed:  Yes. Tolerating medication:  Yes. Family/Significant othe contact made:  No, will contact:  Girlfriend  Patient understands diagnosis:  Yes. Discussing patient identified problems/goals with staff:  Yes. Medical problems stabilized or resolved:  Yes. Denies suicidal/homicidal ideation: Yes. Issues/concerns per patient self-inventory:  Yes. Other:  New problem(s) identified: Yes, Describe:  NA  Discharge Plan or Barriers: Pt plans to return home and follow up with outpatient.    Reason for Continuation of Hospitalization: Depression Medication stabilization Suicidal ideation Withdrawal symptoms  Comments:The patient has a long history of depression, anxiety, and mood instability. He was hospitalized at [facility] month ago. Following discharge patient was able to maintain sobriety for couple of weeks when stayed on his medications. He ran out of Elavil and became increasingly depressed with poor sleep and decreased appetite, anhedonia, feeling of guilt and hopelessness worthlessness, poor energy and concentration, social isolation, crying spells and passing suicidal thoughts. He could not sleep at all so started drinking himself to sleep. He is drinking escalated to the point that he was consuming a gallon of liquor daily. He lost his job in the meantime and no longer could afford regular alcohol and started drinking rubbing alcohol. He tried to stop but was unable to do so because of symptoms of withdrawal. He ended up in the hospital with symptoms of delirium. He was admitted to medical floor and then transferred to psychiatry for treatment of depression and suicidal ideation. The patient endorses high anxiety with panic attacks that were treated on the medical floor  with the when necessary Ativan. The patient denies psychotic symptoms. He denies symptoms of bipolar mania. He denies other than alcohol substance use.  Estimated length of stay: Pt will likely d/c tomorrow.   New goal(s): NA   Review of initial/current patient goals per problem list:   1.  Goal(s): Patient will participate in aftercare plan * Met:  * Target date: at discharge * As evidenced by: Patient will participate within aftercare plan AEB aftercare provider and housing plan at discharge being identified.   2.  Goal (s): Patient will exhibit decreased depressive symptoms and suicidal ideations. * Met:  *  Target date: at discharge * As evidenced by: Patient will utilize self rating of depression at 3 or below and demonstrate decreased signs of depression or be deemed stable for discharge by MD.   3.  Goal(s): Patient will demonstrate decreased signs and symptoms of anxiety. * Met:  * Target date: at discharge * As evidenced by: Patient will utilize self rating of anxiety at 3 or below and demonstrated decreased signs of anxiety, or be deemed stable for discharge by MD   4.  Goal(s): Patient will demonstrate decreased signs of withdrawal due to substance abuse * Met:  * Target date: at discharge * As evidenced by: Patient will produce a CIWA/COWS score of 0, have stable vitals signs, and no symptoms of withdrawal.   Attendees: Patient:  Curtis Cobb 11/22/20164:50 PM  Family:   11/22/20164:50 PM  Physician:  Dr. Bary Leriche   11/22/20164:50 PM  Nursing:   Seth Bake, RN  11/22/20164:50 PM  Case Manager:   11/22/20164:50 PM  Counselor:   11/22/20164:50 PM  Other:  Wray Kearns, [place]  11/22/20164:50 PM  Other:  Everitt Amber, [place]  11/22/20164:50 PM  Other:  11/22/20164:50 PM  Other:  11/22/20164:50 PM  Other:  11/22/20164:50 PM  Other:  11/22/20164:50 PM  Other:  11/22/20164:50 PM  Other:  11/22/20164:50 PM  Other:  11/22/20164:50 PM  Other:   11/22/20164:50 PM    Scribe for Treatment Team:   Wray Kearns, MSW, LCSWA  11/19/2015, 4:50 PM

## 2015-11-19 NOTE — Progress Notes (Signed)
D:  Per pt self inventory pt reports sleeping good last night, appetite good, complaining of some anxiety intermittently, flat/depressed affect, denies SI/HI/AVH, tends to isolate in room at times, needs encouragement to attend activities.  A:  Emotional support provided, Encouraged pt to continue with treatment plan and attend all group activities, q15 min checks maintained for safety.  R:  Pt is receptive but needs encouragement to attend groups, pleasant with staff and other patients on the unit.

## 2015-11-19 NOTE — Plan of Care (Signed)
Problem: Alteration in mood Goal: LTG-Patient reports reduction in suicidal thoughts (Patient reports reduction in suicidal thoughts and is able to verbalize a safety plan for whenever patient is feeling suicidal)  Outcome: Progressing Pt denies SI, contracts for safety  Problem: Ineffective individual coping Goal: LTG: Patient will report a decrease in negative feelings Outcome: Progressing Pt states that his mood is "decent" today, flat/depressed affect.

## 2015-11-19 NOTE — Plan of Care (Signed)
Problem: Alteration in mood Goal: LTG-Pt's behavior demonstrates decreased signs of depression (Patient's behavior demonstrates decreased signs of depression to the point the patient is safe to return home and continue treatment in an outpatient setting)  Outcome: Progressing Pt has been ambulating in hallways, still not attending groups, encouraged pt to attend groups and activities, denies SI.

## 2015-11-19 NOTE — Discharge Summary (Addendum)
Physician Discharge Summary Note  Patient:  Curtis Cobb is an 44 y.o., male MRN:  [id number] DOB:  01/21/1971 Patient phone:  [phone] (home)  Patient address:   [address] [place] [address],  Total Time spent with patient: 30 minutes  Date of Admission:  11/18/2015 Date of Discharge: 11/20/2015  Reason for Admission:  Suicidal ideation.  Identifying data. Curtis Cobb is a 44 year old male with a history of depression, anxiety, and substance use.  Chief complaint. "I stopped taking my medication."  History of present illness. Information was obtained from the patient and the chart. The patient has a long history of depression, anxiety, and mood instability. He was hospitalized at [facility] month ago. Following discharge patient was able to maintain sobriety for couple of weeks when stayed on his medications. He ran out of Elavil and became increasingly depressed with poor sleep and decreased appetite, anhedonia, feeling of guilt and hopelessness worthlessness, poor energy and concentration, social isolation, crying spells and passing suicidal thoughts. He could not sleep at all so started drinking himself to sleep. He is drinking escalated to the point that he was consuming a gallon of liquor daily. He lost his job in the meantime and no longer could afford regular alcohol and started drinking rubbing alcohol. He tried to stop but was unable to do so because of symptoms of withdrawal. He ended up in the hospital with symptoms of delirium. He was admitted to medical floor and then transferred to psychiatry for treatment of depression and suicidal ideation. The patient endorses high anxiety with panic attacks that were treated on the medical floor with the when necessary Ativan. The patient denies psychotic symptoms. He denies symptoms of bipolar mania. He denies other than alcohol substance use.  Past psychiatric history. He was hospitalized probably 5 times  before for depression, suicide attempts by overdose, alcoholism. He was twice in long-term rehabilitation and was able to maintain sobriety for a year. He has been tried on multiple medications in Zoloft, Paxil, Wellbutrin, trazodone, Tegretol, Wellbutrin, Neurontin. He does well on antipsychotic and antidepressants and was started on Elavil during his last hospitalization.  Family psychiatric history. Nonreported.  Social history. He now lives in [place],. 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 He family is out of state and he has very little support except for a friend here. He is done well going to [employer] Prior to admission he already contacted his AA sponsor and intends to return to treatment there. He has no health insurance.  Principal Problem: MDD (major depressive disorder), recurrent episode, severe [facility]) Discharge Diagnoses: Patient Active Problem List   Diagnosis Date Noted  . MDD (major depressive disorder), recurrent episode, severe (HCC) [F33.2] 11/17/2015  . Alcohol withdrawal (HCC) [F10.239] 11/13/2015  . Opioid use disorder, mild, in sustained remission [F11.90] 10/08/2015  . Social phobia [F40.10] 10/08/2015  . 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 Alcohol use disorder, severe, dependence (HCC) [F10.20] 10/07/2015  . Cannabis use disorder, severe, dependence (HCC) [F12.20] 10/07/2015  . 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 HTN (hypertension) [I10] 10/07/2015  . Rheumatoid arthritis (HCC) [M06.9] 10/07/2015    Past Psychiatric History: depression, anxiety, alcoholism.  Past Medical History:  Past Medical History  Diagnosis Date  . Bipolar 1 disorder (HCC)   . Schizophrenia (HCC)   . Depression   . Anxiety   . Rheumatoid arthritis (HCC)   . Hypertension   .  Schizoaffective disorder, depressive type (HCC) 10/06/2015  . Alcohol use disorder, moderate, dependence  (HCC) 02/13/2014   History reviewed. No pertinent past surgical history. Family History: History reviewed. No pertinent family history. Family Psychiatric  History: none reported. Social History:  History  Alcohol Use  . Yes    Comment: gallon/day     History  Drug Use  . Yes  . Special: Amphetamines    Comment: THC    Social History   Social History  . Marital Status: Single    Spouse Name: N/A  . Number of Children: N/A  . Years of Education: N/A   Social History Main Topics  . Smoking status: Former Smoker -- 0.00 packs/day for 0 years  . Smokeless tobacco: None  . Alcohol Use: Yes     Comment: gallon/day  . Drug Use: Yes    Special: Amphetamines     Comment: THC  . Sexual Activity: Yes    Birth Control/ Protection: None   Other Topics Concern  . None   Social History Narrative    Hospital Course:    Curtis Cobb is a 44 year old male with a history of depression, anxiety, alcoholism and suicidal ideation transferred from medical floor where he was hospitalized for alcohol withdrawal delirium.   1. Suicidal ideation. This has resolved. The patient is able to contract for safety.  2. Alcohol withdrawal. The patient completed alcohol detox on the medical floor. Vital signs were stable except for slight tachycardia.  3. Depression. He was restarted on Elavil.  4. Anxiety. The patient was given as needed Ativan on medical floor we continued as needed Ativan but no prescriptions were given.  5. Hypertension. The patient no longer takes Norvasc.  6. Arthritis. The patient reportedly has been prescribed prednisone 20 mg daily for arthritis. Medicine discharge informs me to stop it. He was given Percocet for pain on the medical floor. We continued as needed Percocet but no prescriptions were given.  7. Substance abuse treatment. The patient declines residential treatment. He wants to follow up with AA meetings and SA IOP program at [facility].  8. Disposition. He was  discharged to home. He will follow up with Dr. Curly Shores, his primary psychiatrist for medication management and [facility] for substance abuse treatment.   Physical Findings: AIMS: Facial and Oral Movements Muscles of Facial Expression: None, normal Lips and Perioral Area: None, normal Jaw: None, normal Tongue: None, normal,Extremity Movements Upper (arms, wrists, hands, fingers): None, normal Lower (legs, knees, ankles, toes): None, normal, Trunk Movements Neck, shoulders, hips: None, normal, Overall Severity Severity of abnormal movements (highest score from questions above): None, normal Incapacitation due to abnormal movements: None, normal Patient's awareness of abnormal movements (rate only patient's report): Aware, no distress, Dental Status Current problems with teeth and/or dentures?: No Does patient usually wear dentures?: No  CIWA:  CIWA-Ar Total: 2 COWS:     Musculoskeletal: Strength & Muscle Tone: within normal limits Gait & Station: normal Patient leans: N/A  Psychiatric Specialty Exam: Review of Systems  Musculoskeletal: Positive for joint pain.  All other systems reviewed and are negative.   Blood pressure 118/87, pulse 106, temperature 97.8 F (36.6 C), temperature source Oral, resp. rate 20, height 6\' 2"  (1.88 m), weight 85.73 kg (189 lb), SpO2 99 %.Body mass index is 24.26 kg/(m^2).   See SRA.  Sleep:  Number of Hours: 7.45   Have you used any form of tobacco in the last 30 days? (Cigarettes, Smokeless Tobacco, Cigars, and/or Pipes): No  Has this patient used any form of tobacco in the last 30 days? (Cigarettes, Smokeless Tobacco, Cigars, and/or Pipes) Yes, No  Metabolic Disorder Labs:  Lab Results  Component Value Date   HGBA1C 5.2 11/13/2015   No results found for: PROLACTIN No results found for: CHOL, TRIG, HDL, CHOLHDL, VLDL, LDLCALC  See Psychiatric Specialty Exam and Suicide Risk  Assessment completed by Attending Physician prior to discharge.  Discharge destination:  Home  Is patient on multiple antipsychotic therapies at discharge:  No   Has Patient had three or more failed trials of antipsychotic monotherapy by history:  No  Recommended Plan for Multiple Antipsychotic Therapies: NA  Discharge Instructions    Diet - low sodium heart healthy    Complete by:  As directed      Diet - low sodium heart healthy    Complete by:  As directed      Increase activity slowly    Complete by:  As directed      Increase activity slowly    Complete by:  As directed             Medication List    STOP taking these medications        amLODipine 2.5 MG tablet  Commonly known as:  NORVASC     gabapentin 100 MG capsule  Commonly known as:  NEURONTIN     LORazepam 1 MG tablet  Commonly known as:  ATIVAN     multivitamin with minerals Tabs tablet     zolpidem 5 MG tablet  Commonly known as:  AMBIEN      TAKE these medications      Indication   ibuprofen 800 MG tablet  Commonly known as:  ADVIL,MOTRIN  Take 1 tablet (800 mg total) by mouth every 8 (eight) hours as needed for moderate pain.      oxyCODONE-acetaminophen 5-325 MG tablet  Commonly known as:  PERCOCET/ROXICET  Take 1 tablet by mouth every 6 (six) hours as needed for moderate pain.       ASK your doctor about these medications      Indication   amitriptyline 50 MG tablet  Commonly known as:  ELAVIL  Take 100 mg by mouth at bedtime.  Ask about: Which instructions should I use?      amitriptyline 100 MG tablet  Commonly known as:  ELAVIL  Take 1 tablet (100 mg total) by mouth at bedtime.  Ask about: Which instructions should I use?   Indication:  Depression          Follow-up recommendations:  Activity:  as tolerated. Diet:  low sodium heart healthy. Other:  keep follow up appointment.  Comments:    Signed: Jolanta Pucilowska 11/19/2015, 9:07 PM

## 2015-11-19 NOTE — [facility] Group Notes (Signed)
[facility] Group Notes:  (Nursing/MHT/Case Management/Adjunct)  Date:  11/19/2015  Time:  2:32 PM  Type of Therapy:  Psychoeducational Skills  Participation Level:  Did Not Attend  Isabelle Course 11/19/2015, 2:32 PM

## 2015-11-19 NOTE — Progress Notes (Signed)
Recreation Therapy Notes  Date: 11.22.16 Time: 3:00 pm Location: Craft room  Group Topic: Goal Setting  Goal Area(s) Addresses:  Patient will write at least one goal. Patient will write at least one obstacle.  Behavioral Response: Attentive  Intervention: Recovery Goal Chart  Activity: Patients were instructed to make a recovery goal chart including goals, obstacles, the date they started working on their goals, and the date they achieved their goals.  Education:LRT educated patients on healthy ways they can celebrate reaching their goals.  Education Outcome: In group clarification offered  Clinical Observations/Feedback: Patient completed activity by writing 3 goals and obstacles. Patient did not contribute to group discussion.  Jacquelynn Cree, LRT/CTRS 11/19/2015 4:40 PM

## 2015-11-19 NOTE — [facility] Group Notes (Signed)
[facility] Group Notes:  (Nursing/MHT/Case Management/Adjunct)  Date:  11/19/2015  Time:  3:43 AM  Type of Therapy:  Group Therapy  Participation Level:  Active  Participation Quality:  Appropriate  Affect:  Appropriate  Cognitive:  Appropriate  Insight:  Appropriate  Engagement in Group:  Engaged  Modes of Intervention:  n/a  Summary of Progress/Problems:  Curtis Cobb 11/19/2015, 3:43 AM

## 2015-11-20 DIAGNOSIS — F332 Major depressive disorder, recurrent severe without psychotic features: Secondary | ICD-10-CM | POA: Insufficient documentation

## 2015-11-20 LAB — CULTURE, BLOOD (ROUTINE X 2): Culture: NO GROWTH

## 2015-11-20 NOTE — Plan of Care (Signed)
Problem: Alteration in mood & ability to function due to Goal: STG-Patient will attend groups Outcome: Not Applicable Date Met:  30/07/62 Calm and cooperative. Med compliant. No voiced thoughts of hurting himself. No injuries noted. q 15 min checks maintained for safety.

## 2015-11-20 NOTE — Progress Notes (Signed)
  [facility] Adult Case Management Discharge Plan :  Will you be returning to the same living situation after discharge:  Yes,  home to apartment At discharge, do you have transportation home?: Yes,  a friend will pick up Do you have the ability to pay for your medications: No. Patient referred to Medication Management clinic [phone]  Release of information consent forms completed and in the chart;  Patient's signature needed at discharge.  Patient to Follow up at: Follow-up Information    Follow up with [facility]. Go on 11/25/2015.   Why:  For follow-up care appt Monday 11/25/15 at 8:00am   Contact information:   [address] [place], [place] Ph [phone] Fax [phone]      Next level of care provider has access to [facility] Link:no  Patient denies SI/HI: Yes,  patient denies SI/HI    Safety Planning and Suicide Prevention discussed: Yes,  SPE discussed with patient but would not provide consent for family   Have you used any form of tobacco in the last 30 days? (Cigarettes, Smokeless Tobacco, Cigars, and/or Pipes): No  Has patient been referred to the Quitline?: N/A patient is not a smoker  Beryl Meager T, MSW, LCSWA 11/20/2015, 12:02 PM

## 2015-11-20 NOTE — BHH Counselor (Signed)
Adult Comprehensive Assessment  Patient ID: Curtis Cobb, male   DOB: Mar 29, 1971, 44 y.o.   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 / Lack of resources (include bankruptcy): [employer] / Lack of housing: homeless Substance abuse: alcohol abuse after being sober for 49mos  Living/Environment/Situation:  Living Arrangements: Non-relatives/Friends Living conditions (as described by patient or guardian): lost housing when I quit job as my Press photographer is my landlord What is atmosphere in current home: Comfortable  Family History:  Marital status: Single  Childhood History:     Education:  Highest grade of school patient has completed: NA Currently a student?: No Name of school: NA Learning disability?: No  Employment/Work Situation:   Employment situation: Unemployed Patient's job has been impacted by current illness: Yes Describe how patient's job has been impacted: patient quit taking over counter antidepressant and relapsed losing job and housing Has patient ever served in combat?: No Are There Guns or Other Weapons in Your Home?: No  Financial Resources:   Financial resources: Income from employment Does patient have a representative payee or guardian?: No  Alcohol/Substance Abuse:   What has been your use of drugs/alcohol within the last 12 months?: alcohol abuse Alcohol/Substance Abuse Treatment Hx: Past Tx, Inpatient, Past Tx, Outpatient If yes, describe treatment: [facility]  Social Support System:   Conservation officer, nature Support System: Fair Museum/gallery exhibitions officer System: [facility], friends and family  Leisure/Recreation:      Strengths/Needs:      Discharge Plan:   Does patient have access to transportation?: Yes Will patient be returning to same living situation after discharge?: Yes Currently receiving community mental health services: Yes (From Whom) ([facility]) Does  patient have financial barriers related to discharge medications?: Yes Patient description of barriers related to discharge medications: Med Manamagent application offered  Summary/Recommendations:  Patient is single 44 yo wm admitted after aggressive behavior and alcohol abuse. Patient was recently at [facility] and has since found a job but ran out of medications and reports he started drinking 1 gallon liquor daily. Patient was detoxed and now in [facility]. Patient does not want [facility] but reports he will follow up at [facility] [phone] for SAOP and will discharge home and has a support network. Patient shared that his prescribed medication change will help him with stability and will be working less hours to reduce stress. Patient will be referred to Medication Management clinic for pharmacy. Patient is encouraged to participate in medication management, group therapy, and therapeutic milieu.    Lulu Riding., MSW, Theresia Majors  11/20/2015

## 2015-11-20 NOTE — [facility] Group Notes (Signed)
[facility] Group Notes:  (Nursing/MHT/Case Management/Adjunct)  Date:  11/20/2015  Time:  3:43 PM  Type of Therapy:  Psychoeducational Skills  Participation Level:  Active  Participation Quality:  Appropriate and Supportive  Affect:  Appropriate  Cognitive:  Appropriate  Insight:  Appropriate  Engagement in Group:  Engaged  Modes of Intervention:  Discussion and Socialization  Summary of Progress/Problems:  Marquette Old 11/20/2015, 3:43 PM

## 2015-11-20 NOTE — BHH Suicide Risk Assessment (Signed)
[facility] Discharge Suicide Risk Assessment   Demographic Factors:  Caucasian, Living alone and Unemployed  Total Time spent with patient: 30 minutes  Musculoskeletal: Strength & Muscle Tone: within normal limits Gait & Station: normal Patient leans: N/A  Psychiatric Specialty Exam: Physical Exam  Nursing note and vitals reviewed.   Review of Systems  Musculoskeletal: Positive for joint pain.  All other systems reviewed and are negative.   Blood pressure 126/75, pulse 111, temperature 98.1 F (36.7 C), temperature source Oral, resp. rate 20, height 6\' 2"  (1.88 m), weight 85.73 kg (189 lb), SpO2 99 %.Body mass index is 24.26 kg/(m^2).  General Appearance: Casual  Eye Contact::  Good  Speech:  Clear and Coherent and Normal Rate409  Volume:  Normal  Mood:  Euthymic  Affect:  Appropriate  Thought Process:  Goal Directed  Orientation:  Full (Time, Place, and Person)  Thought Content:  WDL  Suicidal Thoughts:  No  Homicidal Thoughts:  No  Memory:  Immediate;   Fair Recent;   Fair Remote;   Fair  Judgement:  Impaired  Insight:  Shallow  Psychomotor Activity:  Normal  Concentration:  Fair  Recall:  [id number] of Knowledge:Fair  Language: Fair  Akathisia:  No  Handed:  Right  AIMS (if indicated):     Assets:  Communication Skills Desire for Improvement Housing Intimacy Physical Health Resilience Social Support  Sleep:  Number of Hours: 4.15  Cognition: WNL  ADL's:  Intact   Have you used any form of tobacco in the last 30 days? (Cigarettes, Smokeless Tobacco, Cigars, and/or Pipes): No  Has this patient used any form of tobacco in the last 30 days? (Cigarettes, Smokeless Tobacco, Cigars, and/or Pipes) No  Mental Status Per Nursing Assessment::   On Admission:     Current Mental Status by Physician: NA  Loss Factors: Financial problems/change in socioeconomic status  Historical Factors: Impulsivity  Risk Reduction Factors:   Sense of responsibility to family  and Positive social support  Continued Clinical Symptoms:  Depression:   Comorbid alcohol abuse/dependence Impulsivity Severe Alcohol/Substance Abuse/Dependencies  Cognitive Features That Contribute To Risk:  None    Suicide Risk:  Minimal: No identifiable suicidal ideation.  Patients presenting with no risk factors but with morbid ruminations; may be classified as minimal risk based on the severity of the depressive symptoms  Principal Problem: MDD (major depressive disorder), recurrent episode, severe [facility]) Discharge Diagnoses:  Patient Active Problem List   Diagnosis Date Noted  . MDD (major depressive disorder), recurrent episode, severe (HCC) [F33.2] 11/17/2015  . Alcohol withdrawal (HCC) [F10.239] 11/13/2015  . Opioid use disorder, mild, in sustained remission [F11.90] 10/08/2015  . Social phobia [F40.10] 10/08/2015  . 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 Alcohol use disorder, severe, dependence (HCC) [F10.20] 10/07/2015  . Cannabis use disorder, severe, dependence (HCC) [F12.20] 10/07/2015  . 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 HTN (hypertension) [I10] 10/07/2015  . Rheumatoid arthritis (HCC) [M06.9] 10/07/2015      Plan Of Care/Follow-up recommendations:  Activity:  As tolerated. Diet:  Heart healthy. Other:  Keep follow-up appointments.  Is patient on multiple antipsychotic therapies at discharge:  No   Has Patient had three or more failed trials of antipsychotic monotherapy by history:  No  Recommended Plan for Multiple Antipsychotic Therapies: NA    Curtis Cobb 11/20/2015, 9:40 AM

## 2015-11-20 NOTE — [facility] Suicide Risk Assessment (Signed)
[facility] INPATIENT:  Family/Significant Other Suicide Prevention Education  Suicide Prevention Education:  Patient Refusal for Family/Significant Other Suicide Prevention Education: The patient Curtis Cobb has refused to provide written consent for family/significant other to be provided Family/Significant Other Suicide Prevention Education during admission and/or prior to discharge.  Physician notified.  Lulu Riding, MSW, LCSWA 11/20/2015, 10:53 AM

## 2015-11-20 NOTE — Progress Notes (Signed)
Pleasant and cooperative.  Denies depression and SI Discharge instructions given.  Verbalized understanding.  Prescriptions  Given and personal belongings returned.  Escorted off unit by this Clinical research associate to meet act team member to travel home.

## 2015-11-20 NOTE — Progress Notes (Signed)
Calm and cooperative. C/o b/l hip pain. Percocet 1 tab given as ordered PRN at 2155. Med compliant. Denies SI/HIAV/H. No behavior problems noted. Will continue to monitor for safety and behavior. Slept 7.15 hours

## 2015-12-26 ENCOUNTER — Ambulatory Visit: Payer: Self-pay

## 2016-02-12 ENCOUNTER — Ambulatory Visit: Payer: Self-pay

## 2016-03-25 ENCOUNTER — Inpatient Hospital Stay
Admission: EM | Admit: 2016-03-25 | Discharge: 2016-03-30 | DRG: 918 | Payer: Self-pay | Attending: Internal Medicine | Admitting: Internal Medicine

## 2016-03-25 ENCOUNTER — Emergency Department: Payer: Self-pay

## 2016-03-25 ENCOUNTER — Encounter: Payer: Self-pay | Admitting: Emergency Medicine

## 2016-03-25 DIAGNOSIS — T426X2A Poisoning by other antiepileptic and sedative-hypnotic drugs, intentional self-harm, initial encounter: Principal | ICD-10-CM | POA: Diagnosis present

## 2016-03-25 DIAGNOSIS — G4700 Insomnia, unspecified: Secondary | ICD-10-CM | POA: Diagnosis present

## 2016-03-25 DIAGNOSIS — M069 Rheumatoid arthritis, unspecified: Secondary | ICD-10-CM | POA: Diagnosis present

## 2016-03-25 DIAGNOSIS — T50912A Poisoning by multiple unspecified drugs, medicaments and biological substances, intentional self-harm, initial encounter: Secondary | ICD-10-CM

## 2016-03-25 DIAGNOSIS — F332 Major depressive disorder, recurrent severe without psychotic features: Secondary | ICD-10-CM | POA: Diagnosis present

## 2016-03-25 DIAGNOSIS — F29 Unspecified psychosis not due to a substance or known physiological condition: Secondary | ICD-10-CM

## 2016-03-25 DIAGNOSIS — F209 Schizophrenia, unspecified: Secondary | ICD-10-CM

## 2016-03-25 DIAGNOSIS — D72829 Elevated white blood cell count, unspecified: Secondary | ICD-10-CM

## 2016-03-25 DIAGNOSIS — Z882 Allergy status to sulfonamides status: Secondary | ICD-10-CM

## 2016-03-25 DIAGNOSIS — R1084 Generalized abdominal pain: Secondary | ICD-10-CM | POA: Diagnosis present

## 2016-03-25 DIAGNOSIS — Z79899 Other long term (current) drug therapy: Secondary | ICD-10-CM

## 2016-03-25 DIAGNOSIS — T43622A Poisoning by amphetamines, intentional self-harm, initial encounter: Secondary | ICD-10-CM | POA: Diagnosis present

## 2016-03-25 DIAGNOSIS — E876 Hypokalemia: Secondary | ICD-10-CM

## 2016-03-25 DIAGNOSIS — I10 Essential (primary) hypertension: Secondary | ICD-10-CM | POA: Diagnosis present

## 2016-03-25 DIAGNOSIS — F1010 Alcohol abuse, uncomplicated: Secondary | ICD-10-CM | POA: Diagnosis present

## 2016-03-25 DIAGNOSIS — T50901A Poisoning by unspecified drugs, medicaments and biological substances, accidental (unintentional), initial encounter: Secondary | ICD-10-CM | POA: Diagnosis present

## 2016-03-25 DIAGNOSIS — R21 Rash and other nonspecific skin eruption: Secondary | ICD-10-CM | POA: Diagnosis present

## 2016-03-25 DIAGNOSIS — R109 Unspecified abdominal pain: Secondary | ICD-10-CM

## 2016-03-25 DIAGNOSIS — R000 Tachycardia, unspecified: Secondary | ICD-10-CM | POA: Diagnosis present

## 2016-03-25 DIAGNOSIS — Z818 Family history of other mental and behavioral disorders: Secondary | ICD-10-CM

## 2016-03-25 LAB — CBC
HEMATOCRIT: 42.2 % (ref 40.0–52.0)
Hemoglobin: 14.2 g/dL (ref 13.0–18.0)
MCH: 29.3 pg (ref 26.0–34.0)
MCHC: 33.7 g/dL (ref 32.0–36.0)
MCV: 86.8 fL (ref 80.0–100.0)
Platelets: 333 10*3/uL (ref 150–440)
RBC: 4.86 MIL/uL (ref 4.40–5.90)
RDW: 13.3 % (ref 11.5–14.5)
WBC: 16.9 10*3/uL — ABNORMAL HIGH (ref 3.8–10.6)

## 2016-03-25 LAB — URINE DRUG SCREEN, QUALITATIVE (ARMC ONLY)
Amphetamines, Ur Screen: POSITIVE — AB
BARBITURATES, UR SCREEN: POSITIVE — AB
BENZODIAZEPINE, UR SCRN: NOT DETECTED
Cannabinoid 50 Ng, Ur ~~LOC~~: NOT DETECTED
Cocaine Metabolite,Ur ~~LOC~~: NOT DETECTED
MDMA (Ecstasy)Ur Screen: NOT DETECTED
METHADONE SCREEN, URINE: NOT DETECTED
OPIATE, UR SCREEN: NOT DETECTED
Phencyclidine (PCP) Ur S: NOT DETECTED
Tricyclic, Ur Screen: POSITIVE — AB

## 2016-03-25 LAB — COMPREHENSIVE METABOLIC PANEL
ALK PHOS: 57 U/L (ref 38–126)
ALT: 75 U/L — AB (ref 17–63)
AST: 59 U/L — AB (ref 15–41)
Albumin: 4.7 g/dL (ref 3.5–5.0)
Anion gap: 11 (ref 5–15)
BILIRUBIN TOTAL: 0.9 mg/dL (ref 0.3–1.2)
BUN: 17 mg/dL (ref 6–20)
CHLORIDE: 100 mmol/L — AB (ref 101–111)
CO2: 22 mmol/L (ref 22–32)
CREATININE: 1.11 mg/dL (ref 0.61–1.24)
Calcium: 9.2 mg/dL (ref 8.9–10.3)
GFR calc Af Amer: >60 mL/min (ref >60)
GFR calc non Af Amer: >60 mL/min (ref >60)
Glucose, Bld: 129 mg/dL — ABNORMAL HIGH (ref 65–99)
Potassium: 3.2 mmol/L — ABNORMAL LOW (ref 3.5–5.1)
Sodium: 133 mmol/L — ABNORMAL LOW (ref 135–145)
TOTAL PROTEIN: 8.2 g/dL — AB (ref 6.5–8.1)

## 2016-03-25 LAB — ACETAMINOPHEN LEVEL: Acetaminophen (Tylenol), Serum: <10 ug/mL — ABNORMAL LOW (ref 10–30)

## 2016-03-25 LAB — ETHANOL

## 2016-03-25 LAB — MRSA PCR SCREENING: MRSA by PCR: NEGATIVE

## 2016-03-25 LAB — GLUCOSE, CAPILLARY: Glucose-Capillary: 112 mg/dL — ABNORMAL HIGH (ref 65–99)

## 2016-03-25 LAB — SALICYLATE LEVEL: Salicylate Lvl: <4 mg/dL (ref 2.8–30.0)

## 2016-03-25 MED ORDER — LORAZEPAM 2 MG/ML IJ SOLN
2.0000 mg | INTRAMUSCULAR | Status: DC | PRN
  Administered 2016-03-26 – 2016-03-28 (×4): 2 mg via INTRAVENOUS
  Filled 2016-03-25 (×4): qty 1

## 2016-03-25 MED ORDER — ENOXAPARIN SODIUM 40 MG/0.4ML ~~LOC~~ SOLN
40.0000 mg | SUBCUTANEOUS | Status: DC
  Administered 2016-03-26 – 2016-03-29 (×4): 40 mg via SUBCUTANEOUS
  Filled 2016-03-25 (×4): qty 0.4

## 2016-03-25 MED ORDER — SODIUM CHLORIDE 0.9 % IV SOLN
Freq: Once | INTRAVENOUS | Status: AC
  Administered 2016-03-25: 15:00:00 via INTRAVENOUS

## 2016-03-25 MED ORDER — POTASSIUM CHLORIDE 20 MEQ PO PACK: 20.0000 meq | PACK | Freq: Once | ORAL | Status: DC

## 2016-03-25 MED ORDER — LORAZEPAM 2 MG/ML IJ SOLN
2.0000 mg | Freq: Once | INTRAMUSCULAR | Status: AC
  Administered 2016-03-25: 2 mg via INTRAVENOUS
  Filled 2016-03-25: qty 1

## 2016-03-25 MED ORDER — ONDANSETRON HCL 4 MG/2ML IJ SOLN
4.0000 mg | Freq: Once | INTRAMUSCULAR | Status: AC
  Administered 2016-03-25: 4 mg via INTRAVENOUS

## 2016-03-25 MED ORDER — SODIUM CHLORIDE 0.9% FLUSH
3.0000 mL | Freq: Two times a day (BID) | INTRAVENOUS | Status: DC
  Administered 2016-03-25 – 2016-03-30 (×7): 3 mL via INTRAVENOUS

## 2016-03-25 MED ORDER — DEXMEDETOMIDINE HCL IN NACL 200 MCG/50ML IV SOLN: 0.4000 ug/kg/h | INTRAVENOUS | Status: DC

## 2016-03-25 MED ORDER — HYDROMORPHONE HCL 1 MG/ML IJ SOLN
INTRAMUSCULAR | Status: AC
  Administered 2016-03-25: 2 mg
  Filled 2016-03-25: qty 2

## 2016-03-25 MED ORDER — LORAZEPAM 2 MG/ML IJ SOLN
INTRAMUSCULAR | Status: AC
  Administered 2016-03-25: 1 mg via INTRAVENOUS
  Filled 2016-03-25: qty 1

## 2016-03-25 MED ORDER — MORPHINE SULFATE (PF) 2 MG/ML IV SOLN
2.0000 mg | INTRAVENOUS | Status: DC | PRN
  Administered 2016-03-27: 21:00:00 2 mg via INTRAVENOUS
  Administered 2016-03-28 (×2): 4 mg via INTRAVENOUS
  Filled 2016-03-25 (×2): qty 2
  Filled 2016-03-25: qty 1

## 2016-03-25 MED ORDER — DIAZEPAM 5 MG/ML IJ SOLN
5.0000 mg | Freq: Four times a day (QID) | INTRAMUSCULAR | Status: DC
Start: 2016-03-25 — End: 2016-03-26
  Administered 2016-03-25: 5 mg via INTRAVENOUS
  Filled 2016-03-25: qty 2

## 2016-03-25 MED ORDER — LORAZEPAM 2 MG/ML IJ SOLN
1.0000 mg | Freq: Once | INTRAMUSCULAR | Status: AC
  Administered 2016-03-25: 1 mg via INTRAVENOUS

## 2016-03-25 MED ORDER — HYDROMORPHONE HCL 1 MG/ML IJ SOLN
2.0000 mg | Freq: Once | INTRAMUSCULAR | Status: AC
Start: 2016-03-25 — End: 2016-03-25
  Administered 2016-03-25: 2 mg via INTRAVENOUS

## 2016-03-25 MED ORDER — ONDANSETRON HCL 4 MG/2ML IJ SOLN
INTRAMUSCULAR | Status: AC
  Administered 2016-03-25: 4 mg via INTRAVENOUS
  Filled 2016-03-25: qty 2

## 2016-03-25 MED ORDER — SODIUM CHLORIDE 0.9 % IV SOLN
INTRAVENOUS | Status: DC
Start: 2016-03-25 — End: 2016-03-28
  Administered 2016-03-25 – 2016-03-28 (×3): via INTRAVENOUS

## 2016-03-25 MED ORDER — HYDROMORPHONE HCL 1 MG/ML IJ SOLN
INTRAMUSCULAR | Status: AC
  Administered 2016-03-25: 1 mg via INTRAVENOUS
  Filled 2016-03-25: qty 1

## 2016-03-25 MED ORDER — HYDROMORPHONE HCL 1 MG/ML IJ SOLN
1.0000 mg | Freq: Once | INTRAMUSCULAR | Status: AC
  Administered 2016-03-25: 1 mg via INTRAVENOUS

## 2016-03-25 MED ORDER — DEXMEDETOMIDINE HCL IN NACL 400 MCG/100ML IV SOLN
0.0000 ug/kg/h | INTRAVENOUS | Status: DC
  Administered 2016-03-25 – 2016-03-26 (×3): 0.9 ug/kg/h via INTRAVENOUS
  Filled 2016-03-25 (×3): qty 100

## 2016-03-25 MED ORDER — POTASSIUM CHLORIDE CRYS ER 20 MEQ PO TBCR: 20.0000 meq | EXTENDED_RELEASE_TABLET | Freq: Once | ORAL | Status: DC

## 2016-03-25 NOTE — Progress Notes (Signed)
eLink Physician-Brief Progress Note Patient Name: Curtis Cobb DOB: 10-03-1971 MRN: [id number]   Date of Service  03/25/2016  HPI/Events of Note  Patient with hallucinations, severe agitation BP and HR elevated, possible benzo withdrawal  eICU Interventions  1.start precedex 2.will start Valium 5 mg every 6 hrs 3.ativan as needed        Rylynn Schoneman 03/25/2016, 11:29 PM

## 2016-03-25 NOTE — ED Notes (Signed)
Report given to Brett Canales, RN at this time who verbalized understanding. Pt and family made aware for need to change rooms to room 24 due to IVC. Pt and family made aware and verbalized understanding.

## 2016-03-25 NOTE — ED Notes (Addendum)
Pt reports he's withdrawing from Tianeptine (antidepressent with opiate like effects); girlfriend reports he used 16oz last night. Pt also has taken at least 70 20 mg adderall since Saturday. Girlfriend reports pt was paranoid all night, reports SI last night. Pt hyperactive in triage, skin cool and clammy, pt either with hypermovement or pt very sleepy. Pt possibly also took an unknown amount of gabapentin.

## 2016-03-25 NOTE — ED Provider Notes (Signed)
[facility] Emergency Department Provider Note   ____________________________________________  Time seen: Approximately 3:27 PM  I have reviewed the triage vital signs and the nursing notes.   HISTORY  Chief Complaint Withdrawal  HPI Curtis Cobb is a 45 y.o. male who has taken multiple medications over the past 2-3 days to try to harm himself secondary to severe psychosis and paranoid schizophrenia. Patient has taken multiple doses of amitriptyline, gabapentin, Adderall, and over-the-counter medication called tianeptine sodium.  Patient is extremely anxious and paranoid on arrival to the ER, and will not tell us why he took the medications. The family member states that she has been sitting with him all night, and even when they stopped to use the bathroom on the way here before in our waiting room, he took 12 more Adderall because she states that there were 12 and the bottle when they left the home. Patient will not give [place] any other significant history or complaints because he is very psychotic and paranoid.     Past Medical History  Diagnosis Date  . Bipolar 1 disorder (HCC)   . Schizophrenia (HCC)   . Depression   . Anxiety   . Rheumatoid arthritis (HCC)   . Hypertension   . Schizoaffective disorder, depressive type (HCC) 10/06/2015  . Alcohol use disorder, moderate, dependence (HCC) 02/13/2014    Patient Active Problem List   Diagnosis Date Noted  . Severe episode of recurrent major depressive disorder, without psychotic features (HCC)   . MDD (major depressive disorder), recurrent episode, severe (HCC) 11/17/2015  . Alcohol withdrawal (HCC) 11/13/2015  . Opioid use disorder, mild, in sustained remission 10/08/2015  . Social phobia 10/08/2015  . 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 Alcohol use disorder, severe, dependence (HCC) 10/07/2015  . Cannabis use disorder, severe, dependence (HCC) 10/07/2015  .  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 HTN (hypertension) 10/07/2015  . Rheumatoid arthritis (HCC) 10/07/2015    History reviewed. No pertinent past surgical history.  Current Outpatient Rx  Name  Route  Sig  Dispense  Refill  . amitriptyline (ELAVIL) 100 MG tablet   Oral   Take 1 tablet (100 mg total) by mouth at bedtime.   30 tablet   0   . ibuprofen (ADVIL,MOTRIN) 800 MG tablet   Oral   Take 1 tablet (800 mg total) by mouth every 8 (eight) hours as needed for moderate pain. Patient not taking: Reported on 11/19/2015   30 tablet   0   . oxyCODONE-acetaminophen (PERCOCET/ROXICET) 5-325 MG tablet   Oral   Take 1 tablet by mouth every 6 (six) hours as needed for moderate pain. Patient not taking: Reported on 11/19/2015   20 tablet   0     Allergies Sulfa antibiotics  No family history on file.  Social History Social History  Substance Use Topics  . Smoking status: Former Smoker -- 0.00 packs/day for 0 years  . Smokeless tobacco: None  . Alcohol Use: Yes     Comment: gallon/day    Review of Systems Constitutional: No fever/chills patient does not have any particular complaints because he really will not give his any other sniffing history and he is a very poor historian. The listed review of systems are primarily from the family member. Eyes: No visual changes. ENT: No sore throat. Cardiovascular: Denies chest pain. Respiratory: Denies shortness of breath. Gastrointestinal: No abdominal pain.  No nausea, no vomiting.  No diarrhea.  No constipation. Genitourinary: Negative  for dysuria. Musculoskeletal: Negative for back pain. Skin: Negative for rash. Neurological: Negative for headaches, focal weakness or numbness.   10-point ROS otherwise negative. Not reliable secondary to patient is psychotic in a very poor historian.  ____________________________________________   PHYSICAL EXAM:  VITAL SIGNS: ED Triage Vitals  Enc Vitals Group     BP  03/25/16 1424 135/94 mmHg     Pulse Rate 03/25/16 1424 119     Resp 03/25/16 1424 16     Temp 03/25/16 1424 98.5 F (36.9 C)     Temp Source 03/25/16 1424 Oral     SpO2 03/25/16 1424 96 %     Weight 03/25/16 1424 207 lb (93.895 kg)     Height --      Head Cir --      Peak Flow --      Pain Score --      Pain Loc --      Pain Edu? --      Excl. in GC? --      Constitutional: Alert and oriented. Well appearing but extremely anxious and somewhat psychotic. Patient's also mildly diaphoretic Eyes: Conjunctivae are normal. PERRL. EOMI. Head: Atraumatic. Nose: No congestion/rhinnorhea. Mouth/Throat: Mucous membranes are moist.  Oropharynx non-erythematous. Neck: No stridor.   Cardiovascular: Tachycardia, regular rhythm. Grossly normal heart sounds.  Good peripheral circulation. Respiratory: Normal respiratory effort.  No retractions. Lungs CTAB. Gastrointestinal: Soft and nontender. No distention. No abdominal bruits. No CVA tenderness. Musculoskeletal: No lower extremity tenderness nor edema.  No joint effusions. Neurologic:  Normal speech and language. No gross focal neurologic deficits are appreciated. No gait instability. Skin:  Skin is warm, dry and intact. No rash noted. Psychiatric: Mood and affect are anxious and paranoid.  ____________________________________________   LABS (all labs ordered are listed, but only abnormal results are displayed)  Labs Reviewed  COMPREHENSIVE METABOLIC PANEL - Abnormal; Notable for the following:    Sodium 133 (*)    Potassium 3.2 (*)    Chloride 100 (*)    Glucose, Bld 129 (*)    Total Protein 8.2 (*)    AST 59 (*)    ALT 75 (*)    All other components within normal limits  CBC - Abnormal; Notable for the following:    WBC 16.9 (*)    All other components within normal limits  URINE DRUG SCREEN, QUALITATIVE ([facility] ONLY) - Abnormal; Notable for the following:    Tricyclic, Ur Screen POSITIVE (*)    Amphetamines, Ur Screen POSITIVE  (*)    Barbiturates, Ur Screen POSITIVE (*)    All other components within normal limits  ACETAMINOPHEN LEVEL - Abnormal; Notable for the following:    Acetaminophen (Tylenol), Serum <10 (*)    All other components within normal limits  ETHANOL  SALICYLATE LEVEL   ____________________________________________  EKG ED ECG REPORT I, Leona Carry, the attending physician, personally viewed and interpreted this ECG.   Date: 03/25/2016  EKG Time: 1434  Rate 109  Rhythm: Sinus tachycardia, possible left atrial large.  Axis: Normal  Intervals:none  ST&T Change: Nonspecific ST and T-wave changes    ____________________________________________  RADIOLOGY Ct Renal Stone Study  03/25/2016  CLINICAL DATA:  Bilateral flank pain, right lower quadrant pain, right groin pain starting this afternoon EXAM: CT ABDOMEN AND PELVIS WITHOUT CONTRAST TECHNIQUE: Multidetector CT imaging of the abdomen and pelvis was performed following the standard protocol without IV contrast. COMPARISON:  None. FINDINGS: Lower chest:  Lung bases are  unremarkable.  Small hiatal hernia. Hepatobiliary: Unenhanced liver shows no biliary ductal dilatation. Mild distended gallbladder. No calcified gallstones. No intrahepatic biliary ductal dilatation. No CBD dilatation. Pancreas: Mild dilatation of main pancreatic duct up to 3.4 mm. No peripancreatic fluid. No definite mass is noted on this unenhanced scan. Spleen: Unenhanced spleen is unremarkable. Adrenals/Urinary Tract: No adrenal gland mass. Unenhanced kidneys are symmetrical in size. No nephrolithiasis. No hydronephrosis or hydroureter. No calcified ureteral calculi. A distended urinary bladder is noted. No calcified calculi are noted within urinary bladder. Stomach/Bowel: There is no small bowel obstruction. No gastric outlet obstruction. Abundant stool noted in cecum. Moderate stool noted in right colon. Moderate stool noted in proximal transverse colon. No pericecal  inflammation. Normal appendix is noted in axial image 64. The terminal ileum is unremarkable. No mesenteric fluid collection. The descending colon is empty. Sigmoid colon is empty collapsed. Few diverticula are noted sigmoid colon. No evidence of acute diverticulitis. Vascular/Lymphatic: No aortic aneurysm. No retroperitoneal or mesenteric adenopathy. Reproductive: Prostate gland and seminal vesicles are unremarkable. Other: Small left inguinal scrotal canal hernia containing fat without evidence of acute complication. There is no inguinal adenopathy. No ascites or free abdominal air. Tiny umbilical hernia containing fat without evidence of acute complication Musculoskeletal: No destructive bony lesions are noted. Sagittal images of the spine shows mild degenerative changes lower thoracic spine. Disc space flattening noted with vacuum disc phenomenon at L4-L5 level. IMPRESSION: 1. There is no nephrolithiasis.  No hydronephrosis or hydroureter. 2. No calcified ureteral calculi. 3. There is distended urinary bladder. No calcified calculi are noted within urinary bladder. 4. Abundant stool noted within cecum. No pericecal inflammation. Normal appendix. The terminal ileum is unremarkable. Moderate stool noted in right colon and proximal transverse colon. 5. Tiny umbilical hernia containing fat without evidence of acute complication. 6. No small bowel obstruction.  No ascites or free air. Electronically Signed   By: Natasha Mead M.D.   On: 03/25/2016 19:08    ____________________________________________   PROCEDURES  Procedure(s) performed: None  Critical Care performed: No  ____________________________________________   INITIAL IMPRESSION / ASSESSMENT AND PLAN / ED COURSE  Pertinent labs & imaging results that were available during my care of the patient were reviewed by me and considered in my medical decision making (see chart for details). 8:16 PM Patient was started on 2 L IV fluids and he was  placed on cardiac monitor. Patient was called and stated that we need to continued cardiac monitoring and treat him symptomatically. She also felt that we needed to repeat EKG in 2 hours to make sure there was no prolonged QT.  8:16 PM Patient will get repeat EKG at this time as well as he has been extremely anxious and we decided to give the patient some IV Ativan.  8:16 PM Patient had this extensive episode where he was complaining of severe left-sided abdominal pain especially after he urinated, and he was grabbing the rails of the bed, sweating in the room, screaming, and been uncontrollable concerning the pain. As far as his exam is concerned, his pain did not seem to fit his exam. Patient was given some IV Dilaudid and Zofran at that time just to calm him down even though he started being given a milligram Ativan IV and was here for an overdose. Patient went for CT abdomen pelvis to rule out kidney stone which was negative. Patient's urine drug screen was positive for multiple substances and he remained psychotic throughout his visit in the ED.  Patient will have to be observed medically at least overnight secondary to the significant amount of unknown drug that he ingested. They will need to consult psychiatry tomorrow. We went ahead and did an involuntary commitment papers on this patient in order to have a sitter and protect him from further harm to himself. Hospitalist was consulted for admission. ____________________________________________   FINAL CLINICAL IMPRESSION(S) / ED DIAGNOSES  Final diagnoses:  Left flank pain  Multiple drug overdose, intentional self-harm, initial encounter (HCC)  Psychosis, unspecified psychosis type  Schizophrenia, unspecified type [facility])     Leona Carry, MD 03/25/16 2016

## 2016-03-25 NOTE — ED Notes (Signed)
Patient resting, no acute distress noted.  Sleeping very heavily.

## 2016-03-25 NOTE — ED Notes (Signed)
Patient returned from CT

## 2016-03-25 NOTE — ED Notes (Signed)
Called lab to check on UDS, should result in 15 minutes or so.

## 2016-03-25 NOTE — ED Notes (Signed)
toileting offered,pt had urge to urinate,urinal given

## 2016-03-25 NOTE — H&P (Signed)
[facility] [place] [facility]   PATIENT NAME: Curtis Cobb    MR#:  [id number]  DATE OF BIRTH:  02/16/1971  DATE OF ADMISSION:  03/25/2016  PRIMARY CARE PHYSICIAN: No primary care provider on file.   REQUESTING/REFERRING PHYSICIAN: Bonita Quin  CHIEF COMPLAINT:   Multiple Drug OD  HISTORY OF PRESENT ILLNESS:  Curtis Cobb  is a 45 y.o. male with a known history of depression/bipolar disorder brought in following multiple drug overdose, namely 70 tablets of gabapentin, 30 tablets of Adderall and unknown number of amitriptyline with suicidal ideation. The history I got is from the ED physician's note and patient is under deep sedation following Ativan and Dilaudid administration, and no family member is available at this time.   Per ED physician's note, he was extremely agitated on arrival to the ED and vital signs were stable. Patient was not hypoxic. Lab work revealed normal BMP except for potassium 3.2, LFTs normal, WBC is 16.9. Urine drug screen positive for amitriptyline, barbiturates and tricyclics. Serum alcohol level less than 10, serum acetaminophen levels WNL. EKG sinus tachycardia with ventricular of 10 9 bpm, no QT prolongation, no acute ischemic changes. Poison Control Center was contacted by the ED physician who recommended admission for close monitoring with follow-up EKGs. Patient had 4 EKGs done while in the ED which were unremarkable. While in the ED patient complained of severe abdominal pain for which a CT of the abdomen and pelvis was obtained which was reported negative for acute abdominal pathology. Patient received Ativan and Dilaudid while in the ED following which his agitation resolved and is currently in deep sleep and under influence of sedation. Per nursing staff, patient did not have any problems since receiving Dilaudid.   At the current time patient is resting in bed comfortably, no distress noted, in deep sleep. His vital signs are stable and  O2 saturations are maintaining in the mid 90s on room air.  PAST MEDICAL HISTORY:   Past Medical History  Diagnosis Date  . Bipolar 1 disorder (HCC)   . Schizophrenia (HCC)   . Depression   . Anxiety   . Rheumatoid arthritis (HCC)   . Hypertension   . Schizoaffective disorder, depressive type (HCC) 10/06/2015  . Alcohol use disorder, moderate, dependence (HCC) 02/13/2014    PAST SURGICAL HISTORY:  History reviewed. No pertinent past surgical history.  SOCIAL HISTORY:   Social History  Substance Use Topics  . Smoking status: Former Smoker -- 0.00 packs/day for 0 years  . Smokeless tobacco: Not on file  . Alcohol Use: Yes     Comment: gallon/day    FAMILY HISTORY:   Family History  Problem Relation Age of Onset  . Family history unknown: Yes    DRUG ALLERGIES:   Allergies  Allergen Reactions  . Sulfa Antibiotics Other (See Comments)    Unknown reaction    REVIEW OF SYSTEMS:   Review of Systems  Unable to perform ROS: mental acuity    MEDICATIONS AT HOME:   Prior to Admission medications   Medication Sig Start Date End Date Taking? Authorizing Provider  amitriptyline (ELAVIL) 100 MG tablet Take 1 tablet (100 mg total) by mouth at bedtime. 11/19/15   Shari Prows, MD  ibuprofen (ADVIL,MOTRIN) 800 MG tablet Take 1 tablet (800 mg total) by mouth every 8 (eight) hours as needed for moderate pain. Patient not taking: Reported on 11/19/2015 11/17/15   Altamese Dilling, MD  oxyCODONE-acetaminophen (PERCOCET/ROXICET) 5-325 MG tablet Take 1  tablet by mouth every 6 (six) hours as needed for moderate pain. Patient not taking: Reported on 11/19/2015 11/17/15   Altamese Dilling, MD      VITAL SIGNS:  Blood pressure 105/67, pulse 88, temperature 98.5 F (36.9 C), temperature source Oral, resp. rate 33, weight 93.895 kg (207 lb), SpO2 93 %.  PHYSICAL EXAMINATION:  Physical Exam  Constitutional: He appears well-developed and well-nourished.  HENT:   Head: Normocephalic and atraumatic.  Nose: Nose normal.  Mouth/Throat: Oropharynx is clear and moist.  Eyes: Conjunctivae are normal. Pupils are equal, round, and reactive to light.  Neck: Normal range of motion. No thyromegaly present.  Cardiovascular: Normal rate, regular rhythm and normal heart sounds.   Respiratory: Effort normal and breath sounds normal. No respiratory distress. He has no wheezes. He has no rales. He exhibits no tenderness.  GI: Soft. Bowel sounds are normal. He exhibits no distension. There is no tenderness.  Musculoskeletal: Normal range of motion.  Neurological:  Patient deeply sedated, unable to assess at this time  Skin: Skin is warm and dry. No rash noted. No erythema.  Psychiatric:  Unable to assess since patient is under sedation   LABORATORY PANEL:   CBC  Recent Labs Lab 03/25/16 1436  WBC 16.9*  HGB 14.2  HCT 42.2  PLT 333   ------------------------------------------------------------------------------------------------------------------  Chemistries   Recent Labs Lab 03/25/16 1436  NA 133*  K 3.2*  CL 100*  CO2 22  GLUCOSE 129*  BUN 17  CREATININE 1.11  CALCIUM 9.2  AST 59*  ALT 75*  ALKPHOS 57  BILITOT 0.9   ------------------------------------------------------------------------------------------------------------------  Cardiac Enzymes No results for input(s): TROPONINI in the last 168 hours. ------------------------------------------------------------------------------------------------------------------  RADIOLOGY:  Ct Renal Stone Study  03/25/2016  CLINICAL DATA:  Bilateral flank pain, right lower quadrant pain, right groin pain starting this afternoon EXAM: CT ABDOMEN AND PELVIS WITHOUT CONTRAST TECHNIQUE: Multidetector CT imaging of the abdomen and pelvis was performed following the standard protocol without IV contrast. COMPARISON:  None. FINDINGS: Lower chest:  Lung bases are unremarkable.  Small hiatal hernia.  Hepatobiliary: Unenhanced liver shows no biliary ductal dilatation. Mild distended gallbladder. No calcified gallstones. No intrahepatic biliary ductal dilatation. No CBD dilatation. Pancreas: Mild dilatation of main pancreatic duct up to 3.4 mm. No peripancreatic fluid. No definite mass is noted on this unenhanced scan. Spleen: Unenhanced spleen is unremarkable. Adrenals/Urinary Tract: No adrenal gland mass. Unenhanced kidneys are symmetrical in size. No nephrolithiasis. No hydronephrosis or hydroureter. No calcified ureteral calculi. A distended urinary bladder is noted. No calcified calculi are noted within urinary bladder. Stomach/Bowel: There is no small bowel obstruction. No gastric outlet obstruction. Abundant stool noted in cecum. Moderate stool noted in right colon. Moderate stool noted in proximal transverse colon. No pericecal inflammation. Normal appendix is noted in axial image 64. The terminal ileum is unremarkable. No mesenteric fluid collection. The descending colon is empty. Sigmoid colon is empty collapsed. Few diverticula are noted sigmoid colon. No evidence of acute diverticulitis. Vascular/Lymphatic: No aortic aneurysm. No retroperitoneal or mesenteric adenopathy. Reproductive: Prostate gland and seminal vesicles are unremarkable. Other: Small left inguinal scrotal canal hernia containing fat without evidence of acute complication. There is no inguinal adenopathy. No ascites or free abdominal air. Tiny umbilical hernia containing fat without evidence of acute complication Musculoskeletal: No destructive bony lesions are noted. Sagittal images of the spine shows mild degenerative changes lower thoracic spine. Disc space flattening noted with vacuum disc phenomenon at L4-L5 level. IMPRESSION: 1. There is no  nephrolithiasis.  No hydronephrosis or hydroureter. 2. No calcified ureteral calculi. 3. There is distended urinary bladder. No calcified calculi are noted within urinary bladder. 4. Abundant  stool noted within cecum. No pericecal inflammation. Normal appendix. The terminal ileum is unremarkable. Moderate stool noted in right colon and proximal transverse colon. 5. Tiny umbilical hernia containing fat without evidence of acute complication. 6. No small bowel obstruction.  No ascites or free air. Electronically Signed   By: Natasha Mead M.D.   On: 03/25/2016 19:08    EKG:   Orders placed or performed during the hospital encounter of 03/25/16  . ED EKG Sinus tachycardia with VR 109.   Marland Kitchen ED EKG  . EKG 12-Lead  . EKG 12-Lead  . EKG 12-Lead  . EKG 12-Lead  . EKG 12-Lead  . EKG 12-Lead  . ED EKG  . ED EKG  . EKG 12-Lead  . EKG 12-Lead  . EKG 12-Lead  . EKG 12-Lead    IMPRESSION AND PLAN:   1. Multiple drug od. Pt hemodynamically stable, was extremely agitated on presentation in the ED, received Ativan and Dilaudid. Patient now sleeping. Hemodynamically stable. All labs unremarkable except for mild elevation of WBC which could be reactive. EKG sinus tachycardia, no QT prolongation, no ST-T abnormalities. No hypoxia. Plan: Admit to ICU for close monitoring because of multiple drug OD. Telemetry monitoring. 2. Episode of abdominal pain in ED. CT A/P neg for acute abdomen, received iv dilaudid with resolution of abdominal pain. Monitor. 3. Leucocytosis, likely  reactive. Pt not ill looking, not febrile. Doubt sepsis. Plan: Check urinalysis, blood and urine cultures ordered. Monitor, follow temperature curve, CBC, cultures. 4. Hypokalemia, mild. Replace K, f/u bmp 6. Depression/ bipolar disorder. Hold amityrpyline b/o OD. Psyche consult requested by ED physicain, Suicide precautions, f/u psyche consultation.    All the records are reviewed and case discussed with ED provider. Management plans discussed with the patient, family and they are in agreement.  CODE STATUS: Full code.   TOTAL TIME TAKING CARE OF THIS PATIENT: 50 minutes.    Jonnie Kind N M.D on 03/25/2016 at 9:04  PM  Between 7am to 6pm - Pager - [phone]  After 6pm go to [email or web address] - password [email or web address] [facility]  [place] [facility]  Office  [phone]  CC: Primary care physician; No primary care provider on file.

## 2016-03-25 NOTE — ED Notes (Signed)
Poison control called at this time, this RN spoke to [place]. Orders given to repeat EKG x 2 hours. Rehydrate with fluids, give benzo's as needed for increased agitation/seizures. Cardiac monitor as well. MD Ladona Ridgel made aware at this time who verbalized and acknowledged these orders.

## 2016-03-25 NOTE — ED Notes (Signed)
Dr. Taylor at bedside.

## 2016-03-25 NOTE — ED Notes (Signed)
Poison Control following recommendations:   1) Replacement of K+ 2) Repeat EKG in 4-6 hrs

## 2016-03-25 NOTE — ED Notes (Signed)
Patient transported to ct with Engineer, materials

## 2016-03-25 NOTE — ED Notes (Signed)
  Blood cultures drawn at this time, both sets.

## 2016-03-25 NOTE — ED Notes (Signed)
Lab called regarding add on levels at this time, will add on.

## 2016-03-26 LAB — BASIC METABOLIC PANEL
ANION GAP: 7 (ref 5–15)
BUN: 12 mg/dL (ref 6–20)
CO2: 21 mmol/L — ABNORMAL LOW (ref 22–32)
Calcium: 8.1 mg/dL — ABNORMAL LOW (ref 8.9–10.3)
Chloride: 106 mmol/L (ref 101–111)
Creatinine, Ser: 0.77 mg/dL (ref 0.61–1.24)
Glucose, Bld: 132 mg/dL — ABNORMAL HIGH (ref 65–99)
POTASSIUM: 3.8 mmol/L (ref 3.5–5.1)
SODIUM: 134 mmol/L — AB (ref 135–145)

## 2016-03-26 LAB — CBC
HCT: 35.7 % — ABNORMAL LOW (ref 40.0–52.0)
HEMOGLOBIN: 12.3 g/dL — AB (ref 13.0–18.0)
MCH: 30.4 pg (ref 26.0–34.0)
MCHC: 34.5 g/dL (ref 32.0–36.0)
MCV: 88.2 fL (ref 80.0–100.0)
PLATELETS: 258 10*3/uL (ref 150–440)
RBC: 4.04 MIL/uL — AB (ref 4.40–5.90)
RDW: 13.4 % (ref 11.5–14.5)
WBC: 8.9 10*3/uL (ref 3.8–10.6)

## 2016-03-26 MED ORDER — HALOPERIDOL LACTATE 5 MG/ML IJ SOLN
5.0000 mg | Freq: Four times a day (QID) | INTRAMUSCULAR | Status: DC | PRN
  Administered 2016-03-28: 16:00:00 5 mg via INTRAVENOUS
  Filled 2016-03-26: qty 1

## 2016-03-26 MED ORDER — DIAZEPAM 5 MG/ML IJ SOLN
5.0000 mg | Freq: Three times a day (TID) | INTRAMUSCULAR | Status: DC
  Administered 2016-03-26 – 2016-03-28 (×6): 5 mg via INTRAVENOUS
  Filled 2016-03-26 (×6): qty 2

## 2016-03-26 NOTE — Plan of Care (Signed)
Problem: Safety: Goal: Ability to remain free from injury will improve Outcome: Progressing Sitter at bedside   

## 2016-03-26 NOTE — Progress Notes (Signed)
[facility] - [place] at [facility]                                                                                                                                                                                            Patient Demographics   Curtis Cobb, is a 45 y.o. male, DOB - 26-Apr-1971, FXT:[id number]  Admit date - 03/25/2016   Admitting Physician Crissie Figures, MD  Outpatient Primary MD for the patient is No primary care provider on file.   LOS - 1  Subjective: Patient currently sedated on. Precedex for agitation     Review of Systems:   CONSTITUTIONAL:Sedated  Vitals:   Filed Vitals:   03/26/16 0600 03/26/16 0700 03/26/16 0800 03/26/16 0900  BP: 134/97 133/92 133/98 130/90  Pulse: 58 62 56 59  Temp:  97.7 F (36.5 C)    TempSrc:      Resp: 14 14 15 17   Height:      Weight:      SpO2: 100% 100% 100% 100%    Wt Readings from Last 3 Encounters:  03/26/16 93.8 kg (206 lb 12.7 oz)  11/18/15 85.73 kg (189 lb)  11/18/15 87.227 kg (192 lb 4.8 oz)     Intake/Output Summary (Last 24 hours) at 03/26/16 1035 Last data filed at 03/26/16 0600  Gross per 24 hour  Intake  992.3 ml  Output    500 ml  Net  492.3 ml    Physical Exam:   GENERAL:Critically ill-appearing male sedated HEAD, EYES, EARS, NOSE AND THROAT: Atraumatic, normocephalic.  Sclerae anicteric. No conjunctival injection. No oro-pharyngeal erythema.  NECK: Supple. There is no jugular venous distention. No bruits, no lymphadenopathy, no thyromegaly.  HEART: Regular rate and rhythm,. No murmurs, no rubs, no clicks.  LUNGS: Clear to auscultation bilaterally. No rales or rhonchi. No wheezes.  ABDOMEN: Soft, flat, nontender, nondistended. Has good bowel sounds. No hepatosplenomegaly appreciated.  EXTREMITIES: No evidence of any cyanosis, clubbing, or peripheral edema.  +2 pedal and radial pulses bilaterally.  NEUROLOGIC: Sedated on Precedex SKIN: Moist and warm with no rashes  appreciated.  Psych: Sedated currently LN: No inguinal LN enlargement    Antibiotics   Anti-infectives    None      Medications   Scheduled Meds: . diazepam  5 mg Intravenous 3 times per day  . enoxaparin (LOVENOX) injection  40 mg Subcutaneous Q24H  . potassium chloride  20 mEq Oral Once  . sodium chloride flush  3 mL Intravenous Q12H   Continuous Infusions: . sodium chloride 125 mL/hr at 03/25/16 2307  . dexmedetomidine 0.7 mcg/kg/hr (03/26/16 0800)  PRN Meds:.haloperidol lactate, LORazepam, morphine injection   Data Review:   Micro Results Recent Results (from the past 240 hour(s))  Urine culture     Status: None (Preliminary result)   Collection Time: 03/25/16  8:45 PM  Result Value Ref Range Status   Specimen Description URINE, RANDOM  Final   Special Requests NONE  Final   Culture NO GROWTH < 12 HOURS  Final   Report Status PENDING  Incomplete  MRSA PCR Screening     Status: None   Collection Time: 03/25/16 10:32 PM  Result Value Ref Range Status   MRSA by PCR NEGATIVE NEGATIVE Final    Comment:        The GeneXpert MRSA Assay (FDA approved for NASAL specimens only), is one component of a comprehensive MRSA colonization surveillance program. It is not intended to diagnose MRSA infection nor to guide or monitor treatment for MRSA infections.     Radiology Reports Ct Renal Stone Study  03/25/2016  CLINICAL DATA:  Bilateral flank pain, right lower quadrant pain, right groin pain starting this afternoon EXAM: CT ABDOMEN AND PELVIS WITHOUT CONTRAST TECHNIQUE: Multidetector CT imaging of the abdomen and pelvis was performed following the standard protocol without IV contrast. COMPARISON:  None. FINDINGS: Lower chest:  Lung bases are unremarkable.  Small hiatal hernia. Hepatobiliary: Unenhanced liver shows no biliary ductal dilatation. Mild distended gallbladder. No calcified gallstones. No intrahepatic biliary ductal dilatation. No CBD dilatation. Pancreas:  Mild dilatation of main pancreatic duct up to 3.4 mm. No peripancreatic fluid. No definite mass is noted on this unenhanced scan. Spleen: Unenhanced spleen is unremarkable. Adrenals/Urinary Tract: No adrenal gland mass. Unenhanced kidneys are symmetrical in size. No nephrolithiasis. No hydronephrosis or hydroureter. No calcified ureteral calculi. A distended urinary bladder is noted. No calcified calculi are noted within urinary bladder. Stomach/Bowel: There is no small bowel obstruction. No gastric outlet obstruction. Abundant stool noted in cecum. Moderate stool noted in right colon. Moderate stool noted in proximal transverse colon. No pericecal inflammation. Normal appendix is noted in axial image 64. The terminal ileum is unremarkable. No mesenteric fluid collection. The descending colon is empty. Sigmoid colon is empty collapsed. Few diverticula are noted sigmoid colon. No evidence of acute diverticulitis. Vascular/Lymphatic: No aortic aneurysm. No retroperitoneal or mesenteric adenopathy. Reproductive: Prostate gland and seminal vesicles are unremarkable. Other: Small left inguinal scrotal canal hernia containing fat without evidence of acute complication. There is no inguinal adenopathy. No ascites or free abdominal air. Tiny umbilical hernia containing fat without evidence of acute complication Musculoskeletal: No destructive bony lesions are noted. Sagittal images of the spine shows mild degenerative changes lower thoracic spine. Disc space flattening noted with vacuum disc phenomenon at L4-L5 level. IMPRESSION: 1. There is no nephrolithiasis.  No hydronephrosis or hydroureter. 2. No calcified ureteral calculi. 3. There is distended urinary bladder. No calcified calculi are noted within urinary bladder. 4. Abundant stool noted within cecum. No pericecal inflammation. Normal appendix. The terminal ileum is unremarkable. Moderate stool noted in right colon and proximal transverse colon. 5. Tiny umbilical  hernia containing fat without evidence of acute complication. 6. No small bowel obstruction.  No ascites or free air. Electronically Signed   By: Natasha Mead M.D.   On: 03/25/2016 19:08     CBC  Recent Labs Lab 03/25/16 1436 03/26/16 0403  WBC 16.9* 8.9  HGB 14.2 12.3*  HCT 42.2 35.7*  PLT 333 258  MCV 86.8 88.2  MCH 29.3 30.4  MCHC 33.7 34.5  RDW 13.3 13.4    Chemistries   Recent Labs Lab 03/25/16 1436 03/26/16 0403  NA 133* 134*  K 3.2* 3.8  CL 100* 106  CO2 22 21*  GLUCOSE 129* 132*  BUN 17 12  CREATININE 1.11 0.77  CALCIUM 9.2 8.1*  AST 59*  --   ALT 75*  --   ALKPHOS 57  --   BILITOT 0.9  --    ------------------------------------------------------------------------------------------------------------------ estimated creatinine clearance is 140.7 mL/min (by C-G formula based on Cr of 0.77). ------------------------------------------------------------------------------------------------------------------ No results for input(s): HGBA1C in the last 72 hours. ------------------------------------------------------------------------------------------------------------------ No results for input(s): CHOL, HDL, LDLCALC, TRIG, CHOLHDL, LDLDIRECT in the last 72 hours. ------------------------------------------------------------------------------------------------------------------ No results for input(s): TSH, T4TOTAL, T3FREE, THYROIDAB in the last 72 hours.  Invalid input(s): FREET3 ------------------------------------------------------------------------------------------------------------------ No results for input(s): VITAMINB12, FOLATE, FERRITIN, TIBC, IRON, RETICCTPCT in the last 72 hours.  Coagulation profile No results for input(s): INR, PROTIME in the last 168 hours.  No results for input(s): DDIMER in the last 72 hours.  Cardiac Enzymes No results for input(s): CKMB, TROPONINI, MYOGLOBIN in the last 168 hours.  Invalid input(s):  CK ------------------------------------------------------------------------------------------------------------------ Invalid input(s): POCBNP    Assessment & Plan   1. Multiple drug od.  Patient currently sedated on Precedex drip due to agitation Hemodynamically stable Continue to monitor once awake will ask psychiatry to see  2. Episode of abdominal pain in ED. CT A/P neg for acute abdomen, received iv dilaudid with resolution of abdominal pain. Monitor. 3. Leucocytosis, likely reactive. Likely reactive  4. Hypokalemia, replaced 5. Alcohol abuse: Currently on Precedex drip 6. Depression/ bipolar disorder. Psych meds on hold, Suicide precautions, f/u psyche consultation.     Code Status Orders        Start     Ordered   03/25/16 2054  Full code   Continuous     03/25/16 2102    Code Status History    Date Active Date Inactive Code Status Order ID Comments User Context   11/18/2015  4:59 PM 11/20/2015  7:45 PM Full Code [id number]  Currie Paris, RN Inpatient   11/17/2015  6:50 PM 11/18/2015  4:59 PM Full Code [id number]  Beau Fanny, MD Inpatient   11/13/2015  6:04 AM 11/17/2015  6:50 PM Full Code [id number]  Arnaldo Natal, MD Inpatient   10/07/2015  6:33 PM 10/12/2015  8:30 PM Full Code [id number]  Jimmy Footman, MD Inpatient   07/12/2015  2:38 PM 07/16/2015  5:54 PM Full Code [id number]  Charm Rings, NP Inpatient   07/10/2015  7:17 PM 07/12/2015  2:38 PM Full Code [id number]  Azalia Bilis, MD ED   06/25/2015  6:24 AM 06/26/2015  3:10 PM Full Code [id number]  Paula Libra, MD ED   03/17/2014 11:15 AM 03/22/2014 10:25 AM Full Code [id number]  Thresa Ross, MD Inpatient   03/17/2014 11:00 AM 03/17/2014 11:15 AM Full Code [id number]  Nanine Means, NP Inpatient   03/15/2014 10:54 PM 03/17/2014 11:00 AM Full Code [id number]  Warnell Forester, MD ED   02/10/2014  3:11 PM 02/12/2014  6:28 PM Full Code [id number]  Trevor Mace, PA-C ED           Consults  Psychiatry   DVT  Prophylaxis  Lovenox  Lab Results  Component Value Date   PLT 258 03/26/2016     Time Spent in minutes   35 minutes of critical care time spent patient at high risk of cardiopulmonary arrest    Atticus Lemberger, [facility]  M.D on 03/26/2016 at 10:35 AM  Between 7am to 6pm - Pager - [phone]  After 6pm go to [email or web address] - password [email or web address] [facility]  [facility] [place] [facility]   Office  [phone]

## 2016-03-26 NOTE — Care Management (Addendum)
Patient with long history of bipolar disorders and depression admitted with intentional drug overdose.  He says he has no family at all - they are all deceased.  Was living with his girlfriend on [facility] and he is not gong to be allowed to return.  Patient was followed at [facility] in [facility].  Has been at the local shelter in  "years ago" and says would return if able.  Updated CSW.  Patient is very drowsy and has a Comptroller and under ivc.  He say meds taken were his own but can not provide the name of physician that provided scripts for them.

## 2016-03-27 DIAGNOSIS — T50901A Poisoning by unspecified drugs, medicaments and biological substances, accidental (unintentional), initial encounter: Secondary | ICD-10-CM | POA: Diagnosis present

## 2016-03-27 LAB — BASIC METABOLIC PANEL
Anion gap: 4 — ABNORMAL LOW (ref 5–15)
BUN: 12 mg/dL (ref 6–20)
CO2: 24 mmol/L (ref 22–32)
Calcium: 7.8 mg/dL — ABNORMAL LOW (ref 8.9–10.3)
Chloride: 110 mmol/L (ref 101–111)
Creatinine, Ser: 0.82 mg/dL (ref 0.61–1.24)
GFR calc Af Amer: >60 mL/min (ref >60)
GLUCOSE: 91 mg/dL (ref 65–99)
Potassium: 3.5 mmol/L (ref 3.5–5.1)
SODIUM: 138 mmol/L (ref 135–145)

## 2016-03-27 LAB — URINE CULTURE: CULTURE: NO GROWTH

## 2016-03-27 MED ORDER — DILTIAZEM HCL 60 MG PO TABS: 60.0000 mg | ORAL_TABLET | Freq: Three times a day (TID) | ORAL | Status: DC

## 2016-03-27 NOTE — Progress Notes (Signed)
[facility] - [place] at [facility]                                                                                                                                                                                            Patient Demographics   Curtis Cobb, is a 45 y.o. male, DOB - Feb 03, 1971, WJX:[id number]  Admit date - 03/25/2016   Admitting Physician Crissie Figures, MD  Outpatient Primary MD for the patient is No primary care provider on file.   LOS - 2  Subjective: Patient off the Precedex drip. According to the nurse not agitated. Intermittently elevated heart rate. Complains of aches all over     Review of Systems:    CONSTITUTIONAL: No documented fever. No fatigue, weakness. No weight gain, no weight loss.  EYES: No blurry or double vision.  ENT: No tinnitus. No postnasal drip. No redness of the oropharynx.  RESPIRATORY: No cough, no wheeze, no hemoptysis. No dyspnea.  CARDIOVASCULAR: No chest pain. No orthopnea. No palpitations. No syncope.  GASTROINTESTINAL: No nausea, no vomiting or diarrhea. No abdominal pain. No melena or hematochezia.  GENITOURINARY:  No urgency. No frequency. No dysuria. No hematuria. No obstructive symptoms. No discharge. No pain. No significant abnormal bleeding ENDOCRINE: No polyuria or nocturia. No heat or cold intolerance.  HEMATOLOGY: No anemia. No bruising. No bleeding. No purpura. No petechiae INTEGUMENTARY: No rashes. No lesions.  MUSCULOSKELETAL: No arthritis. No swelling. No gout. Complains of pain in multiple areas of his body NEUROLOGIC: No numbness, tingling, or ataxia. No seizure-type activity.  PSYCHIATRIC: No anxiety. No insomnia. No ADD.     Vitals:   Filed Vitals:   03/27/16 0500 03/27/16 0600 03/27/16 0700 03/27/16 0900  BP: 117/86 120/88 110/81   Pulse: 71 74 76   Temp:    98.1 F (36.7 C)  TempSrc:      Resp: 15 21 27    Height:      Weight:      SpO2: 97% 98% 96%     Wt Readings from Last  3 Encounters:  03/27/16 95.2 kg (209 lb 14.1 oz)  11/18/15 85.73 kg (189 lb)  11/18/15 87.227 kg (192 lb 4.8 oz)     Intake/Output Summary (Last 24 hours) at 03/27/16 1037 Last data filed at 03/27/16 0951  Gross per 24 hour  Intake    363 ml  Output   2975 ml  Net  -2612 ml    Physical Exam:   GENERAL: In no acute distress HEAD, EYES, EARS, NOSE AND THROAT: Atraumatic, normocephalic.  Sclerae anicteric. No conjunctival injection. No oro-pharyngeal erythema.  NECK:  Supple. There is no jugular venous distention. No bruits, no lymphadenopathy, no thyromegaly.  HEART: Regular rate and rhythm,. No murmurs, no rubs, no clicks.  LUNGS: Clear to auscultation bilaterally. No rales or rhonchi. No wheezes.  ABDOMEN: Soft, flat, nontender, nondistended. Has good bowel sounds. No hepatosplenomegaly appreciated.  EXTREMITIES: No evidence of any cyanosis, clubbing, or peripheral edema.  +2 pedal and radial pulses bilaterally.  NEUROLOGIC: Awake alert oriented 3 no focal deficits cranial nerves II-12 grossly intact SKIN: Moist and warm with no rashes appreciated.  Psych: Currently not anxious or depressed LN: No inguinal LN enlargement    Antibiotics   Anti-infectives    None      Medications   Scheduled Meds: . diazepam  5 mg Intravenous 3 times per day  . diltiazem  60 mg Oral 3 times per day  . enoxaparin (LOVENOX) injection  40 mg Subcutaneous Q24H  . potassium chloride  20 mEq Oral Once  . sodium chloride flush  3 mL Intravenous Q12H   Continuous Infusions: . sodium chloride 125 mL/hr at 03/27/16 0131   PRN Meds:.haloperidol lactate, LORazepam, morphine injection   Data Review:   Micro Results Recent Results (from the past 240 hour(s))  Urine culture     Status: None   Collection Time: 03/25/16  8:45 PM  Result Value Ref Range Status   Specimen Description URINE, RANDOM  Final   Special Requests NONE  Final   Culture NO GROWTH 2 DAYS  Final   Report Status  03/27/2016 FINAL  Final  MRSA PCR Screening     Status: None   Collection Time: 03/25/16 10:32 PM  Result Value Ref Range Status   MRSA by PCR NEGATIVE NEGATIVE Final    Comment:        The GeneXpert MRSA Assay (FDA approved for NASAL specimens only), is one component of a comprehensive MRSA colonization surveillance program. It is not intended to diagnose MRSA infection nor to guide or monitor treatment for MRSA infections.   Blood culture (routine x 2)     Status: None (Preliminary result)   Collection Time: 03/25/16 10:41 PM  Result Value Ref Range Status   Specimen Description BLOOD RIGHT ANTECUBITAL  Final   Special Requests BOTTLES DRAWN AEROBIC AND ANAEROBIC  Final   Culture NO GROWTH 2 DAYS  Final   Report Status PENDING  Incomplete  Blood culture (routine x 2)     Status: None (Preliminary result)   Collection Time: 03/25/16 10:45 PM  Result Value Ref Range Status   Specimen Description BLOOD RIGHT HAND  Final   Special Requests BOTTLES DRAWN AEROBIC AND ANAEROBIC  Final   Culture NO GROWTH 2 DAYS  Final   Report Status PENDING  Incomplete    Radiology Reports Ct Renal Stone Study  03/25/2016  CLINICAL DATA:  Bilateral flank pain, right lower quadrant pain, right groin pain starting this afternoon EXAM: CT ABDOMEN AND PELVIS WITHOUT CONTRAST TECHNIQUE: Multidetector CT imaging of the abdomen and pelvis was performed following the standard protocol without IV contrast. COMPARISON:  None. FINDINGS: Lower chest:  Lung bases are unremarkable.  Small hiatal hernia. Hepatobiliary: Unenhanced liver shows no biliary ductal dilatation. Mild distended gallbladder. No calcified gallstones. No intrahepatic biliary ductal dilatation. No CBD dilatation. Pancreas: Mild dilatation of main pancreatic duct up to 3.4 mm. No peripancreatic fluid. No definite mass is noted on this unenhanced scan. Spleen: Unenhanced spleen is unremarkable. Adrenals/Urinary Tract: No adrenal gland mass.  Unenhanced kidneys are  symmetrical in size. No nephrolithiasis. No hydronephrosis or hydroureter. No calcified ureteral calculi. A distended urinary bladder is noted. No calcified calculi are noted within urinary bladder. Stomach/Bowel: There is no small bowel obstruction. No gastric outlet obstruction. Abundant stool noted in cecum. Moderate stool noted in right colon. Moderate stool noted in proximal transverse colon. No pericecal inflammation. Normal appendix is noted in axial image 64. The terminal ileum is unremarkable. No mesenteric fluid collection. The descending colon is empty. Sigmoid colon is empty collapsed. Few diverticula are noted sigmoid colon. No evidence of acute diverticulitis. Vascular/Lymphatic: No aortic aneurysm. No retroperitoneal or mesenteric adenopathy. Reproductive: Prostate gland and seminal vesicles are unremarkable. Other: Small left inguinal scrotal canal hernia containing fat without evidence of acute complication. There is no inguinal adenopathy. No ascites or free abdominal air. Tiny umbilical hernia containing fat without evidence of acute complication Musculoskeletal: No destructive bony lesions are noted. Sagittal images of the spine shows mild degenerative changes lower thoracic spine. Disc space flattening noted with vacuum disc phenomenon at L4-L5 level. IMPRESSION: 1. There is no nephrolithiasis.  No hydronephrosis or hydroureter. 2. No calcified ureteral calculi. 3. There is distended urinary bladder. No calcified calculi are noted within urinary bladder. 4. Abundant stool noted within cecum. No pericecal inflammation. Normal appendix. The terminal ileum is unremarkable. Moderate stool noted in right colon and proximal transverse colon. 5. Tiny umbilical hernia containing fat without evidence of acute complication. 6. No small bowel obstruction.  No ascites or free air. Electronically Signed   By: Natasha Mead M.D.   On: 03/25/2016 19:08     CBC  Recent Labs Lab  03/25/16 1436 03/26/16 0403  WBC 16.9* 8.9  HGB 14.2 12.3*  HCT 42.2 35.7*  PLT 333 258  MCV 86.8 88.2  MCH 29.3 30.4  MCHC 33.7 34.5  RDW 13.3 13.4    Chemistries   Recent Labs Lab 03/25/16 1436 03/26/16 0403 03/27/16 0550  NA 133* 134* 138  K 3.2* 3.8 3.5  CL 100* 106 110  CO2 22 21* 24  GLUCOSE 129* 132* 91  BUN 17 12 12   CREATININE 1.11 0.77 0.82  CALCIUM 9.2 8.1* 7.8*  AST 59*  --   --   ALT 75*  --   --   ALKPHOS 57  --   --   BILITOT 0.9  --   --    ------------------------------------------------------------------------------------------------------------------ estimated creatinine clearance is 138.3 mL/min (by C-G formula based on Cr of 0.82). ------------------------------------------------------------------------------------------------------------------ No results for input(s): HGBA1C in the last 72 hours. ------------------------------------------------------------------------------------------------------------------ No results for input(s): CHOL, HDL, LDLCALC, TRIG, CHOLHDL, LDLDIRECT in the last 72 hours. ------------------------------------------------------------------------------------------------------------------ No results for input(s): TSH, T4TOTAL, T3FREE, THYROIDAB in the last 72 hours.  Invalid input(s): FREET3 ------------------------------------------------------------------------------------------------------------------ No results for input(s): VITAMINB12, FOLATE, FERRITIN, TIBC, IRON, RETICCTPCT in the last 72 hours.  Coagulation profile No results for input(s): INR, PROTIME in the last 168 hours.  No results for input(s): DDIMER in the last 72 hours.  Cardiac Enzymes No results for input(s): CKMB, TROPONINI, MYOGLOBIN in the last 168 hours.  Invalid input(s): CK ------------------------------------------------------------------------------------------------------------------ Invalid input(s): POCBNP    Assessment & Plan    1. Multiple drug od.  More awake Hemodynamically stable ask psychiatry to see Transfer to floor with telemetry  2. Episode of abdominal pain in ED. CT A/P neg for acute abdomen, received iv dilaudid with resolution of abdominal pain. Monitor. 3. Leucocytosis, likely reactive. Likely reactive  4. Hypokalemia, replaced 5. Alcohol abuse: Currently on Precedex  drip 6. Depression/ bipolar disorder. Psych meds on hold, Suicide precautions, f/u psyche consultation.     Code Status Orders        Start     Ordered   03/25/16 2054  Full code   Continuous     03/25/16 2102    Code Status History    Date Active Date Inactive Code Status Order ID Comments User Context   11/18/2015  4:59 PM 11/20/2015  7:45 PM Full Code [id number]  Currie Paris, RN Inpatient   11/17/2015  6:50 PM 11/18/2015  4:59 PM Full Code [id number]  Beau Fanny, MD Inpatient   11/13/2015  6:04 AM 11/17/2015  6:50 PM Full Code [id number]  Arnaldo Natal, MD Inpatient   10/07/2015  6:33 PM 10/12/2015  8:30 PM Full Code [id number]  Jimmy Footman, MD Inpatient   07/12/2015  2:38 PM 07/16/2015  5:54 PM Full Code [id number]  Charm Rings, NP Inpatient   07/10/2015  7:17 PM 07/12/2015  2:38 PM Full Code [id number]  Azalia Bilis, MD ED   06/25/2015  6:24 AM 06/26/2015  3:10 PM Full Code [id number]  Paula Libra, MD ED   03/17/2014 11:15 AM 03/22/2014 10:25 AM Full Code [id number]  Thresa Ross, MD Inpatient   03/17/2014 11:00 AM 03/17/2014 11:15 AM Full Code [id number]  Nanine Means, NP Inpatient   03/15/2014 10:54 PM 03/17/2014 11:00 AM Full Code [id number]  Warnell Forester, MD ED   02/10/2014  3:11 PM 02/12/2014  6:28 PM Full Code [id number]  Trevor Mace, PA-C ED           Consults  Psychiatry   DVT Prophylaxis  Lovenox  Lab Results  Component Value Date   PLT 258 03/26/2016     Time Spent in minutes   35 min  Auburn Bilberry M.D on 03/27/2016 at 10:37 AM  Between 7am to 6pm - Pager - [phone]  After  6pm go to [email or web address] - password [email or web address] [facility]  [facility] [place] [facility]   Office  [phone]

## 2016-03-28 DIAGNOSIS — F430 Acute stress reaction: Secondary | ICD-10-CM

## 2016-03-28 DIAGNOSIS — F4321 Adjustment disorder with depressed mood: Secondary | ICD-10-CM

## 2016-03-28 DIAGNOSIS — T50901A Poisoning by unspecified drugs, medicaments and biological substances, accidental (unintentional), initial encounter: Secondary | ICD-10-CM

## 2016-03-28 MED ORDER — DIAZEPAM 2 MG PO TABS
2.0000 mg | ORAL_TABLET | Freq: Two times a day (BID) | ORAL | Status: DC
  Administered 2016-03-28 (×2): 2 mg via ORAL
  Filled 2016-03-28 (×2): qty 1

## 2016-03-28 NOTE — Progress Notes (Signed)
Pt  Is stable for discharge to behavioral health when bed is available

## 2016-03-28 NOTE — Consult Note (Addendum)
[facility] Face-to-Face Psychiatry Consult   Reason for Consult:  Overdose Referring Physician:  Dustin Flock M.D   Patient Identification: Curtis Cobb MRN:  [id number] Principal Diagnosis: OD (overdose of drug) Diagnosis:   Patient Active Problem List   Diagnosis Date Noted  . Overdose [T50.901A] 03/27/2016  . OD (overdose of drug) [T50.901A] 03/25/2016  . Abdominal pain [R10.9] 03/25/2016  . Leucocytosis [D72.829] 03/25/2016  . Hypokalemia [E87.6] 03/25/2016  . Drug overdose, multiple drugs [T50.901A] 03/25/2016  . Severe episode of recurrent major depressive disorder, without psychotic features ([place]) [F33.2]   . MDD (major depressive disorder), recurrent episode, severe ([place]) [F33.2] 11/17/2015  . Alcohol withdrawal ([place]) [F10.239] 11/13/2015  . Opioid use disorder, mild, in sustained remission [F11.90] 10/08/2015  . Social phobia [F40.10] 10/08/2015  . Substance-induced psychotic disorder with delusions ([place]) [F19.950] 10/08/2015  . Alcohol use disorder, severe, dependence ([place]) [F10.20] 10/07/2015  . Cannabis use disorder, severe, dependence ([place]) [F12.20] 10/07/2015  . 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 HTN (hypertension) [I10] 10/07/2015  . Rheumatoid arthritis ([place]) [M06.9] 10/07/2015    Total Time spent with patient: 45 minutes  Subjective:   Curtis Cobb is a 45 y.o. male patient with h/o bipolar disorder admitted following overdose on multiple medications. There are variable reports per records about the amounts of medications he took. It is reported the pt ingested at least 70 Adderall 62m tablets since Saturday, 03-21-16. He also consumed tianeptine 16oz on the night of 03-24-16.  The pt reportedly experienced SI on the night of 03-24-16. Further notes demonstrate the pt took multiple doses of amitriptyline, gabapentin, Adderall and tianeptine sodium. The pt was anxious and paranoid on arrival to the ED. Pt was  Per ED notes, the pt also took an OTC  medication called tianeptine sodium.  In the ED, the pt was extremely anxious and paranoid. A pt's family member sat with the pt all night. At some point, the pt was able to ingest 12 more Adderall.    Per the admission H&P, he was extremely agitated on arrival to the ED and vital signs were stable. Patient was not hypoxic. Lab work revealed normal BMP except for potassium 3.2, LFTs normal, WBC is 16.9. Urine drug screen positive for amitriptyline, barbiturates and tricyclics. Serum alcohol level less than 10, serum acetaminophen levels WNL. EKG sinus tachycardia with ventricular of 10 9 bpm, no QT prolongation, no acute ischemic changes. [place] contacted by the ED physician who recommended admission for close monitoring with follow-up EKGs. Patient had 4 EKGs done while in the ED which were unremarkable. While in the ED patient complained of severe abdominal pain for which a CT of the abdomen and pelvis was obtained which was reported negative for acute abdominal pathology. Patient received Ativan and Dilaudid while in the ED following which his agitation resolved and is currently in deep sleep and under influence of sedation. Per nursing staff, patient did not have any problems since receiving Dilaudid.   Pt was admitted to the ICU for monitoring. On 03-25-16, he was started on Precedex and valium 570mpo Q6h with good results. It appear Precedex was discontinued around 03-27-2016. IV Valium was changed to oral on 03-28-2016.   Today on interview, the pt is resting in bed watching television. He is slow to respond at times but his speech is clear. He was initially guarded but then discussed issues in more detail.   His mother died unexpectedly 4 years ago on 02-18-2011. Her  birthday is [birth year] 2. Prior to this, the pt did not have to take psychotropic medications. Two years ago, he began hearing voices which started in the Spring. He denies issues with hallucinations or psychosis prior to  the death of his mother. The pt did not participate in psychotherapy or grief counseling after the death of his mother. Pt was tearful when discussing his mother and her death.   He feels he started hearing 77-4 male and male voices this past Wednesday, 03-25-16. He would not tell me specifically what the voices were saying but they were not command in nature. He did share the voices were making derogatory comments about him.  He recognized 2 of the voices but would not share who the voices belonged to.   The duration of voices, when he hears them is 30-60 minutes. They tend to occur during times of stress and when he is feeling overwhelmed. He has heard voices when he is off of medications but tends to hear them more when stressed. He shared when stressed, he will take more medication than prescribed which can potentiate the voices. Adderall will potentiate the voices sometimes but not consistently. He takes Adderall nearly every day and has, at times, taken more than prescribed.    He hears voices when he tries to accomplish a task or when trying to help someone but he fails. He tends to take medications when he hears the voices because it causes resolution of voices sooner rather than later. The pt states it is difficult to wait 30-60 minutes for the voices to resolve.   The pt denies he was trying to kill or harm himself in any way when he took the medications. He shared he was looking for a sense of well-being and calm. Curtis Cobb shared tianeptine is a powder he places on his tongue and then swallows.   Pt denies symptoms consistent with mania, depression and excessive worry. The pt denies SI, HI and AVH.   Past Psychiatric History: Dx: Depression Psychiatrist: Yes with Dr. Rosine Door in [place], [place] Therapist: Denies Hospitalizations: Denies ECT: Denies Suicide attempt/Self-harm: "I don't think so. That seems foggy for some reason."  Homicide attempts/harming others: Denies Previous Medication  Trials:   - Amitriptyline  - Zoloft  - Wellbutrin  - Buspar  - "several others"  Risk to Self: Is patient at risk for suicide?: Yes Risk to Others:   Prior Inpatient Therapy:   Prior Outpatient Therapy:    Past Medical History:  Past Medical History  Diagnosis Date  . Bipolar 1 disorder ([place])   . Schizophrenia ([place])   . Depression   . Anxiety   . Rheumatoid arthritis ([place])   . Hypertension   . Schizoaffective disorder, depressive type ([place]) 10/06/2015  . Alcohol use disorder, moderate, dependence ([place]) 02/13/2014   History reviewed. No pertinent past surgical history. Family History:  Family History  Problem Relation Age of Onset  . Family history unknown: Yes   Family Psychiatric History:  Depression: Mother, maternal aunts, maternal grandfather Suicide: Denies   Social History:  History  Alcohol Use  . Yes    Comment: gallon/day     History  Drug Use  . Yes  . Special: Amphetamines    Comment: THC    Social History   Social History  . Marital Status: Single    Spouse Name: N/A  . Number of Children: N/A  . Years of Education: N/A   Social History Main Topics  . Smoking status: Former  Smoker -- 0.00 packs/day for 0 years  . Smokeless tobacco: None  . Alcohol Use: Yes     Comment: gallon/day  . Drug Use: Yes    Special: Amphetamines     Comment: THC  . Sexual Activity: Yes    Birth Control/ Protection: None   Other Topics Concern  . None   Social History Narrative   Additional Social History:    Allergies:   Allergies  Allergen Reactions  . Sulfa Antibiotics Other (See Comments)    Unknown reaction    Labs:  Results for orders placed or performed during the hospital encounter of 03/25/16 (from the past 48 hour(s))  Basic metabolic panel     Status: Abnormal   Collection Time: 03/27/16  5:50 AM  Result Value Ref Range   Sodium 138 135 - 145 mmol/L   Potassium 3.5 3.5 - 5.1 mmol/L   Chloride 110 101 - 111 mmol/L   CO2 24 22 - 32  mmol/L   Glucose, Bld 91 65 - 99 mg/dL   BUN 12 6 - 20 mg/dL   Creatinine, Ser 0.82 0.61 - 1.24 mg/dL   Calcium 7.8 (L) 8.9 - 10.3 mg/dL   GFR calc non Af Amer >60 >60 mL/min   GFR calc Af Amer >60 >60 mL/min    Comment: (NOTE) The eGFR has been calculated using the CKD EPI equation. This calculation has not been validated in all clinical situations. eGFR's persistently <60 mL/min signify possible Chronic Kidney Disease.    Anion gap 4 (L) 5 - 15    Current Facility-Administered Medications  Medication Dose Route Frequency Provider Last Rate Last Dose  . diazepam (VALIUM) tablet 2 mg  2 mg Oral Q12H Loletha Grayer, MD   2 mg at 03/28/16 1310  . enoxaparin (LOVENOX) injection 40 mg  40 mg Subcutaneous Q24H Juluis Mire, MD   40 mg at 03/27/16 2111  . haloperidol lactate (HALDOL) injection 5 mg  5 mg Intravenous Q6H PRN Flora Lipps, MD      . potassium chloride SA (K-DUR,KLOR-CON) CR tablet 20 mEq  20 mEq Oral Once Juluis Mire, MD   20 mEq at 03/26/16 0035  . sodium chloride flush (NS) 0.9 % injection 3 mL  3 mL Intravenous Q12H Juluis Mire, MD   3 mL at 03/27/16 2112    Musculoskeletal: Strength & Muscle Tone: within normal limits Gait & Station: not assessed Patient leans: N/A  Psychiatric Specialty Exam: Review of Systems  Constitutional: Positive for malaise/fatigue.  HENT: Negative.   Eyes: Negative.   Respiratory: Negative.   Cardiovascular: Negative.   Gastrointestinal: Negative.   Genitourinary: Negative.   Musculoskeletal: Negative.   Skin: Negative.   Neurological: Negative.   Endo/Heme/Allergies: Negative.   Psychiatric/Behavioral: Positive for substance abuse. Negative for depression, suicidal ideas, hallucinations and memory loss. The patient is not nervous/anxious and does not have insomnia.     Blood pressure 139/68, pulse 72, temperature 97.5 F (36.4 C), temperature source Oral, resp. rate 20, height 5' 11"  (1.803 m), weight 91.853 kg (202  lb 8 oz), SpO2 96 %.Body mass index is 28.26 kg/(m^2).  General Appearance: Casual and appears younger than stated age  Eye Contact::  Good  Speech:  Clear and Coherent and Normal Rate  Volume:  Normal  Mood:  "I'm not doing that well."   Affect:  Depressed and Tearful  Thought Process:  Coherent, Goal Directed, Intact, Linear and Logical  Orientation:  Full (Time, Place, and  Person)  Thought Content:  Negative  Suicidal Thoughts:  No  Homicidal Thoughts:  No  Memory:  Immediate;   Good Recent;   Good Remote;   Good  Judgement:  Poor  Insight:  Fair  Psychomotor Activity:  Decreased  Concentration:  Good  Recall:  Good  Fund of Knowledge:Good  Language: Good  Akathisia:  Negative  Handed:  Not asked  AIMS (if indicated):     Assets:  Communication Skills Desire for Improvement Talents/Skills  ADL's:  Intact  Cognition: WNL  Sleep:      Treatment Plan Summary: Plan Recommend inpatient psychiatric admission for current symptoms  Disposition: Recommend psychiatric Inpatient admission when medically cleared.   Pt likely has acute stress reaction and complicated bereavement. Symptoms started with the unexpected death of his mother on 15-Mar-2011 with AH starting 2 years after his mother's death. The pt has several stressors including the aforementioned along with the ending of a relationship and not having anywhere to live. Pt would benefit from inpatient psychiatric treatment to help deal with issues of complicated bereavement and stress. Stress appears to cause the pt to develop voices in turn causing him to use more medications than prescribed. Pt denies SI, HI and AVH at the time of this interview.   1. No medication recommendations at this time due to recent overdose.   - Do not restart Adderall.   - Do not restart antidepressants at this time including OTC tianeptine which is similar to TCA.   2. Pt will need inpatient psychiatric hospitalization due to failure of outpatient  treatment.    Donita Brooks, MD 03/28/2016 3:34 PM

## 2016-03-28 NOTE — Progress Notes (Signed)
Patient alert and oriented, sitter at bedside, patient is depressed, no plan to harm self at this time, Patient with flat affect, Patient v/s stable.

## 2016-03-28 NOTE — Plan of Care (Addendum)
Problem: Bowel/Gastric: Goal: Will not experience complications related to bowel motility Patient has slept most of the day, Patient is oriented x 4, nurse ask him if He was glad to still be alive and He states " I guess, He states if I have the right medicine I can do just fine, I ran out of my medicine" Patient with iv fluids that are infusing at 125cc/hr, Patient has good output,Patient drinking good,no bm today noted.   Patient with good appetite, Patient v/s stable. Patient with sitter at bedside. Patient is safe. Nurse will continue to monitor. Patient can be discharged to [facility] after psych Doctor evaluates him, Dr. Fonnie Birkenhead has states that He is medically clear.

## 2016-03-28 NOTE — Progress Notes (Signed)
Patient ID: Curtis Cobb, male   DOB: 1971-10-20, 45 y.o.   MRN: [id number]  Await Psych consult Medically can go to psych floor Change iv valium to oral to prevent withdrawal  Alford Highland

## 2016-03-28 NOTE — Care Management Note (Signed)
Case Management Note  Patient Details  Name: Curtis Cobb MRN: [id number] Date of Birth: [birth year]  Subjective/Objective:       Mr Pavon's current discharge plan is to be discharged to the Behavioral Health Unit per Dr Mathews Robinsons note.             Action/Plan:   Expected Discharge Date:                  Expected Discharge Plan:     In-House Referral:     Discharge planning Services     Post Acute Care Choice:    Choice offered to:     DME Arranged:    DME Agency:     HH Arranged:    HH Agency:     Status of Service:     Medicare Important Message Given:    Date Medicare IM Given:    Medicare IM give by:    Date Additional Medicare IM Given:    Additional Medicare Important Message give by:     If discussed at Long Length of Stay Meetings, dates discussed:    Additional Comments:  Mallarie Voorhies A, RN 03/28/2016, 3:10 PM

## 2016-03-28 NOTE — Progress Notes (Signed)
Patient ID: Curtis Cobb, male   DOB: 10-22-1971, 45 y.o.   MRN: [id number] [facility] PROGRESS NOTE  CARVIN ALMAS WIO:[id number] DOB: 1971-06-27 DOA: 03/25/2016 PCP: No primary care provider on file.  HPI/Subjective: Patient physically feels okay. No further abdominal pain. Patient states that he's been very depressed. He also has insomnia where he is awake for 5 or 6 days. He is frustrated that the medications do not help him. He stated he took too many medications at once because he is frustrated.  Objective: Filed Vitals:   03/27/16 2009 03/28/16 0451  BP: 136/74 139/68  Pulse: 79 72  Temp: 98.5 F (36.9 C) 97.5 F (36.4 C)  Resp: 20 20    Intake/Output Summary (Last 24 hours) at 03/28/16 1559 Last data filed at 03/28/16 1520  Gross per 24 hour  Intake   3416 ml  Output   2950 ml  Net    466 ml   Filed Weights   03/26/16 0000 03/27/16 0410 03/28/16 0451  Weight: 93.8 kg (206 lb 12.7 oz) 95.2 kg (209 lb 14.1 oz) 91.853 kg (202 lb 8 oz)    ROS: Review of Systems  Constitutional: Negative for fever and chills.  Eyes: Negative for blurred vision.  Respiratory: Negative for cough and shortness of breath.   Cardiovascular: Negative for chest pain.  Gastrointestinal: Negative for nausea, vomiting, abdominal pain, diarrhea and constipation.  Genitourinary: Negative for dysuria.  Musculoskeletal: Negative for joint pain.  Neurological: Negative for dizziness and headaches.   Exam: Physical Exam  Constitutional: He is oriented to person, place, and time.  HENT:  Nose: No mucosal edema.  Mouth/Throat: No oropharyngeal exudate or posterior oropharyngeal edema.  Eyes: Conjunctivae, EOM and lids are normal. Pupils are equal, round, and reactive to light.  Neck: No JVD present. Carotid bruit is not present. No edema present. No thyroid mass and no thyromegaly present.  Cardiovascular: S1 normal and S2 normal.  Exam reveals no gallop.   No murmur heard. Pulses:       Dorsalis pedis pulses are 2+ on the right side, and 2+ on the left side.  Respiratory: No respiratory distress. He has no wheezes. He has no rhonchi. He has no rales.  GI: Soft. Bowel sounds are normal. There is no tenderness.  Musculoskeletal:       Right ankle: He exhibits no swelling.       Left ankle: He exhibits no swelling.  Lymphadenopathy:    He has no cervical adenopathy.  Neurological: He is alert and oriented to person, place, and time. No cranial nerve deficit.  Skin: Skin is warm. No rash noted. Nails show no clubbing.  Psychiatric: His affect is blunt. He exhibits a depressed mood.      Data Reviewed: Basic Metabolic Panel:  Recent Labs Lab 03/25/16 1436 03/26/16 0403 03/27/16 0550  NA 133* 134* 138  K 3.2* 3.8 3.5  CL 100* 106 110  CO2 22 21* 24  GLUCOSE 129* 132* 91  BUN 17 12 12   CREATININE 1.11 0.77 0.82  CALCIUM 9.2 8.1* 7.8*   Liver Function Tests:  Recent Labs Lab 03/25/16 1436  AST 59*  ALT 75*  ALKPHOS 57  BILITOT 0.9  PROT 8.2*  ALBUMIN 4.7   CBC:  Recent Labs Lab 03/25/16 1436 03/26/16 0403  WBC 16.9* 8.9  HGB 14.2 12.3*  HCT 42.2 35.7*  MCV 86.8 88.2  PLT 333 258    CBG:  Recent Labs Lab 03/25/16 2230  GLUCAP 112*    Recent Results (from the past 240 hour(s))  Urine culture     Status: None   Collection Time: 03/25/16  8:45 PM  Result Value Ref Range Status   Specimen Description URINE, RANDOM  Final   Special Requests NONE  Final   Culture NO GROWTH 2 DAYS  Final   Report Status 03/27/2016 FINAL  Final  MRSA PCR Screening     Status: None   Collection Time: 03/25/16 10:32 PM  Result Value Ref Range Status   MRSA by PCR NEGATIVE NEGATIVE Final    Comment:        The GeneXpert MRSA Assay (FDA approved for NASAL specimens only), is one component of a comprehensive MRSA colonization surveillance program. It is not intended to diagnose MRSA infection nor to guide or monitor treatment for MRSA infections.    Blood culture (routine x 2)     Status: None (Preliminary result)   Collection Time: 03/25/16 10:41 PM  Result Value Ref Range Status   Specimen Description BLOOD RIGHT ANTECUBITAL  Final   Special Requests BOTTLES DRAWN AEROBIC AND ANAEROBIC  Final   Culture NO GROWTH 3 DAYS  Final   Report Status PENDING  Incomplete  Blood culture (routine x 2)     Status: None (Preliminary result)   Collection Time: 03/25/16 10:45 PM  Result Value Ref Range Status   Specimen Description BLOOD RIGHT HAND  Final   Special Requests BOTTLES DRAWN AEROBIC AND ANAEROBIC  Final   Culture NO GROWTH 3 DAYS  Final   Report Status PENDING  Incomplete      Scheduled Meds: . diazepam  2 mg Oral Q12H  . enoxaparin (LOVENOX) injection  40 mg Subcutaneous Q24H  . potassium chloride  20 mEq Oral Once  . sodium chloride flush  3 mL Intravenous Q12H    Assessment/Plan:  1. Overdose with multiple medications. 70 tablets of gabapentin 30 tablets of Adderall and also amitriptyline. Patient now alert and awake. I'm awaiting psychiatry evaluation. Hopefully can be transferred downstairs to the psychiatry floor. 2. Generalized abdominal pain. Resolved 3. Leukocytosis- normalized 4. Hypokalemia- replaced 5. Patient was placed on standing dose IV Valium. Will switch to oral and taper this down to 2 mg every 12 hours.  Code Status:     Code Status Orders        Start     Ordered   03/25/16 2054  Full code   Continuous     03/25/16 2102    Code Status History    Date Active Date Inactive Code Status Order ID Comments User Context   11/18/2015  4:59 PM 11/20/2015  7:45 PM Full Code [id number]  Currie Paris, RN Inpatient   11/17/2015  6:50 PM 11/18/2015  4:59 PM Full Code [id number]  Beau Fanny, MD Inpatient   11/13/2015  6:04 AM 11/17/2015  6:50 PM Full Code [id number]  Arnaldo Natal, MD Inpatient   10/07/2015  6:33 PM 10/12/2015  8:30 PM Full Code [id number]  Jimmy Footman, MD  Inpatient   07/12/2015  2:38 PM 07/16/2015  5:54 PM Full Code [id number]  Charm Rings, NP Inpatient   07/10/2015  7:17 PM 07/12/2015  2:38 PM Full Code [id number]  Azalia Bilis, MD ED   06/25/2015  6:24 AM 06/26/2015  3:10 PM Full Code [id number]  Paula Libra, MD ED   03/17/2014 11:15 AM 03/22/2014 10:25 AM Full Code [id number]  Thresa Ross, MD Inpatient  03/17/2014 11:00 AM 03/17/2014 11:15 AM Full Code [id number]  Nanine Means, NP Inpatient   03/15/2014 10:54 PM 03/17/2014 11:00 AM Full Code [id number]  Warnell Forester, MD ED   02/10/2014  3:11 PM 02/12/2014  6:28 PM Full Code [id number]  Trevor Mace, PA-C ED     Disposition Plan: Hopefully to psychiatry for  Consultants:  Awaiting psychiatry consultation  Time spent: 30 minutes  Alford Highland  [facility]

## 2016-03-29 MED ORDER — PREDNISONE 5 MG PO TABS: 5.0000 mg | ORAL_TABLET | Freq: Every day | ORAL | Status: DC

## 2016-03-29 MED ORDER — PREDNISONE 10 MG PO TABS
5.0000 mg | ORAL_TABLET | Freq: Every day | ORAL | Status: DC
  Administered 2016-03-29 – 2016-03-30 (×2): 5 mg via ORAL
  Filled 2016-03-29 (×2): qty 1

## 2016-03-29 MED ORDER — ZOLPIDEM TARTRATE 5 MG PO TABS
5.0000 mg | ORAL_TABLET | Freq: Once | ORAL | Status: AC
  Administered 2016-03-29: 23:00:00 5 mg via ORAL
  Filled 2016-03-29: qty 1

## 2016-03-29 MED ORDER — TRIAMCINOLONE ACETONIDE 0.1 % EX CREA
TOPICAL_CREAM | Freq: Two times a day (BID) | CUTANEOUS | Status: DC
  Administered 2016-03-29 – 2016-03-30 (×3): via TOPICAL
  Filled 2016-03-29: qty 15

## 2016-03-29 MED ORDER — TRIAMCINOLONE ACETONIDE 0.1 % EX CREA: TOPICAL_CREAM | Freq: Two times a day (BID) | CUTANEOUS | Status: DC

## 2016-03-29 MED ORDER — ZOLPIDEM TARTRATE 5 MG PO TABS
5.0000 mg | ORAL_TABLET | Freq: Every evening | ORAL | Status: DC | PRN
  Administered 2016-03-29: 21:00:00 5 mg via ORAL
  Filled 2016-03-29: qty 1

## 2016-03-29 NOTE — Discharge Summary (Addendum)
[facility] - [place] at [facility]   PATIENT NAME: Curtis Cobb    MR#:  [id number]  DATE OF BIRTH:  1971-01-24  DATE OF ADMISSION:  03/25/2016 ADMITTING PHYSICIAN: Crissie Figures, MD  DATE OF DISCHARGE: 03/30/2016  PRIMARY CARE PHYSICIAN: No primary care provider on file.    ADMISSION DIAGNOSIS:  Left flank pain [R10.9] Schizophrenia, unspecified type (HCC) [F20.9] Multiple drug overdose, intentional self-harm, initial encounter (HCC) [T50.902A] Psychosis, unspecified psychosis type [F29]  DISCHARGE DIAGNOSIS:  Principal Problem:   OD (overdose of drug) Active Problems:   Severe episode of recurrent major depressive disorder, without psychotic features (HCC)   Abdominal pain   Leucocytosis   Hypokalemia   Drug overdose, multiple drugs   Overdose   SECONDARY DIAGNOSIS:   Past Medical History  Diagnosis Date  . Bipolar 1 disorder (HCC)   . Schizophrenia (HCC)   . Depression   . Anxiety   . Rheumatoid arthritis (HCC)   . Hypertension   . Schizoaffective disorder, depressive type (HCC) 10/06/2015  . Alcohol use disorder, moderate, dependence (HCC) 02/13/2014    HOSPITAL COURSE:   1. Overdose with multiple medications. 70 tablets of gabapentin, 30 tablets of Adderall and also amitriptyline. Patient is now alert and awake and stable for transfer to the psychiatry floor. Appreciate psychiatric  consultation. Patient on suicide precautions up on the medical floor. Patient and this really was placed on Precedex drip and given IV medications for sedation. Patient asking for ativan to calm down today. One dose given 2. Patient states that he has a history of rheumatoid arthritis and is on 20 mg of prednisone daily. Patient may have also gone through withdrawal with this. I will send off a rheumatoid factor. Started prednisone 5 mg daily. Patients joints much better today 3. Leukocytosis. This has normalized 4. Hypokalemia. Replaced 5. Rash on right arm  secondary to blood pressure cuff in the ICU. Triamcinolone cream.  DISCHARGE CONDITIONS:   Satisfactory  CONSULTS OBTAINED:  Treatment Team:  Auburn Bilberry, MD Gena Fray, MD  DRUG ALLERGIES:   Allergies  Allergen Reactions  . Sulfa Antibiotics Other (See Comments)    Unknown reaction    DISCHARGE MEDICATIONS:   Current Discharge Medication List    START taking these medications   Details  predniSONE (DELTASONE) 5 MG tablet Take 1 tablet (5 mg total) by mouth daily with breakfast.    triamcinolone cream (KENALOG) 0.1 % Apply topically 2 (two) times daily. Qty: 30 g, Refills: 0         DISCHARGE INSTRUCTIONS:   Follow-up on psychiatry for 1 day  If you experience worsening of your admission symptoms, develop shortness of breath, life threatening emergency, suicidal or homicidal thoughts you must seek medical attention immediately by calling 911 or calling your MD immediately  if symptoms less severe.  You Must read complete instructions/literature along with all the possible adverse reactions/side effects for all the Medicines you take and that have been prescribed to you. Take any new Medicines after you have completely understood and accept all the possible adverse reactions/side effects.   Please note  You were cared for by a hospitalist during your hospital stay. If you have any questions about your discharge medications or the care you received while you were in the hospital after you are discharged, you can call the unit and asked to speak with the hospitalist on call if the hospitalist that took care of you is not available. Once you are  discharged, your primary care physician will handle any further medical issues. Please note that NO REFILLS for any discharge medications will be authorized once you are discharged, as it is imperative that you return to your primary care physician (or establish a relationship with a primary care physician if you do not have  one) for your aftercare needs so that they can reassess your need for medications and monitor your lab values.    Today   CHIEF COMPLAINT:   Chief Complaint  Patient presents with  . Withdrawal    HISTORY OF PRESENT ILLNESS:  Curtis Cobb  is a 45 y.o. male presented with an overdose   VITAL SIGNS:  Blood pressure 129/64, pulse 73, temperature 98.1 F (36.7 C), temperature source Oral, resp. rate 18, height 5\' 11"  (1.803 m), weight 89.812 kg (198 lb), SpO2 97 %.    PHYSICAL EXAMINATION:  GENERAL:  45 y.o.-year-old patient lying in the bed with no acute distress.  EYES: Pupils equal, round, reactive to light and accommodation. No scleral icterus. Extraocular muscles intact.  HEENT: Head atraumatic, normocephalic. Oropharynx and nasopharynx clear.  NECK:  Supple, no jugular venous distention. No thyroid enlargement, no tenderness.  LUNGS: Normal breath sounds bilaterally, no wheezing, rales,rhonchi or crepitation. No use of accessory muscles of respiration.  CARDIOVASCULAR: S1, S2 normal. No murmurs, rubs, or gallops.  ABDOMEN: Soft, non-tender, non-distended. Bowel sounds present. No organomegaly or mass.  EXTREMITIES: No pedal edema, cyanosis, or clubbing.  NEUROLOGIC: Cranial nerves II through XII are intact. Muscle strength 5/5 in all extremities. Sensation intact. Gait not checked.  PSYCHIATRIC: The patient is alert and oriented x 3.  SKIN: Rash right arm maculopapular  DATA REVIEW:   CBC  Recent Labs Lab 03/26/16 0403  WBC 8.9  HGB 12.3*  HCT 35.7*  PLT 258    Chemistries   Recent Labs Lab 03/25/16 1436  03/27/16 0550  NA 133*  < > 138  K 3.2*  < > 3.5  CL 100*  < > 110  CO2 22  < > 24  GLUCOSE 129*  < > 91  BUN 17  < > 12  CREATININE 1.11  < > 0.82  CALCIUM 9.2  < > 7.8*  AST 59*  --   --   ALT 75*  --   --   ALKPHOS 57  --   --   BILITOT 0.9  --   --   < > = values in this interval not displayed.   Microbiology Results  Results for orders  placed or performed during the hospital encounter of 03/25/16  Urine culture     Status: None   Collection Time: 03/25/16  8:45 PM  Result Value Ref Range Status   Specimen Description URINE, RANDOM  Final   Special Requests NONE  Final   Culture NO GROWTH 2 DAYS  Final   Report Status 03/27/2016 FINAL  Final  MRSA PCR Screening     Status: None   Collection Time: 03/25/16 10:32 PM  Result Value Ref Range Status   MRSA by PCR NEGATIVE NEGATIVE Final    Comment:        The GeneXpert MRSA Assay (FDA approved for NASAL specimens only), is one component of a comprehensive MRSA colonization surveillance program. It is not intended to diagnose MRSA infection nor to guide or monitor treatment for MRSA infections.   Blood culture (routine x 2)     Status: None (Preliminary result)   Collection Time: 03/25/16 10:41  PM  Result Value Ref Range Status   Specimen Description BLOOD RIGHT ANTECUBITAL  Final   Special Requests BOTTLES DRAWN AEROBIC AND ANAEROBIC  Final   Culture NO GROWTH 4 DAYS  Final   Report Status PENDING  Incomplete  Blood culture (routine x 2)     Status: None (Preliminary result)   Collection Time: 03/25/16 10:45 PM  Result Value Ref Range Status   Specimen Description BLOOD RIGHT HAND  Final   Special Requests BOTTLES DRAWN AEROBIC AND ANAEROBIC  Final   Culture NO GROWTH 4 DAYS  Final   Report Status PENDING  Incomplete     Management plans discussed with the patient and he is in agreement.  CODE STATUS:     Code Status Orders        Start     Ordered   03/25/16 2054  Full code   Continuous     03/25/16 2102    Code Status History    Date Active Date Inactive Code Status Order ID Comments User Context   11/18/2015  4:59 PM 11/20/2015  7:45 PM Full Code [id number]  Currie Paris, RN Inpatient   11/17/2015  6:50 PM 11/18/2015  4:59 PM Full Code [id number]  Beau Fanny, MD Inpatient   11/13/2015  6:04 AM 11/17/2015  6:50 PM Full Code [id number]   Arnaldo Natal, MD Inpatient   10/07/2015  6:33 PM 10/12/2015  8:30 PM Full Code [id number]  Jimmy Footman, MD Inpatient   07/12/2015  2:38 PM 07/16/2015  5:54 PM Full Code [id number]  Charm Rings, NP Inpatient   07/10/2015  7:17 PM 07/12/2015  2:38 PM Full Code [id number]  Azalia Bilis, MD ED   06/25/2015  6:24 AM 06/26/2015  3:10 PM Full Code [id number]  Paula Libra, MD ED   03/17/2014 11:15 AM 03/22/2014 10:25 AM Full Code [id number]  Thresa Ross, MD Inpatient   03/17/2014 11:00 AM 03/17/2014 11:15 AM Full Code [id number]  Nanine Means, NP Inpatient   03/15/2014 10:54 PM 03/17/2014 11:00 AM Full Code [id number]  Warnell Forester, MD ED   02/10/2014  3:11 PM 02/12/2014  6:28 PM Full Code [id number]  Trevor Mace, PA-C ED      TOTAL TIME TAKING CARE OF THIS PATIENT: 32 minutes.    Alford Highland M.D on 03/29/2016 at 8:15 AM. Edited 03/30/2016 at 8:20 am  Between 7am to 6pm - Pager - [phone]  After 6pm go to [email or web address] - password [email or web address] [facility]  [facility]  Office  [phone]  CC: Primary care physician; No primary care provider on file.

## 2016-03-29 NOTE — Progress Notes (Signed)
Patient ID: Curtis Cobb, male   DOB: August 30, 1971, 45 y.o.   MRN: [id number] [facility] PROGRESS NOTE  Curtis Cobb:[id number] DOB: [birth year] DOA: 03/25/2016 PCP: No primary care provider on file.  HPI/Subjective: Patient states that his joints are stiff. He has a history of rheumatoid arthritis and takes 20 mg of prednisone at home. Right knee, bilateral hips and wrists are the ones that are affected  Objective: Filed Vitals:   03/28/16 2112 03/29/16 0508  BP: 141/85 129/64  Pulse: 79 73  Temp: 98.2 F (36.8 C) 98.1 F (36.7 C)  Resp: 18 18    Filed Weights   03/27/16 0410 03/28/16 0451 03/29/16 0500  Weight: 95.2 kg (209 lb 14.1 oz) 91.853 kg (202 lb 8 oz) 89.812 kg (198 lb)    ROS: Review of Systems  Constitutional: Negative for fever and chills.  Eyes: Negative for blurred vision.  Respiratory: Negative for cough and shortness of breath.   Cardiovascular: Negative for chest pain.  Gastrointestinal: Negative for nausea, vomiting, abdominal pain, diarrhea and constipation.  Genitourinary: Negative for dysuria.  Musculoskeletal: Positive for joint pain.  Neurological: Negative for dizziness and headaches.   Exam: Physical Exam  Constitutional: He is oriented to person, place, and time.  HENT:  Nose: No mucosal edema.  Mouth/Throat: No oropharyngeal exudate or posterior oropharyngeal edema.  Eyes: Conjunctivae, EOM and lids are normal. Pupils are equal, round, and reactive to light.  Neck: No JVD present. Carotid bruit is not present. No edema present. No thyroid mass and no thyromegaly present.  Cardiovascular: S1 normal and S2 normal.  Exam reveals no gallop.   No murmur heard. Pulses:      Dorsalis pedis pulses are 2+ on the right side, and 2+ on the left side.  Respiratory: No respiratory distress. He has no wheezes. He has no rhonchi. He has no rales.  GI: Soft. Bowel sounds are normal. There is no tenderness.  Musculoskeletal:       Right ankle:  He exhibits no swelling.       Left ankle: He exhibits no swelling.  Good range of motion right knee and bilateral wrists.  Lymphadenopathy:    He has no cervical adenopathy.  Neurological: He is alert and oriented to person, place, and time. No cranial nerve deficit.  Skin: Skin is warm. No rash noted. Nails show no clubbing.  Psychiatric: His affect is blunt. He exhibits a depressed mood.      Data Reviewed: Basic Metabolic Panel:  Recent Labs Lab 03/25/16 1436 03/26/16 0403 03/27/16 0550  NA 133* 134* 138  K 3.2* 3.8 3.5  CL 100* 106 110  CO2 22 21* 24  GLUCOSE 129* 132* 91  BUN 17 12 12   CREATININE 1.11 0.77 0.82  CALCIUM 9.2 8.1* 7.8*   Liver Function Tests:  Recent Labs Lab 03/25/16 1436  AST 59*  ALT 75*  ALKPHOS 57  BILITOT 0.9  PROT 8.2*  ALBUMIN 4.7   CBC:  Recent Labs Lab 03/25/16 1436 03/26/16 0403  WBC 16.9* 8.9  HGB 14.2 12.3*  HCT 42.2 35.7*  MCV 86.8 88.2  PLT 333 258    CBG:  Recent Labs Lab 03/25/16 2230  GLUCAP 112*    Recent Results (from the past 240 hour(s))  Urine culture     Status: None   Collection Time: 03/25/16  8:45 PM  Result Value Ref Range Status   Specimen Description URINE, RANDOM  Final   Special Requests NONE  Final   Culture NO GROWTH 2 DAYS  Final   Report Status 03/27/2016 FINAL  Final  MRSA PCR Screening     Status: None   Collection Time: 03/25/16 10:32 PM  Result Value Ref Range Status   MRSA by PCR NEGATIVE NEGATIVE Final    Comment:        The GeneXpert MRSA Assay (FDA approved for NASAL specimens only), is one component of a comprehensive MRSA colonization surveillance program. It is not intended to diagnose MRSA infection nor to guide or monitor treatment for MRSA infections.   Blood culture (routine x 2)     Status: None (Preliminary result)   Collection Time: 03/25/16 10:41 PM  Result Value Ref Range Status   Specimen Description BLOOD RIGHT ANTECUBITAL  Final   Special Requests  BOTTLES DRAWN AEROBIC AND ANAEROBIC  Final   Culture NO GROWTH 4 DAYS  Final   Report Status PENDING  Incomplete  Blood culture (routine x 2)     Status: None (Preliminary result)   Collection Time: 03/25/16 10:45 PM  Result Value Ref Range Status   Specimen Description BLOOD RIGHT HAND  Final   Special Requests BOTTLES DRAWN AEROBIC AND ANAEROBIC  Final   Culture NO GROWTH 4 DAYS  Final   Report Status PENDING  Incomplete      Scheduled Meds: . enoxaparin (LOVENOX) injection  40 mg Subcutaneous Q24H  . predniSONE  5 mg Oral Q breakfast  . sodium chloride flush  3 mL Intravenous Q12H  . triamcinolone cream   Topical BID    Assessment/Plan:  1. Overdose with multiple medications. 70 tablets of gabapentin 30 tablets of Adderall and also amitriptyline. Patient stable for discharge to psychiatry floor. Still awaiting transfer at this point 2. Generalized abdominal pain. Resolved 3. Leukocytosis- normalized 4. Hypokalemia- replaced 5. Taper Valium to off 6. Rheumatoid arthritis. Start low-dose prednisone 5 mg. We'll need to see a rheumatologist as outpatient. Send off rheumatoid factor 7. Rash right arm where her blood pressure cuff was. Triamcinolone cream  Code Status:     Code Status Orders        Start     Ordered   03/25/16 2054  Full code   Continuous     03/25/16 2102    Code Status History    Date Active Date Inactive Code Status Order ID Comments User Context   11/18/2015  4:59 PM 11/20/2015  7:45 PM Full Code [id number]  Currie Paris, RN Inpatient   11/17/2015  6:50 PM 11/18/2015  4:59 PM Full Code [id number]  Beau Fanny, MD Inpatient   11/13/2015  6:04 AM 11/17/2015  6:50 PM Full Code [id number]  Arnaldo Natal, MD Inpatient   10/07/2015  6:33 PM 10/12/2015  8:30 PM Full Code [id number]  Jimmy Footman, MD Inpatient   07/12/2015  2:38 PM 07/16/2015  5:54 PM Full Code [id number]  Charm Rings, NP Inpatient   07/10/2015  7:17 PM 07/12/2015   2:38 PM Full Code [id number]  Azalia Bilis, MD ED   06/25/2015  6:24 AM 06/26/2015  3:10 PM Full Code [id number]  Paula Libra, MD ED   03/17/2014 11:15 AM 03/22/2014 10:25 AM Full Code [id number]  Thresa Ross, MD Inpatient   03/17/2014 11:00 AM 03/17/2014 11:15 AM Full Code [id number]  Nanine Means, NP Inpatient   03/15/2014 10:54 PM 03/17/2014 11:00 AM Full Code [id number]  Warnell Forester, MD ED   02/10/2014  3:11 PM 02/12/2014  6:28 PM  Full Code [id number]  Trevor Mace, PA-C ED     Disposition Plan: Hopefully to psychiatry floor soon  Consultants:  Psychiatry consultation  Time spent: 35 minutes  Alford Highland  [facility]

## 2016-03-30 ENCOUNTER — Inpatient Hospital Stay
Admission: EM | Admit: 2016-03-30 | Discharge: 2016-03-31 | DRG: 897 | Disposition: A | Discharge status: Home / Self Care | Payer: No Typology Code available for payment source | Source: Intra-hospital | Attending: Psychiatry | Admitting: Psychiatry

## 2016-03-30 ENCOUNTER — Encounter: Payer: Self-pay | Admitting: Psychiatry

## 2016-03-30 DIAGNOSIS — F1320 Sedative, hypnotic or anxiolytic dependence, uncomplicated: Secondary | ICD-10-CM | POA: Diagnosis present

## 2016-03-30 DIAGNOSIS — F1520 Other stimulant dependence, uncomplicated: Secondary | ICD-10-CM | POA: Diagnosis present

## 2016-03-30 DIAGNOSIS — Z882 Allergy status to sulfonamides status: Secondary | ICD-10-CM | POA: Diagnosis not present

## 2016-03-30 DIAGNOSIS — F19959 Other psychoactive substance use, unspecified with psychoactive substance-induced psychotic disorder, unspecified: Principal | ICD-10-CM | POA: Diagnosis present

## 2016-03-30 DIAGNOSIS — F1020 Alcohol dependence, uncomplicated: Secondary | ICD-10-CM | POA: Diagnosis present

## 2016-03-30 DIAGNOSIS — F19950 Other psychoactive substance use, unspecified with psychoactive substance-induced psychotic disorder with delusions: Secondary | ICD-10-CM

## 2016-03-30 DIAGNOSIS — I10 Essential (primary) hypertension: Secondary | ICD-10-CM | POA: Diagnosis present

## 2016-03-30 DIAGNOSIS — G4700 Insomnia, unspecified: Secondary | ICD-10-CM | POA: Diagnosis present

## 2016-03-30 DIAGNOSIS — F22 Delusional disorders: Secondary | ICD-10-CM | POA: Diagnosis present

## 2016-03-30 DIAGNOSIS — Z87891 Personal history of nicotine dependence: Secondary | ICD-10-CM | POA: Diagnosis not present

## 2016-03-30 DIAGNOSIS — R45851 Suicidal ideations: Secondary | ICD-10-CM | POA: Diagnosis present

## 2016-03-30 DIAGNOSIS — M069 Rheumatoid arthritis, unspecified: Secondary | ICD-10-CM | POA: Diagnosis present

## 2016-03-30 DIAGNOSIS — F064 Anxiety disorder due to known physiological condition: Secondary | ICD-10-CM | POA: Diagnosis present

## 2016-03-30 DIAGNOSIS — Z915 Personal history of self-harm: Secondary | ICD-10-CM

## 2016-03-30 DIAGNOSIS — F1220 Cannabis dependence, uncomplicated: Secondary | ICD-10-CM | POA: Diagnosis present

## 2016-03-30 LAB — RHEUMATOID FACTOR: Rhuematoid fact SerPl-aCnc: <10 IU/mL (ref 0.0–13.9)

## 2016-03-30 MED ORDER — ACETAMINOPHEN 325 MG PO TABS
650.0000 mg | ORAL_TABLET | Freq: Four times a day (QID) | ORAL | Status: DC | PRN
  Administered 2016-03-31: 650 mg via ORAL
  Filled 2016-03-30: qty 2

## 2016-03-30 MED ORDER — LORAZEPAM 2 MG PO TABS
2.0000 mg | ORAL_TABLET | Freq: Once | ORAL | Status: AC
Start: 2016-03-30 — End: 2016-03-30
  Administered 2016-03-30: 13:00:00 2 mg via ORAL
  Filled 2016-03-30: qty 1

## 2016-03-30 MED ORDER — MAGNESIUM HYDROXIDE 400 MG/5ML PO SUSP: 30.0000 mL | Freq: Every day | ORAL | Status: DC | PRN

## 2016-03-30 MED ORDER — ALUM & MAG HYDROXIDE-SIMETH 200-200-20 MG/5ML PO SUSP: 30.0000 mL | ORAL | Status: DC | PRN

## 2016-03-30 MED ORDER — DIPHENHYDRAMINE HCL 25 MG PO CAPS
50.0000 mg | ORAL_CAPSULE | Freq: Every evening | ORAL | Status: DC | PRN
  Administered 2016-03-31: 50 mg via ORAL
  Filled 2016-03-30: qty 2

## 2016-03-30 MED ORDER — ZOLPIDEM TARTRATE 5 MG PO TABS: 5.0000 mg | ORAL_TABLET | Freq: Every evening | ORAL | Status: DC | PRN

## 2016-03-30 MED ORDER — ZOLPIDEM TARTRATE 5 MG PO TABS
10.0000 mg | ORAL_TABLET | Freq: Every day | ORAL | Status: DC
  Administered 2016-03-30: 10 mg via ORAL
  Filled 2016-03-30: qty 2

## 2016-03-30 NOTE — Progress Notes (Signed)
Patient ID: Curtis Cobb, male   DOB: [birth year], 45 y.o.   MRN: [id number] [facility] PROGRESS NOTE  HERVE HAUG OIN:[id number] DOB: [birth year] DOA: 03/25/2016 PCP: No primary care provider on file.  HPI/Subjective: Patient states his joints are feeling better. He was surprised that just 5 mg of prednisone would do that. He still very frustrated and can't sleep here. He asked me for Ativan 4 mg this morning to rest.  Objective: Filed Vitals:   03/30/16 0533 03/30/16 1249  BP: 151/85 143/88  Pulse: 77 94  Temp: 97.9 F (36.6 C) 98.5 F (36.9 C)  Resp: 18 20    Filed Weights   03/28/16 0451 03/29/16 0500 03/30/16 0500  Weight: 91.853 kg (202 lb 8 oz) 89.812 kg (198 lb) 89.313 kg (196 lb 14.4 oz)    ROS: Review of Systems  Constitutional: Negative for fever and chills.  Eyes: Negative for blurred vision.  Respiratory: Negative for cough and shortness of breath.   Cardiovascular: Negative for chest pain.  Gastrointestinal: Negative for nausea, vomiting, abdominal pain, diarrhea and constipation.  Genitourinary: Negative for dysuria.  Musculoskeletal: Positive for joint pain.  Neurological: Negative for dizziness and headaches.   Exam: Physical Exam  Constitutional: He is oriented to person, place, and time.  HENT:  Nose: No mucosal edema.  Mouth/Throat: No oropharyngeal exudate or posterior oropharyngeal edema.  Eyes: Conjunctivae, EOM and lids are normal. Pupils are equal, round, and reactive to light.  Neck: No JVD present. Carotid bruit is not present. No edema present. No thyroid mass and no thyromegaly present.  Cardiovascular: S1 normal and S2 normal.  Exam reveals no gallop.   No murmur heard. Pulses:      Dorsalis pedis pulses are 2+ on the right side, and 2+ on the left side.  Respiratory: No respiratory distress. He has no wheezes. He has no rhonchi. He has no rales.  GI: Soft. Bowel sounds are normal. There is no tenderness.  Musculoskeletal:     Right ankle: He exhibits no swelling.       Left ankle: He exhibits no swelling.  Good range of motion right knee and bilateral wrists.  Lymphadenopathy:    He has no cervical adenopathy.  Neurological: He is alert and oriented to person, place, and time. No cranial nerve deficit.  Skin: Skin is warm. No rash noted. Nails show no clubbing.  Psychiatric: His affect is blunt. He exhibits a depressed mood.      Data Reviewed: Basic Metabolic Panel:  Recent Labs Lab 03/25/16 1436 03/26/16 0403 03/27/16 0550  NA 133* 134* 138  K 3.2* 3.8 3.5  CL 100* 106 110  CO2 22 21* 24  GLUCOSE 129* 132* 91  BUN 17 12 12   CREATININE 1.11 0.77 0.82  CALCIUM 9.2 8.1* 7.8*   Liver Function Tests:  Recent Labs Lab 03/25/16 1436  AST 59*  ALT 75*  ALKPHOS 57  BILITOT 0.9  PROT 8.2*  ALBUMIN 4.7   CBC:  Recent Labs Lab 03/25/16 1436 03/26/16 0403  WBC 16.9* 8.9  HGB 14.2 12.3*  HCT 42.2 35.7*  MCV 86.8 88.2  PLT 333 258    CBG:  Recent Labs Lab 03/25/16 2230  GLUCAP 112*    Recent Results (from the past 240 hour(s))  Urine culture     Status: None   Collection Time: 03/25/16  8:45 PM  Result Value Ref Range Status   Specimen Description URINE, RANDOM  Final   Special Requests NONE  Final   Culture NO GROWTH 2 DAYS  Final   Report Status 03/27/2016 FINAL  Final  MRSA PCR Screening     Status: None   Collection Time: 03/25/16 10:32 PM  Result Value Ref Range Status   MRSA by PCR NEGATIVE NEGATIVE Final    Comment:        The GeneXpert MRSA Assay (FDA approved for NASAL specimens only), is one component of a comprehensive MRSA colonization surveillance program. It is not intended to diagnose MRSA infection nor to guide or monitor treatment for MRSA infections.   Blood culture (routine x 2)     Status: None (Preliminary result)   Collection Time: 03/25/16 10:41 PM  Result Value Ref Range Status   Specimen Description BLOOD RIGHT ANTECUBITAL  Final    Special Requests BOTTLES DRAWN AEROBIC AND ANAEROBIC  Final   Culture NO GROWTH 4 DAYS  Final   Report Status PENDING  Incomplete  Blood culture (routine x 2)     Status: None (Preliminary result)   Collection Time: 03/25/16 10:45 PM  Result Value Ref Range Status   Specimen Description BLOOD RIGHT HAND  Final   Special Requests BOTTLES DRAWN AEROBIC AND ANAEROBIC  Final   Culture NO GROWTH 4 DAYS  Final   Report Status PENDING  Incomplete      Scheduled Meds: . enoxaparin (LOVENOX) injection  40 mg Subcutaneous Q24H  . predniSONE  5 mg Oral Q breakfast  . sodium chloride flush  3 mL Intravenous Q12H  . triamcinolone cream   Topical BID    Assessment/Plan:  1. Overdose with multiple medications. 70 tablets of gabapentin 30 tablets of Adderall and also amitriptyline. Patient stable for discharge to psychiatry floor. Still awaiting transfer to psychiatry floor. Patient is medically stable for 3 days. 2. Generalized abdominal pain. Resolved 3. Leukocytosis- normalized 4. Hypokalemia- replaced 5. Taper Valium to off 6. Rheumatoid arthritis. Rheumatoid factor less than 10. On low-dose prednisone 5 mg. We'll need to see a rheumatologist as outpatient.  7. Rash right arm where her blood pressure cuff was. Triamcinolone cream  Code Status:     Code Status Orders        Start     Ordered   03/25/16 2054  Full code   Continuous     03/25/16 2102    Code Status History    Date Active Date Inactive Code Status Order ID Comments User Context   11/18/2015  4:59 PM 11/20/2015  7:45 PM Full Code [id number]  Currie Paris, RN Inpatient   11/17/2015  6:50 PM 11/18/2015  4:59 PM Full Code [id number]  Beau Fanny, MD Inpatient   11/13/2015  6:04 AM 11/17/2015  6:50 PM Full Code [id number]  Arnaldo Natal, MD Inpatient   10/07/2015  6:33 PM 10/12/2015  8:30 PM Full Code [id number]  Jimmy Footman, MD Inpatient   07/12/2015  2:38 PM 07/16/2015  5:54 PM Full Code [id number]   Charm Rings, NP Inpatient   07/10/2015  7:17 PM 07/12/2015  2:38 PM Full Code [id number]  Azalia Bilis, MD ED   06/25/2015  6:24 AM 06/26/2015  3:10 PM Full Code [id number]  Paula Libra, MD ED   03/17/2014 11:15 AM 03/22/2014 10:25 AM Full Code [id number]  Thresa Ross, MD Inpatient   03/17/2014 11:00 AM 03/17/2014 11:15 AM Full Code [id number]  Nanine Means, NP Inpatient   03/15/2014 10:54 PM 03/17/2014 11:00 AM Full Code [id number]  Warnell Forester, MD ED  02/10/2014  3:11 PM 02/12/2014  6:28 PM Full Code [id number]  Trevor Mace, PA-C ED     Disposition Plan: Hopefully to psychiatry floor soon  Consultants:  Psychiatry consultation  Time spent: 30 minutes  Alford Highland  [facility]

## 2016-03-30 NOTE — Clinical Social Work Note (Signed)
Clinical Social Worker was consulted for "Behavioral health issues impacting hospitalization/discharge". Pt is under IVC and has a 1:1 sitter. Psych has seen pt and is recommending inpatient psych. Pt has a discharge order for inpatient psych. CSW will continue to follow.   Dede Query, MSW, LCSW  Clinical Social Worker  [phone]

## 2016-03-30 NOTE — Tx Team (Signed)
Initial Interdisciplinary Treatment Plan   PATIENT STRESSORS: Health problems Medication change or noncompliance Substance abuse   PATIENT STRENGTHS: Barrister's clerk for treatment/growth   PROBLEM LIST: Problem List/Patient Goals Date to be addressed Date deferred Reason deferred Estimated date of resolution  Insomnia      Agitation      Suicide Risk                                           DISCHARGE CRITERIA:  Ability to meet basic life and health needs Adequate post-discharge living arrangements Improved stabilization in mood, thinking, and/or behavior Need for constant or close observation no longer present Verbal commitment to aftercare and medication compliance  PRELIMINARY DISCHARGE PLAN: Attend aftercare/continuing care group Attend 12-step recovery group Outpatient therapy Participate in family therapy  PATIENT/FAMIILY INVOLVEMENT: This treatment plan has been presented to and reviewed with the patient, Steward Drone.  The patient and family have been given the opportunity to ask questions and make suggestions.  Lauris Poag 03/30/2016, 6:16 PM

## 2016-03-30 NOTE — BHH Suicide Risk Assessment (Signed)
[facility] Admission Suicide Risk Assessment   Nursing information obtained from:    Demographic factors:    Current Mental Status:    Loss Factors:    Historical Factors:    Risk Reduction Factors:     Total Time spent with patient: 1 hour Principal Problem: Substance-induced psychotic disorder with delusions (HCC) Diagnosis:   Patient Active Problem List   Diagnosis Date Noted  . Sedative, hypnotic or anxiolytic use disorder, severe, dependence (HCC) [F13.20] 03/30/2016  . Drug overdose, multiple drugs [T50.901A] 03/25/2016  . Opioid use disorder, mild, in sustained remission [F11.90] 10/08/2015  . 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 Alcohol use disorder, severe, dependence (HCC) [F10.20] 10/07/2015  . Cannabis use disorder, severe, dependence (HCC) [F12.20] 10/07/2015  . 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 HTN (hypertension) [I10] 10/07/2015  . Rheumatoid arthritis (HCC) [M06.9] 10/07/2015   Subjective Data: Substance abuse.  Continued Clinical Symptoms:    The "Alcohol Use Disorders Identification Test", Guidelines for Use in Primary Care, Second Edition.  World Science writer [facility]). Score between 0-7:  no or low risk or alcohol related problems. Score between 8-15:  moderate risk of alcohol related problems. Score between 16-19:  high risk of alcohol related problems. Score 20 or above:  warrants further diagnostic evaluation for alcohol dependence and treatment.   CLINICAL FACTORS:   Alcohol/Substance Abuse/Dependencies Currently Psychotic   Musculoskeletal: Strength & Muscle Tone: within normal limits Gait & Station: normal Patient leans: N/A  Psychiatric Specialty Exam: Review of Systems  Psychiatric/Behavioral: Positive for substance abuse.  All other systems reviewed and are negative.   Blood pressure 137/100, pulse 112, temperature 98.4 F (36.9 C), temperature source Oral, resp. rate 20,  weight 93.441 kg (206 lb), SpO2 100 %.Body mass index is 28.74 kg/(m^2).  General Appearance: Fairly Groomed  Patent attorney::  Good  Speech:  Clear and Coherent  Volume:  Normal  Mood:  Anxious  Affect:  Blunt  Thought Process:  Goal Directed  Orientation:  Full (Time, Place, and Person)  Thought Content:  WDL  Suicidal Thoughts:  No  Homicidal Thoughts:  No  Memory:  Immediate;   Fair Recent;   Fair Remote;   Fair  Judgement:  Poor  Insight:  Lacking  Psychomotor Activity:  Normal  Concentration:  Fair  Recall:  [employer] of Knowledge:Fair  Language: Fair  Akathisia:  No  Handed:  Right  AIMS (if indicated):     Assets:  Communication Skills Intimacy Physical Health Resilience  Sleep:     Cognition: WNL  ADL's:  Intact    COGNITIVE FEATURES THAT CONTRIBUTE TO RISK:  None    SUICIDE RISK:   Moderate:  Frequent suicidal ideation with limited intensity, and duration, some specificity in terms of plans, no associated intent, good self-control, limited dysphoria/symptomatology, some risk factors present, and identifiable protective factors, including available and accessible social support.  PLAN OF CARE: Hospital admission, medication management, substance abuse counseling, discharge planning.  Mr. Hillery is a 45 year old male with history of substance admitted after presumed intentional overdose on multiple medications including Fioricet and stimulants. He was initially admitted to medical floor and due to agitation ended up in CCU on Precedex.  1. Suicidal ideation. The patient denies any thoughts or ideas or plans to harm hurt himself or others.  2. Mood. He has been maintained on Elavil in the community. We will not restart medications now as he overdosed on it.  3. Insomnia. We'll  give Ambien 10 mg nightly.  4. Substance abuse. The patient has a history of alcoholism and polysubstance dependence. He refused residential treatment.  5. Disposition. He will be  discharged to home with his girlfriend. He will follow up with Dr. Omelia Blackwater.   I certify that inpatient services furnished can reasonably be expected to improve the patient's condition.   Kristine Linea, MD 03/30/2016, 5:54 PM

## 2016-03-30 NOTE — Progress Notes (Signed)
Pt c/o insomnia through the night. Ambien 5 mg given 2049 w/ unchanged effect. Dr. Loney Loh ordered one time extra Ambien 5 mg given again at 2303 w/ unchanged effect.   Dr. Loney Loh notified again at this time that pt still has not been able to sleep, is restless, and pacing in the room. No new orders received. Pt updated and is very upset he cannot get any medication to help him relax or sleep. Emotional support given.

## 2016-03-30 NOTE — H&P (Addendum)
Psychiatric Admission Assessment Adult  Patient Identification: Curtis Cobb MRN:  [id number] Date of Evaluation:  03/30/2016 Chief Complaint:  Overdose Principal Diagnosis: Substance-induced psychotic disorder with delusions (HCC) Diagnosis:   Patient Active Problem List   Diagnosis Date Noted  . Sedative, hypnotic or anxiolytic use disorder, severe, dependence (HCC) [F13.20] 03/30/2016  . Drug overdose, multiple drugs [T50.901A] 03/25/2016  . Opioid use disorder, mild, in sustained remission [F11.90] 10/08/2015  . 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 Alcohol use disorder, severe, dependence (HCC) [F10.20] 10/07/2015  . Cannabis use disorder, severe, dependence (HCC) [F12.20] 10/07/2015  . 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 HTN (hypertension) [I10] 10/07/2015  . Rheumatoid arthritis (HCC) [M06.9] 10/07/2015   History of Present Illness:  Identifying data. [name] is a 45 year old male with a history of substance abuse and mood instability.  Chief complaint. "I haven't slept in 3 days."  History of present illness. Information was obtained from the patient and the chart. [name] has a long history of mood instability and substance use with multiple psychiatric hospitalizations. He was brought to the hospital agitated and psychotic in the context of overdose of multiple medications. This happened over 3 day period. Reportedly the patient took overdose of Adderall, Neurontin, Elavil, and other medications including over-the-counter tianeptine sodium sold as food supplement. He was also positive for barbiturates indicated that he's been taking Fioricet. His primary psychiatrist is Dr. Barnie Mort who prescribes Elavil. During the interview the patient denies symptoms of depression or psychosis. He reports heightened anxiety due to approaching anniversary of his mother's and grandmother's death. He denies heavy drinking recently  but has a history of severe alcohol dependence. He completed alcohol detox on medical floor. To Dr. Synthia Innocent, who saw him on the weekend, he denied depression, suicidal or homicidal ideation.   Past psychiatric history. He was hospitalized several times before for depression, suicide attempts by overdose, and alcoholism. He was twice in long-term rehabilitation and was able to maintain sobriety for a year. He has been tried on multiple medications in Zoloft, Paxil, Wellbutrin, trazodone, Tegretol, Wellbutrin, Neurontin, Elavil. His primary psychiatrist is Dr. Omelia Blackwater. He used to go to [facility] for SA IOP. He does well with AA program. He believes that food supplement that he uses, tianeptine sodium, high doses binds to opioid receptor. He has been abusing it to address arthritic pain. He has been prescribed prednisone that is now lowered to 5 mg daily.  Family psychiatric history. Nonreported.  Social history. He lives in [place] with his girlfriend. 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 His family lives out of state and he has very little support except for a friend here. He is done well going to [employer]. He has no health insurance.  Total Time spent with patient: 1 hour  Past Psychiatric History: Substance abuse and mood instability.  Is the patient at risk to self? Yes.    Has the patient been a risk to self in the past 6 months? Yes.    Has the patient been a risk to self within the distant past? Yes.    Is the patient a risk to others? No.  Has the patient been a risk to others in the past 6 months? No.  Has the patient been a risk to others within the distant past? No.   Prior Inpatient Therapy:   Prior Outpatient Therapy:    Alcohol Screening:   Substance Abuse History in the  last 12 months:  Yes.   Consequences of Substance Abuse: Negative Previous Psychotropic Medications: Yes  Psychological Evaluations: Yes  Past Medical History:  Past Medical History   Diagnosis Date  . Bipolar 1 disorder (HCC)   . Schizophrenia (HCC)   . Depression   . Anxiety   . Rheumatoid arthritis (HCC)   . Hypertension   . Schizoaffective disorder, depressive type (HCC) 10/06/2015  . Alcohol use disorder, moderate, dependence (HCC) 02/13/2014   History reviewed. No pertinent past surgical history. Family History:  Family History  Problem Relation Age of Onset  . Family history unknown: Yes   Family Psychiatric  History: None reported. Tobacco Screening: @FLOW ([phone])::1)@ Social History:  History  Alcohol Use  . Yes    Comment: gallon/day     History  Drug Use  . Yes  . Special: Amphetamines    Comment: THC    Additional Social History:                           Allergies:   Allergies  Allergen Reactions  . Sulfa Antibiotics Other (See Comments)    Unknown reaction   Lab Results: No results found for this or any previous visit (from the past 48 hour(s)).  Blood Alcohol level:  Lab Results  Component Value Date   ETH <5 03/25/2016   ETH <5 11/13/2015    Metabolic Disorder Labs:  Lab Results  Component Value Date   HGBA1C 5.2 11/13/2015   No results found for: PROLACTIN No results found for: CHOL, TRIG, HDL, CHOLHDL, VLDL, LDLCALC  Current Medications: No current facility-administered medications for this encounter.   PTA Medications: Prescriptions prior to admission  Medication Sig Dispense Refill Last Dose  . predniSONE (DELTASONE) 5 MG tablet Take 1 tablet (5 mg total) by mouth daily with breakfast.     . triamcinolone cream (KENALOG) 0.1 % Apply topically 2 (two) times daily. 30 g 0   . zolpidem (AMBIEN) 5 MG tablet Take 1 tablet (5 mg total) by mouth at bedtime as needed for sleep. 30 tablet 0     Musculoskeletal: Strength & Muscle Tone: within normal limits Gait & Station: normal Patient leans: N/A  Psychiatric Specialty Exam: I reviewed physical examination performed on medical floor and agree with the  findings. Physical Exam  Nursing note and vitals reviewed.   Review of Systems  Psychiatric/Behavioral: Positive for substance abuse.  All other systems reviewed and are negative.   Blood pressure 137/100, pulse 112, temperature 98.4 F (36.9 C), temperature source Oral, resp. rate 20, weight 93.441 kg (206 lb), SpO2 100 %.Body mass index is 28.74 kg/(m^2).  See SRA.                                                       Treatment Plan Summary: Daily contact with patient to assess and evaluate symptoms and progress in treatment and Medication management   Mr. Winns is a 45 year old male with history of substance admitted after presumed intentional overdose on multiple medications including Fioricet and stimulants. He was initially admitted to medical floor and due to agitation ended up in CCU on Precedex.  1. Suicidal ideation. The patient denies any thoughts or ideas or plans to harm hurt himself or others.  2. Mood. He has been  maintained on Elavil in the community. We will not restart medications now as he overdosed on it.  3. Insomnia. We'll give Ambien 10 mg nightly.  4. Substance abuse. The patient has a history of alcoholism and polysubstance dependence. He refused residential treatment.  5. Disposition. He will be discharged to home with his girlfriend. He will follow up with Dr. Omelia Blackwater.    Observation Level/Precautions:  15 minute checks  Laboratory:  CBC Chemistry Profile UDS UA  Psychotherapy:    Medications:    Consultations:    Discharge Concerns:    Estimated LOS:  Other:     I certify that inpatient services furnished can reasonably be expected to improve the patient's condition.    Kristine Linea, MD 4/3/20176:00 PM

## 2016-03-30 NOTE — Progress Notes (Signed)
Pt admitted from 1C after apparent SA by OD on 30 Adderall, 70 Gabapentin and an unknown amount of Amitriptaline, pt however denies that this was a suicide attempt and denies that he took that many meds, pt's main complaint is Insomnia, states he has not slept x3 days, MD notified, skin/contraband search done, rash to right upper arm states it is from the blood pressure cuff, no contraband found, oriented to unit and rules, no complaints at this time, pleasant and cooperative throughout the admission process.

## 2016-03-30 NOTE — Progress Notes (Signed)
Patient is to be admitted to [facility] by Dr. Jenne Campus.  Attending Physician will be Dr. Jennet Maduro.   Patient has been assigned to room 303B, by [facility] Charge Nurse Sue Lush.   Intake Paper Work has been signed and placed on patient chart.  ER staff is aware of the admission (Patient Access). Please call report to ext. [phone]   03/30/2016 Cheryl Flash, MS, NCC, LPCA Therapeutic Triage Specialist

## 2016-03-31 LAB — CULTURE, BLOOD (ROUTINE X 2)
CULTURE: NO GROWTH
Culture: NO GROWTH

## 2016-03-31 MED ORDER — AMITRIPTYLINE HCL 100 MG PO TABS: 100.0000 mg | ORAL_TABLET | Freq: Every day | ORAL | Status: DC

## 2016-03-31 NOTE — Progress Notes (Signed)
D:Patient aware of discharge this shift . Patient returning home . Patient received all belonging locked up . Patient denies  Suicidal  And homicidal ideations  .  A: Writer instructed on discharge criteria  .  prescriptions  given to patient . Aware  Of follow up appointment . R: Patient left unit with no questions  Or concerns  With group home  friend.

## 2016-03-31 NOTE — Tx Team (Signed)
Interdisciplinary Treatment Plan Update (Adult)  Date:  03/31/2016 Time Reviewed:  3:03 PM  Progress in Treatment: Attending groups: Yes. Participating in groups:  Yes. Taking medication as prescribed:  Yes. Tolerating medication:  Yes. Family/Significant othe contact made:  Yes, individual(s) contacted:  patient friend that he lives with Patient understands diagnosis:  Yes. Discussing patient identified problems/goals with staff:  Yes. Medical problems stabilized or resolved:  Yes. Denies suicidal/homicidal ideation: Yes. Issues/concerns per patient self-inventory:  Yes. Other:  New problem(s) identified: No, Describe:  none reported  Discharge Plan or Barriers: Patient will stabilize on medication and discharge home with outpatient follow up scheduled for mental health.   Reason for Continuation of Hospitalization: Delusions  Depression Suicidal ideation Withdrawal symptoms  Comments:  Estimated length of stay: 0 days, will discharge today Tuesday 03/31/16  New goal(s):  Review of initial/current patient goals per problem list:   1.  Goal(s): Participate in aftercare plan    Met:  Yes  Target date: by discharge  As evidenced by: patient will participate in aftercare plan AEB aftercare provider and housing plan identified at discharge 03/31/16: Patient will return home and has boarding house and [place] list for [facility]. Patient will follow up with his outpatient mental health provider at discharge. Goal met.   2.  Goal (s): Decrease depression    Met:  Yes  Target date: by discharge  As evidenced by: patient demonstrates decreased symptoms of depression and reports a Depression rating of 3 or less 03/31/16: Patient stable for discharge per MD.   3.  Goal(s): Decrease psychosis    Met:  Yes  Target date: by discharge  As evidenced by: patient demonstrates decreased symptoms of psychosis  03/31/16: Patient stable for discharge per MD.   4.  Goal(s):  Patient will demonstrate decreased signs of withdrawal due to substance abuse   Met:  Yes  Target date: by discharge  As evidenced by: Patient will produce a CIWA/COWS score of 0, have stable vitals signs, and no symptoms of withdrawal  03/31/16: Patient stable for discharge per MD.   Attendees: Patient:  Curtis Cobb 4/4/20173:03 PM  Physician:  Orson Slick, MD 4/4/20173:03 PM  Nursing:   Polly Cobia, RN 4/4/20173:03 PM  Other:  Carmell Austria, LCSW 4/4/20173:03 PM  Other:   4/4/20173:03 PM  Other:   4/4/20173:03 PM  Other:  4/4/20173:03 PM  Other:  4/4/20173:03 PM  Other:  4/4/20173:03 PM  Other:  4/4/20173:03 PM  Other:  4/4/20173:03 PM  Other:  4/4/20173:03 PM  Other:   4/4/20173:03 PM   Scribe for Treatment Team:   Keene Breath, MSW, LCSW  03/31/2016, 3:03 PM

## 2016-03-31 NOTE — BHH Counselor (Signed)
Patient was hospitalized less than 24 hours, no PSA required.

## 2016-03-31 NOTE — B[facility] Suicide Risk Assessment (Signed)
[facility] Discharge Suicide Risk Assessment   Principal Problem: Substance-induced psychotic disorder with delusions [facility]) Discharge Diagnoses:  Patient Active Problem List   Diagnosis Date Noted  . Sedative, hypnotic or anxiolytic use disorder, severe, dependence (HCC) [F13.20] 03/30/2016  . Drug overdose, multiple drugs [T50.901A] 03/25/2016  . Opioid use disorder, mild, in sustained remission [F11.90] 10/08/2015  . 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 Alcohol use disorder, severe, dependence (HCC) [F10.20] 10/07/2015  . Cannabis use disorder, severe, dependence (HCC) [F12.20] 10/07/2015  . 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 HTN (hypertension) [I10] 10/07/2015  . Rheumatoid arthritis (HCC) [M06.9] 10/07/2015    Total Time spent with patient: 30 minutes  Musculoskeletal: Strength & Muscle Tone: within normal limits Gait & Station: normal Patient leans: N/A  Psychiatric Specialty Exam: Review of Systems  Psychiatric/Behavioral: The patient has insomnia.   All other systems reviewed and are negative.   Blood pressure 129/85, pulse 95, temperature 98.4 F (36.9 C), temperature source Oral, resp. rate 20, height 6\' 3"  (1.905 m), weight 93.441 kg (206 lb), SpO2 100 %.Body mass index is 25.75 kg/(m^2).  General Appearance: Fairly Groomed  ::  Good  Speech:  Clear and Coherent409  Volume:  Normal  Mood:  Euthymic  Affect:  Appropriate  Thought Process:  Goal Directed  Orientation:  Full (Time, Place, and Person)  Thought Content:  WDL  Suicidal Thoughts:  No  Homicidal Thoughts:  No  Memory:  Immediate;   Fair Recent;   Fair Remote;   Fair  Judgement:  Impaired  Insight:  Shallow  Psychomotor Activity:  Normal  Concentration:  Fair  Recall:  [phone] of Knowledge:Fair  Language: Fair  Akathisia:  No  Handed:  Right  AIMS (if indicated):     Assets:  Communication Skills Desire for  Improvement Housing Intimacy Resilience Social Support  Sleep:     Cognition: WNL  ADL's:  Intact   Mental Status Per Nursing Assessment::   On Admission:     Demographic Factors:  Male, Caucasian and Unemployed  Loss Factors: Decrease in vocational status, Decline in physical health and Financial problems/change in socioeconomic status  Historical Factors: Prior suicide attempts, Family history of mental illness or substance abuse, Anniversary of important loss and Impulsivity  Risk Reduction Factors:   Sense of responsibility to family, Living with another person, especially a relative, Positive social support and Positive therapeutic relationship  Continued Clinical Symptoms:  Depression:   Comorbid alcohol abuse/dependence Impulsivity Insomnia Alcohol/Substance Abuse/Dependencies  Cognitive Features That Contribute To Risk:  None    Suicide Risk:  Minimal: No identifiable suicidal ideation.  Patients presenting with no risk factors but with morbid ruminations; may be classified as minimal risk based on the severity of the depressive symptoms    Plan Of Care/Follow-up recommendations:  Activity:  As tolerated. Diet:  Low sodium heart healthy. Other:  Keep follow-up appointments.  [phone], MD 03/31/2016, 9:37 AM

## 2016-03-31 NOTE — [facility] Suicide Risk Assessment (Signed)
[facility] INPATIENT:  Family/Significant Other Suicide Prevention Education  Suicide Prevention Education:  Education Completed; [place] (friend) [phone] has been identified by the patient as the family member/significant other with whom the patient will be residing, and identified as the person(s) who will aid the patient in the event of a mental health crisis (suicidal ideations/suicide attempt).  With written consent from the patient, the family member/significant other has been provided the following suicide prevention education, prior to the and/or following the discharge of the patient.  The suicide prevention education provided includes the following:  Suicide risk factors  Suicide prevention and interventions  National Suicide Hotline telephone number  [facility] assessment telephone number  [facility] Emergency Assistance 911  [facility] and/or Residential Mobile Crisis Unit telephone number  Request made of family/significant other to:  Remove weapons (e.g., guns, rifles, knives), all items previously/currently identified as safety concern.    Remove drugs/medications (over-the-counter, prescriptions, illicit drugs), all items previously/currently identified as a safety concern.  The family member/significant other verbalizes understanding of the suicide prevention education information provided.  The family member/significant other agrees to remove the items of safety concern listed above.  Lulu Riding, MSW, LCSW  03/31/2016, 3:03 PM

## 2016-03-31 NOTE — Discharge Summary (Signed)
Physician Discharge Summary Note  Patient:  Curtis Cobb is an 45 y.o., male MRN:  [id number] DOB:  03-Jul-1971 Patient phone:  [phone] (home)  Patient address:   [address] [place] [address],  Total Time spent with patient: 30 minutes  Date of Admission:  03/30/2016 Date of Discharge: 03/31/2016  Reason for Admission:  Suicide attempt.  Identifying data. Curtis Cobb is a 45 year old male with a history of substance abuse and mood instability.  Chief complaint. "I haven't slept in 3 days."  History of present illness. Information was obtained from the patient and the chart. Curtis Cobb has a long history of mood instability and substance use with multiple psychiatric hospitalizations. He was brought to the hospital agitated and psychotic in the context of overdose of multiple medications. This happened over 3 day period. Reportedly the patient took overdose of Adderall, Neurontin, Elavil, and other medications including over-the-counter tianeptine sodium sold as food supplement. He was also positive for barbiturates indicated that he's been taking Fioricet. His primary psychiatrist is Dr. Barnie Mort who prescribes Elavil. During the interview the patient denies symptoms of depression or psychosis. He reports heightened anxiety due to approaching anniversary of his mother's and grandmother's death. He denies heavy drinking recently but has a history of severe alcohol dependence. He completed alcohol detox on medical floor. To Dr. Synthia Innocent, who saw him on the weekend, he denied depression, suicidal or homicidal ideation.   Past psychiatric history. He was hospitalized several times before for depression, suicide attempts by overdose, and alcoholism. He was twice in long-term rehabilitation and was able to maintain sobriety for a year. He has been tried on multiple medications in Zoloft, Paxil, Wellbutrin, trazodone, Tegretol, Wellbutrin, Neurontin, Elavil. His primary psychiatrist is Dr. Omelia Blackwater. He  used to go to [facility] for SA IOP. He does well with AA program. He believes that food supplement that he uses, tianeptine sodium, high doses binds to opioid receptor. He has been abusing it to address arthritic pain. He has been prescribed prednisone that is now lowered to 5 mg daily.  Family psychiatric history. Nonreported.  Social history. He lives in [place] with his girlfriend. 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 His family lives out of state and he has very little support except for a friend here. He is done well going to [employer] He has no health insurance.  Principal Problem: Substance-induced psychotic disorder with delusions [facility]) Discharge Diagnoses: Patient Active Problem List   Diagnosis Date Noted  . Sedative, hypnotic or anxiolytic use disorder, severe, dependence (HCC) [F13.20] 03/30/2016  . Drug overdose, multiple drugs [T50.901A] 03/25/2016  . Opioid use disorder, mild, in sustained remission [F11.90] 10/08/2015  . 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 Alcohol use disorder, severe, dependence (HCC) [F10.20] 10/07/2015  . Cannabis use disorder, severe, dependence (HCC) [F12.20] 10/07/2015  . 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 HTN (hypertension) [I10] 10/07/2015  . Rheumatoid arthritis (HCC) [M06.9] 10/07/2015    Past Psychiatric History: Depression, mood instability, substance abuse.  Past Medical History:  Past Medical History  Diagnosis Date  . Bipolar 1 disorder (HCC)   . Schizophrenia (HCC)   . Depression   . Anxiety   . Rheumatoid arthritis (HCC)   . Hypertension   . Schizoaffective disorder, depressive type (HCC) 10/06/2015  . Alcohol use disorder, moderate, dependence (HCC) 02/13/2014   History reviewed. No pertinent past surgical history. Family History:  Family History  Problem Relation Age  of Onset  . Family history unknown: Yes   Family Psychiatric  History:  None reported. Social History:  History  Alcohol Use  . Yes    Comment: gallon/day     History  Drug Use  . Yes  . Special: Amphetamines    Comment: THC    Social History   Social History  . Marital Status: Single    Spouse Name: N/A  . Number of Children: N/A  . Years of Education: N/A   Social History Main Topics  . Smoking status: Former Smoker -- 0.00 packs/day for 0 years  . Smokeless tobacco: None  . Alcohol Use: Yes     Comment: gallon/day  . Drug Use: Yes    Special: Amphetamines     Comment: THC  . Sexual Activity: Yes    Birth Control/ Protection: None   Other Topics Concern  . None   Social History Narrative    Hospital Course:    Curtis Cobb is a 45 year old male with history of mood instability and substance abuse admitted after presumed intentional overdose on multiple medications including TCA, Fioricet and stimulants. He was initially admitted to medical floor and due to agitation ended up in CCU on Precedex.  1. Suicidal ideation. The patient adamantly denies any thoughts or ideas or plans to harm hurt himself or others. He is able to contract for safety. He is forward thinking and optimistic about the future.  2. Mood. He has been maintained on Elavil in the community. We did not restarted elevated in the hospital as he overdosed on it. He however wants to continue with in the community under the guidance of Dr. Omelia Blackwater.  3. Insomnia.  He did not respond well to trazodone or Ambien. He uses Elavil as a sleeping aid at home.   4. Substance abuse. The patient has a history of alcoholism and polysubstance dependence. He refused residential treatment but will follow up with SA IOP at [facility].  5. Disposition. He was discharged to home with his girlfriend. He will follow up with Dr. Omelia Blackwater for medication management at [facility] for substance abuse treatment.  Physical Findings: AIMS:  , ,  ,  ,    CIWA:    COWS:     Musculoskeletal: Strength & Muscle Tone:  within normal limits Gait & Station: normal Patient leans: N/A  Psychiatric Specialty Exam: Review of Systems  Unable to perform ROS Psychiatric/Behavioral: The patient has insomnia.   All other systems reviewed and are negative.   Blood pressure 129/85, pulse 95, temperature 98.4 F (36.9 C), temperature source Oral, resp. rate 20, height 6\' 3"  (1.905 m), weight 93.441 kg (206 lb), SpO2 100 %.Body mass index is 25.75 kg/(m^2).  See SRA.                                                  Sleep:      Have you used any form of tobacco in the last 30 days? (Cigarettes, Smokeless Tobacco, Cigars, and/or Pipes): No  Has this patient used any form of tobacco in the last 30 days? (Cigarettes, Smokeless Tobacco, Cigars, and/or Pipes) Yes, No  Blood Alcohol level:  Lab Results  Component Value Date   [facility] <5 03/25/2016   ETH <5 11/13/2015    Metabolic Disorder Labs:  Lab Results  Component Value Date  HGBA1C 5.2 11/13/2015   No results found for: PROLACTIN No results found for: CHOL, TRIG, HDL, CHOLHDL, VLDL, LDLCALC  See Psychiatric Specialty Exam and Suicide Risk Assessment completed by Attending Physician prior to discharge.  Discharge destination:  Home  Is patient on multiple antipsychotic therapies at discharge:  No   Has Patient had three or more failed trials of antipsychotic monotherapy by history:  No  Recommended Plan for Multiple Antipsychotic Therapies: NA  Discharge Instructions    Diet - low sodium heart healthy    Complete by:  As directed      Increase activity slowly    Complete by:  As directed             Medication List    STOP taking these medications        zolpidem 5 MG tablet  Commonly known as:  AMBIEN      TAKE these medications      Indication   amitriptyline 100 MG tablet  Commonly known as:  ELAVIL  Take 1 tablet (100 mg total) by mouth at bedtime.   Indication:  Depression     predniSONE 5 MG tablet   Commonly known as:  DELTASONE  Take 1 tablet (5 mg total) by mouth daily with breakfast.      triamcinolone cream 0.1 %  Commonly known as:  KENALOG  Apply topically 2 (two) times daily.          Follow-up recommendations:  Activity:  As tolerated. Diet:  Low sodium heart healthy. Other:  Keep follow-up appointments.  Comments:    Signed: Kristine Linea, MD 03/31/2016, 9:54 AM

## 2016-03-31 NOTE — Progress Notes (Signed)
  [facility] Adult Case Management Discharge Plan :  Will you be returning to the same living situation after discharge:  Yes,  yes but is seeking alternative housing and has a list of options for the near future At discharge, do you have transportation home?: Yes,  patient has a friend that he lives with that will pick up Do you have the ability to pay for your medications: Yes,  patient referred to [facility] [phone]  Release of information consent forms completed and in the chart;  Patient's signature needed at discharge.  Patient to Follow up at: Follow-up Information    Follow up with [facility]. Go on 04/03/2016.   Why:  For follow-up care appt Friday 04/03/16 at 11:40am   Contact information:   [address] [place], [place] Ph [phone] Fax [phone]       Follow up with [facility]. Call on 03/31/2016.   Why:  For Substance Abuse Intensive Outpatient Treatement patient is to call Lorella Nimrod [phone] at discharge today 03/31/16   Contact information:   [address] [place], [place] Ph [phone] Fax [phone]      Next level of care provider has access to [facility] Link:no  Safety Planning and Suicide Prevention discussed: Yes,  SPE discussed with patient and [place] (friend) [phone]   Have you used any form of tobacco in the last 30 days? (Cigarettes, Smokeless Tobacco, Cigars, and/or Pipes): No  Has patient been referred to the Quitline?: N/A patient is not a smoker  Patient has been referred for addiction treatment: Yes  Lulu Riding, MSW, LCSW 03/31/2016, 3:10 PM  [phone]

## 2016-04-23 ENCOUNTER — Ambulatory Visit: Payer: Self-pay

## 2016-05-22 ENCOUNTER — Emergency Department
Admission: EM | Admit: 2016-05-22 | Discharge: 2016-05-25 | Disposition: A | Discharge status: Home / Self Care | Payer: Medicaid Other | Attending: Student | Admitting: Student

## 2016-05-22 ENCOUNTER — Emergency Department: Payer: Self-pay

## 2016-05-22 ENCOUNTER — Encounter: Payer: Self-pay | Admitting: Emergency Medicine

## 2016-05-22 DIAGNOSIS — R443 Hallucinations, unspecified: Secondary | ICD-10-CM

## 2016-05-22 DIAGNOSIS — F19959 Other psychoactive substance use, unspecified with psychoactive substance-induced psychotic disorder, unspecified: Secondary | ICD-10-CM | POA: Insufficient documentation

## 2016-05-22 DIAGNOSIS — I10 Essential (primary) hypertension: Secondary | ICD-10-CM | POA: Insufficient documentation

## 2016-05-22 DIAGNOSIS — Z87891 Personal history of nicotine dependence: Secondary | ICD-10-CM | POA: Insufficient documentation

## 2016-05-22 DIAGNOSIS — F1510 Other stimulant abuse, uncomplicated: Secondary | ICD-10-CM | POA: Insufficient documentation

## 2016-05-22 DIAGNOSIS — F319 Bipolar disorder, unspecified: Secondary | ICD-10-CM | POA: Insufficient documentation

## 2016-05-22 DIAGNOSIS — F259 Schizoaffective disorder, unspecified: Secondary | ICD-10-CM | POA: Insufficient documentation

## 2016-05-22 DIAGNOSIS — M069 Rheumatoid arthritis, unspecified: Secondary | ICD-10-CM | POA: Insufficient documentation

## 2016-05-22 LAB — COMPREHENSIVE METABOLIC PANEL
ALT: 43 U/L (ref 17–63)
AST: 43 U/L — ABNORMAL HIGH (ref 15–41)
Albumin: 4.2 g/dL (ref 3.5–5.0)
Alkaline Phosphatase: 66 U/L (ref 38–126)
Anion gap: 10 (ref 5–15)
BUN: 8 mg/dL (ref 6–20)
CHLORIDE: 102 mmol/L (ref 101–111)
CO2: 24 mmol/L (ref 22–32)
CREATININE: 1.09 mg/dL (ref 0.61–1.24)
Calcium: 9 mg/dL (ref 8.9–10.3)
Glucose, Bld: 110 mg/dL — ABNORMAL HIGH (ref 65–99)
Potassium: 3.1 mmol/L — ABNORMAL LOW (ref 3.5–5.1)
Sodium: 136 mmol/L (ref 135–145)
TOTAL PROTEIN: 7.4 g/dL (ref 6.5–8.1)
Total Bilirubin: 0.5 mg/dL (ref 0.3–1.2)

## 2016-05-22 LAB — CBC
HCT: 39.8 % — ABNORMAL LOW (ref 40.0–52.0)
Hemoglobin: 13.5 g/dL (ref 13.0–18.0)
MCH: 29.8 pg (ref 26.0–34.0)
MCHC: 34 g/dL (ref 32.0–36.0)
MCV: 87.6 fL (ref 80.0–100.0)
PLATELETS: 286 10*3/uL (ref 150–440)
RBC: 4.55 MIL/uL (ref 4.40–5.90)
RDW: 13.7 % (ref 11.5–14.5)
WBC: 10 10*3/uL (ref 3.8–10.6)

## 2016-05-22 LAB — URINE DRUG SCREEN, QUALITATIVE (ARMC ONLY)
AMPHETAMINES, UR SCREEN: NOT DETECTED
Barbiturates, Ur Screen: NOT DETECTED
Benzodiazepine, Ur Scrn: NOT DETECTED
Cannabinoid 50 Ng, Ur ~~LOC~~: NOT DETECTED
Cocaine Metabolite,Ur ~~LOC~~: NOT DETECTED
MDMA (ECSTASY) UR SCREEN: NOT DETECTED
METHADONE SCREEN, URINE: NOT DETECTED
OPIATE, UR SCREEN: NOT DETECTED
PHENCYCLIDINE (PCP) UR S: NOT DETECTED
Tricyclic, Ur Screen: NOT DETECTED

## 2016-05-22 LAB — CK: CK TOTAL: 162 U/L (ref 49–397)

## 2016-05-22 LAB — ETHANOL

## 2016-05-22 LAB — ACETAMINOPHEN LEVEL: Acetaminophen (Tylenol), Serum: <10 ug/mL — ABNORMAL LOW (ref 10–30)

## 2016-05-22 LAB — SALICYLATE LEVEL

## 2016-05-22 MED ORDER — SODIUM CHLORIDE 0.9 % IV BOLUS (SEPSIS)
1000.0000 mL | Freq: Once | INTRAVENOUS | Status: AC
  Administered 2016-05-22: 1000 mL via INTRAVENOUS

## 2016-05-22 NOTE — BH Assessment (Signed)
Assessment Note  Curtis Cobb is an 45 y.o. male initially presenting to the ED for bilateral joint pain.  However, according to pt's girlfriend, pt is paranoid, and experiencing hallucinations and confusion. Pt denies having hallucinations even though this clinician observed patient having a conversation with someone not in the room.  Patient was seen in ED six weeks ago for similar symptoms. Patient denies SI and HI. Patient reports he has not drank alcohol in over 1 month  Diagnosis: Hallucinations  Past Medical History:  Past Medical History  Diagnosis Date  . Bipolar 1 disorder (HCC)   . Schizophrenia (HCC)   . Depression   . Anxiety   . Rheumatoid arthritis (HCC)   . Hypertension   . Schizoaffective disorder, depressive type (HCC) 10/06/2015  . Alcohol use disorder, moderate, dependence (HCC) 02/13/2014    History reviewed. No pertinent past surgical history.  Family History:  Family History  Problem Relation Age of Onset  . Family history unknown: Yes    Social History:  reports that he has quit smoking. He does not have any smokeless tobacco history on file. He reports that he drinks alcohol. He reports that he uses illicit drugs (Amphetamines).  Additional Social History:  Alcohol / Drug Use History of alcohol / drug use?: No history of alcohol / drug abuse (Pt denies)  CIWA: CIWA-Ar BP: 132/82 mmHg Pulse Rate: 90 COWS:    Allergies:  Allergies  Allergen Reactions  . Sulfa Antibiotics Other (See Comments)    Unknown reaction    Home Medications:  (Not in a hospital admission)  OB/GYN Status:  No LMP for male patient.  General Assessment Data Location of Assessment: [facility] ED TTS Assessment: In system Is this a Tele or Face-to-Face Assessment?: Face-to-Face Is this an Initial Assessment or a Re-assessment for this encounter?: Initial Assessment Marital status: Single Maiden name: N/A Is patient pregnant?: No Pregnancy Status: No Living Arrangements:  Spouse/significant other Can pt return to current living arrangement?: Yes Admission Status: Voluntary Is patient capable of signing voluntary admission?: Yes Referral Source: Self/Family/Friend Insurance type: None  Medical Screening Exam [facility] Walk-in ONLY) Medical Exam completed: Yes  Crisis Care Plan Living Arrangements: Spouse/significant other Legal Guardian: Other: (self) Name of Psychiatrist: None reported Name of Therapist: None reported  Education Status Is patient currently in school?: No Current Grade: N/A Highest grade of school patient has completed: N/A Name of school: N/A Contact person: N/A  Risk to self with the past 6 months Suicidal Ideation: No Has patient been a risk to self within the past 6 months prior to admission? : No Suicidal Intent: No Has patient had any suicidal intent within the past 6 months prior to admission? : No Is patient at risk for suicide?: No Suicidal Plan?: No Has patient had any suicidal plan within the past 6 months prior to admission? : No Access to Means: Yes Specify Access to Suicidal Means: Pt has access to drugs What has been your use of drugs/alcohol within the last 12 months?: Pt has a prior hx of alcohol and cannabis abuse Previous Attempts/Gestures: Yes How many times?: 1 Other Self Harm Risks: None identified Triggers for Past Attempts: None known, Other (Comment) (substance induced) Intentional Self Injurious Behavior: None Family Suicide History: No Recent stressful life event(s): Recent negative physical changes Persecutory voices/beliefs?: No Depression: No Substance abuse history and/or treatment for substance abuse?: Yes Suicide prevention information given to non-admitted patients: Not applicable  Risk to Others within the past 6 months  Homicidal Ideation: No Does patient have any lifetime risk of violence toward others beyond the six months prior to admission? : No Thoughts of Harm to Others: No Current  Homicidal Intent: No Current Homicidal Plan: No Access to Homicidal Means: No Identified Victim: None identified History of harm to others?: No Assessment of Violence: None Noted Violent Behavior Description: None identified Does patient have access to weapons?: No Criminal Charges Pending?: No Does patient have a court date: No Is patient on probation?: No  Psychosis Hallucinations: Auditory Delusions: None noted  Mental Status Report Appearance/Hygiene: In scrubs Eye Contact: Fair Motor Activity: Freedom of movement Speech: Logical/coherent Level of Consciousness: Alert Mood: Anxious Affect: Anxious Anxiety Level: Minimal Thought Processes: Coherent, Relevant Judgement: Partial Orientation: Person, Time, Place, Situation Obsessive Compulsive Thoughts/Behaviors: None  Cognitive Functioning Concentration: Normal Memory: Recent Intact, Remote Intact IQ: Average Insight: Fair Impulse Control: Fair Appetite: Good Weight Loss: 0 Weight Gain: 0 Sleep: No Change Vegetative Symptoms: None  ADLScreening [facility] Assessment Services) Patient's cognitive ability adequate to safely complete daily activities?: Yes Patient able to express need for assistance with ADLs?: Yes Independently performs ADLs?: Yes (appropriate for developmental age)  Prior Inpatient Therapy Prior Inpatient Therapy: Yes Prior Therapy Dates: 03/2016 Prior Therapy Facilty/Provider(s): [facility] Reason for Treatment: alcohol  Prior Outpatient Therapy Prior Outpatient Therapy: No Prior Therapy Dates: None reported Prior Therapy Facilty/Provider(s): None reported Reason for Treatment: None reported Does patient have an ACCT team?: No Does patient have Intensive In-House Services?  : No Does patient have [facility] services? : No Does patient have P4CC services?: No  ADL Screening (condition at time of admission) Patient's cognitive ability adequate to safely complete daily activities?: Yes Patient able to  express need for assistance with ADLs?: Yes Independently performs ADLs?: Yes (appropriate for developmental age)       Abuse/Neglect Assessment (Assessment to be complete while patient is alone) Physical Abuse: Denies Verbal Abuse: Denies Sexual Abuse: Denies Exploitation of patient/patient's resources: Denies Self-Neglect: Denies Values / Beliefs Cultural Requests During Hospitalization: None Spiritual Requests During Hospitalization: None Consults Spiritual Care Consult Needed: No Social Work Consult Needed: No Merchant navy officer (For Healthcare) Does patient have an advance directive?: No Would patient like information on creating an advanced directive?: No - patient declined information    Additional Information 1:1 In Past 12 Months?: No CIRT Risk: No Elopement Risk: No Does patient have medical clearance?: No     Disposition:  Disposition Initial Assessment Completed for this Encounter: Yes Disposition of Patient: Other dispositions Other disposition(s): Other (Comment) (Pending Psych MD consult)  On Site Evaluation by:   Reviewed with Physician:    [place] 05/22/2016 10:45 PM

## 2016-05-22 NOTE — ED Notes (Addendum)
Patient presents to the ED with paranoia, hallucinations, confusion per girlfriend.  Patient is not making much sense in triage.  Patient was seen in ED six weeks ago for similar symptoms.  Girlfriend states previous episode of substance induced but girlfriend states she does not believe this episode is substance induced.  Symptoms have been progressing over the past 1 week per girlfriend.  Patient states he is hearing voices, "sort of".  Patient denies SI and HI.  Patient's girlfriend reports past history of aggression.  Patient reports he has not drank alcohol in over 1 month.

## 2016-05-22 NOTE — ED Notes (Addendum)
Pt reports that he is experiencing bilat joint pain in all joints - He has had this pain for over a year but he states the level of pain today is steady and will not stop so he came to the ED for eval - Pt denies hallucinations or paranoia and states "I used to have schizo affective disorder"

## 2016-05-22 NOTE — ED Notes (Signed)
Pt is resting quietly with eyes closed.

## 2016-05-22 NOTE — ED Provider Notes (Addendum)
[facility] Emergency Department Provider Note   ____________________________________________  Time seen: Approximately 7:29 PM  I have reviewed the triage vital signs and the nursing notes.   HISTORY  Chief Complaint Hallucinations; Paranoid; and Psychiatric Evaluation    HPI Curtis Cobb is a 45 y.o. male with rheumatoid arthritis, schizoaffective disorder, mood instability and substance abuse with multiple prior psychiatric hospitalizations, history of substance-induced psychosis who presents for evaluation of hallucinations and paranoia today, gradual onset, constant, no modifying factors. His girlfriend reported that today he was walking around the house, thought he was in "the band room" hearing people talk to him. Patient reports that he is only having joint pain. He denies any suicidal ideation or homicidal ideation. No fevers or chills. No recent illness including no cough, sneezing, runny nose, vomiting, diarrhea, fevers, chills, no chest pain or difficulty breathing.      Past Medical History  Diagnosis Date  . Bipolar 1 disorder (HCC)   . Schizophrenia (HCC)   . Depression   . Anxiety   . Rheumatoid arthritis (HCC)   . Hypertension   . Schizoaffective disorder, depressive type (HCC) 10/06/2015  . Alcohol use disorder, moderate, dependence (HCC) 02/13/2014    Patient Active Problem List   Diagnosis Date Noted  . 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 Drug overdose, multiple drugs 03/25/2016  . Opioid use disorder, mild, in sustained remission 10/08/2015  . 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 Alcohol use disorder, severe, dependence (HCC) 10/07/2015  . Cannabis use disorder, severe, dependence (HCC) 10/07/2015  . 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 HTN (hypertension) 10/07/2015  . Rheumatoid arthritis (HCC) 10/07/2015    History reviewed. No  pertinent past surgical history.  Current Outpatient Rx  Name  Route  Sig  Dispense  Refill  . amitriptyline (ELAVIL) 100 MG tablet   Oral   Take 1 tablet (100 mg total) by mouth at bedtime.   30 tablet   0   . predniSONE (DELTASONE) 5 MG tablet   Oral   Take 1 tablet (5 mg total) by mouth daily with breakfast.         . triamcinolone cream (KENALOG) 0.1 %   Topical   Apply topically 2 (two) times daily.   30 g   0     Allergies Sulfa antibiotics  Family History  Problem Relation Age of Onset  . Family history unknown: Yes    Social History Social History  Substance Use Topics  . Smoking status: Former Smoker -- 0.00 packs/day for 0 years  . Smokeless tobacco: None  . Alcohol Use: Yes     Comment: gallon/day    Review of Systems Constitutional: No fever/chills Eyes: No visual changes. ENT: No sore throat. Cardiovascular: Denies chest pain. Respiratory: Denies shortness of breath. Gastrointestinal: No abdominal pain.  No nausea, no vomiting.  No diarrhea.  No constipation. Genitourinary: Negative for dysuria. Musculoskeletal: Negative for back pain. Skin: Negative for rash. Neurological: Negative for headaches, focal weakness or numbness.  10-point ROS otherwise negative.  ____________________________________________   PHYSICAL EXAM:  Filed Vitals:   05/22/16 2000 05/22/16 2030 05/22/16 2230 05/23/16 0027  BP: 134/80 141/96 132/82 138/80  Pulse: 118 121 90 95  Temp:      TempSrc:      Resp:  28  18  Height:      Weight:      SpO2: 96% 100% 98% 99%  VITAL SIGNS: ED Triage Vitals  Enc Vitals Group     BP 05/22/16 1840 134/83 mmHg     Pulse Rate 05/22/16 1840 147     Resp 05/22/16 1840 20     Temp 05/22/16 1840 99.7 F (37.6 C)     Temp Source 05/22/16 1840 Oral     SpO2 05/22/16 1840 98 %     Weight 05/22/16 1840 214 lb (97.07 kg)     Height 05/22/16 1840 6\' 2"  (1.88 m)     Head Cir --      Peak Flow --      Pain Score 05/22/16 1841  9     Pain Loc --      Pain Edu? --      Excl. in GC? --     Constitutional: Alert and oriented. Well appearing and in no acute distress. Eyes: Conjunctivae are normal. PERRL. EOMI. Head: Atraumatic. Nose: No congestion/rhinnorhea. Mouth/Throat: Mucous membranes are moist.  Oropharynx non-erythematous. Neck: No stridor.Supple without meningismus. Cardiovascular: Tachycardic rate, regular rhythm. Grossly normal heart sounds.  Good peripheral circulation. Respiratory: Normal respiratory effort.  No retractions. Lungs CTAB. Gastrointestinal: Soft and nontender. No distention. No CVA tenderness. Genitourinary: deferred Musculoskeletal: No lower extremity tenderness nor edema.  No joint effusions. Neurologic:  Normal speech and language. No gross focal neurologic deficits are appreciated. No gait instability. Skin:  Skin is warm, dry and intact. No rash noted. Psychiatric: Mood is mildly anxious at times, affect is restricted. Speech and behavior are normal.  ____________________________________________   LABS (all labs ordered are listed, but only abnormal results are displayed)  Labs Reviewed  COMPREHENSIVE METABOLIC PANEL - Abnormal; Notable for the following:    Potassium 3.1 (*)    Glucose, Bld 110 (*)    AST 43 (*)    All other components within normal limits  ACETAMINOPHEN LEVEL - Abnormal; Notable for the following:    Acetaminophen (Tylenol), Serum <10 (*)    All other components within normal limits  CBC - Abnormal; Notable for the following:    HCT 39.8 (*)    All other components within normal limits  ETHANOL  SALICYLATE LEVEL  URINE DRUG SCREEN, QUALITATIVE ([facility] ONLY)  CK   ____________________________________________  EKG  ED ECG REPORT I, 05/24/16, the attending physician, personally viewed and interpreted this ECG.   Date: 05/22/2016  EKG Time: 18:48  Rate: 148  Rhythm:  sinus tachycardia  Axis: normal  Intervals:none  ST&T Change: No acute ST  elevation.  ____________________________________________  RADIOLOGY  CXR IMPRESSION: No active disease  ____________________________________________   PROCEDURES  Procedure(s) performed: None  Critical Care performed: No  ____________________________________________   INITIAL IMPRESSION / ASSESSMENT AND PLAN / ED COURSE  Pertinent labs & imaging results that were available during my care of the patient were reviewed by me and considered in my medical decision making (see chart for details).  Curtis Cobb is a 45 y.o. male with rheumatoid arthritis, schizoaffective disorder, mood instability and substance abuse with multiple prior psychiatric hospitalizations, history of substance-induced psychosis who presents for evaluation of hallucinations and paranoia today. On exam, he is mildly anxious appearing, restricted affect, he is tachycardic but he is currently asymptomatic, the remainder of his vital signs are stable, he is afebrile. We'll obtain screening labs, treat with IV fluids, check a chest x-ray, place involuntary commitment and consult psychiatry as well as behavioral health.  ----------------------------------------- 1:05 AM on 05/23/2016 ----------------------------------------- The patient's tachycardia completely resolved with  IV fluids. I reviewed his labs. CBC, CMP generally unremarkable, undetectable ethanol, salicylate and seated with some levels. Urine drug screen is negative. Chest x-ray clear. The patient is medically cleared. Care transferred to Dr. Zenda Alpers.  ____________________________________________   FINAL CLINICAL IMPRESSION(S) / ED DIAGNOSES  Final diagnoses:  Schizoaffective disorder, unspecified type (HCC)  Hallucinations      NEW MEDICATIONS STARTED DURING THIS VISIT:  New Prescriptions   No medications on file     Note:  This document was prepared using Dragon voice recognition software and may include unintentional dictation  errors.    Gayla Doss, MD 05/23/16 2831  Gayla Doss, MD 05/23/16 5176

## 2016-05-23 MED ORDER — LORAZEPAM 1 MG PO TABS
1.0000 mg | ORAL_TABLET | Freq: Once | ORAL | Status: AC
  Administered 2016-05-23: 1 mg via ORAL

## 2016-05-23 MED ORDER — LORAZEPAM 1 MG PO TABS
ORAL_TABLET | ORAL | Status: AC
  Administered 2016-05-23: 1 mg via ORAL
  Filled 2016-05-23: qty 1

## 2016-05-23 MED ORDER — QUETIAPINE FUMARATE 25 MG PO TABS
50.0000 mg | ORAL_TABLET | Freq: Every day | ORAL | Status: DC
  Administered 2016-05-23 – 2016-05-24 (×2): 50 mg via ORAL
  Filled 2016-05-23 (×2): qty 2

## 2016-05-23 MED ORDER — BUSPIRONE HCL 5 MG PO TABS
5.0000 mg | ORAL_TABLET | Freq: Two times a day (BID) | ORAL | Status: DC
  Administered 2016-05-23 – 2016-05-25 (×5): 5 mg via ORAL
  Filled 2016-05-23 (×5): qty 1

## 2016-05-23 NOTE — ED Notes (Signed)
Patient talking on phone. °

## 2016-05-23 NOTE — Progress Notes (Signed)
Patient to [facility] from ED ambulatory without difficulty, to room. Patient is alert and oriented, warm and dry in no apparent distress. Patient denies hearing voices, SI, HI, and AVH. Patient is calm and cooperative, stating that he is not sure why he is here. Patient oriented to unit and made aware of security cameras and Q15 minute rounds. Patient encouraged to let Nursing staff know of any concerns or needs.

## 2016-05-23 NOTE — ED Notes (Signed)
Sandwich tray and coke given at this time.

## 2016-05-23 NOTE — Consult Note (Signed)
[facility] Psychiatry Consult   Reason for Consult:  Drug use Referring Physician:  EDP Patient Identification: Curtis Cobb MRN:  [id number] Principal Diagnosis: Substance-induced psychotic disorder Diagnosis:   Patient Active Problem List   Diagnosis Date Noted  . Sedative, hypnotic or anxiolytic use disorder, severe, dependence ([place]) [F13.20] 03/30/2016  . Drug overdose, multiple drugs [T50.901A] 03/25/2016  . Opioid use disorder, mild, in sustained remission [F11.90] 10/08/2015  . Substance-induced psychotic disorder with delusions ([place]) [F19.950] 10/08/2015  . Alcohol use disorder, severe, dependence ([place]) [F10.20] 10/07/2015  . Cannabis use disorder, severe, dependence ([place]) [F12.20] 10/07/2015  . 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 HTN (hypertension) [I10] 10/07/2015  . Rheumatoid arthritis ([place]) [M06.9] 10/07/2015    Total Time spent with patient: 1 hour  Subjective:   Curtis Cobb is a 45 y.o. male patient admitted with worsening of the paranoia.  HPI:    Curtis Cobb is an 45 y.o. male initially presenting to the ED for bilateral joint pain. However, according to pt's girlfriend, pt is paranoid, and experiencing hallucinations and confusion. Pt denies having hallucinations even though this clinician observed patient having a conversation with someone not in the room. Patient was seen in ED six weeks ago for similar symptoms. Patient denies SI and HI. Patient reports he has not drank alcohol in over 1 month  Patient was recently discharged from the inpatient behavioral health unit and he lived with his girlfriend and her kids. He reported that he has not been using any drugs or alcohol for the past few weeks. Patient reported that he was having hallucinations in the past but they come in episodes. He currently denied having any suicidal ideations or plans. He reported that he has a rocky relationship with his girlfriend. He stated that after his  discharge from the inpatient unit he was placed on Elavil which was not helpful. He wants medication to help with his mood symptoms. He reported that he went to [facility] but they did not help him as he has a needle stuck in his arm related to his IV drug use.  He came to the emergency room to get help. However in the emergency room physician has contacted the on-call surgeon who has advised that it can be taken care of as an outpatient surgery.  Patient currently denied having any paranoia. He appeared calm and collected. He denied having any suicidal ideations or plans.  We discussed about his outpatient treatment as patient reported that he can go back to [facility]  as all his belongings are at the same place and he wants to continue with his outpatient treatment plan. He reported that he was discharged on Elavil which has not been helpful and he wants to start taking medications to control his mood symptoms. We discussed about starting him on Seroquel at a low dose as he reported that a higher dose was making him very tired.   Later on his girlfriend called and she was very angry agitated and reported that   she brought him from the hospital on involuntary commitment as she does not want the patient to return back home. She reported that she does not feel safe around him especially with her children. She reported that he has history of hallucinations in the past. She wants him to stay in the hospital. We discussed with her that patient is not having any paranoia or hallucinations at this time. If she does not feel safe she can get restraining  order against him.  She reported that she has been the team leader for the act team and wants him to be in the hospital for a longer period of time for his drug use.   Patient has also been medically cleared and he will get treatment for his needle as an outpatient surgical procedure   Patient currently denied having any suicidal ideations or plans. He appeared calm and is  looking for medications to help with his mood symptoms at this time.   Social history: He was staying in an [place] until several weeks ago. Now out on the street. Says he thinks he can find places to stay but also wants to go to [facility]  at least temporarily. Not currently working. Has had jobs in the past does not get disability.  Family history: Patient says quite a few people on his father's side of the family had substance abuse problems.  Medical history: He has been told that he has diabetes but he believes it is only related to his weight and that if he drops some weight the diabetes will go away. He doesn't take treatment of it very seriously.  Past Psychiatric History: Patient has had multiple presentations to emergency rooms and a lot of psychiatric treatment in the past. He says that the only hospitalizations he's had in his mind were related directly to substance abuse. He's been struggling with substance abuse problems since he was a young adolescent. Longest sobriety was for a year. Usually uses a lot of alcohol but has not had delirium tremens. Says he's been drinking less than usual recently and using more stimulants. He has been prescribed Celexa gabapentin and buspirone from the last time he was at a hospital in [place] but is noncompliant with them. Denies he's ever tried to seriously kill himself denies any history of violence.    Risk to Self: Suicidal Ideation: No Suicidal Intent: No Is patient at risk for suicide?: No Suicidal Plan?: No Access to Means: Yes Specify Access to Suicidal Means: Pt has access to drugs What has been your use of drugs/alcohol within the last 12 months?: Pt has a prior hx of alcohol and cannabis abuse How many times?: 1 Other Self Harm Risks: None identified Triggers for Past Attempts: None known, Other (Comment) (substance induced) Intentional Self Injurious Behavior: None Risk to Others: Homicidal Ideation: No Thoughts of Harm to Others:  No Current Homicidal Intent: No Current Homicidal Plan: No Access to Homicidal Means: No Identified Victim: None identified History of harm to others?: No Assessment of Violence: None Noted Violent Behavior Description: None identified Does patient have access to weapons?: No Criminal Charges Pending?: No Does patient have a court date: No Prior Inpatient Therapy: Prior Inpatient Therapy: Yes Prior Therapy Dates: 03/2016 Prior Therapy Facilty/Provider(s): [facility] Reason for Treatment: alcohol Prior Outpatient Therapy: Prior Outpatient Therapy: No Prior Therapy Dates: None reported Prior Therapy Facilty/Provider(s): None reported Reason for Treatment: None reported Does patient have an ACCT team?: No Does patient have Intensive In-House Services?  : No Does patient have [facility] services? : No Does patient have P4CC services?: No  Past Medical History:  Past Medical History  Diagnosis Date  . Bipolar 1 disorder ([place])   . Schizophrenia ([place])   . Depression   . Anxiety   . Rheumatoid arthritis ([place])   . Hypertension   . Schizoaffective disorder, depressive type ([place]) 10/06/2015  . Alcohol use disorder, moderate, dependence ([place]) 02/13/2014   History reviewed. No pertinent past surgical  history. Family History:  Family History  Problem Relation Age of Onset  . Family history unknown: Yes   Family Psychiatric  History: none reported  Social History:  History  Alcohol Use  . Yes    Comment: gallon/day     History  Drug Use  . Yes  . Special: Amphetamines    Comment: THC    Social History   Social History  . Marital Status: Single    Spouse Name: N/A  . Number of Children: N/A  . Years of Education: N/A   Social History Main Topics  . Smoking status: Former Smoker -- 0.00 packs/day for 0 years  . Smokeless tobacco: None  . Alcohol Use: Yes     Comment: gallon/day  . Drug Use: Yes    Special: Amphetamines     Comment: THC  . Sexual Activity: Yes    Birth  Control/ Protection: None   Other Topics Concern  . None   Social History Narrative   Additional Social History:   Lives with a girlfriend. He is currently unemployed Allergies:   Allergies  Allergen Reactions  . Sulfa Antibiotics Other (See Comments)    Unknown reaction    Labs:  Results for orders placed or performed during the hospital encounter of 05/22/16 (from the past 48 hour(s))  Comprehensive metabolic panel     Status: Abnormal   Collection Time: 05/22/16  6:58 PM  Result Value Ref Range   Sodium 136 135 - 145 mmol/L   Potassium 3.1 (L) 3.5 - 5.1 mmol/L   Chloride 102 101 - 111 mmol/L   CO2 24 22 - 32 mmol/L   Glucose, Bld 110 (H) 65 - 99 mg/dL   BUN 8 6 - 20 mg/dL   Creatinine, Ser 1.09 0.61 - 1.24 mg/dL   Calcium 9.0 8.9 - 10.3 mg/dL   Total Protein 7.4 6.5 - 8.1 g/dL   Albumin 4.2 3.5 - 5.0 g/dL   AST 43 (H) 15 - 41 U/L   ALT 43 17 - 63 U/L   Alkaline Phosphatase 66 38 - 126 U/L   Total Bilirubin 0.5 0.3 - 1.2 mg/dL   GFR calc non Af Amer >60 >60 mL/min   GFR calc Af Amer >60 >60 mL/min    Comment: (NOTE) The eGFR has been calculated using the CKD EPI equation. This calculation has not been validated in all clinical situations. eGFR's persistently <60 mL/min signify possible Chronic Kidney Disease.    Anion gap 10 5 - 15  Ethanol     Status: None   Collection Time: 05/22/16  6:58 PM  Result Value Ref Range   Alcohol, Ethyl (B) <5 <5 mg/dL    Comment:        LOWEST DETECTABLE LIMIT FOR SERUM ALCOHOL IS 5 mg/dL FOR MEDICAL PURPOSES ONLY   Salicylate level     Status: None   Collection Time: 05/22/16  6:58 PM  Result Value Ref Range   Salicylate Lvl <0.8 2.8 - 30.0 mg/dL  Acetaminophen level     Status: Abnormal   Collection Time: 05/22/16  6:58 PM  Result Value Ref Range   Acetaminophen (Tylenol), Serum <10 (L) 10 - 30 ug/mL    Comment:        THERAPEUTIC CONCENTRATIONS VARY SIGNIFICANTLY. A RANGE OF 10-30 ug/mL MAY BE AN  EFFECTIVE CONCENTRATION FOR MANY PATIENTS. HOWEVER, SOME ARE BEST TREATED AT CONCENTRATIONS OUTSIDE THIS RANGE. ACETAMINOPHEN CONCENTRATIONS >150 ug/mL AT 4 HOURS AFTER INGESTION AND >50 ug/mL  AT 12 HOURS AFTER INGESTION ARE OFTEN ASSOCIATED WITH TOXIC REACTIONS.   cbc     Status: Abnormal   Collection Time: 05/22/16  6:58 PM  Result Value Ref Range   WBC 10.0 3.8 - 10.6 K/uL   RBC 4.55 4.40 - 5.90 MIL/uL   Hemoglobin 13.5 13.0 - 18.0 g/dL   HCT 39.8 (L) 40.0 - 52.0 %   MCV 87.6 80.0 - 100.0 fL   MCH 29.8 26.0 - 34.0 pg   MCHC 34.0 32.0 - 36.0 g/dL   RDW 13.7 11.5 - 14.5 %   Platelets 286 150 - 440 K/uL  CK     Status: None   Collection Time: 05/22/16  6:58 PM  Result Value Ref Range   Total CK 162 49 - 397 U/L  Urine Drug Screen, Qualitative     Status: None   Collection Time: 05/22/16 10:15 PM  Result Value Ref Range   Tricyclic, Ur Screen NONE DETECTED NONE DETECTED   Amphetamines, Ur Screen NONE DETECTED NONE DETECTED   MDMA (Ecstasy)Ur Screen NONE DETECTED NONE DETECTED   Cocaine Metabolite,Ur [place] NONE DETECTED NONE DETECTED   Opiate, Ur Screen NONE DETECTED NONE DETECTED   Phencyclidine (PCP) Ur S NONE DETECTED NONE DETECTED   Cannabinoid 50 Ng, Ur  NONE DETECTED NONE DETECTED   Barbiturates, Ur Screen NONE DETECTED NONE DETECTED   Benzodiazepine, Ur Scrn NONE DETECTED NONE DETECTED   Methadone Scn, Ur NONE DETECTED NONE DETECTED    Comment: (NOTE) 650  Tricyclics, urine               Cutoff 1000 ng/mL 200  Amphetamines, urine             Cutoff 1000 ng/mL 300  MDMA (Ecstasy), urine           Cutoff 500 ng/mL 400  Cocaine Metabolite, urine       Cutoff 300 ng/mL 500  Opiate, urine                   Cutoff 300 ng/mL 600  Phencyclidine (PCP), urine      Cutoff 25 ng/mL 700  Cannabinoid, urine              Cutoff 50 ng/mL 800  Barbiturates, urine             Cutoff 200 ng/mL 900  Benzodiazepine, urine           Cutoff 200 ng/mL 1000 Methadone, urine                 Cutoff 300 ng/mL 1100 1200 The urine drug screen provides only a preliminary, unconfirmed 1300 analytical test result and should not be used for non-medical 1400 purposes. Clinical consideration and professional judgment should 1500 be applied to any positive drug screen result due to possible 1600 interfering substances. A more specific alternate chemical method 1700 must be used in order to obtain a confirmed analytical result.  1800 Gas chromato graphy / mass spectrometry (GC/MS) is the preferred 1900 confirmatory method.     Current Facility-Administered Medications  Medication Dose Route Frequency Provider Last Rate Last Dose  . busPIRone (BUSPAR) tablet 5 mg  5 mg Oral BID Rainey Pines, MD      . QUEtiapine (SEROQUEL) tablet 50 mg  50 mg Oral QHS Rainey Pines, MD       Current Outpatient Prescriptions  Medication Sig Dispense Refill  . amitriptyline (ELAVIL) 100 MG tablet Take 1 tablet (100 mg  total) by mouth at bedtime. 30 tablet 0  . predniSONE (DELTASONE) 5 MG tablet Take 1 tablet (5 mg total) by mouth daily with breakfast.    . triamcinolone cream (KENALOG) 0.1 % Apply topically 2 (two) times daily. 30 g 0    Musculoskeletal: Strength & Muscle Tone: within normal limits Gait & Station: normal Patient leans: N/A  Psychiatric Specialty Exam: Physical Exam  Review of Systems  Musculoskeletal: Positive for myalgias and joint pain.  Psychiatric/Behavioral: Positive for depression. The patient is nervous/anxious and has insomnia.     Blood pressure 138/80, pulse 95, temperature 99.7 F (37.6 C), temperature source Oral, resp. rate 18, height _0  (1.88 m), weight 214 lb (97.07 kg), SpO2 99 %.Body mass index is 27.46 kg/(m^2).  General Appearance: Casual  Eye Contact:  Fair  Speech:  Clear and Coherent  Volume:  Normal  Mood:  Anxious  Affect:  Congruent  Thought Process:  Coherent and Goal Directed  Orientation:  Full (Time, Place, and Person)  Thought Content:   Logical  Suicidal Thoughts:  No  Homicidal Thoughts:  No  Memory:  Immediate;   Fair Recent;   Fair Remote;   Fair  Judgement:  Fair  Insight:  Fair  Psychomotor Activity:  Normal  Concentration:  Concentration: Fair and Attention Span: Fair  Recall:  [employer] of Knowledge:  Fair  Language:  Fair  Akathisia:  No  Handed:  Right  AIMS (if indicated):     Assets:  Communication Skills Desire for Improvement Physical Health Talents/Skills  ADL's:  Intact  Cognition:  WNL  Sleep:        Treatment Plan Summary: Daily contact with patient to assess and evaluate symptoms and progress in treatment and Medication management  Disposition: Patient does not meet criteria for psychiatric inpatient admission. Supportive therapy provided about ongoing stressors. Refer to IOP.   I have discussed with patient and his girlfriend about the outpatient substance abuse program in detail. I have advised the girlfriend that she can get restraining orders if she does not feel safe from him  He does not meet the criteria for inpatient admission at this time. I will release him from the involuntary commitment. I will start him on Seroquel 50 mg by mouth daily at bedtime I will also start him on BuSpar 5 mg by mouth twice a day Will continue to monitor Once he becomes psychiatrically stable he can be discharged from the hospital and will be provided appropriate information for his outpatient follow-up by the behavioral health counselor  Thank you for allowing me to participate in the care of this patient  Rainey Pines, MD 05/23/2016 12:07 PM

## 2016-05-23 NOTE — ED Notes (Signed)
Patient noted in room calmly watching television. No complaints or concerns voiced. No distress or abnormal behavior noted. Patient denies SI, hallucinations, HI. Will continue to monitor with security cameras. Q15 minute rounds continue.

## 2016-05-23 NOTE — Progress Notes (Signed)
Report given to Toniann Fail L. at this time.

## 2016-05-23 NOTE — ED Notes (Signed)
Patient lying on bed with tv on , but looks uncomfortable, nurse ask him how He was and He states " not to good, I feel so anxious, I need something to help me, nurse called Ed Doctor and received order for 1 mg of ativan, nurse ask if anything triggered having anxiety and He states " NO" Patient is safe and will be continually monitored.

## 2016-05-23 NOTE — ED Notes (Signed)
Lunch tray served.

## 2016-05-23 NOTE — Progress Notes (Signed)
Report from Noel, RN 

## 2016-05-23 NOTE — ED Notes (Signed)
Patient is asleep in no apparent distress with easy, unlabored breathing at this time. No issues or concerns this shift. Q15 minute monitoring and security cameras maintained for safety.

## 2016-05-23 NOTE — ED Notes (Signed)
Patient's girlfriend called and states that Patient is not allowed to come back to her house because she has 2 children and she is afraid that they could be in danger , she states that He is psychotic and He really needs help, nurse let Dr, Clearnce Sorrel talk with girlfriend on the phone.

## 2016-05-23 NOTE — ED Notes (Signed)
Spoke to Dr Zenda Alpers to put in psych consult order

## 2016-05-23 NOTE — ED Notes (Signed)
Patient watching tv, no s/s of distress, q 15 min. Checks, and camera surveillance in progress.

## 2016-05-23 NOTE — ED Notes (Signed)
Breakfast tray served, Patient alert and oriented, denies Si/Hi or avh at this time, Patient states that He struggles with depression at times, and He has had very difficult time since His mom died in April 15, 2011, she was His main support system, Patient has financial problems, but is living with friends, states that He cannot work because He has rheumatoid arthritis. Patient did tell nurse that He does hear voices at times and has never been properly treated for this. Patient states that He hopes to get disability. Patient is safe and q 15 min. Checks, and camera surveillance in progress.

## 2016-05-23 NOTE — BHH Counselor (Signed)
This Clinical research associate spoke with pt's girlfriend Liborio Nixon: [phone]) who reports:  "He just called me and he does not need to be discharged. He needs to be admitted into inpatient. I don't want him to harm me. He's gotten aggressive when he's psychotic. Yesterday he thought he was in high school on the band."  Per Dr. Garnetta Buddy, "will not admit pt because he does not meet inpatient criteria". When pt's girlfriend was informed of Psych MD's disposition she reported "well your doctor is wrong".   Pt's girlfriend called [facility] stating that she's an ACTT Team member. Pt's girlfriend called Candice ([employer] Social Worker), Producer, television/film/video (ED Secretary), and [facility] Nurse Toniann Fail) to make these reports that she does not want pt to return to her home. She was informed that she could get a restraining order if she did not feel safe.   Pt's girlfriend spoke directly with Dr. Garnetta Buddy.

## 2016-05-24 MED ORDER — HALOPERIDOL 5 MG PO TABS
ORAL_TABLET | ORAL | Status: AC
  Administered 2016-05-24: 5 mg via ORAL
  Filled 2016-05-24: qty 1

## 2016-05-24 MED ORDER — LORAZEPAM 2 MG PO TABS
2.0000 mg | ORAL_TABLET | Freq: Once | ORAL | Status: AC
  Administered 2016-05-24: 2 mg via ORAL

## 2016-05-24 MED ORDER — LORAZEPAM 1 MG PO TABS
ORAL_TABLET | ORAL | Status: AC
  Filled 2016-05-24: qty 1

## 2016-05-24 MED ORDER — BENZTROPINE MESYLATE 1 MG/ML IJ SOLN
1.0000 mg | Freq: Once | INTRAMUSCULAR | Status: DC
  Filled 2016-05-24: qty 1

## 2016-05-24 MED ORDER — BENZTROPINE MESYLATE 1 MG PO TABS
ORAL_TABLET | ORAL | Status: AC
  Administered 2016-05-24: 16:00:00
  Filled 2016-05-24: qty 1

## 2016-05-24 MED ORDER — LORAZEPAM 2 MG PO TABS
ORAL_TABLET | ORAL | Status: AC
  Filled 2016-05-24: qty 1

## 2016-05-24 MED ORDER — HALOPERIDOL LACTATE 5 MG/ML IJ SOLN: 5.0000 mg | Freq: Once | INTRAMUSCULAR | Status: DC

## 2016-05-24 MED ORDER — HALOPERIDOL 5 MG PO TABS
5.0000 mg | ORAL_TABLET | Freq: Once | ORAL | Status: AC
  Administered 2016-05-24: 5 mg via ORAL

## 2016-05-24 NOTE — ED Provider Notes (Signed)
Patient is sweaty and tachycardic. He says he is seeing things as for Haldol and Ativan. We will do this and recheck him carefully. His pulses 120.  Arnaldo Natal, MD 05/24/16 [phone]

## 2016-05-24 NOTE — ED Notes (Signed)
Pt is aware he is waiting for eval by psych md

## 2016-05-24 NOTE — ED Notes (Signed)
Pt is alert and oriented this evening, watching TV in room. Pt mood is sad/anxious but he is pleasant and cooperative with staff. Pt reports improvement with PRN medications. Pt endorses passive SI but also contracts for safety. SOC completed at bedside and tx plan discussed. 15 minute checks are ongoing for safety.

## 2016-05-24 NOTE — ED Provider Notes (Signed)
-----------------------------------------   7:21 AM on 05/24/2016 -----------------------------------------   Blood pressure 136/90, pulse 95, temperature 98 F (36.7 C), temperature source Oral, resp. rate 19, height 6\' 2"  (1.88 m), weight 214 lb (97.07 kg), SpO2 100 %.  The patient had no acute events since last update.  Calm and cooperative at this time.  Disposition is pending per Psychiatry/Behavioral Medicine team recommendations.     , MD 05/24/16 [phone]

## 2016-05-24 NOTE — ED Notes (Signed)
Patient lying in bed in no apparent distress at this time. No issues to report this shift.Q15 minutes safety check and security cameras maintained.

## 2016-05-24 NOTE — ED Notes (Signed)
Pt states he is feeling better after meds.

## 2016-05-24 NOTE — ED Notes (Signed)
Pt c/o that he is actively hallucinating and is very anxious is slightly diaphoretic, will notify md

## 2016-05-24 NOTE — Progress Notes (Signed)
Writer has consulted with Dr Garnetta Buddy concerning the pts disposition. Dr. Garnetta Buddy has reccommended that the pt be discharged at this time and provided appropriate information for  outpatient follow-up by the behavioral health counselor.  05/24/2016  Cheryl Flash, MS, NCC, LPCA Therapeutic Triage Specialist

## 2016-05-25 DIAGNOSIS — F259 Schizoaffective disorder, unspecified: Secondary | ICD-10-CM

## 2016-05-25 LAB — TSH: TSH: 7.738 u[IU]/mL — AB (ref 0.350–4.500)

## 2016-05-25 LAB — T4, FREE: FREE T4: 0.58 ng/dL — AB (ref 0.61–1.12)

## 2016-05-25 MED ORDER — QUETIAPINE FUMARATE 25 MG PO TABS: 100.0000 mg | ORAL_TABLET | Freq: Every day | ORAL | Status: DC

## 2016-05-25 MED ORDER — POTASSIUM CHLORIDE CRYS ER 20 MEQ PO TBCR
40.0000 meq | EXTENDED_RELEASE_TABLET | Freq: Once | ORAL | Status: AC
  Administered 2016-05-25: 40 meq via ORAL
  Filled 2016-05-25: qty 2

## 2016-05-25 MED ORDER — QUETIAPINE FUMARATE 100 MG PO TABS: 100.0000 mg | ORAL_TABLET | Freq: Every day | ORAL | Status: DC

## 2016-05-25 MED ORDER — OLANZAPINE 5 MG PO TABS
2.5000 mg | ORAL_TABLET | Freq: Four times a day (QID) | ORAL | Status: DC | PRN
  Administered 2016-05-25: 2.5 mg via ORAL
  Filled 2016-05-25: qty 1

## 2016-05-25 NOTE — ED Notes (Signed)
Pt refuses to sign e signature

## 2016-05-25 NOTE — ED Provider Notes (Signed)
-----------------------------------------   6:38 AM on 05/25/2016 -----------------------------------------   Blood pressure 118/77, pulse 101, temperature 98.6 F (37 C), temperature source Oral, resp. rate 16, height 6\' 2"  (1.88 m), weight 214 lb (97.07 kg), SpO2 98 %.  The patient had no acute events since last update.  Calm and cooperative at this time.  Patient was evaluated by [facility] psychiatry who recommends inpatient admission. I have added TSH and free T4 at the recommendation. Oral potassium supplement ordered. Recommendations for Seroquel and PRN Zyprexa are ordered. Disposition per psychiatry.   [facility], MD 05/25/16 [phone]

## 2016-05-25 NOTE — Discharge Instructions (Signed)
Schizoaffective Disorder  Schizoaffective disorder (ScAD) is a mental illness. It causes symptoms that are a mixture of schizophrenia (a psychotic disorder) and an affective (mood) disorder. The schizophrenic symptoms may include delusions, hallucinations, or odd behavior. The mood symptoms may be similar to major depression or bipolar disorder. ScAD may interfere with personal relationships or normal daily activities. People with ScAD are at increased risk for job loss, social isolation, physical health problems, anxiety and substance use disorders, and suicide.  ScAD usually occurs in cycles. Periods of severe symptoms are followed by periods of  less severe symptoms or improvement. The illness affects men and women equally but usually appears at an earlier age (teenage or early adult years) in men. People who have family members with schizophrenia, bipolar disorder, or ScAD are at higher risk of developing ScAD.  SYMPTOMS   At any one time, people with ScAD may have psychotic symptoms only or both psychotic and mood symptoms. The psychotic symptoms include one or more of the following:  · Hearing, seeing, or feeling things that are not there (hallucinations).    · Having fixed, false beliefs (delusions). The delusions usually are of being attacked, harassed, cheated, persecuted, or conspired against (paranoid delusions).  · Speaking in a way that makes no sense to others (disorganized speech).  The psychotic symptoms of ScAD may also include confusing or odd behavior or any of the negative symptoms of schizophrenia. These include loss of motivation for normal daily activities, such as bathing or grooming, withdrawal from other people, and lack of emotions.     The mood symptoms of ScAD occur more often than not. They resemble major depressive disorder or bipolar mania. Symptoms of major depression include depressed mood and four or more of the following:  · Loss of interest in usually pleasurable activities  (anhedonia).  · Sleeping more or less than normal.  · Feeling worthless or excessively guilty.  · Lack of energy or motivation.  · Trouble concentrating.  · Eating more or less than usual.  · Thinking a lot about death or suicide.  Symptoms of bipolar mania include abnormally elevated or irritable mood and increased energy or activity, plus three or more of the following:    · More confidence than normal or feeling that you are able to do anything (grandiosity).  · Feeling rested with less sleep than normal.    · Being easily distracted.    · Talking more than usual or feeling pressured to keep talking.    · Feeling that your thoughts are racing.  · Engaging in high-risk activities such as buying sprees or foolish business decisions.  DIAGNOSIS   ScAD is diagnosed through an assessment by your health care provider. Your health care provider will observe and ask questions about your thoughts, behavior, mood, and ability to function in daily life. Your health care provider may also ask questions about your medical history and use of drugs, including prescription medicines. Your health care provider may also order blood tests and imaging exams. Certain medical conditions and substances can cause symptoms that resemble ScAD. Your health care provider may refer you to a mental health specialist for evaluation.   ScAD is divided into two types. The depressive type is diagnosed if your mood symptoms are limited to major depression. The bipolar type is diagnosed if your mood symptoms are manic or a mixture of manic and depressive symptoms  TREATMENT   ScAD is usually a lifelong illness. Long-term treatment is necessary. The following treatments are available:  · Medicine. Different types of   medicine are used to treat ScAD. The exact combination depends on the type and severity of your symptoms. Antipsychotic medicine is used to control psychotic symptoms such as delusions, paranoia, and hallucinations. Mood stabilizers can  even the highs and lows of bipolar manic mood swings. Antidepressant medicines are used to treat major depressive symptoms.  · Counseling or talk therapy. Individual, group, or family counseling may be helpful in providing education, support, and guidance. Many people with ScAD also benefit from social skills and job skills (vocational) training.  A combination of medicine and counseling is usually best for managing the disorder over time. A procedure in which electricity is applied to the brain through the scalp (electroconvulsive therapy) may be used to treat people with severe manic symptoms that do not respond to medicine and counseling.  HOME CARE INSTRUCTIONS   · Take all your medicine as prescribed.  · Check with your health care provider before starting new prescription or over-the-counter medicines.  · Keep all follow up appointments with your health care provider.  SEEK MEDICAL CARE IF:   · If you are not able to take your medicines as prescribed.  · If your symptoms get worse.  SEEK IMMEDIATE MEDICAL CARE IF:   · You have serious thoughts about hurting yourself or others.     This information is not intended to replace advice given to you by your health care provider. Make sure you discuss any questions you have with your health care provider.     Document Released: 04/26/2007 Document Revised: 01/04/2015 Document Reviewed: 07/28/2013  Elsevier Interactive Patient Education ©2016 Elsevier Inc.

## 2016-05-25 NOTE — Consult Note (Signed)
[facility] Face-to-Face Psychiatry Consult   Reason for Consult:  Drug use Referring Physician:  EDP Patient Identification: Curtis Cobb MRN:  [id number] Principal Diagnosis: Substance-induced psychotic disorder Diagnosis:   Patient Active Problem List   Diagnosis Date Noted  . Sedative, hypnotic or anxiolytic use disorder, severe, dependence (HCC) [F13.20] 03/30/2016  . Drug overdose, multiple drugs [T50.901A] 03/25/2016  . Opioid use disorder, mild, in sustained remission [F11.90] 10/08/2015  . 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 Alcohol use disorder, severe, dependence (HCC) [F10.20] 10/07/2015  . Cannabis use disorder, severe, dependence (HCC) [F12.20] 10/07/2015  . 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 HTN (hypertension) [I10] 10/07/2015  . Rheumatoid arthritis (HCC) [M06.9] 10/07/2015    Total Time spent with patient: 30 minutes  Subjective:   Curtis Cobb is a 45 y.o. male patient admitted with worsening of the paranoia.  HPI:    Curtis Cobb is an 45 y.o. male initially presenting to the ED for bilateral joint pain. However, according to pt's girlfriend, pt is paranoid, and experiencing hallucinations and confusion. Pt Was seen for follow-up. He reported that he is not experiencing any hallucinations at this time. He reported that he has been compliant with his medications and he was evaluated by the tele psychiatry yesterday. They have adjusted his medications and he is feeling more comfortable. He reported that the Seroquel is helping him. He currently denied having any perceptual disturbances. He reported that he follows with Dr. Omelia Blackwater in [place] on a regular basis. Patient currently denied having any suicidal ideations or plans. Patient reported that he is willing to go back to his girlfriend. Advised patient that his girlfriend might not welcoming him at this time. And he needs to discuss with the behavioral  health about his disposition. He demonstrated understanding. He currently denied having any perceptual disturbances. He appears somewhat apprehensive about discharge planning but he was open to discussion.  Patient does not have any suicidal ideations or plans.     Past Psychiatric History: Patient has had multiple presentations to emergency rooms and a lot of psychiatric treatment in the past. He says that the only hospitalizations he's had in his mind were related directly to substance abuse. He's been struggling with substance abuse problems since he was a young adolescent. Longest sobriety was for a year. Usually uses a lot of alcohol but has not had delirium tremens. Says he's been drinking less than usual recently and using more stimulants. He has been prescribed Celexa gabapentin and buspirone from the last time he was at a hospital in [place] but is noncompliant with them. Denies he's ever tried to seriously kill himself denies any history of violence.    Risk to Self: Suicidal Ideation: No Suicidal Intent: No Is patient at risk for suicide?: No Suicidal Plan?: No Access to Means: Yes Specify Access to Suicidal Means: Pt has access to drugs What has been your use of drugs/alcohol within the last 12 months?: Pt has a prior hx of alcohol and cannabis abuse How many times?: 1 Other Self Harm Risks: None identified Triggers for Past Attempts: None known, Other (Comment) (substance induced) Intentional Self Injurious Behavior: None Risk to Others: Homicidal Ideation: No Thoughts of Harm to Others: No Current Homicidal Intent: No Current Homicidal Plan: No Access to Homicidal Means: No Identified Victim: None identified History of harm to others?: No Assessment of Violence: None Noted Violent Behavior Description: None identified Does patient have access to weapons?: No Criminal  Charges Pending?: No Does patient have a court date: No Prior Inpatient Therapy: Prior Inpatient  Therapy: Yes Prior Therapy Dates: 03/2016 Prior Therapy Facilty/Provider(s): [facility] Reason for Treatment: alcohol Prior Outpatient Therapy: Prior Outpatient Therapy: No Prior Therapy Dates: None reported Prior Therapy Facilty/Provider(s): None reported Reason for Treatment: None reported Does patient have an ACCT team?: No Does patient have Intensive In-House Services?  : No Does patient have [facility] services? : No Does patient have P4CC services?: No  Past Medical History:  Past Medical History  Diagnosis Date  . Bipolar 1 disorder (HCC)   . Schizophrenia (HCC)   . Depression   . Anxiety   . Rheumatoid arthritis (HCC)   . Hypertension   . Schizoaffective disorder, depressive type (HCC) 10/06/2015  . Alcohol use disorder, moderate, dependence (HCC) 02/13/2014   History reviewed. No pertinent past surgical history. Family History:  Family History  Problem Relation Age of Onset  . Family history unknown: Yes   Family Psychiatric  History: none reported  Social History:  History  Alcohol Use  . Yes    Comment: gallon/day     History  Drug Use  . Yes  . Special: Amphetamines    Comment: THC    Social History   Social History  . Marital Status: Single    Spouse Name: N/A  . Number of Children: N/A  . Years of Education: N/A   Social History Main Topics  . Smoking status: Former Smoker -- 0.00 packs/day for 0 years  . Smokeless tobacco: None  . Alcohol Use: Yes     Comment: gallon/day  . Drug Use: Yes    Special: Amphetamines     Comment: THC  . Sexual Activity: Yes    Birth Control/ Protection: None   Other Topics Concern  . None   Social History Narrative   Additional Social History:   Lives with a girlfriend. He is currently unemployed Allergies:   Allergies  Allergen Reactions  . Sulfa Antibiotics Other (See Comments)    Unknown reaction    Labs:  Results for orders placed or performed during the hospital encounter of 05/22/16 (from the past 48  hour(s))  TSH     Status: Abnormal   Collection Time: 05/24/16  6:58 PM  Result Value Ref Range   TSH 7.738 (H) 0.350 - 4.500 uIU/mL  T4, free     Status: Abnormal   Collection Time: 05/24/16  6:58 PM  Result Value Ref Range   Free T4 0.58 (L) 0.61 - 1.12 ng/dL    Current Facility-Administered Medications  Medication Dose Route Frequency Provider Last Rate Last Dose  . benztropine mesylate (COGENTIN) injection 1 mg  1 mg Intramuscular Once Jeanmarie Plant, MD   1 mg at 05/24/16 1823  . busPIRone (BUSPAR) tablet 5 mg  5 mg Oral BID Brandy Hale, MD   5 mg at 05/25/16 0911  . QUEtiapine (SEROQUEL) tablet 100 mg  100 mg Oral QHS Irean Hong, MD       Current Outpatient Prescriptions  Medication Sig Dispense Refill  . amitriptyline (ELAVIL) 100 MG tablet Take 1 tablet (100 mg total) by mouth at bedtime. 30 tablet 0  . predniSONE (DELTASONE) 5 MG tablet Take 1 tablet (5 mg total) by mouth daily with breakfast.    . triamcinolone cream (KENALOG) 0.1 % Apply topically 2 (two) times daily. 30 g 0    Musculoskeletal: Strength & Muscle Tone: within normal limits Gait & Station: normal Patient leans:  N/A  Psychiatric Specialty Exam: Physical Exam   Review of Systems  Musculoskeletal: Positive for myalgias and joint pain.  Psychiatric/Behavioral: Positive for depression. The patient is nervous/anxious and has insomnia.     Blood pressure 118/77, pulse 101, temperature 98.6 F (37 C), temperature source Oral, resp. rate 16, height 6\' 2"  (1.88 m), weight 214 lb (97.07 kg), SpO2 98 %.Body mass index is 27.46 kg/(m^2).  General Appearance: Casual  Eye Contact:  Fair  Speech:  Clear and Coherent  Volume:  Normal  Mood:  Anxious  Affect:  Congruent  Thought Process:  Coherent and Goal Directed  Orientation:  Full (Time, Place, and Person)  Thought Content:  Logical  Suicidal Thoughts:  No  Homicidal Thoughts:  No  Memory:  Immediate;   Fair Recent;   Fair Remote;   Fair  Judgement:   Fair  Insight:  Fair  Psychomotor Activity:  Normal  Concentration:  Concentration: Fair and Attention Span: Fair  Recall:  of Knowledge:  Fair  Language:  Fair  Akathisia:  No  Handed:  Right  AIMS (if indicated):     Assets:  Communication Skills Desire for Improvement Physical Health Talents/Skills  ADL's:  Intact  Cognition:  WNL  Sleep:        Treatment Plan Summary: Daily contact with patient to assess and evaluate symptoms and progress in treatment and Medication management  Disposition: Patient does not meet criteria for psychiatric inpatient admission. Supportive therapy provided about ongoing stressors. Discussed crisis plan, support from social network, calling 911, coming to the Emergency Department, and calling Suicide Hotline.   Patient will be discharged at he does not meet the criteria for involuntary commitment Continue Seroquel 100 mg at bedtime. Continue BuSpar 5 mg by mouth twice a day Will follow-up with Dr. Fiserv in [place]   Thank you for allowing me to participate in the care of this patient  [place], MD 05/25/2016 12:04 PM

## 2016-05-25 NOTE — Progress Notes (Addendum)
Per request of (Dr. Garnetta Buddy ), writer provided the pt. with information and instructions on how to access Outpatient Mental Health & Substance Abuse Treatment ([facility] [employer]). Pt has been provided a  Marketing executive. Pt has also sopken with writer about the need for follow up and been giving a cab voucher to assist with transportation.   05/25/2016  Cheryl Flash, MS, NCC, LPCA Therapeutic Triage Specialist

## 2016-05-25 NOTE — ED Notes (Signed)
Pt is dc to self he is given cab voucher by TTS

## 2016-05-25 NOTE — ED Notes (Signed)
Pt angry about dc says he has no money to buy rx and has no where to go , he can not go to shelter because he has no ID, cab will be provided to [facility] where he says he can call someone ?

## 2016-05-25 NOTE — ED Provider Notes (Signed)
-----------------------------------------   3:38 PM on 05/25/2016 -----------------------------------------   Blood pressure 126/80, pulse 88, temperature 98.6 F (37 C), temperature source Oral, resp. rate 20, height 6\' 2"  (1.88 m), weight 214 lb (97.07 kg), SpO2 99 %.  Assuming care from Dr. .  In short, Curtis Cobb is a 45 y.o. male with a chief complaint of Hallucinations; Paranoid; and Psychiatric Evaluation .  Refer to the original H&P for additional details.  The current plan of care is to *discharged the patient The patient has been seen and evaluated by psychiatry and TTS and has arrangements for outpatient management. The patient will be discharged on Seroquel daily at bedtime. Appropriate outpatient follow-up is been arranged.   45, MD 05/25/16 1539

## 2016-06-02 ENCOUNTER — Encounter (INDEPENDENT_AMBULATORY_CARE_PROVIDER_SITE_OTHER): Payer: Self-pay

## 2016-06-02 ENCOUNTER — Ambulatory Visit: Payer: No Typology Code available for payment source

## 2016-07-09 ENCOUNTER — Emergency Department
Admission: EM | Admit: 2016-07-09 | Discharge: 2016-07-10 | Disposition: A | Discharge status: Other Acute Inpatient Hospital | Payer: Medicaid Other | Attending: Emergency Medicine | Admitting: Emergency Medicine

## 2016-07-09 ENCOUNTER — Encounter: Payer: Self-pay | Admitting: Emergency Medicine

## 2016-07-09 DIAGNOSIS — M069 Rheumatoid arthritis, unspecified: Secondary | ICD-10-CM | POA: Insufficient documentation

## 2016-07-09 DIAGNOSIS — R45851 Suicidal ideations: Secondary | ICD-10-CM | POA: Insufficient documentation

## 2016-07-09 DIAGNOSIS — F1910 Other psychoactive substance abuse, uncomplicated: Secondary | ICD-10-CM | POA: Insufficient documentation

## 2016-07-09 DIAGNOSIS — F333 Major depressive disorder, recurrent, severe with psychotic symptoms: Secondary | ICD-10-CM

## 2016-07-09 DIAGNOSIS — F1020 Alcohol dependence, uncomplicated: Secondary | ICD-10-CM | POA: Diagnosis present

## 2016-07-09 DIAGNOSIS — I10 Essential (primary) hypertension: Secondary | ICD-10-CM | POA: Insufficient documentation

## 2016-07-09 DIAGNOSIS — Z79899 Other long term (current) drug therapy: Secondary | ICD-10-CM | POA: Insufficient documentation

## 2016-07-09 DIAGNOSIS — F329 Major depressive disorder, single episode, unspecified: Secondary | ICD-10-CM | POA: Insufficient documentation

## 2016-07-09 DIAGNOSIS — Z87891 Personal history of nicotine dependence: Secondary | ICD-10-CM | POA: Insufficient documentation

## 2016-07-09 DIAGNOSIS — F32A Depression, unspecified: Secondary | ICD-10-CM

## 2016-07-09 DIAGNOSIS — F332 Major depressive disorder, recurrent severe without psychotic features: Secondary | ICD-10-CM | POA: Diagnosis present

## 2016-07-09 DIAGNOSIS — F251 Schizoaffective disorder, depressive type: Secondary | ICD-10-CM | POA: Insufficient documentation

## 2016-07-09 LAB — URINE DRUG SCREEN, QUALITATIVE (ARMC ONLY)
Amphetamines, Ur Screen: NOT DETECTED
Barbiturates, Ur Screen: NOT DETECTED
Benzodiazepine, Ur Scrn: NOT DETECTED
CANNABINOID 50 NG, UR ~~LOC~~: NOT DETECTED
COCAINE METABOLITE, UR ~~LOC~~: NOT DETECTED
MDMA (ECSTASY) UR SCREEN: NOT DETECTED
Methadone Scn, Ur: NOT DETECTED
Opiate, Ur Screen: NOT DETECTED
Phencyclidine (PCP) Ur S: NOT DETECTED
TRICYCLIC, UR SCREEN: POSITIVE — AB

## 2016-07-09 LAB — COMPREHENSIVE METABOLIC PANEL
ALBUMIN: 4.4 g/dL (ref 3.5–5.0)
ALK PHOS: 75 U/L (ref 38–126)
ALT: 17 U/L (ref 17–63)
AST: 25 U/L (ref 15–41)
Anion gap: 9 (ref 5–15)
BILIRUBIN TOTAL: 0.9 mg/dL (ref 0.3–1.2)
BUN: 9 mg/dL (ref 6–20)
CALCIUM: 9.1 mg/dL (ref 8.9–10.3)
CO2: 20 mmol/L — AB (ref 22–32)
CREATININE: 1.2 mg/dL (ref 0.61–1.24)
Chloride: 110 mmol/L (ref 101–111)
GFR calc non Af Amer: >60 mL/min (ref >60)
GLUCOSE: 114 mg/dL — AB (ref 65–99)
Potassium: 3.3 mmol/L — ABNORMAL LOW (ref 3.5–5.1)
SODIUM: 139 mmol/L (ref 135–145)
Total Protein: 8 g/dL (ref 6.5–8.1)

## 2016-07-09 LAB — CBC
HEMATOCRIT: 44.6 % (ref 40.0–52.0)
HEMOGLOBIN: 15 g/dL (ref 13.0–18.0)
MCH: 29 pg (ref 26.0–34.0)
MCHC: 33.7 g/dL (ref 32.0–36.0)
MCV: 86.1 fL (ref 80.0–100.0)
Platelets: 350 10*3/uL (ref 150–440)
RBC: 5.18 MIL/uL (ref 4.40–5.90)
RDW: 14.8 % — ABNORMAL HIGH (ref 11.5–14.5)
WBC: 10.6 10*3/uL (ref 3.8–10.6)

## 2016-07-09 LAB — SALICYLATE LEVEL: Salicylate Lvl: <4 mg/dL (ref 2.8–30.0)

## 2016-07-09 LAB — ACETAMINOPHEN LEVEL: Acetaminophen (Tylenol), Serum: <10 ug/mL — ABNORMAL LOW (ref 10–30)

## 2016-07-09 LAB — ETHANOL: Alcohol, Ethyl (B): <5 mg/dL (ref <5)

## 2016-07-09 MED ORDER — QUETIAPINE FUMARATE 300 MG PO TABS
300.0000 mg | ORAL_TABLET | Freq: Every day | ORAL | Status: DC
Start: 2016-07-09 — End: 2016-07-10
  Administered 2016-07-09: 300 mg via ORAL
  Filled 2016-07-09: qty 1

## 2016-07-09 NOTE — ED Notes (Signed)
Report given to Amy H, RN.

## 2016-07-09 NOTE — ED Notes (Signed)
IVC/Consult completed /pending admission

## 2016-07-09 NOTE — Consult Note (Signed)
[facility] Face-to-Face Psychiatry Consult   Reason for Consult:  Consult for this 45 year old man with a history of recurrent symptoms of depression and psychotic symptoms as well as substance abuse Referring Physician:  Cinda Quest Patient Identification: Curtis Cobb MRN:  [id number] Principal Diagnosis: MDD (major depressive disorder), recurrent episode, severe ([place]) Diagnosis:   Patient Active Problem List   Diagnosis Date Noted  . Suicidal ideation [R45.851] 07/09/2016  . Schizoaffective disorder ([place]) [F25.9]   . Sedative, hypnotic or anxiolytic use disorder, severe, dependence ([place]) [F13.20] 03/30/2016  . Drug overdose, multiple drugs [T50.901A] 03/25/2016  . MDD (major depressive disorder), recurrent episode, severe ([place]) [F33.2] 11/17/2015  . Opioid use disorder, mild, in sustained remission [F11.90] 10/08/2015  . Substance-induced psychotic disorder with delusions ([place]) [F19.950] 10/08/2015  . Alcohol use disorder, severe, dependence ([place]) [F10.20] 10/07/2015  . Cannabis use disorder, severe, dependence ([place]) [F12.20] 10/07/2015  . 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 HTN (hypertension) [I10] 10/07/2015  . Rheumatoid arthritis ([place]) [M06.9] 10/07/2015    Total Time spent with patient: 1 hour  Subjective:   Curtis Cobb is a 45 y.o. male patient admitted with "I've been wanting to kill myself".  HPI:  Patient interviewed. Chart reviewed. Old chart reviewed. Vitals and labs reviewed. 45 year old man presented to the hospital stating that his depression is much worse and that he is having active suicidal thoughts. This is part of a long-standing pattern of mood instability and intermittent psychosis. He says that he has not slept in about 3 or 4 days. For the last week he has been feeling so paranoid that he had to leave the homeless shelter because he thought everyone was out to get him. He had been having auditory hallucinations. Today he is not having hallucinations.  Yesterday however he drank rubbing alcohol in what he says was an attempt at suicide. He cannot remember how much alcohol he drank. Says that he hasn't had any regular alcohol in several days. Asian feels like his mood is bad. Feels worthless and depressed all the time. He denies that he's been abusing any other drugs. He has not been compliant with his prescribed outpatient psychiatric medicine. He stopped at a little over a week ago.  Social history: Homeless. Recently left the shelter. Does not get disability. Little support in the community. Also his girlfriend who used to be very supportive of him has now finally put him out. He tells me he is very angry about that and having homicidal thoughts about it.  Medical history: Currently blood pressure is high. History of hypertension and arthritis but not on any medicine.  Substance abuse history: Long history of severe alcohol abuse with abuse of at times extremely large amounts of alcohol. Judging from the chart it looks like he has started to moderate that'll little bit at least compared to how bad it was a year or more ago. Also has a long history of opiate addiction. He tells me he thinks the only time in his life he ever felt well was when he was abusing opiates. Documented history of abusing multiple other drugs as well. Patient tends to minimize the degree to which substances contributed to his problems.  Past Psychiatric History: Multiple hospitalizations. Multiple emergency room visits. Very often his symptoms seemed to resolve once he sobers up. At times continues to have depression even when not abusing drugs. Has been prescribed multiple medicines. Thought that Seroquel and Elavil might of been of some help. Patient reports that he's  had multiple suicide attempts in the past and this is supported by documentation. Minimizes violence although now he is saying he has violent thoughts towards his ex-girlfriend.  Risk to Self: Is patient at risk  for suicide?: Yes Risk to Others:   Prior Inpatient Therapy:   Prior Outpatient Therapy:    Past Medical History:  Past Medical History  Diagnosis Date  . Bipolar 1 disorder ([place])   . Schizophrenia ([place])   . Depression   . Anxiety   . Rheumatoid arthritis ([place])   . Hypertension   . Schizoaffective disorder, depressive type ([place]) 10/06/2015  . Alcohol use disorder, moderate, dependence ([place]) 02/13/2014   History reviewed. No pertinent past surgical history. Family History:  Family History  Problem Relation Age of Onset  . Family history unknown: Yes   Family Psychiatric  History: Patient reports that multiple people on his mother's side of the family including his mother and his grandfather both had mental health problems and that there was a large history of substance abuse disorder Social History:  History  Alcohol Use  . Yes    Comment: Daily-Drank rubbing alcohol today     History  Drug Use  . Yes  . Special: Amphetamines    Comment: THC-Past    Social History   Social History  . Marital Status: Single    Spouse Name: N/A  . Number of Children: N/A  . Years of Education: N/A   Social History Main Topics  . Smoking status: Former Smoker -- 0.00 packs/day for 0 years  . Smokeless tobacco: None  . Alcohol Use: Yes     Comment: Daily-Drank rubbing alcohol today  . Drug Use: Yes    Special: Amphetamines     Comment: THC-Past  . Sexual Activity: Yes    Birth Control/ Protection: None   Other Topics Concern  . None   Social History Narrative   Additional Social History:    Allergies:   Allergies  Allergen Reactions  . Sulfa Antibiotics Other (See Comments)    Unknown reaction    Labs:  Results for orders placed or performed during the hospital encounter of 07/09/16 (from the past 48 hour(s))  Comprehensive metabolic panel     Status: Abnormal   Collection Time: 07/09/16 11:25 AM  Result Value Ref Range   Sodium 139 135 - 145 mmol/L   Potassium 3.3  (L) 3.5 - 5.1 mmol/L   Chloride 110 101 - 111 mmol/L   CO2 20 (L) 22 - 32 mmol/L   Glucose, Bld 114 (H) 65 - 99 mg/dL   BUN 9 6 - 20 mg/dL   Creatinine, Ser 1.20 0.61 - 1.24 mg/dL   Calcium 9.1 8.9 - 10.3 mg/dL   Total Protein 8.0 6.5 - 8.1 g/dL   Albumin 4.4 3.5 - 5.0 g/dL   AST 25 15 - 41 U/L   ALT 17 17 - 63 U/L   Alkaline Phosphatase 75 38 - 126 U/L   Total Bilirubin 0.9 0.3 - 1.2 mg/dL   GFR calc non Af Amer >60 >60 mL/min   GFR calc Af Amer >60 >60 mL/min    Comment: (NOTE) The eGFR has been calculated using the CKD EPI equation. This calculation has not been validated in all clinical situations. eGFR's persistently <60 mL/min signify possible Chronic Kidney Disease.    Anion gap 9 5 - 15  Ethanol     Status: None   Collection Time: 07/09/16 11:25 AM  Result Value Ref Range  Alcohol, Ethyl (B) <5 <5 mg/dL    Comment:        LOWEST DETECTABLE LIMIT FOR SERUM ALCOHOL IS 5 mg/dL FOR MEDICAL PURPOSES ONLY   Salicylate level     Status: None   Collection Time: 07/09/16 11:25 AM  Result Value Ref Range   Salicylate Lvl <8.2 2.8 - 30.0 mg/dL  Acetaminophen level     Status: Abnormal   Collection Time: 07/09/16 11:25 AM  Result Value Ref Range   Acetaminophen (Tylenol), Serum <10 (L) 10 - 30 ug/mL    Comment:        THERAPEUTIC CONCENTRATIONS VARY SIGNIFICANTLY. A RANGE OF 10-30 ug/mL MAY BE AN EFFECTIVE CONCENTRATION FOR MANY PATIENTS. HOWEVER, SOME ARE BEST TREATED AT CONCENTRATIONS OUTSIDE THIS RANGE. ACETAMINOPHEN CONCENTRATIONS >150 ug/mL AT 4 HOURS AFTER INGESTION AND >50 ug/mL AT 12 HOURS AFTER INGESTION ARE OFTEN ASSOCIATED WITH TOXIC REACTIONS.   cbc     Status: Abnormal   Collection Time: 07/09/16 11:25 AM  Result Value Ref Range   WBC 10.6 3.8 - 10.6 K/uL   RBC 5.18 4.40 - 5.90 MIL/uL   Hemoglobin 15.0 13.0 - 18.0 g/dL   HCT 44.6 40.0 - 52.0 %   MCV 86.1 80.0 - 100.0 fL   MCH 29.0 26.0 - 34.0 pg   MCHC 33.7 32.0 - 36.0 g/dL   RDW 14.8 (H) 11.5  - 14.5 %   Platelets 350 150 - 440 K/uL  Urine Drug Screen, Qualitative     Status: Abnormal   Collection Time: 07/09/16 12:30 PM  Result Value Ref Range   Tricyclic, Ur Screen POSITIVE (A) NONE DETECTED   Amphetamines, Ur Screen NONE DETECTED NONE DETECTED   MDMA (Ecstasy)Ur Screen NONE DETECTED NONE DETECTED   Cocaine Metabolite,Ur [place] NONE DETECTED NONE DETECTED   Opiate, Ur Screen NONE DETECTED NONE DETECTED   Phencyclidine (PCP) Ur S NONE DETECTED NONE DETECTED   Cannabinoid 50 Ng, Ur [place] NONE DETECTED NONE DETECTED   Barbiturates, Ur Screen NONE DETECTED NONE DETECTED   Benzodiazepine, Ur Scrn NONE DETECTED NONE DETECTED   Methadone Scn, Ur NONE DETECTED NONE DETECTED    Comment: (NOTE) 993  Tricyclics, urine               Cutoff 1000 ng/mL 200  Amphetamines, urine             Cutoff 1000 ng/mL 300  MDMA (Ecstasy), urine           Cutoff 500 ng/mL 400  Cocaine Metabolite, urine       Cutoff 300 ng/mL 500  Opiate, urine                   Cutoff 300 ng/mL 600  Phencyclidine (PCP), urine      Cutoff 25 ng/mL 700  Cannabinoid, urine              Cutoff 50 ng/mL 800  Barbiturates, urine             Cutoff 200 ng/mL 900  Benzodiazepine, urine           Cutoff 200 ng/mL 1000 Methadone, urine                Cutoff 300 ng/mL 1100 1200 The urine drug screen provides only a preliminary, unconfirmed 1300 analytical test result and should not be used for non-medical 1400 purposes. Clinical consideration and professional judgment should 1500 be applied to any positive drug screen result due to possible 1600  interfering substances. A more specific alternate chemical method 1700 must be used in order to obtain a confirmed analytical result.  1800 Gas chromato graphy / mass spectrometry (GC/MS) is the preferred 1900 confirmatory method.     Current Facility-Administered Medications  Medication Dose Route Frequency Provider Last Rate Last Dose  . QUEtiapine (SEROQUEL) tablet 300 mg  300 mg  Oral QHS Gonzella Lex, MD       Current Outpatient Prescriptions  Medication Sig Dispense Refill  . amitriptyline (ELAVIL) 100 MG tablet Take 1 tablet (100 mg total) by mouth at bedtime. 30 tablet 0  . QUEtiapine (SEROQUEL) 100 MG tablet Take 1 tablet (100 mg total) by mouth at bedtime. 30 tablet 1    Musculoskeletal: Strength & Muscle Tone: within normal limits Gait & Station: normal Patient leans: N/A  Psychiatric Specialty Exam: Physical Exam  Nursing note and vitals reviewed. Constitutional: He appears well-developed and well-nourished.  HENT:  Head: Normocephalic and atraumatic.  Eyes: Conjunctivae are normal. Pupils are equal, round, and reactive to light.  Neck: Normal range of motion.  Cardiovascular: Regular rhythm and normal heart sounds.   Respiratory: Effort normal. No respiratory distress.  GI: Soft.  Musculoskeletal: Normal range of motion.  Neurological: He is alert.  Skin: Skin is warm and dry.  Psychiatric: His mood appears anxious. His affect is labile. His speech is delayed. He is agitated. Thought content is paranoid. He expresses impulsivity and inappropriate judgment. He exhibits a depressed mood. He expresses homicidal and suicidal ideation. He expresses suicidal plans and homicidal plans. He exhibits abnormal recent memory and abnormal remote memory.    Review of Systems  Constitutional: Negative.   HENT: Negative.   Eyes: Negative.   Respiratory: Negative.   Cardiovascular: Negative.   Gastrointestinal: Negative.   Musculoskeletal: Negative.   Skin: Negative.   Neurological: Negative.   Psychiatric/Behavioral: Positive for depression, suicidal ideas, hallucinations, memory loss and substance abuse. The patient is nervous/anxious and has insomnia.     Blood pressure 159/104, pulse 108, temperature 98.8 F (37.1 C), temperature source Oral, resp. rate 20, height 6' 2"  (1.88 m), weight 92.987 kg (205 lb), SpO2 99 %.Body mass index is 26.31 kg/(m^2).   General Appearance: Disheveled  Eye Contact:  Good  Speech:  Normal Rate  Volume:  Normal  Mood:  Anxious and Depressed  Affect:  Depressed and Labile  Thought Process:  Goal Directed  Orientation:  Full (Time, Place, and Person)  Thought Content:  Paranoid Ideation and Rumination  Suicidal Thoughts:  Yes.  with intent/plan  Homicidal Thoughts:  Yes.  with intent/plan  Memory:  Immediate;   Good Recent;   Fair Remote;   Poor  Judgement:  Poor  Insight:  Shallow  Psychomotor Activity:  Restlessness  Concentration:  Concentration: Poor  Recall:  Poor  Fund of Knowledge:  Fair  Language:  Fair  Akathisia:  No  Handed:  Right  AIMS (if indicated):     Assets:  Communication Skills Desire for Improvement  ADL's:  Intact  Cognition:  Impaired,  Mild  Sleep:        Treatment Plan Summary: Daily contact with patient to assess and evaluate symptoms and progress in treatment, Medication management and Plan 45 year old man with recurrent depression. Unclear whether it is primarily substance induced or part of a mood disorder or psychotic disorder. History of noncompliance with treatment. Currently with active suicidal and homicidal ideation. Patient will be admitted to the psychiatric ward. If he is not already  on commitment papers I am going to file them given what he is saying and his history of labile behavior. Restart Seroquel at 300 mg at night. Case reviewed with emergency room staff.  Disposition: Recommend psychiatric Inpatient admission when medically cleared. Supportive therapy provided about ongoing stressors.  Alethia Berthold, MD 07/09/2016 5:13 PM

## 2016-07-09 NOTE — ED Notes (Addendum)
Pt complains of suicidal thoughts for one week. Pt states he does have a plan but doesn't want to talk about it and states that something is going on but can't talk about that either. Pt states he has been staying at the shelter but left and is homeless. Pt states he can't keep it together anymore. Pt is brought in by Police with IVC paperwork. Pt states he does drink alcohol daily and has been drinking rubbing alcohol.

## 2016-07-09 NOTE — BH Assessment (Signed)
Assessment Note  Curtis Cobb is an 45 y.o. male presenting to the ED with concerns of worsening depression and suicidal ideations. He says that he has not slept in about 3 or 4 days. He reports feeling paranoid to the point of leaving the homeless shelter because he thought everyone was out to get him. Today he is not having hallucinations. Yesterday however he drank rubbing alcohol in what he says was an attempt at suicide. He cannot remember how much alcohol he drank. Says that he hasn't had any regular alcohol in several days. He reports feeling worthless and depressed all the time. He denies that he's been abusing any other drugs. He has not been compliant with his prescribed outpatient psychiatric medicine.   Diagnosis: Major Depression  Past Medical History:  Past Medical History  Diagnosis Date  . Bipolar 1 disorder ([facility])   . Schizophrenia ([facility])   . Depression   . Anxiety   . Rheumatoid arthritis ([facility])   . Hypertension   . Schizoaffective disorder, depressive type ([facility]) 10/06/2015  . Alcohol use disorder, moderate, dependence ([facility]) 02/13/2014    History reviewed. No pertinent past surgical history.  Family History:  Family History  Problem Relation Age of Onset  . Family history unknown: Yes    Social History:  reports that he has quit smoking. He does not have any smokeless tobacco history on file. He reports that he drinks alcohol. He reports that he uses illicit drugs (Amphetamines).  Additional Social History:  Alcohol / Drug Use History of alcohol / drug use?: Yes Negative Consequences of Use: Personal relationships, Work / School  CIWA: CIWA-Ar BP: 139/84 mmHg Pulse Rate: (!) 102 COWS:    Allergies:  Allergies  Allergen Reactions  . Sulfa Antibiotics Other (See Comments)    Unknown reaction    Home Medications:  (Not in a hospital admission)  OB/GYN Status:  No LMP for male patient.  General Assessment Data Location of Assessment: [facility] ED TTS  Assessment: In system Is this a Tele or Face-to-Face Assessment?: Face-to-Face Is this an Initial Assessment or a Re-assessment for this encounter?: Initial Assessment Marital status: Single Maiden name: na Is patient pregnant?: No Pregnancy Status: No Living Arrangements: Other (Comment) (homeless) Can pt return to current living arrangement?: Yes Admission Status: Involuntary Is patient capable of signing voluntary admission?: Yes Referral Source: Self/Family/Friend Insurance type: self pay  Medical Screening Exam [facility] Walk-in ONLY) Medical Exam completed: Yes  Crisis Care Plan Living Arrangements: Other (Comment) (homeless) Legal Guardian: Other: (self) Name of Psychiatrist: None reported Name of Therapist: None reported  Education Status Is patient currently in school?: No Current Grade: na Highest grade of school patient has completed: N/A Name of school: N/A Contact person: N/A  Risk to self with the past 6 months Suicidal Ideation: Yes-Currently Present Has patient been a risk to self within the past 6 months prior to admission? : No Suicidal Intent: Yes-Currently Present Has patient had any suicidal intent within the past 6 months prior to admission? : No Is patient at risk for suicide?: Yes Suicidal Plan?: Yes-Currently Present Has patient had any suicidal plan within the past 6 months prior to admission? : No Specify Current Suicidal Plan: Pt drank rubbing alcohol in an attempt to OD. Access to Means: Yes Specify Access to Suicidal Means: Pt has access to rubbing alcohol What has been your use of drugs/alcohol within the last 12 months?: Pt has access to drugs and alcohol Previous Attempts/Gestures: No How many times?:  1 Other Self Harm Risks: none identified Triggers for Past Attempts: None known, Other (Comment) Intentional Self Injurious Behavior: None Family Suicide History: No Recent stressful life event(s): Loss (Comment), Conflict  (Comment) Persecutory voices/beliefs?: No Depression: Yes Depression Symptoms: Despondent, Loss of interest in usual pleasures, Feeling worthless/self pity Substance abuse history and/or treatment for substance abuse?: Yes Suicide prevention information given to non-admitted patients: Not applicable  Risk to Others within the past 6 months Homicidal Ideation: No Does patient have any lifetime risk of violence toward others beyond the six months prior to admission? : No Thoughts of Harm to Others: No Current Homicidal Intent: No Current Homicidal Plan: No Access to Homicidal Means: No Identified Victim: none identified History of harm to others?: No Assessment of Violence: None Noted Violent Behavior Description: none identified Does patient have access to weapons?: No Criminal Charges Pending?: No Does patient have a court date: No Is patient on probation?: No  Psychosis Hallucinations: Auditory Delusions: None noted  Mental Status Report Appearance/Hygiene: In scrubs Eye Contact: Good Motor Activity: Freedom of movement Speech: Logical/coherent Level of Consciousness: Alert Mood: Depressed, Anxious Affect: Appropriate to circumstance Anxiety Level: Minimal Thought Processes: Coherent, Relevant Judgement: Partial Orientation: Person, Time, Place, Situation Obsessive Compulsive Thoughts/Behaviors: None  Cognitive Functioning Concentration: Normal Memory: Recent Intact, Remote Intact IQ: Average Insight: Poor Impulse Control: Poor Appetite: Good Weight Loss: 0 Weight Gain: 0 Sleep: No Change Vegetative Symptoms: None  ADLScreening [facility]) Patient's cognitive ability adequate to safely complete daily activities?: Yes Patient able to express need for assistance with ADLs?: Yes Independently performs ADLs?: Yes (appropriate for developmental age)  Prior Inpatient Therapy Prior Inpatient Therapy: Yes Prior Therapy Dates: 03/2016 Prior Therapy  Facilty/Provider(s): [facility] Reason for Treatment: alcohol  Prior Outpatient Therapy Prior Outpatient Therapy: No Prior Therapy Dates: None reported Prior Therapy Facilty/Provider(s): None reported Reason for Treatment: None reported Does patient have an ACCT team?: No Does patient have Intensive In-House Services?  : No Does patient have [facility] services? : No Does patient have P4CC services?: No  ADL Screening (condition at time of admission) Patient's cognitive ability adequate to safely complete daily activities?: Yes Patient able to express need for assistance with ADLs?: Yes Independently performs ADLs?: Yes (appropriate for developmental age)       Abuse/Neglect Assessment (Assessment to be complete while patient is alone) Physical Abuse: Denies Verbal Abuse: Denies Sexual Abuse: Denies Exploitation of patient/patient's resources: Denies Self-Neglect: Denies Values / Beliefs Cultural Requests During Hospitalization: None Spiritual Requests During Hospitalization: None        Additional Information 1:1 In Past 12 Months?: No CIRT Risk: No Elopement Risk: No Does patient have medical clearance?: Yes     Disposition:  Disposition Initial Assessment Completed for this Encounter: Yes Disposition of Patient: Inpatient treatment program Type of inpatient treatment program: Adult Other disposition(s): Other (Comment) (Pt admitted to [facility].)  On Site Evaluation by:   Reviewed with Physician:    [place] 07/09/2016 11:38 PM

## 2016-07-09 NOTE — BHH Counselor (Signed)
Per Dr. Toni Amend, pt meets criteria for inpatient admission.  Pt hs been assigned to bed 304-A.  Attending will be Dr. Ardyth Harps.

## 2016-07-09 NOTE — ED Provider Notes (Addendum)
[facility] Emergency Department Provider Note        Time seen: ----------------------------------------- 12:19 PM on 07/09/2016 -----------------------------------------    I have reviewed the triage vital signs and the nursing notes.   HISTORY  Chief Complaint Suicidal    HPI Curtis Cobb is a 45 y.o. male who presents to ER for suicidal thoughts for the last week. Patient states she does not have a plan but does want to talk about it. Patient states his been staying the [facility] shelter but left and is currently homeless. He reports he can keep it together anymore, he is brought in by police. Patient states he drinks alcohol daily and has been drinking rubbing alcohol.   Past Medical History  Diagnosis Date  . Bipolar 1 disorder (HCC)   . Schizophrenia (HCC)   . Depression   . Anxiety   . Rheumatoid arthritis (HCC)   . Hypertension   . Schizoaffective disorder, depressive type (HCC) 10/06/2015  . Alcohol use disorder, moderate, dependence (HCC) 02/13/2014    Patient Active Problem List   Diagnosis Date Noted  . Schizoaffective disorder (HCC)   . 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 Drug overdose, multiple drugs 03/25/2016  . Opioid use disorder, mild, in sustained remission 10/08/2015  . 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 Alcohol use disorder, severe, dependence (HCC) 10/07/2015  . Cannabis use disorder, severe, dependence (HCC) 10/07/2015  . 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 HTN (hypertension) 10/07/2015  . Rheumatoid arthritis (HCC) 10/07/2015    History reviewed. No pertinent past surgical history.  Allergies Sulfa antibiotics  Social History Social History  Substance Use Topics  . Smoking status: Former Smoker -- 0.00 packs/day for 0 years  . Smokeless tobacco: None  . Alcohol Use: Yes     Comment: Daily-Drank rubbing alcohol today     Review of Systems Constitutional: Negative for fever. Eyes: Negative for visual changes. ENT: Negative for sore throat. Cardiovascular: Negative for chest pain. Respiratory: Negative for shortness of breath. Gastrointestinal: Negative for abdominal pain, vomiting and diarrhea. Genitourinary: Negative for dysuria. Musculoskeletal: Negative for back pain. Skin: Negative for rash. Neurological: Negative for headaches, focal weakness or numbness. Psychiatric: Positive for suicidal ideation, depression  10-point ROS otherwise negative.  ____________________________________________   PHYSICAL EXAM:  VITAL SIGNS: ED Triage Vitals  Enc Vitals Group     BP 07/09/16 1125 159/104 mmHg     Pulse Rate 07/09/16 1125 108     Resp 07/09/16 1125 20     Temp 07/09/16 1125 98.8 F (37.1 C)     Temp Source 07/09/16 1125 Oral     SpO2 07/09/16 1125 99 %     Weight 07/09/16 1125 205 lb (92.987 kg)     Height 07/09/16 1125 6\' 2"  (1.88 m)     Head Cir --      Peak Flow --      Pain Score --      Pain Loc --      Pain Edu? --      Excl. in GC? --     Constitutional: Alert and oriented. Well appearing and in no distress. Eyes: Conjunctivae are normal. PERRL. Normal extraocular movements. ENT   Head: Normocephalic and atraumatic.   Nose: No congestion/rhinnorhea.   Mouth/Throat: Mucous membranes are moist.   Neck: No stridor. Cardiovascular: Normal rate, regular rhythm. No murmurs, rubs, or gallops. Respiratory: Normal respiratory effort without tachypnea nor retractions.  Breath sounds are clear and equal bilaterally. No wheezes/rales/rhonchi. Gastrointestinal: Soft and nontender. Normal bowel sounds Musculoskeletal: Nontender with normal range of motion in all extremities. No lower extremity tenderness nor edema. Neurologic:  Normal speech and language. No gross focal neurologic deficits are appreciated.  Skin:  Skin is warm, dry and intact. No rash noted. Psychiatric:  Depressed mood and affect, does exhibit suicidal thought ____________________________________________  ED COURSE:  Pertinent labs & imaging results that were available during my care of the patient were reviewed by me and considered in my medical decision making (see chart for details). Patient does not appear to be in any acute distress. I will check basic labs and consult psychiatry. ____________________________________________    LABS (pertinent positives/negatives)  Labs Reviewed  COMPREHENSIVE METABOLIC PANEL - Abnormal; Notable for the following:    Potassium 3.3 (*)    CO2 20 (*)    Glucose, Bld 114 (*)    All other components within normal limits  ACETAMINOPHEN LEVEL - Abnormal; Notable for the following:    Acetaminophen (Tylenol), Serum <10 (*)    All other components within normal limits  CBC - Abnormal; Notable for the following:    RDW 14.8 (*)    All other components within normal limits  URINE DRUG SCREEN, QUALITATIVE ([facility] ONLY) - Abnormal; Notable for the following:    Tricyclic, Ur Screen POSITIVE (*)    All other components within normal limits  ETHANOL  SALICYLATE LEVEL   ____________________________________________  FINAL ASSESSMENT AND PLAN  Depression, suicidal ideation, schizophrenia  Plan: Patient with labs as dictated above. Patient presents to the ER no acute distress. He is stable for psychiatric evaluation.   Emily Filbert, MD   Note: This dictation was prepared with Dragon dictation. Any transcriptional errors that result from this process are unintentional   Emily Filbert, MD 07/09/16 1220  Emily Filbert, MD 07/09/16 [id number]

## 2016-07-09 NOTE — ED Notes (Signed)
Report was received from Amy H., RN; Pt. Verbalizes no complaints or distress; verbalizes having S.I.; with a plan to hang himself; with stating, "I'm done; I have nothing to live for."; denies having Hi. Continue to monitor with 15 min. Monitoring.

## 2016-07-09 NOTE — ED Notes (Signed)
Patient resting quietly in room. No noted distress or abnormal behaviors noted. Will continue 15 minute checks and observation by security camera for safety. 

## 2016-07-09 NOTE — ED Notes (Signed)
Patient states that; he was involved in a relationship for (5) years; and that recently ended; and also, his mother died (5) years ago; and that, he never grieved; "all of this just came down on me; and I got so depressed; and I was thinking, I have nothing; I'm done; and the suicidal thoughts came."

## 2016-07-09 NOTE — ED Notes (Signed)

## 2016-07-10 ENCOUNTER — Inpatient Hospital Stay
Admission: RE | Admit: 2016-07-10 | Discharge: 2016-07-13 | DRG: 885 | Disposition: A | Discharge status: Home / Self Care | Payer: No Typology Code available for payment source | Source: Intra-hospital | Attending: Psychiatry | Admitting: Psychiatry

## 2016-07-10 DIAGNOSIS — Z818 Family history of other mental and behavioral disorders: Secondary | ICD-10-CM

## 2016-07-10 DIAGNOSIS — F1520 Other stimulant dependence, uncomplicated: Secondary | ICD-10-CM | POA: Diagnosis present

## 2016-07-10 DIAGNOSIS — Z590 Homelessness: Secondary | ICD-10-CM

## 2016-07-10 DIAGNOSIS — Z882 Allergy status to sulfonamides status: Secondary | ICD-10-CM

## 2016-07-10 DIAGNOSIS — F332 Major depressive disorder, recurrent severe without psychotic features: Secondary | ICD-10-CM | POA: Diagnosis present

## 2016-07-10 DIAGNOSIS — Z560 Unemployment, unspecified: Secondary | ICD-10-CM | POA: Diagnosis not present

## 2016-07-10 DIAGNOSIS — M069 Rheumatoid arthritis, unspecified: Secondary | ICD-10-CM | POA: Diagnosis present

## 2016-07-10 DIAGNOSIS — F1020 Alcohol dependence, uncomplicated: Secondary | ICD-10-CM | POA: Diagnosis present

## 2016-07-10 DIAGNOSIS — F6081 Narcissistic personality disorder: Secondary | ICD-10-CM | POA: Diagnosis present

## 2016-07-10 DIAGNOSIS — G4700 Insomnia, unspecified: Secondary | ICD-10-CM | POA: Diagnosis present

## 2016-07-10 DIAGNOSIS — I10 Essential (primary) hypertension: Secondary | ICD-10-CM | POA: Diagnosis present

## 2016-07-10 DIAGNOSIS — F1320 Sedative, hypnotic or anxiolytic dependence, uncomplicated: Secondary | ICD-10-CM | POA: Diagnosis present

## 2016-07-10 DIAGNOSIS — Z87891 Personal history of nicotine dependence: Secondary | ICD-10-CM

## 2016-07-10 DIAGNOSIS — F333 Major depressive disorder, recurrent, severe with psychotic symptoms: Secondary | ICD-10-CM | POA: Diagnosis not present

## 2016-07-10 DIAGNOSIS — F1111 Opioid abuse, in remission: Secondary | ICD-10-CM | POA: Diagnosis present

## 2016-07-10 DIAGNOSIS — R45851 Suicidal ideations: Secondary | ICD-10-CM | POA: Diagnosis present

## 2016-07-10 DIAGNOSIS — F609 Personality disorder, unspecified: Secondary | ICD-10-CM

## 2016-07-10 DIAGNOSIS — F1220 Cannabis dependence, uncomplicated: Secondary | ICD-10-CM | POA: Diagnosis present

## 2016-07-10 LAB — LIPID PANEL
Cholesterol: 168 mg/dL (ref 0–200)
HDL: 39 mg/dL — AB (ref >40)
LDL CALC: 98 mg/dL (ref 0–99)
TRIGLYCERIDES: 157 mg/dL — AB (ref <150)
Total CHOL/HDL Ratio: 4.3 RATIO
VLDL: 31 mg/dL (ref 0–40)

## 2016-07-10 LAB — HEMOGLOBIN A1C: Hgb A1c MFr Bld: 5 % (ref 4.0–6.0)

## 2016-07-10 LAB — TSH: TSH: 4.531 u[IU]/mL — AB (ref 0.350–4.500)

## 2016-07-10 MED ORDER — ACETAMINOPHEN 325 MG PO TABS
650.0000 mg | ORAL_TABLET | Freq: Four times a day (QID) | ORAL | Status: DC | PRN
Start: 2016-07-10 — End: 2016-07-13
  Administered 2016-07-10 – 2016-07-12 (×2): 650 mg via ORAL
  Filled 2016-07-10 (×2): qty 2

## 2016-07-10 MED ORDER — ALUM & MAG HYDROXIDE-SIMETH 200-200-20 MG/5ML PO SUSP: 30.0000 mL | ORAL | Status: DC | PRN

## 2016-07-10 MED ORDER — TUBERCULIN PPD 5 UNIT/0.1ML ID SOLN
5.0000 [IU] | Freq: Once | INTRADERMAL | Status: AC
  Administered 2016-07-10: 5 [IU] via INTRADERMAL
  Filled 2016-07-10: qty 0.1

## 2016-07-10 MED ORDER — QUETIAPINE FUMARATE 200 MG PO TABS
300.0000 mg | ORAL_TABLET | Freq: Every day | ORAL | Status: DC
  Administered 2016-07-10 – 2016-07-12 (×3): 300 mg via ORAL
  Filled 2016-07-10 (×3): qty 1

## 2016-07-10 MED ORDER — MAGNESIUM HYDROXIDE 400 MG/5ML PO SUSP: 30.0000 mL | Freq: Every day | ORAL | Status: DC | PRN

## 2016-07-10 NOTE — Progress Notes (Signed)
Patient isolative and did not interact with peers. He stated that he was having thoughts of wanting to harm himself. He however contracted for safety. He stated that after he spoke to [employer], his hopes were high. He attended groups and remained med compliant.

## 2016-07-10 NOTE — [facility] Group Notes (Signed)
[facility] Group Notes:  (Nursing/MHT/Case Management/Adjunct)  Date:  07/10/2016  Time:  3:34 PM  Type of Therapy:  Movement Therapy  Participation Level:  Minimal  Participation Quality:  Attentive  Affect:  Excited  Cognitive:  Appropriate  Insight:  Limited  Engagement in Group:  Limited  Modes of Intervention:  Socialization  Summary of Progress/Problems:  Brayli Klingbeil De'Chelle Lenni Reckner 07/10/2016, 3:34 PM

## 2016-07-10 NOTE — Tx Team (Signed)
Initial Interdisciplinary Treatment Plan   PATIENT STRESSORS: Financial difficulties Loss of mother five years ago Marital or family conflict   PATIENT STRENGTHS: Wellsite geologist fund of knowledge   PROBLEM LIST: Problem List/Patient Goals Date to be addressed Date deferred Reason deferred Estimated date of resolution  Suicidal Ideations  07/10/16     "I've been wanting to kill myself". 07/10/16     Homicidal Ideations towards girlfriend  07/10/16     Depression  07/10/16                                    DISCHARGE CRITERIA:  Improved stabilization in mood, thinking, and/or behavior  PRELIMINARY DISCHARGE PLAN: Outpatient therapy  PATIENT/FAMIILY INVOLVEMENT: This treatment plan has been presented to and reviewed with the patient, Steward Drone, and/or family member.  The patient and family have been given the opportunity to ask questions and make suggestions.  Lendell Caprice 07/10/2016, 2:41 AM

## 2016-07-10 NOTE — [facility] Counselor (Signed)
Adult Comprehensive Assessment  Patient ID: Curtis Cobb, male DOB: 12-07-1971, 45 y.o. MRN: [id number]  Information Source: Information source: Patient  Current Stressors:  Employment / Job issues: lost Educational psychologist / Lack of resources (include bankruptcy): unemployed Housing / Lack of housing: homeless Substance abuse: alcohol abuse and substance abuse  Living/Environment/Situation:  Living Arrangements: Homeless without any prospects Living conditions (as described by patient or guardian): lost housing when I left the homeless shelter on my own free will despite not having a potential residence What is atmosphere in current home: Comfortable  Family History:  Marital status: Single  Childhood History:     Education:  Highest grade of school patient has completed: NA Currently a student?: No Name of school: NA Learning disability?: No  Employment/Work Situation:  Employment situation: Unemployed Patient's job has been impacted by current illness: Yes Describe how patient's job has been impacted: patient quit taking prescribed antidepressant after taking more than prescribed and running out  Has patient ever served in combat?: No Are There Guns or Other Weapons in Your Home?: No  Financial Resources:  Financial resources: Income from employment Does patient have a representative payee or guardian?: No  Alcohol/Substance Abuse:  What has been your use of drugs/alcohol within the last 12 months?: alcohol abuse, opiates, and will use other substance if they are present, pt is a vague historian Alcohol/Substance Abuse Treatment Hx: Past Tx, Inpatient, Past Tx, Outpatient If yes, describe treatment: [facility]  Social Support System:  Patient's Community Support System: Fair Museum/gallery exhibitions officer System: [facility], friends and family  Leisure/Recreation:  Museum/gallery exhibitions officer, camping and fly-fishing    Strengths/Needs: Outdoors activities    Discharge Plan:   Does patient have access to transportation?: Yes Will patient be returning to same living situation after discharge?: Yes,. Pt hopes to discharge to [facility] inpatient tx Currently receiving community mental health services: No Does patient have financial barriers related to discharge medications?: Yes Patient description of barriers related to discharge medications: Pt is already a [facility] client  Summary/Recommendations: Patient presented to the hospital under IVC and was admitted for worsening symptoms of depression and suicidal ideations with a plan.  Pt's primary diagnosis is MDD (major depressive disorder), recurrent episode, severe (HCC).  Pt reports primary triggers for admission were a friend's concerns for the patient.  Pt reports his stressors are homelessness and unemployment.  Pt now denies SI/HI/AVH.  Patient is homeless in [place], [place].  Pt lists supports in the community as nonexistent.  Patient will benefit from crisis stabilization, medication evaluation, group therapy, and psycho education in addition to case management for discharge planning. [name] reviewed pt.'s identified goals and treatment plan. Pt verbalized understanding and agreed to treatment plan.  At discharge it is recommended that patient remain compliant with established plan and continue treatment.  Dorothe Pea. Emojean Gertz, LCSWA, LCAS  07/10/16  .

## 2016-07-10 NOTE — [facility] Suicide Risk Assessment (Signed)
[facility] INPATIENT:  Family/Significant Other Suicide Prevention Education  Suicide Prevention Education:  Patient Refusal for Family/Significant Other Suicide Prevention Education: The patient Curtis Cobb has refused to provide written consent for family/significant other to be provided Family/Significant Other Suicide Prevention Education during admission and/or prior to discharge.  Physician notified.  CSW completed SPE with the CSW  Mercy Riding 07/10/2016, 4:30 PM

## 2016-07-10 NOTE — [facility] Counselor (Deleted)
Adult Comprehensive Assessment  Patient ID: Curtis Cobb, male   DOB: [birth year], 45 y.o.   MRN: [id number]  Information Source: Information source: Patient  Current Stressors:     Living/Environment/Situation:     Family History:     Childhood History:     Education:     Employment/Work Situation:      Architect:      Alcohol/Substance Abuse:      Social Support System:      Leisure/Recreation:      Strengths/Needs:      Discharge Plan:      Summary/Recommendations:   Summary and Recommendations (to be completed by the evaluator): Patient presented to the hospital under IVC and was admitted for worsening symptoms of depression and suicidal ideations with a plan.  Pt's primary diagnosis is MDD (major depressive disorder), recurrent episode, severe (HCC).  Pt reports primary triggers for admission were a friend's concerns for the patient.  Pt reports his stressors are homelessness and unemployment.  Pt now denies SI/HI/AVH.  Patient is homeless in [place], [place].  Pt lists supports in the community as nonexistent.  Patient will benefit from crisis stabilization, medication evaluation, group therapy, and psycho education in addition to case management for discharge planning. [name] reviewed pt.'s identified goals and treatment plan. Pt verbalized understanding and agreed to treatment plan.  At discharge it is recommended that patient remain compliant with established plan and continue treatment.  Curtis Cobb. 07/10/2016

## 2016-07-10 NOTE — Progress Notes (Signed)
Recreation Therapy Notes  Date: 07.14.17 Time: 1:00 pm Location: Craft Room  Group Topic: Communication, Problem solving, Teamwork  Goal Area(s) Addresses:  Patient will effectively work with peer towards shared goal. Patient will identify skills used to make activity successful. Patient will identify benefit of using group skills effectively post d/c.  Behavioral Response: Did not attend  Intervention: [employer]  Activity: Patients were given 15 pipe cleaners and instructed to build a free standing tower. Patients were given 2 minutes to strategize. After about 5 minutes of building, patients were instructed to put their dominant hand behind their back. After another 5 minutes of building, patients were instructed to stop talking to each other.  Education: LRT educated patients on healthy support systems.  Education Outcome: Patient did not attend group.  Clinical Observations/Feedback: Patient did not attend group.  Jacquelynn Cree, LRT/CTRS 07/10/2016 2:50 PM

## 2016-07-10 NOTE — Progress Notes (Signed)
Patient ID: Curtis Cobb, male   DOB: 1971-01-07, 45 y.o.   MRN: [id number] Patient admitted IVC after SI with a plan to hang himself in the woods. Patient also endorsing HI towards girlfriend of five years who recently broke up with him. He stated he had a plan but was reluctant to state what it was. He also endorses AH. Patient states a stressors is that his mother died five years ago and he never dealt with it. Vital Signs stable. Patient oriented to unit. Patient calm and cooperative during admission. Skin check done by [facility]. No contraband found. Self inflicted scars on Left forearm. Safety maintained with 15 min checks.

## 2016-07-10 NOTE — Plan of Care (Signed)
Problem: Health Behavior/Discharge Planning: Goal: Compliance with treatment plan for underlying cause of condition will improve Outcome: Progressing Attended groups, however noted to be somewhat interruptive in the 2pm group.

## 2016-07-10 NOTE — Progress Notes (Signed)
   07/10/16 1350  Clinical Encounter Type  Visited With Patient  Visit Type Behavioral Health  Referral From Nurse  Consult/Referral To Chaplain  Spiritual Encounters  Spiritual Needs Emotional  Stress Factors  Patient Stress Factors Major life changes;Other (Comment) (hyper-emotionality)  Attempted to assess pt. needs. He was so hyper that we could not focus on any topic. His conversation strayed from physical ailments to memories of scouting to hyper-religiousity to medical care from years past. Advised pt. to follow instructions of healthcare providers. Will follow up to see if he becomes less manic so that we can have a conversation. Chap. Erman Thum G. Ajwa Kimberley, ext. [phone]

## 2016-07-10 NOTE — H&P (Signed)
Psychiatric Admission Assessment Adult  Patient Identification: Curtis Cobb MRN:  [id number] Date of Evaluation:  07/10/2016 Chief Complaint:  Major Depressive Disorder Principal Diagnosis: MDD (major depressive disorder), recurrent episode, severe (HCC) Diagnosis:   Patient Active Problem List   Diagnosis Date Noted  . Severe recurrent major depression with psychotic features (HCC) [F33.3] 07/10/2016  . Suicidal ideation [R45.851] 07/09/2016  . Sedative, hypnotic or anxiolytic use disorder, severe, dependence (HCC) [F13.20] 03/30/2016  . MDD (major depressive disorder), recurrent episode, severe (HCC) [F33.2] 11/17/2015  . Opioid use disorder, mild, in sustained remission [F11.90] 10/08/2015  . Alcohol use disorder, severe, dependence (HCC) [F10.20] 10/07/2015  . Cannabis use disorder, severe, dependence (HCC) [F12.20] 10/07/2015  . 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 HTN (hypertension) [I10] 10/07/2015  . Rheumatoid arthritis (HCC) [M06.9] 10/07/2015   History of Present Illness:   Curtis Cobb is a 45 y.o. male patient admitted with "I've been wanting to kill myself".  This patient has history of alcoholism and multiple use of other substances. He has been hospitalized in our facility previous times under similar circumstances.  Patient says that he has been homeless for the last 3-4 weeks he has been staying at the homeless shelter  in [place]. Prior to that he was staying with his girlfriend of several years but they recently broke up.   Due to the end of the relationship his mood has worsened to the point that he feels he has no desire of leaving anymore. One of his friends petition him for psychiatric evaluation yesterday. He says that he had a suicidal plan but does not want to tell me about it.    Patient also reported a multitude of other symptoms some that don't really fit into any specific disorder he complains of having at times some hallucinations  but then he will be fine for years and will not happen again. Currently he denies any of these symptoms.  Patient seems to try to minimize the severity of the substance use. He was very vague in answering his questions about substance abuse but elaborated with great details around when talking about mental illness. He denies drinking rubbing alcohol prior to admission however per day psychiatrist in the emergency department the patient told that he had drank rubbing alcohol yesterday as a suicidal attempt.  Today he has been working with the Child psychotherapist and learned that he could go to a residential facility for substance abuse for 6-9 months. Patient says that after hearing this he has been feeling much more hopeful and is looking for work to be transferred from here to [facility].  Patient tells me he has not been following up with any psychiatrist or substance abuse program.  Substance abuse history: Long history of severe alcohol abuse with abuse of at times extremely large amounts of alcohol. Judging from the chart it looks like he has started to moderate that'll little bit at least compared to how bad it was a year or more ago. Also has a long history of opiate addiction. He tells me he thinks the only time in his life he ever felt well was when he was abusing opiates. Documented history of abusing multiple other drugs as well. Patient tends to minimize the degree to which substances contributed to his problems.    Past Psychiatric History: Multiple hospitalizations. Multiple emergency room visits. Very often his symptoms seemed to resolve once he sobers up. At times continues to have depression even when not abusing drugs.  Has been prescribed multiple medicines. Thought that Seroquel and Elavil might of been of some help. Patient reports that he's had multiple suicide attempts in the past and this is supported by documentation. Minimizes violence although now he is saying he has violent thoughts  towards his ex-girlfriend.   Associated Signs/Symptoms: Depression Symptoms:  depressed mood, insomnia, feelings of worthlessness/guilt, suicidal thoughts with specific plan, anxiety, disturbed sleep, (Hypo) Manic Symptoms:  Impulsivity, Anxiety Symptoms:  Excessive Worry, Psychotic Symptoms:  denies PTSD Symptoms: Negative Total Time spent with patient: 1 hour  Past Psychiatric History:   Is the patient at risk to self? Yes.    Has the patient been a risk to self in the past 6 months? No.  Has the patient been a risk to self within the distant past? No.  Is the patient a risk to others? No.  Has the patient been a risk to others in the past 6 months? No.  Has the patient been a risk to others within the distant past? No.    Past Medical History:  Past Medical History  Diagnosis Date  . Bipolar 1 disorder (HCC)   . Schizophrenia (HCC)   . Depression   . Anxiety   . Rheumatoid arthritis (HCC)   . Hypertension   . Schizoaffective disorder, depressive type (HCC) 10/06/2015  . Alcohol use disorder, moderate, dependence (HCC) 02/13/2014   History reviewed. No pertinent past surgical history. Family History:  Family History  Problem Relation Age of Onset  . Family history unknown: Yes   Family Psychiatric  History:Patient reports that his mother and father suffer from mental health and substance abuse issues. Says that he is grandfather had schizophrenia and one of his cousins committed suicide  Tobacco Screening: Patient does not smoke  Social History: Currently homeless, unemployed and limited support in the community History  Alcohol Use  . Yes    Comment: Daily-Drank rubbing alcohol today     History  Drug Use  . Yes  . Special: Amphetamines    Comment: THC-Past     Allergies:   Allergies  Allergen Reactions  . Sulfa Antibiotics Other (See Comments)    Unknown reaction   Lab Results:  Results for orders placed or performed during the hospital encounter  of 07/10/16 (from the past 48 hour(s))  Lipid panel     Status: Abnormal   Collection Time: 07/10/16  6:50 AM  Result Value Ref Range   Cholesterol 168 0 - 200 mg/dL   Triglycerides 009 (H) <150 mg/dL   HDL 39 (L) >38 mg/dL   Total CHOL/HDL Ratio 4.3 RATIO   VLDL 31 0 - 40 mg/dL   LDL Cholesterol 98 0 - 99 mg/dL    Comment:        Total Cholesterol/HDL:CHD Risk Coronary Heart Disease Risk Table                     Men   Women  1/2 Average Risk   3.4   3.3  Average Risk       5.0   4.4  2 X Average Risk   9.6   7.1  3 X Average Risk  23.4   11.0        Use the calculated Patient Ratio above and the CHD Risk Table to determine the patient's CHD Risk.        ATP III CLASSIFICATION (LDL):  <100     mg/dL   Optimal  182-993  mg/dL  Near or Above                    Optimal  130-159  mg/dL   Borderline  427-062  mg/dL   High  >376     mg/dL   Very High   TSH     Status: Abnormal   Collection Time: 07/10/16  6:50 AM  Result Value Ref Range   TSH 4.531 (H) 0.350 - 4.500 uIU/mL    Blood Alcohol level:  Lab Results  Component Value Date   ETH <5 07/09/2016   ETH <5 05/22/2016    Metabolic Disorder Labs:  Lab Results  Component Value Date   HGBA1C 5.2 11/13/2015   No results found for: PROLACTIN Lab Results  Component Value Date   CHOL 168 07/10/2016   TRIG 157* 07/10/2016   HDL 39* 07/10/2016   CHOLHDL 4.3 07/10/2016   VLDL 31 07/10/2016   LDLCALC 98 07/10/2016    Current Medications: Current Facility-Administered Medications  Medication Dose Route Frequency Provider Last Rate Last Dose  . acetaminophen (TYLENOL) tablet 650 mg  650 mg Oral Q6H PRN Audery Amel, MD   650 mg at 07/10/16 0058  . alum & mag hydroxide-simeth (MAALOX/MYLANTA) 200-200-20 MG/5ML suspension 30 mL  30 mL Oral Q4H PRN Audery Amel, MD      . magnesium hydroxide (MILK OF MAGNESIA) suspension 30 mL  30 mL Oral Daily PRN Audery Amel, MD      . QUEtiapine (SEROQUEL) tablet 300 mg  300  mg Oral QHS Audery Amel, MD      . tuberculin injection 5 Units  5 Units Intradermal Once Jimmy Footman, MD       PTA Medications: No prescriptions prior to admission    Musculoskeletal: Strength & Muscle Tone: within normal limits Gait & Station: normal Patient leans: N/A  Psychiatric Specialty Exam: Physical Exam  Constitutional: He is oriented to person, place, and time. He appears well-developed and well-nourished.  HENT:  Head: Normocephalic and atraumatic.  Eyes: Conjunctivae and EOM are normal.  Neck: Normal range of motion.  Respiratory: Effort normal.  Musculoskeletal: Normal range of motion.  Neurological: He is alert and oriented to person, place, and time.    Review of Systems  Constitutional: Negative.   HENT: Negative.   Eyes: Negative.   Respiratory: Negative.   Cardiovascular: Negative.   Gastrointestinal: Negative.   Genitourinary: Negative.   Musculoskeletal: Negative.   Skin: Negative.   Neurological: Negative.   Endo/Heme/Allergies: Negative.   Psychiatric/Behavioral: Positive for depression and suicidal ideas. The patient is nervous/anxious and has insomnia.     Blood pressure 138/83, pulse 99, temperature 98.1 F (36.7 C), temperature source Oral, resp. rate 20, height 6\' 2"  (1.88 m), weight 90.266 kg (199 lb), SpO2 99 %.Body mass index is 25.54 kg/(m^2).  General Appearance: Fairly Groomed  Eye Contact:  Good  Speech:  Clear and Coherent  Volume:  Normal  Mood:  Anxious and Dysphoric  Affect:  Appropriate and Congruent  Thought Process:  Linear and Descriptions of Associations: Intact  Orientation:  Full (Time, Place, and Person)  Thought Content:  Hallucinations: None  Suicidal Thoughts:  Yes.  without intent/plan  Homicidal Thoughts:  No  Memory:  Immediate;   Good Recent;   Good Remote;   Good  Judgement:  Poor  Insight:  Shallow  Psychomotor Activity:  Normal  Concentration:  Concentration: Good and Attention Span: Good   Recall:  Good  Fund  of Knowledge:  Good  Language:  Good  Akathisia:  No  Handed:    AIMS (if indicated):     Assets:  Communication Skills Social Support  ADL's:  Intact  Cognition:  WNL  Sleep:  Number of Hours: 4.25    Treatment Plan Summary:  Depression: Patient has been offered antidepressants in the past where he has always been opposed with taking these agents. He has been prescribed with Seroquel in the past by an outpatient psychiatrist he is not longer involved with.  The doctor that saw him last night and emergency department start him back on Seroquel 300 mg.  I do not believe this patient has bipolar disorder or a primary psychotic disorder. I feel that he has cluster B traits some borderline symptoms and narcissistic.  Hypertension patient says that he is not on any medications and that he actually has lost weight recently. He is not on any medications for blood pressure will observe and see if he needs to be on any antihypertensive  Rheumatoid arthritis: Patient is not currently reporting any symptoms of rheumatoid tried his that he was diagnosed with this is a day in the past and was treated with. Mr. a while back that currently not taking any medications and not having any issues  Polysubstance dependence: Per social worker the patient is interested in residential treatment for substance abuse. Social worker has arranged for him to be transferred to the [facility] house on Monday.  PPD has been ordered  Diet regular neck  Precautions every 15 minute checks  Hospitalization and status continue involuntary commitment   I certify that inpatient services furnished can reasonably be expected to improve the patient's condition.    Jimmy Footman, MD 7/14/20171:39 PM

## 2016-07-11 DIAGNOSIS — F333 Major depressive disorder, recurrent, severe with psychotic symptoms: Secondary | ICD-10-CM

## 2016-07-11 LAB — PROLACTIN: PROLACTIN: 13.1 ng/mL (ref 4.0–15.2)

## 2016-07-11 NOTE — [facility] Group Notes (Signed)
[facility] LCSW Group Therapy  07/11/2016 1:59 PM  Type of Therapy:  Group Therapy  Participation Level:  Active  Participation Quality:  Sharing  Affect:  Appropriate  Cognitive:  Alert  Insight:  Lacking  Engagement in Therapy:  Engaged  Modes of Intervention:  Discussion, Education, Orientation and Support  Summary of Progress/Problems:  Emotional Regulation: Patients will identify both negative and positive emotions. They will discuss emotions they have difficulty regulating and how they impact their lives. Patients will be asked to identify healthy coping skills to combat unhealthy reactions to negative emotions.   Pt provided some insight into the group discussion and shared examples of how he struggled with emotional regulation. Pt was alert and attentive to the CSW who attempted to engage pt on more positive thinking and how to utilize positive coping skills.   Curtis Merolla G. Garnette Czech MSW, LCSWA  07/11/2016, 1:59 PM

## 2016-07-11 NOTE — Plan of Care (Signed)
Problem: Education: Goal: Ability to make informed decisions regarding treatment will improve Outcome: Not Progressing Patient did not complete daily audit sheet this am.

## 2016-07-11 NOTE — Progress Notes (Addendum)
Patient with depressed affect, withdrawn behavior in bed this am. No SI/HI at this time. Does not complete daily audit sheet this am. Minimal interaction with peers. Appropriate with staff. Safety maintained. Attends afternoon therapy group.

## 2016-07-11 NOTE — [facility] Group Notes (Signed)
[facility] LCSW Group Therapy   07/10/2016 9:30 AM   Type of Therapy: Group Therapy   Participation Level: Active   Participation Quality: Attentive, Sharing and Supportive   Affect: Appropriate   Cognitive: Alert and Oriented   Insight: Developing/Improving and Engaged   Engagement in Therapy: Developing/Improving and Engaged   Modes of Intervention: Clarification, Confrontation, Discussion, Education, Exploration, Limit-setting, Orientation, Problem-solving, Rapport Building, Dance movement psychotherapist, Socialization and Support   Summary of Progress/Problems: The topic for today was feelings about relapse. Pt discussed what relapse prevention is to them and identified triggers that they are on the path to relapse. Pt processed their feeling towards relapse and was able to relate to peers. Pt discussed coping skills that can be used for relapse prevention. Pt shared that in order to prevent relapse the pt has to learn to cope with feelings the pt describes as "unlike what my desire to behave is"..  When prompted by the CSW to clarify the pt began to speak quickly and with disconnected thoughts and speech, and presented as manic.  Pt later in the session presented as pleasant and calm, aeb the pt being able to listen more than talk.  Pt was polite and cooperative with the CSW and other group members and focused and attentive to the topics discussed and the sharing of others.  Pt did share that the pt could begin to visit 12-step programs again, in order to cope. Curtis Cobb. Kendelle Schweers, MSW, LCSWA, LCAS

## 2016-07-11 NOTE — Progress Notes (Signed)
Pt endorses SI without plan. Denies AVH. Isolated to room. Minimal interaction. Medication compliant. Voices no additional concerns at this time. Safety maintained.

## 2016-07-11 NOTE — Progress Notes (Signed)
[facility] MD Progress Note  07/11/2016 10:30 PM Curtis Cobb  MRN:  [id number] Subjective:  Follow-up for this 45 year old man with a history of alcohol and drug abuse mood instability behavior problems. Patient had presented himself yesterday as being an profound and extreme distress when I admitted him. On reevaluation today he says that he is feeling much better. In fact he says he is feeling better than he has felt in years. Denies any suicidal thoughts. Feels much more confident and stable. Principal Problem: MDD (major depressive disorder), recurrent episode, severe (HCC) Diagnosis:   Patient Active Problem List   Diagnosis Date Noted  . Severe recurrent major depression with psychotic features (HCC) [F33.3] 07/10/2016  . Suicidal ideation [R45.851] 07/09/2016  . Sedative, hypnotic or anxiolytic use disorder, severe, dependence (HCC) [F13.20] 03/30/2016  . MDD (major depressive disorder), recurrent episode, severe (HCC) [F33.2] 11/17/2015  . Opioid use disorder, mild, in sustained remission [F11.90] 10/08/2015  . Alcohol use disorder, severe, dependence (HCC) [F10.20] 10/07/2015  . Cannabis use disorder, severe, dependence (HCC) [F12.20] 10/07/2015  . 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 HTN (hypertension) [I10] 10/07/2015  . Rheumatoid arthritis (HCC) [M06.9] 10/07/2015   Total Time spent with patient: 20 minutes  Past Psychiatric History: Patient has a history of multiple presentations with identical types of symptoms. Mood problems seem to be very closely tied to his substance abuse and issue which she does not seem to be willing to acknowledge. Unclear how helpful medications have been although he insists that mood stabilizers have been useful for him.  Past Medical History:  Past Medical History  Diagnosis Date  . Depression   . Anxiety   . Rheumatoid arthritis (HCC)   . Hypertension   . Alcohol use disorder, moderate, dependence (HCC) 02/13/2014   History  reviewed. No pertinent past surgical history. Family History:  Family History  Problem Relation Age of Onset  . Family history unknown: Yes   Family Psychiatric  History: Unknown Social History:  History  Alcohol Use  . Yes    Comment: Daily-Drank rubbing alcohol today     History  Drug Use  . Yes  . Special: Amphetamines    Comment: THC-Past    Social History   Social History  . Marital Status: Single    Spouse Name: N/A  . Number of Children: N/A  . Years of Education: N/A   Social History Main Topics  . Smoking status: Former Smoker -- 0.00 packs/day for 0 years  . Smokeless tobacco: None  . Alcohol Use: Yes     Comment: Daily-Drank rubbing alcohol today  . Drug Use: Yes    Special: Amphetamines     Comment: THC-Past  . Sexual Activity: Yes    Birth Control/ Protection: None   Other Topics Concern  . None   Social History Narrative   Additional Social History:                         Sleep: Fair  Appetite:  Fair  Current Medications: Current Facility-Administered Medications  Medication Dose Route Frequency Provider Last Rate Last Dose  . acetaminophen (TYLENOL) tablet 650 mg  650 mg Oral Q6H PRN Audery Amel, MD   650 mg at 07/10/16 0058  . alum & mag hydroxide-simeth (MAALOX/MYLANTA) 200-200-20 MG/5ML suspension 30 mL  30 mL Oral Q4H PRN Audery Amel, MD      . magnesium hydroxide (MILK OF MAGNESIA) suspension 30 mL  30 mL Oral Daily PRN Audery Amel, MD      . QUEtiapine (SEROQUEL) tablet 300 mg  300 mg Oral QHS Audery Amel, MD   300 mg at 07/11/16 2146  . tuberculin injection 5 Units  5 Units Intradermal Once Jimmy Footman, MD   5 Units at 07/10/16 1433    Lab Results:  Results for orders placed or performed during the hospital encounter of 07/10/16 (from the past 48 hour(s))  Hemoglobin A1c     Status: None   Collection Time: 07/10/16  6:50 AM  Result Value Ref Range   Hgb A1c MFr Bld 5.0 4.0 - 6.0 %  Lipid panel      Status: Abnormal   Collection Time: 07/10/16  6:50 AM  Result Value Ref Range   Cholesterol 168 0 - 200 mg/dL   Triglycerides 867 (H) <150 mg/dL   HDL 39 (L) >61 mg/dL   Total CHOL/HDL Ratio 4.3 RATIO   VLDL 31 0 - 40 mg/dL   LDL Cholesterol 98 0 - 99 mg/dL    Comment:        Total Cholesterol/HDL:CHD Risk Coronary Heart Disease Risk Table                     Men   Women  1/2 Average Risk   3.4   3.3  Average Risk       5.0   4.4  2 X Average Risk   9.6   7.1  3 X Average Risk  23.4   11.0        Use the calculated Patient Ratio above and the CHD Risk Table to determine the patient's CHD Risk.        ATP III CLASSIFICATION (LDL):  <100     mg/dL   Optimal  950-932  mg/dL   Near or Above                    Optimal  130-159  mg/dL   Borderline  671-245  mg/dL   High  >809     mg/dL   Very High   Prolactin     Status: None   Collection Time: 07/10/16  6:50 AM  Result Value Ref Range   Prolactin 13.1 4.0 - 15.2 ng/mL    Comment: (NOTE) Performed At: [facility] [address] [place], [place] [id number] Mila Homer MD LZ:[id number]   TSH     Status: Abnormal   Collection Time: 07/10/16  6:50 AM  Result Value Ref Range   TSH 4.531 (H) 0.350 - 4.500 uIU/mL    Blood Alcohol level:  Lab Results  Component Value Date   ETH <5 07/09/2016   ETH <5 05/22/2016    Metabolic Disorder Labs: Lab Results  Component Value Date   HGBA1C 5.0 07/10/2016   Lab Results  Component Value Date   PROLACTIN 13.1 07/10/2016   Lab Results  Component Value Date   CHOL 168 07/10/2016   TRIG 157* 07/10/2016   HDL 39* 07/10/2016   CHOLHDL 4.3 07/10/2016   VLDL 31 07/10/2016   LDLCALC 98 07/10/2016    Physical Findings: AIMS:  , ,  ,  , Dental Status Current problems with teeth and/or dentures?: No Does patient usually wear dentures?: No  CIWA:    COWS:     Musculoskeletal: Strength & Muscle Tone: within normal limits Gait & Station: normal Patient  leans: N/A  Psychiatric Specialty Exam: Physical  Exam  Nursing note and vitals reviewed. Constitutional: He appears well-developed and well-nourished.  HENT:  Head: Normocephalic and atraumatic.  Eyes: Conjunctivae are normal. Pupils are equal, round, and reactive to light.  Neck: Normal range of motion.  Cardiovascular: Normal heart sounds.   Respiratory: Effort normal.  GI: Soft.  Musculoskeletal: Normal range of motion.  Neurological: He is alert.  Skin: Skin is warm and dry.  Psychiatric: He has a normal mood and affect. His behavior is normal. Judgment and thought content normal.    Review of Systems  Constitutional: Negative.   HENT: Negative.   Eyes: Negative.   Respiratory: Negative.   Cardiovascular: Negative.   Gastrointestinal: Negative.   Musculoskeletal: Negative.   Skin: Negative.   Neurological: Negative.   Psychiatric/Behavioral: Positive for memory loss and substance abuse. Negative for depression, suicidal ideas and hallucinations. The patient is not nervous/anxious and does not have insomnia.     Blood pressure 144/86, pulse 97, temperature 98.1 F (36.7 C), temperature source Oral, resp. rate 20, height 6\' 2"  (1.88 m), weight 90.266 kg (199 lb), SpO2 99 %.Body mass index is 25.54 kg/(m^2).  General Appearance: Disheveled  Eye Contact:  Good  Speech:  Clear and Coherent  Volume:  Normal  Mood:  Euthymic  Affect:  Congruent  Thought Process:  Goal Directed  Orientation:  Full (Time, Place, and Person)  Thought Content:  Logical  Suicidal Thoughts:  No  Homicidal Thoughts:  No  Memory:  Immediate;   Good Recent;   Fair Remote;   Fair  Judgement:  Fair  Insight:  Fair  Psychomotor Activity:  Decreased  Concentration:  Concentration: Fair  Recall:  [employer] of Knowledge:  Fair  Language:  Fair  Akathisia:  No  Handed:  Right  AIMS (if indicated):     Assets:  Desire for Improvement Physical Health  ADL's:  Intact  Cognition:  WNL  Sleep:   Number of Hours: 8     Treatment Plan Summary: Daily contact with patient to assess and evaluate symptoms and progress in treatment, Medication management and Plan Patient is already stabilizing. Mood is improved. No suicidal ideation. Detoxing without difficulty. No need for any other change to medicine at this point. Supportive counseling and encouraged him to stay focused on outpatient stability. Follow-up tomorrow  Mordecai Rasmussen, MD 07/11/2016, 10:30 PM

## 2016-07-12 NOTE — [facility] Group Notes (Signed)
[facility] LCSW Group Therapy  07/12/2016 1:37 PM  Type of Therapy: Group Therapy  Participation Level: Active  Participation Quality: Attentive  Affect: Appropriate  Cognitive: Alert  Insight: Improving  Engagement in Therapy: Improving  Modes of Intervention: Activity, Discussion, Education, Socialization and Support  Summary of Progress/Problems: Mindfulness: Patient discussed mindfulness and relaxing techniques and why they are beneficial. Pt discussed ways to incorporate mindfulness in their lives. Pt practiced a mindfulness techique and discussed how it made them feel. Pt attended group and stayed the entire time. He states he is feeling much better than yesterday. He found the mindfulness activity helpful.   Daisy Floro Kami Kube MSW, LCSWA  07/12/2016, 1:37 PM

## 2016-07-12 NOTE — Progress Notes (Signed)
[facility] MD Progress Note  07/12/2016 6:04 PM Curtis Cobb  MRN:  [id number] Subjective: Follow-up for 45 year old man with substance abuse problems and mood instability. He continues to feel more upbeat and optimistic. He is hoping that he will be able to go to [facility] for outpatient treatment. He is willing to accept other alternatives as well. Denies suicidal ideation. Not engaging in any dangerous behavior. Does not seem to be having symptomatic withdrawal. Vital signs normal. Principal Problem: MDD (major depressive disorder), recurrent episode, severe (HCC) Diagnosis:   Patient Active Problem List   Diagnosis Date Noted  . Severe recurrent major depression with psychotic features (HCC) [F33.3] 07/10/2016  . Suicidal ideation [R45.851] 07/09/2016  . Sedative, hypnotic or anxiolytic use disorder, severe, dependence (HCC) [F13.20] 03/30/2016  . MDD (major depressive disorder), recurrent episode, severe (HCC) [F33.2] 11/17/2015  . Opioid use disorder, mild, in sustained remission [F11.90] 10/08/2015  . Alcohol use disorder, severe, dependence (HCC) [F10.20] 10/07/2015  . Cannabis use disorder, severe, dependence (HCC) [F12.20] 10/07/2015  . 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 HTN (hypertension) [I10] 10/07/2015  . Rheumatoid arthritis (HCC) [M06.9] 10/07/2015   Total Time spent with patient: 20 minutes  Past Psychiatric History: Patient has a history of multiple presentations with identical types of symptoms. Mood problems seem to be very closely tied to his substance abuse and issue which she does not seem to be willing to acknowledge. Unclear how helpful medications have been although he insists that mood stabilizers have been useful for him.  Past Medical History:  Past Medical History  Diagnosis Date  . Depression   . Anxiety   . Rheumatoid arthritis (HCC)   . Hypertension   . Alcohol use disorder, moderate, dependence (HCC) 02/13/2014   History reviewed. No  pertinent past surgical history. Family History:  Family History  Problem Relation Age of Onset  . Family history unknown: Yes   Family Psychiatric  History: Unknown Social History:  History  Alcohol Use  . Yes    Comment: Daily-Drank rubbing alcohol today     History  Drug Use  . Yes  . Special: Amphetamines    Comment: THC-Past    Social History   Social History  . Marital Status: Single    Spouse Name: N/A  . Number of Children: N/A  . Years of Education: N/A   Social History Main Topics  . Smoking status: Former Smoker -- 0.00 packs/day for 0 years  . Smokeless tobacco: None  . Alcohol Use: Yes     Comment: Daily-Drank rubbing alcohol today  . Drug Use: Yes    Special: Amphetamines     Comment: THC-Past  . Sexual Activity: Yes    Birth Control/ Protection: None   Other Topics Concern  . None   Social History Narrative   Additional Social History:                         Sleep: Fair  Appetite:  Fair  Current Medications: Current Facility-Administered Medications  Medication Dose Route Frequency Provider Last Rate Last Dose  . acetaminophen (TYLENOL) tablet 650 mg  650 mg Oral Q6H PRN Audery Amel, MD   650 mg at 07/10/16 0058  . alum & mag hydroxide-simeth (MAALOX/MYLANTA) 200-200-20 MG/5ML suspension 30 mL  30 mL Oral Q4H PRN Audery Amel, MD      . magnesium hydroxide (MILK OF MAGNESIA) suspension 30 mL  30 mL Oral Daily PRN Jonny Ruiz  T Thomasine Klutts, MD      . QUEtiapine (SEROQUEL) tablet 300 mg  300 mg Oral QHS Audery Amel, MD   300 mg at 07/11/16 2146    Lab Results:  No results found for this or any previous visit (from the past 48 hour(s)).  Blood Alcohol level:  Lab Results  Component Value Date   ETH <5 07/09/2016   ETH <5 05/22/2016    Metabolic Disorder Labs: Lab Results  Component Value Date   HGBA1C 5.0 07/10/2016   Lab Results  Component Value Date   PROLACTIN 13.1 07/10/2016   Lab Results  Component Value Date    CHOL 168 07/10/2016   TRIG 157* 07/10/2016   HDL 39* 07/10/2016   CHOLHDL 4.3 07/10/2016   VLDL 31 07/10/2016   LDLCALC 98 07/10/2016    Physical Findings: AIMS:  , ,  ,  , Dental Status Current problems with teeth and/or dentures?: No Does patient usually wear dentures?: No  CIWA:    COWS:     Musculoskeletal: Strength & Muscle Tone: within normal limits Gait & Station: normal Patient leans: N/A  Psychiatric Specialty Exam: Physical Exam  Nursing note and vitals reviewed. Constitutional: He appears well-developed and well-nourished.  HENT:  Head: Normocephalic and atraumatic.  Eyes: Conjunctivae are normal. Pupils are equal, round, and reactive to light.  Neck: Normal range of motion.  Cardiovascular: Normal heart sounds.   Respiratory: Effort normal.  GI: Soft.  Musculoskeletal: Normal range of motion.  Neurological: He is alert.  Skin: Skin is warm and dry.  Psychiatric: He has a normal mood and affect. His behavior is normal. Judgment and thought content normal.    Review of Systems  Constitutional: Negative.   HENT: Negative.   Eyes: Negative.   Respiratory: Negative.   Cardiovascular: Negative.   Gastrointestinal: Negative.   Musculoskeletal: Negative.   Skin: Negative.   Neurological: Negative.   Psychiatric/Behavioral: Positive for memory loss and substance abuse. Negative for depression, suicidal ideas and hallucinations. The patient is not nervous/anxious and does not have insomnia.     Blood pressure 121/77, pulse 115, temperature 97.9 F (36.6 C), temperature source Oral, resp. rate 20, height 6\' 2"  (1.88 m), weight 90.266 kg (199 lb), SpO2 99 %.Body mass index is 25.54 kg/(m^2).  General Appearance: Disheveled  Eye Contact:  Good  Speech:  Clear and Coherent  Volume:  Normal  Mood:  Euthymic  Affect:  Congruent  Thought Process:  Goal Directed  Orientation:  Full (Time, Place, and Person)  Thought Content:  Logical  Suicidal Thoughts:  No   Homicidal Thoughts:  No  Memory:  Immediate;   Good Recent;   Fair Remote;   Fair  Judgement:  Fair  Insight:  Fair  Psychomotor Activity:  Decreased  Concentration:  Concentration: Fair  Recall:  of Knowledge:  Fair  Language:  Fair  Akathisia:  No  Handed:  Right  AIMS (if indicated):     Assets:  Desire for Improvement Physical Health  ADL's:  Intact  Cognition:  WNL  Sleep:  Number of Hours: 7.15     Treatment Plan Summary: Daily contact with patient to assess and evaluate symptoms and progress in treatment, Medication management and Plan Review of treatment plan and encouraging conversation about substance abuse. No change to medication. Patient may be ready to be discharged within the next day.  [employer], MD 07/12/2016, 6:04 PM

## 2016-07-12 NOTE — Plan of Care (Signed)
Problem: Activity: Goal: Interest or engagement in activities will improve Outcome: Progressing Patient actively participating in plan of care.

## 2016-07-12 NOTE — Progress Notes (Signed)
PPD negative with no induration or redness.

## 2016-07-12 NOTE — Progress Notes (Signed)
Patient with depressed affect, cooperative behavior with meals, meds and plan of care. No SI/HI at this time. Attends therapy group and social with peers. Verbalizes needs appropriately with staff. Safety maintained.

## 2016-07-12 NOTE — Progress Notes (Signed)
Denies SI/HI/AVH. Noted to be in dayroom with peers more this evening in comparison to previous evening. Medication compliant. Voices no additional concerns at this time. Safety maintained. Will continue to monitor.

## 2016-07-13 DIAGNOSIS — F609 Personality disorder, unspecified: Secondary | ICD-10-CM

## 2016-07-13 MED ORDER — QUETIAPINE FUMARATE 300 MG PO TABS: 300.0000 mg | ORAL_TABLET | Freq: Every day | ORAL | Status: DC

## 2016-07-13 NOTE — Progress Notes (Signed)
  [facility] Adult Case Management Discharge Plan :  Will you be returning to the same living situation after discharge:  No. Pt will discharge to the [facility] in [place] At discharge, do you have transportation home?: Yes,  pt will be provided with a taxi Do you have the ability to pay for your medications: Yes,  pt will be provided with prescriptions at discharge  Release of information consent forms completed and in the chart;  Patient's signature needed at discharge.  Patient to Follow up at: Follow-up Information    Go to [facility] .   Why:  Please arrive to be admitted for long-term residential substance abuse treatment, medication management and therapy and call Marchelle Gearing at ph: [phone] when you are on your way, in order to be admitted    Contact information:   [facility]  [address] [place], [place] [address] Phone: [phone] Fax: [phone]      Next level of care provider has access to [facility] Link:no  Safety Planning and Suicide Prevention discussed: Yes, with pt.  Have you used any form of tobacco in the last 30 days? (Cigarettes, Smokeless Tobacco, Cigars, and/or Pipes): No  Has patient been referred to the Quitline?: N/A patient is not a smoker  Patient has been referred for addiction treatment: Yes  Dorothe Pea Clevie Prout 07/13/2016, 2:20 PM

## 2016-07-13 NOTE — [facility] Suicide Risk Assessment (Signed)
[facility] Discharge Suicide Risk Assessment   Principal Problem: MDD (major depressive disorder), recurrent episode, severe (HCC) Discharge Diagnoses:  Patient Active Problem List   Diagnosis Date Noted  . 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 Sedative, hypnotic or anxiolytic use disorder, severe, dependence (HCC) [F13.20] 03/30/2016  . MDD (major depressive disorder), recurrent episode, severe (HCC) [F33.2] 11/17/2015  . Opioid use disorder, mild, in sustained remission [F11.90] 10/08/2015  . Alcohol use disorder, severe, dependence (HCC) [F10.20] 10/07/2015  . Cannabis use disorder, severe, dependence (HCC) [F12.20] 10/07/2015  . 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 HTN (hypertension) [I10] 10/07/2015  . Rheumatoid arthritis (HCC) [M06.9] 10/07/2015    Psychiatric Specialty Exam: ROS  Blood pressure 115/80, pulse 98, temperature 97.8 F (36.6 C), temperature source Oral, resp. rate 20, height 6\' 2"  (1.88 m), weight 90.266 kg (199 lb), SpO2 99 %.Body mass index is 25.54 kg/(m^2).                                                       Mental Status Per Nursing Assessment::   On Admission:  Suicidal ideation indicated by patient, Suicide plan, Self-harm thoughts, Self-harm behaviors, Intention to act on suicide plan, Belief that plan would result in death, Thoughts of violence towards others, Plan to harm others  Demographic Factors:  Male, Caucasian, Low socioeconomic status, Living alone and Unemployed  Loss Factors: Financial problems/change in socioeconomic status  Historical Factors: Prior suicide attempts and Impulsivity  Risk Reduction Factors:   Positive social support  Continued Clinical Symptoms:  Alcohol/Substance Abuse/Dependencies Previous Psychiatric Diagnoses and Treatments  Cognitive Features That Contribute To Risk:  None    Suicide Risk:  Minimal: No identifiable suicidal ideation.  Patients presenting  with no risk factors but with morbid ruminations; may be classified as minimal risk based on the severity of the depressive symptoms    [id number], MD 07/13/2016, 9:01 AM

## 2016-07-13 NOTE — Progress Notes (Signed)
   07/12/16 1653 07/12/16 1654 07/12/16 1708  PPD Results  Does patient have an induration at the injection site? --  No No  Induration(mm) 0 mm 0 mm 0 mm     07/12/16 1714  PPD Results  Does patient have an induration at the injection site? No  Induration(mm) 0 mm

## 2016-07-13 NOTE — Progress Notes (Signed)
Denies SI/HI/AVH. Attended evening group. Appropriate with staff and peers. Affect pleasant. Visible in dayroom interacting with peers. Medication compliant. Voices no additional concerns at this time.

## 2016-07-13 NOTE — Discharge Summary (Signed)
Physician Discharge Summary Note  Patient:  Curtis Cobb is an 45 y.o., male MRN:  [id number] DOB:  1971-12-17 Patient phone:  [phone] (home)  Patient address:   [place] [address],  Total Time spent with patient: 30 minutes  Date of Admission:  07/10/2016 Date of Discharge: 7/17  Reason for Admission:  SI  Principal Problem: MDD (major depressive disorder), recurrent episode, severe [facility]) Discharge Diagnoses: Patient Active Problem List   Diagnosis Date Noted  . 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 Sedative, hypnotic or anxiolytic use disorder, severe, dependence ([place]) [F13.20] 03/30/2016  . MDD (major depressive disorder), recurrent episode, severe ([place]) [F33.2] 11/17/2015  . Opioid use disorder, mild, in sustained remission [F11.90] 10/08/2015  . Alcohol use disorder, severe, dependence ([place]) [F10.20] 10/07/2015  . Cannabis use disorder, severe, dependence ([place]) [F12.20] 10/07/2015  . 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 HTN (hypertension) [I10] 10/07/2015  . Rheumatoid arthritis ([place]) [M06.9] 10/07/2015    History of Present Illness:   Curtis Cobb is a 45 y.o. male patient admitted with "I've been wanting to kill myself".  This patient has history of alcoholism and multiple use of other substances. He has been hospitalized in our facility previous times under similar circumstances.  Patient says that he has been homeless for the last 3-4 weeks he has been staying at the homeless shelter in [place]. Prior to that he was staying with his girlfriend of several years but they recently broke up.   Due to the end of the relationship his mood has worsened to the point that he feels he has no desire of leaving anymore. One of his friends petition him for psychiatric evaluation yesterday. He says that he had a suicidal plan but does not want to tell me about it.   Patient also reported a multitude of other symptoms some that  don't really fit into any specific disorder he complains of having at times some hallucinations but then he will be fine for years and will not happen again. Currently he denies any of these symptoms.  Patient seems to try to minimize the severity of the substance use. He was very vague in answering his questions about substance abuse but elaborated with great details around when talking about mental illness. He denies drinking rubbing alcohol prior to admission however per day psychiatrist in the emergency department the patient told that he had drank rubbing alcohol yesterday as a suicidal attempt.  Today he has been working with the Education officer, museum and learned that he could go to a residential facility for substance abuse for 6-9 months. Patient says that after hearing this he has been feeling much more hopeful and is looking for work to be transferred from here to [facility].  Patient tells me he has not been following up with any psychiatrist or substance abuse program.  Substance abuse history: Long history of severe alcohol abuse with abuse of at times extremely large amounts of alcohol. Judging from the chart it looks like he has started to moderate that'll little bit at least compared to how bad it was a year or more ago. Also has a long history of opiate addiction. He tells me he thinks the only time in his life he ever felt well was when he was abusing opiates. Documented history of abusing multiple other drugs as well. Patient tends to minimize the degree to which substances contributed to his problems.    Past Psychiatric History: Multiple hospitalizations. Multiple emergency  room visits. Very often his symptoms seemed to resolve once he sobers up. At times continues to have depression even when not abusing drugs. Has been prescribed multiple medicines. Thought that Seroquel and Elavil might of been of some help. Patient reports that he's had multiple suicide attempts in the past and this is  supported by documentation. Minimizes violence although now he is saying he has violent thoughts towards his ex-girlfriend.  Past Medical History:  Past Medical History  Diagnosis Date  . Depression   . Anxiety   . Rheumatoid arthritis ([place])   . Hypertension   . Alcohol use disorder, moderate, dependence ([place]) 02/13/2014   History reviewed. No pertinent past surgical history.  Family Psychiatric History:Patient reports that his mother and father suffer from mental health and substance abuse issues. Says that he is grandfather had schizophrenia and one of his cousins committed suicide  Tobacco Screening: Patient does not smoke  Social History: Currently homeless, unemployed and limited support in the community  History  Alcohol Use  . Yes    Comment: Daily-Drank rubbing alcohol today     History  Drug Use  . Yes  . Special: Amphetamines    Comment: THC-Past    Social History   Social History  . Marital Status: Single    Spouse Name: N/A  . Number of Children: N/A  . Years of Education: N/A   Social History Main Topics  . Smoking status: Former Smoker -- 0.00 packs/day for 0 years  . Smokeless tobacco: None  . Alcohol Use: Yes     Comment: Daily-Drank rubbing alcohol today  . Drug Use: Yes    Special: Amphetamines     Comment: THC-Past  . Sexual Activity: Yes    Birth Control/ Protection: None   Other Topics Concern  . None   Social History Narrative    Hospital Course:  Depression: Patient has been offered antidepressants in the past where he has always been opposed with taking these agents. He has been prescribed with Seroquel in the past by an outpatient psychiatrist he is not longer involved with. The doctor that saw him last night and emergency department start him back on Seroquel 300 mg. I do not believe this patient has bipolar disorder or a primary psychotic disorder. I feel that he has cluster B traits some borderline symptoms and  narcissistic.  Hypertension patient says that he is not on any medications and that he actually has lost weight recently. He is not on any medications for blood pressure And his blood pressure has been stable.  Rheumatoid arthritis: Patient is not currently reporting any symptoms of rheumatoid tried his that he was diagnosed with this is a day in the past and was treated with. Mr. a while back that currently not taking any medications and not having any issues  Polysubstance dependence: Per social worker the patient is interested in residential treatment for substance abuse. Social worker has arranged for him to be transferred to the [facility] on Monday. PPD neg  Patient will be discharged today to [facility] for residential treatment for substance abuse.  During his stay in the hospital patient was calm, pleasant and cooperative. He did not display any unsafe or destructive behaviors. He participated in programming.  The patient reports significant improvement in mood. He denies suicidality, homicidality or having auditory or visual hallucinations. He denies major problems with sleep, appetite, energy or concentration. Feels that Seroquel is very helpful. He denies hopelessness or helplessness. He is  excited with discharge plans and is motivated for recovery.   Physical Findings: AIMS:  , ,  ,  , Dental Status Current problems with teeth and/or dentures?: No Does patient usually wear dentures?: No  CIWA:    COWS:     Musculoskeletal: Strength & Muscle Tone: within normal limits Gait & Station: normal Patient leans: N/A  Psychiatric Specialty Exam: Physical Exam  Constitutional: He is oriented to person, place, and time. He appears well-developed and well-nourished.  HENT:  Head: Normocephalic.  Eyes: Conjunctivae and EOM are normal.  Neck: Normal range of motion.  Respiratory: Effort normal.  Musculoskeletal: Normal range of motion.  Neurological: He is alert and oriented  to person, place, and time.    Review of Systems  Constitutional: Negative.   HENT: Negative.   Eyes: Negative.   Respiratory: Negative.   Cardiovascular: Negative.   Gastrointestinal: Negative.   Genitourinary: Negative.   Musculoskeletal: Negative.   Skin: Negative.   Neurological: Negative.   Endo/Heme/Allergies: Negative.   Psychiatric/Behavioral: Positive for depression and substance abuse.    Blood pressure 115/80, pulse 98, temperature 97.8 F (36.6 C), temperature source Oral, resp. rate 20, height _0  (1.88 m), weight 90.266 kg (199 lb), SpO2 99 %.Body mass index is 25.54 kg/(m^2).  General Appearance: Fairly Groomed  Eye Contact:  Good  Speech:  Clear and Coherent  Volume:  Normal  Mood:  Dysphoric  Affect:  Appropriate  Thought Process:  Linear and Descriptions of Associations: Intact  Orientation:  Full (Time, Place, and Person)  Thought Content:  Hallucinations: None  Suicidal Thoughts:  No  Homicidal Thoughts:  No  Memory:  Immediate;   Good Recent;   Good Remote;   Good  Judgement:  Good  Insight:  Shallow  Psychomotor Activity:  Normal  Concentration:  Concentration: Good and Attention Span: Good  Recall:  Good  Fund of Knowledge:  Good  Language:  Good  Akathisia:  No  Handed:    AIMS (if indicated):     Assets:  Communication Skills Social Support  ADL's:  Intact  Cognition:  WNL  Sleep:  Number of Hours: 6.3     Have you used any form of tobacco in the last 30 days? (Cigarettes, Smokeless Tobacco, Cigars, and/or Pipes): No  Has this patient used any form of tobacco in the last 30 days? (Cigarettes, Smokeless Tobacco, Cigars, and/or Pipes) Yes, No  Blood Alcohol level:  Lab Results  Component Value Date   [facility] <5 07/09/2016   ETH <5 40/98/1191    Metabolic Disorder Labs:  Lab Results  Component Value Date   HGBA1C 5.0 07/10/2016   Lab Results  Component Value Date   PROLACTIN 13.1 07/10/2016   Lab Results  Component Value Date    CHOL 168 07/10/2016   TRIG 157* 07/10/2016   HDL 39* 07/10/2016   CHOLHDL 4.3 07/10/2016   VLDL 31 07/10/2016   LDLCALC 98 07/10/2016   Results for JAYDEN, KRATOCHVIL (MRN [id number]) as of 07/13/2016 08:55  Ref. Range 07/09/2016 11:25 07/09/2016 12:30 07/10/2016 06:50  Sodium Latest Ref Range: 135-145 mmol/L 139    Potassium Latest Ref Range: 3.5-5.1 mmol/L 3.3 (L)    Chloride Latest Ref Range: 101-111 mmol/L 110    CO2 Latest Ref Range: 22-32 mmol/L 20 (L)    BUN Latest Ref Range: 6-20 mg/dL 9    Creatinine Latest Ref Range: 0.61-1.24 mg/dL 1.20    Calcium Latest Ref Range: 8.9-10.3 mg/dL 9.1    EGFR (  Non-African Amer.) Latest Ref Range: >60 mL/min >60    EGFR (African American) Latest Ref Range: >60 mL/min >60    Glucose Latest Ref Range: 65-99 mg/dL 114 (H)    Anion gap Latest Ref Range: 5-15  9    Alkaline Phosphatase Latest Ref Range: 38-126 U/L 75    Albumin Latest Ref Range: 3.5-5.0 g/dL 4.4    AST Latest Ref Range: 15-41 U/L 25    ALT Latest Ref Range: 17-63 U/L 17    Total Protein Latest Ref Range: 6.5-8.1 g/dL 8.0    Total Bilirubin Latest Ref Range: 0.3-1.2 mg/dL 0.9    Cholesterol Latest Ref Range: 0-200 mg/dL   168  Triglycerides Latest Ref Range: <150 mg/dL   157 (H)  HDL Cholesterol Latest Ref Range: >40 mg/dL   39 (L)  LDL (calc) Latest Ref Range: 0-99 mg/dL   98  VLDL Latest Ref Range: 0-40 mg/dL   31  Total CHOL/HDL Ratio Latest Units: RATIO   4.3  WBC Latest Ref Range: 3.8-10.6 K/uL 10.6    RBC Latest Ref Range: 4.40-5.90 MIL/uL 5.18    Hemoglobin Latest Ref Range: 13.0-18.0 g/dL 15.0    HCT Latest Ref Range: 40.0-52.0 % 44.6    MCV Latest Ref Range: 80.0-100.0 fL 86.1    MCH Latest Ref Range: 26.0-34.0 pg 29.0    MCHC Latest Ref Range: 32.0-36.0 g/dL 33.7    RDW Latest Ref Range: 11.5-14.5 % 14.8 (H)    Platelets Latest Ref Range: 150-440 K/uL 350    Acetaminophen (Tylenol), S Latest Ref Range: 10-30 ug/mL <47 (L)    Salicylate Lvl Latest Ref Range: 2.8-30.0  mg/dL <4.0    Prolactin Latest Ref Range: 4.0-15.2 ng/mL   13.1  Hemoglobin A1C Latest Ref Range: 4.0-6.0 %   5.0  TSH Latest Ref Range: 0.350-4.500 uIU/mL   4.531 (H)  Alcohol, Ethyl (B) Latest Ref Range: <5 mg/dL <5    Amphetamines, Ur Screen Latest Ref Range: NONE DETECTED   NONE DETECTED   Barbiturates, Ur Screen Latest Ref Range: NONE DETECTED   NONE DETECTED   Benzodiazepine, Ur Scrn Latest Ref Range: NONE DETECTED   NONE DETECTED   Cocaine Metabolite,Ur [place] Latest Ref Range: NONE DETECTED   NONE DETECTED   Methadone Scn, Ur Latest Ref Range: NONE DETECTED   NONE DETECTED   MDMA (Ecstasy)Ur Screen Latest Ref Range: NONE DETECTED   NONE DETECTED   Cannabinoid 50 Ng, Ur [place] Latest Ref Range: NONE DETECTED   NONE DETECTED   Opiate, Ur Screen Latest Ref Range: NONE DETECTED   NONE DETECTED   Phencyclidine (PCP) Ur S Latest Ref Range: NONE DETECTED   NONE DETECTED   Tricyclic, Ur Screen Latest Ref Range: NONE DETECTED   POSITIVE (A)    See Psychiatric Specialty Exam and Suicide Risk Assessment completed by Attending Physician prior to discharge.  Discharge destination:  Other:  [facility]  Is patient on multiple antipsychotic therapies at discharge:  No   Has Patient had three or more failed trials of antipsychotic monotherapy by history:  No  Recommended Plan for Multiple Antipsychotic Therapies: NA     Medication List    TAKE these medications      Indication   QUEtiapine 300 MG tablet  Commonly known as:  SEROQUEL  Take 1 tablet (300 mg total) by mouth at bedtime.  Notes to Patient:  depression        Follow-up Information    Go to [facility] .  Why:  Please arrive to be admitted for long-term residential substance abuse treatment, medication management and therapy and call Lorenza Evangelist at ph: [phone] when you are on your way, in order to be admitted    Contact information:   [place]  [address] [place], [place]  [address] Phone: [phone] Fax: [phone]       Signed: Hildred Priest, MD 07/13/2016, 2:38 PM

## 2016-07-13 NOTE — Tx Team (Signed)
Interdisciplinary Treatment Plan Update (Adult)         Date: 07/13/2016   Time Reviewed: 9:30 AM   Progress in Treatment: Improving Attending groups: Yes  Participating in groups: Yes  Taking medication as prescribed: Yes  Tolerating medication: Yes  Family/Significant other contact made: Yes, CSW has spoken with the pt's friend  Patient understands diagnosis: Yes  Discussing patient identified problems/goals with staff: Yes  Medical problems stabilized or resolved: Yes  Denies suicidal/homicidal ideation: Yes  Issues/concerns per patient self-inventory: Yes  Other:   New problem(s) identified: N/A   Discharge Plan or Barriers: Pt will discharge to the [facility] in [place], [place] for substance abuse treatment, medication management and therapy   Reason for Continuation of Hospitalization:   Depression   Anxiety   Medication Stabilization   Comments: N/A   Estimated date of discharge: 7/01/04/16     Patient is a. 45 y.o. male patient admitted with "I've been wanting to kill myself". This patient has history of alcoholism and multiple use of other substances. He has been hospitalized in our facility previous times under similar circumstances. Patient says that he has been homeless for the last 3-4 weeks he has been staying at the homeless shelter in [place]. Prior to that he was staying with his girlfriend of several years but they recently broke up.  Due to the end of the relationship his mood has worsened to the point that he feels he has no desire of leaving anymore. One of his friends petition him for psychiatric evaluation yesterday. He says that he had a suicidal plan but does not want to tell me about it.   Patient also reported a multitude of other symptoms some that don't really fit into any specific disorder he complains of having at times some hallucinations but then he will be fine for years and will not happen again. Currently he denies  any of these symptoms. Patient seems to try to minimize the severity of the substance use. He was very vague in answering his questions about substance abuse but elaborated with great details around when talking about mental illness. He denies drinking rubbing alcohol prior to admission however per day psychiatrist in the emergency department the patient told that he had drank rubbing alcohol yesterday as a suicidal attempt.  Today he has been working with the Education officer, museum and learned that he could go to a residential facility for substance abuse for 6-9 months. Patient says that after hearing this he has been feeling much more hopeful and is looking for work to be transferred from here to [facility]. Patient tells me he has not been following up with any psychiatrist or substance abuse program. Substance abuse history: Long history of severe alcohol abuse with abuse of at times extremely large amounts of alcohol. Judging from the chart it looks like he has started to moderate that'll little bit at least compared to how bad it was a year or more ago. Also has a long history of opiate addiction. He tells me he thinks the only time in his life he ever felt well was when he was abusing opiates. Documented history of abusing multiple other drugs as well. Patient tends to minimize the degree to which substances contributed to his problems.Patient is homeless in [place].  Pt now denies SI/HI/AVH. Patient will benefit from crisis stabilization, medication evaluation, group therapy, and psycho education in addition to case management for discharge planning. [name] reviewed pt's identified goals and  treatment plan. Pt verbalized understanding and agreed to treatment plan.    Review of initial/current patient goals per problem list:  1. Goal(s): Patient will participate in aftercare plan   Met: Yes  Target date: 3-5 days post admission date   As evidenced by: Patient will participate within aftercare  plan AEB aftercare provider and housing plan at discharge being identified.   7/17: Pt will discharge to the [facility] in [place], [place] for substance abuse treatment, medication management and therapy   2. Goal (s): Patient will exhibit decreased depressive symptoms and suicidal ideations.   [id number] for discharge per MD.  Target date: 3-5 days post admission date   As evidenced by: Patient will utilize self-rating of depression at 3 or below and demonstrate decreased signs of depression or be deemed stable for discharge by MD.   7/17: Adequate for discharge per MD.    3. Goal(s): Patient will demonstrate decreased signs and symptoms of anxiety.   Met: Adequate for discharge per MD.  Target date: 3-5 days post admission date   As evidenced by: Patient will utilize self-rating of anxiety at 3 or below and demonstrated decreased signs of anxiety, or be deemed stable for discharge by MD   7/17: Adequate for discharge per MD.    4. Goal(s): Patient will demonstrate decreased signs of withdrawal due to substance abuse   Met: Yes  Target date: 3-5 days post admission date   As evidenced by: Patient will produce a CIWA/COWS score of 0, have stable vitals signs, and no symptoms of withdrawal   7/17: Patient produced a CIWA/COWS score of 0, has stable vitals signs, and no symptoms of withdrawal    5. Goal(s): Patient will demonstrate decreased signs of psychosis  * Met: Adequate for discharge per MD. * Target date: 3-5 days post admission date  * As evidenced by: Patient will demonstrate decreased frequency of AVH or return to baseline function   7/17: Adequate for discharge per MD.  od     Attendees:  Patient:  Family:  Physician: Jerilee Hoh, MD     07/13/2016 9:30 AM  Nursing: Lucile Shutters, RN     07/13/2016 9:30 AM  Clinical Social Worker: Marylou Flesher, [place]  07/13/2016 9:30 AM  Clinical Social Worker: Glorious Peach, [place] 07/13/2016 9:30 AM   Other:        07/13/2016 9:30 AM  Other:        07/13/2016 9:30 AM  Other:        07/13/2016 9:30 AM

## 2016-07-13 NOTE — [facility] Group Notes (Signed)
[facility] Group Notes:  (Nursing/MHT/Case Management/Adjunct)  Date:  07/13/2016  Time:  4:28 AM  Type of Therapy:  Psychoeducational Skills  Participation Level:  Active  Participation Quality:  Appropriate and Sharing  Affect:  Appropriate  Cognitive:  Appropriate  Insight:  Appropriate and Good  Engagement in Group:  Engaged and Improving  Modes of Intervention:  Discussion, Socialization and Support  Summary of Progress/Problems:  Curtis Cobb 07/13/2016, 4:28 AM

## 2016-07-13 NOTE — Progress Notes (Signed)
Patient denies SI/HI, denies A/V hallucinations. Patient verbalizes understanding of discharge instructions, follow up care and prescriptions.7 days medicine given to patient. Patient given all belongings from locker. Patient escorted out by staff, transported by family.

## 2016-08-11 ENCOUNTER — Emergency Department (HOSPITAL_COMMUNITY)
Admission: EM | Admit: 2016-08-11 | Discharge: 2016-08-11 | Disposition: A | Discharge status: Home / Self Care | Payer: Self-pay | Attending: Emergency Medicine | Admitting: Emergency Medicine

## 2016-08-11 ENCOUNTER — Encounter (HOSPITAL_COMMUNITY): Payer: Self-pay | Admitting: Emergency Medicine

## 2016-08-11 DIAGNOSIS — I10 Essential (primary) hypertension: Secondary | ICD-10-CM | POA: Insufficient documentation

## 2016-08-11 DIAGNOSIS — M069 Rheumatoid arthritis, unspecified: Secondary | ICD-10-CM

## 2016-08-11 DIAGNOSIS — M0689 Other specified rheumatoid arthritis, multiple sites: Secondary | ICD-10-CM | POA: Insufficient documentation

## 2016-08-11 MED ORDER — PREDNISONE 50 MG PO TABS
60.0000 mg | ORAL_TABLET | Freq: Once | ORAL | Status: AC
  Administered 2016-08-11: 60 mg via ORAL
  Filled 2016-08-11: qty 1

## 2016-08-11 MED ORDER — PREDNISONE 10 MG PO TABS: ORAL_TABLET | ORAL | 0 refills | Status: DC

## 2016-08-11 NOTE — ED Provider Notes (Signed)
[facility]-EMERGENCY DEPT Provider Note   CSN: [id number] Arrival date & time: 08/11/16  4097     History   Chief Complaint Chief Complaint  Patient presents with  . Joint Pain    HPI Curtis Cobb is a 45 y.o. male.  Curtis Cobb is a 45 y.o. Male presenting with bilateral hand, shoulder, knee and lower back aching pain which started gradually 5 days ago.  He endorses having rheumatoid arthritis which has been silent for the past 6 months, prior to this he had been on prednisone 20 mg daily for at least a year.  He denies fevers, chills, flu like symptoms and denies trauma, stating his symptoms are consistent with a rheumatoid flare.  He is currently staying at [facility] for et[facility] addiction treatment and [facility].  He denies IVDU and denies weakness, numbness in extremities and no urinary or fecal incontinence or retention.  He has had no treatment prior to arrival.      The history is provided by the patient.    Past Medical History:  Diagnosis Date  . Alcohol use disorder, moderate, dependence (HCC) 02/13/2014  . Anxiety   . Depression   . Hypertension   . Rheumatoid arthritis [facility])     Patient Active Problem List   Diagnosis Date Noted  . 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 MDD (major depressive disorder), recurrent episode, severe (HCC) 11/17/2015  . Opioid use disorder, mild, in sustained remission 10/08/2015  . Alcohol use disorder, severe, dependence (HCC) 10/07/2015  . Cannabis use disorder, severe, dependence (HCC) 10/07/2015  . 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 HTN (hypertension) 10/07/2015  . Rheumatoid arthritis (HCC) 10/07/2015    History reviewed. No pertinent surgical history.     Home Medications    Prior to Admission medications   Medication Sig Start Date End Date Taking?  Authorizing Provider  predniSONE (DELTASONE) 10 MG tablet Take 6 tabs daily by mouth for 2 day,  Then 5 tabs daily for 2 days,  4 tabs daily for 2 days,  3 tabs daily for 2 days,  2 tabs daily for 2 days,  Then 1 tab daily for 2 days. 08/12/16   Burgess Amor, PA-C  QUEtiapine (SEROQUEL) 300 MG tablet Take 1 tablet (300 mg total) by mouth at bedtime. 07/13/16   Jimmy Footman, MD    Family History Family History  Problem Relation Age of Onset  . Family history unknown: Yes    Social History Social History  Substance Use Topics  . Smoking status: Never Smoker  . Smokeless tobacco: Never Used  . Alcohol use No     Comment: Daily-Drank rubbing alcohol today     Allergies   Sulfa antibiotics   Review of Systems Review of Systems  Constitutional: Negative for chills and fever.  Respiratory: Negative.   Cardiovascular: Negative.   Gastrointestinal: Negative.   Musculoskeletal: Positive for arthralgias, back pain and joint swelling. Negative for myalgias, neck pain and neck stiffness.  Skin: Negative for rash.  Neurological: Negative for weakness and numbness.     Physical Exam Updated Vital Signs BP 130/90 (BP Location: Left Arm)   Pulse 106   Temp 98.3 F (36.8 C) (Oral)   Resp 16   Ht 6\' 2"  (1.88 m)   Wt 99.8 kg   SpO2 99%   BMI 28.25 kg/m  Physical Exam  Constitutional: He appears well-developed and well-nourished.  HENT:  Head: Atraumatic.  Eyes: Conjunctivae are normal.  Neck: Normal range of motion.  Cardiovascular: Normal rate.   Pulses equal bilaterally  Pulmonary/Chest: Effort normal.  Musculoskeletal: He exhibits tenderness. He exhibits no deformity.  ttp across bilateral dorsal mcp's of both hands, bilateral knees, shoulders.  Mild erythema noted mcp's only, not warm to touch. FROM of fingers, knees, shoulders.  No "hot" joints appreciated.   Neurological: He is alert. He has normal strength. He displays normal reflexes. No sensory deficit.    Skin: Skin is warm and dry.  Psychiatric: He has a normal mood and affect.     ED Treatments / Results  Labs (all labs ordered are listed, but only abnormal results are displayed) Labs Reviewed - No data to display  EKG  EKG Interpretation None       Radiology No results found.  Procedures Procedures (including critical care time)  Medications Ordered in ED Medications  predniSONE (DELTASONE) tablet 60 mg (not administered)     Initial Impression / Assessment and Plan / ED Course  I have reviewed the triage vital signs and the nursing notes.  Pertinent labs & imaging results that were available during my care of the patient were reviewed by me and considered in my medical decision making (see chart for details).  Clinical Course    Pt with known h/o rheumatoid arthritis with new flare.  Will start him on 12 prednisone taper with first dose given today.  He was encouraged to f[facility]u with [facility] asap, if this is delayed, returning here for any worsening problems or concerns.  No exam evidence of septic joint(s).  Multiple joint involvement with known rheumatoid arthritis makes this condition most likely cause of current sx.    Final Clinical Impressions(s) / ED Diagnoses   Final diagnoses:  Rheumatoid arthritis involving multiple sites, unspecified rheumatoid factor presence (HCC)    New Prescriptions New Prescriptions   PREDNISONE (DELTASONE) 10 MG TABLET    Take 6 tabs daily by mouth for 2 day,  Then 5 tabs daily for 2 days,  4 tabs daily for 2 days,  3 tabs daily for 2 days,  2 tabs daily for 2 days,  Then 1 tab daily for 2 days.     Burgess Amor, PA-C 08/11/16 1005    Glynn Octave, MD 08/11/16 [phone]

## 2016-08-11 NOTE — ED Triage Notes (Signed)
Pt comes in today w/ c/o joint pain and swelling bilaterally. Hx of rheumatoid arthritis, per pt previously on regimine of Prednisone 20mg  daily approx 6 months ago but currently no medications are being taken. Comes in today as pain has increased making it hard to do ADL's. Pt AOx4.

## 2016-08-13 DIAGNOSIS — Z139 Encounter for screening, unspecified: Secondary | ICD-10-CM

## 2016-08-18 ENCOUNTER — Ambulatory Visit: Payer: Self-pay | Admitting: Physician Assistant

## 2016-08-18 ENCOUNTER — Encounter: Payer: Self-pay | Admitting: Physician Assistant

## 2016-08-18 VITALS — BP 124/74 | HR 91 | Temp 97.0°F | Ht 73.0 in | Wt 209.5 lb

## 2016-08-18 DIAGNOSIS — M069 Rheumatoid arthritis, unspecified: Secondary | ICD-10-CM

## 2016-08-18 DIAGNOSIS — Z131 Encounter for screening for diabetes mellitus: Secondary | ICD-10-CM

## 2016-08-18 DIAGNOSIS — E876 Hypokalemia: Secondary | ICD-10-CM

## 2016-08-18 DIAGNOSIS — F1910 Other psychoactive substance abuse, uncomplicated: Secondary | ICD-10-CM

## 2016-08-18 DIAGNOSIS — F609 Personality disorder, unspecified: Secondary | ICD-10-CM

## 2016-08-18 LAB — GLUCOSE, POCT (MANUAL RESULT ENTRY): POC GLUCOSE: 98 mg/dL (ref 70–99)

## 2016-08-18 MED ORDER — PREDNISONE 20 MG PO TABS: 20.0000 mg | ORAL_TABLET | Freq: Every day | ORAL | 0 refills | Status: DC

## 2016-08-18 NOTE — Progress Notes (Signed)
BP 124/74 (BP Location: Left Arm, Patient Position: Sitting, Cuff Size: Normal)   Pulse 91   Temp 97 F (36.1 C)   Ht 6\' 1"  (1.854 m)   Wt 209 lb 8 oz (95 kg)   SpO2 97%   BMI 27.64 kg/m    Subjective:    Patient ID: , male    DOB: [birth year], 45 y.o.   MRN: [id number]  HPI: Curtis Cobb is a 45 y.o. male presenting on 08/18/2016 for New Patient (Initial Visit)   HPI   Pt is new to our practice today.  He says his main concern is his RA.  He says he was diagnosed 1-2 years ago by rheumatologist in [facility].  He only went to that doctor one time because she wanted to put him on methotrexate and he didn't want to take it.  He says he was on prednisone 20mg  daily for over a year.  He says his psychiatrist wrote it for him    Pt currently at [facility]. He [facility]  Pt went to ER on 08/11/16 and given prednisone taper.  Relevant past medical, surgical, family and social history reviewed and updated as indicated. Interim medical history since our last visit reviewed. Allergies and medications reviewed and updated.   Current Outpatient Prescriptions:  .  predniSONE (DELTASONE) 10 MG tablet, Take 6 tabs daily by mouth for 2 day,  Then 5 tabs daily for 2 days,  4 tabs daily for 2 days,  3 tabs daily for 2 days,  2 tabs daily for 2 days,  Then 1 tab daily for 2 days., Disp: 42 tablet, Rfl: 0 .  QUEtiapine (SEROQUEL) 300 MG tablet, Take 1 tablet (300 mg total) by mouth at bedtime., Disp: 30 tablet, Rfl: 0  Review of Systems  Constitutional: Positive for fatigue. Negative for appetite change, chills, diaphoresis, fever and unexpected weight change.  HENT: Negative for congestion, drooling, ear pain, facial swelling, hearing loss, mouth sores, sneezing, sore throat, trouble swallowing and voice change.   Eyes: Negative for pain, discharge, redness, itching and visual disturbance.  Respiratory: Negative for cough, choking, shortness of breath and wheezing.    Cardiovascular: Negative for chest pain, palpitations and leg swelling.  Gastrointestinal: Negative for abdominal pain, blood in stool, constipation, diarrhea and vomiting.  Endocrine: Negative for cold intolerance, heat intolerance and polydipsia.  Genitourinary: Negative for decreased urine volume, dysuria and hematuria.  Musculoskeletal: Positive for arthralgias and gait problem. Negative for back pain.  Skin: Negative for rash.  Allergic/Immunologic: Negative for environmental allergies.  Neurological: Negative for seizures, syncope, light-headedness and headaches.  Hematological: Negative for adenopathy.  Psychiatric/Behavioral: Positive for dysphoric mood. Negative for agitation and suicidal ideas. The patient is nervous/anxious.     Per HPI unless specifically indicated above     Objective:    BP 124/74 (BP Location: Left Arm, Patient Position: Sitting, Cuff Size: Normal)   Pulse 91   Temp 97 F (36.1 C)   Ht 6\' 1"  (1.854 m)   Wt 209 lb 8 oz (95 kg)   SpO2 97%   BMI 27.64 kg/m   Wt Readings from Last 3 Encounters:  08/18/16 209 lb 8 oz (95 kg)  08/11/16 220 lb (99.8 kg)  07/09/16 205 lb (93 kg)    Physical Exam  Constitutional: He is oriented to person, place, and time. He appears well-developed and well-nourished.  HENT:  Head: Normocephalic and atraumatic.  Mouth/Throat: Oropharynx is clear and moist.  No oropharyngeal exudate.  Eyes: Conjunctivae and EOM are normal. Pupils are equal, round, and reactive to light.  Neck: Neck supple. No thyromegaly present.  Cardiovascular: Normal rate and regular rhythm.   Pulmonary/Chest: Effort normal and breath sounds normal. He has no wheezes. He has no rales.  Abdominal: Soft. Bowel sounds are normal. He exhibits no mass. There is no hepatosplenomegaly. There is no tenderness.  Musculoskeletal: He exhibits no edema.  No joint swelling noted today  Lymphadenopathy:    He has no cervical adenopathy.  Neurological: He is  alert and oriented to person, place, and time.  Skin: Skin is warm and dry. No rash noted.  Psychiatric: He has a normal mood and affect. His behavior is normal. Thought content normal.  Vitals reviewed.   Results for orders placed or performed in visit on 08/18/16  POCT Glucose (CBG)  Result Value Ref Range   POC Glucose 98 70 - 99 mg/dl      Assessment & Plan:   Encounter Diagnoses  Name Primary?  . Rheumatoid arthritis, involving unspecified site, unspecified rheumatoid factor presence (HCC) Yes  . Hypokalemia   . Polysubstance abuse   . Personality disorder cluster b   . Screening for diabetes mellitus (DM)     -Check K+ today.  Labs reviewed and no other needed today  -Negative Rheumatoid factor 03/27/16  -gave rx Prednisone 20mg  x 14 d.  Discussed with pt that prednisone is not appropriate long-term medication but will given him this today while we are working on referral to rheumatologist.  Record request sent to doctor in [place] that he saw. -Refer to rheumatology -F/u 1 month.  RTO sooner prn

## 2016-09-05 ENCOUNTER — Emergency Department (HOSPITAL_COMMUNITY)
Admission: EM | Admit: 2016-09-05 | Discharge: 2016-09-05 | Disposition: A | Discharge status: Home / Self Care | Payer: Self-pay | Attending: Emergency Medicine | Admitting: Emergency Medicine

## 2016-09-05 ENCOUNTER — Encounter (HOSPITAL_COMMUNITY): Payer: Self-pay | Admitting: Emergency Medicine

## 2016-09-05 DIAGNOSIS — I10 Essential (primary) hypertension: Secondary | ICD-10-CM | POA: Insufficient documentation

## 2016-09-05 DIAGNOSIS — M06862 Other specified rheumatoid arthritis, left knee: Secondary | ICD-10-CM | POA: Insufficient documentation

## 2016-09-05 DIAGNOSIS — M06811 Other specified rheumatoid arthritis, right shoulder: Secondary | ICD-10-CM | POA: Insufficient documentation

## 2016-09-05 DIAGNOSIS — M06871 Other specified rheumatoid arthritis, right ankle and foot: Secondary | ICD-10-CM | POA: Insufficient documentation

## 2016-09-05 DIAGNOSIS — M549 Dorsalgia, unspecified: Secondary | ICD-10-CM | POA: Insufficient documentation

## 2016-09-05 DIAGNOSIS — M0689 Other specified rheumatoid arthritis, multiple sites: Secondary | ICD-10-CM

## 2016-09-05 DIAGNOSIS — M06872 Other specified rheumatoid arthritis, left ankle and foot: Secondary | ICD-10-CM | POA: Insufficient documentation

## 2016-09-05 DIAGNOSIS — Z79899 Other long term (current) drug therapy: Secondary | ICD-10-CM | POA: Insufficient documentation

## 2016-09-05 DIAGNOSIS — M06861 Other specified rheumatoid arthritis, right knee: Secondary | ICD-10-CM | POA: Insufficient documentation

## 2016-09-05 DIAGNOSIS — F329 Major depressive disorder, single episode, unspecified: Secondary | ICD-10-CM | POA: Insufficient documentation

## 2016-09-05 DIAGNOSIS — M06841 Other specified rheumatoid arthritis, right hand: Secondary | ICD-10-CM | POA: Insufficient documentation

## 2016-09-05 DIAGNOSIS — M06812 Other specified rheumatoid arthritis, left shoulder: Secondary | ICD-10-CM | POA: Insufficient documentation

## 2016-09-05 DIAGNOSIS — M06842 Other specified rheumatoid arthritis, left hand: Secondary | ICD-10-CM | POA: Insufficient documentation

## 2016-09-05 MED ORDER — DEXAMETHASONE SODIUM PHOSPHATE 4 MG/ML IJ SOLN
8.0000 mg | Freq: Once | INTRAMUSCULAR | Status: AC
  Administered 2016-09-05: 8 mg via INTRAMUSCULAR
  Filled 2016-09-05: qty 2

## 2016-09-05 MED ORDER — PREDNISONE 10 MG PO TABS: ORAL_TABLET | ORAL | 0 refills | Status: DC

## 2016-09-05 NOTE — Discharge Instructions (Signed)
You were treated with intramuscular Decadron in the emergency department. Please use prednisone taper with food daily.

## 2016-09-05 NOTE — ED Provider Notes (Signed)
[facility]-EMERGENCY DEPT Provider Note   CSN: [id number] Arrival date & time: 09/05/16  0945     History   Chief Complaint Chief Complaint  Patient presents with  . Joint Pain    HPI Curtis Cobb is a 45 y.o. male.  Patient is a 45 year old male who presents to the emergency department with a complaint of multiple joint pain.  The patient states that he has a history of rheumatoid arthritis. He states that from time to time he has flareups of his rheumatoid arthritis. He reports being seen in the emergency department approximately a month ago for the same. He has tried anti-inflammatory medications in the past, haste states that at this point they do absolutely nothing for him. He does get some relief from steroid medications. He has a history of on alcohol use disorder as well as substance abuse, and he does not use any other medications. He has not had any recent injury or trauma to any of the joints. There's been no procedures performed on any of his joints recently.      Past Medical History:  Diagnosis Date  . Alcohol use disorder, moderate, dependence (HCC) 02/13/2014  . Anxiety   . Depression   . Hypertension   . Rheumatoid arthritis (HCC)   . Substance abuse     Patient Active Problem List   Diagnosis Date Noted  . 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 MDD (major depressive disorder), recurrent episode, severe (HCC) 11/17/2015  . Opioid use disorder, mild, in sustained remission 10/08/2015  . Alcohol use disorder, severe, dependence (HCC) 10/07/2015  . Cannabis use disorder, severe, dependence (HCC) 10/07/2015  . HTN (hypertension) 10/07/2015  . Rheumatoid arthritis (HCC) 10/07/2015    History reviewed. No pertinent surgical history.     Home Medications    Prior to Admission medications   Medication Sig Start Date End Date Taking? Authorizing Provider  predniSONE  (DELTASONE) 10 MG tablet Take 6 tabs daily by mouth for 2 day,  Then 5 tabs daily for 2 days,  4 tabs daily for 2 days,  3 tabs daily for 2 days,  2 tabs daily for 2 days,  Then 1 tab daily for 2 days. 08/12/16   Burgess Amor, PA-C  predniSONE (DELTASONE) 20 MG tablet Take 1 tablet (20 mg total) by mouth daily with breakfast. 08/18/16   Jacquelin Hawking, PA-C  QUEtiapine (SEROQUEL) 300 MG tablet Take 1 tablet (300 mg total) by mouth at bedtime. 07/13/16   Jimmy Footman, MD    Family History Family History  Problem Relation Age of Onset  . Heart attack Mother   . Stroke Mother   . Hypertension Mother   . Mental illness Mother     Manic Depressive Bipolar Disorder  . Heart disease Mother   . Hypertension Maternal Aunt   . Mental illness Maternal Aunt   . Hypertension Maternal Aunt   . Mental illness Maternal Aunt     Social History Social History  Substance Use Topics  . Smoking status: Never Smoker  . Smokeless tobacco: Never Used  . Alcohol use No     Comment: alcoholic.  sober since 07-09-16     Allergies   Sulfa antibiotics   Review of Systems Review of Systems  Constitutional: Negative for chills and fever.  Musculoskeletal: Positive for arthralgias, back pain and joint swelling.  Psychiatric/Behavioral:       Depression  All other  systems reviewed and are negative.    Physical Exam Updated Vital Signs BP (!) 149/105 (BP Location: Right Arm)   Pulse 94   Temp 98 F (36.7 C) (Oral)   Resp 16   Ht 6\' 2"  (1.88 m)   Wt 97.5 kg   SpO2 97%   BMI 27.60 kg/m   Physical Exam  Constitutional: He is oriented to person, place, and time. He appears well-developed and well-nourished.  Non-toxic appearance.  HENT:  Head: Normocephalic.  Right Ear: Tympanic membrane and external ear normal.  Left Ear: Tympanic membrane and external ear normal.  Eyes: EOM and lids are normal. Pupils are equal, round, and reactive to light.  Neck: Normal range of motion. Neck  supple. Carotid bruit is not present.  Cardiovascular: Normal rate, regular rhythm, normal heart sounds, intact distal pulses and normal pulses.   Pulmonary/Chest: Breath sounds normal. No respiratory distress.  Abdominal: Soft. Bowel sounds are normal. There is no tenderness. There is no guarding.  Musculoskeletal: Normal range of motion.  There is slight decrease in range of motion of both shoulders, both knees. There is crepitus with attempted range of motion of the shoulders, wrists, and knees. There is mild-to-moderate swelling of the wrist and hands. There is tenderness to palpation of all the joints.  Lymphadenopathy:       Head (right side): No submandibular adenopathy present.       Head (left side): No submandibular adenopathy present.    He has no cervical adenopathy.  Neurological: He is alert and oriented to person, place, and time. He has normal strength. No cranial nerve deficit or sensory deficit.  Skin: Skin is warm and dry.  Psychiatric: He has a normal mood and affect. His speech is normal.  Nursing note and vitals reviewed.    ED Treatments / Results  Labs (all labs ordered are listed, but only abnormal results are displayed) Labs Reviewed - No data to display  EKG  EKG Interpretation None       Radiology No results found.  Procedures Procedures (including critical care time)  Medications Ordered in ED Medications - No data to display   Initial Impression / Assessment and Plan / ED Course  I have reviewed the triage vital signs and the nursing notes.  Pertinent labs & imaging results that were available during my care of the patient were reviewed by me and considered in my medical decision making (see chart for details).  Clinical Course    *I have reviewed nursing notes, vital signs, and all appropriate lab and imaging results for this patient.**  Final Clinical Impressions(s) / ED Diagnoses  Patient has a history of degenerative joint disease  and rheumatoid arthritis. He has had some improvement with steroid medications on. Intramuscular Decadron given here in the emergency department. Prednisone taper given to the patient.    Final diagnoses:  None    New Prescriptions New Prescriptions   No medications on file     , PA-C 09/05/16 1038    11/05/16, MD 09/06/16 1301

## 2016-09-05 NOTE — ED Triage Notes (Signed)
Reports hx of rheumatoid arthritis with pain in bilateral arms, shoulders, hands, knees, and feet.

## 2016-09-08 NOTE — Congregational Nurse Program (Signed)
Congregational Nurse Program Note  Date of Encounter: 08/13/2016  Past Medical History: Past Medical History:  Diagnosis Date  . Alcohol use disorder, moderate, dependence (HCC) 02/13/2014  . Anxiety   . Depression   . Hypertension   . Rheumatoid arthritis (HCC)   . Substance abuse     Encounter Details:     CNP Questionnaire - 08/13/16 1000      Patient Demographics   Is this a new or existing patient? New   Patient is considered a/an Not Applicable   Race Caucasian/White     Patient Assistance   Location of Patient Assistance [facility] [employer], [place]   Patient's financial/insurance status Low Income   Uninsured Patient Yes   Interventions Assisted patient in making appt.;Counseled to make appt. with provider   Patient referred to apply for the following financial assistance Not Applicable   Food insecurities addressed Not Applicable   Transportation assistance No   Assistance securing medications No   Educational health offerings Navigating the healthcare system;Chronic disease     Encounter Details   Primary purpose of visit Chronic Illness/Condition Visit;Navigating the Healthcare System;Education/Health Concerns   Was an Emergency Department visit averted? Not Applicable   Does patient have a medical provider? No   Patient referred to Clinic;Establish PCP   Was a mental health screening completed? (GAINS tool) Yes   Was a mental health referral made? No   Does patient have dental issues? No   Does patient have vision issues? No   Does your patient have an abnormal blood pressure today? No   Since previous encounter, have you referred patient for abnormal blood pressure that resulted in a new diagnosis or medication change? No   Does your patient have an abnormal blood glucose today? No   Since previous encounter, have you referred patient for abnormal blood glucose that resulted in a new diagnosis or medication change? No   Was there a life-saving intervention  made? No      New client to the [facility]. Client is currently residing at [facility] house for treatment of substance abuse. Allergies: Sulfa PMH: Rheumatoid arthritis diagnosed 2 years ago. Recent flare with swollen joints while at [facility] that resulted in an ER visit and subsequent treatment with prednisone. Today client states joints are not swollen and are feeling better. He is receiving mental health treatment at [facility] and is being treated for depression and insomnia. He is also in the outpatient program there for substance abuse. No other complaints at this time. Client requests a primary care provider for management of his arthritis as well as preventative care. Client wishes to proceed with becoming a client at the [facility] due to close proximity to [facility]. Referral made and appointment secured for client. Appointment scheduled for 08/18/16 at 1315pm. Will follow as needed.

## 2016-09-13 ENCOUNTER — Encounter (HOSPITAL_COMMUNITY): Payer: Self-pay | Admitting: Emergency Medicine

## 2016-09-13 ENCOUNTER — Emergency Department (HOSPITAL_COMMUNITY)
Admission: EM | Admit: 2016-09-13 | Discharge: 2016-09-13 | Disposition: A | Discharge status: Home / Self Care | Payer: Self-pay | Attending: Emergency Medicine | Admitting: Emergency Medicine

## 2016-09-13 DIAGNOSIS — Z8739 Personal history of other diseases of the musculoskeletal system and connective tissue: Secondary | ICD-10-CM

## 2016-09-13 DIAGNOSIS — I10 Essential (primary) hypertension: Secondary | ICD-10-CM | POA: Insufficient documentation

## 2016-09-13 DIAGNOSIS — R531 Weakness: Secondary | ICD-10-CM

## 2016-09-13 DIAGNOSIS — Z79899 Other long term (current) drug therapy: Secondary | ICD-10-CM | POA: Insufficient documentation

## 2016-09-13 LAB — COMPREHENSIVE METABOLIC PANEL
ALBUMIN: 4.3 g/dL (ref 3.5–5.0)
ALK PHOS: 47 U/L (ref 38–126)
ALT: 13 U/L — AB (ref 17–63)
AST: 20 U/L (ref 15–41)
Anion gap: 11 (ref 5–15)
BUN: 16 mg/dL (ref 6–20)
CALCIUM: 9.4 mg/dL (ref 8.9–10.3)
CHLORIDE: 103 mmol/L (ref 101–111)
CO2: 27 mmol/L (ref 22–32)
CREATININE: 0.96 mg/dL (ref 0.61–1.24)
GFR calc non Af Amer: >60 mL/min (ref >60)
GLUCOSE: 96 mg/dL (ref 65–99)
Potassium: 4.6 mmol/L (ref 3.5–5.1)
SODIUM: 141 mmol/L (ref 135–145)
Total Bilirubin: 0.5 mg/dL (ref 0.3–1.2)
Total Protein: 8.1 g/dL (ref 6.5–8.1)

## 2016-09-13 LAB — CBC WITH DIFFERENTIAL/PLATELET
BASOS ABS: 0 10*3/uL (ref 0.0–0.1)
BASOS PCT: 0 %
EOS ABS: 0.2 10*3/uL (ref 0.0–0.7)
EOS PCT: 3 %
HCT: 42.4 % (ref 39.0–52.0)
HEMOGLOBIN: 13.3 g/dL (ref 13.0–17.0)
Lymphocytes Relative: 20 %
Lymphs Abs: 1.5 10*3/uL (ref 0.7–4.0)
MCH: 27.2 pg (ref 26.0–34.0)
MCHC: 31.4 g/dL (ref 30.0–36.0)
MCV: 86.7 fL (ref 78.0–100.0)
Monocytes Absolute: 0.6 10*3/uL (ref 0.1–1.0)
Monocytes Relative: 8 %
NEUTROS PCT: 69 %
Neutro Abs: 5 10*3/uL (ref 1.7–7.7)
PLATELETS: 350 10*3/uL (ref 150–400)
RBC: 4.89 MIL/uL (ref 4.22–5.81)
RDW: 14.5 % (ref 11.5–15.5)
WBC: 7.2 10*3/uL (ref 4.0–10.5)

## 2016-09-13 MED ORDER — PREDNISONE 10 MG PO TABS: ORAL_TABLET | ORAL | 0 refills | Status: DC

## 2016-09-13 MED ORDER — DEXAMETHASONE SODIUM PHOSPHATE 10 MG/ML IJ SOLN
10.0000 mg | Freq: Once | INTRAMUSCULAR | Status: AC
  Administered 2016-09-13: 10 mg via INTRAMUSCULAR
  Filled 2016-09-13: qty 1

## 2016-09-13 NOTE — Discharge Instructions (Signed)
Prednisone as prescribed.  Please follow-up with a rheumatologist in the near future to discuss treatment of your arthritis.

## 2016-09-13 NOTE — ED Triage Notes (Signed)
Patient c/o generalized weakness/fatigue with muscle cramps and joint pain. Patient recently seen here in ER on 9/9 for arthritis pain. Patient given prednisone, per patient was better while taking medication but states that pain started back shortly after completing medication. Patient denies being bit by any ticks. Patient unsure of any fevers reports the feeling of being "hot" intermittently.

## 2016-09-13 NOTE — ED Provider Notes (Signed)
[facility] DEPT Provider Note   CSN: [id number] Arrival date & time: 09/13/16  1257     History   Chief Complaint Chief Complaint  Patient presents with  . Fatigue    HPI Curtis Cobb is a 45 y.o. male.  Patient is a 45 year old male with past medical history of rheumatoid arthritis. He presents for evaluation of generalized joint pain, feeling fatigued, and arms going numb at night. He states that this has been going on for the past several weeks. He was recently treated with a course of prednisone which helped somewhat. He is waiting a referral to a rheumatologist. He denies any fevers or chills.      Past Medical History:  Diagnosis Date  . Alcohol use disorder, moderate, dependence (HCC) 02/13/2014  . Anxiety   . Depression   . Hypertension   . Rheumatoid arthritis (HCC)   . Substance abuse     Patient Active Problem List   Diagnosis Date Noted  . 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 MDD (major depressive disorder), recurrent episode, severe (HCC) 11/17/2015  . Opioid use disorder, mild, in sustained remission 10/08/2015  . Alcohol use disorder, severe, dependence (HCC) 10/07/2015  . Cannabis use disorder, severe, dependence (HCC) 10/07/2015  . HTN (hypertension) 10/07/2015  . Rheumatoid arthritis (HCC) 10/07/2015    History reviewed. No pertinent surgical history.     Home Medications    Prior to Admission medications   Medication Sig Start Date End Date Taking? Authorizing Provider  QUEtiapine (SEROQUEL) 300 MG tablet Take 1 tablet (300 mg total) by mouth at bedtime. 07/13/16  Yes Jimmy Footman, MD  predniSONE (DELTASONE) 10 MG tablet 5,4,3,2,1 - take with food Start this medication on 09/06/16 Patient not taking: Reported on 09/13/2016 09/05/16   Ivery Quale, PA-C    Family History Family History  Problem Relation Age of Onset  . Heart  attack Mother   . Stroke Mother   . Hypertension Mother   . Mental illness Mother     Manic Depressive Bipolar Disorder  . Heart disease Mother   . Hypertension Maternal Aunt   . Mental illness Maternal Aunt   . Hypertension Maternal Aunt   . Mental illness Maternal Aunt     Social History Social History  Substance Use Topics  . Smoking status: Never Smoker  . Smokeless tobacco: Never Used  . Alcohol use No     Comment: alcoholic.  sober since 07-09-16     Allergies   Sulfa antibiotics   Review of Systems Review of Systems  All other systems reviewed and are negative.    Physical Exam Updated Vital Signs BP 149/86 (BP Location: Left Arm)   Pulse 108   Temp 98.9 F (37.2 C) (Oral)   Resp 18   Ht 6\' 2"  (1.88 m)   Wt 215 lb (97.5 kg)   SpO2 100%   BMI 27.60 kg/m   Physical Exam  Constitutional: He is oriented to person, place, and time. He appears well-developed and well-nourished. No distress.  HENT:  Head: Normocephalic and atraumatic.  Mouth/Throat: Oropharynx is clear and moist.  Neck: Normal range of motion. Neck supple.  Cardiovascular: Normal rate and regular rhythm.  Exam reveals no friction rub.   No murmur heard. Pulmonary/Chest: Effort normal and breath sounds normal. No respiratory distress. He has no wheezes. He has no rales.  Abdominal: Soft. Bowel sounds are normal. He exhibits no  distension. There is no tenderness.  Musculoskeletal: Normal range of motion. He exhibits no edema.  Neurological: He is alert and oriented to person, place, and time. Coordination normal.  Skin: Skin is warm and dry. He is not diaphoretic.  Nursing note and vitals reviewed.    ED Treatments / Results  Labs (all labs ordered are listed, but only abnormal results are displayed) Labs Reviewed  COMPREHENSIVE METABOLIC PANEL - Abnormal; Notable for the following:       Result Value   ALT 13 (*)    All other components within normal limits  CBC WITH  DIFFERENTIAL/PLATELET    EKG  EKG Interpretation None       Radiology No results found.  Procedures Procedures (including critical care time)  Medications Ordered in ED Medications - No data to display   Initial Impression / Assessment and Plan / ED Course  I have reviewed the triage vital signs and the nursing notes.  Pertinent labs & imaging results that were available during my care of the patient were reviewed by me and considered in my medical decision making (see chart for details).  Clinical Course    Patient requesting a dose of Decadron and a prednisone taper. These will be given. He is to follow-up with his rheumatologist. Nothing today appears emergent and I see no indication for further workup.  Final Clinical Impressions(s) / ED Diagnoses   Final diagnoses:  None    New Prescriptions New Prescriptions   No medications on file     Geoffery Lyons, MD 09/13/16 1516

## 2016-09-17 ENCOUNTER — Encounter: Payer: Self-pay | Admitting: Physician Assistant

## 2016-09-17 ENCOUNTER — Ambulatory Visit: Payer: Self-pay | Admitting: Physician Assistant

## 2016-09-17 VITALS — BP 130/88 | HR 90 | Temp 97.3°F | Ht 73.0 in | Wt 219.6 lb

## 2016-09-17 DIAGNOSIS — M2550 Pain in unspecified joint: Secondary | ICD-10-CM

## 2016-09-17 DIAGNOSIS — F1910 Other psychoactive substance abuse, uncomplicated: Secondary | ICD-10-CM

## 2016-09-17 DIAGNOSIS — F39 Unspecified mood [affective] disorder: Secondary | ICD-10-CM

## 2016-09-17 MED ORDER — PREDNISONE 10 MG PO TABS: 20.0000 mg | ORAL_TABLET | Freq: Every day | ORAL | 0 refills | Status: DC

## 2016-09-17 NOTE — Progress Notes (Signed)
BP 130/88 (BP Location: Left Arm, Patient Position: Sitting, Cuff Size: Large)   Pulse 90   Temp 97.3 F (36.3 C) (Other (Comment))   Ht 6\' 1"  (1.854 m)   Wt 219 lb 9.6 oz (99.6 kg)   SpO2 97%   BMI 28.97 kg/m    Subjective:    Patient ID: , male    DOB: Dec 10, 1971, 45 y.o.   MRN: [id number]  HPI: Curtis Cobb is a 45 y.o. male presenting on 09/17/2016 for Follow-up   HPI   Pt states diagnosed in february 2016 by rheumatologist that's office says he was never pt there. Labs in EPIC negative for RA.  Further investigation shows that the rheumatologist saw pt at [facility] in [place] [facility] (and not at her office)   Pt currently living at [facility] house.      Relevant past medical, surgical, family and social history reviewed and updated as indicated. Interim medical history since our last visit reviewed. Allergies and medications reviewed and updated.  CURRENT MEDS: seroquel  Review of Systems  Constitutional: Positive for fatigue. Negative for appetite change, chills, diaphoresis, fever and unexpected weight change.  HENT: Negative for congestion, drooling, ear pain, facial swelling, hearing loss, mouth sores, sneezing, sore throat, trouble swallowing and voice change.   Eyes: Negative for pain, discharge, redness, itching and visual disturbance.  Respiratory: Negative for cough, choking, shortness of breath and wheezing.   Cardiovascular: Positive for leg swelling. Negative for chest pain and palpitations.  Gastrointestinal: Negative for abdominal pain, blood in stool, constipation, diarrhea and vomiting.  Endocrine: Negative for cold intolerance, heat intolerance and polydipsia.  Genitourinary: Negative for decreased urine volume, dysuria and hematuria.  Musculoskeletal: Positive for arthralgias, back pain and gait problem.  Skin: Negative for rash.  Allergic/Immunologic: Negative for environmental allergies.  Neurological: Negative for seizures,  syncope, light-headedness and headaches.  Hematological: Negative for adenopathy.  Psychiatric/Behavioral: Negative for agitation, dysphoric mood and suicidal ideas. The patient is not nervous/anxious.     Per HPI unless specifically indicated above     Objective:    BP 130/88 (BP Location: Left Arm, Patient Position: Sitting, Cuff Size: Large)   Pulse 90   Temp 97.3 F (36.3 C) (Other (Comment))   Ht 6\' 1"  (1.854 m)   Wt 219 lb 9.6 oz (99.6 kg)   SpO2 97%   BMI 28.97 kg/m   Wt Readings from Last 3 Encounters:  09/17/16 219 lb 9.6 oz (99.6 kg)  09/13/16 215 lb (97.5 kg)  09/05/16 215 lb (97.5 kg)    Physical Exam  Constitutional: He is oriented to person, place, and time. He appears well-developed and well-nourished.  HENT:  Head: Normocephalic and atraumatic.  Neck: Neck supple.  Cardiovascular: Normal rate and regular rhythm.   Pulmonary/Chest: Effort normal and breath sounds normal. He has no wheezes.  Abdominal: Soft. Bowel sounds are normal. There is no hepatosplenomegaly. There is no tenderness.  Musculoskeletal: He exhibits no edema.  No swelled joints seen today  Lymphadenopathy:    He has no cervical adenopathy.  Neurological: He is alert and oriented to person, place, and time.  Skin: Skin is warm and dry.  Psychiatric: He has a normal mood and affect. His behavior is normal.  Vitals reviewed.       Assessment & Plan:   Encounter Diagnoses  Name Primary?  . Pain in joint, multiple sites Yes  . Mood disorder (HCC)   . Polysubstance abuse     -  discussed rheumatologist options- going to [facility]. Pt prefers [facility] -record request sent to [facility] in [place] for previous rheumatology records -pt given 2 wk of prednisone.  Discussed risks of long-term prednisone usage and why this is undesirable.  -f/u 1 month to recheck referral process.  RTO sooner prn

## 2016-10-10 ENCOUNTER — Encounter (HOSPITAL_COMMUNITY): Payer: Self-pay | Admitting: *Deleted

## 2016-10-10 ENCOUNTER — Emergency Department (HOSPITAL_COMMUNITY)
Admission: EM | Admit: 2016-10-10 | Discharge: 2016-10-10 | Disposition: A | Discharge status: Home / Self Care | Payer: Self-pay | Attending: Emergency Medicine | Admitting: Emergency Medicine

## 2016-10-10 DIAGNOSIS — M0520 Rheumatoid vasculitis with rheumatoid arthritis of unspecified site: Secondary | ICD-10-CM

## 2016-10-10 DIAGNOSIS — M069 Rheumatoid arthritis, unspecified: Secondary | ICD-10-CM | POA: Insufficient documentation

## 2016-10-10 DIAGNOSIS — I10 Essential (primary) hypertension: Secondary | ICD-10-CM | POA: Insufficient documentation

## 2016-10-10 MED ORDER — PREDNISONE 10 MG PO TABS: 20.0000 mg | ORAL_TABLET | Freq: Every day | ORAL | 0 refills | Status: DC

## 2016-10-10 MED ORDER — DEXAMETHASONE SODIUM PHOSPHATE 4 MG/ML IJ SOLN
10.0000 mg | Freq: Once | INTRAMUSCULAR | Status: AC
  Administered 2016-10-10: 10 mg via INTRAMUSCULAR
  Filled 2016-10-10: qty 3

## 2016-10-10 NOTE — Discharge Instructions (Signed)
Schedule appointment with rheumatologist for evaluation

## 2016-10-10 NOTE — ED Triage Notes (Signed)
Chronic joint pain

## 2016-10-10 NOTE — ED Notes (Signed)
Pt reports acute on chronic joint pain in bilateral shoulders, knees, ankles, back and hands due to RA. Pt reports he had a Rheumatologist that he used to see but his RA went into remission for 6 months and didn't see the doctor for awhile. Now his pain has been coming back for 2 months and he is working with his PCP to get referral to get appt to see Rheumatologist again. Pt had bloodwork from PCP recently and goes back to see them on Oct. 21 which is when he believes he will receive the referral. Per pt he has been coming to the ED for pain management with steroids until he can get referral.

## 2016-10-10 NOTE — ED Provider Notes (Signed)
[facility] DEPT Provider Note   CSN: [id number] Arrival date & time: 10/10/16  7116     History   Chief Complaint Chief Complaint  Patient presents with  . Joint Pain    HPI Curtis Cobb is a 45 y.o. male.  The history is provided by the patient. No language interpreter was used.  Hand Pain  This is a new problem. The problem occurs constantly. The problem has been rapidly worsening. Pertinent negatives include no chest pain. Nothing aggravates the symptoms. Nothing relieves the symptoms. He has tried nothing for the symptoms. The treatment provided moderate relief.   Pt complains of pain in his joints.  Pt reports he is having a flare up of his rheumatoid. Past Medical History:  Diagnosis Date  . Alcohol use disorder, moderate, dependence (HCC) 02/13/2014  . Anxiety   . Depression   . Hypertension   . Rheumatoid arthritis (HCC)   . Substance abuse     Patient Active Problem List   Diagnosis Date Noted  . 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 MDD (major depressive disorder), recurrent episode, severe (HCC) 11/17/2015  . Opioid use disorder, mild, in sustained remission 10/08/2015  . Alcohol use disorder, severe, dependence (HCC) 10/07/2015  . Cannabis use disorder, severe, dependence (HCC) 10/07/2015  . HTN (hypertension) 10/07/2015  . Rheumatoid arthritis (HCC) 10/07/2015    History reviewed. No pertinent surgical history.     Home Medications    Prior to Admission medications   Medication Sig Start Date End Date Taking? Authorizing Provider  predniSONE (DELTASONE) 10 MG tablet Take 2 tablets (20 mg total) by mouth daily with breakfast. 10/10/16   Elson Areas, PA-C  QUEtiapine (SEROQUEL) 300 MG tablet Take 1 tablet (300 mg total) by mouth at bedtime. 07/13/16   Jimmy Footman, MD    Family History Family History  Problem Relation Age of Onset  .  Heart attack Mother   . Stroke Mother   . Hypertension Mother   . Mental illness Mother     Manic Depressive Bipolar Disorder  . Heart disease Mother   . Hypertension Maternal Aunt   . Mental illness Maternal Aunt   . Hypertension Maternal Aunt   . Mental illness Maternal Aunt     Social History Social History  Substance Use Topics  . Smoking status: Never Smoker  . Smokeless tobacco: Never Used  . Alcohol use No     Comment: alcoholic.  sober since 07-09-16     Allergies   Sulfa antibiotics   Review of Systems Review of Systems  Cardiovascular: Negative for chest pain.  All other systems reviewed and are negative.    Physical Exam Updated Vital Signs BP 165/90 (BP Location: Left Arm)   Pulse 93   Temp 97.8 F (36.6 C) (Oral)   Resp 16   Ht 6\' 2"  (1.88 m)   Wt 104.3 kg   SpO2 97%   BMI 29.53 kg/m   Physical Exam  Constitutional: He appears well-developed and well-nourished.  HENT:  Head: Normocephalic and atraumatic.  Eyes: Conjunctivae are normal.  Neck: Neck supple.  Cardiovascular: Normal rate and regular rhythm.   No murmur heard. Pulmonary/Chest: Effort normal and breath sounds normal. No respiratory distress.  Abdominal: Soft. There is no tenderness.  Musculoskeletal: He exhibits no edema.  Neurological: He is alert.  Skin: Skin is warm and dry.  Psychiatric: He has a normal mood and  affect.  Nursing note and vitals reviewed.    ED Treatments / Results  Labs (all labs ordered are listed, but only abnormal results are displayed) Labs Reviewed - No data to display  EKG  EKG Interpretation None       Radiology No results found.  Procedures Procedures (including critical care time)  Medications Ordered in ED Medications  dexamethasone (DECADRON) injection 10 mg (not administered)     Initial Impression / Assessment and Plan / ED Course  I have reviewed the triage vital signs and the nursing notes.  Pertinent labs & imaging  results that were available during my care of the patient were reviewed by me and considered in my medical decision making (see chart for details).  Clinical Course   Pt request decadron injection and prednisone.  Symptoms are chronic.  Final Clinical Impressions(s) / ED Diagnoses   Final diagnoses:  Rheumatoid arteritis    New Prescriptions Current Discharge Medication List       Elson Areas, PA-C 10/10/16 1110    Bethann Berkshire, MD 10/11/16 [phone]

## 2016-10-19 ENCOUNTER — Encounter: Payer: Self-pay | Admitting: Physician Assistant

## 2016-10-19 ENCOUNTER — Ambulatory Visit: Payer: Self-pay | Admitting: Physician Assistant

## 2016-10-19 VITALS — BP 126/76 | HR 114 | Temp 97.9°F | Ht 73.0 in | Wt 230.0 lb

## 2016-10-19 DIAGNOSIS — M2550 Pain in unspecified joint: Secondary | ICD-10-CM

## 2016-10-19 DIAGNOSIS — F1911 Other psychoactive substance abuse, in remission: Secondary | ICD-10-CM

## 2016-10-19 DIAGNOSIS — F39 Unspecified mood [affective] disorder: Secondary | ICD-10-CM

## 2016-10-19 MED ORDER — PREDNISONE 10 MG PO TABS: 20.0000 mg | ORAL_TABLET | Freq: Every day | ORAL | 0 refills | Status: AC

## 2016-10-19 NOTE — Progress Notes (Signed)
BP 126/76 (BP Location: Right Arm, Patient Position: Sitting, Cuff Size: Normal)   Pulse (!) 114   Temp 97.9 F (36.6 C)   Ht 6\' 1"  (1.854 m)   Wt 230 lb (104.3 kg)   SpO2 96%   BMI 30.34 kg/m    Subjective:    Patient ID: , male    DOB: [birth year], 45 y.o.   MRN: [id number]  HPI: Curtis Cobb is a 45 y.o. male presenting on 10/19/2016 for Follow-up   HPI   Records finally received from the [facility] in [place] where he was only seen twice.  Discussed with pt that he only saw dr June once and she didn't diagnose him with Rheumatoid Arthritis.  Pt carries this diagnosis by reported history only from what I can determine.   Pt states he has been on prednisone mosty since then (january 2016).  He goes to various ERs and gets more prednisone.  EPIC  shows only psychiatric visits in 2016 and no ER visit until 07/2016 for "RA".   Pt states that when he is not taking prednisone, his pain and joint swelling is so severe that he can barely function.  Says that while he is taking prednisone (says he needs at least 20mg /day, that 10mg /daily isn't enough to help him) his pain levels go down to almost nothing.  Have been unable to secure appointment with rheumatologist in [place] or at [facility] at this point.  Pt is still living at [facility] house.  Relevant past medical, surgical, family and social history reviewed and updated as indicated. Interim medical history since our last visit reviewed. Allergies and medications reviewed and updated.   Current Outpatient Prescriptions:  .  QUEtiapine (SEROQUEL) 300 MG tablet, Take 1 tablet (300 mg total) by mouth at bedtime., Disp: 30 tablet, Rfl: 0 .  predniSONE (DELTASONE) 10 MG tablet, Take 2 tablets (20 mg total) by mouth daily with breakfast. (Patient not taking: Reported on 10/19/2016), Disp: 28 tablet, Rfl: 0   Review of Systems  Constitutional: Positive for fatigue. Negative for appetite change, chills, diaphoresis,  fever and unexpected weight change.  HENT: Negative for congestion, dental problem, drooling, ear pain, facial swelling, hearing loss, mouth sores, sneezing, sore throat, trouble swallowing and voice change.   Eyes: Negative for pain, discharge, redness, itching and visual disturbance.  Respiratory: Negative for cough, choking, shortness of breath and wheezing.   Cardiovascular: Negative for chest pain, palpitations and leg swelling.  Gastrointestinal: Negative for abdominal pain, blood in stool, constipation, diarrhea and vomiting.  Endocrine: Negative for cold intolerance, heat intolerance and polydipsia.  Genitourinary: Negative for decreased urine volume, dysuria and hematuria.  Musculoskeletal: Positive for back pain and gait problem. Negative for arthralgias.  Skin: Negative for rash.  Allergic/Immunologic: Negative for environmental allergies.  Neurological: Negative for seizures, syncope, light-headedness and headaches.  Hematological: Negative for adenopathy.  Psychiatric/Behavioral: Negative for agitation, dysphoric mood and suicidal ideas. The patient is not nervous/anxious.     Per HPI unless specifically indicated above     Objective:    BP 126/76 (BP Location: Right Arm, Patient Position: Sitting, Cuff Size: Normal)   Pulse (!) 114   Temp 97.9 F (36.6 C)   Ht 6\' 1"  (1.854 m)   Wt 230 lb (104.3 kg)   SpO2 96%   BMI 30.34 kg/m   Wt Readings from Last 3 Encounters:  10/19/16 230 lb (104.3 kg)  10/10/16 230 lb (104.3 kg)  09/17/16 219 lb 9.6  oz (99.6 kg)    Physical Exam  Constitutional: He is oriented to person, place, and time. He appears well-developed and well-nourished.  HENT:  Head: Normocephalic and atraumatic.  Neck: Neck supple.  Cardiovascular: Normal rate and regular rhythm.   Pulmonary/Chest: Effort normal and breath sounds normal. He has no wheezes.  Abdominal: Soft. Bowel sounds are normal. There is no hepatosplenomegaly. There is no tenderness.   Musculoskeletal: He exhibits no edema.  No joint swelling observed  Lymphadenopathy:    He has no cervical adenopathy.  Neurological: He is alert and oriented to person, place, and time.  Skin: Skin is warm and dry.  Psychiatric: He has a normal mood and affect. His behavior is normal.  Vitals reviewed.       Assessment & Plan:    Encounter Diagnoses  Name Primary?  Marland Kitchen Arthralgia, unspecified joint Yes  . Mood disorder (HCC)   . Polydrug abuse in remission     -discussed with pt uncertain if he has RA or not.  Discussed his lack of diagnosis from dr Sharmon Revere and previous positive labs.  Has had nonspecific inflammatory response on previous labs.   -discussed with pt difficulty securing appointment with specialist for further evaluation.  pt agrees to go wherever he can get an appointment to be seen.   -pt is wanting another rx for prednisone.  Discussed with him that I will give him one last rx because of delays in referrals.  Discussed that I would be unable to continue this indefinitely due to uncertainty of diagnosis and significant risks with continuing prednisone indefinitely.  He states understanding -follow up one month to check on referral status.  RTO sooner prn  (The duration of this appointment visit was 25 minutes of face-to-face time with the patient.  Greater than 50% of this time was spent in counseling, explanation of diagnosis, planning of further management, and coordination of care.)

## 2016-11-15 ENCOUNTER — Encounter (HOSPITAL_COMMUNITY): Payer: Self-pay | Admitting: Emergency Medicine

## 2016-11-15 ENCOUNTER — Emergency Department (HOSPITAL_COMMUNITY)
Admission: EM | Admit: 2016-11-15 | Discharge: 2016-11-15 | Disposition: A | Discharge status: Home / Self Care | Payer: Self-pay | Attending: Emergency Medicine | Admitting: Emergency Medicine

## 2016-11-15 DIAGNOSIS — I10 Essential (primary) hypertension: Secondary | ICD-10-CM | POA: Insufficient documentation

## 2016-11-15 DIAGNOSIS — M069 Rheumatoid arthritis, unspecified: Secondary | ICD-10-CM

## 2016-11-15 DIAGNOSIS — M06831 Other specified rheumatoid arthritis, right wrist: Secondary | ICD-10-CM | POA: Insufficient documentation

## 2016-11-15 DIAGNOSIS — Z79899 Other long term (current) drug therapy: Secondary | ICD-10-CM | POA: Insufficient documentation

## 2016-11-15 MED ORDER — PREDNISONE 10 MG PO TABS: ORAL_TABLET | ORAL | 0 refills | Status: DC

## 2016-11-15 MED ORDER — DEXAMETHASONE SODIUM PHOSPHATE 10 MG/ML IJ SOLN
10.0000 mg | Freq: Once | INTRAMUSCULAR | Status: AC
  Administered 2016-11-15: 10 mg via INTRAMUSCULAR
  Filled 2016-11-15: qty 1

## 2016-11-15 NOTE — ED Triage Notes (Signed)
Pt reports generalized joint pain.  Chronic in nature.  States cold weather makes it worse.

## 2016-11-15 NOTE — Discharge Instructions (Signed)
Start taking your prednisone taper tomorrow.  Keep your appointments with your primary doctor and the rheumatologist you are scheduled to see.

## 2016-11-18 NOTE — ED Provider Notes (Signed)
[facility] DEPT Provider Note   CSN: [id number] Arrival date & time: 11/15/16  0940     History   Chief Complaint Chief Complaint  Patient presents with  . Joint Pain    HPI Curtis Cobb is a 45 y.o. male with a past medical history of rheumatoid arthritis involving multiple joints and is usually made worse with cold weather changes presenting for a rheumatoid flare, especially affecting his hands and wrists.  He is currently awaiting evaluation by a rheumatologist in [place] , with appt scheduled in the January.  In the interim, he has tried ibuprofen and aleve without relief. He reports a short prednisone taper is the best medicine to stop the flare.  He was unable to sleep last night secondary to aching pain.  He denies fevers, chills but does endorse mild hand swelling.  Denies injury.   The history is provided by the patient.    Past Medical History:  Diagnosis Date  . Alcohol use disorder, moderate, dependence (HCC) 02/13/2014  . Anxiety   . Depression   . Hypertension   . Rheumatoid arthritis (HCC)   . Substance abuse     Patient Active Problem List   Diagnosis Date Noted  . 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 MDD (major depressive disorder), recurrent episode, severe (HCC) 11/17/2015  . Opioid use disorder, mild, in sustained remission 10/08/2015  . Alcohol use disorder, severe, dependence (HCC) 10/07/2015  . Cannabis use disorder, severe, dependence (HCC) 10/07/2015  . HTN (hypertension) 10/07/2015  . Rheumatoid arthritis (HCC) 10/07/2015    History reviewed. No pertinent surgical history.     Home Medications    Prior to Admission medications   Medication Sig Start Date End Date Taking? Authorizing Provider  QUEtiapine (SEROQUEL) 300 MG tablet Take 1 tablet (300 mg total) by mouth at bedtime. 07/13/16  Yes Jimmy Footman, MD  predniSONE (DELTASONE)  10 MG tablet 6, 5, 4, 3, 2 then 1 tablet by mouth daily for 6 days total. 11/16/16   Burgess Amor, PA-C    Family History Family History  Problem Relation Age of Onset  . Heart attack Mother   . Stroke Mother   . Hypertension Mother   . Mental illness Mother     Manic Depressive Bipolar Disorder  . Heart disease Mother   . Hypertension Maternal Aunt   . Mental illness Maternal Aunt   . Hypertension Maternal Aunt   . Mental illness Maternal Aunt     Social History Social History  Substance Use Topics  . Smoking status: Never Smoker  . Smokeless tobacco: Never Used  . Alcohol use No     Comment: alcoholic.  sober since 07-09-16     Allergies   Sulfa antibiotics   Review of Systems Review of Systems  Constitutional: Negative for fever.  Musculoskeletal: Positive for arthralgias and joint swelling. Negative for myalgias.  Neurological: Negative for weakness and numbness.     Physical Exam Updated Vital Signs BP 140/93 (BP Location: Left Arm)   Pulse 113   Temp 98 F (36.7 C) (Oral)   Resp 18   Ht 6\' 2"  (1.88 m)   Wt 104.3 kg   SpO2 98%   BMI 29.53 kg/m   Physical Exam  Constitutional: He appears well-developed and well-nourished.  HENT:  Head: Atraumatic.  Neck: Normal range of motion.  Cardiovascular:  Pulses equal bilaterally  Musculoskeletal: He exhibits tenderness.  Generalized  ttp hands without appreciable edema, no erythema, skin intact without lesions or wounds. Radial pulses intact with less than 2 sec cap refill in digits.  Neurological: He is alert. He has normal strength. He displays normal reflexes. No sensory deficit.  Skin: Skin is warm and dry.  Psychiatric: He has a normal mood and affect.     ED Treatments / Results  Labs (all labs ordered are listed, but only abnormal results are displayed) Labs Reviewed - No data to display  EKG  EKG Interpretation None       Radiology No results found.  Procedures Procedures (including  critical care time)  Medications Ordered in ED Medications  dexamethasone (DECADRON) injection 10 mg (10 mg Intramuscular Given 11/15/16 1052)     Initial Impression / Assessment and Plan / ED Course  I have reviewed the triage vital signs and the nursing notes.  Pertinent labs & imaging results that were available during my care of the patient were reviewed by me and considered in my medical decision making (see chart for details).  Clinical Course     Discussed side effects/sequalae of frequent prednisone use. Pt understands, states he needs some relief until get in with rheumatology and hopefully on medication that can better control his sx.  He was given a short 6 days prednisone taper. Advised to start tomorrow as he received a decadron injection today.  Heat, avoid cold/ use gloves when outdoors, etc.  No findings to suggest joint infection or trauma.  Final Clinical Impressions(s) / ED Diagnoses   Final diagnoses:  Rheumatoid arthritis flare (HCC)    New Prescriptions Discharge Medication List as of 11/15/2016 10:38 AM    START taking these medications   Details  predniSONE (DELTASONE) 10 MG tablet 6, 5, 4, 3, 2 then 1 tablet by mouth daily for 6 days total., Print         Burgess Amor, PA-C 11/18/16 0901    Donnetta Hutching, MD 11/24/16 1136

## 2016-11-23 ENCOUNTER — Ambulatory Visit: Payer: Self-pay | Admitting: Physician Assistant

## 2016-12-02 ENCOUNTER — Encounter: Payer: Self-pay | Admitting: Physician Assistant

## 2016-12-20 ENCOUNTER — Emergency Department (HOSPITAL_COMMUNITY)
Admission: EM | Admit: 2016-12-20 | Discharge: 2016-12-20 | Disposition: A | Discharge status: Home / Self Care | Payer: Self-pay | Attending: Emergency Medicine | Admitting: Emergency Medicine

## 2016-12-20 ENCOUNTER — Encounter (HOSPITAL_COMMUNITY): Payer: Self-pay | Admitting: Emergency Medicine

## 2016-12-20 DIAGNOSIS — M069 Rheumatoid arthritis, unspecified: Secondary | ICD-10-CM | POA: Insufficient documentation

## 2016-12-20 DIAGNOSIS — Z79899 Other long term (current) drug therapy: Secondary | ICD-10-CM | POA: Insufficient documentation

## 2016-12-20 DIAGNOSIS — I10 Essential (primary) hypertension: Secondary | ICD-10-CM | POA: Insufficient documentation

## 2016-12-20 MED ORDER — DEXAMETHASONE SODIUM PHOSPHATE 4 MG/ML IJ SOLN
8.0000 mg | Freq: Once | INTRAMUSCULAR | Status: AC
  Administered 2016-12-20: 8 mg via INTRAMUSCULAR
  Filled 2016-12-20: qty 2

## 2016-12-20 MED ORDER — ONDANSETRON HCL 4 MG PO TABS
4.0000 mg | ORAL_TABLET | Freq: Once | ORAL | Status: AC
  Administered 2016-12-20: 4 mg via ORAL
  Filled 2016-12-20: qty 1

## 2016-12-20 MED ORDER — ACETAMINOPHEN 500 MG PO TABS
1000.0000 mg | ORAL_TABLET | Freq: Once | ORAL | Status: AC
  Administered 2016-12-20: 1000 mg via ORAL
  Filled 2016-12-20: qty 2

## 2016-12-20 MED ORDER — IBUPROFEN 800 MG PO TABS
800.0000 mg | ORAL_TABLET | Freq: Once | ORAL | Status: AC
  Administered 2016-12-20: 800 mg via ORAL
  Filled 2016-12-20: qty 1

## 2016-12-20 MED ORDER — PREDNISONE 20 MG PO TABS: 60.0000 mg | ORAL_TABLET | Freq: Every day | ORAL | 0 refills | Status: DC

## 2016-12-20 NOTE — ED Notes (Signed)
Patient presented paperwork from group home to be filled out by provider. Paper work given to [employer] and returned to patient. Copy made for patient's chart.

## 2016-12-20 NOTE — Discharge Instructions (Signed)
Please use 60 mg of prednisone daily with food. Use 600 mg of ibuprofen with breakfast, lunch, dinner, and at bedtime. May use Tylenol Extra Strength in between the ibuprofen doses if needed for assistance with your pain. Please see Mr. McElroy, or return to the emergency department if any emergent changes, problems, or concerns.

## 2016-12-20 NOTE — ED Provider Notes (Signed)
[facility] Provider Note   CSN: [id number] Arrival date & time: 12/20/16  0944     History   Chief Complaint Chief Complaint  Patient presents with  . Extremity Pain    HPI Curtis Cobb is a 45 y.o. male.  The history is provided by the patient.  Extremity Pain  This is a chronic problem. The problem occurs daily. The problem has been gradually worsening. Pertinent negatives include no chest pain, no abdominal pain and no shortness of breath. Exacerbated by: certain movement. Nothing relieves the symptoms. He has tried nothing for the symptoms.    Past Medical History:  Diagnosis Date  . Alcohol use disorder, moderate, dependence (HCC) 02/13/2014  . Anxiety   . Depression   . Hypertension   . Rheumatoid arthritis (HCC)   . Substance abuse     Patient Active Problem List   Diagnosis Date Noted  . 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 MDD (major depressive disorder), recurrent episode, severe (HCC) 11/17/2015  . Opioid use disorder, mild, in sustained remission 10/08/2015  . Alcohol use disorder, severe, dependence (HCC) 10/07/2015  . Cannabis use disorder, severe, dependence (HCC) 10/07/2015  . HTN (hypertension) 10/07/2015  . Rheumatoid arthritis (HCC) 10/07/2015    History reviewed. No pertinent surgical history.     Home Medications    Prior to Admission medications   Medication Sig Start Date End Date Taking? Authorizing Provider  predniSONE (DELTASONE) 10 MG tablet 6, 5, 4, 3, 2 then 1 tablet by mouth daily for 6 days total. 11/16/16   Burgess Amor, PA-C  QUEtiapine (SEROQUEL) 300 MG tablet Take 1 tablet (300 mg total) by mouth at bedtime. 07/13/16   Jimmy Footman, MD    Family History Family History  Problem Relation Age of Onset  . Heart attack Mother   . Stroke Mother   . Hypertension Mother   . Mental illness Mother     Manic  Depressive Bipolar Disorder  . Heart disease Mother   . Hypertension Maternal Aunt   . Mental illness Maternal Aunt   . Hypertension Maternal Aunt   . Mental illness Maternal Aunt     Social History Social History  Substance Use Topics  . Smoking status: Never Smoker  . Smokeless tobacco: Never Used  . Alcohol use No     Comment: alcoholic.  sober since 07-09-16     Allergies   Sulfa antibiotics   Review of Systems Review of Systems  Constitutional: Negative for activity change and fever.       All ROS Neg except as noted in HPI  HENT: Negative for nosebleeds.   Eyes: Negative for photophobia and discharge.  Respiratory: Negative for cough, shortness of breath and wheezing.   Cardiovascular: Negative for chest pain and palpitations.  Gastrointestinal: Negative for abdominal pain and blood in stool.  Genitourinary: Negative for dysuria, frequency and hematuria.  Musculoskeletal: Positive for arthralgias. Negative for back pain and neck pain.  Skin: Negative.   Neurological: Negative for dizziness, seizures and speech difficulty.  Psychiatric/Behavioral: Negative for confusion and hallucinations.     Physical Exam Updated Vital Signs BP 149/94 (BP Location: Right Arm)   Pulse 106   Temp 98.3 F (36.8 C) (Oral)   Resp 18   Ht 6\' 2"  (1.88 m)   Wt 104.3 kg   SpO2 98%   BMI 29.53 kg/m   Physical Exam  Constitutional: He is  oriented to person, place, and time. He appears well-developed and well-nourished.  Non-toxic appearance.  HENT:  Head: Normocephalic.  Right Ear: Tympanic membrane and external ear normal.  Left Ear: Tympanic membrane and external ear normal.  Eyes: EOM and lids are normal. Pupils are equal, round, and reactive to light.  Neck: Normal range of motion. Neck supple. Carotid bruit is not present.  Cardiovascular: Normal rate, regular rhythm, normal heart sounds, intact distal pulses and normal pulses.   Pulmonary/Chest: Breath sounds normal. No  respiratory distress.  Abdominal: Soft. Bowel sounds are normal. There is no tenderness. There is no guarding.  Musculoskeletal: Normal range of motion.  Pain with range of motion of bilat shoulders, wrist, knees, and ankles. No hot joints.  Lymphadenopathy:       Head (right side): No submandibular adenopathy present.       Head (left side): No submandibular adenopathy present.    He has no cervical adenopathy.  Neurological: He is alert and oriented to person, place, and time. He has normal strength. No cranial nerve deficit or sensory deficit.  Skin: Skin is warm and dry.  Psychiatric: He has a normal mood and affect. His speech is normal.  Nursing note and vitals reviewed.    ED Treatments / Results  Labs (all labs ordered are listed, but only abnormal results are displayed) Labs Reviewed - No data to display  EKG  EKG Interpretation None       Radiology No results found.  Procedures Procedures (including critical care time)  Medications Ordered in ED Medications - No data to display   Initial Impression / Assessment and Plan / ED Course  I have reviewed the triage vital signs and the nursing notes.  Pertinent labs & imaging results that were available during my care of the patient were reviewed by me and considered in my medical decision making (see chart for details).  Clinical Course     *I have reviewed nursing notes, vital signs, and all appropriate lab and imaging results for this patient.**  Final Clinical Impressions(s) / ED Diagnoses  Vital signs reviewed. Previous records reviewed. Patient has a history of rheumatoid arthritis. He is currently not on maintenance medications on. He states that today he is having a flareup of his rheumatoid arthritis and multiple joint sites on. The patient has had success with steroids in the past. Prescription for prednisone given to the patient. Patient advised to use ibuprofen and Tylenol for assistance also with his  pain. The patient will see his primary physician, or return to the emergency department if not improving.    Final diagnoses:  None    New Prescriptions New Prescriptions   No medications on file     Ivery Quale, PA-C 12/20/16 1048    Donnetta Hutching, MD 12/23/16 1229

## 2016-12-20 NOTE — ED Triage Notes (Signed)
Pt reports history of rheumatoid arthritis. Pt reports bilateral shoulder and leg pain for last several days. Pt denies any known injury. nad noted.

## 2017-01-17 ENCOUNTER — Encounter (HOSPITAL_COMMUNITY): Payer: Self-pay | Admitting: Cardiology

## 2017-01-17 ENCOUNTER — Emergency Department (HOSPITAL_COMMUNITY)
Admission: EM | Admit: 2017-01-17 | Discharge: 2017-01-17 | Disposition: A | Discharge status: Home / Self Care | Payer: Self-pay | Attending: Emergency Medicine | Admitting: Emergency Medicine

## 2017-01-17 DIAGNOSIS — M06042 Rheumatoid arthritis without rheumatoid factor, left hand: Secondary | ICD-10-CM | POA: Insufficient documentation

## 2017-01-17 DIAGNOSIS — I10 Essential (primary) hypertension: Secondary | ICD-10-CM | POA: Insufficient documentation

## 2017-01-17 DIAGNOSIS — M06041 Rheumatoid arthritis without rheumatoid factor, right hand: Secondary | ICD-10-CM | POA: Insufficient documentation

## 2017-01-17 DIAGNOSIS — M069 Rheumatoid arthritis, unspecified: Secondary | ICD-10-CM

## 2017-01-17 MED ORDER — PREDNISONE 20 MG PO TABS: 40.0000 mg | ORAL_TABLET | Freq: Every day | ORAL | 0 refills | Status: AC

## 2017-01-17 MED ORDER — DEXAMETHASONE SODIUM PHOSPHATE 4 MG/ML IJ SOLN
8.0000 mg | Freq: Once | INTRAMUSCULAR | Status: AC
  Administered 2017-01-17: 8 mg via INTRAMUSCULAR
  Filled 2017-01-17: qty 2

## 2017-01-17 MED ORDER — IBUPROFEN 400 MG PO TABS: 400.0000 mg | ORAL_TABLET | Freq: Three times a day (TID) | ORAL | 0 refills | Status: DC | PRN

## 2017-01-17 MED ORDER — KETOROLAC TROMETHAMINE 60 MG/2ML IM SOLN
60.0000 mg | Freq: Once | INTRAMUSCULAR | Status: AC
  Administered 2017-01-17: 60 mg via INTRAMUSCULAR
  Filled 2017-01-17: qty 2

## 2017-01-17 NOTE — ED Provider Notes (Signed)
[facility] Provider Note   CSN: [id number] Arrival date & time: 01/17/17  7414   By signing my name below, I, Bobbie Stack, attest that this documentation has been prepared under the direction and in the presence of Azalia Bilis, MD. Electronically Signed: Bobbie Stack, Scribe. 01/17/17. 10:10 AM. History   Chief Complaint Chief Complaint  Patient presents with  . Rheumatoid Arthritis     The history is provided by the patient. No language interpreter was used.   HPI Comments: Curtis Cobb is a 46 y.o. male who presents to the Emergency Department complaining of joint pain for the past 5 days. He states that the pain is worst in his left shoulder. He also has pain in his hands and knees. He has a hx of RA which he hasn't had a flare up in the past 6 months. He has an appointment with his rheumatologist on February 5th. He has been taking ibuprofen at home without any significant relief. Past Medical History:  Diagnosis Date  . Alcohol use disorder, moderate, dependence (HCC) 02/13/2014  . Anxiety   . Depression   . Hypertension   . Rheumatoid arthritis (HCC)   . Substance abuse     Patient Active Problem List   Diagnosis Date Noted  . 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 MDD (major depressive disorder), recurrent episode, severe (HCC) 11/17/2015  . Opioid use disorder, mild, in sustained remission 10/08/2015  . Alcohol use disorder, severe, dependence (HCC) 10/07/2015  . Cannabis use disorder, severe, dependence (HCC) 10/07/2015  . HTN (hypertension) 10/07/2015  . Rheumatoid arthritis (HCC) 10/07/2015    History reviewed. No pertinent surgical history.     Home Medications    Prior to Admission medications   Medication Sig Start Date End Date Taking? Authorizing Provider  ibuprofen (ADVIL,MOTRIN) 800 MG tablet Take 800 mg by mouth every 8 (eight) hours as  needed.    Historical Provider, MD  predniSONE (DELTASONE) 20 MG tablet Take 3 tablets (60 mg total) by mouth daily. Please take with food 12/20/16   Ivery Quale, PA-C  QUEtiapine (SEROQUEL) 300 MG tablet Take 1 tablet (300 mg total) by mouth at bedtime. 07/13/16   Jimmy Footman, MD    Family History Family History  Problem Relation Age of Onset  . Heart attack Mother   . Stroke Mother   . Hypertension Mother   . Mental illness Mother     Manic Depressive Bipolar Disorder  . Heart disease Mother   . Hypertension Maternal Aunt   . Mental illness Maternal Aunt   . Hypertension Maternal Aunt   . Mental illness Maternal Aunt     Social History Social History  Substance Use Topics  . Smoking status: Never Smoker  . Smokeless tobacco: Never Used  . Alcohol use No     Comment: alcoholic.  sober since 07-09-16     Allergies   Sulfa antibiotics   Review of Systems Review of Systems A complete 10 system review of systems was obtained and all systems are negative except as noted in the HPI and PMH.   Physical Exam Updated Vital Signs BP 118/76   Pulse (!) 122   Temp 97.7 F (36.5 C) (Oral)   Resp 18   Ht 6\' 2"  (1.88 m)   Wt 235 lb (106.6 kg)   SpO2 96%   BMI 30.17 kg/m   Physical Exam  Constitutional: He is oriented  to person, place, and time. He appears well-developed and well-nourished.  HENT:  Head: Normocephalic.  Eyes: EOM are normal.  Neck: Normal range of motion.  Pulmonary/Chest: Effort normal.  Abdominal: He exhibits no distension.  Musculoskeletal: Normal range of motion.  Hands: No obvious rheumatoid nodules noted.  Neurological: He is alert and oriented to person, place, and time.  Psychiatric: He has a normal mood and affect.  Nursing note and vitals reviewed.    ED Treatments / Results  DIAGNOSTIC STUDIES: Oxygen Saturation is 96% on RA, normal by my interpretation.    COORDINATION OF CARE: 10:11 AM Discussed treatment plan with  pt at bedside and pt agreed to plan.  Labs (all labs ordered are listed, but only abnormal results are displayed) Labs Reviewed - No data to display  EKG  EKG Interpretation None       Radiology No results found.  Procedures Procedures (including critical care time)  Medications Ordered in ED Medications - No data to display   Initial Impression / Assessment and Plan / ED Course  I have reviewed the triage vital signs and the nursing notes.  Pertinent labs & imaging results that were available during my care of the patient were reviewed by me and considered in my medical decision making (see chart for details).     Pain treated.  No evidence of life-threatening illness.  Schedule follow-up with rheumatology.  Final Clinical Impressions(s) / ED Diagnoses   Final diagnoses:  Rheumatoid arthritis involving both hands, unspecified rheumatoid factor presence (HCC)    New Prescriptions New Prescriptions   No medications on file   I personally performed the services described in this documentation, which was scribed in my presence. The recorded information has been reviewed and is accurate.       Azalia Bilis, MD 01/17/17 [phone]

## 2017-01-17 NOTE — ED Triage Notes (Signed)
Joint pain times 5 days.  Pt states he has RA and has an appoinment with his rheumatologist feb 5th.

## 2017-02-23 ENCOUNTER — Ambulatory Visit: Payer: Self-pay | Admitting: Physician Assistant

## 2017-02-23 ENCOUNTER — Emergency Department (HOSPITAL_COMMUNITY)
Admission: EM | Admit: 2017-02-23 | Discharge: 2017-02-23 | Disposition: A | Discharge status: Home / Self Care | Payer: Self-pay | Attending: Emergency Medicine | Admitting: Emergency Medicine

## 2017-02-23 ENCOUNTER — Encounter: Payer: Self-pay | Admitting: Physician Assistant

## 2017-02-23 ENCOUNTER — Encounter (HOSPITAL_COMMUNITY): Payer: Self-pay | Admitting: *Deleted

## 2017-02-23 VITALS — BP 116/80 | HR 109 | Temp 99.0°F | Ht 73.0 in | Wt 228.0 lb

## 2017-02-23 DIAGNOSIS — F1011 Alcohol abuse, in remission: Secondary | ICD-10-CM

## 2017-02-23 DIAGNOSIS — F39 Unspecified mood [affective] disorder: Secondary | ICD-10-CM

## 2017-02-23 DIAGNOSIS — M069 Rheumatoid arthritis, unspecified: Secondary | ICD-10-CM | POA: Insufficient documentation

## 2017-02-23 DIAGNOSIS — Z79899 Other long term (current) drug therapy: Secondary | ICD-10-CM | POA: Insufficient documentation

## 2017-02-23 DIAGNOSIS — M2550 Pain in unspecified joint: Secondary | ICD-10-CM

## 2017-02-23 DIAGNOSIS — I10 Essential (primary) hypertension: Secondary | ICD-10-CM | POA: Insufficient documentation

## 2017-02-23 DIAGNOSIS — F1911 Other psychoactive substance abuse, in remission: Secondary | ICD-10-CM

## 2017-02-23 MED ORDER — DICLOFENAC SODIUM 75 MG PO TBEC: 75.0000 mg | DELAYED_RELEASE_TABLET | Freq: Two times a day (BID) | ORAL | 0 refills | Status: DC | PRN

## 2017-02-23 MED ORDER — DEXAMETHASONE SODIUM PHOSPHATE 4 MG/ML IJ SOLN
8.0000 mg | Freq: Once | INTRAMUSCULAR | Status: AC
Start: 2017-02-23 — End: 2017-02-23
  Administered 2017-02-23: 8 mg via INTRAMUSCULAR
  Filled 2017-02-23: qty 2

## 2017-02-23 MED ORDER — PREDNISONE 20 MG PO TABS: 60.0000 mg | ORAL_TABLET | Freq: Every day | ORAL | 0 refills | Status: DC

## 2017-02-23 NOTE — Discharge Instructions (Signed)
Please see the rheumatology specialist as suggested. See Mr. McElroy for additional evaluation if any changes or problems before being seen by the rheumatology specialist.

## 2017-02-23 NOTE — Progress Notes (Signed)
BP 116/80 (BP Location: Left Arm, Patient Position: Sitting, Cuff Size: Normal)   Pulse (!) 109   Temp 99 F (37.2 C)   Ht 6\' 1"  (1.854 m)   Wt 228 lb (103.4 kg)   SpO2 97%   BMI 30.08 kg/m    Subjective:    Patient ID: , male    DOB: [birth year], 46 y.o.   MRN: [id number]  HPI: Curtis Cobb is a 46 y.o. male presenting on 02/23/2017 for Follow-up   HPI   Pt is still at [facility] house.  He is expecting to be there until July  Pt still going to [facility] for MH issues.   Pt states he is having a lot of anxiety.    Pt went to Dr [facility].  He says that they discussed starting methotrexate and pt was not sure at the time but then later called back saying that he was interested.  Dr Kellie Simmering then had made the decision to retire so didn't want to get the pt started on the MTX.   Relevant past medical, surgical, family and social history reviewed and updated as indicated. Interim medical history since our last visit reviewed. Allergies and medications reviewed and updated.   Current Outpatient Prescriptions:  .  ibuprofen (ADVIL,MOTRIN) 400 MG tablet, Take 1 tablet (400 mg total) by mouth every 8 (eight) hours as needed., Disp: 15 tablet, Rfl: 0 .  QUEtiapine (SEROQUEL) 300 MG tablet, Take 1 tablet (300 mg total) by mouth at bedtime., Disp: 30 tablet, Rfl: 0   Review of Systems  Constitutional: Positive for appetite change and fatigue. Negative for chills, diaphoresis, fever and unexpected weight change.  HENT: Negative for congestion, drooling, ear pain, facial swelling, hearing loss, mouth sores, sneezing, sore throat, trouble swallowing and voice change.   Eyes: Negative for pain, discharge, redness, itching and visual disturbance.  Respiratory: Negative for cough, choking, shortness of breath and wheezing.   Cardiovascular: Positive for leg swelling. Negative for chest pain and palpitations.  Gastrointestinal: Negative for abdominal pain, blood in stool, constipation,  diarrhea and vomiting.  Endocrine: Negative for cold intolerance, heat intolerance and polydipsia.  Genitourinary: Negative for decreased urine volume, dysuria and hematuria.  Musculoskeletal: Positive for arthralgias and back pain. Negative for gait problem.  Skin: Negative for rash.  Allergic/Immunologic: Negative for environmental allergies.  Neurological: Negative for seizures, syncope, light-headedness and headaches.  Hematological: Negative for adenopathy.  Psychiatric/Behavioral: Positive for dysphoric mood. Negative for agitation and suicidal ideas. The patient is nervous/anxious.     Per HPI unless specifically indicated above     Objective:    BP 116/80 (BP Location: Left Arm, Patient Position: Sitting, Cuff Size: Normal)   Pulse (!) 109   Temp 99 F (37.2 C)   Ht 6\' 1"  (1.854 m)   Wt 228 lb (103.4 kg)   SpO2 97%   BMI 30.08 kg/m   Wt Readings from Last 3 Encounters:  02/23/17 228 lb (103.4 kg)  01/17/17 235 lb (106.6 kg)  12/20/16 230 lb (104.3 kg)    Physical Exam  Constitutional: He is oriented to person, place, and time. He appears well-developed and well-nourished.  HENT:  Head: Normocephalic and atraumatic.  Neck: Neck supple.  Cardiovascular: Normal rate and regular rhythm.   Pulmonary/Chest: Effort normal and breath sounds normal. He has no wheezes.  Abdominal: Soft. Bowel sounds are normal. There is no hepatosplenomegaly. There is no tenderness.  Musculoskeletal: He exhibits no edema.  No joint swelling seen  Lymphadenopathy:    He has no cervical adenopathy.  Neurological: He is alert and oriented to person, place, and time.  Skin: Skin is warm and dry.  Dry irritated skin around eyes  Psychiatric: He has a normal mood and affect. His behavior is normal.  Vitals reviewed.        Assessment & Plan:     Encounter Diagnoses  Name Primary?  Marland Kitchen Arthralgia of multiple joints Yes  . Mood disorder (HCC)   . Substance abuse in remission   .  Alcohol abuse, in remission      -Pt asks about celebrex.   Due to cost will rx diclofenac.   -Refer to rheumatologist at [facility] -f/u 6 wk to make sure pt in with specialist

## 2017-02-23 NOTE — ED Provider Notes (Signed)
[facility] DEPT Provider Note   CSN: [id number] Arrival date & time: 02/23/17  1003     History   Chief Complaint Chief Complaint  Patient presents with  . Arthritis    HPI Curtis Cobb is a 46 y.o. male.  Patient is a 46 year old male who presents to the emergency department with multiple joint area pain.  The patient states he has a history of rheumatoid arthritis. He is in a halfway house because of substance abuse. He was seen by his primary physician and placed on diclofenac for pain. He is scheduled to see a rheumatologist. The patient states however he is having increasing amount of pain in his joints, and he states that he has had good result from intramuscular steroid and steroid taper. He is requesting intramuscular medication and steroid taper to assist with his pain. He has not had any hot joints. No high temperature elevations. No recent injuries to joints. No recent procedures to any of the joints.      Past Medical History:  Diagnosis Date  . Alcohol use disorder, moderate, dependence (HCC) 02/13/2014  . Anxiety   . Depression   . Hypertension   . Rheumatoid arthritis (HCC)   . Substance abuse     Patient Active Problem List   Diagnosis Date Noted  . 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 MDD (major depressive disorder), recurrent episode, severe (HCC) 11/17/2015  . Opioid use disorder, mild, in sustained remission 10/08/2015  . Alcohol use disorder, severe, dependence (HCC) 10/07/2015  . Cannabis use disorder, severe, dependence (HCC) 10/07/2015  . HTN (hypertension) 10/07/2015  . Rheumatoid arthritis (HCC) 10/07/2015    History reviewed. No pertinent surgical history.     Home Medications    Prior to Admission medications   Medication Sig Start Date End Date Taking? Authorizing Provider  ibuprofen (ADVIL,MOTRIN) 400 MG tablet Take 1 tablet (400 mg  total) by mouth every 8 (eight) hours as needed. 01/17/17  Yes Azalia Bilis, MD  QUEtiapine (SEROQUEL) 300 MG tablet Take 1 tablet (300 mg total) by mouth at bedtime. 07/13/16  Yes Jimmy Footman, MD  diclofenac (VOLTAREN) 75 MG EC tablet Take 1 tablet (75 mg total) by mouth 2 (two) times daily as needed. 02/23/17   Jacquelin Hawking, PA-C    Family History Family History  Problem Relation Age of Onset  . Heart attack Mother   . Stroke Mother   . Hypertension Mother   . Mental illness Mother     Manic Depressive Bipolar Disorder  . Heart disease Mother   . Hypertension Maternal Aunt   . Mental illness Maternal Aunt   . Hypertension Maternal Aunt   . Mental illness Maternal Aunt     Social History Social History  Substance Use Topics  . Smoking status: Never Smoker  . Smokeless tobacco: Never Used  . Alcohol use No     Comment: alcoholic.  sober since 07-09-16     Allergies   Sulfa antibiotics   Review of Systems Review of Systems  Musculoskeletal: Positive for arthralgias.  All other systems reviewed and are negative.    Physical Exam Updated Vital Signs BP 123/90 (BP Location: Left Arm)   Pulse 88   Temp 97.6 F (36.4 C) (Oral)   Resp 21   Ht 6\' 2"  (1.88 m)   Wt 104.3 kg   SpO2 99%   BMI 29.53 kg/m   Physical Exam  Constitutional: He is oriented to person, place, and time. He appears well-developed and well-nourished.  Non-toxic appearance.  HENT:  Head: Normocephalic.  Right Ear: Tympanic membrane and external ear normal.  Left Ear: Tympanic membrane and external ear normal.  Eyes: EOM and lids are normal. Pupils are equal, round, and reactive to light.  Neck: Normal range of motion. Neck supple. Carotid bruit is not present.  Cardiovascular: Normal rate, regular rhythm, normal heart sounds, intact distal pulses and normal pulses.   Pulmonary/Chest: Breath sounds normal. No respiratory distress.  Abdominal: Soft. Bowel sounds are normal. There is  no tenderness. There is no guarding.  Musculoskeletal: Normal range of motion.  There are degenerative changes of multiple joints. There no hot joints appreciated. There is good range of motion of the shoulders, elbows, wrists, fingers, knees, ankles. Radial pulses are 2+. Dorsalis pedis pulses are 2+.  Lymphadenopathy:       Head (right side): No submandibular adenopathy present.       Head (left side): No submandibular adenopathy present.    He has no cervical adenopathy.  Neurological: He is alert and oriented to person, place, and time. He has normal strength. No cranial nerve deficit or sensory deficit.  Skin: Skin is warm and dry.  Psychiatric: He has a normal mood and affect. His speech is normal.  Nursing note and vitals reviewed.    ED Treatments / Results  Labs (all labs ordered are listed, but only abnormal results are displayed) Labs Reviewed - No data to display  EKG  EKG Interpretation None       Radiology No results found.  Procedures Procedures (including critical care time)  Medications Ordered in ED Medications - No data to display   Initial Impression / Assessment and Plan / ED Course  I have reviewed the triage vital signs and the nursing notes.  Pertinent labs & imaging results that were available during my care of the patient were reviewed by me and considered in my medical decision making (see chart for details).     **I have reviewed nursing notes, vital signs, and all appropriate lab and imaging results for this patient.*  Final Clinical Impressions(s) / ED Diagnoses  Vital signs reviewed. Patient has a history of rheumatoid arthritis. He is having a flareup of multiple joints. He is scheduled to see a rheumatologist soon. The patient is requesting intramuscular steroids. Intramuscular Decadron ordered. Oral prednisone also ordered. The patient is to follow-up with Mr. Katherene Ponto until seen by the rheumatology specialist.    Final diagnoses:    None    New Prescriptions New Prescriptions   No medications on file     Ivery Quale, PA-C 02/23/17 1307    Samuel Jester, DO 02/28/17 1811

## 2017-02-23 NOTE — ED Notes (Signed)
Pt made aware to return if symptoms worsen or if any life threatening symptoms occur.   

## 2017-02-23 NOTE — ED Triage Notes (Signed)
Pt has hx of RA and has been referred to a rheumatologist. Pt has been having increased amount of pain in his joints (knees, feet, arms, hands). Pt ambulatory

## 2017-03-30 ENCOUNTER — Encounter: Payer: Self-pay | Admitting: Physician Assistant

## 2017-04-13 ENCOUNTER — Ambulatory Visit: Payer: Self-pay | Admitting: Physician Assistant

## 2017-04-15 ENCOUNTER — Encounter: Payer: Self-pay | Admitting: Physician Assistant

## 2018-08-23 ENCOUNTER — Emergency Department: Payer: Self-pay

## 2018-08-23 ENCOUNTER — Other Ambulatory Visit: Payer: Self-pay

## 2018-08-23 ENCOUNTER — Inpatient Hospital Stay
Admission: EM | Admit: 2018-08-23 | Discharge: 2018-09-01 | DRG: 603 | Disposition: A | Discharge status: Home / Self Care | Payer: Self-pay | Attending: Internal Medicine | Admitting: Internal Medicine

## 2018-08-23 ENCOUNTER — Encounter: Payer: Self-pay | Admitting: Emergency Medicine

## 2018-08-23 DIAGNOSIS — F329 Major depressive disorder, single episode, unspecified: Secondary | ICD-10-CM | POA: Diagnosis present

## 2018-08-23 DIAGNOSIS — F419 Anxiety disorder, unspecified: Secondary | ICD-10-CM | POA: Diagnosis present

## 2018-08-23 DIAGNOSIS — L0390 Cellulitis, unspecified: Secondary | ICD-10-CM | POA: Diagnosis present

## 2018-08-23 DIAGNOSIS — Z79899 Other long term (current) drug therapy: Secondary | ICD-10-CM

## 2018-08-23 DIAGNOSIS — W57XXXA Bitten or stung by nonvenomous insect and other nonvenomous arthropods, initial encounter: Secondary | ICD-10-CM | POA: Diagnosis present

## 2018-08-23 DIAGNOSIS — E876 Hypokalemia: Secondary | ICD-10-CM | POA: Diagnosis present

## 2018-08-23 DIAGNOSIS — I10 Essential (primary) hypertension: Secondary | ICD-10-CM | POA: Diagnosis present

## 2018-08-23 DIAGNOSIS — Z882 Allergy status to sulfonamides status: Secondary | ICD-10-CM

## 2018-08-23 DIAGNOSIS — M069 Rheumatoid arthritis, unspecified: Secondary | ICD-10-CM | POA: Diagnosis present

## 2018-08-23 DIAGNOSIS — Z8249 Family history of ischemic heart disease and other diseases of the circulatory system: Secondary | ICD-10-CM

## 2018-08-23 DIAGNOSIS — Z818 Family history of other mental and behavioral disorders: Secondary | ICD-10-CM

## 2018-08-23 DIAGNOSIS — L03113 Cellulitis of right upper limb: Principal | ICD-10-CM | POA: Diagnosis present

## 2018-08-23 DIAGNOSIS — L02511 Cutaneous abscess of right hand: Secondary | ICD-10-CM | POA: Diagnosis present

## 2018-08-23 LAB — URINALYSIS, COMPLETE (UACMP) WITH MICROSCOPIC
Bacteria, UA: NONE SEEN
Bilirubin Urine: NEGATIVE
GLUCOSE, UA: NEGATIVE mg/dL
Ketones, ur: NEGATIVE mg/dL
Leukocytes, UA: NEGATIVE
NITRITE: NEGATIVE
PH: 6 (ref 5.0–8.0)
PROTEIN: NEGATIVE mg/dL
Specific Gravity, Urine: 1.018 (ref 1.005–1.030)

## 2018-08-23 LAB — COMPREHENSIVE METABOLIC PANEL
ALK PHOS: 50 U/L (ref 38–126)
ALT: 10 U/L (ref 0–44)
AST: 16 U/L (ref 15–41)
Albumin: 3.8 g/dL (ref 3.5–5.0)
Anion gap: 11 (ref 5–15)
BUN: 6 mg/dL (ref 6–20)
CALCIUM: 8.7 mg/dL — AB (ref 8.9–10.3)
CO2: 26 mmol/L (ref 22–32)
Chloride: 103 mmol/L (ref 98–111)
Creatinine, Ser: 0.97 mg/dL (ref 0.61–1.24)
Glucose, Bld: 105 mg/dL — ABNORMAL HIGH (ref 70–99)
Potassium: 3.6 mmol/L (ref 3.5–5.1)
SODIUM: 140 mmol/L (ref 135–145)
Total Bilirubin: 0.8 mg/dL (ref 0.3–1.2)
Total Protein: 7.7 g/dL (ref 6.5–8.1)

## 2018-08-23 LAB — URINE DRUG SCREEN, QUALITATIVE (ARMC ONLY)
AMPHETAMINES, UR SCREEN: NOT DETECTED
Barbiturates, Ur Screen: NOT DETECTED
COCAINE METABOLITE, UR ~~LOC~~: NOT DETECTED
Cannabinoid 50 Ng, Ur ~~LOC~~: NOT DETECTED
MDMA (ECSTASY) UR SCREEN: NOT DETECTED
Methadone Scn, Ur: NOT DETECTED
Opiate, Ur Screen: NOT DETECTED
PHENCYCLIDINE (PCP) UR S: NOT DETECTED
Tricyclic, Ur Screen: NOT DETECTED

## 2018-08-23 LAB — CBC WITH DIFFERENTIAL/PLATELET
BASOS ABS: 0.1 10*3/uL (ref 0–0.1)
Basophils Relative: 0 %
Eosinophils Absolute: 0.1 10*3/uL (ref 0–0.7)
Eosinophils Relative: 0 %
HCT: 36 % — ABNORMAL LOW (ref 40.0–52.0)
Hemoglobin: 12.9 g/dL — ABNORMAL LOW (ref 13.0–18.0)
LYMPHS ABS: 1.3 10*3/uL (ref 1.0–3.6)
LYMPHS PCT: 6 %
MCH: 31.3 pg (ref 26.0–34.0)
MCHC: 35.9 g/dL (ref 32.0–36.0)
MCV: 87.2 fL (ref 80.0–100.0)
Monocytes Absolute: 1.4 10*3/uL — ABNORMAL HIGH (ref 0.2–1.0)
Monocytes Relative: 6 %
NEUTROS PCT: 88 %
Neutro Abs: 18.8 10*3/uL — ABNORMAL HIGH (ref 1.4–6.5)
Platelets: 415 10*3/uL (ref 150–440)
RBC: 4.13 MIL/uL — AB (ref 4.40–5.90)
RDW: 13.5 % (ref 11.5–14.5)
WBC: 21.6 10*3/uL — AB (ref 3.8–10.6)

## 2018-08-23 LAB — BRAIN NATRIURETIC PEPTIDE: B NATRIURETIC PEPTIDE 5: 98 pg/mL (ref 0.0–100.0)

## 2018-08-23 LAB — LACTIC ACID, PLASMA
LACTIC ACID, VENOUS: 1.6 mmol/L (ref 0.5–1.9)
Lactic Acid, Venous: 1.4 mmol/L (ref 0.5–1.9)

## 2018-08-23 MED ORDER — CEFAZOLIN SODIUM-DEXTROSE 1-4 GM/50ML-% IV SOLN
1.0000 g | Freq: Once | INTRAVENOUS | Status: AC
  Administered 2018-08-23: 1 g via INTRAVENOUS
  Filled 2018-08-23: qty 50

## 2018-08-23 MED ORDER — QUETIAPINE FUMARATE 300 MG PO TABS
300.0000 mg | ORAL_TABLET | Freq: Every day | ORAL | Status: DC
  Administered 2018-08-23 – 2018-08-31 (×9): 300 mg via ORAL
  Filled 2018-08-23 (×10): qty 1

## 2018-08-23 MED ORDER — ENOXAPARIN SODIUM 40 MG/0.4ML ~~LOC~~ SOLN
40.0000 mg | SUBCUTANEOUS | Status: DC
  Administered 2018-08-23 – 2018-08-31 (×6): 40 mg via SUBCUTANEOUS
  Filled 2018-08-23 (×8): qty 0.4

## 2018-08-23 MED ORDER — IBUPROFEN 400 MG PO TABS
400.0000 mg | ORAL_TABLET | Freq: Four times a day (QID) | ORAL | Status: DC | PRN
  Administered 2018-08-23: 400 mg via ORAL
  Filled 2018-08-23: qty 1

## 2018-08-23 MED ORDER — TRAMADOL HCL 50 MG PO TABS
50.0000 mg | ORAL_TABLET | Freq: Once | ORAL | Status: AC
  Administered 2018-08-23: 50 mg via ORAL
  Filled 2018-08-23: qty 1

## 2018-08-23 MED ORDER — SODIUM CHLORIDE 0.9 % IV SOLN
3.0000 g | Freq: Four times a day (QID) | INTRAVENOUS | Status: DC
  Administered 2018-08-23 – 2018-08-26 (×9): 3 g via INTRAVENOUS
  Filled 2018-08-23 (×14): qty 3

## 2018-08-23 MED ORDER — KETOROLAC TROMETHAMINE 30 MG/ML IJ SOLN
30.0000 mg | Freq: Once | INTRAMUSCULAR | Status: AC
  Administered 2018-08-23: 30 mg via INTRAVENOUS
  Filled 2018-08-23: qty 1

## 2018-08-23 MED ORDER — CLINDAMYCIN PHOSPHATE 600 MG/50ML IV SOLN: 600.0000 mg | Freq: Once | INTRAVENOUS | Status: DC

## 2018-08-23 MED ORDER — TRAMADOL HCL 50 MG PO TABS
50.0000 mg | ORAL_TABLET | Freq: Four times a day (QID) | ORAL | Status: DC | PRN
  Administered 2018-08-23 – 2018-08-29 (×6): 50 mg via ORAL
  Filled 2018-08-23 (×6): qty 1

## 2018-08-23 MED ORDER — VANCOMYCIN HCL 10 G IV SOLR
1500.0000 mg | Freq: Two times a day (BID) | INTRAVENOUS | Status: DC
  Administered 2018-08-24 – 2018-08-25 (×5): 1500 mg via INTRAVENOUS
  Filled 2018-08-23 (×6): qty 1500

## 2018-08-23 MED ORDER — VANCOMYCIN HCL 10 G IV SOLR
1500.0000 mg | Freq: Once | INTRAVENOUS | Status: AC
  Administered 2018-08-23: 1500 mg via INTRAVENOUS
  Filled 2018-08-23 (×2): qty 1500

## 2018-08-23 NOTE — ED Provider Notes (Signed)
[facility] Emergency Department Provider Note ____________________________________________  Time seen: 1245  I have reviewed the triage vital signs and the nursing notes.  HISTORY  Chief Complaint  Arm Swelling  HPI Curtis Cobb is a 47 y.o. male presents to the ED accompanied by his significant other, for evaluation of sudden right hand pain and swelling.  Patient describes onset about 2 days prior to arrival, but notes that the swelling increased drastically upon awakening today.  He initially noted small bumps to the dorsal hand primarily over the ring finger which she thought was poison ivy.  He denies however, in the exposure in the yard, guarding, or through pets.  He denies any known insect bites, abrasions, open wounds, or self-inflicted injuries.  Patient notes swelling from the hand to the distal forearm.  He also notes pain and tightness with attempted fist.  He denies any interim fevers but does note some chills.  He denies any chest pain, shortness of breath, nausea, vomiting, or dizziness.  Patient gives a medical history consistent with rheumatoid arthritis as well as anxiety and depression.  He takes Seroquel daily as directed.  Past Medical History:  Diagnosis Date  . Alcohol use disorder, moderate, dependence (HCC) 02/13/2014  . Anxiety   . Depression   . Hypertension   . Rheumatoid arthritis (HCC)   . Substance abuse [facility])     Patient Active Problem List   Diagnosis Date Noted  . 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 MDD (major depressive disorder), recurrent episode, severe (HCC) 11/17/2015  . Opioid use disorder, mild, in sustained remission (HCC) 10/08/2015  . Alcohol use disorder, severe, dependence (HCC) 10/07/2015  . Cannabis use disorder, severe, dependence (HCC) 10/07/2015  . HTN (hypertension) 10/07/2015  . Rheumatoid arthritis (HCC)  10/07/2015    History reviewed. No pertinent surgical history.  Prior to Admission medications   Medication Sig Start Date End Date Taking? Authorizing Provider  QUEtiapine (SEROQUEL) 300 MG tablet Take 1 tablet (300 mg total) by mouth at bedtime. 07/13/16  Yes Jimmy Footman, MD  diclofenac (VOLTAREN) 75 MG EC tablet Take 1 tablet (75 mg total) by mouth 2 (two) times daily as needed. 02/23/17   Jacquelin Hawking, PA-C  ibuprofen (ADVIL,MOTRIN) 400 MG tablet Take 1 tablet (400 mg total) by mouth every 8 (eight) hours as needed. 01/17/17   Azalia Bilis, MD  predniSONE (DELTASONE) 20 MG tablet Take 3 tablets (60 mg total) by mouth daily. Please take with food 02/23/17   Ivery Quale, PA-C   Allergies Sulfa antibiotics  Family History  Problem Relation Age of Onset  . Heart attack Mother   . Stroke Mother   . Hypertension Mother   . Mental illness Mother        Manic Depressive Bipolar Disorder  . Heart disease Mother   . Hypertension Maternal Aunt   . Mental illness Maternal Aunt   . Hypertension Maternal Aunt   . Mental illness Maternal Aunt     Social History Social History   Tobacco Use  . Smoking status: Never Smoker  . Smokeless tobacco: Never Used  Substance Use Topics  . Alcohol use: No    Comment: alcoholic.  sober since 07-09-16  . Drug use: No    Types: Amphetamines, Marijuana, Cocaine, Heroin    Comment:  Last use was christmas 2016    Review of Systems  Constitutional: Negative for fever.  Reports  intermittent chills. Eyes: Negative for visual changes. ENT: Negative for sore throat. Cardiovascular: Negative for chest pain. Respiratory: Negative for shortness of breath. Gastrointestinal: Negative for abdominal pain, vomiting and diarrhea. Genitourinary: Negative for dysuria. Musculoskeletal: Negative for back pain. Skin: Negative for rash.  Right hand swelling and edema as above. Neurological: Negative for headaches, focal weakness or  numbness. ____________________________________________  PHYSICAL EXAM:  VITAL SIGNS: ED Triage Vitals  Enc Vitals Group     BP 08/23/18 1109 126/75     Pulse Rate 08/23/18 1109 87     Resp 08/23/18 1109 18     Temp 08/23/18 1109 98.2 F (36.8 C)     Temp Source 08/23/18 1109 Oral     SpO2 08/23/18 1109 100 %     Weight 08/23/18 1110 190 lb (86.2 kg)     Height 08/23/18 1110 6\' 2"  (1.88 m)     Head Circumference --      Peak Flow --      Pain Score 08/23/18 1110 8     Pain Loc --      Pain Edu? --      Excl. in GC? --     Constitutional: Alert and oriented. Well appearing and in no distress. Head: Normocephalic and atraumatic. Eyes: Conjunctivae are normal. PERRL. Normal extraocular movements Hematological/Lymphatic/Immunological: Palpable right epitrochlear & axillary lymphadenopathy. Cardiovascular: Normal rate, regular rhythm. Normal distal pulses and cap refill. Respiratory: Normal respiratory effort. No wheezes/rales/rhonchi. Gastrointestinal: Soft and nontender. No distention. Normal bowel sounds Musculoskeletal: Decreased composite fist due to STS. No passive ROM tenderness to the flexor tendons. Nontender with normal range of motion in all extremities.  Neurologic:  Normal gait without ataxia. Normal speech and language. No gross focal neurologic deficits are appreciated. Skin:  Skin is warm, dry and intact. No rash noted. Right hand with soft tissue swelling and edema noted dorsally. Extension over the wrist noted. Local papules to the dorsal ring finger at the proximal phalanx. No weeping, induration, purulence, or pointing noted. A single scabbed papule is noted to the proximal phalanx.  Psychiatric: Mood and affect are normal. Patient exhibits appropriate insight and judgment. ____________________________________________   LABS (pertinent positives/negatives)  Labs Reviewed  CBC WITH DIFFERENTIAL/PLATELET - Abnormal; Notable for the following components:       Result Value   WBC 21.6 (*)    RBC 4.13 (*)    Hemoglobin 12.9 (*)    HCT 36.0 (*)    Neutro Abs 18.8 (*)    Monocytes Absolute 1.4 (*)    All other components within normal limits  COMPREHENSIVE METABOLIC PANEL - Abnormal; Notable for the following components:   Glucose, Bld 105 (*)    Calcium 8.7 (*)    All other components within normal limits  CULTURE, BLOOD (ROUTINE X 2)  CULTURE, BLOOD (ROUTINE X 2)  BRAIN NATRIURETIC PEPTIDE  LACTIC ACID, PLASMA  LACTIC ACID, PLASMA  URINALYSIS, COMPLETE (UACMP) WITH MICROSCOPIC  URINE DRUG SCREEN, QUALITATIVE ([facility] ONLY)  ____________________________________________   RADIOLOGY  Right Hand  IMPRESSION: 1. Extensive soft tissue swelling without acute or focal associated osseous abnormality. Question cellulitis or trauma. 2. Degenerative changes at the radiocarpal joint. ____________________________________________  PROCEDURES  Procedures   Toradol 30 mg IVP Ultram 50 mg PO ____________________________________________  INITIAL IMPRESSION / ASSESSMENT AND PLAN / ED COURSE  Patient with an elevated white count with an acutely inflamed right hand.  X-rays negative for any acute fracture or dislocation.  The exam clinical picture is concerning for  cellulitis.  Patient was treated empirically with IV antibiotics.  I will discuss the case with hospitalist for admission for continued IV management.  ----------------------------------------- 2:03 PM on 08/23/2018 ----------------------------------------- Spoke with Dr. Joice Lofts regarding case. He will consult and see the patient in the ED.   ----------------------------------------- 6:11 PM on 08/23/2018 ----------------------------------------- Poggi agrees with plan for admission for IV antibiotics. No indication for surgical intervention.  ____________________________________________  FINAL CLINICAL IMPRESSION(S) / ED DIAGNOSES  Final diagnoses:  Cellulitis of right upper  extremity      Karmen Stabs, Charlesetta Ivory, PA-C 08/23/18 1823    Jene Every, MD 08/30/18 [phone]

## 2018-08-23 NOTE — ED Triage Notes (Signed)
Pt arrived with concerns with red arm swelling and redness. Pt states the swelling and redness started two days ago but today drastically increased. Swelling increases midway up arm. Radial pulses present and strong all sensation intact.

## 2018-08-23 NOTE — ED Notes (Signed)
Dr Joice Lofts in with patient

## 2018-08-23 NOTE — ED Notes (Signed)
Resting at present   Awaiting the admitting md  Denies any new complaints

## 2018-08-23 NOTE — H&P (Signed)
[facility] [place] [facility]   PATIENT NAME: Curtis Cobb    MR#:  [id number]  DATE OF BIRTH:  1971-06-27  DATE OF ADMISSION:  08/23/2018  PRIMARY CARE PHYSICIAN: Patient, No Pcp Per   REQUESTING/REFERRING PHYSICIAN: Nida Boatman, MD  CHIEF COMPLAINT:   Chief Complaint  Patient presents with  . Arm Swelling    HISTORY OF PRESENT ILLNESS: Curtis Cobb  is a 47 y.o. male with a known history of alcohol abuse in the past, anxiety, depression, hypertension and rheumatoid arthritis who was taking prednisone up to 2 weeks ago and he ran out of it who is presenting to the emergency room with complaint of right hand swelling extending to his wrist.  Patient was seen in consultation by orthopedics who recommended IV antibiotics.  Patient complains of fever chills.  Also complains of some nausea no chest pain or shortness of breath  PAST MEDICAL HISTORY:   Past Medical History:  Diagnosis Date  . Alcohol use disorder, moderate, dependence (HCC) 02/13/2014  . Anxiety   . Depression   . Hypertension   . Rheumatoid arthritis (HCC)   . Substance abuse (HCC)     PAST SURGICAL HISTORY:  Past Surgical History:  Procedure Laterality Date  . none      SOCIAL HISTORY:  Social History   Tobacco Use  . Smoking status: Never Smoker  . Smokeless tobacco: Never Used  Substance Use Topics  . Alcohol use: No    Comment: alcoholic.  sober since 07-09-16    FAMILY HISTORY:  Family History  Problem Relation Age of Onset  . Heart attack Mother   . Stroke Mother   . Hypertension Mother   . Mental illness Mother        Manic Depressive Bipolar Disorder  . Heart disease Mother   . Hypertension Maternal Aunt   . Mental illness Maternal Aunt   . Hypertension Maternal Aunt   . Mental illness Maternal Aunt     DRUG ALLERGIES:  Allergies  Allergen Reactions  . Sulfa Antibiotics Other (See Comments)    Unknown reaction    REVIEW OF SYSTEMS:   CONSTITUTIONAL: No fever,  fatigue or weakness.  EYES: No blurred or double vision.  EARS, NOSE, AND THROAT: No tinnitus or ear pain.  RESPIRATORY: No cough, shortness of breath, wheezing or hemoptysis.  CARDIOVASCULAR: No chest pain, orthopnea, edema.  GASTROINTESTINAL: No nausea, vomiting, diarrhea or abdominal pain.  GENITOURINARY: No dysuria, hematuria.  ENDOCRINE: No polyuria, nocturia,  HEMATOLOGY: No anemia, easy bruising or bleeding SKIN: Right hand swelling redness. MUSCULOSKELETAL: No joint pain or arthritis.   NEUROLOGIC: No tingling, numbness, weakness.  PSYCHIATRY: No anxiety or depression.   MEDICATIONS AT HOME:  Prior to Admission medications   Medication Sig Start Date End Date Taking? Authorizing Provider  QUEtiapine (SEROQUEL) 300 MG tablet Take 1 tablet (300 mg total) by mouth at bedtime. 07/13/16  Yes Jimmy Footman, MD  diclofenac (VOLTAREN) 75 MG EC tablet Take 1 tablet (75 mg total) by mouth 2 (two) times daily as needed. Patient not taking: Reported on 08/23/2018 02/23/17   Jacquelin Hawking, PA-C  ibuprofen (ADVIL,MOTRIN) 400 MG tablet Take 1 tablet (400 mg total) by mouth every 8 (eight) hours as needed. Patient not taking: Reported on 08/23/2018 01/17/17   Azalia Bilis, MD  LORazepam (ATIVAN) 2 MG tablet Take 2 mg by mouth at bedtime as needed. for sleep 07/08/18   [provider]  predniSONE (DELTASONE) 20 MG tablet Take  3 tablets (60 mg total) by mouth daily. Please take with food Patient not taking: Reported on 08/23/2018 02/23/17   Ivery Quale, PA-C      PHYSICAL EXAMINATION:   VITAL SIGNS: Blood pressure 119/63, pulse 82, temperature 97.9 F (36.6 C), temperature source Oral, resp. rate 14, height 6\' 2"  (1.88 m), weight 86.2 kg, SpO2 98 %.  GENERAL:  47 y.o.-year-old patient lying in the bed with no acute distress.  EYES: Pupils equal, round, reactive to light and accommodation. No scleral icterus. Extraocular muscles intact.  HEENT: Head atraumatic,  normocephalic. Oropharynx and nasopharynx clear.  NECK:  Supple, no jugular venous distention. No thyroid enlargement, no tenderness.  LUNGS: Normal breath sounds bilaterally, no wheezing, rales,rhonchi or crepitation. No use of accessory muscles of respiration.  CARDIOVASCULAR: S1, S2 normal. No murmurs, rubs, or gallops.  ABDOMEN: Soft, nontender, nondistended. Bowel sounds present. No organomegaly or mass.  EXTREMITIES: Right hand is swollen warm to touch and erythematous  nEUROLOGIC: Cranial nerves II through XII are intact. Muscle strength 5/5 in all extremities. Sensation intact. Gait not checked.  PSYCHIATRIC: The patient is alert and oriented x 3.  SKIN: No obvious rash, lesion, or ulcer.   LABORATORY PANEL:   CBC Recent Labs  Lab 08/23/18 1116  WBC 21.6*  HGB 12.9*  HCT 36.0*  PLT 415  MCV 87.2  MCH 31.3  MCHC 35.9  RDW 13.5  LYMPHSABS 1.3  MONOABS 1.4*  EOSABS 0.1  BASOSABS 0.1   ------------------------------------------------------------------------------------------------------------------  Chemistries  Recent Labs  Lab 08/23/18 1116  NA 140  K 3.6  CL 103  CO2 26  GLUCOSE 105*  BUN 6  CREATININE 0.97  CALCIUM 8.7*  AST 16  ALT 10  ALKPHOS 50  BILITOT 0.8   ------------------------------------------------------------------------------------------------------------------ estimated creatinine clearance is 113 mL/min (by C-G formula based on SCr of 0.97 mg/dL). ------------------------------------------------------------------------------------------------------------------ No results for input(s): TSH, T4TOTAL, T3FREE, THYROIDAB in the last 72 hours.  Invalid input(s): FREET3   Coagulation profile No results for input(s): INR, PROTIME in the last 168 hours. ------------------------------------------------------------------------------------------------------------------- No results for input(s): DDIMER in the last 72  hours. -------------------------------------------------------------------------------------------------------------------  Cardiac Enzymes No results for input(s): CKMB, TROPONINI, MYOGLOBIN in the last 168 hours.  Invalid input(s): CK ------------------------------------------------------------------------------------------------------------------ Invalid input(s): POCBNP  ---------------------------------------------------------------------------------------------------------------  Urinalysis    Component Value Date/Time   COLORURINE YELLOW 03/15/2014 1928   APPEARANCEUR CLEAR 03/15/2014 1928   LABSPEC 1.006 03/15/2014 1928   PHURINE 6.0 03/15/2014 1928   GLUCOSEU NEGATIVE 03/15/2014 1928   HGBUR NEGATIVE 03/15/2014 1928   BILIRUBINUR NEGATIVE 03/15/2014 1928   KETONESUR NEGATIVE 03/15/2014 1928   PROTEINUR NEGATIVE 03/15/2014 1928   UROBILINOGEN 0.2 03/15/2014 1928   NITRITE NEGATIVE 03/15/2014 1928   LEUKOCYTESUR NEGATIVE 03/15/2014 1928     RADIOLOGY: Dg Hand Complete Right  Result Date: 08/23/2018 CLINICAL DATA:  Right hand swelling and redness. Symptoms began 2 days ago with sudden increase. EXAM: RIGHT HAND - COMPLETE 3+ VIEW COMPARISON:  None. FINDINGS: Extensive soft tissue swelling is present throughout hand. Degenerative changes are noted at the radiocarpal joint. Acute or focal osseous abnormality is present otherwise. No radiopaque foreign body is present. Soft tissue swelling extends to the level of the wrist. Most is agger is over the dorsum the hand. IMPRESSION: 1. Extensive soft tissue swelling without acute or focal associated osseous abnormality. Question cellulitis or trauma. 2. Degenerative changes at the radiocarpal joint. Electronically Signed   By: Marin Roberts M.D.   On: 08/23/2018 11:56  EKG: Orders placed or performed during the hospital encounter of 05/22/16  . EKG 12-Lead  . EKG 12-Lead  . ED EKG  . ED EKG  . EKG    IMPRESSION AND  PLAN: Patient is 47 year old presenting with right hand cellulitis  1.  Right hand cellulitis We will treat patient with IV vancomycin and Unasyn Follow blood cultures Orthopedic will be following patient for any surgical needs If no improvement consider MRI of the hand  2.  History of rheumatoid arthritis once infection improves patient can be restarted on his prednisone which he has not taken for 2 weeks because he ran out of it  3.  Depression anxiety continue home regimen  4.  Miscellaneous Lovenox for DVT prophylaxis   All the records are reviewed and case discussed with ED provider. Management plans discussed with the patient, family and they are in agreement.  CODE STATUS: Code Status History    Date Active Date Inactive Code Status Order ID Comments User Context   07/10/2016 0031 07/13/2016 1529 Full Code [id number]  Audery Amel, MD Inpatient   03/30/2016 1841 03/31/2016 1852 Full Code [id number]  Shari Prows, MD Inpatient   03/25/2016 2102 03/30/2016 1830 Full Code [id number]  Crissie Figures, MD ED   11/18/2015 1659 11/20/2015 1945 Full Code [id number]  Currie Paris, RN Inpatient   11/17/2015 1850 11/18/2015 1659 Full Code [id number]  Beau Fanny, MD Inpatient   11/13/2015 0604 11/17/2015 1850 Full Code [id number]  Arnaldo Natal, MD Inpatient   10/07/2015 1833 10/12/2015 2030 Full Code [id number]  Jimmy Footman, MD Inpatient   07/12/2015 1438 07/16/2015 1754 Full Code [id number]  Charm Rings, NP Inpatient   07/10/2015 1917 07/12/2015 1438 Full Code [id number]  Azalia Bilis, MD ED   06/25/2015 0624 06/26/2015 1510 Full Code [id number]  Paula Libra, MD ED   03/17/2014 1115 03/22/2014 1025 Full Code [id number]  Thresa Ross, MD Inpatient   03/17/2014 1100 03/17/2014 1115 Full Code [id number]  Nanine Means, NP Inpatient   03/15/2014 2254 03/17/2014 1100 Full Code [id number]  Warnell Forester, MD ED   02/10/2014 1511 02/12/2014 1828 Full Code [id number]  Trevor Mace, PA-C ED       TOTAL TIME TAKING CARE OF THIS PATIENT: 55 minutes.    Auburn Bilberry M.D on 08/23/2018 at 7:41 PM  Between 7am to 6pm - Pager - [phone]  After 6pm go to [email or web address] - password [email or web address] [facility]  [facility] Office  [phone]  CC: Primary care physician; Patient, No Pcp Per

## 2018-08-23 NOTE — ED Notes (Signed)
Right had had rash that he thought was poison ivey but today it started getting red and swollen.  Good circ to fingers.  Hand is swollen and tender and up to forearm.

## 2018-08-23 NOTE — Consult Note (Signed)
ORTHOPAEDIC CONSULTATION  REQUESTING PHYSICIAN: No att. providers found  Chief Complaint:   Right hand pain and swelling.  History of Present Illness: Curtis Cobb is a 47 y.o. male with a history of hypertension, rheumatoid arthritis, anxiety/depression, and ethanol abuse who was in his usual state of health until 2 days ago when he noted a small area of blistering over the dorsal aspect of the ring finger.  He noticed that there was some increased swelling and erythema localized around this area when he went to bed last night.  However, when he awoke this morning, there was significant swelling involving the entire hand which extended into the proximal forearm region.  Therefore, the patient presented to the emergency room for further evaluation and treatment.  The patient denies any specific injury to the ring finger, and denies feeling any insect bite that may have caused the punctate skin lesion.  He denies any numbness or paresthesias to his hand, and denies any fevers or chills.  Past Medical History:  Diagnosis Date  . Alcohol use disorder, moderate, dependence (HCC) 02/13/2014  . Anxiety   . Depression   . Hypertension   . Rheumatoid arthritis (HCC)   . Substance abuse (HCC)    History reviewed. No pertinent surgical history. Social History   Socioeconomic History  . Marital status: Single    Spouse name: Not on file  . Number of children: Not on file  . Years of education: Not on file  . Highest education level: Not on file  Occupational History  . Not on file  Social Needs  . Financial resource strain: Not on file  . Food insecurity:    Worry: Not on file    Inability: Not on file  . Transportation needs:    Medical: Not on file    Non-medical: Not on file  Tobacco Use  . Smoking status: Never Smoker  . Smokeless tobacco: Never Used  Substance and Sexual Activity  . Alcohol use: No    Comment:  alcoholic.  sober since 07-09-16  . Drug use: No    Types: Amphetamines, Marijuana, Cocaine, Heroin    Comment:  Last use was christmas 2016  . Sexual activity: Yes    Birth control/protection: None  Lifestyle  . Physical activity:    Days per week: Not on file    Minutes per session: Not on file  . Stress: Not on file  Relationships  . Social connections:    Talks on phone: Not on file    Gets together: Not on file    Attends religious service: Not on file    Active member of club or organization: Not on file    Attends meetings of clubs or organizations: Not on file    Relationship status: Not on file  Other Topics Concern  . Not on file  Social History Narrative  . Not on file   Family History  Problem Relation Age of Onset  . Heart attack Mother   . Stroke Mother   . Hypertension Mother   . Mental illness Mother        Manic Depressive Bipolar Disorder  . Heart disease Mother   . Hypertension Maternal Aunt   . Mental illness Maternal Aunt   . Hypertension Maternal Aunt   . Mental illness Maternal Aunt    Allergies  Allergen Reactions  . Sulfa Antibiotics Other (See Comments)    Unknown reaction   Prior to Admission medications   Medication Sig Start  Date End Date Taking? Authorizing Provider  QUEtiapine (SEROQUEL) 300 MG tablet Take 1 tablet (300 mg total) by mouth at bedtime. 07/13/16  Yes Jimmy Footman, MD  diclofenac (VOLTAREN) 75 MG EC tablet Take 1 tablet (75 mg total) by mouth 2 (two) times daily as needed. 02/23/17   Jacquelin Hawking, PA-C  ibuprofen (ADVIL,MOTRIN) 400 MG tablet Take 1 tablet (400 mg total) by mouth every 8 (eight) hours as needed. 01/17/17   Azalia Bilis, MD  predniSONE (DELTASONE) 20 MG tablet Take 3 tablets (60 mg total) by mouth daily. Please take with food 02/23/17   Ivery Quale, PA-C   Dg Hand Complete Right  Result Date: 08/23/2018 CLINICAL DATA:  Right hand swelling and redness. Symptoms began 2 days ago with sudden  increase. EXAM: RIGHT HAND - COMPLETE 3+ VIEW COMPARISON:  None. FINDINGS: Extensive soft tissue swelling is present throughout hand. Degenerative changes are noted at the radiocarpal joint. Acute or focal osseous abnormality is present otherwise. No radiopaque foreign body is present. Soft tissue swelling extends to the level of the wrist. Most is agger is over the dorsum the hand. IMPRESSION: 1. Extensive soft tissue swelling without acute or focal associated osseous abnormality. Question cellulitis or trauma. 2. Degenerative changes at the radiocarpal joint. Electronically Signed   By: Marin Roberts M.D.   On: 08/23/2018 11:56    Positive ROS: All other systems have been reviewed and were otherwise negative with the exception of those mentioned in the HPI and as above.  Physical Exam: General:  Alert, no acute distress Psychiatric:  Patient is competent for consent with normal mood and affect   Cardiovascular:  No pedal edema Respiratory:  No wheezing, non-labored breathing GI:  Abdomen is soft and non-tender Skin:  No lesions in the area of chief complaint Neurologic:  Sensation intact distally Lymphatic:  No axillary or cervical lymphadenopathy  Orthopedic Exam:  Orthopedic examination is limited to the right hand.  Skin inspection is notable for a small punctate ulceration over the dorsal aspect of the ring proximal phalanx midway between the MCP and DIP joints.  There is moderate erythema around this area encompassing the entirety of the dorsum of the proximal phalanx and extending into the dorsum of the MCP joint region.  There is no drainage from the ulceration.  He has 2+ swelling involving the entire dorsum of the hand and extending into the ring finger more so than the other fingers.  There are no other areas of erythema, other than that described above.  He is able to actively flex and extend all digits without significant pain or catching, although motion is limited due to the  swelling.  Passively, he can tolerate flexion and extension of both the ring MCP and PIP joints without pain to suggest a septic joint.  He has no tenderness along the flexor sheaths of the thumb, index, long, ring, or little fingers.  He is neurovascularly intact to all digits.  X-rays:  AP, lateral, and oblique views of the right hand are obtained.  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  Otherwise, no fractures, lytic lesions, or other acute bony abnormalities are identified.  Assessment: Right dorsal hand cellulitis secondary to a punctate skin lesion, possibly due to an insect bite.  Plan: I agree with the plan for admission for IV antibiotics for 24 to 48 hours in order to turn this infection around.  In addition, he should keep his hand  elevated as much as possible.  At this point, I see no need for surgical intervention.   Thank you for asked me to participate in the care of this most pleasant gentleman.  I will be happy to follow him with you.   Maryagnes Amos, MD  Beeper #:  [phone]  08/23/2018 5:52 PM

## 2018-08-23 NOTE — Progress Notes (Signed)
Pharmacy Antibiotic Note  Curtis Cobb is a 47 y.o. male admitted on 08/23/2018 with cellulitis.  Pharmacy has been consulted for Vancomycin and Unasyn dosing.  Plan: Vancomycin 1500 IV every 12 hours.  Goal trough 10-15 mcg/mL. Unasyn 3g IV q6h  Vancomycin trough prior to 5th dose  Height: 6\' 2"  (188 cm) Weight: 190 lb (86.2 kg) IBW/kg (Calculated) : 82.2  Temp (24hrs), Avg:98.1 F (36.7 C), Min:97.9 F (36.6 C), Max:98.3 F (36.8 C)  Recent Labs  Lab 08/23/18 1116 08/23/18 1333  WBC 21.6*  --   CREATININE 0.97  --   LATICACIDVEN  --  1.4    Estimated Creatinine Clearance: 113 mL/min (by C-G formula based on SCr of 0.97 mg/dL).    Allergies  Allergen Reactions  . Sulfa Antibiotics Other (See Comments)    Unknown reaction    Antimicrobials this admission: Cefazolin 8/27 x 1  Unasyn 8/27 >>  Vancomycin 8/27 >>  Thank you for allowing pharmacy to be a part of this patient's care.  9/27, PharmD, BCPS 08/23/2018 8:26 PM

## 2018-08-23 NOTE — Progress Notes (Signed)
PT c/o pain. Refused Ibuprofen and requested to have tramadol. Dr Anne Hahn made aware. Order for tramadol q6 hours received.

## 2018-08-23 NOTE — ED Notes (Signed)
Hospitalist in to see pt.

## 2018-08-23 NOTE — Progress Notes (Signed)
Pt complaining of so much pain. Continue to refuse ibuprofen and wants something stronger. Dr. Anne Hahn made aware. No new orders obtained at this time. Advised to encourage pt to take Ibuprofen.

## 2018-08-24 ENCOUNTER — Inpatient Hospital Stay: Payer: Self-pay

## 2018-08-24 LAB — BASIC METABOLIC PANEL
Anion gap: 10 (ref 5–15)
BUN: 7 mg/dL (ref 6–20)
CO2: 25 mmol/L (ref 22–32)
Calcium: 7.9 mg/dL — ABNORMAL LOW (ref 8.9–10.3)
Chloride: 105 mmol/L (ref 98–111)
Creatinine, Ser: 0.76 mg/dL (ref 0.61–1.24)
GFR calc non Af Amer: >60 mL/min (ref >60)
Glucose, Bld: 97 mg/dL (ref 70–99)
POTASSIUM: 3 mmol/L — AB (ref 3.5–5.1)
SODIUM: 140 mmol/L (ref 135–145)

## 2018-08-24 LAB — CBC
HEMATOCRIT: 32.7 % — AB (ref 40.0–52.0)
HEMOGLOBIN: 11.1 g/dL — AB (ref 13.0–18.0)
MCH: 29.9 pg (ref 26.0–34.0)
MCHC: 33.9 g/dL (ref 32.0–36.0)
MCV: 88.3 fL (ref 80.0–100.0)
Platelets: 333 10*3/uL (ref 150–440)
RBC: 3.7 MIL/uL — AB (ref 4.40–5.90)
RDW: 13.9 % (ref 11.5–14.5)
WBC: 20.1 10*3/uL — ABNORMAL HIGH (ref 3.8–10.6)

## 2018-08-24 LAB — MAGNESIUM: MAGNESIUM: 1.9 mg/dL (ref 1.7–2.4)

## 2018-08-24 MED ORDER — POTASSIUM CHLORIDE CRYS ER 20 MEQ PO TBCR
40.0000 meq | EXTENDED_RELEASE_TABLET | ORAL | Status: AC
  Administered 2018-08-24 (×2): 40 meq via ORAL
  Filled 2018-08-24 (×2): qty 2

## 2018-08-24 MED ORDER — ACETAMINOPHEN 325 MG PO TABS
650.0000 mg | ORAL_TABLET | Freq: Four times a day (QID) | ORAL | Status: DC | PRN
  Filled 2018-08-24: qty 2

## 2018-08-24 MED ORDER — KETOROLAC TROMETHAMINE 30 MG/ML IJ SOLN
30.0000 mg | Freq: Three times a day (TID) | INTRAMUSCULAR | Status: AC
  Administered 2018-08-24 – 2018-08-26 (×5): 30 mg via INTRAVENOUS
  Filled 2018-08-24 (×5): qty 1

## 2018-08-24 MED ORDER — METHYLPREDNISOLONE SODIUM SUCC 125 MG IJ SOLR: 60.0000 mg | INTRAMUSCULAR | Status: DC

## 2018-08-24 MED ORDER — LORAZEPAM 2 MG/ML IJ SOLN
2.0000 mg | Freq: Once | INTRAMUSCULAR | Status: AC
  Administered 2018-08-24: 2 mg via INTRAVENOUS
  Filled 2018-08-24: qty 1

## 2018-08-24 MED ORDER — MORPHINE SULFATE (PF) 2 MG/ML IV SOLN
2.0000 mg | INTRAVENOUS | Status: DC | PRN
  Administered 2018-08-24: 2 mg via INTRAVENOUS
  Filled 2018-08-24: qty 1

## 2018-08-24 MED ORDER — METHYLPREDNISOLONE SODIUM SUCC 40 MG IJ SOLR
40.0000 mg | INTRAMUSCULAR | Status: DC
  Administered 2018-08-24 – 2018-08-25 (×2): 40 mg via INTRAVENOUS
  Filled 2018-08-24 (×2): qty 1

## 2018-08-24 NOTE — Progress Notes (Signed)
[facility] -  at [facility]   PATIENT NAME: Curtis Cobb    MR#:  [id number]  DATE OF BIRTH:  1971-12-01  SUBJECTIVE:  CHIEF COMPLAINT:   Chief Complaint  Patient presents with  . Arm Swelling   -Still with significant right hand swelling, erythema noted.  Increased erythematous spot noted on the fourth digit.  REVIEW OF SYSTEMS:  Review of Systems  Constitutional: Negative for chills and fever.  HENT: Negative for congestion, ear discharge, hearing loss and nosebleeds.   Eyes: Negative for blurred vision and double vision.  Respiratory: Negative for cough, shortness of breath and wheezing.   Cardiovascular: Negative for chest pain and palpitations.  Gastrointestinal: Negative for abdominal pain, constipation, diarrhea, nausea and vomiting.  Genitourinary: Negative for dysuria and urgency.  Musculoskeletal: Positive for joint pain and myalgias.  Neurological: Negative for dizziness, focal weakness, seizures, weakness and headaches.  Psychiatric/Behavioral: Negative for depression.    DRUG ALLERGIES:   Allergies  Allergen Reactions  . Sulfa Antibiotics Other (See Comments)    Unknown reaction    VITALS:  Blood pressure 115/69, pulse 93, temperature 98.7 F (37.1 C), temperature source Oral, resp. rate 18, height 6\' 2"  (1.88 m), weight 88.7 kg, SpO2 99 %.  PHYSICAL EXAMINATION:  Physical Exam   GENERAL:  47 y.o.-year-old patient lying in the bed with no acute distress.  EYES: Pupils equal, round, reactive to light and accommodation. No scleral icterus. Extraocular muscles intact.  HEENT: Head atraumatic, normocephalic. Oropharynx and nasopharynx clear.  NECK:  Supple, no jugular venous distention. No thyroid enlargement, no tenderness.  LUNGS: Normal breath sounds bilaterally, no wheezing, rales,rhonchi or crepitation. No use of accessory muscles of respiration.  CARDIOVASCULAR: S1, S2 normal. No murmurs, rubs, or gallops.  ABDOMEN: Soft,  nontender, nondistended. Bowel sounds present. No organomegaly or mass.  EXTREMITIES: 3+ edema noted on the dorsum of the right hand with erythema surrounding and spreading onto the arm.  Increased redness and purulent spots noted at the base of the fourth digit on the dorsal side.  No pedal edema, cyanosis, or clubbing.  NEUROLOGIC: Cranial nerves II through XII are intact. Muscle strength 5/5 in all extremities. Sensation intact. Gait not checked.  PSYCHIATRIC: The patient is alert and oriented x 3.  SKIN: No obvious rash, lesion, or ulcer.    LABORATORY PANEL:   CBC Recent Labs  Lab 08/24/18 0514  WBC 20.1*  HGB 11.1*  HCT 32.7*  PLT 333   ------------------------------------------------------------------------------------------------------------------  Chemistries  Recent Labs  Lab 08/23/18 1116 08/24/18 0514  NA 140 140  K 3.6 3.0*  CL 103 105  CO2 26 25  GLUCOSE 105* 97  BUN 6 7  CREATININE 0.97 0.76  CALCIUM 8.7* 7.9*  AST 16  --   ALT 10  --   ALKPHOS 50  --   BILITOT 0.8  --    ------------------------------------------------------------------------------------------------------------------  Cardiac Enzymes No results for input(s): TROPONINI in the last 168 hours. ------------------------------------------------------------------------------------------------------------------  RADIOLOGY:  Dg Hand Complete Right  Result Date: 08/23/2018 CLINICAL DATA:  Right hand swelling and redness. Symptoms began 2 days ago with sudden increase. EXAM: RIGHT HAND - COMPLETE 3+ VIEW COMPARISON:  None. FINDINGS: Extensive soft tissue swelling is present throughout hand. Degenerative changes are noted at the radiocarpal joint. Acute or focal osseous abnormality is present otherwise. No radiopaque foreign body is present. Soft tissue swelling extends to the level of the wrist. Most is agger is over the dorsum the hand. IMPRESSION: 1.  Extensive soft tissue swelling without acute  or focal associated osseous abnormality. Question cellulitis or trauma. 2. Degenerative changes at the radiocarpal joint. Electronically Signed   By: Marin Roberts M.D.   On: 08/23/2018 11:56    EKG:   Orders placed or performed during the hospital encounter of 05/22/16  . EKG 12-Lead  . EKG 12-Lead  . ED EKG  . ED EKG  . EKG    ASSESSMENT AND PLAN:   47 year old male with past medical history significant for hypertension, rheumatoid arthritis and alcohol use presents to hospital secondary to right hand swelling and erythema.  1.  Right hand cellulitis-concern for poison ivy exposure as well. -Could be inflammatory reaction too. -Started on IV steroids today.  Continue with vancomycin and Unasyn. -Follow-up blood cultures.  Appreciate orthopedic's consult.  No indication for any surgical procedure at this time. -Continue to monitor, if no improvement-consider MRI of the hand  2.  History of rheumatoid arthritis-currently on prednisone.  Monitor.  Might need medications at discharge.  3.  Hypokalemia-being replaced  4.  Depression anxiety-continue Seroquel  5.  DVT prophylaxis-on Lovenox     All the records are reviewed and case discussed with Care Management/Social Workerr. Management plans discussed with the patient, family and they are in agreement.  CODE STATUS: Full code  TOTAL TIME TAKING CARE OF THIS PATIENT: 36 minutes.   POSSIBLE D/C IN 1-2 DAYS, DEPENDING ON CLINICAL CONDITION.   Iver Fehrenbach M.D on 08/24/2018 at 9:51 AM  Between 7am to 6pm - Pager - [phone]  After 6pm go to [email or web address] - password [email or web address]  [facility]  Office  [phone]  CC: Primary care physician; Patient, No Pcp Per

## 2018-08-24 NOTE — Progress Notes (Signed)
Pharmacy Antibiotic Note  Curtis Cobb is a 47 y.o. male admitted on 08/23/2018 with cellulitis.  Pharmacy has been consulted for Vancomycin and Unasyn dosing.  Plan: Vancomycin 1500 IV every 12 hours.  Goal trough 10-15 mcg/mL. Unasyn 3g IV q6h  Vancomycin trough prior to 5th dose, 0829 @ 1930  Kinetics Ke 0.087 Vd 62.09 T1/2 8 hrs Cmin 16.45  Height: 6\' 2"  (188 cm) Weight: 195 lb 9.6 oz (88.7 kg) IBW/kg (Calculated) : 82.2  Temp (24hrs), Avg:98.3 F (36.8 C), Min:97.9 F (36.6 C), Max:98.7 F (37.1 C)  Recent Labs  Lab 08/23/18 1116 08/23/18 1333 08/23/18 2056 08/24/18 0514  WBC 21.6*  --   --  20.1*  CREATININE 0.97  --   --  0.76  LATICACIDVEN  --  1.4 1.6  --     Estimated Creatinine Clearance: 137 mL/min (by C-G formula based on SCr of 0.76 mg/dL).    Allergies  Allergen Reactions  . Sulfa Antibiotics Other (See Comments)    Unknown reaction    Antimicrobials this admission: Cefazolin 8/27 x 1  Unasyn 8/27 >>  Vancomycin 8/27 >>  Thank you for allowing pharmacy to be a part of this patient's care.   9/27, PharmD Pharmacy Resident  08/24/2018 8:03 AM

## 2018-08-24 NOTE — Clinical Social Work Note (Signed)
Clinical Social Work Assessment  Patient Details  Name: Curtis Cobb MRN: [id number] Date of Birth: Aug 02, 1971  Date of referral:  08/24/18               Reason for consult:  [employer], Substance Use/ETOH Abuse                Permission sought to share information with:  Case Manager Permission granted to share information::  Yes, Verbal Permission Granted  Name::        Agency::     Relationship::     Contact Information:     Housing/Transportation Living arrangements for the past 2 months:  Single Family Home Source of Information:  Patient Patient Interpreter Needed:  None Criminal Activity/Legal Involvement Pertinent to Current Situation/Hospitalization:  No - Comment as needed Significant Relationships:  Friend Lives with:  Friends Do you feel safe going back to the place where you live?  Yes Need for family participation in patient care:  Yes (Comment)  Care giving concerns:  Patient lives in [place] with his friend and her mother.    Social Worker assessment / plan:  Holiday representative ([place]) reviewed chart and noted that patient has a history of alcohol use. CSW met with patient alone at bedside to provide resources. Patient was alert and oriented X4 and was sitting up in the bed. CSW introduced self and explained role of CSW department. Per patient he lives with his friend and her mother in [place]. Per patient he has no children and is currently unemployed. Patient reported that he has no income and no health insurance. Patient denied alcohol and drug use. Per patient he has a driver's license but does not have car. Per patient he has used drugs in the past but reported that he has a strong support system which helps him to remain clean. CSW provided patient with a list of [employer] including transportation, food, shelter, medicaid and outpatient substance abuse resources. Patient accepted resources. Patient reported no other needs or concerns.  CSW will continue to follow and assist as needed.    Employment status:  Unemployed Forensic scientist:  Self Pay (Medicaid Pending) PT Recommendations:  Not assessed at this time Information / Referral to community resources:  Outpatient Substance Abuse Treatment Options  Patient/Family's Response to care:  Patient accepted resources.   Patient/Family's Understanding of and Emotional Response to Diagnosis, Current Treatment, and Prognosis:  Patient was very pleasant and thanked CSW for visit.   Emotional Assessment Appearance:  Appears stated age Attitude/Demeanor/Rapport:    Affect (typically observed):  Accepting, Adaptable, Pleasant Orientation:  Oriented to Self, Oriented to  Time, Oriented to Place, Oriented to Situation Alcohol / Substance use:  Illicit Drugs, Alcohol Use Psych involvement (Current and /or in the community):  No (Comment)  Discharge Needs  Concerns to be addressed:  Discharge Planning Concerns Readmission within the last 30 days:  No Current discharge risk:  Substance Abuse Barriers to Discharge:  Continued Medical Work up   [employer], Veronia Beets, LCSW 08/24/2018, 1:36 PM

## 2018-08-24 NOTE — Progress Notes (Signed)
Patient verbalizes the need for medicine prior to MRI due to being claustrophobic. MD. Lind Guest paged and received verbal orders for Ativan. See MAR.

## 2018-08-24 NOTE — Progress Notes (Signed)
Subjective: The patient notes little change in his symptoms since yesterday.  He continues to have swelling and an achy discomfort in his hand extending into the distal forearm region.  He denies any numbness or paresthesias to his fingers.   Objective: Vital signs in last 24 hours: Temp:  [97.9 F (36.6 C)-98.7 F (37.1 C)] 98.7 F (37.1 C) (08/28 0740) Pulse Rate:  [82-94] 93 (08/28 0740) Resp:  [14-18] 18 (08/27 2034) BP: (114-136)/(63-69) 115/69 (08/28 0740) SpO2:  [88 %-99 %] 99 % (08/28 0740) Weight:  [88.7 kg] 88.7 kg (08/27 2342)  Intake/Output from previous day: 08/27 0701 - 08/28 0700 In: 598.7 [IV Piggyback:598.7] Out: -  Intake/Output this shift: No intake/output data recorded.  Recent Labs    08/23/18 1116 08/24/18 0514  HGB 12.9* 11.1*   Recent Labs    08/23/18 1116 08/24/18 0514  WBC 21.6* 20.1*  RBC 4.13* 3.70*  HCT 36.0* 32.7*  PLT 415 333   Recent Labs    08/23/18 1116 08/24/18 0514  NA 140 140  K 3.6 3.0*  CL 103 105  CO2 26 25  BUN 6 7  CREATININE 0.97 0.76  GLUCOSE 105* 97  CALCIUM 8.7* 7.9*   No results for input(s): LABPT, INR in the last 72 hours.  Physical Exam: Orthopedic examination again is limited to the right hand and upper extremity.  The patient has 2-3+ swelling involving his entire hand extending into his fingers.  This is unchanged as compared to yesterday and in fact may be slightly worse.  The localized erythema over the dorsal aspect of the ring proximal phalanx is unchanged.  He is beginning to show evidence of early blistering in this area, as expected.  He does appear to have very mild faint erythema over the remainder of his hand extending to the mid forearm.  He has mild tenderness diffusely to firm palpation in the first webspace.  Again, he is able to actively flex and extend all digits, although range of motion is limited due to the swelling.  Sensation is intact all digits.  He has good capillary refill to all  digits.  Assessment: Right hand cellulitis.  Plan: The lack of improvement in his symptoms over the past 24 hours is concerning.  I agree with adding vancomycin to his antibiotic regimen.  In addition, I feel it is reasonable to obtain an MRI scan of the hand to look for evidence of an underlying abscess or to look for evidence of early necrotizing fasciitis.  Meanwhile, continue close observation and hand elevation.   Excell Seltzer Poggi 08/24/2018, 11:57 AM

## 2018-08-25 ENCOUNTER — Inpatient Hospital Stay: Payer: Self-pay | Admitting: Anesthesiology

## 2018-08-25 ENCOUNTER — Encounter
Admission: EM | Disposition: A | Discharge status: Home / Self Care | Payer: Self-pay | Source: Home / Self Care | Attending: Internal Medicine

## 2018-08-25 ENCOUNTER — Encounter: Payer: Self-pay | Admitting: *Deleted

## 2018-08-25 HISTORY — PX: INCISION AND DRAINAGE ABSCESS: SHX5864

## 2018-08-25 LAB — BASIC METABOLIC PANEL
Anion gap: 4 — ABNORMAL LOW (ref 5–15)
BUN: 12 mg/dL (ref 6–20)
CALCIUM: 8.3 mg/dL — AB (ref 8.9–10.3)
CO2: 24 mmol/L (ref 22–32)
CREATININE: 0.71 mg/dL (ref 0.61–1.24)
Chloride: 110 mmol/L (ref 98–111)
GFR calc non Af Amer: >60 mL/min (ref >60)
GLUCOSE: 134 mg/dL — AB (ref 70–99)
Potassium: 4.3 mmol/L (ref 3.5–5.1)
Sodium: 138 mmol/L (ref 135–145)

## 2018-08-25 LAB — CBC
HEMATOCRIT: 31 % — AB (ref 40.0–52.0)
Hemoglobin: 10.5 g/dL — ABNORMAL LOW (ref 13.0–18.0)
MCH: 29.7 pg (ref 26.0–34.0)
MCHC: 33.7 g/dL (ref 32.0–36.0)
MCV: 88.2 fL (ref 80.0–100.0)
Platelets: 329 10*3/uL (ref 150–440)
RBC: 3.52 MIL/uL — ABNORMAL LOW (ref 4.40–5.90)
RDW: 13.8 % (ref 11.5–14.5)
WBC: 21.5 10*3/uL — ABNORMAL HIGH (ref 3.8–10.6)

## 2018-08-25 LAB — MRSA PCR SCREENING: MRSA by PCR: NEGATIVE

## 2018-08-25 LAB — HIV ANTIBODY (ROUTINE TESTING W REFLEX): HIV SCREEN 4TH GENERATION: NONREACTIVE

## 2018-08-25 SURGERY — INCISION AND DRAINAGE, ABSCESS
Anesthesia: General | Laterality: Right | Wound class: "Dirty or Infected "

## 2018-08-25 MED ORDER — METOCLOPRAMIDE HCL 10 MG PO TABS
5.0000 mg | ORAL_TABLET | Freq: Three times a day (TID) | ORAL | Status: DC | PRN
  Filled 2018-08-25: qty 1

## 2018-08-25 MED ORDER — PIPERACILLIN-TAZOBACTAM 3.375 G IVPB 30 MIN
3.3750 g | Freq: Once | INTRAVENOUS | Status: AC
  Administered 2018-08-25: 3.375 g via INTRAVENOUS
  Filled 2018-08-25: qty 50

## 2018-08-25 MED ORDER — FENTANYL CITRATE (PF) 100 MCG/2ML IJ SOLN
INTRAMUSCULAR | Status: AC
  Filled 2018-08-25: qty 2

## 2018-08-25 MED ORDER — PROPOFOL 10 MG/ML IV BOLUS
INTRAVENOUS | Status: AC
  Filled 2018-08-25: qty 20

## 2018-08-25 MED ORDER — ONDANSETRON HCL 4 MG/2ML IJ SOLN
4.0000 mg | Freq: Four times a day (QID) | INTRAMUSCULAR | Status: DC | PRN
  Administered 2018-08-27 – 2018-08-28 (×2): 4 mg via INTRAVENOUS
  Filled 2018-08-25 (×2): qty 2

## 2018-08-25 MED ORDER — SODIUM CHLORIDE 0.9 % IV SOLN
INTRAVENOUS | Status: DC
  Administered 2018-08-25: 17:00:00 via INTRAVENOUS

## 2018-08-25 MED ORDER — ONDANSETRON HCL 4 MG PO TABS: 4.0000 mg | ORAL_TABLET | Freq: Four times a day (QID) | ORAL | Status: DC | PRN

## 2018-08-25 MED ORDER — OXYCODONE HCL 5 MG PO TABS: 5.0000 mg | ORAL_TABLET | Freq: Once | ORAL | Status: DC | PRN

## 2018-08-25 MED ORDER — FENTANYL CITRATE (PF) 100 MCG/2ML IJ SOLN
INTRAMUSCULAR | Status: AC
  Administered 2018-08-25: 50 ug via INTRAVENOUS
  Filled 2018-08-25: qty 2

## 2018-08-25 MED ORDER — OXYCODONE HCL 5 MG/5ML PO SOLN: 5.0000 mg | Freq: Once | ORAL | Status: DC | PRN

## 2018-08-25 MED ORDER — ONDANSETRON HCL 4 MG/2ML IJ SOLN
INTRAMUSCULAR | Status: DC | PRN
  Administered 2018-08-25: 4 mg via INTRAVENOUS

## 2018-08-25 MED ORDER — LACTATED RINGERS IV SOLN
INTRAVENOUS | Status: DC | PRN
  Administered 2018-08-25: 14:00:00 via INTRAVENOUS

## 2018-08-25 MED ORDER — LIDOCAINE HCL (CARDIAC) PF 100 MG/5ML IV SOSY
PREFILLED_SYRINGE | INTRAVENOUS | Status: DC | PRN
  Administered 2018-08-25: 100 mg via INTRAVENOUS

## 2018-08-25 MED ORDER — HYDROMORPHONE HCL 1 MG/ML IJ SOLN
0.2500 mg | INTRAMUSCULAR | Status: DC | PRN
  Administered 2018-08-25: 0.25 mg via INTRAVENOUS

## 2018-08-25 MED ORDER — OXYCODONE HCL 5 MG PO TABS
5.0000 mg | ORAL_TABLET | ORAL | Status: DC | PRN
  Administered 2018-08-25 – 2018-09-01 (×16): 10 mg via ORAL
  Filled 2018-08-25 (×16): qty 2

## 2018-08-25 MED ORDER — DIPHENHYDRAMINE HCL 12.5 MG/5ML PO ELIX
12.5000 mg | ORAL_SOLUTION | ORAL | Status: DC | PRN
  Filled 2018-08-25: qty 10

## 2018-08-25 MED ORDER — ACETAMINOPHEN 10 MG/ML IV SOLN
INTRAVENOUS | Status: AC
  Filled 2018-08-25: qty 100

## 2018-08-25 MED ORDER — METOCLOPRAMIDE HCL 5 MG/ML IJ SOLN: 5.0000 mg | Freq: Three times a day (TID) | INTRAMUSCULAR | Status: DC | PRN

## 2018-08-25 MED ORDER — HYDROMORPHONE HCL 1 MG/ML IJ SOLN
0.5000 mg | INTRAMUSCULAR | Status: DC | PRN
  Administered 2018-08-27: 15:00:00 1 mg via INTRAVENOUS
  Administered 2018-08-27: 10:00:00 0.5 mg via INTRAVENOUS
  Administered 2018-08-27 – 2018-08-29 (×6): 1 mg via INTRAVENOUS
  Filled 2018-08-25 (×8): qty 1

## 2018-08-25 MED ORDER — SUCCINYLCHOLINE CHLORIDE 20 MG/ML IJ SOLN
INTRAMUSCULAR | Status: DC | PRN
  Administered 2018-08-25: 100 mg via INTRAVENOUS

## 2018-08-25 MED ORDER — MAGNESIUM HYDROXIDE 400 MG/5ML PO SUSP
30.0000 mL | Freq: Every day | ORAL | Status: DC | PRN
  Filled 2018-08-25: qty 30

## 2018-08-25 MED ORDER — MIDAZOLAM HCL 2 MG/2ML IJ SOLN
INTRAMUSCULAR | Status: AC
  Filled 2018-08-25: qty 2

## 2018-08-25 MED ORDER — DOCUSATE SODIUM 100 MG PO CAPS
100.0000 mg | ORAL_CAPSULE | Freq: Two times a day (BID) | ORAL | Status: DC
  Administered 2018-08-25 – 2018-09-01 (×13): 100 mg via ORAL
  Filled 2018-08-25 (×13): qty 1

## 2018-08-25 MED ORDER — ROCURONIUM BROMIDE 100 MG/10ML IV SOLN
INTRAVENOUS | Status: DC | PRN
  Administered 2018-08-25: 20 mg via INTRAVENOUS
  Administered 2018-08-25: 10 mg via INTRAVENOUS

## 2018-08-25 MED ORDER — NEOMYCIN-POLYMYXIN B GU 40-200000 IR SOLN
Status: DC | PRN
  Administered 2018-08-25: 2 mL

## 2018-08-25 MED ORDER — ROCURONIUM BROMIDE 50 MG/5ML IV SOLN
INTRAVENOUS | Status: AC
  Filled 2018-08-25: qty 8

## 2018-08-25 MED ORDER — MIDAZOLAM HCL 2 MG/2ML IJ SOLN
INTRAMUSCULAR | Status: DC | PRN
  Administered 2018-08-25: 2 mg via INTRAVENOUS

## 2018-08-25 MED ORDER — SUGAMMADEX SODIUM 200 MG/2ML IV SOLN
INTRAVENOUS | Status: AC
  Filled 2018-08-25: qty 2

## 2018-08-25 MED ORDER — BISACODYL 10 MG RE SUPP
10.0000 mg | Freq: Every day | RECTAL | Status: DC | PRN
  Filled 2018-08-25: qty 1

## 2018-08-25 MED ORDER — FENTANYL CITRATE (PF) 100 MCG/2ML IJ SOLN
INTRAMUSCULAR | Status: AC
  Administered 2018-08-25: 25 ug via INTRAVENOUS
  Filled 2018-08-25: qty 2

## 2018-08-25 MED ORDER — HYDROMORPHONE HCL 1 MG/ML IJ SOLN
INTRAMUSCULAR | Status: AC
  Administered 2018-08-25: 0.25 mg via INTRAVENOUS
  Filled 2018-08-25: qty 1

## 2018-08-25 MED ORDER — ACETAMINOPHEN 10 MG/ML IV SOLN
INTRAVENOUS | Status: DC | PRN
  Administered 2018-08-25: 1000 mg via INTRAVENOUS

## 2018-08-25 MED ORDER — FENTANYL CITRATE (PF) 100 MCG/2ML IJ SOLN
25.0000 ug | INTRAMUSCULAR | Status: AC | PRN
  Administered 2018-08-25 (×3): 25 ug via INTRAVENOUS
  Administered 2018-08-25: 50 ug via INTRAVENOUS
  Administered 2018-08-25: 25 ug via INTRAVENOUS
  Administered 2018-08-25: 50 ug via INTRAVENOUS

## 2018-08-25 MED ORDER — FLEET ENEMA 7-19 GM/118ML RE ENEM: 1.0000 | ENEMA | Freq: Once | RECTAL | Status: DC | PRN

## 2018-08-25 MED ORDER — DEXAMETHASONE SODIUM PHOSPHATE 10 MG/ML IJ SOLN
INTRAMUSCULAR | Status: DC | PRN
  Administered 2018-08-25: 10 mg via INTRAVENOUS

## 2018-08-25 MED ORDER — FENTANYL CITRATE (PF) 100 MCG/2ML IJ SOLN
INTRAMUSCULAR | Status: DC | PRN
  Administered 2018-08-25 (×3): 50 ug via INTRAVENOUS

## 2018-08-25 MED ORDER — PREDNISONE 50 MG PO TABS
50.0000 mg | ORAL_TABLET | Freq: Every day | ORAL | Status: DC
  Administered 2018-08-26: 50 mg via ORAL
  Filled 2018-08-25 (×2): qty 1

## 2018-08-25 MED ORDER — PROPOFOL 10 MG/ML IV BOLUS
INTRAVENOUS | Status: DC | PRN
  Administered 2018-08-25: 200 mg via INTRAVENOUS

## 2018-08-25 SURGICAL SUPPLY — 39 items
BLADE SURG SZ10 CARB STEEL (BLADE) ×3 IMPLANT
BNDG COHESIVE 4X5 TAN STRL (GAUZE/BANDAGES/DRESSINGS) ×3 IMPLANT
BNDG ESMARK 4X12 TAN STRL LF (GAUZE/BANDAGES/DRESSINGS) ×3 IMPLANT
CANISTER SUCT 1200ML W/VALVE (MISCELLANEOUS) ×3 IMPLANT
CHLORAPREP W/TINT 26ML (MISCELLANEOUS) ×3 IMPLANT
CUFF TOURN 18 STER (MISCELLANEOUS) ×2 IMPLANT
CUFF TOURN 24 STER (MISCELLANEOUS) IMPLANT
DRAIN PENROSE 1/4X12 LTX (DRAIN) ×2 IMPLANT
ELECT REM PT RETURN 9FT ADLT (ELECTROSURGICAL) ×3
ELECTRODE REM PT RTRN 9FT ADLT (ELECTROSURGICAL) ×1 IMPLANT
GLOVE BIO SURGEON STRL SZ8 (GLOVE) ×3 IMPLANT
GLOVE INDICATOR 8.0 STRL GRN (GLOVE) ×3 IMPLANT
GLOVE SURG ORTHO 8.5 STRL (GLOVE) ×3 IMPLANT
GOWN STRL REUS W/ TWL LRG LVL3 (GOWN DISPOSABLE) ×1 IMPLANT
GOWN STRL REUS W/ TWL XL LVL3 (GOWN DISPOSABLE) ×1 IMPLANT
GOWN STRL REUS W/TWL LRG LVL3 (GOWN DISPOSABLE) ×2
GOWN STRL REUS W/TWL XL LVL3 (GOWN DISPOSABLE) ×2
KIT TURNOVER KIT A (KITS) ×3 IMPLANT
LABEL OR SOLS (LABEL) ×3 IMPLANT
NDL SAFETY ECLIPSE 18X1.5 (NEEDLE) ×1 IMPLANT
NEEDLE HYPO 18GX1.5 SHARP (NEEDLE) ×2
NS IRRIG 1000ML POUR BTL (IV SOLUTION) ×3 IMPLANT
PACK EXTREMITY ARMC (MISCELLANEOUS) ×3 IMPLANT
PAD CAST CTTN 4X4 STRL (SOFTGOODS) ×1 IMPLANT
PADDING CAST COTTON 4X4 STRL (SOFTGOODS) ×2
SPLINT CAST 1 STEP 3X12 (MISCELLANEOUS) ×3 IMPLANT
SPONGE LAP 18X18 RF (DISPOSABLE) ×3 IMPLANT
STAPLER SKIN PROX 35W (STAPLE) ×3 IMPLANT
STOCKINETTE BIAS CUT 4 980044 (GAUZE/BANDAGES/DRESSINGS) ×3 IMPLANT
STOCKINETTE IMPERVIOUS 9X36 MD (GAUZE/BANDAGES/DRESSINGS) ×3 IMPLANT
SUT ETHILON 4-0 (SUTURE) ×2
SUT ETHILON 4-0 FS2 18XMFL BLK (SUTURE) ×1
SUT PROLENE 4 0 PS 2 18 (SUTURE) ×2 IMPLANT
SUT VIC AB 2-0 CT1 36 (SUTURE) ×3 IMPLANT
SUT VIC AB 4-0 SH 27 (SUTURE) ×2
SUT VIC AB 4-0 SH 27XANBCTRL (SUTURE) ×1 IMPLANT
SUT VICRYL+ 3-0 36IN CT-1 (SUTURE) ×3 IMPLANT
SUTURE ETHLN 4-0 FS2 18XMF BLK (SUTURE) ×1 IMPLANT
SYR 10ML LL (SYRINGE) ×3 IMPLANT

## 2018-08-25 NOTE — Anesthesia Preprocedure Evaluation (Signed)
Anesthesia Evaluation  Patient identified by MRN, date of birth, ID band Patient awake    Reviewed: Allergy & Precautions, H&P , NPO status , Patient's Chart, lab work & pertinent test results  History of Anesthesia Complications Negative for: history of anesthetic complications  Airway Mallampati: II  TM Distance: >3 FB Neck ROM: full    Dental  (+) Chipped, Poor Dentition, Missing   Pulmonary neg pulmonary ROS, neg shortness of breath,           Cardiovascular Exercise Tolerance: Good hypertension, (-) angina(-) Past MI and (-) DOE      Neuro/Psych PSYCHIATRIC DISORDERS Anxiety Depression negative neurological ROS     GI/Hepatic negative GI ROS, Neg liver ROS, neg GERD  ,  Endo/Other  negative endocrine ROS  Renal/GU      Musculoskeletal  (+) Arthritis ,   Abdominal   Peds  Hematology negative hematology ROS (+)   Anesthesia Other Findings Past Medical History: 02/13/2014: Alcohol use disorder, moderate, dependence (HCC) No date: Anxiety No date: Depression No date: Hypertension No date: Rheumatoid arthritis (HCC) No date: Substance abuse (HCC)  Past Surgical History: No date: none  BMI    Body Mass Index:  25.11 kg/m      Reproductive/Obstetrics negative OB ROS                             Anesthesia Physical Anesthesia Plan  ASA: III and emergent  Anesthesia Plan: General ETT, Rapid Sequence and Cricoid Pressure   Post-op Pain Management:    Induction: Intravenous  PONV Risk Score and Plan: Dexamethasone, Midazolam, Ondansetron and Treatment may vary due to age or medical condition  Airway Management Planned: Oral ETT  Additional Equipment:   Intra-op Plan:   Post-operative Plan: Extubation in OR  Informed Consent: I have reviewed the patients History and Physical, chart, labs and discussed the procedure including the risks, benefits and alternatives for the  proposed anesthesia with the patient or authorized representative who has indicated his/her understanding and acceptance.   Dental Advisory Given  Plan Discussed with: Anesthesiologist, CRNA and Surgeon  Anesthesia Plan Comments: (Spoke with Dr. Joice Lofts, he would like to proceed emergently with this case.  Patient consented for risks of anesthesia including but not limited to:  - adverse reactions to medications - damage to teeth, lips or other oral mucosa - sore throat or hoarseness - Damage to heart, brain, lungs or loss of life  Patient voiced understanding.)        Anesthesia Quick Evaluation

## 2018-08-25 NOTE — Progress Notes (Signed)
Subjective: The patient notes some improvement in his right hand symptoms as compared to yesterday.  He feels that he is able to move his fingers more freely and that the swelling appears to be somewhat reduced.   Objective: Vital signs in last 24 hours: Temp:  [98.1 F (36.7 C)-98.7 F (37.1 C)] 98.1 F (36.7 C) (08/29 0748) Pulse Rate:  [62-76] 62 (08/29 0748) Resp:  [17-18] 17 (08/29 0748) BP: (105-122)/(64-72) 105/64 (08/29 0748) SpO2:  [97 %-99 %] 99 % (08/29 0748)  Intake/Output from previous day: 08/28 0701 - 08/29 0700 In: 1229.3 [P.O.:5; IV Piggyback:1224.3] Out: -  Intake/Output this shift: Total I/O In: 525.8 [IV Piggyback:525.8] Out: -   Recent Labs    08/23/18 1116 08/24/18 0514 08/25/18 0338  HGB 12.9* 11.1* 10.5*   Recent Labs    08/24/18 0514 08/25/18 0338  WBC 20.1* 21.5*  RBC 3.70* 3.52*  HCT 32.7* 31.0*  PLT 333 329   Recent Labs    08/24/18 0514 08/25/18 0338  NA 140 138  K 3.0* 4.3  CL 105 110  CO2 25 24  BUN 7 12  CREATININE 0.76 0.71  GLUCOSE 97 134*  CALCIUM 7.9* 8.3*   No results for input(s): LABPT, INR in the last 72 hours.  Physical Exam: On examination, the swelling and erythema of the right hand appear to be improved as compared to yesterday, although he still has 1-2+ swelling in the hand.  There is also less tenderness to palpation in the first webspace.  The area of more focal cellulitis with blistering over the dorsal aspect of the ring proximal phalanx is unchanged.  There appears to be some purulent material beneath it with a small area of punctate drainage.  Again, the patient is able to actively flex and extend all digits.  He is neurovascularly intact all digits.  X-rays: An MRI scan of the right hand from yesterday is available for review.  The findings are as described above.  There does not appear to be any evidence for deeper abscesses or osteomyelitis.  Assessment: Right hand cellulitis, slowly improving  clinically.  Plan: The treatment options are reviewed with the patient, including both surgical and nonsurgical options.  Given the area of blistering and apparent simultaneous necrosis over the dorsal aspect of the ring proximal phalanx, I recommended that we proceed with a formal irrigation debridement of this area.  The patient is comfortable with this decision.  The procedure has been discussed in detail, as have the potential risks (including bleeding, persistent/recurrent infection, need for further surgery, blood clots, strokes, heart attacks and arrhythmias, etc.) and benefits.  The patient states his understanding wishes to proceed.  A formal written consent will be obtained by the nursing staff.  Meanwhile, the patient will continue his present antibiotic regimen.   Excell Seltzer Zauria Dombek 08/25/2018, 10:52 AM

## 2018-08-25 NOTE — Op Note (Signed)
08/25/2018  3:43 PM  Patient:   Curtis Cobb  Pre-Op Diagnosis:   Dorsal right hand abscess.  Post-Op Diagnosis:   Same  Procedure:   Irrigation and debridement of a dorsal right hand abscess.  Surgeon:   Maryagnes Amos, MD  Assistant:   None  Anesthesia:   GET  Findings:   As above.  Complications:   None  Fluids:   500 cc crystalloid  EBL:   10 cc  UOP:   None  TT:   43 minutes at 250 mmHg  Drains:   PR x1  Closure:   4-0 Prolene interrupted sutures  Brief Clinical Note:   The patient is a 47 year old male with a several day history of increased pain, swelling, and erythema of his right hand following an apparent insect bite to the dorsal aspect of the right ring proximal phalanx region.  Despite 48 hours of IV antibiotics, the patient continues to have significant swelling with pain and erythema that has shown little improvement.  The area around the bite appears to be getting necrotic and is blistering.  The patient was at this time for formal irrigation and debridement of the dorsal right ring finger and hand abscess.  Procedure:   The patient was brought into the operating room and lain in the supine position.  After adequate general endotracheal intubation and anesthesia were obtained, the patient's right hand and upper extremity were prepped with ChloraPrep solution before being draped sterilely.  Preoperative antibiotics were administered.  A timeout was performed to verify the appropriate surgical site.  The hand was elevated for approximately 1 minute before the upper arm tourniquet was inflated to 250 mmHg.  The area of blistering around the punctate wound over the dorsal aspect of his right ring P1 region was unroofed and the area of necrotic subcutaneous tissues debrided sharply with a #15 blade.  Obvious purulent material was encountered.  A sample of this fluid was obtained and sent for culture and sensitivity.  The area was debrided using both a 15 blade and a  curette.  Upon further exploration, the infection was noted to track proximally into the dorsal aspect of the hand.  Therefore, the incision was extended for approximately 4 cm onto the dorsal aspect of the hand.  Additional purulent material was encountered and debrided thoroughly.  The area of abscess appeared to encompass the entire dorsum of the hand ranging from the index to the little metacarpals and extending approximately to the carpus.  In total, approximately 25 cc of pus was removed.  The remaining tissues were lightly abraded with a curette before being irrigated thoroughly with 1 L of antibiotic irrigation using a bulb syringe.  The proximal portion of the incision was reapproximated using 4-0 Prolene interrupted sutures, leaving the more distal portion open which included the area of the original punctate lesion.  A sterile bulky dressing was applied to the hand before the patient was awakened, extubated, and returned to the recovery room in satisfactory condition after tolerating the procedure well.

## 2018-08-25 NOTE — Transfer of Care (Signed)
Immediate Anesthesia Transfer of Care Note  Patient: Steward Drone  Procedure(s) Performed: Procedure(s): INCISION AND DRAINAGE ABSCESS (Right)  Patient Location: PACU  Anesthesia Type:General  Level of Consciousness: sedated  Airway & Oxygen Therapy: Patient Spontanous Breathing and Patient connected to face mask oxygen  Post-op Assessment: Report given to RN and Post -op Vital signs reviewed and stable  Post vital signs: Reviewed and stable  Last Vitals:  Vitals:   08/25/18 1356 08/25/18 1543  BP: 132/83 133/72  Pulse: 75 86  Resp: 18 19  Temp: (!) 35.7 C 36.8 C  SpO2: 98% 100%    Complications: No apparent anesthesia complications

## 2018-08-25 NOTE — Progress Notes (Signed)
Elevated hand on pillows and applied ice pack

## 2018-08-25 NOTE — Progress Notes (Signed)
[employer] [facility] - [place] [facility]   PATIENT NAME: Curtis Cobb    MR#:  [id number]  DATE OF BIRTH:  August 05, 1971  SUBJECTIVE:  CHIEF COMPLAINT:   Chief Complaint  Patient presents with  . Arm Swelling   -Improvement of swelling on the dorsum of the right hand, localization of a purulent spot noted.  Going for I&D today  REVIEW OF SYSTEMS:  Review of Systems  Constitutional: Negative for chills and fever.  HENT: Negative for congestion, ear discharge, hearing loss and nosebleeds.   Eyes: Negative for blurred vision and double vision.  Respiratory: Negative for cough, shortness of breath and wheezing.   Cardiovascular: Negative for chest pain and palpitations.  Gastrointestinal: Negative for abdominal pain, constipation, diarrhea, nausea and vomiting.  Genitourinary: Negative for dysuria and urgency.  Musculoskeletal: Positive for joint pain and myalgias.  Neurological: Negative for dizziness, focal weakness, seizures, weakness and headaches.  Psychiatric/Behavioral: Negative for depression.    DRUG ALLERGIES:   Allergies  Allergen Reactions  . Sulfa Antibiotics Other (See Comments)    Unknown reaction    VITALS:  Blood pressure 105/64, pulse 62, temperature 98.1 F (36.7 C), temperature source Oral, resp. rate 17, height 6\' 2"  (1.88 m), weight 88.7 kg, SpO2 99 %.  PHYSICAL EXAMINATION:  Physical Exam   GENERAL:  47 y.o.-year-old patient lying in the bed with no acute distress.  EYES: Pupils equal, round, reactive to light and accommodation. No scleral icterus. Extraocular muscles intact.  HEENT: Head atraumatic, normocephalic. Oropharynx and nasopharynx clear.  NECK:  Supple, no jugular venous distention. No thyroid enlargement, no tenderness.  LUNGS: Normal breath sounds bilaterally, no wheezing, rales,rhonchi or crepitation. No use of accessory muscles of respiration.  CARDIOVASCULAR: S1, S2 normal. No murmurs, rubs, or gallops.  ABDOMEN: Soft,  nontender, nondistended. Bowel sounds present. No organomegaly or mass.  EXTREMITIES: 2+ edema noted on the dorsum of the right hand with erythema surrounding and spreading onto the arm.  Increased redness and purulent spot noted at the base of the fourth digit on the dorsal side.  No pedal edema, cyanosis, or clubbing.  NEUROLOGIC: Cranial nerves II through XII are intact. Muscle strength 5/5 in all extremities. Sensation intact. Gait not checked.  PSYCHIATRIC: The patient is alert and oriented x 3.  SKIN: No obvious rash, lesion, or ulcer.    LABORATORY PANEL:   CBC Recent Labs  Lab 08/25/18 0338  WBC 21.5*  HGB 10.5*  HCT 31.0*  PLT 329   ------------------------------------------------------------------------------------------------------------------  Chemistries  Recent Labs  Lab 08/23/18 1116 08/24/18 0514 08/25/18 0338  NA 140 140 138  K 3.6 3.0* 4.3  CL 103 105 110  CO2 26 25 24   GLUCOSE 105* 97 134*  BUN 6 7 12   CREATININE 0.97 0.76 0.71  CALCIUM 8.7* 7.9* 8.3*  MG  --  1.9  --   AST 16  --   --   ALT 10  --   --   ALKPHOS 50  --   --   BILITOT 0.8  --   --    ------------------------------------------------------------------------------------------------------------------  Cardiac Enzymes No results for input(s): TROPONINI in the last 168 hours. ------------------------------------------------------------------------------------------------------------------  RADIOLOGY:  Mr Hand Right Wo Contrast  Result Date: 08/24/2018 CLINICAL DATA:  Swelling and aching of the right hand extending to the distal forearm. Evaluate for soft tissue abscess or evidence of necrotizing fasciitis. EXAM: MRI OF THE RIGHT HAND WITHOUT CONTRAST TECHNIQUE: Multiplanar, multisequence MR imaging of the right hand  was performed. No intravenous contrast was administered. COMPARISON:  08/23/2018 hand radiographs. FINDINGS: Bones/Joint/Cartilage No marrow signal abnormality indicative of  fracture, osteomyelitis or bone destruction. Degenerative cystic change or erosion of the third metacarpal head is identified along the radial aspect. Likewise, cystic change of the hamate is identified also likely degenerative in etiology. No focal chondral defects. Mild degenerative chondral thinning of the MCP articulations. No joint effusion. Ligaments Noncontributory Muscles and Tendons No intramuscular abscess nor apparent evidence of necrotizing fasciitis. There is tenosynovitis about the extensor tendon to the ring finger. Trace extensor tendon tenosynovitis more proximally. Soft tissues Diffuse soft tissue edema consistent with cellulitis over the dorsum of the hand. IMPRESSION: 1. Extensive soft tissue swelling of the included hand and wrist more so over the dorsum compatible with cellulitis. A component of tenosynovitis of the extensor tendons is also noted greatest along the ring finger. 2. Degenerative subcortical cystic change of the hamate and head of third metacarpal. No evidence of osteomyelitis, fracture or joint dislocation. Electronically Signed   By: Tollie Eth M.D.   On: 08/24/2018 21:52   Dg Hand Complete Right  Result Date: 08/23/2018 CLINICAL DATA:  Right hand swelling and redness. Symptoms began 2 days ago with sudden increase. EXAM: RIGHT HAND - COMPLETE 3+ VIEW COMPARISON:  None. FINDINGS: Extensive soft tissue swelling is present throughout hand. Degenerative changes are noted at the radiocarpal joint. Acute or focal osseous abnormality is present otherwise. No radiopaque foreign body is present. Soft tissue swelling extends to the level of the wrist. Most is agger is over the dorsum the hand. IMPRESSION: 1. Extensive soft tissue swelling without acute or focal associated osseous abnormality. Question cellulitis or trauma. 2. Degenerative changes at the radiocarpal joint. Electronically Signed   By: Marin Roberts M.D.   On: 08/23/2018 11:56    EKG:   Orders placed or  performed during the hospital encounter of 05/22/16  . EKG 12-Lead  . EKG 12-Lead  . ED EKG  . ED EKG  . EKG    ASSESSMENT AND PLAN:   47 year old male with past medical history significant for hypertension, rheumatoid arthritis and alcohol use presents to hospital secondary to right hand swelling and erythema.  1.  Right hand cellulitis-concern for poison ivy exposure as well. -Could be inflammatory reaction too. -continue on IV steroids.  on vancomycin and Unasyn. -negative blood cultures.  Appreciate orthopedic's consult.  -MRI of the hand showing cellulitis, no fluid collection or no osteomyelitis.  Patient going for I&D today.  Send the fluid for cultures and follow-up  -Continue to keep the arm elevated  2.  History of rheumatoid arthritis-currently on steroids.  Monitor.    3.  Hypokalemia-replaced  4.  Depression anxiety-continue Seroquel  5.  DVT prophylaxis-on Lovenox  6.  Alcohol abuse-no indication of any withdrawals.  Monitor     All the records are reviewed and case discussed with Care Management/Social Workerr. Management plans discussed with the patient, family and they are in agreement.  CODE STATUS: Full code  TOTAL TIME TAKING CARE OF THIS PATIENT: 36 minutes.   POSSIBLE D/C IN 1-2 DAYS, DEPENDING ON CLINICAL CONDITION.   Enid Baas M.D on 08/25/2018 at 8:53 AM  Between 7am to 6pm - Pager - [phone]  After 6pm go to [email or web address] - password [email or web address]  [facility]  Office  [phone]  CC: Primary care physician; Patient, No Pcp Per

## 2018-08-25 NOTE — H&P (Signed)
The patient apparently had a bite of a muffin earlier this morning despite being instructed to remain n.p.o.  However, I feel that the procedure is of sufficient urgency in order to protect the viability of the finger that it is reasonable to proceed with surgery at this time.  The procedure has been reviewed in detail with the patient again.

## 2018-08-25 NOTE — Progress Notes (Signed)
Pt oxygen sats low at times  Vital signs good  Visual pain scale 5 or 6 out of 10

## 2018-08-25 NOTE — Anesthesia Post-op Follow-up Note (Signed)
Anesthesia QCDR form completed.        

## 2018-08-25 NOTE — Progress Notes (Signed)
Pt A&OX4. In no acute distress. VSS. RUE elevated on pillows. Pain medication administered. Will continue to monitor.

## 2018-08-25 NOTE — OR Nursing (Signed)
Patient with vancomycin hanging upon arrival to [facility]. Chart reflects patient received vancomycin at 9 am today. Dr. Joice Lofts notified. I was instructed to D/c vancomycin that was hanging at this time. Vanc. D/c'd and unasyn hung as was due.

## 2018-08-25 NOTE — Anesthesia Procedure Notes (Addendum)
Procedure Name: Intubation Date/Time: 08/25/2018 2:37 PM Performed by: Deri Fuelling, CRNA Pre-anesthesia Checklist: Patient identified, Emergency Drugs available, Suction available, Patient being monitored and Timeout performed Patient Re-evaluated:Patient Re-evaluated prior to induction Oxygen Delivery Method: Circle system utilized Preoxygenation: Pre-oxygenation with 100% oxygen Induction Type: IV induction Laryngoscope Size: McGraph and 3 Grade View: Grade II Tube type: Oral Tube size: 7.5 mm Number of attempts: 1 Airway Equipment and Method: Stylet and Video-laryngoscopy Placement Confirmation: ETT inserted through vocal cords under direct vision,  positive ETCO2,  CO2 detector and breath sounds checked- equal and bilateral (circoid pressure maintained until tube was secure, eto2 and chest rise and fall confirmed) Secured at: 22 cm Tube secured with: Tape Dental Injury: Teeth and Oropharynx as per pre-operative assessment

## 2018-08-25 NOTE — Plan of Care (Signed)
  Problem: Education: Goal: Knowledge of General Education information will improve Description: Including pain rating scale, medication(s)/side effects and non-pharmacologic comfort measures Outcome: Progressing   Problem: Pain Managment: Goal: General experience of comfort will improve Outcome: Progressing   Problem: Skin Integrity: Goal: Risk for impaired skin integrity will decrease Outcome: Progressing   

## 2018-08-26 ENCOUNTER — Encounter: Payer: Self-pay | Admitting: Surgery

## 2018-08-26 LAB — CBC
HCT: 31.8 % — ABNORMAL LOW (ref 40.0–52.0)
HEMOGLOBIN: 10.7 g/dL — AB (ref 13.0–18.0)
MCH: 29.6 pg (ref 26.0–34.0)
MCHC: 33.5 g/dL (ref 32.0–36.0)
MCV: 88.5 fL (ref 80.0–100.0)
PLATELETS: 342 10*3/uL (ref 150–440)
RBC: 3.6 MIL/uL — AB (ref 4.40–5.90)
RDW: 14.1 % (ref 11.5–14.5)
WBC: 18.2 10*3/uL — AB (ref 3.8–10.6)

## 2018-08-26 LAB — BASIC METABOLIC PANEL
ANION GAP: 4 — AB (ref 5–15)
BUN: 9 mg/dL (ref 6–20)
CHLORIDE: 111 mmol/L (ref 98–111)
CO2: 26 mmol/L (ref 22–32)
Calcium: 8.1 mg/dL — ABNORMAL LOW (ref 8.9–10.3)
Creatinine, Ser: 0.67 mg/dL (ref 0.61–1.24)
GFR calc Af Amer: >60 mL/min (ref >60)
GLUCOSE: 129 mg/dL — AB (ref 70–99)
Potassium: 4.6 mmol/L (ref 3.5–5.1)
SODIUM: 141 mmol/L (ref 135–145)

## 2018-08-26 LAB — VANCOMYCIN, TROUGH: VANCOMYCIN TR: 11 ug/mL — AB (ref 15–20)

## 2018-08-26 MED ORDER — VANCOMYCIN HCL 10 G IV SOLR
1750.0000 mg | Freq: Two times a day (BID) | INTRAVENOUS | Status: DC
  Administered 2018-08-26 – 2018-08-31 (×10): 1750 mg via INTRAVENOUS
  Filled 2018-08-26 (×2): qty 1750
  Filled 2018-08-26: qty 1000
  Filled 2018-08-26 (×9): qty 1750

## 2018-08-26 MED ORDER — VANCOMYCIN HCL 10 G IV SOLR: 1750.0000 mg | Freq: Two times a day (BID) | INTRAVENOUS | Status: DC

## 2018-08-26 MED ORDER — VANCOMYCIN HCL 10 G IV SOLR
1500.0000 mg | Freq: Once | INTRAVENOUS | Status: AC
  Administered 2018-08-26: 1500 mg via INTRAVENOUS
  Filled 2018-08-26: qty 1500

## 2018-08-26 NOTE — Plan of Care (Signed)
  Problem: Pain Managment: Goal: General experience of comfort will improve Outcome: Progressing   Problem: Skin Integrity: Goal: Risk for impaired skin integrity will decrease Outcome: Progressing   

## 2018-08-26 NOTE — Progress Notes (Signed)
  Subjective: 1 Day Post-Op Procedure(s) (LRB): INCISION AND DRAINAGE ABSCESS (Right) Patient reports pain as 6 on 0-10 scale.   Patient is well, and has had no acute complaints or problems Plan is to go Home after hospital stay. Negative for chest pain and shortness of breath Fever: No recent fevers, WBC decreased to 18 this AM. Gastrointestinal:Negative for nausea and vomiting  Objective: Vital signs in last 24 hours: Temp:  [96.2 F (35.7 C)-99.2 F (37.3 C)] 97.6 F (36.4 C) (08/30 0809) Pulse Rate:  [45-86] 46 (08/30 0809) Resp:  [12-20] 18 (08/30 0809) BP: (110-142)/(57-94) 118/82 (08/30 0809) SpO2:  [96 %-100 %] 100 % (08/30 0809)  Intake/Output from previous day:  Intake/Output Summary (Last 24 hours) at 08/26/2018 1054 Last data filed at 08/26/2018 0554 Gross per 24 hour  Intake 5405.47 ml  Output -  Net 5405.47 ml    Intake/Output this shift: No intake/output data recorded.  Labs: Recent Labs    08/23/18 1116 08/24/18 0514 08/25/18 0338 08/26/18 0430  HGB 12.9* 11.1* 10.5* 10.7*   Recent Labs    08/25/18 0338 08/26/18 0430  WBC 21.5* 18.2*  RBC 3.52* 3.60*  HCT 31.0* 31.8*  PLT 329 342   Recent Labs    08/25/18 0338 08/26/18 0430  NA 138 141  K 4.3 4.6  CL 110 111  CO2 24 26  BUN 12 9  CREATININE 0.71 0.67  GLUCOSE 134* 129*  CALCIUM 8.3* 8.1*   No results for input(s): LABPT, INR in the last 72 hours.   EXAM General - Patient is Alert, Appropriate and Oriented Extremity - Bulky dressing was removed from the right hand today, purulent drainage noted, mild erythema.  The primrose drain was pulled out approx. 3 cm and was cut to shorten the length outside of the skin.  A new bulky dressing and ACE wrap was applied. Intact to light touch to the right hand, cap refill intact to each finger.  Pulses are intact. Dressing/Incision - New Bulky dressing applied to the right arm today. Motor Function - intact, moving foot and toes well on exam.   Abdomen is soft with normal BS.  Past Medical History:  Diagnosis Date  . Alcohol use disorder, moderate, dependence (HCC) 02/13/2014  . Anxiety   . Depression   . Hypertension   . Rheumatoid arthritis (HCC)   . Substance abuse (HCC)     Assessment/Plan: 1 Day Post-Op Procedure(s) (LRB): INCISION AND DRAINAGE ABSCESS (Right) Active Problems:   Cellulitis  Estimated body mass index is 25.11 kg/m as calculated from the following:   Height as of this encounter: 6\' 2"  (1.88 m).   Weight as of this encounter: 88.7 kg. Advance diet D/C IV fluids  Labs reviewed today.  WBC 18.2 this morning. Dressing change done today, will advance drain more tomorrow and remove it on Sunday most likely. Continue Abx at this time.  DVT Prophylaxis - Lovenox, Foot Pumps and TED hose Minimal weightbearing to the right hand.  Thursday, PA-C [facility] 08/26/2018, 10:54 AM

## 2018-08-26 NOTE — Progress Notes (Addendum)
[facility] [place] [facility]   PATIENT NAME: Curtis Cobb    MR#:  [id number]  DATE OF BIRTH:  04-24-1971  SUBJECTIVE:  CHIEF COMPLAINT:   Chief Complaint  Patient presents with  . Arm Swelling   -Status post I&D of the right hand yesterday.  Purulent material removed and sent for cultures yesterday.  REVIEW OF SYSTEMS:  Review of Systems  Constitutional: Negative for chills and fever.  HENT: Negative for congestion, ear discharge, hearing loss and nosebleeds.   Eyes: Negative for blurred vision and double vision.  Respiratory: Negative for cough, shortness of breath and wheezing.   Cardiovascular: Negative for chest pain and palpitations.  Gastrointestinal: Negative for abdominal pain, constipation, diarrhea, nausea and vomiting.  Genitourinary: Negative for dysuria and urgency.  Musculoskeletal: Positive for joint pain and myalgias.  Neurological: Negative for dizziness, focal weakness, seizures, weakness and headaches.  Psychiatric/Behavioral: Negative for depression.    DRUG ALLERGIES:   Allergies  Allergen Reactions  . Sulfa Antibiotics Other (See Comments)    Unknown reaction    VITALS:  Blood pressure 118/82, pulse (!) 46, temperature 97.6 F (36.4 C), temperature source Oral, resp. rate 18, height 6\' 2"  (1.88 m), weight 88.7 kg, SpO2 100 %.  PHYSICAL EXAMINATION:  Physical Exam   GENERAL:  47 y.o.-year-old patient lying in the bed with no acute distress.  EYES: Pupils equal, round, reactive to light and accommodation. No scleral icterus. Extraocular muscles intact.  HEENT: Head atraumatic, normocephalic. Oropharynx and nasopharynx clear.  NECK:  Supple, no jugular venous distention. No thyroid enlargement, no tenderness.  LUNGS: Normal breath sounds bilaterally, no wheezing, rales,rhonchi or crepitation. No use of accessory muscles of respiration.  CARDIOVASCULAR: S1, S2 normal. No murmurs, rubs, or gallops.  ABDOMEN: Soft, nontender,  nondistended. Bowel sounds present. No organomegaly or mass.  EXTREMITIES: Status post I&D of the right hand, dressing in place this morning   No pedal edema, cyanosis, or clubbing.  NEUROLOGIC: Cranial nerves II through XII are intact. Muscle strength 5/5 in all extremities. Sensation intact. Gait not checked.  PSYCHIATRIC: The patient is alert and oriented x 3.  SKIN: No obvious rash, lesion, or ulcer.    LABORATORY PANEL:   CBC Recent Labs  Lab 08/26/18 0430  WBC 18.2*  HGB 10.7*  HCT 31.8*  PLT 342   ------------------------------------------------------------------------------------------------------------------  Chemistries  Recent Labs  Lab 08/23/18 1116 08/24/18 0514  08/26/18 0430  NA 140 140   < > 141  K 3.6 3.0*   < > 4.6  CL 103 105   < > 111  CO2 26 25   < > 26  GLUCOSE 105* 97   < > 129*  BUN 6 7   < > 9  CREATININE 0.97 0.76   < > 0.67  CALCIUM 8.7* 7.9*   < > 8.1*  MG  --  1.9  --   --   AST 16  --   --   --   ALT 10  --   --   --   ALKPHOS 50  --   --   --   BILITOT 0.8  --   --   --    < > = values in this interval not displayed.   ------------------------------------------------------------------------------------------------------------------  Cardiac Enzymes No results for input(s): TROPONINI in the last 168 hours. ------------------------------------------------------------------------------------------------------------------  RADIOLOGY:  Mr Hand Right Wo Contrast  Result Date: 08/24/2018 CLINICAL DATA:  Swelling and aching of the right  hand extending to the distal forearm. Evaluate for soft tissue abscess or evidence of necrotizing fasciitis. EXAM: MRI OF THE RIGHT HAND WITHOUT CONTRAST TECHNIQUE: Multiplanar, multisequence MR imaging of the right hand was performed. No intravenous contrast was administered. COMPARISON:  08/23/2018 hand radiographs. FINDINGS: Bones/Joint/Cartilage No marrow signal abnormality indicative of fracture,  osteomyelitis or bone destruction. Degenerative cystic change or erosion of the third metacarpal head is identified along the radial aspect. Likewise, cystic change of the hamate is identified also likely degenerative in etiology. No focal chondral defects. Mild degenerative chondral thinning of the MCP articulations. No joint effusion. Ligaments Noncontributory Muscles and Tendons No intramuscular abscess nor apparent evidence of necrotizing fasciitis. There is tenosynovitis about the extensor tendon to the ring finger. Trace extensor tendon tenosynovitis more proximally. Soft tissues Diffuse soft tissue edema consistent with cellulitis over the dorsum of the hand. IMPRESSION: 1. Extensive soft tissue swelling of the included hand and wrist more so over the dorsum compatible with cellulitis. A component of tenosynovitis of the extensor tendons is also noted greatest along the ring finger. 2. Degenerative subcortical cystic change of the hamate and head of third metacarpal. No evidence of osteomyelitis, fracture or joint dislocation. Electronically Signed   By: Tollie Eth M.D.   On: 08/24/2018 21:52    EKG:   Orders placed or performed during the hospital encounter of 05/22/16  . EKG 12-Lead  . EKG 12-Lead  . ED EKG  . ED EKG  . EKG    ASSESSMENT AND PLAN:   47 year old male with past medical history significant for hypertension, rheumatoid arthritis and alcohol use presents to hospital secondary to right hand swelling and erythema.  1.  Right hand cellulitis-appreciate orthopedics consult. -Received steroids and IV antibiotics.  Status post I&D now purulent material sent for cultures. -Cultures growing gram-positive cocci.  Discontinue steroids and Unasyn -Drain is present. -negative blood cultures.  Appreciate orthopedic's consult.  -MRI of the hand showing cellulitis, no fluid collection or no osteomyelitis.  -Continue to keep the arm elevated -Steroids could also be worsening the  leukocytosis.  2.  History of rheumatoid arthritis-off steroids.  Stable. Monitor.    3.  Hypokalemia-replaced  4.  Depression anxiety-continue Seroquel  5.  DVT prophylaxis-on Lovenox  6.  Alcohol abuse-no indication of any withdrawals.  Monitor   Discharge tomorrow likely  All the records are reviewed and case discussed with Care Management/Social Workerr. Management plans discussed with the patient, family and they are in agreement.  CODE STATUS: Full code  TOTAL TIME TAKING CARE OF THIS PATIENT: 36 minutes.   POSSIBLE D/C IN 1-2 DAYS, DEPENDING ON CLINICAL CONDITION.   Dietrick Barris M.D on 08/26/2018 at 12:26 PM  Between 7am to 6pm - Pager - [phone]  After 6pm go to [email or web address] - password [employer]  [facility]  Office  [phone]  CC: Primary care physician; Patient, No Pcp Per

## 2018-08-26 NOTE — Progress Notes (Signed)
Pharmacy Antibiotic Note  Curtis Cobb is a 47 y.o. male admitted on 08/23/2018 with cellulitis.  Pharmacy has been consulted for Vancomycin and Unasyn dosing.  Plan: Vancomycin 1500 IV every 12 hours.  Goal trough 10-15 mcg/mL. Unasyn 3g IV q6h  Originally, Vancomycin trough was scheduled prior to 5th dose, 0829 @ 1930. However, Vancomycin was incorrectly infused at ~2pm yesterday after the 9 am dose.  0829 VT 11 @ 0733. Will increase Vancomycin to 1750 g every 12 hours. Will check next level on 0901 @ 0930.  Kinetics Ke 0.087 Vd 62.09 T1/2 8 hrs Cmin 16.45  Height: 6\' 2"  (188 cm) Weight: 195 lb 9.6 oz (88.7 kg) IBW/kg (Calculated) : 82.2  Temp (24hrs), Avg:98.1 F (36.7 C), Min:96.2 F (35.7 C), Max:99.2 F (37.3 C)  Recent Labs  Lab 08/23/18 1116 08/23/18 1333 08/23/18 2056 08/24/18 0514 08/25/18 0338 08/26/18 0430 08/26/18 0733  WBC 21.6*  --   --  20.1* 21.5* 18.2*  --   CREATININE 0.97  --   --  0.76 0.71 0.67  --   LATICACIDVEN  --  1.4 1.6  --   --   --   --   VANCOTROUGH  --   --   --   --   --   --  11*    Estimated Creatinine Clearance: 137 mL/min (by C-G formula based on SCr of 0.67 mg/dL).    Allergies  Allergen Reactions  . Sulfa Antibiotics Other (See Comments)    Unknown reaction    Antimicrobials this admission: Cefazolin 8/27 x 1  Unasyn 8/27 >>  Vancomycin 8/27 >>  Thank you for allowing pharmacy to be a part of this patient's care.   9/27, PharmD Pharmacy Resident  08/26/2018 7:59 AM

## 2018-08-26 NOTE — Anesthesia Postprocedure Evaluation (Signed)
Anesthesia Post Note  Patient: Curtis Cobb  Procedure(s) Performed: INCISION AND DRAINAGE ABSCESS (Right )  Patient location during evaluation: PACU Anesthesia Type: General Level of consciousness: awake and alert Pain management: pain level controlled Vital Signs Assessment: post-procedure vital signs reviewed and stable Respiratory status: spontaneous breathing, nonlabored ventilation, respiratory function stable and patient connected to nasal cannula oxygen Cardiovascular status: blood pressure returned to baseline and stable Postop Assessment: no apparent nausea or vomiting Anesthetic complications: no     Last Vitals:  Vitals:   08/26/18 0401 08/26/18 0402  BP: 114/74   Pulse: (!) 45 (!) 52  Resp: 19   Temp: 36.6 C   SpO2: 96%     Last Pain:  Vitals:   08/26/18 0551  TempSrc:   PainSc: 5                  Cleda Mccreedy Benecio Kluger

## 2018-08-27 LAB — CBC
HCT: 31.3 % — ABNORMAL LOW (ref 40.0–52.0)
HEMOGLOBIN: 10.6 g/dL — AB (ref 13.0–18.0)
MCH: 29.7 pg (ref 26.0–34.0)
MCHC: 33.7 g/dL (ref 32.0–36.0)
MCV: 88.1 fL (ref 80.0–100.0)
PLATELETS: 376 10*3/uL (ref 150–440)
RBC: 3.55 MIL/uL — AB (ref 4.40–5.90)
RDW: 14.5 % (ref 11.5–14.5)
WBC: 14.4 10*3/uL — AB (ref 3.8–10.6)

## 2018-08-27 NOTE — Progress Notes (Signed)
   Subjective: 2 Days Post-Op Procedure(s) (LRB): INCISION AND DRAINAGE ABSCESS (Right) Patient reports pain as mild. Pain and swelling improving each day Patient is well, and has had no acute complaints or problems Denies any fevers CP, SOB, ABD pain.  Objective: Vital signs in last 24 hours: Temp:  [97.4 F (36.3 C)-98.4 F (36.9 C)] 98.3 F (36.8 C) (08/31 0516) Pulse Rate:  [44-62] 44 (08/31 0516) Resp:  [16-20] 16 (08/31 0516) BP: (131-146)/(82-86) 131/85 (08/31 0516) SpO2:  [97 %-99 %] 97 % (08/31 0516)  Intake/Output from previous day: 08/30 0701 - 08/31 0700 In: 1317.5 [P.O.:480; I.V.:158.1; IV Piggyback:679.4] Out: -  Intake/Output this shift: No intake/output data recorded.  Recent Labs    08/25/18 0338 08/26/18 0430 08/27/18 0420  HGB 10.5* 10.7* 10.6*   Recent Labs    08/26/18 0430 08/27/18 0420  WBC 18.2* 14.4*  RBC 3.60* 3.55*  HCT 31.8* 31.3*  PLT 342 376   Recent Labs    08/25/18 0338 08/26/18 0430  NA 138 141  K 4.3 4.6  CL 110 111  CO2 24 26  BUN 12 9  CREATININE 0.71 0.67  GLUCOSE 134* 129*  CALCIUM 8.3* 8.1*   No results for input(s): LABPT, INR in the last 72 hours.  EXAM General - Patient is Alert, Appropriate and Oriented Extremity - Neurovascular intact Sensation intact distally Intact pulses distally No cellulitis present Compartment soft  Close to making a fist  Minimal erythema base of dorsal aspect of ring finger. Pin rose removed completely. 1x1 cm opening along dorsal aspect of right ring finger is present with minimal purulent drainage present. No fluctuance present. Patient able to maintain full active extension. NVI RUE Dressing - dressing C/D/I, changed this am Motor Function - intact, moving digits well on exam.   Past Medical History:  Diagnosis Date  . Alcohol use disorder, moderate, dependence (HCC) 02/13/2014  . Anxiety   . Depression   . Hypertension   . Rheumatoid arthritis (HCC)   . Substance abuse  (HCC)     Assessment/Plan:   2 Days Post-Op Procedure(s) (LRB): INCISION AND DRAINAGE ABSCESS (Right) Active Problems:   Cellulitis  Estimated body mass index is 25.11 kg/m as calculated from the following:   Height as of this encounter: 6\' 2"  (1.88 m).   Weight as of this encounter: 88.7 kg. Continue with IV abx, cultures growing gram positive cocci Plan on discharge to home with PO abx tomorrow Follow up with [facility] in 7 days for wound  Check Keep Hand clean and dry   T. , PA-C [facility] 08/27/2018, 8:58 AM

## 2018-08-27 NOTE — Plan of Care (Signed)
  Problem: Pain Managment: Goal: General experience of comfort will improve Outcome: Progressing   Problem: Skin Integrity: Goal: Risk for impaired skin integrity will decrease Outcome: Progressing   

## 2018-08-27 NOTE — Progress Notes (Addendum)
[facility] [place] at [facility]   PATIENT NAME: Curtis Cobb    MR#:  [id number]  DATE OF BIRTH:  1971-07-21  SUBJECTIVE:  CHIEF COMPLAINT:   Chief Complaint  Patient presents with  . Arm Swelling   -Drain removed from the dorsum of the hand today.  Still with some purulent discharge.  WBC is improving.  Still with significant pain  REVIEW OF SYSTEMS:  Review of Systems  Constitutional: Negative for chills and fever.  HENT: Negative for congestion, ear discharge, hearing loss and nosebleeds.   Eyes: Negative for blurred vision and double vision.  Respiratory: Negative for cough, shortness of breath and wheezing.   Cardiovascular: Negative for chest pain and palpitations.  Gastrointestinal: Negative for abdominal pain, constipation, diarrhea, nausea and vomiting.  Genitourinary: Negative for dysuria and urgency.  Musculoskeletal: Positive for joint pain and myalgias.  Neurological: Negative for dizziness, focal weakness, seizures, weakness and headaches.  Psychiatric/Behavioral: Negative for depression.    DRUG ALLERGIES:   Allergies  Allergen Reactions  . Sulfa Antibiotics Other (See Comments)    Unknown reaction    VITALS:  Blood pressure 131/85, pulse (!) 44, temperature 98.3 F (36.8 C), temperature source Oral, resp. rate 16, height 6\' 2"  (1.88 m), weight 88.7 kg, SpO2 97 %.  PHYSICAL EXAMINATION:  Physical Exam   GENERAL:  47 y.o.-year-old patient lying in the bed with no acute distress.  EYES: Pupils equal, round, reactive to light and accommodation. No scleral icterus. Extraocular muscles intact.  HEENT: Head atraumatic, normocephalic. Oropharynx and nasopharynx clear.  NECK:  Supple, no jugular venous distention. No thyroid enlargement, no tenderness.  LUNGS: Normal breath sounds bilaterally, no wheezing, rales,rhonchi or crepitation. No use of accessory muscles of respiration.  CARDIOVASCULAR: S1, S2 normal. No murmurs, rubs, or gallops.   ABDOMEN: Soft, nontender, nondistended. Bowel sounds present. No organomegaly or mass.  EXTREMITIES: Status post I&D of the right hand, drain in place-open wound.  Swelling is much improved. No pedal edema, cyanosis, or clubbing.  NEUROLOGIC: Cranial nerves II through XII are intact. Muscle strength 5/5 in all extremities. Sensation intact. Gait not checked.  PSYCHIATRIC: The patient is alert and oriented x 3.  SKIN: No obvious rash, lesion, or ulcer.    LABORATORY PANEL:   CBC Recent Labs  Lab 08/27/18 0420  WBC 14.4*  HGB 10.6*  HCT 31.3*  PLT 376   ------------------------------------------------------------------------------------------------------------------  Chemistries  Recent Labs  Lab 08/23/18 1116 08/24/18 0514  08/26/18 0430  NA 140 140   < > 141  K 3.6 3.0*   < > 4.6  CL 103 105   < > 111  CO2 26 25   < > 26  GLUCOSE 105* 97   < > 129*  BUN 6 7   < > 9  CREATININE 0.97 0.76   < > 0.67  CALCIUM 8.7* 7.9*   < > 8.1*  MG  --  1.9  --   --   AST 16  --   --   --   ALT 10  --   --   --   ALKPHOS 50  --   --   --   BILITOT 0.8  --   --   --    < > = values in this interval not displayed.   ------------------------------------------------------------------------------------------------------------------  Cardiac Enzymes No results for input(s): TROPONINI in the last 168 hours. ------------------------------------------------------------------------------------------------------------------  RADIOLOGY:  No results found.  EKG:   Orders placed  or performed during the hospital encounter of 05/22/16  . EKG 12-Lead  . EKG 12-Lead  . ED EKG  . ED EKG  . EKG    ASSESSMENT AND PLAN:   47 year old male with past medical history significant for hypertension, rheumatoid arthritis and alcohol use presents to hospital secondary to right hand swelling and erythema.  1.  Right hand cellulitis-appreciate orthopedics consult. -Received steroids and IV  antibiotics.  Status post I&D now purulent material sent for cultures. -Cultures growing gram-positive cocci.  Discontinued steroids.  - On vancomycin -Drain is removed, still has some purulent material draining out. -negative blood cultures.  Appreciate orthopedic's consult.  -MRI of the hand showing cellulitis, no fluid collection or no osteomyelitis.  -Continue to keep the arm elevated -improving leukocytosis.  2.  History of rheumatoid arthritis-off steroids.  Stable. Monitor.    3.  Hypokalemia-replaced  4.  Depression anxiety-continue Seroquel  5.  DVT prophylaxis-on Lovenox  6.  Alcohol abuse-no indication of any withdrawals.  Monitor   Discharge tomorrow likely  All the records are reviewed and case discussed with Care Management/Social Workerr. Management plans discussed with the patient, family and they are in agreement.  CODE STATUS: Full code  TOTAL TIME TAKING CARE OF THIS PATIENT: 36 minutes.   POSSIBLE D/C TOMORROW, DEPENDING ON CLINICAL CONDITION.   Enid Baas M.D on 08/27/2018 at 9:31 AM  Between 7am to 6pm - Pager - [phone]  After 6pm go to [email or web address] - password [employer]  [facility]  Office  [phone]  CC: Primary care physician; Patient, No Pcp Per

## 2018-08-28 LAB — CULTURE, BLOOD (ROUTINE X 2)
Culture: NO GROWTH
Culture: NO GROWTH
Special Requests: ADEQUATE

## 2018-08-28 LAB — CBC
HEMATOCRIT: 36.1 % — AB (ref 40.0–52.0)
HEMOGLOBIN: 12.2 g/dL — AB (ref 13.0–18.0)
MCH: 29.8 pg (ref 26.0–34.0)
MCHC: 33.7 g/dL (ref 32.0–36.0)
MCV: 88.2 fL (ref 80.0–100.0)
Platelets: 413 10*3/uL (ref 150–440)
RBC: 4.09 MIL/uL — ABNORMAL LOW (ref 4.40–5.90)
RDW: 14 % (ref 11.5–14.5)
WBC: 12.1 10*3/uL — ABNORMAL HIGH (ref 3.8–10.6)

## 2018-08-28 LAB — BASIC METABOLIC PANEL
Anion gap: 9 (ref 5–15)
BUN: 12 mg/dL (ref 6–20)
CHLORIDE: 104 mmol/L (ref 98–111)
CO2: 28 mmol/L (ref 22–32)
CREATININE: 0.88 mg/dL (ref 0.61–1.24)
Calcium: 8.5 mg/dL — ABNORMAL LOW (ref 8.9–10.3)
GFR calc non Af Amer: >60 mL/min (ref >60)
GLUCOSE: 81 mg/dL (ref 70–99)
Potassium: 3.7 mmol/L (ref 3.5–5.1)
Sodium: 141 mmol/L (ref 135–145)

## 2018-08-28 LAB — VANCOMYCIN, TROUGH: Vancomycin Tr: 15 ug/mL (ref 15–20)

## 2018-08-28 NOTE — Progress Notes (Signed)
[facility] [place] at [employer] [facility]   PATIENT NAME: Curtis Cobb    MR#:  [id number]  DATE OF BIRTH:  [birth year]  SUBJECTIVE:  CHIEF COMPLAINT:   Chief Complaint  Patient presents with  . Arm Swelling   -Swelling is much improved.  Drain is removed yesterday.  WBC is improving.  Still there is redness and purulent discharge on the right hand fourth finger  REVIEW OF SYSTEMS:  Review of Systems  Constitutional: Negative for chills and fever.  HENT: Negative for congestion, ear discharge, hearing loss and nosebleeds.   Eyes: Negative for blurred vision and double vision.  Respiratory: Negative for cough, shortness of breath and wheezing.   Cardiovascular: Negative for chest pain and palpitations.  Gastrointestinal: Negative for abdominal pain, constipation, diarrhea, nausea and vomiting.  Genitourinary: Negative for dysuria and urgency.  Musculoskeletal: Positive for joint pain and myalgias.  Neurological: Negative for dizziness, focal weakness, seizures, weakness and headaches.  Psychiatric/Behavioral: Negative for depression.    DRUG ALLERGIES:   Allergies  Allergen Reactions  . Sulfa Antibiotics Other (See Comments)    Unknown reaction    VITALS:  Blood pressure 130/75, pulse 63, temperature 98 F (36.7 C), temperature source Oral, resp. rate 13, height 6\' 2"  (1.88 m), weight 88.7 kg, SpO2 96 %.  PHYSICAL EXAMINATION:  Physical Exam   GENERAL:  47 y.o.-year-old patient lying in the bed with no acute distress.  EYES: Pupils equal, round, reactive to light and accommodation. No scleral icterus. Extraocular muscles intact.  HEENT: Head atraumatic, normocephalic. Oropharynx and nasopharynx clear.  NECK:  Supple, no jugular venous distention. No thyroid enlargement, no tenderness.  LUNGS: Normal breath sounds bilaterally, no wheezing, rales,rhonchi or crepitation. No use of accessory muscles of respiration.  CARDIOVASCULAR: S1, S2 normal. No murmurs,  rubs, or gallops.  ABDOMEN: Soft, nontender, nondistended. Bowel sounds present. No organomegaly or mass.  EXTREMITIES: Status post I&D of the right hand, drain is removed.  Open wound indurated with purulent material on it.  No pedal edema, cyanosis, or clubbing.  NEUROLOGIC: Cranial nerves II through XII are intact. Muscle strength 5/5 in all extremities. Sensation intact. Gait not checked.  PSYCHIATRIC: The patient is alert and oriented x 3.  SKIN: No obvious rash, lesion, or ulcer.    LABORATORY PANEL:   CBC Recent Labs  Lab 08/28/18 0354  WBC 12.1*  HGB 12.2*  HCT 36.1*  PLT 413   ------------------------------------------------------------------------------------------------------------------  Chemistries  Recent Labs  Lab 08/23/18 1116 08/24/18 0514  08/28/18 0354  NA 140 140   < > 141  K 3.6 3.0*   < > 3.7  CL 103 105   < > 104  CO2 26 25   < > 28  GLUCOSE 105* 97   < > 81  BUN 6 7   < > 12  CREATININE 0.97 0.76   < > 0.88  CALCIUM 8.7* 7.9*   < > 8.5*  MG  --  1.9  --   --   AST 16  --   --   --   ALT 10  --   --   --   ALKPHOS 50  --   --   --   BILITOT 0.8  --   --   --    < > = values in this interval not displayed.   ------------------------------------------------------------------------------------------------------------------  Cardiac Enzymes No results for input(s): TROPONINI in the last 168 hours. ------------------------------------------------------------------------------------------------------------------  RADIOLOGY:  No results found.  EKG:   Orders placed or performed during the hospital encounter of 05/22/16  . EKG 12-Lead  . EKG 12-Lead  . ED EKG  . ED EKG  . EKG    ASSESSMENT AND PLAN:   47 year old male with past medical history significant for hypertension, rheumatoid arthritis and alcohol use presents to hospital secondary to right hand swelling and erythema.  1.  Right hand cellulitis-appreciate orthopedics  consult. -on IV antibiotics.  Off steroids - Status post I&D now purulent material sent for cultures. -Cultures growing gram-positive cocci.   - On vancomycin -Drain is removed, still has some purulent material draining out. -negative blood cultures.  Appreciate orthopedic's consult.  -MRI of the hand showing cellulitis, no fluid collection or no osteomyelitis.  -Continue to keep the arm elevated -improving leukocytosis. -Plan is to check with orthopedics to see if patient needs any further debridement  2.  History of rheumatoid arthritis-off steroids.  Stable. Monitor.    3.  Hypokalemia-replaced  4.  Depression anxiety-continue Seroquel  5.  DVT prophylaxis-on Lovenox  6.  Alcohol abuse-no indication of any withdrawals.  Monitor   Discharge tomorrow likely  All the records are reviewed and case discussed with Care Management/Social Workerr. Management plans discussed with the patient, family and they are in agreement.  CODE STATUS: Full code  TOTAL TIME TAKING CARE OF THIS PATIENT: 36 minutes.   POSSIBLE D/C TOMORROW, DEPENDING ON CLINICAL CONDITION.   Enid Baas M.D on 08/28/2018 at 11:48 AM  Between 7am to 6pm - Pager - [phone]  After 6pm go to [email or web address] - password [email or web address]  [facility]  Office  [phone]  CC: Primary care physician; Patient, No Pcp Per

## 2018-08-28 NOTE — Plan of Care (Signed)
  Problem: Pain Managment: Goal: General experience of comfort will improve 08/28/2018 2038 by Donnel Saxon, RN Outcome: Progressing 08/28/2018 2035 by Donnel Saxon, RN Outcome: Progressing   Problem: Skin Integrity: Goal: Risk for impaired skin integrity will decrease Outcome: Progressing

## 2018-08-28 NOTE — Progress Notes (Signed)
   Subjective: 3 Days Post-Op Procedure(s) (LRB): INCISION AND DRAINAGE ABSCESS (Right) Patient reports pain as mild. Pain and swelling the same over last 24 hours.  Patient is well, and has had no acute complaints or problems Denies any fevers CP, SOB, ABD pain.  Objective: Vital signs in last 24 hours: Temp:  [97.8 F (36.6 C)-98.2 F (36.8 C)] 98 F (36.7 C) (09/01 0406) Pulse Rate:  [60-66] 63 (09/01 0406) Resp:  [13-16] 13 (09/01 0406) BP: (130-143)/(75-96) 130/75 (09/01 0406) SpO2:  [94 %-97 %] 96 % (09/01 0406)  Intake/Output from previous day: 08/31 0701 - 09/01 0700 In: 1680 [P.O.:1180; IV Piggyback:500] Out: -  Intake/Output this shift: No intake/output data recorded.  Recent Labs    08/26/18 0430 08/27/18 0420 08/28/18 0354  HGB 10.7* 10.6* 12.2*   Recent Labs    08/27/18 0420 08/28/18 0354  WBC 14.4* 12.1*  RBC 3.55* 4.09*  HCT 31.3* 36.1*  PLT 376 413   Recent Labs    08/26/18 0430 08/28/18 0354  NA 141 141  K 4.6 3.7  CL 111 104  CO2 26 28  BUN 9 12  CREATININE 0.67 0.88  GLUCOSE 129* 81  CALCIUM 8.1* 8.5*   No results for input(s): LABPT, INR in the last 72 hours.  EXAM General - Patient is Alert, Appropriate and Oriented Extremity - Neurovascular intact Sensation intact distally Intact pulses distally No cellulitis present Compartment soft  Close to making a full fist  Minimal erythema base of dorsal aspect of ring finger. 1x1 cm opening along dorsal aspect of right ring finger is present with minimal purulent drainage present. Less drainage today than yesterday. No fluctuance present. Patient able to maintain full active extension. NVI RUE Dressing - dressing C/D/I, changed this am Motor Function - intact, moving digits well on exam.   Past Medical History:  Diagnosis Date  . Alcohol use disorder, moderate, dependence (HCC) 02/13/2014  . Anxiety   . Depression   . Hypertension   . Rheumatoid arthritis (HCC)   . Substance  abuse (HCC)     Assessment/Plan:   3 Days Post-Op Procedure(s) (LRB): INCISION AND DRAINAGE ABSCESS (Right) Active Problems:   Cellulitis  Estimated body mass index is 25.11 kg/m as calculated from the following:   Height as of this encounter: 6\' 2"  (1.88 m).   Weight as of this encounter: 88.7 kg. Continue with IV abx, cultures showing rare staphylococcus aureus. Susceptibilities pending  Not much improvement over last 24 hours. Would recommend continued IV abx and recheck in the am Follow up with [facility] ortho 9/5 for wound check   Keep Hand clean and dry   [facility], PA-C [facility] 08/28/2018, 8:33 AM

## 2018-08-28 NOTE — Progress Notes (Signed)
Patient expressed concern for paying for medications upon discharge, notified Josh care manager patient need.

## 2018-08-28 NOTE — Progress Notes (Signed)
Pharmacy Antibiotic Note  Curtis Cobb is a 47 y.o. male admitted on 08/23/2018 with cellulitis.  Pharmacy has been consulted for Vancomycin and Unasyn dosing.  Plan: Vancomycin 1500 IV every 12 hours.  Goal trough 10-15 mcg/mL. Unasyn 3g IV q6h  Originally, Vancomycin trough was scheduled prior to 5th dose, 0829 @ 1930. However, Vancomycin was incorrectly infused at ~2pm yesterday after the 9 am dose.  0829 VT 11 @ 0733. Will increase Vancomycin to 1750 g every 12 hours. Will check next level on 0901 @ 0930.  Kinetics Ke 0.087 Vd 62.09 T1/2 8 hrs Cmin 16.45  9/1:  VT @ 21:30 = 15 mcg/mL Will continue this pt on current dose of Vancomycin 1750 mg IV Q12H.   Height: 6\' 2"  (188 cm) Weight: 195 lb 9.6 oz (88.7 kg) IBW/kg (Calculated) : 82.2  Temp (24hrs), Avg:97.8 F (36.6 C), Min:97.4 F (36.3 C), Max:98 F (36.7 C)  Recent Labs  Lab 08/23/18 1116 08/23/18 1333 08/23/18 2056 08/24/18 0514 08/25/18 0338 08/26/18 0430 08/26/18 0733 08/27/18 0420 08/28/18 0354 08/28/18 2129  WBC 21.6*  --   --  20.1* 21.5* 18.2*  --  14.4* 12.1*  --   CREATININE 0.97  --   --  0.76 0.71 0.67  --   --  0.88  --   LATICACIDVEN  --  1.4 1.6  --   --   --   --   --   --   --   VANCOTROUGH  --   --   --   --   --   --  11*  --   --  15    Estimated Creatinine Clearance: 124.5 mL/min (by C-G formula based on SCr of 0.88 mg/dL).    Allergies  Allergen Reactions  . Sulfa Antibiotics Other (See Comments)    Unknown reaction    Antimicrobials this admission: Cefazolin 8/27 x 1  Unasyn 8/27 >>  Vancomycin 8/27 >>  Thank you for allowing pharmacy to be a part of this patient's care.   9/27, PharmD Pharmacy Resident  08/28/2018 9:56 PM

## 2018-08-29 LAB — CBC
HEMATOCRIT: 38.3 % — AB (ref 40.0–52.0)
Hemoglobin: 13 g/dL (ref 13.0–18.0)
MCH: 29.9 pg (ref 26.0–34.0)
MCHC: 34 g/dL (ref 32.0–36.0)
MCV: 87.9 fL (ref 80.0–100.0)
Platelets: 395 10*3/uL (ref 150–440)
RBC: 4.36 MIL/uL — ABNORMAL LOW (ref 4.40–5.90)
RDW: 14.2 % (ref 11.5–14.5)
WBC: 12.9 10*3/uL — ABNORMAL HIGH (ref 3.8–10.6)

## 2018-08-29 LAB — C-REACTIVE PROTEIN: CRP: 3.8 mg/dL — ABNORMAL HIGH (ref <1.0)

## 2018-08-29 MED ORDER — SODIUM CHLORIDE 0.9 % IV SOLN
INTRAVENOUS | Status: DC | PRN
  Administered 2018-08-29 (×2): 250 mL via INTRAVENOUS

## 2018-08-29 NOTE — Progress Notes (Addendum)
[facility] [place] at [facility]   PATIENT NAME: Curtis Cobb    MR#:  [id number]  DATE OF BIRTH:  Nov 20, 1971  SUBJECTIVE:  CHIEF COMPLAINT:   Chief Complaint  Patient presents with  . Arm Swelling   - doing about he same, localized pain  Now- indurated area at the site of I&D - ortho recommended 1 more day monitoring  REVIEW OF SYSTEMS:  Review of Systems  Constitutional: Negative for chills and fever.  HENT: Negative for congestion, ear discharge, hearing loss and nosebleeds.   Eyes: Negative for blurred vision and double vision.  Respiratory: Negative for cough, shortness of breath and wheezing.   Cardiovascular: Negative for chest pain and palpitations.  Gastrointestinal: Negative for abdominal pain, constipation, diarrhea, nausea and vomiting.  Genitourinary: Negative for dysuria and urgency.  Musculoskeletal: Positive for joint pain and myalgias.  Neurological: Negative for dizziness, focal weakness, seizures, weakness and headaches.  Psychiatric/Behavioral: Negative for depression.    DRUG ALLERGIES:   Allergies  Allergen Reactions  . Sulfa Antibiotics Other (See Comments)    Unknown reaction    VITALS:  Blood pressure 122/77, pulse (!) 57, temperature 98.1 F (36.7 C), temperature source Oral, resp. rate 18, height 6\' 2"  (1.88 m), weight 88.7 kg, SpO2 97 %.  PHYSICAL EXAMINATION:  Physical Exam   GENERAL:  47 y.o.-year-old patient lying in the bed with no acute distress.  EYES: Pupils equal, round, reactive to light and accommodation. No scleral icterus. Extraocular muscles intact.  HEENT: Head atraumatic, normocephalic. Oropharynx and nasopharynx clear.  NECK:  Supple, no jugular venous distention. No thyroid enlargement, no tenderness.  LUNGS: Normal breath sounds bilaterally, no wheezing, rales,rhonchi or crepitation. No use of accessory muscles of respiration.  CARDIOVASCULAR: S1, S2 normal. No murmurs, rubs, or gallops.  ABDOMEN:  Soft, nontender, nondistended. Bowel sounds present. No organomegaly or mass.  EXTREMITIES: Status post I&D of the right hand, drain is removed.  Open wound indurated with purulent material on it.  No pedal edema, cyanosis, or clubbing.  NEUROLOGIC: Cranial nerves II through XII are intact. Muscle strength 5/5 in all extremities. Sensation intact. Gait not checked.  PSYCHIATRIC: The patient is alert and oriented x 3.  SKIN: No obvious rash, lesion, or ulcer.    LABORATORY PANEL:   CBC Recent Labs  Lab 08/29/18 0440  WBC 12.9*  HGB 13.0  HCT 38.3*  PLT 395   ------------------------------------------------------------------------------------------------------------------  Chemistries  Recent Labs  Lab 08/23/18 1116 08/24/18 0514  08/28/18 0354  NA 140 140   < > 141  K 3.6 3.0*   < > 3.7  CL 103 105   < > 104  CO2 26 25   < > 28  GLUCOSE 105* 97   < > 81  BUN 6 7   < > 12  CREATININE 0.97 0.76   < > 0.88  CALCIUM 8.7* 7.9*   < > 8.5*  MG  --  1.9  --   --   AST 16  --   --   --   ALT 10  --   --   --   ALKPHOS 50  --   --   --   BILITOT 0.8  --   --   --    < > = values in this interval not displayed.   ------------------------------------------------------------------------------------------------------------------  Cardiac Enzymes No results for input(s): TROPONINI in the last 168 hours. ------------------------------------------------------------------------------------------------------------------  RADIOLOGY:  No results found.  EKG:  Orders placed or performed during the hospital encounter of 05/22/16  . EKG 12-Lead  . EKG 12-Lead  . ED EKG  . ED EKG  . EKG    ASSESSMENT AND PLAN:   47 year old male with past medical history significant for hypertension, rheumatoid arthritis and alcohol use presents to hospital secondary to right hand swelling and erythema.  1.  Right hand cellulitis-appreciate orthopedics consult. -on IV antibiotics.  Off  steroids --MRI of the hand showing cellulitis, no fluid collection or no osteomyelitis. - Status post I&D now purulent material sent for cultures. Cultures growing MRSA - On vancomycin -Drain is removed, still has some purulent material draining out. -negative blood cultures.  Appreciate orthopedic's consult.  - continue IV ABX today and NPO after midnight- ortho to f/u and see if he needs further drainage tomorrow- if improving- d/c tomorrow  2.  History of rheumatoid arthritis-off steroids.  Stable. Monitor.    3.  Hypokalemia-replaced  4.  Depression anxiety-continue Seroquel  5.  DVT prophylaxis-on Lovenox-    Discharge tomorrow likely    All the records are reviewed and case discussed with Care Management/Social Workerr. Management plans discussed with the patient, family and they are in agreement.  CODE STATUS: Full code  TOTAL TIME TAKING CARE OF THIS PATIENT: 28 minutes.   POSSIBLE D/C TOMORROW, DEPENDING ON CLINICAL CONDITION.   Enid Baas M.D on 08/29/2018 at 11:58 AM  Between 7am to 6pm - Pager - [phone]  After 6pm go to [email or web address] - password [employer]  [facility] [place] [facility]  Office  [phone]  CC: Primary care physician; Patient, No Pcp Per

## 2018-08-29 NOTE — Progress Notes (Addendum)
Subjective: 4 Days Post-Op Procedure(s) (LRB): INCISION AND DRAINAGE ABSCESS (Right) Patient reports pain as moderate. Does report that pain has been improved compared to yesterday but still reports a tightness sensation in the hand. Patient is well, and has had no acute complaints or problems No recent fevers at this time. No SOB, chest pain or abdominal pain.  Objective: Vital signs in last 24 hours: Temp:  [97.4 F (36.3 C)-98.1 F (36.7 C)] 98.1 F (36.7 C) (09/02 0420) Pulse Rate:  [57-66] 57 (09/02 0420) Resp:  [18] 18 (09/02 0420) BP: (122-134)/(77-90) 122/77 (09/02 0420) SpO2:  [97 %-100 %] 97 % (09/02 0420)  Intake/Output from previous day: 09/01 0701 - 09/02 0700 In: 1080 [P.O.:1080] Out: -  Intake/Output this shift: No intake/output data recorded.  Recent Labs    08/27/18 0420 08/28/18 0354 08/29/18 0440  HGB 10.6* 12.2* 13.0   Recent Labs    08/28/18 0354 08/29/18 0440  WBC 12.1* 12.9*  RBC 4.09* 4.36*  HCT 36.1* 38.3*  PLT 413 395   Recent Labs    08/28/18 0354  NA 141  K 3.7  CL 104  CO2 28  BUN 12  CREATININE 0.88  GLUCOSE 81  CALCIUM 8.5*   No results for input(s): LABPT, INR in the last 72 hours.  EXAM General - Patient is Alert, Appropriate and Oriented Extremity - Neurovascular intact Sensation intact distally Intact pulses distally No cellulitis present Compartment soft  Close to making a full fist with moderate pain. Minimal erythema base of dorsal aspect of ring finger. 1x1 cm opening along dorsal aspect of right ring finger is present with moderate purulent drainage present. Tender to palpation over the dorsal aspect of the hand. No fluctuance present. Patient able to maintain full active extension. NVI RUE Dressing - New fresh dressing applied to the right hand at today's visit. Motor Function - intact, moving digits well on exam.   Past Medical History:  Diagnosis Date  . Alcohol use disorder, moderate, dependence (HCC)  02/13/2014  . Anxiety   . Depression   . Hypertension   . Rheumatoid arthritis (HCC)   . Substance abuse (HCC)     Assessment/Plan:   4 Days Post-Op Procedure(s) (LRB): INCISION AND DRAINAGE ABSCESS (Right) Active Problems:   Cellulitis  Estimated body mass index is 25.11 kg/m as calculated from the following:   Height as of this encounter: 6\' 2"  (1.88 m).   Weight as of this encounter: 88.7 kg.   Labs reviewed this AM. WBC up to 12.9 this AM. Continue with IV Abx, cultures demonstrated rare staphylococcus aureus, sensitive to Vancomycin. Pt reports minimal improvement over 24 hours however based on documentation I believe that drainage is worse today compared to yesterday. Dr. will evaluated later today to determine if repeat I&D is necessary, if so plan will be to make the patient NPO after midnight and perform procedure tomorrow.  Joice Lofts, PA-C [facility]  08/29/2018, 8:10 AM    Addendum:  Right hand appears much improved as compared to Friday, but still some purulence at wound margins and slight drainage on dressings.  I will make patient NPO after midnight as a precaution and reassess patient's hand in AM before deciding on whether to bring patient back for a repeat I&D tomorrow afternoon.  Monday, MD [facility]  08/29/2018, 9:50 AM

## 2018-08-30 ENCOUNTER — Encounter
Admission: EM | Disposition: A | Discharge status: Home / Self Care | Payer: Self-pay | Source: Home / Self Care | Attending: Internal Medicine

## 2018-08-30 ENCOUNTER — Inpatient Hospital Stay: Payer: Self-pay | Admitting: Certified Registered Nurse Anesthetist

## 2018-08-30 ENCOUNTER — Encounter: Payer: Self-pay | Admitting: *Deleted

## 2018-08-30 HISTORY — PX: INCISION AND DRAINAGE ABSCESS: SHX5864

## 2018-08-30 LAB — CBC
HEMATOCRIT: 39.3 % — AB (ref 40.0–52.0)
HEMOGLOBIN: 13.3 g/dL (ref 13.0–18.0)
MCH: 29.7 pg (ref 26.0–34.0)
MCHC: 33.8 g/dL (ref 32.0–36.0)
MCV: 87.8 fL (ref 80.0–100.0)
Platelets: 423 10*3/uL (ref 150–440)
RBC: 4.48 MIL/uL (ref 4.40–5.90)
RDW: 14.2 % (ref 11.5–14.5)
WBC: 13.9 10*3/uL — AB (ref 3.8–10.6)

## 2018-08-30 LAB — AEROBIC/ANAEROBIC CULTURE W GRAM STAIN (SURGICAL/DEEP WOUND)

## 2018-08-30 LAB — CREATININE, SERUM
Creatinine, Ser: 0.9 mg/dL (ref 0.61–1.24)
GFR calc non Af Amer: >60 mL/min (ref >60)

## 2018-08-30 SURGERY — INCISION AND DRAINAGE, ABSCESS
Anesthesia: General | Site: Hand | Laterality: Right | Wound class: Dirty or Infected

## 2018-08-30 MED ORDER — ONDANSETRON HCL 4 MG/2ML IJ SOLN: 4.0000 mg | Freq: Four times a day (QID) | INTRAMUSCULAR | Status: DC | PRN

## 2018-08-30 MED ORDER — MIDAZOLAM HCL 2 MG/2ML IJ SOLN
INTRAMUSCULAR | Status: AC
  Filled 2018-08-30: qty 2

## 2018-08-30 MED ORDER — NEOMYCIN-POLYMYXIN B GU 40-200000 IR SOLN
Status: AC
  Filled 2018-08-30: qty 20

## 2018-08-30 MED ORDER — FENTANYL CITRATE (PF) 100 MCG/2ML IJ SOLN
INTRAMUSCULAR | Status: AC
  Administered 2018-08-30: 50 ug via INTRAVENOUS
  Filled 2018-08-30: qty 2

## 2018-08-30 MED ORDER — LIDOCAINE HCL (CARDIAC) PF 100 MG/5ML IV SOSY
PREFILLED_SYRINGE | INTRAVENOUS | Status: DC | PRN
  Administered 2018-08-30: 100 mg via INTRAVENOUS

## 2018-08-30 MED ORDER — METOCLOPRAMIDE HCL 5 MG/ML IJ SOLN: 5.0000 mg | Freq: Three times a day (TID) | INTRAMUSCULAR | Status: DC | PRN

## 2018-08-30 MED ORDER — LIDOCAINE HCL (PF) 2 % IJ SOLN
INTRAMUSCULAR | Status: AC
  Filled 2018-08-30: qty 10

## 2018-08-30 MED ORDER — MIDAZOLAM HCL 2 MG/2ML IJ SOLN
INTRAMUSCULAR | Status: DC | PRN
  Administered 2018-08-30: 2 mg via INTRAVENOUS

## 2018-08-30 MED ORDER — FENTANYL CITRATE (PF) 100 MCG/2ML IJ SOLN
INTRAMUSCULAR | Status: AC
  Filled 2018-08-30: qty 2

## 2018-08-30 MED ORDER — LACTATED RINGERS IV SOLN
INTRAVENOUS | Status: DC | PRN
  Administered 2018-08-30: 13:00:00 via INTRAVENOUS

## 2018-08-30 MED ORDER — NEOMYCIN-POLYMYXIN B GU 40-200000 IR SOLN
Status: DC | PRN
  Administered 2018-08-30: 4 mL

## 2018-08-30 MED ORDER — KETAMINE HCL 50 MG/ML IJ SOLN
INTRAMUSCULAR | Status: DC | PRN
  Administered 2018-08-30: 150 mg via INTRAMUSCULAR

## 2018-08-30 MED ORDER — HYDROMORPHONE HCL 1 MG/ML IJ SOLN
0.2500 mg | INTRAMUSCULAR | Status: AC | PRN
  Administered 2018-08-30 (×4): 0.25 mg via INTRAVENOUS

## 2018-08-30 MED ORDER — GLYCOPYRROLATE 0.2 MG/ML IJ SOLN
INTRAMUSCULAR | Status: DC | PRN
  Administered 2018-08-30: 0.2 mg via INTRAVENOUS

## 2018-08-30 MED ORDER — BUPIVACAINE HCL (PF) 0.5 % IJ SOLN
INTRAMUSCULAR | Status: AC
  Filled 2018-08-30: qty 30

## 2018-08-30 MED ORDER — METOCLOPRAMIDE HCL 5 MG PO TABS: 5.0000 mg | ORAL_TABLET | Freq: Three times a day (TID) | ORAL | Status: DC | PRN

## 2018-08-30 MED ORDER — PROPOFOL 10 MG/ML IV BOLUS
INTRAVENOUS | Status: AC
  Filled 2018-08-30: qty 20

## 2018-08-30 MED ORDER — FENTANYL CITRATE (PF) 100 MCG/2ML IJ SOLN
INTRAMUSCULAR | Status: DC | PRN
  Administered 2018-08-30: 100 ug via INTRAVENOUS

## 2018-08-30 MED ORDER — SODIUM CHLORIDE 0.9 % IV SOLN: INTRAVENOUS | Status: DC | PRN

## 2018-08-30 MED ORDER — DOCUSATE SODIUM 100 MG PO CAPS: 100.0000 mg | ORAL_CAPSULE | Freq: Two times a day (BID) | ORAL | Status: DC

## 2018-08-30 MED ORDER — ONDANSETRON HCL 4 MG PO TABS: 4.0000 mg | ORAL_TABLET | Freq: Four times a day (QID) | ORAL | Status: DC | PRN

## 2018-08-30 MED ORDER — KETAMINE HCL 50 MG/ML IJ SOLN
INTRAMUSCULAR | Status: AC
  Filled 2018-08-30: qty 10

## 2018-08-30 MED ORDER — FENTANYL CITRATE (PF) 100 MCG/2ML IJ SOLN
25.0000 ug | INTRAMUSCULAR | Status: DC | PRN
  Administered 2018-08-30 (×3): 50 ug via INTRAVENOUS

## 2018-08-30 MED ORDER — OXYCODONE HCL 5 MG/5ML PO SOLN: 5.0000 mg | Freq: Once | ORAL | Status: DC | PRN

## 2018-08-30 MED ORDER — PROPOFOL 10 MG/ML IV BOLUS
INTRAVENOUS | Status: DC | PRN
  Administered 2018-08-30: 180 mg via INTRAVENOUS

## 2018-08-30 MED ORDER — OXYCODONE HCL 5 MG PO TABS: 5.0000 mg | ORAL_TABLET | Freq: Once | ORAL | Status: DC | PRN

## 2018-08-30 MED ORDER — HYDROMORPHONE HCL 1 MG/ML IJ SOLN
INTRAMUSCULAR | Status: AC
  Administered 2018-08-30: 0.25 mg via INTRAVENOUS
  Filled 2018-08-30: qty 1

## 2018-08-30 SURGICAL SUPPLY — 38 items
BLADE SURG SZ10 CARB STEEL (BLADE) ×3 IMPLANT
BNDG COHESIVE 4X5 TAN STRL (GAUZE/BANDAGES/DRESSINGS) ×6 IMPLANT
BNDG ESMARK 4X12 TAN STRL LF (GAUZE/BANDAGES/DRESSINGS) IMPLANT
CANISTER SUCT 1200ML W/VALVE (MISCELLANEOUS) ×3 IMPLANT
CHLORAPREP W/TINT 26ML (MISCELLANEOUS) ×6 IMPLANT
CUFF TOURN 18 STER (MISCELLANEOUS) ×3 IMPLANT
CUFF TOURN 24 STER (MISCELLANEOUS) IMPLANT
ELECT REM PT RETURN 9FT ADLT (ELECTROSURGICAL) ×3
ELECTRODE REM PT RTRN 9FT ADLT (ELECTROSURGICAL) ×1 IMPLANT
GLOVE BIO SURGEON STRL SZ8 (GLOVE) ×3 IMPLANT
GLOVE INDICATOR 8.0 STRL GRN (GLOVE) ×3 IMPLANT
GLOVE SURG ORTHO 8.5 STRL (GLOVE) ×3 IMPLANT
GOWN STRL REUS W/ TWL LRG LVL3 (GOWN DISPOSABLE) ×1 IMPLANT
GOWN STRL REUS W/ TWL XL LVL3 (GOWN DISPOSABLE) ×1 IMPLANT
GOWN STRL REUS W/TWL LRG LVL3 (GOWN DISPOSABLE) ×2
GOWN STRL REUS W/TWL XL LVL3 (GOWN DISPOSABLE) ×2
KIT TURNOVER KIT A (KITS) ×3 IMPLANT
LABEL OR SOLS (LABEL) IMPLANT
NDL SAFETY ECLIPSE 18X1.5 (NEEDLE) ×1 IMPLANT
NEEDLE HYPO 18GX1.5 SHARP (NEEDLE) ×2
NS IRRIG 1000ML POUR BTL (IV SOLUTION) ×3 IMPLANT
PACK EXTREMITY ARMC (MISCELLANEOUS) ×3 IMPLANT
PAD CAST CTTN 4X4 STRL (SOFTGOODS) IMPLANT
PADDING CAST COTTON 4X4 STRL (SOFTGOODS)
SPLINT CAST 1 STEP 3X12 (MISCELLANEOUS) IMPLANT
SPONGE LAP 18X18 RF (DISPOSABLE) ×3 IMPLANT
STAPLER SKIN PROX 35W (STAPLE) IMPLANT
STOCKINETTE BIAS CUT 4 980044 (GAUZE/BANDAGES/DRESSINGS) IMPLANT
STOCKINETTE IMPERVIOUS 9X36 MD (GAUZE/BANDAGES/DRESSINGS) ×3 IMPLANT
SUT ETHILON 4-0 (SUTURE)
SUT ETHILON 4-0 FS2 18XMFL BLK (SUTURE)
SUT PROLENE 4 0 PS 2 18 (SUTURE) ×3 IMPLANT
SUT VIC AB 2-0 CT1 36 (SUTURE) IMPLANT
SUT VIC AB 4-0 SH 27 (SUTURE)
SUT VIC AB 4-0 SH 27XANBCTRL (SUTURE) IMPLANT
SUT VICRYL+ 3-0 36IN CT-1 (SUTURE) IMPLANT
SUTURE ETHLN 4-0 FS2 18XMF BLK (SUTURE) IMPLANT
SYR 10ML LL (SYRINGE) ×3 IMPLANT

## 2018-08-30 NOTE — Progress Notes (Signed)
Subjective: The patient notes that his hand feels essentially unchanged as compared to yesterday.  He still has soreness over the dorsal aspect of the hand.  He has no fevers, and denies any numbness or paresthesias to his fingertips.   Objective: Vital signs in last 24 hours: Temp:  [97.9 F (36.6 C)-98.6 F (37 C)] 98.6 F (37 C) (09/03 0451) Pulse Rate:  [67-79] 67 (09/03 0451) Resp:  [17-19] 17 (09/03 0451) BP: (106-133)/(63-84) 106/63 (09/03 0451) SpO2:  [97 %-100 %] 97 % (09/03 0451)  Intake/Output from previous day: 09/02 0701 - 09/03 0700 In: 2334.1 [P.O.:480; I.V.:71.7; IV Piggyback:1782.4] Out: 0  Intake/Output this shift: No intake/output data recorded.  Recent Labs    08/28/18 0354 08/29/18 0440  HGB 12.2* 13.0   Recent Labs    08/28/18 0354 08/29/18 0440  WBC 12.1* 12.9*  RBC 4.09* 4.36*  HCT 36.1* 38.3*  PLT 413 395   Recent Labs    08/28/18 0354 08/30/18 0435  NA 141  --   K 3.7  --   CL 104  --   CO2 28  --   BUN 12  --   CREATININE 0.88 0.90  GLUCOSE 81  --   CALCIUM 8.5*  --    No results for input(s): LABPT, INR in the last 72 hours.  Physical Exam: Examination of the right hand demonstrates a small amount of thick purulent material around the open end of the incision over the dorsal aspect of the ring finger with surrounding erythema.  There is moderate tenderness to palpation just proximal to this point.  No purulence can be expressed from the dorsal aspect of the hand with gentle milking, however.  Swelling in the hand both dorsally and palmarly is much improved.  He is able to actively flex and extend all digits.  He is neurovascularly intact to his hand.  Assessment: Right dorsal hand abscess, slowly improving symptomatically.  Plan: The treatment options are reviewed with the patient.  After discussing both the option of continue to observe versus performing a repeat irrigation and debridement, the patient would like to proceed with  formal irrigation and debridement.  Therefore, we will proceed with this procedure later today.  Once again, the procedure has been discussed in detail with the patient, as have the potential risks (including bleeding, persistent or recurrent infection, need for further surgery, blood clots, strokes, heart attacks and arrhythmias, etc.) benefits.  The patient states his understanding wishes to proceed.  A formal written consent will be obtained by the nursing staff.   Excell Seltzer Kishia Shackett 08/30/2018, 7:39 AM

## 2018-08-30 NOTE — Progress Notes (Signed)
[facility] - [place] at [facility]   PATIENT NAME: Curtis Cobb    MR#:  [id number]  DATE OF BIRTH:  1971-04-22  SUBJECTIVE:  CHIEF COMPLAINT:   Chief Complaint  Patient presents with  . Arm Swelling   - doing about he same, localized pain  Now- indurated area at the site of I&D - ortho recommended I&D again.  REVIEW OF SYSTEMS:  Review of Systems  Constitutional: Negative for chills and fever.  HENT: Negative for congestion, ear discharge, hearing loss and nosebleeds.   Eyes: Negative for blurred vision and double vision.  Respiratory: Negative for cough, shortness of breath and wheezing.   Cardiovascular: Negative for chest pain and palpitations.  Gastrointestinal: Negative for abdominal pain, constipation, diarrhea, nausea and vomiting.  Genitourinary: Negative for dysuria and urgency.  Musculoskeletal: Positive for joint pain and myalgias.  Neurological: Negative for dizziness, focal weakness, seizures, weakness and headaches.  Psychiatric/Behavioral: Negative for depression.    DRUG ALLERGIES:   Allergies  Allergen Reactions  . Sulfa Antibiotics Other (See Comments)    Unknown reaction    VITALS:  Blood pressure (!) 114/102, pulse 68, temperature 97.8 F (36.6 C), resp. rate 19, height 6\' 2"  (1.88 m), weight 88.7 kg, SpO2 98 %.  PHYSICAL EXAMINATION:  Physical Exam   GENERAL:  47-year-old patient lying in the bed with no acute distress.  EYES: Pupils equal, round, reactive to light and accommodation. No scleral icterus. Extraocular muscles intact.  HEENT: Head atraumatic, normocephalic. Oropharynx and nasopharynx clear.  NECK:  Supple, no jugular venous distention. No thyroid enlargement, no tenderness.  LUNGS: Normal breath sounds bilaterally, no wheezing, rales,rhonchi or crepitation. No use of accessory muscles of respiration.  CARDIOVASCULAR: S1, S2 normal. No murmurs, rubs, or gallops.  ABDOMEN: Soft, nontender, nondistended. Bowel  sounds present. No organomegaly or mass.  EXTREMITIES: Status post I&D of the right hand, drain is removed.  Open wound indurated with purulent material on it.  No pedal edema, cyanosis, or clubbing.  NEUROLOGIC: Cranial nerves II through XII are intact. Muscle strength 5/5 in all extremities. Sensation intact. Gait not checked.  PSYCHIATRIC: The patient is alert and oriented x 3.  SKIN: No obvious rash, lesion, or ulcer.    LABORATORY PANEL:   CBC Recent Labs  Lab 08/30/18 0435  WBC 13.9*  HGB 13.3  HCT 39.3*  PLT 423   ------------------------------------------------------------------------------------------------------------------  Chemistries  Recent Labs  Lab 08/24/18 0514  08/28/18 0354 08/30/18 0435  NA 140   < > 141  --   K 3.0*   < > 3.7  --   CL 105   < > 104  --   CO2 25   < > 28  --   GLUCOSE 97   < > 81  --   BUN 7   < > 12  --   CREATININE 0.76   < > 0.88 0.90  CALCIUM 7.9*   < > 8.5*  --   MG 1.9  --   --   --    < > = values in this interval not displayed.   ------------------------------------------------------------------------------------------------------------------  Cardiac Enzymes No results for input(s): TROPONINI in the last 168 hours. ------------------------------------------------------------------------------------------------------------------  RADIOLOGY:  No results found.  EKG:   Orders placed or performed during the hospital encounter of 05/22/16  . EKG 12-Lead  . EKG 12-Lead  . ED EKG  . ED EKG  . EKG    ASSESSMENT AND PLAN:   47-year old  male with past medical history significant for hypertension, rheumatoid arthritis and alcohol use presents to hospital secondary to right hand swelling and erythema.  1.  Right hand cellulitis-appreciate orthopedics consult. -on IV antibiotics.  Off steroids --MRI of the hand showing cellulitis, no fluid collection or no osteomyelitis. - Status post I&D now purulent material sent for  cultures. Cultures growing MRSA - On vancomycin -Drain is removed, still has some purulent material draining out. -negative blood cultures.  Appreciate orthopedic's consult.  - continue IV ABX today and NPO after midnight- Plan for repeat I & D today.  2.  History of rheumatoid arthritis-off steroids.  Stable. Monitor.    3.  Hypokalemia-replaced  4.  Depression anxiety-continue Seroquel  5.  DVT prophylaxis-on Lovenox-      All the records are reviewed and case discussed with Care Management/Social Workerr. Management plans discussed with the patient, family and they are in agreement.  CODE STATUS: Full code  TOTAL TIME TAKING CARE OF THIS PATIENT: 32 minutes.   POSSIBLE D/C TOMORROW, DEPENDING ON CLINICAL CONDITION.   Altamese Dilling M.D on 08/30/2018 at 3:45 PM  Between 7am to 6pm - Pager - [phone]  After 6pm go to [email or web address] - password [email or web address]  [facility]  Office  [phone]  CC: Primary care physician; Patient, No Pcp Per

## 2018-08-30 NOTE — Anesthesia Post-op Follow-up Note (Signed)
Anesthesia QCDR form completed.        

## 2018-08-30 NOTE — Op Note (Signed)
08/30/2018  2:14 PM  Patient:   Curtis Cobb  Pre-Op Diagnosis:   Right hand abscess.  Post-Op Diagnosis:   Same  Procedure:   Irrigation and debridement of right hand abscess.  Surgeon:   Maryagnes Amos, MD  Assistant:   None  Anesthesia:   General LMA  Findings:   As above.  Complications:   None  Fluids:   600 cc crystalloid  EBL:   5 cc  UOP:   None  TT:   22 minutes at 250 mmHg  Drains:   PR x1  Closure:   4-0 Prolene interrupted sutures  Brief Clinical Note:   The patient is a 47 year old male who is now 5 days status post a formal irrigation debridement of a large dorsal hand abscess.  The patient has been watch closely for the last 5 days.  His Penrose drain was removed and he remained on IV antibiotics.  The patient's clinical picture has improved on a daily basis but has begun to plateau over the past 48 hours.  In addition, his white count is beginning to rise again.  Therefore, the patient is brought back to the operating room for repeat irrigation and debridement of his right dorsal hand abscess.  Procedure:   The patient was brought into the operating room and lain in the supine position.  After adequate general laryngeal mask anesthesia was obtained, the patient's right hand and upper extremity were prepped with ChloraPrep solution before being draped sterilely.  Peri-operative antibiotics have been continued.  A timeout was performed to verify the appropriate surgical site before the tourniquet was inflated to 250 mmHg after elevating the hand for 1 minute.  The stitches placed previously were removed and the margins of the wound debrided with a #15 blade.  The abscess was thoroughly explored.  There was an area of purulent material along the radial aspect of the ring proximal phalanx, but no other pockets of purulent material were identified.    The wound was copiously irrigated with a liter of antibiotic irrigation using a bulb syringe.  The proximal portion  of the wound was reapproximated using 4-0 Prolene interrupted sutures after placing a Penrose drain into the depths of the wound.  A sterile bulky dressing was applied to the hand before the patient was awakened, extubated, and returned to the recovery room in satisfactory condition after tolerating the procedure well.

## 2018-08-30 NOTE — Transfer of Care (Signed)
Immediate Anesthesia Transfer of Care Note  Patient: Steward Drone  Procedure(s) Performed: INCISION AND DRAINAGE ABSCESS (Right Hand)  Patient Location: PACU  Anesthesia Type:General  Level of Consciousness: awake, drowsy and patient cooperative  Airway & Oxygen Therapy: Patient Spontanous Breathing  Post-op Assessment: Report given to RN and Post -op Vital signs reviewed and stable  Post vital signs: Reviewed and stable  Last Vitals:  Vitals Value Taken Time  BP    Temp    Pulse 79 08/30/2018  2:13 PM  Resp    SpO2 100 % 08/30/2018  2:13 PM  Vitals shown include unvalidated device data.  Last Pain:  Vitals:   08/30/18 1244  TempSrc: Tympanic  PainSc: 5       Patients Stated Pain Goal: 2 (08/30/18 1244)  Complications: No apparent anesthesia complications

## 2018-08-30 NOTE — Anesthesia Preprocedure Evaluation (Addendum)
Anesthesia Evaluation  Patient identified by MRN, date of birth, ID band Patient awake    Reviewed: Allergy & Precautions, H&P , NPO status , Patient's Chart, lab work & pertinent test results  Airway Mallampati: III  TM Distance: >3 FB Neck ROM: full    Dental  (+) Poor Dentition   Pulmonary neg pulmonary ROS,           Cardiovascular hypertension,      Neuro/Psych PSYCHIATRIC DISORDERS Anxiety Depression negative neurological ROS     GI/Hepatic negative GI ROS, Neg liver ROS,   Endo/Other  negative endocrine ROS  Renal/GU negative Renal ROS  negative genitourinary   Musculoskeletal  (+) Arthritis ,   Abdominal   Peds  Hematology negative hematology ROS (+)   Anesthesia Other Findings Past Medical History: 02/13/2014: Alcohol use disorder, moderate, dependence (HCC) No date: Anxiety No date: Depression No date: Hypertension No date: Rheumatoid arthritis (HCC) No date: Substance abuse [facility])  Past Surgical History: 08/25/2018: INCISION AND DRAINAGE ABSCESS; Right     Comment:  Procedure: INCISION AND DRAINAGE ABSCESS;  Surgeon:               Christena Flake, MD;  Location: [facility] ORS;  Service:               Orthopedics;  Laterality: Right; No date: none  BMI    Body Mass Index:  25.11 kg/m      Reproductive/Obstetrics negative OB ROS                           Anesthesia Physical Anesthesia Plan  ASA: II  Anesthesia Plan: General and General LMA   Post-op Pain Management:    Induction:   PONV Risk Score and Plan: Ondansetron and Dexamethasone  Airway Management Planned:   Additional Equipment:   Intra-op Plan:   Post-operative Plan:   Informed Consent: I have reviewed the patients History and Physical, chart, labs and discussed the procedure including the risks, benefits and alternatives for the proposed anesthesia with the patient or authorized representative who has  indicated his/her understanding and acceptance.   Dental Advisory Given  Plan Discussed with: Anesthesiologist, CRNA and Surgeon  Anesthesia Plan Comments:        Anesthesia Quick Evaluation

## 2018-08-30 NOTE — Anesthesia Postprocedure Evaluation (Signed)
Anesthesia Post Note  Patient: Curtis Cobb  Procedure(s) Performed: INCISION AND DRAINAGE ABSCESS (Right Hand)  Patient location during evaluation: PACU Anesthesia Type: General Level of consciousness: awake and alert Pain management: pain level controlled Vital Signs Assessment: post-procedure vital signs reviewed and stable Respiratory status: spontaneous breathing, nonlabored ventilation, respiratory function stable and patient connected to nasal cannula oxygen Cardiovascular status: blood pressure returned to baseline and stable Postop Assessment: no apparent nausea or vomiting Anesthetic complications: no     Last Vitals:  Vitals:   08/30/18 1244 08/30/18 1413  BP: 116/82 (!) 132/100  Pulse: 74 79  Resp: 16 15  Temp: (!) 35.9 C 36.6 C  SpO2: 100% 100%    Last Pain:  Vitals:   08/30/18 1413  TempSrc:   PainSc: 0-No pain                 Jovita Gamma

## 2018-08-30 NOTE — Addendum Note (Signed)
Addendum  created 08/30/18 1500 by Jovita Gamma, MD   Order list changed

## 2018-08-30 NOTE — Clinical Social Work Note (Signed)
CSW received consult for SNF placement. Patient has no insurance or income and no way to pay for SNF and will therefore have to go home. RN CM has also been consulted. CSW signing off.   Ruthe Mannan MSW, [address] [phone]

## 2018-08-30 NOTE — Anesthesia Procedure Notes (Signed)
Procedure Name: LMA Insertion Date/Time: 08/30/2018 1:26 PM Performed by: Darrol Jump, CRNA Pre-anesthesia Checklist: Patient identified, Emergency Drugs available, Suction available and Patient being monitored Patient Re-evaluated:Patient Re-evaluated prior to induction Oxygen Delivery Method: Circle system utilized Preoxygenation: Pre-oxygenation with 100% oxygen Induction Type: IV induction LMA Size: 5.0 Number of attempts: 1 Placement Confirmation: positive ETCO2 and breath sounds checked- equal and bilateral Dental Injury: Teeth and Oropharynx as per pre-operative assessment

## 2018-08-30 NOTE — Progress Notes (Signed)
IS education complete, pt understands technique and reason for use, pt inspire 2579ml+.

## 2018-08-31 ENCOUNTER — Encounter: Payer: Self-pay | Admitting: Surgery

## 2018-08-31 LAB — BASIC METABOLIC PANEL
ANION GAP: 9 (ref 5–15)
BUN: 14 mg/dL (ref 6–20)
CHLORIDE: 102 mmol/L (ref 98–111)
CO2: 24 mmol/L (ref 22–32)
Calcium: 8.8 mg/dL — ABNORMAL LOW (ref 8.9–10.3)
Creatinine, Ser: 0.97 mg/dL (ref 0.61–1.24)
GFR calc non Af Amer: >60 mL/min (ref >60)
GLUCOSE: 94 mg/dL (ref 70–99)
Potassium: 3.9 mmol/L (ref 3.5–5.1)
Sodium: 135 mmol/L (ref 135–145)

## 2018-08-31 LAB — CBC
HEMATOCRIT: 40.4 % (ref 40.0–52.0)
HEMOGLOBIN: 13.8 g/dL (ref 13.0–18.0)
MCH: 29.6 pg (ref 26.0–34.0)
MCHC: 34.1 g/dL (ref 32.0–36.0)
MCV: 86.9 fL (ref 80.0–100.0)
Platelets: 469 10*3/uL — ABNORMAL HIGH (ref 150–440)
RBC: 4.64 MIL/uL (ref 4.40–5.90)
RDW: 13.9 % (ref 11.5–14.5)
WBC: 15 10*3/uL — ABNORMAL HIGH (ref 3.8–10.6)

## 2018-08-31 MED ORDER — CLINDAMYCIN HCL 150 MG PO CAPS
300.0000 mg | ORAL_CAPSULE | Freq: Four times a day (QID) | ORAL | Status: DC
  Administered 2018-08-31 – 2018-09-01 (×4): 300 mg via ORAL
  Filled 2018-08-31 (×5): qty 2

## 2018-08-31 NOTE — Plan of Care (Signed)
  Problem: Pain Managment: Goal: General experience of comfort will improve Outcome: Progressing   Problem: Skin Integrity: Goal: Risk for impaired skin integrity will decrease Outcome: Progressing   Problem: Clinical Measurements: Goal: Ability to avoid or minimize complications of infection will improve Outcome: Progressing

## 2018-08-31 NOTE — Progress Notes (Signed)
[facility] - [place] at [facility]   PATIENT NAME: Curtis Cobb    MR#:  [id number]  DATE OF BIRTH:  03-Jun-1971  SUBJECTIVE:  CHIEF COMPLAINT:   Chief Complaint  Patient presents with  . Arm Swelling   - doing better now, localized pain  Now- indurated area at the site of I&D - ortho recommended I&D again. Done 08/30/18- better now.  REVIEW OF SYSTEMS:  Review of Systems  Constitutional: Negative for chills and fever.  HENT: Negative for congestion, ear discharge, hearing loss and nosebleeds.   Eyes: Negative for blurred vision and double vision.  Respiratory: Negative for cough, shortness of breath and wheezing.   Cardiovascular: Negative for chest pain and palpitations.  Gastrointestinal: Negative for abdominal pain, constipation, diarrhea, nausea and vomiting.  Genitourinary: Negative for dysuria and urgency.  Musculoskeletal: Positive for joint pain and myalgias.  Neurological: Negative for dizziness, focal weakness, seizures, weakness and headaches.  Psychiatric/Behavioral: Negative for depression.    DRUG ALLERGIES:   Allergies  Allergen Reactions  . Sulfa Antibiotics Other (See Comments)    Unknown reaction    VITALS:  Blood pressure 109/71, pulse 74, temperature 98 F (36.7 C), temperature source Oral, resp. rate 18, height 6\' 2"  (1.88 m), weight 88.7 kg, SpO2 99 %.  PHYSICAL EXAMINATION:  Physical Exam   GENERAL:  47 y.o.-year-old patient lying in the bed with no acute distress.  EYES: Pupils equal, round, reactive to light and accommodation. No scleral icterus. Extraocular muscles intact.  HEENT: Head atraumatic, normocephalic. Oropharynx and nasopharynx clear.  NECK:  Supple, no jugular venous distention. No thyroid enlargement, no tenderness.  LUNGS: Normal breath sounds bilaterally, no wheezing, rales,rhonchi or crepitation. No use of accessory muscles of respiration.  CARDIOVASCULAR: S1, S2 normal. No murmurs, rubs, or gallops.    ABDOMEN: Soft, nontender, nondistended. Bowel sounds present. No organomegaly or mass.  EXTREMITIES: Status post I&D of the right hand, drain is removed.  Open wound indurated with purulent material on it.  No pedal edema, cyanosis, or clubbing.  NEUROLOGIC: Cranial nerves II through XII are intact. Muscle strength 5/5 in all extremities. Sensation intact. Gait not checked.  PSYCHIATRIC: The patient is alert and oriented x 3.  SKIN: No obvious rash, lesion, or ulcer.    LABORATORY PANEL:   CBC Recent Labs  Lab 08/31/18 0318  WBC 15.0*  HGB 13.8  HCT 40.4  PLT 469*   ------------------------------------------------------------------------------------------------------------------  Chemistries  Recent Labs  Lab 08/31/18 0318  NA 135  K 3.9  CL 102  CO2 24  GLUCOSE 94  BUN 14  CREATININE 0.97  CALCIUM 8.8*   ------------------------------------------------------------------------------------------------------------------  Cardiac Enzymes No results for input(s): TROPONINI in the last 168 hours. ------------------------------------------------------------------------------------------------------------------  RADIOLOGY:  No results found.  EKG:   Orders placed or performed during the hospital encounter of 05/22/16  . EKG 12-Lead  . EKG 12-Lead  . ED EKG  . ED EKG  . EKG    ASSESSMENT AND PLAN:   47 year old male with past medical history significant for hypertension, rheumatoid arthritis and alcohol use presents to hospital secondary to right hand swelling and erythema.  1.  Right hand cellulitis-appreciate orthopedics consult. -on IV antibiotics.  Off steroids --MRI of the hand showing cellulitis, no fluid collection or no osteomyelitis. - Status post I&D now purulent material sent for cultures. Cultures growing MRSA - On vancomycin -Drain is removed, still has some purulent material draining out. -negative blood cultures.  Appreciate orthopedic's consult.   -  continue IV ABX - repeat I & D done on 08/30/18. - ortho will re-dress tomorrow and decide about discharge.  2.  History of rheumatoid arthritis-off steroids.  Stable. Monitor.    3.  Hypokalemia-replaced  4.  Depression anxiety-continue Seroquel  5.  DVT prophylaxis-on Lovenox-    All the records are reviewed and case discussed with Care Management/Social Workerr. Management plans discussed with the patient, family and they are in agreement.  CODE STATUS: Full code  TOTAL TIME TAKING CARE OF THIS PATIENT: 32 minutes.   POSSIBLE D/C TOMORROW, DEPENDING ON CLINICAL CONDITION.   Altamese Dilling M.D on 08/31/2018 at 2:23 PM  Between 7am to 6pm - Pager - [phone]  After 6pm go to [email or web address] - password [employer]  [facility]  Office  [phone]  CC: Primary care physician; Patient, No Pcp Per

## 2018-08-31 NOTE — Evaluation (Signed)
Occupational Therapy Evaluation Patient Details Name: Curtis Cobb MRN: [id number] DOB: [birth year] Today's Date: 08/31/2018    History of Present Illness 47 year old male with past medical history significant for hypertension, rheumatoid arthritis and alcohol use presents to hospital secondary to right hand swelling and erythema. S/p I&D on 8/29 with repeat I&D on 9/3.   Clinical Impression   Pt seen for OT evaluation this date. Prior to hospital admission, pt was independent in all aspects, living with roommates, a dog, and a 44yo African grey parrot. Currently pt demonstrates impairments in R hand pain, impaired R hand ROM, strength, and functional use requiring pt to modify how he performs basic ADL tasks to accommodate deficits. Pt reports 6/10 pain in dorsal aspect of R hand. Pt instructed in general precautions, elevating RUE, and ice. Pt encouraged to speak with MD regarding pain medication and antibiotics schedule as well as when pt can get back to driving. Pt verbalized understanding. Pt would benefit from skilled OT to address noted RUE impairments and functional limitations (see below for any additional details) in order to maximize safety and independence while minimizing risk of further injury or dysfunction of his R hand.  Upon hospital discharge, recommend OP OT hand therapy services. All further OT needs can be met on an OP basis. Will sign off. Please re-consult if additional needs arise.     Follow Up Recommendations  Outpatient OT;Follow surgeon's recommendation for DC plan and follow-up therapies(OP OT hand therapy)    Equipment Recommendations  None recommended by OT    Recommendations for Other Services       Precautions / Restrictions Precautions Precautions: None Restrictions Weight Bearing Restrictions: No      Mobility Bed Mobility Overal bed mobility: Independent                Transfers Overall transfer level: Independent                     Balance Overall balance assessment: Independent                                         ADL either performed or assessed with clinical judgement   ADL Overall ADL's : Modified independent                                       General ADL Comments: Pt able to perform basic ADL tasks at modified independent level utilizing non-dominant hand more with R hand assisting in bilateral tasks.      Vision Baseline Vision/History: No visual deficits Patient Visual Report: No change from baseline       Perception     Praxis      Pertinent Vitals/Pain Pain Assessment: 0-10 Pain Score: 6  Pain Location: dorsal aspect of R hand to wrist Pain Descriptors / Indicators: Burning;Dull;Aching Pain Intervention(s): Limited activity within patient's tolerance;Monitored during session;Premedicated before session     Hand Dominance Right   Extremity/Trunk Assessment Upper Extremity Assessment Upper Extremity Assessment: RUE deficits/detail(LUE WFL) RUE Deficits / Details: shoulder, elbow WFL, sensation intact in all fingers, pt able to wiggle fingers, limited by pain on dorsal aspect of hand, no redness/swelling noted RUE: Unable to fully assess due to pain RUE Sensation: WNL RUE Coordination: decreased fine motor   Lower Extremity  Assessment Lower Extremity Assessment: Overall WFL for tasks assessed   Cervical / Trunk Assessment Cervical / Trunk Assessment: Normal   Communication Communication Communication: No difficulties   Cognition Arousal/Alertness: Awake/alert Behavior During Therapy: WFL for tasks assessed/performed Overall Cognitive Status: Within Functional Limits for tasks assessed                                     General Comments  R hand dressing in place    Exercises Other Exercises Other Exercises: Pt educated in precautions for R hand and strategies to perform bathing without getting R hand wet.  Other Exercises:  Pt encouraged to have roommates provide care for pets including dog and African parrot to minimize exposure to bacteria. Other Exercises: Pt educated in role of OP OT hand therapy to maximize recovery and return to PLOF for R dominant hand.   Shoulder Instructions      Home Living Family/patient expects to be discharged to:: Private residence Living Arrangements: Non-relatives/Friends;Spouse/significant other Available Help at Discharge: Family;Available PRN/intermittently;Friend(s) Type of Home: House Home Access: Stairs to enter [employer] of Steps: 2 Entrance Stairs-Rails: None Home Layout: One level     Bathroom Shower/Tub: Teacher, early years/pre: Standard     Home Equipment: None          Prior Functioning/Environment Level of Independence: Independent        Comments: Independent, no falls        OT Problem List:        OT Treatment/Interventions:      OT Goals(Current goals can be found in the care plan section) Acute Rehab OT Goals Patient Stated Goal: have less pain and return to PLOF OT Goal Formulation: All assessment and education complete, DC therapy  OT Frequency:     Barriers to D/C:            Co-evaluation              AM-PAC PT "6 Clicks" Daily Activity     Outcome Measure Help from another person eating meals?: None Help from another person taking care of personal grooming?: None Help from another person toileting, which includes using toliet, bedpan, or urinal?: None Help from another person bathing (including washing, rinsing, drying)?: None Help from another person to put on and taking off regular upper body clothing?: None Help from another person to put on and taking off regular lower body clothing?: None 6 Click Score: 24   End of Session    Activity Tolerance: Patient tolerated treatment well Patient left: in bed;with call bell/phone within reach  OT Visit Diagnosis: Pain Pain - Right/Left:  Right Pain - part of body: Hand                Time: 7614-7092 OT Time Calculation (min): 16 min Charges:  OT General Charges $OT Visit: 1 Visit OT Evaluation $OT Eval Low Complexity: 1 Low   Jeni Salles, MPH, MS, OTR/L ascom [phone] 08/31/18, 8:44 AM

## 2018-08-31 NOTE — Progress Notes (Signed)
Subjective: 1 Day Post-Op Procedure(s) (LRB): INCISION AND DRAINAGE ABSCESS (Right)  Original I&D performed on 08/25/18 and repeat performed on 08/30/18 Patient reports pain as mild.  Reports the pain is better this morning compared to yesterday. Patient is well, and has had no acute complaints or problems No recent fevers at this time. No SOB, chest pain or abdominal pain.  Objective: Vital signs in last 24 hours: Temp:  [96.6 F (35.9 C)-98.2 F (36.8 C)] 98.2 F (36.8 C) (09/04 0417) Pulse Rate:  [63-96] 66 (09/04 0417) Resp:  [10-20] 17 (09/04 0417) BP: (100-143)/(69-102) 100/75 (09/04 0417) SpO2:  [94 %-100 %] 94 % (09/04 0417)  Intake/Output from previous day: 09/03 0701 - 09/04 0700 In: 2077.9 [I.V.:1077.9; IV Piggyback:1000] Out: 5 [Blood:5] Intake/Output this shift: No intake/output data recorded.  Recent Labs    08/29/18 0440 08/30/18 0435 08/31/18 0318  HGB 13.0 13.3 13.8   Recent Labs    08/30/18 0435 08/31/18 0318  WBC 13.9* 15.0*  RBC 4.48 4.64  HCT 39.3* 40.4  PLT 423 469*   Recent Labs    08/30/18 0435 08/31/18 0318  NA  --  135  K  --  3.9  CL  --  102  CO2  --  24  BUN  --  14  CREATININE 0.90 0.97  GLUCOSE  --  94  CALCIUM  --  8.8*   No results for input(s): LABPT, INR in the last 72 hours.  EXAM General - Patient is Alert, Appropriate and Oriented Extremity - Neurovascular intact Sensation intact distally Intact pulses distally No cellulitis present Compartment soft  Close to making a full fist with moderate pain. Minimal erythema base of dorsal aspect of ring finger.  Primrose drain present, the drain was advanced 2 cm and cut to shorten.  No purulent drainage, bloody drainage to the patient's bandages. Tender to palpation over the dorsal aspect of the hand. No fluctuance present. Patient able to maintain full active extension. NVI RUE New dressing applied to the patient's hand this morning. Dressing - Bloody drainage to the right  hand when removing the dressing this morning. Motor Function - intact, moving digits well on exam.   Past Medical History:  Diagnosis Date  . Alcohol use disorder, moderate, dependence (HCC) 02/13/2014  . Anxiety   . Depression   . Hypertension   . Rheumatoid arthritis (HCC)   . Substance abuse (HCC)     Assessment/Plan:   1 Day Post-Op Procedure(s) (LRB): INCISION AND DRAINAGE ABSCESS (Right) Active Problems:   Cellulitis  Estimated body mass index is 25.11 kg/m as calculated from the following:   Height as of this encounter: 6\' 2"  (1.88 m).   Weight as of this encounter: 88.7 kg.   Labs reviewed this AM. WBC up to 15.0 this AM. Continue with IV Abx at this time. Swelling and purulent drainage much improved this morning. Will remove drain tomorrow morning and plan will be for discharge tomorrow.  , PA-C [facility]  08/31/2018, 7:49 AM

## 2018-09-01 LAB — CBC
HCT: 41.3 % (ref 40.0–52.0)
Hemoglobin: 14 g/dL (ref 13.0–18.0)
MCH: 29.7 pg (ref 26.0–34.0)
MCHC: 33.9 g/dL (ref 32.0–36.0)
MCV: 87.6 fL (ref 80.0–100.0)
PLATELETS: 446 10*3/uL — AB (ref 150–440)
RBC: 4.71 MIL/uL (ref 4.40–5.90)
RDW: 14.2 % (ref 11.5–14.5)
WBC: 12.1 10*3/uL — AB (ref 3.8–10.6)

## 2018-09-01 LAB — BASIC METABOLIC PANEL
ANION GAP: 9 (ref 5–15)
BUN: 21 mg/dL — AB (ref 6–20)
CALCIUM: 8.9 mg/dL (ref 8.9–10.3)
CO2: 25 mmol/L (ref 22–32)
CREATININE: 1.1 mg/dL (ref 0.61–1.24)
Chloride: 103 mmol/L (ref 98–111)
GFR calc Af Amer: >60 mL/min (ref >60)
GLUCOSE: 99 mg/dL (ref 70–99)
Potassium: 4.2 mmol/L (ref 3.5–5.1)
Sodium: 137 mmol/L (ref 135–145)

## 2018-09-01 MED ORDER — OXYCODONE HCL 5 MG PO TABS: 5.0000 mg | ORAL_TABLET | Freq: Four times a day (QID) | ORAL | 0 refills | Status: DC | PRN

## 2018-09-01 MED ORDER — CLINDAMYCIN HCL 300 MG PO CAPS: 300.0000 mg | ORAL_CAPSULE | Freq: Four times a day (QID) | ORAL | 0 refills | Status: AC

## 2018-09-01 NOTE — Discharge Instructions (Signed)
-  Keep dressing dry and intact. -If moderate drainage can remove dressing but replace with fresh dry dressing from pharmacy. -Follow-up with [facility] on Monday 09/05/18 for a skin check.

## 2018-09-01 NOTE — Progress Notes (Signed)
Pt is being discharged home.  Discharge papers given and explained to pt.  Pt verbalized understanding.  Meds and f/u appointments reviewed.  Rx given.  Awaiting transportation. 

## 2018-09-01 NOTE — Discharge Summary (Signed)
[facility] - [place] at [facility]   PATIENT NAME: Curtis Cobb    MR#:  [id number]  DATE OF BIRTH:  Oct 15, 1971  DATE OF ADMISSION:  08/23/2018 ADMITTING PHYSICIAN: Auburn Bilberry, MD  DATE OF DISCHARGE: 09/01/2018   PRIMARY CARE PHYSICIAN: Patient, No Pcp Per    ADMISSION DIAGNOSIS:  Cellulitis of right upper extremity [L03.113]  DISCHARGE DIAGNOSIS:  Active Problems:   Cellulitis   SECONDARY DIAGNOSIS:   Past Medical History:  Diagnosis Date  . Alcohol use disorder, moderate, dependence (HCC) 02/13/2014  . Anxiety   . Depression   . Hypertension   . Rheumatoid arthritis (HCC)   . Substance abuse [facility])     HOSPITAL COURSE:   47 year old male with past medical history significant for hypertension, rheumatoid arthritis and alcohol use presents to hospital secondary to right hand swelling and erythema.  1.  Right hand cellulitis-appreciate orthopedics consult. -on IV antibiotics.  Off steroids --MRI of the hand showing cellulitis, no fluid collection or no osteomyelitis. - Status post I&D now purulent material sent for cultures. Cultures growing MRSA - On vancomycin -Drain is removed, still has some purulent material draining out. -negative blood cultures.  Appreciate orthopedic's consult.  - continue IV ABX - repeat I & D done on 08/30/18. - Ortho- did dressing change, much better- suggest to discfharge with oral Abx and follow in clinic,.  2.  History of rheumatoid arthritis-off steroids.  Stable. Monitor.    3.  Hypokalemia-replaced  4.  Depression anxiety-continue Seroquel  5.  DVT prophylaxis-on Lovenox-   DISCHARGE CONDITIONS:   Stable.  CONSULTS OBTAINED:  Treatment Team:  Christena Flake, MD  DRUG ALLERGIES:   Allergies  Allergen Reactions  . Sulfa Antibiotics Other (See Comments)    Unknown reaction    DISCHARGE MEDICATIONS:   Allergies as of 09/01/2018      Reactions   Sulfa Antibiotics Other (See Comments)    Unknown reaction      Medication List    STOP taking these medications   predniSONE 20 MG tablet Commonly known as:  DELTASONE     TAKE these medications   clindamycin 300 MG capsule Commonly known as:  CLEOCIN Take 1 capsule (300 mg total) by mouth every 6 (six) hours for 10 days.   diclofenac 75 MG EC tablet Commonly known as:  VOLTAREN Take 1 tablet (75 mg total) by mouth 2 (two) times daily as needed.   ibuprofen 400 MG tablet Commonly known as:  ADVIL,MOTRIN Take 1 tablet (400 mg total) by mouth every 8 (eight) hours as needed.   LORazepam 2 MG tablet Commonly known as:  ATIVAN Take 2 mg by mouth at bedtime as needed. for sleep   oxyCODONE 5 MG immediate release tablet Commonly known as:  Oxy IR/ROXICODONE Take 1-2 tablets (5-10 mg total) by mouth every 6 (six) hours as needed for moderate pain (pain score 4-6).   QUEtiapine 300 MG tablet Commonly known as:  SEROQUEL Take 1 tablet (300 mg total) by mouth at bedtime.        DISCHARGE INSTRUCTIONS:    Follow with ortho clinic next week.  If you experience worsening of your admission symptoms, develop shortness of breath, life threatening emergency, suicidal or homicidal thoughts you must seek medical attention immediately by calling 911 or calling your MD immediately  if symptoms less severe.  You Must read complete instructions/literature along with all the possible adverse reactions/side effects for all the Medicines you take and that  have been prescribed to you. Take any new Medicines after you have completely understood and accept all the possible adverse reactions/side effects.   Please note  You were cared for by a hospitalist during your hospital stay. If you have any questions about your discharge medications or the care you received while you were in the hospital after you are discharged, you can call the unit and asked to speak with the hospitalist on call if the hospitalist that took care of you is not  available. Once you are discharged, your primary care physician will handle any further medical issues. Please note that NO REFILLS for any discharge medications will be authorized once you are discharged, as it is imperative that you return to your primary care physician (or establish a relationship with a primary care physician if you do not have one) for your aftercare needs so that they can reassess your need for medications and monitor your lab values.    Today   CHIEF COMPLAINT:   Chief Complaint  Patient presents with  . Arm Swelling    HISTORY OF PRESENT ILLNESS:  [name]  is a 47 y.o. male with a known history of alcohol abuse in the past, anxiety, depression, hypertension and rheumatoid arthritis who was taking prednisone up to 2 weeks ago and he ran out of it who is presenting to the emergency room with complaint of right hand swelling extending to his wrist.  Patient was seen in consultation by orthopedics who recommended IV antibiotics.  Patient complains of fever chills.  Also complains of some nausea no chest pain or shortness of breath  VITAL SIGNS:  Blood pressure 93/65, pulse (!) 113, temperature 98 F (36.7 C), temperature source Oral, resp. rate 17, height 6\' 2"  (1.88 m), weight 88.7 kg, SpO2 98 %.  I/O:    Intake/Output Summary (Last 24 hours) at 09/01/2018 1200 Last data filed at 08/31/2018 1352 Gross per 24 hour  Intake 1083.36 ml  Output -  Net 1083.36 ml    PHYSICAL EXAMINATION:  GENERAL:  47 y.o.-year-old patient lying in the bed with no acute distress.  EYES: Pupils equal, round, reactive to light and accommodation. No scleral icterus. Extraocular muscles intact.  HEENT: Head atraumatic, normocephalic. Oropharynx and nasopharynx clear.  NECK:  Supple, no jugular venous distention. No thyroid enlargement, no tenderness.  LUNGS: Normal breath sounds bilaterally, no wheezing, rales,rhonchi or crepitation. No use of accessory muscles of respiration.   CARDIOVASCULAR: S1, S2 normal. No murmurs, rubs, or gallops.  ABDOMEN: Soft, non-tender, non-distended. Bowel sounds present. No organomegaly or mass.  EXTREMITIES: No pedal edema, cyanosis, or clubbing. Right hand in dressing, able to move all fingers. NEUROLOGIC: Cranial nerves II through XII are intact. Muscle strength 5/5 in all extremities. Sensation intact. Gait not checked.  PSYCHIATRIC: The patient is alert and oriented x 3.  SKIN: No obvious rash, lesion, or ulcer.   DATA REVIEW:   CBC Recent Labs  Lab 09/01/18 0455  WBC 12.1*  HGB 14.0  HCT 41.3  PLT 446*    Chemistries  Recent Labs  Lab 09/01/18 0455  NA 137  K 4.2  CL 103  CO2 25  GLUCOSE 99  BUN 21*  CREATININE 1.10  CALCIUM 8.9    Cardiac Enzymes No results for input(s): TROPONINI in the last 168 hours.  Microbiology Results  Results for orders placed or performed during the hospital encounter of 08/23/18  Blood culture (routine x 2)     Status: None  Collection Time: 08/23/18  1:33 PM  Result Value Ref Range Status   Specimen Description BLOOD LEFT HAND  Final   Special Requests   Final    BOTTLES DRAWN AEROBIC AND ANAEROBIC Blood Culture adequate volume   Culture   Final    NO GROWTH 5 DAYS Performed at [facility], [address] [place], [place] [address]    Report Status 08/28/2018 FINAL  Final  Blood culture (routine x 2)     Status: None   Collection Time: 08/23/18  1:33 PM  Result Value Ref Range Status   Specimen Description BLOOD LEFT FA  Final   Special Requests   Final    BOTTLES DRAWN AEROBIC AND ANAEROBIC Blood Culture results may not be optimal due to an excessive volume of blood received in culture bottles   Culture   Final    NO GROWTH 5 DAYS Performed at [facility], [address] [place], [place] [address]    Report Status 08/28/2018 FINAL  Final  MRSA PCR Screening     Status: None   Collection Time: 08/24/18 12:41 PM  Result Value Ref Range  Status   MRSA by PCR NEGATIVE NEGATIVE Final    Comment:        The GeneXpert MRSA Assay (FDA approved for NASAL specimens only), is one component of a comprehensive MRSA colonization surveillance program. It is not intended to diagnose MRSA infection nor to guide or monitor treatment for MRSA infections. Performed at [facility], [address], [place], [place] [address]   Aerobic/Anaerobic Culture (surgical/deep wound)     Status: None   Collection Time: 08/25/18  2:44 PM  Result Value Ref Range Status   Specimen Description   Final    WOUND Performed at [facility], [address], [place], [place] [address]    Special Requests   Final    right ring finger abscess Performed at [facility], [address] [place], [place] [address]    Gram Stain   Final    MODERATE WBC PRESENT, PREDOMINANTLY PMN RARE GRAM POSITIVE COCCI    Culture   Final    RARE METHICILLIN RESISTANT STAPHYLOCOCCUS AUREUS NO ANAEROBES ISOLATED Performed at [facility], [address], [place], [place] [address]    Report Status 08/30/2018 FINAL  Final   Organism ID, Bacteria METHICILLIN RESISTANT STAPHYLOCOCCUS AUREUS  Final      Susceptibility   Methicillin resistant staphylococcus aureus - MIC*    CIPROFLOXACIN >=8 RESISTANT Resistant     ERYTHROMYCIN <=0.25 SENSITIVE Sensitive     GENTAMICIN <=0.5 SENSITIVE Sensitive     OXACILLIN >=4 RESISTANT Resistant     TETRACYCLINE <=1 SENSITIVE Sensitive     VANCOMYCIN <=0.5 SENSITIVE Sensitive     TRIMETH/SULFA <=10 SENSITIVE Sensitive     CLINDAMYCIN <=0.25 SENSITIVE Sensitive     RIFAMPIN <=0.5 SENSITIVE Sensitive     Inducible Clindamycin NEGATIVE Sensitive     * RARE METHICILLIN RESISTANT STAPHYLOCOCCUS AUREUS    RADIOLOGY:  No results found.  EKG:   Orders placed or performed during the hospital encounter of 05/22/16  . EKG 12-Lead  . EKG 12-Lead  . ED EKG  . ED EKG  . EKG      Management  plans discussed with the patient, family and they are in agreement.  CODE STATUS:     Code Status Orders  (From admission, onward)         Start  Ordered   08/23/18 2034  Full code  Continuous     08/23/18 2033        Code Status History    Date Active Date Inactive Code Status Order ID Comments User Context   07/10/2016 0031 07/13/2016 1529 Full Code [id number]  Audery Amel, MD Inpatient   03/30/2016 1841 03/31/2016 1852 Full Code [id number]  Shari Prows, MD Inpatient   03/25/2016 2102 03/30/2016 1830 Full Code [id number]  Crissie Figures, MD ED   11/18/2015 1659 11/20/2015 1945 Full Code [id number]  Currie Paris, RN Inpatient   11/17/2015 1850 11/18/2015 1659 Full Code [id number]  Beau Fanny, MD Inpatient   11/13/2015 0604 11/17/2015 1850 Full Code [id number]  Arnaldo Natal, MD Inpatient   10/07/2015 1833 10/12/2015 2030 Full Code [id number]  Jimmy Footman, MD Inpatient   07/12/2015 1438 07/16/2015 1754 Full Code [id number]  Charm Rings, NP Inpatient   07/10/2015 1917 07/12/2015 1438 Full Code [id number]  Azalia Bilis, MD ED   06/25/2015 0624 06/26/2015 1510 Full Code [id number]  Paula Libra, MD ED   03/17/2014 1115 03/22/2014 1025 Full Code [id number]  Thresa Ross, MD Inpatient   03/17/2014 1100 03/17/2014 1115 Full Code [id number]  Nanine Means, NP Inpatient   03/15/2014 2254 03/17/2014 1100 Full Code [id number]  Warnell Forester, MD ED   02/10/2014 1511 02/12/2014 1828 Full Code [id number]  Trevor Mace, PA-C ED      TOTAL TIME TAKING CARE OF THIS PATIENT: 35 minutes.    Altamese Dilling M.D on 09/01/2018 at 12:00 PM  Between 7am to 6pm - Pager - [phone]  After 6pm go to [email or web address] - password [email or web address]  [facility]  Office  [phone]  CC: Primary care physician; Patient, No Pcp Per   Note: This dictation was prepared with Dragon dictation along with smaller phrase technology. Any transcriptional errors that result  from this process are unintentional.

## 2018-09-01 NOTE — Care Management Note (Signed)
Case Management Note  Patient Details  Name: Curtis Cobb MRN: [id number] Date of Birth: 1971/06/22  Subjective/Objective:  [facility]. Patient also discharging with oxycodone which cannot be filled at [facility], provided goodrx coupon for [facility], patient agreeable with price point.                 Action/Plan:   Expected Discharge Date:  09/01/18               Expected Discharge Plan:     In-House Referral:     Discharge planning Services  Indigent Health Clinic, CM Consult, Medication Assistance  Post Acute Care Choice:    Choice offered to:     DME Arranged:    DME Agency:     HH Arranged:    HH Agency:     Status of Service:  Completed, signed off  If discussed at [employer], dates discussed:    Additional Comments:  Virgel Manifold, RN 09/01/2018, 2:12 PM

## 2018-09-01 NOTE — Progress Notes (Signed)
Subjective: 2 Days Post-Op Procedure(s) (LRB): INCISION AND DRAINAGE ABSCESS (Right)  Original I&D performed on 08/25/18 and repeat performed on 08/30/18 Patient reports pain as mild. Patient is well, and has had no acute complaints or problems No recent fevers at this time. No SOB, chest pain or abdominal pain.  Objective: Vital signs in last 24 hours: Temp:  [98 F (36.7 C)-98.3 F (36.8 C)] 98 F (36.7 C) (09/05 0411) Pulse Rate:  [74-113] 113 (09/05 0413) Resp:  [13-18] 17 (09/05 0411) BP: (88-118)/(65-75) 93/65 (09/05 0413) SpO2:  [98 %-100 %] 98 % (09/05 0411)  Intake/Output from previous day: 09/04 0701 - 09/05 0700 In: 1083.4 [IV Piggyback:1083.4] Out: -  Intake/Output this shift: No intake/output data recorded.  Recent Labs    08/30/18 0435 08/31/18 0318 09/01/18 0455  HGB 13.3 13.8 14.0   Recent Labs    08/31/18 0318 09/01/18 0455  WBC 15.0* 12.1*  RBC 4.64 4.71  HCT 40.4 41.3  PLT 469* 446*   Recent Labs    08/31/18 0318 09/01/18 0455  NA 135 137  K 3.9 4.2  CL 102 103  CO2 24 25  BUN 14 21*  CREATININE 0.97 1.10  GLUCOSE 94 99  CALCIUM 8.8* 8.9   No results for input(s): LABPT, INR in the last 72 hours.  EXAM General - Patient is Alert, Appropriate and Oriented Extremity - Neurovascular intact Sensation intact distally Intact pulses distally No cellulitis present Compartment soft  Close to making a full fist with moderate pain. Minimal erythema base of dorsal aspect of ring finger.  Primrose drain present, the drain was removed this AM.   Minimal purulent drainage this AM, bloody drainage to dry dressing. Tender to palpation over the dorsal aspect of the hand. No fluctuance present. Patient able to maintain full active extension. NVI RUE New dressing applied to the patient's hand this morning. Dressing - Minimal purulent drainage when dry dressing removed this AM. Motor Function - intact, moving digits well on exam.   Past Medical  History:  Diagnosis Date  . Alcohol use disorder, moderate, dependence (HCC) 02/13/2014  . Anxiety   . Depression   . Hypertension   . Rheumatoid arthritis (HCC)   . Substance abuse (HCC)     Assessment/Plan:   2 Days Post-Op Procedure(s) (LRB): INCISION AND DRAINAGE ABSCESS (Right) Active Problems:   Cellulitis  Estimated body mass index is 25.11 kg/m as calculated from the following:   Height as of this encounter: 6\' 2"  (1.88 m).   Weight as of this encounter: 88.7 kg.   Labs reviewed this AM. WBC down to 12.1 this AM. Drain removed today. Can discharge today on oral antibiotics. Keep hand dry and keep the dressing intact. Follow-up with [facility] on Monday for a skin check.  Saturday, PA-C [facility]  09/01/2018, 7:39 AM

## 2018-09-05 DIAGNOSIS — L02511 Cutaneous abscess of right hand: Secondary | ICD-10-CM | POA: Insufficient documentation

## 2018-10-08 ENCOUNTER — Emergency Department
Admission: EM | Admit: 2018-10-08 | Discharge: 2018-10-09 | Disposition: A | Discharge status: Other Acute Inpatient Hospital | Payer: Self-pay | Attending: Emergency Medicine | Admitting: Emergency Medicine

## 2018-10-08 ENCOUNTER — Encounter: Payer: Self-pay | Admitting: Emergency Medicine

## 2018-10-08 ENCOUNTER — Other Ambulatory Visit: Payer: Self-pay

## 2018-10-08 DIAGNOSIS — F329 Major depressive disorder, single episode, unspecified: Secondary | ICD-10-CM | POA: Insufficient documentation

## 2018-10-08 DIAGNOSIS — F1220 Cannabis dependence, uncomplicated: Secondary | ICD-10-CM | POA: Diagnosis present

## 2018-10-08 DIAGNOSIS — F251 Schizoaffective disorder, depressive type: Secondary | ICD-10-CM | POA: Diagnosis present

## 2018-10-08 DIAGNOSIS — F419 Anxiety disorder, unspecified: Secondary | ICD-10-CM | POA: Insufficient documentation

## 2018-10-08 DIAGNOSIS — F17200 Nicotine dependence, unspecified, uncomplicated: Secondary | ICD-10-CM | POA: Insufficient documentation

## 2018-10-08 DIAGNOSIS — F1320 Sedative, hypnotic or anxiolytic dependence, uncomplicated: Secondary | ICD-10-CM | POA: Diagnosis present

## 2018-10-08 DIAGNOSIS — Z79899 Other long term (current) drug therapy: Secondary | ICD-10-CM | POA: Insufficient documentation

## 2018-10-08 DIAGNOSIS — R410 Disorientation, unspecified: Secondary | ICD-10-CM | POA: Insufficient documentation

## 2018-10-08 DIAGNOSIS — I10 Essential (primary) hypertension: Secondary | ICD-10-CM | POA: Insufficient documentation

## 2018-10-08 DIAGNOSIS — F1990 Other psychoactive substance use, unspecified, uncomplicated: Secondary | ICD-10-CM | POA: Insufficient documentation

## 2018-10-08 DIAGNOSIS — F1111 Opioid abuse, in remission: Secondary | ICD-10-CM | POA: Diagnosis present

## 2018-10-08 LAB — COMPREHENSIVE METABOLIC PANEL
ALT: 24 U/L (ref 0–44)
ANION GAP: 10 (ref 5–15)
AST: 31 U/L (ref 15–41)
Albumin: 4.2 g/dL (ref 3.5–5.0)
Alkaline Phosphatase: 55 U/L (ref 38–126)
BUN: 10 mg/dL (ref 6–20)
CHLORIDE: 100 mmol/L (ref 98–111)
CO2: 25 mmol/L (ref 22–32)
Calcium: 8.7 mg/dL — ABNORMAL LOW (ref 8.9–10.3)
Creatinine, Ser: 1.08 mg/dL (ref 0.61–1.24)
Glucose, Bld: 117 mg/dL — ABNORMAL HIGH (ref 70–99)
POTASSIUM: 3 mmol/L — AB (ref 3.5–5.1)
SODIUM: 135 mmol/L (ref 135–145)
Total Bilirubin: 1.8 mg/dL — ABNORMAL HIGH (ref 0.3–1.2)
Total Protein: 7.7 g/dL (ref 6.5–8.1)

## 2018-10-08 LAB — CBC
HCT: 40.5 % (ref 39.0–52.0)
Hemoglobin: 14 g/dL (ref 13.0–17.0)
MCH: 29.2 pg (ref 26.0–34.0)
MCHC: 34.6 g/dL (ref 30.0–36.0)
MCV: 84.4 fL (ref 80.0–100.0)
PLATELETS: 298 10*3/uL (ref 150–400)
RBC: 4.8 MIL/uL (ref 4.22–5.81)
RDW: 13.5 % (ref 11.5–15.5)
WBC: 12.7 10*3/uL — ABNORMAL HIGH (ref 4.0–10.5)
nRBC: 0 % (ref 0.0–0.2)

## 2018-10-08 LAB — URINE DRUG SCREEN, QUALITATIVE (ARMC ONLY)
AMPHETAMINES, UR SCREEN: POSITIVE — AB
BARBITURATES, UR SCREEN: NOT DETECTED
BENZODIAZEPINE, UR SCRN: NOT DETECTED
Cannabinoid 50 Ng, Ur ~~LOC~~: POSITIVE — AB
Cocaine Metabolite,Ur ~~LOC~~: NOT DETECTED
MDMA (Ecstasy)Ur Screen: NOT DETECTED
METHADONE SCREEN, URINE: NOT DETECTED
OPIATE, UR SCREEN: NOT DETECTED
Phencyclidine (PCP) Ur S: NOT DETECTED
Tricyclic, Ur Screen: NOT DETECTED

## 2018-10-08 LAB — ETHANOL

## 2018-10-08 LAB — SALICYLATE LEVEL: Salicylate Lvl: <7 mg/dL (ref 2.8–30.0)

## 2018-10-08 LAB — ACETAMINOPHEN LEVEL

## 2018-10-08 MED ORDER — DIPHENHYDRAMINE HCL 25 MG PO CAPS
50.0000 mg | ORAL_CAPSULE | Freq: Every evening | ORAL | Status: DC | PRN
  Administered 2018-10-09: 50 mg via ORAL
  Filled 2018-10-08: qty 2

## 2018-10-08 MED ORDER — ZIPRASIDONE MESYLATE 20 MG IM SOLR
10.0000 mg | Freq: Three times a day (TID) | INTRAMUSCULAR | Status: DC | PRN
  Administered 2018-10-08: 10 mg via INTRAMUSCULAR
  Filled 2018-10-08: qty 20

## 2018-10-08 MED ORDER — DIVALPROEX SODIUM 500 MG PO DR TAB
500.0000 mg | DELAYED_RELEASE_TABLET | Freq: Two times a day (BID) | ORAL | Status: DC
  Administered 2018-10-08 – 2018-10-09 (×3): 500 mg via ORAL
  Filled 2018-10-08 (×3): qty 1

## 2018-10-08 MED ORDER — LORAZEPAM 2 MG/ML IJ SOLN
2.0000 mg | Freq: Once | INTRAMUSCULAR | Status: AC
  Administered 2018-10-08: 2 mg via INTRAMUSCULAR
  Filled 2018-10-08: qty 1

## 2018-10-08 MED ORDER — LORAZEPAM 2 MG PO TABS
2.0000 mg | ORAL_TABLET | Freq: Four times a day (QID) | ORAL | Status: DC | PRN
  Administered 2018-10-09: 2 mg via ORAL
  Filled 2018-10-08: qty 1

## 2018-10-08 MED ORDER — QUETIAPINE FUMARATE 200 MG PO TABS
200.0000 mg | ORAL_TABLET | Freq: Two times a day (BID) | ORAL | Status: DC
  Administered 2018-10-08 – 2018-10-09 (×3): 200 mg via ORAL
  Filled 2018-10-08 (×3): qty 1

## 2018-10-08 MED ORDER — FOLIC ACID 1 MG PO TABS
1.0000 mg | ORAL_TABLET | Freq: Every day | ORAL | Status: DC
  Administered 2018-10-08 – 2018-10-09 (×2): 1 mg via ORAL
  Filled 2018-10-08: qty 1

## 2018-10-08 NOTE — ED Triage Notes (Signed)
Pt here with girlfriend who did a lot of coaxing to get him to allow [place] to put armband on, breathing heavy and acting agitated, girlfriend tearful stating "he is here to get help." States he has been having a panic attack this am, and has been very confused, pt states he drank "something" last night, last drug use was last month. Pt states he can't remember what he had last night, or what is in it.

## 2018-10-08 NOTE — ED Provider Notes (Signed)
[facility] Emergency Department Provider Note  Time seen: 11:39 AM  I have reviewed the triage vital signs and the nursing notes.   HISTORY  Chief Complaint Panic Attack and Altered Mental Status    HPI Curtis Cobb is a 47 y.o. male with a past medical history of anxiety, depression, substance use, presents to the emergency department for "a panic attack."  Per nurse report patient was brought here by his girlfriend which required significant coaxing to get the patient to come to the emergency department for evaluation.  States he has been very anxious with a panic attack today, confused and not acting like himself.  Patient does admit to drug use recently which he describes as "the other day" but cannot specify exactly what it was.  Does not know what he has been taking, he states they are "stimulants" but is not sure what type of drug they are.  Denies any alcohol use.  Patient is very anxious appearing during her examination, takes several seconds of pause before answering any question.  Is ultimately able to answer questions and follow commands.   Past Medical History:  Diagnosis Date  . Alcohol use disorder, moderate, dependence (HCC) 02/13/2014  . Anxiety   . Depression   . Hypertension   . Rheumatoid arthritis (HCC)   . Substance abuse [facility])     Patient Active Problem List   Diagnosis Date Noted  . Cellulitis 08/23/2018  . 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 MDD (major depressive disorder), recurrent episode, severe (HCC) 11/17/2015  . Opioid use disorder, mild, in sustained remission (HCC) 10/08/2015  . Alcohol use disorder, severe, dependence (HCC) 10/07/2015  . Cannabis use disorder, severe, dependence (HCC) 10/07/2015  . HTN (hypertension) 10/07/2015  . Rheumatoid arthritis (HCC) 10/07/2015    Past Surgical History:  Procedure Laterality Date  .  INCISION AND DRAINAGE ABSCESS Right 08/25/2018   Procedure: INCISION AND DRAINAGE ABSCESS;  Surgeon: Christena Flake, MD;  Location: [facility] ORS;  Service: Orthopedics;  Laterality: Right;  . INCISION AND DRAINAGE ABSCESS Right 08/30/2018   Procedure: INCISION AND DRAINAGE ABSCESS;  Surgeon: Christena Flake, MD;  Location: [facility] ORS;  Service: Orthopedics;  Laterality: Right;  . none      Prior to Admission medications   Medication Sig Start Date End Date Taking? Authorizing Provider  diclofenac (VOLTAREN) 75 MG EC tablet Take 1 tablet (75 mg total) by mouth 2 (two) times daily as needed. Patient not taking: Reported on 08/23/2018 02/23/17   Jacquelin Hawking, PA-C  ibuprofen (ADVIL,MOTRIN) 400 MG tablet Take 1 tablet (400 mg total) by mouth every 8 (eight) hours as needed. Patient not taking: Reported on 08/23/2018 01/17/17   Azalia Bilis, MD  LORazepam (ATIVAN) 2 MG tablet Take 2 mg by mouth at bedtime as needed. for sleep 07/08/18   [provider]  oxyCODONE (OXY IR/ROXICODONE) 5 MG immediate release tablet Take 1-2 tablets (5-10 mg total) by mouth every 6 (six) hours as needed for moderate pain (pain score 4-6). 09/01/18   Altamese Dilling, MD  QUEtiapine (SEROQUEL) 300 MG tablet Take 1 tablet (300 mg total) by mouth at bedtime. 07/13/16   Jimmy Footman, MD    Allergies  Allergen Reactions  . Sulfa Antibiotics Other (See Comments)    Unknown reaction    Family History  Problem Relation Age of Onset  . Heart attack Mother   . Stroke Mother   .  Hypertension Mother   . Mental illness Mother        Manic Depressive Bipolar Disorder  . Heart disease Mother   . Hypertension Maternal Aunt   . Mental illness Maternal Aunt   . Hypertension Maternal Aunt   . Mental illness Maternal Aunt     Social History Social History   Tobacco Use  . Smoking status: Current Every Day Smoker    Packs/day: 0.50    Years: 0.00    Pack years: 0.00  . Smokeless tobacco: Never Used   Substance Use Topics  . Alcohol use: Yes    Comment: drank last pm   . Drug use: No    Types: Amphetamines, Marijuana, Cocaine, Heroin    Comment: one month ago?     Review of Systems Constitutional: Negative for fever.  Positive for generalized anxiety. Cardiovascular: Negative for chest pain. Respiratory: Negative for shortness of breath. Gastrointestinal: Negative for abdominal pain Genitourinary: Negative for urinary compaints Musculoskeletal: Negative for musculoskeletal complaints Skin: Negative for skin complaints  Neurological: Negative for headache All other ROS negative  ____________________________________________   PHYSICAL EXAM:  VITAL SIGNS: ED Triage Vitals  Enc Vitals Group     BP 10/08/18 1109 (!) 150/97     Pulse Rate 10/08/18 1109 (!) 131     Resp 10/08/18 1109 (!) 22     Temp --      Temp Source 10/08/18 1109 Oral     SpO2 10/08/18 1109 100 %     Weight 10/08/18 1111 197 lb (89.4 kg)     Height 10/08/18 1111 6\' 2"  (1.88 m)     Head Circumference --      Peak Flow --      Pain Score 10/08/18 1111 0     Pain Loc --      Pain Edu? --      Excl. in GC? --    Constitutional: Patient is alert, quite anxious appearing, very difficult for the patient answer questions takes very long pauses between words, hyperventilating Eyes: Normal exam ENT   Head: Normocephalic and atraumatic.   Mouth/Throat: Mucous membranes are moist. Cardiovascular: Normal rate, regular rhythm. No murmur Respiratory: Patient is hyperventilating, taking deep breaths.  No obvious distress however.  Clear lung sounds bilaterally. Gastrointestinal: Soft and nontender. No distention. Musculoskeletal: Nontender with normal range of motion in all extremities. Neurologic:  Normal speech and language. No gross focal neurologic deficits  Skin:  Skin is warm, dry and intact.  Psychiatric: Very anxious appearing, denies SI or HI.    INITIAL IMPRESSION / ASSESSMENT AND PLAN / ED  COURSE  Pertinent labs & imaging results that were available during my care of the patient were reviewed by me and considered in my medical decision making (see chart for details).  Patient presents to the emergency department with confusion, not acting right per girlfriend.  Patient is very anxious during my evaluation.  Does admit to recent drug use but does not know which type.  We will check labs including urine drug screen.  We will dose 2 mg of IM Ativan to help calm the patient down while awaiting work-up.  We will place a telemetry psychiatry consultation for further evaluation.  At this time as the patient denies any SI or HI and is able to answer my questions I believe the patient does not meet IVC criteria at this time, but is agreeable to stay voluntarily for psychiatric evaluation.  Patient finally agreeable to lab draw.  Awaiting lab results as well as telemetry psychiatry evaluation.  Patient appears much better after Ativan.  I discussed the patient with TTS, the patient did admit to TTS use of methamphetamine last night.  ____________________________________________   FINAL CLINICAL IMPRESSION(S) / ED DIAGNOSES  Anxiety Confusion Substance use   Minna Antis, MD 10/08/18 1436

## 2018-10-08 NOTE — ED Notes (Signed)
Per [facility] recommendations, pt to be admitted to inpatient facility.

## 2018-10-08 NOTE — ED Notes (Signed)
Urine specimen sent to lab

## 2018-10-08 NOTE — ED Provider Notes (Signed)
telepsychiatry recommends admission, recommended medications ordered   Jene Every, MD 10/08/18 1721

## 2018-10-08 NOTE — ED Notes (Signed)
Spoke with patient while pt. Was laying in bed.  Pt. Slow with responses from this nurse.  Pt. Denies SI thoughts but was slow to respond to HI question.  Question was asked a different way and patient stated "no" to having thoughts of hurting anyone else.  Pt. Was asked on a scale of 0-10 where his anxiety was at this point.  Pt. Stated "4".  Pt. Asked to inform this nurse or tech if anxiety starts to go up.  Pt. Agreed.  Pt. Thanked this nurse.

## 2018-10-08 NOTE — ED Notes (Signed)
Pt reports he came in because he was having a panic attack. Pt guarded with assessment. Hesitant in giving answers to questions. Laying in bed breathing heavily. Denies SI/HI/AVH but when asked if he is seeing things not there or hearing voices he did not answer immediately had to be prompted several times but he denied it when he answered. Endorsed using stimulants last night but would not state what he took. Denied alcohol use. Provider informed him he will be given meds to relax so we can get urine and labs as pt refused to give provide this while in triage.

## 2018-10-08 NOTE — BH Assessment (Signed)
Assessment Note  Curtis Cobb is an 47 y.o. male who presents to the ER due to having a panic attack and "not acting right."  He was brought in by his girlfriend. Patient initially denied drug use but eventually admitted he abused several substances last night (10/07/2018). Substances he admit to using are; Cocaine, Alcohol, Pain Pills, Meth & Cannabis.  During the interview, the patient was lethargic and difficult to engage. He denies SI/HI and AV/H. He also reports of having no upcoming court dates or involvement in the legal system.    Diagnosis: Substance Abuse Disorder  Past Medical History:  Past Medical History:  Diagnosis Date  . Alcohol use disorder, moderate, dependence (HCC) 02/13/2014  . Anxiety   . Depression   . Hypertension   . Rheumatoid arthritis (HCC)   . Substance abuse [facility])     Past Surgical History:  Procedure Laterality Date  . INCISION AND DRAINAGE ABSCESS Right 08/25/2018   Procedure: INCISION AND DRAINAGE ABSCESS;  Surgeon: Christena Flake, MD;  Location: [facility] ORS;  Service: Orthopedics;  Laterality: Right;  . INCISION AND DRAINAGE ABSCESS Right 08/30/2018   Procedure: INCISION AND DRAINAGE ABSCESS;  Surgeon: Christena Flake, MD;  Location: [facility] ORS;  Service: Orthopedics;  Laterality: Right;  . none      Family History:  Family History  Problem Relation Age of Onset  . Heart attack Mother   . Stroke Mother   . Hypertension Mother   . Mental illness Mother        Manic Depressive Bipolar Disorder  . Heart disease Mother   . Hypertension Maternal Aunt   . Mental illness Maternal Aunt   . Hypertension Maternal Aunt   . Mental illness Maternal Aunt     Social History:  reports that he has been smoking. He has been smoking about 0.50 packs per day for the past 0.00 years. He has never used smokeless tobacco. He reports that he drinks alcohol. He reports that he does not use drugs.  Additional Social History:  Alcohol / Drug Use Pain Medications: See  PTA Prescriptions: See PTA Over the Counter: See PTA History of alcohol / drug use?: Yes Longest period of sobriety (when/how long): Unable to quantify  Negative Consequences of Use: Personal relationships, Financial Withdrawal Symptoms: (n/a) Substance #1 Name of Substance 1: Cocaine 1 - Last Use / Amount: "The other day" Substance #2 Name of Substance 2: Cannabis 2 - Last Use / Amount: "The other day" Substance #3 Name of Substance 3: opioids 3 - Last Use / Amount: 1-2 years ago  Substance #4 Name of Substance 4: Meth 4 - Last Use / Amount: "Yesterday" Substance #5 Name of Substance 5: Pill 5 - Last Use / Amount: "The other day" Substance #6 Name of Substance 6: Alcohol 6 - Last Use / Amount: "The other day"  CIWA: CIWA-Ar BP: (!) 138/96 Pulse Rate: (!) 124 COWS:    Allergies:  Allergies  Allergen Reactions  . Sulfa Antibiotics Other (See Comments)    Unknown reaction    Home Medications:  (Not in a hospital admission)  OB/GYN Status:  No LMP for male patient.  General Assessment Data Location of Assessment: [facility] ED TTS Assessment: In system Is this a Tele or Face-to-Face Assessment?: Face-to-Face Is this an Initial Assessment or a Re-assessment for this encounter?: Initial Assessment Patient Accompanied by:: Other(Girlfriend) Language Other than English: No Living Arrangements: Other (Comment) What gender do you identify as?: Male Marital status: Long term  relationship Pregnancy Status: No Living Arrangements: Spouse/significant other Can pt return to current living arrangement?: Yes Admission Status: Voluntary Is patient capable of signing voluntary admission?: Yes Referral Source: Self/Family/Friend Insurance type: None  Medical Screening Exam [facility] Walk-in ONLY) Medical Exam completed: Yes  Crisis Care Plan Living Arrangements: Spouse/significant other Legal Guardian: Other:(Self) Name of Psychiatrist: Reports of none Name of Therapist: Reports  of none  Education Status Is patient currently in school?: No Is the patient employed, unemployed or receiving disability?: (Unknown)  Risk to self with the past 6 months Suicidal Ideation: No Has patient been a risk to self within the past 6 months prior to admission? : No Suicidal Intent: No Has patient had any suicidal intent within the past 6 months prior to admission? : No Is patient at risk for suicide?: No Suicidal Plan?: No Has patient had any suicidal plan within the past 6 months prior to admission? : No Access to Means: No What has been your use of drugs/alcohol within the last 12 months?: Cocaine, Alcohol, Pain Pills, Meth & Cannabis Previous Attempts/Gestures: No How many times?: 0 Other Self Harm Risks: Active Addition Triggers for Past Attempts: None known Intentional Self Injurious Behavior: None Family Suicide History: No Recent stressful life event(s): Other (Comment) Persecutory voices/beliefs?: No Depression: Yes Depression Symptoms: Guilt Substance abuse history and/or treatment for substance abuse?: Yes Suicide prevention information given to non-admitted patients: Not applicable  Risk to Others within the past 6 months Homicidal Ideation: No Does patient have any lifetime risk of violence toward others beyond the six months prior to admission? : No Thoughts of Harm to Others: No Current Homicidal Intent: No Current Homicidal Plan: No Access to Homicidal Means: No Identified Victim: Reports of none History of harm to others?: No Assessment of Violence: None Noted Violent Behavior Description: Reports of none Does patient have access to weapons?: No Criminal Charges Pending?: No Does patient have a court date: No Is patient on probation?: No  Psychosis Hallucinations: None noted Delusions: None noted  Mental Status Report Appearance/Hygiene: Unremarkable, In scrubs Eye Contact: Poor Motor Activity: Unsteady Speech: Soft, Slurred Level of  Consciousness: Drowsy Mood: Sad Affect: Sullen Anxiety Level: None Thought Processes: Thought Blocking Judgement: Impaired Orientation: Person Obsessive Compulsive Thoughts/Behaviors: None  Cognitive Functioning Concentration: Decreased Memory: Recent Impaired, Remote Impaired Is patient IDD: No Insight: Poor Impulse Control: Fair Appetite: Good Have you had any weight changes? : No Change Sleep: No Change Total Hours of Sleep: 8 Vegetative Symptoms: None  ADLScreening [facility] Assessment Services) Patient's cognitive ability adequate to safely complete daily activities?: Yes Patient able to express need for assistance with ADLs?: Yes Independently performs ADLs?: Yes (appropriate for developmental age)  Prior Inpatient Therapy Prior Inpatient Therapy: No  Prior Outpatient Therapy Prior Outpatient Therapy: No Does patient have an ACCT team?: No Does patient have Intensive In-House Services?  : No Does patient have [facility] services? : No Does patient have P4CC services?: No  ADL Screening (condition at time of admission) Patient's cognitive ability adequate to safely complete daily activities?: Yes Is the patient deaf or have difficulty hearing?: No Does the patient have difficulty seeing, even when wearing glasses/contacts?: No Does the patient have difficulty concentrating, remembering, or making decisions?: No Patient able to express need for assistance with ADLs?: Yes Does the patient have difficulty dressing or bathing?: No Independently performs ADLs?: Yes (appropriate for developmental age) Does the patient have difficulty walking or climbing stairs?: No Weakness of Legs: None Weakness of Arms/Hands: None  Home Assistive Devices/Equipment Home Assistive Devices/Equipment: None  Therapy Consults (therapy consults require a physician order) PT Evaluation Needed: No OT Evalulation Needed: No SLP Evaluation Needed: No Abuse/Neglect Assessment (Assessment to be  complete while patient is alone) Abuse/Neglect Assessment Can Be Completed: Yes Physical Abuse: Denies Verbal Abuse: Denies Sexual Abuse: Denies Exploitation of patient/patient's resources: Denies Self-Neglect: Denies Values / Beliefs Cultural Requests During Hospitalization: None Spiritual Requests During Hospitalization: None Consults Spiritual Care Consult Needed: No Social Work Consult Needed: No         Child/Adolescent Assessment Running Away Risk: Denies(Patient is an adult)  Disposition:  Disposition Initial Assessment Completed for this Encounter: Yes  On Site Evaluation by:   Reviewed with Physician:    Lilyan Gilford MS, LCAS, LPC, NCC, CCSI Therapeutic Triage Specialist 10/08/2018 3:00 PM

## 2018-10-08 NOTE — ED Notes (Signed)
Pt belongings sent with home with girlfriend. [phone] mom [phone]-girlfriend.

## 2018-10-08 NOTE — BH Assessment (Signed)
Information forwarded to [facility] Inetta Fermo, [facility]), Pending review.

## 2018-10-09 ENCOUNTER — Encounter: Payer: Self-pay | Admitting: Psychiatry

## 2018-10-09 ENCOUNTER — Inpatient Hospital Stay
Admission: AD | Admit: 2018-10-09 | Discharge: 2018-10-18 | DRG: 885 | Disposition: A | Discharge status: Home / Self Care | Payer: No Typology Code available for payment source | Source: Intra-hospital | Attending: Psychiatry | Admitting: Psychiatry

## 2018-10-09 DIAGNOSIS — F17200 Nicotine dependence, unspecified, uncomplicated: Secondary | ICD-10-CM | POA: Diagnosis present

## 2018-10-09 DIAGNOSIS — Z818 Family history of other mental and behavioral disorders: Secondary | ICD-10-CM

## 2018-10-09 DIAGNOSIS — Z8249 Family history of ischemic heart disease and other diseases of the circulatory system: Secondary | ICD-10-CM | POA: Diagnosis not present

## 2018-10-09 DIAGNOSIS — F1010 Alcohol abuse, uncomplicated: Secondary | ICD-10-CM | POA: Diagnosis present

## 2018-10-09 DIAGNOSIS — F4310 Post-traumatic stress disorder, unspecified: Secondary | ICD-10-CM | POA: Diagnosis present

## 2018-10-09 DIAGNOSIS — F410 Panic disorder [episodic paroxysmal anxiety] without agoraphobia: Secondary | ICD-10-CM | POA: Diagnosis present

## 2018-10-09 DIAGNOSIS — I10 Essential (primary) hypertension: Secondary | ICD-10-CM | POA: Diagnosis present

## 2018-10-09 DIAGNOSIS — F6089 Other specific personality disorders: Secondary | ICD-10-CM | POA: Diagnosis present

## 2018-10-09 DIAGNOSIS — M069 Rheumatoid arthritis, unspecified: Secondary | ICD-10-CM | POA: Diagnosis present

## 2018-10-09 DIAGNOSIS — R45851 Suicidal ideations: Secondary | ICD-10-CM | POA: Diagnosis present

## 2018-10-09 DIAGNOSIS — Z882 Allergy status to sulfonamides status: Secondary | ICD-10-CM | POA: Diagnosis not present

## 2018-10-09 DIAGNOSIS — Z9119 Patient's noncompliance with other medical treatment and regimen: Secondary | ICD-10-CM | POA: Diagnosis not present

## 2018-10-09 DIAGNOSIS — F1111 Opioid abuse, in remission: Secondary | ICD-10-CM | POA: Diagnosis present

## 2018-10-09 DIAGNOSIS — F251 Schizoaffective disorder, depressive type: Secondary | ICD-10-CM | POA: Diagnosis not present

## 2018-10-09 DIAGNOSIS — Z823 Family history of stroke: Secondary | ICD-10-CM | POA: Diagnosis not present

## 2018-10-09 DIAGNOSIS — F333 Major depressive disorder, recurrent, severe with psychotic symptoms: Secondary | ICD-10-CM | POA: Diagnosis present

## 2018-10-09 DIAGNOSIS — Z79890 Hormone replacement therapy (postmenopausal): Secondary | ICD-10-CM | POA: Diagnosis not present

## 2018-10-09 DIAGNOSIS — Z79899 Other long term (current) drug therapy: Secondary | ICD-10-CM | POA: Diagnosis not present

## 2018-10-09 DIAGNOSIS — E039 Hypothyroidism, unspecified: Secondary | ICD-10-CM | POA: Diagnosis present

## 2018-10-09 DIAGNOSIS — F1220 Cannabis dependence, uncomplicated: Secondary | ICD-10-CM | POA: Diagnosis present

## 2018-10-09 MED ORDER — NICOTINE 21 MG/24HR TD PT24
21.0000 mg | MEDICATED_PATCH | Freq: Every day | TRANSDERMAL | Status: DC
  Administered 2018-10-10 – 2018-10-17 (×5): 21 mg via TRANSDERMAL
  Filled 2018-10-09 (×7): qty 1

## 2018-10-09 MED ORDER — POTASSIUM CHLORIDE CRYS ER 20 MEQ PO TBCR
40.0000 meq | EXTENDED_RELEASE_TABLET | Freq: Once | ORAL | Status: AC
  Administered 2018-10-09: 40 meq via ORAL
  Filled 2018-10-09: qty 2

## 2018-10-09 MED ORDER — FOLIC ACID 1 MG PO TABS
1.0000 mg | ORAL_TABLET | Freq: Every day | ORAL | Status: DC
  Administered 2018-10-10 – 2018-10-18 (×9): 1 mg via ORAL
  Filled 2018-10-09 (×9): qty 1

## 2018-10-09 MED ORDER — TRAZODONE HCL 100 MG PO TABS
100.0000 mg | ORAL_TABLET | Freq: Every evening | ORAL | Status: DC | PRN
  Administered 2018-10-10 – 2018-10-11 (×2): 100 mg via ORAL
  Filled 2018-10-09 (×2): qty 1

## 2018-10-09 MED ORDER — ACETAMINOPHEN 325 MG PO TABS: 650.0000 mg | ORAL_TABLET | Freq: Four times a day (QID) | ORAL | Status: DC | PRN

## 2018-10-09 MED ORDER — ALUM & MAG HYDROXIDE-SIMETH 200-200-20 MG/5ML PO SUSP: 30.0000 mL | ORAL | Status: DC | PRN

## 2018-10-09 MED ORDER — MAGNESIUM HYDROXIDE 400 MG/5ML PO SUSP: 30.0000 mL | Freq: Every day | ORAL | Status: DC | PRN

## 2018-10-09 MED ORDER — QUETIAPINE FUMARATE 200 MG PO TABS
200.0000 mg | ORAL_TABLET | Freq: Two times a day (BID) | ORAL | Status: DC
  Administered 2018-10-10 – 2018-10-12 (×5): 200 mg via ORAL
  Filled 2018-10-09 (×5): qty 1

## 2018-10-09 MED ORDER — DIVALPROEX SODIUM 500 MG PO DR TAB
500.0000 mg | DELAYED_RELEASE_TABLET | Freq: Two times a day (BID) | ORAL | Status: DC
  Administered 2018-10-10 – 2018-10-18 (×17): 500 mg via ORAL
  Filled 2018-10-09 (×17): qty 1

## 2018-10-09 NOTE — ED Notes (Signed)
Dr Hinton Dyer in to see pt

## 2018-10-09 NOTE — ED Notes (Signed)
Patient observed lying in bed with eyes closed  Even, unlabored respirations observed   NAD pt appears to be sleeping  I will continue to monitor along with every 15 minute visual observations and ongoing security monitoring    

## 2018-10-09 NOTE — ED Notes (Signed)
BEHAVIORAL HEALTH ROUNDING Patient sleeping: Yes.   Patient alert and oriented: eyes closed  Appears to be asleep Behavior appropriate: Yes.  ; If no, describe:  Nutrition and fluids offered: Yes  Toileting and hygiene offered: sleeping Sitter present: q 15 minute observations and security monitoring Law enforcement present: yes  ODS 

## 2018-10-09 NOTE — ED Notes (Signed)
BEHAVIORAL HEALTH ROUNDING Patient sleeping: No. Patient alert and oriented: yes Behavior appropriate: Yes.  ; If no, describe:  Nutrition and fluids offered: yes Toileting and hygiene offered: Yes  Sitter present: q15 minute observations and security  monitoring Law enforcement present: Yes  ODS  

## 2018-10-09 NOTE — BH Assessment (Signed)
Patient seen by [facility] and recommended for inpatient treatment. Forward information to [facility] BMU rounding physician (Dr. Jennet Maduro) and she will put in admission orders.  Patient is to be admitted to [facility] by Dr. Jennet Maduro.  Attending Physician will be Dr. Jennet Maduro.   Patient has been assigned to room 301, by [facility] Charge Nurse Lorenda Hatchet.   Intake Paper Work has been signed and placed on patient chart.  ER staff is aware of the admission:  French Ana, ER Secretary    Dr. Alphonzo Lemmings, ER MD   Clydie Braun, Patient's Nurse   Marylene Land, Patient Access.

## 2018-10-09 NOTE — ED Notes (Signed)
Pt resting quietly - we will move once he awakens

## 2018-10-09 NOTE — ED Notes (Signed)
Per Gardiner Coins. TTS, pt is to transfer after during night shift to BMU room 301.

## 2018-10-09 NOTE — ED Notes (Signed)
Upon transfer to [facility], patient indicated he wanted to rest and did not want to talk at length. He denied SI, HI, and AVH but endorsed paranoia. He declined to elaborate on the latter. He requested and was given ginger ale. He is now resting in his room. Pt was oriented to unit, and he declined any immediate needs, questions, or concerns. Will continue to monitor for needs/safety.

## 2018-10-09 NOTE — ED Notes (Signed)
Pt to move to BHU at this time

## 2018-10-09 NOTE — ED Notes (Signed)
Pt slow to respond - difficult at times to talk with him because he gets agitated when he cannot find the words to answer my question  Pt rubbing his head- face and then asks  "What did you ask me - Why did you ask me for my phone number?"   Reported to him that he requested a phone and I was going to help him make his call due to he must dial 9 then his number  - number written on post it so that he may keep  Assessment completed  Anxiety present  He denied pain

## 2018-10-09 NOTE — ED Notes (Signed)

## 2018-10-09 NOTE — ED Notes (Signed)

## 2018-10-09 NOTE — Consult Note (Signed)
Will admit to psychiatry. Orders entered.

## 2018-10-09 NOTE — BH Assessment (Signed)
Writer spoke with patient to complete an updated/reassessment. Patient was able to provide appropriate answers to questions. He admits to substances use and approximately six years ago, he had attempted to end in his life by overdosing on his medications. Patient currently denies SI/HI and AV/H. During the interview, patient was having thought blocking and at times, he would softly repeat the question, just above a whisper, before answering. Patient repeatedly apologize for the delay in answering questions and states it's because he just woke up. He told this writer this isn't normal for him. However, since patient been in the ER, his responses have been slow and delayed. He continue to be disorganized and confused. Patient was polite and pleasant.

## 2018-10-09 NOTE — ED Notes (Signed)
Patient received PM snack. 

## 2018-10-09 NOTE — ED Notes (Signed)
[facility] referral for inpt treatment

## 2018-10-09 NOTE — ED Notes (Signed)
ED Is the patient under IVC or is there intent for IVC:  voluntary  Is the patient medically cleared: Yes.   Is there vacancy in the ED BHU: Yes.   Is the population mix appropriate for patient: Yes.   Is the patient awaiting placement in inpatient or outpatient setting: Yes.   Has the patient had a psychiatric consult: Yes.  Soc referral for inpt treatment  Survey of unit performed for contraband, proper placement and condition of furniture, tampering with fixtures in bathroom, shower, and each patient room: Yes.  ; Findings:  APPEARANCE/BEHAVIOR Calm and cooperative NEURO ASSESSMENT Orientation: oriented x3  Denies pain Hallucinations: No.None noted (Hallucinations) denies  Speech: Normal Gait: normal RESPIRATORY ASSESSMENT Even  Unlabored respirations  CARDIOVASCULAR ASSESSMENT Pulses equal   regular rate  Skin warm and dry   GASTROINTESTINAL ASSESSMENT no GI complaint EXTREMITIES Full ROM  PLAN OF CARE Provide calm/safe environment. Vital signs assessed twice daily. ED BHU Assessment once each 12-hour shift. Collaborate with TTS when available  or as condition indicates. Assure the ED provider has rounded once each shift. Provide and encourage hygiene. Provide redirection as needed. Assess for escalating behavior; address immediately and inform ED provider.  Assess family dynamic and appropriateness for visitation as needed: Yes.  ; If necessary, describe findings:  Educate the patient/family about BHU procedures/visitation: Yes.  ; If necessary, describe findings:

## 2018-10-09 NOTE — ED Provider Notes (Signed)
-----------------------------------------   3:38 AM on 10/09/2018 -----------------------------------------   Blood pressure (!) 138/96, pulse (!) 124, temperature 98.2 F (36.8 C), temperature source Oral, resp. rate 20, height 6\' 2"  (1.88 m), weight 89.4 kg, SpO2 100 %.  The patient had no acute events since last update.  Calm and cooperative at this time.  SOC recommends inpatient.   , MD 10/09/18 [phone]

## 2018-10-10 ENCOUNTER — Other Ambulatory Visit: Payer: Self-pay

## 2018-10-10 ENCOUNTER — Encounter: Payer: Self-pay | Admitting: Psychiatry

## 2018-10-10 DIAGNOSIS — F251 Schizoaffective disorder, depressive type: Secondary | ICD-10-CM

## 2018-10-10 LAB — LIPID PANEL
CHOL/HDL RATIO: 5.8 ratio
CHOLESTEROL: 191 mg/dL (ref 0–200)
HDL: 33 mg/dL — AB (ref >40)
LDL Cholesterol: 141 mg/dL — ABNORMAL HIGH (ref 0–99)
TRIGLYCERIDES: 83 mg/dL (ref <150)
VLDL: 17 mg/dL (ref 0–40)

## 2018-10-10 LAB — HEMOGLOBIN A1C
Hgb A1c MFr Bld: 5.2 % (ref 4.8–5.6)
Mean Plasma Glucose: 102.54 mg/dL

## 2018-10-10 LAB — TSH: TSH: 7.917 u[IU]/mL — ABNORMAL HIGH (ref 0.350–4.500)

## 2018-10-10 NOTE — [facility] Counselor (Signed)
Adult Comprehensive Assessment  Patient ID: Curtis Cobb, male   DOB: [birth year], 47 y.o.   MRN: [id number]  Information Source: Information source: Patient  Current Stressors:  Patient states their primary concerns and needs for treatment are:: "Not sure what else to do" Patient states their goals for this hospitilization and ongoing recovery are:: "proper medication, therapy would be helpful" Educational / Learning stressors: None reported Employment / Job issues: Unemployed Family Relationships: (Few supports) Surveyor, quantity / Lack of resources (include bankruptcy): Financial hardship, unemployed Housing / Lack of housing: Was living with a friend, unsure if he will be able to return to the home. Physical health (include injuries & life threatening diseases): (Hx of MRSA, contact precaution) Social relationships: Gets along with others. Substance abuse: Pt reports"really not that picky, what ever is available" Bereavement / Loss: Pt loss his mother and she was his only support.  Living/Environment/Situation:  Living Arrangements: Other (Comment)(Lives with a male friend, unsure if he will be able to return to the home.) Living conditions (as described by patient or guardian): Was living in a house with a friend and two other tenants. Has been living with friend since July 2019. Who else lives in the home?: Male Friend and two other tenants How long has patient lived in current situation?: Since July 2019 What is atmosphere in current home: Chaotic  Family History:  Marital status: Single Are you sexually active?: Yes What is your sexual orientation?: Heterosexual Has your sexual activity been affected by drugs, alcohol, medication, or emotional stress?: Yes  Does patient have children?: No  Childhood History:  By whom was/is the patient raised?: Mother Additional childhood history information: (None reported) Description of patient's relationship with caregiver when they were  a child: "pretty awesome relationship" Patient's description of current relationship with people who raised him/her: Mother deceased How were you disciplined when you got in trouble as a child/adolescent?: Time out, spankings Does patient have siblings?: No Did patient suffer any verbal/emotional/physical/sexual abuse as a child?: No Did patient suffer from severe childhood neglect?: No Has patient ever been sexually abused/assaulted/raped as an adolescent or adult?: No Was the patient ever a victim of a crime or a disaster?: No Witnessed domestic violence?: No Has patient been effected by domestic violence as an adult?: No  Education:  Highest grade of school patient has completed: 11th grade Currently a student?: No Learning disability?: Yes(Challenges in reading and math) 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?: Challenges in math  Employment/Work Situation:   Employment situation: Unemployed Patient's job has been impacted by current illness: Yes(Previously worked in Aeronautical engineer) Describe how patient's job has been impacted: Difficulty maintaining stable employment What is the longest time patient has a held a job?: 16 months Where was the patient employed at that time?: Unable to recall Did You Receive Any Psychiatric Treatment/Services While in the [employer]?: No Are There Guns or Other Weapons in Your Home?: No Are These Comptroller?: Yes(Pt denies any guns or weapons in the home)  Financial Resources:   Financial resources: No income Does patient have a Lawyer or guardian?: No  Alcohol/Substance Abuse:   What has been your use of drugs/alcohol within the last 12 months?: Amphetamines, THC If attempted suicide, did drugs/alcohol play a role in this?: No Alcohol/Substance Abuse Treatment Hx: Past Tx, Inpatient, Relapse prevention program If yes, describe treatment: Unable to recall details of treatment Has alcohol/substance abuse ever caused legal  problems?: No  Social Support System:   Forensic psychologist  System: Poor Describe Community Support System: (Pt reports mother was his greatest support but she is deceased.) Type of faith/religion: None reported How does patient's faith help to cope with current illness?: None reported  Leisure/Recreation:   Leisure and Hobbies: Museum/gallery exhibitions officer, camping, fishing  Strengths/Needs:   What is the patient's perception of their strengths?: "Can't think of any right now" Patient states they can use these personal strengths during their treatment to contribute to their recovery: None reported Patient states these barriers may affect/interfere with their treatment: None reported Patient states these barriers may affect their return to the community: None reported Other important information patient would like considered in planning for their treatment: None reported  Discharge Plan:   Currently receiving community mental health services: No Patient states concerns and preferences for aftercare planning are: Pt reports unsure about after care plan at this time. CSW will assess to link pt to necessary community resources upon discharge. Patient states they will know when they are safe and ready for discharge when: Attended group and received medication management Does patient have access to transportation?: No Does patient have financial barriers related to discharge medications?: Yes Patient description of barriers related to discharge medications: No income Plan for no access to transportation at discharge: (CSW will assess for transportation needs at discharge) Will patient be returning to same living situation after discharge?: (Yes, pt is unsure if he will be able to return to his current living situation)  Summary/Recommendations:   Summary and Recommendations (to be completed by the evaluator):  Patient is a 47 year old Caucasian male admitted involuntarily and diagnosed with Anxiety and  Substance Use Disorder. Pt has a past medical history of anxiety, depression, substance use, presents to the emergency department for "a panic attack."  Per nurse report pt was brought to the emergency department by his girlfriend which required significant coaxing. At discharge, patient wants to return home and attend outpatient treatment. While here, patient will benefit from crisis stabilization, medication evaluation, group therapy and psychoeducation, in addition to case management for discharge planning. At discharge, it is recommended that patient remain compliant with the established discharge plan and continue treatment.   Curtis Cobb. 10/10/2018

## 2018-10-10 NOTE — H&P (Signed)
Psychiatric Admission Assessment Adult  Patient Identification: Curtis Cobb MRN:  [id number] Date of Evaluation:  10/10/2018 Chief Complaint:  Severe recurrent major depression with psychotic features Principal Diagnosis: Schizoaffective disorder, depressive type (HCC) Diagnosis:   Patient Active Problem List   Diagnosis Date Noted  . Schizoaffective disorder, depressive type (HCC) [F25.1]     Priority: High  . Tobacco use disorder [F17.200] 10/09/2018  . Cellulitis [L03.90] 08/23/2018  . 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 Sedative, hypnotic or anxiolytic use disorder, severe, dependence (HCC) [F13.20] 03/30/2016  . MDD (major depressive disorder), recurrent episode, severe (HCC) [F33.2] 11/17/2015  . Opioid use disorder, mild, in sustained remission (HCC) [F11.11] 10/08/2015  . Alcohol use disorder, severe, dependence (HCC) [F10.20] 10/07/2015  . Cannabis use disorder, moderate, dependence (HCC) [F12.20] 10/07/2015  . HTN (hypertension) [I10] 10/07/2015  . Rheumatoid arthritis (HCC) [M06.9] 10/07/2015   History of Present Illness:   Identifying data. Curtis Cobb is a 47 year old male with a history of schizoaffective disorder.  Chief complaint. The patient unable to state.  History of present illness. Information was obtained from the patient and the chart. The patient was brought to the ER by his girlfriend "to get help". The patient was initially unable to provide any sensible informations. He was very frightened and actively hallucinating, with both visual and auditory hallucinations. There was tremendous latency of response and inability to focus on simple issues. He admitted being noncompliant with treatment, abusing substances "recreationally", and wanting "psychotherapy" for his problems. He is no suicidal or homicida. UDS positive for amphetamines and cannabis.  Past psychiatric history. Prior hospitalizations for depressoin, substances and  bipolar.  Family psychiatric history. None reported.  Socisl history. Has support from his girlfriend and her mother.   Total Time spent with patient: 1 hour  Is the patient at risk to self? No.  Has the patient been a risk to self in the past 6 months? No.  Has the patient been a risk to self within the distant past? No.  Is the patient a risk to others? No.  Has the patient been a risk to others in the past 6 months? No.  Has the patient been a risk to others within the distant past? No.   Prior Inpatient Therapy:   Prior Outpatient Therapy:    Alcohol Screening: Patient refused Alcohol Screening Tool: Yes 1. How often do you have a drink containing alcohol?: Never 2. How many drinks containing alcohol do you have on a typical day when you are drinking?: 1 or 2 3. How often do you have six or more drinks on one occasion?: Never AUDIT-C Score: 0 4. How often during the last year have you found that you were not able to stop drinking once you had started?: Never 5. How often during the last year have you failed to do what was normally expected from you becasue of drinking?: Never 6. How often during the last year have you needed a first drink in the morning to get yourself going after a heavy drinking session?: Never 7. How often during the last year have you had a feeling of guilt of remorse after drinking?: Never 8. How often during the last year have you been unable to remember what happened the night before because you had been drinking?: Never 9. Have you or someone else been injured as a result of your drinking?: No 10. Has a relative or friend or a doctor or another health worker been concerned about your  drinking or suggested you cut down?: No Alcohol Use Disorder Identification Test Final Score (AUDIT): 0 Intervention/Follow-up: Continued Monitoring Substance Abuse History in the last 12 months:  Yes.   Consequences of Substance Abuse: Negative Previous Psychotropic  Medications: Yes  Psychological Evaluations: No  Past Medical History:  Past Medical History:  Diagnosis Date  . Alcohol use disorder, moderate, dependence (HCC) 02/13/2014  . Anxiety   . Depression   . Hypertension   . Rheumatoid arthritis (HCC)   . Substance abuse [facility])     Past Surgical History:  Procedure Laterality Date  . INCISION AND DRAINAGE ABSCESS Right 08/25/2018   Procedure: INCISION AND DRAINAGE ABSCESS;  Surgeon: Christena Flake, MD;  Location: [facility] ORS;  Service: Orthopedics;  Laterality: Right;  . INCISION AND DRAINAGE ABSCESS Right 08/30/2018   Procedure: INCISION AND DRAINAGE ABSCESS;  Surgeon: Christena Flake, MD;  Location: [facility] ORS;  Service: Orthopedics;  Laterality: Right;  . none     Family History:  Family History  Problem Relation Age of Onset  . Heart attack Mother   . Stroke Mother   . Hypertension Mother   . Mental illness Mother        Manic Depressive Bipolar Disorder  . Heart disease Mother   . Hypertension Maternal Aunt   . Mental illness Maternal Aunt   . Hypertension Maternal Aunt   . Mental illness Maternal Aunt     Tobacco Screening: Have you used any form of tobacco in the last 30 days? (Cigarettes, Smokeless Tobacco, Cigars, and/or Pipes): No Social History:  Social History   Substance and Sexual Activity  Alcohol Use Yes   Comment: drank last pm      Social History   Substance and Sexual Activity  Drug Use No  . Types: Amphetamines, Marijuana, Cocaine, Heroin   Comment: one month ago?     Additional Social History: Marital status: Single Are you sexually active?: Yes What is your sexual orientation?: Heterosexual Has your sexual activity been affected by drugs, alcohol, medication, or emotional stress?: Yes  Does patient have children?: No                         Allergies:   Allergies  Allergen Reactions  . Sulfa Antibiotics Other (See Comments)    Unknown reaction   Lab Results: No results found for this or  any previous visit (from the past 48 hour(s)).  Blood Alcohol level:  Lab Results  Component Value Date   ETH <10 10/08/2018   ETH <5 07/09/2016    Metabolic Disorder Labs:  Lab Results  Component Value Date   HGBA1C 5.2 10/08/2018   MPG 102.54 10/08/2018   Lab Results  Component Value Date   PROLACTIN 13.1 07/10/2016   Lab Results  Component Value Date   CHOL 191 10/08/2018   TRIG 83 10/08/2018   HDL 33 (L) 10/08/2018   CHOLHDL 5.8 10/08/2018   VLDL 17 10/08/2018   LDLCALC 141 (H) 10/08/2018   LDLCALC 98 07/10/2016    Current Medications: Current Facility-Administered Medications  Medication Dose Route Frequency Provider Last Rate Last Dose  . acetaminophen (TYLENOL) tablet 650 mg  650 mg Oral Q6H PRN Stephaniemarie Stoffel B, MD      . alum & mag hydroxide-simeth (MAALOX/MYLANTA) 200-200-20 MG/5ML suspension 30 mL  30 mL Oral Q4H PRN Daejah Klebba B, MD      . divalproex (DEPAKOTE) DR tablet 500 mg  500 mg Oral  Q12H Patrick Salemi, Braulio Conte B, MD   500 mg at 10/10/18 0819  . folic acid (FOLVITE) tablet 1 mg  1 mg Oral Daily Eural Holzschuh B, MD   1 mg at 10/10/18 0819  . magnesium hydroxide (MILK OF MAGNESIA) suspension 30 mL  30 mL Oral Daily PRN Joyceline Maiorino B, MD      . nicotine (NICODERM CQ - dosed in mg/24 hours) patch 21 mg  21 mg Transdermal Q0600 Asanti Craigo B, MD   21 mg at 10/10/18 0818  . QUEtiapine (SEROQUEL) tablet 200 mg  200 mg Oral BID Brenlyn Beshara B, MD   200 mg at 10/10/18 1613  . traZODone (DESYREL) tablet 100 mg  100 mg Oral QHS PRN Sharonann Malbrough B, MD       PTA Medications: Medications Prior to Admission  Medication Sig Dispense Refill Last Dose  . diclofenac (VOLTAREN) 75 MG EC tablet Take 1 tablet (75 mg total) by mouth 2 (two) times daily as needed. (Patient not taking: Reported on 08/23/2018) 60 tablet 0 Not Taking at Unknown time  . ibuprofen (ADVIL,MOTRIN) 400 MG tablet Take 1 tablet (400 mg total) by mouth every  8 (eight) hours as needed. (Patient not taking: Reported on 08/23/2018) 15 tablet 0 Not Taking at Unknown time  . LORazepam (ATIVAN) 2 MG tablet Take 2 mg by mouth at bedtime as needed. for sleep  1 prn at prn  . oxyCODONE (OXY IR/ROXICODONE) 5 MG immediate release tablet Take 1-2 tablets (5-10 mg total) by mouth every 6 (six) hours as needed for moderate pain (pain score 4-6). 20 tablet 0   . QUEtiapine (SEROQUEL) 300 MG tablet Take 1 tablet (300 mg total) by mouth at bedtime. 30 tablet 0 08/22/2018 at Unknown time    Musculoskeletal: Strength & Muscle Tone: within normal limits Gait & Station: normal Patient leans: N/A  Psychiatric Specialty Exam: Physical Exam  Nursing note and vitals reviewed. Constitutional: He is oriented to person, place, and time. He appears well-developed and well-nourished.  HENT:  Head: Normocephalic and atraumatic.  Eyes: Pupils are equal, round, and reactive to light. Conjunctivae and EOM are normal.  Neck: Normal range of motion. Neck supple.  Cardiovascular: Normal rate, regular rhythm and normal heart sounds.  Respiratory: Effort normal and breath sounds normal.  GI: Soft. Bowel sounds are normal.  Musculoskeletal: Normal range of motion.  Neurological: He is alert and oriented to person, place, and time.  Skin: Skin is warm and dry.  Psychiatric: His mood appears anxious. His affect is blunt. His speech is delayed. He is slowed, withdrawn and actively hallucinating. Thought content is paranoid and delusional. Cognition and memory are impaired. He expresses impulsivity.    Review of Systems  Neurological: Negative.   Psychiatric/Behavioral: Positive for depression and substance abuse.  All other systems reviewed and are negative.   Blood pressure 105/84, pulse 100, temperature 98.6 F (37 C), temperature source Oral, resp. rate 16, height 6\' 2"  (1.88 m), weight 82.6 kg, SpO2 98 %.Body mass index is 23.37 kg/m.   Other:     Treatment plan:  Mr.  Russey is a 47 year old male with a history of bipolar disorder and substance abuse admitted for psychotic break in the context of substance use and treatment noncompliance.  #Psychosis -restart Seroquel 300 mg nightly -start Depakote 500 mg BID -Trazodone 100 mg nightly  #Substance abuse -positve for amphetamines and cannabis -minimizes problems as recreational, declines treatment  #Smoking cessation -nicotine patch is available  #Labs -lipid panel,  TSH and A1C  -TSH  #Disposition -discharge with his girlfriend -follow up with [facility]   Physician Treatment Plan for Primary Diagnosis: Schizoaffective disorder, depressive type (HCC) Long Term Goal(s): Improvement in symptoms so as ready for discharge  Short Term Goals: Ability to identify changes in lifestyle to reduce recurrence of condition will improve, Ability to verbalize feelings will improve, Ability to disclose and discuss suicidal ideas, Ability to demonstrate self-control will improve, Ability to identify and develop effective coping behaviors will improve, Ability to maintain clinical measurements within normal limits will improve, Compliance with prescribed medications will improve and Ability to identify triggers associated with substance abuse/mental health issues will improve  Physician Treatment Plan for Secondary Diagnosis: Principal Problem:   Schizoaffective disorder, depressive type (HCC) Active Problems:   Cannabis use disorder, moderate, dependence (HCC)   Tobacco use disorder  Long Term Goal(s): Improvement in symptoms so as ready for discharge  Short Term Goals: Ability to identify changes in lifestyle to reduce recurrence of condition will improve, Ability to demonstrate self-control will improve and Ability to identify triggers associated with substance abuse/mental health issues will improve  I certify that inpatient services furnished can reasonably be expected to improve the patient's condition.     Kristine Linea, MD 10/14/20194:51 PM

## 2018-10-10 NOTE — Progress Notes (Signed)
Patient ID: Curtis Cobb, male   DOB: 1971-07-15, 47 y.o.   MRN: [id number]  Patient is a 47 year old male presenting voluntarily with chief complaint of increased anxiety and panic attacks. Presents with a long history of substance abuse, primarily abusing amphetamines and cannabis. Presented to ED with altered  Mental status. Currently thought process  Is more organized. Presents with history of MDD as well. Denying thoughts of self harm. Denying hallucinations.  Currently no withdrawal symptoms. Skin assessment performed by this Clinical research associate, staffed with Aurther Loft, RN. No skin issues notes. Admitted and oriented to the unit. Safety precautions initiated. To be evaluated by attending MD in AM.

## 2018-10-10 NOTE — Plan of Care (Signed)
Patient  knowledgeable of information received , verbalized understanding  of information received . Able to attend Treatment  Team  discussed  understanding of diagnoses  and goal for  being here.  Aware of life style  changes Voice of no safety concerns  Encourage good hand  washing   ] Problem: Education: Goal: Knowledge of [facility] Education information/materials will improve Outcome: Progressing   Problem: Education: Goal: Knowledge of disease or condition will improve Outcome: Progressing Goal: Understanding of discharge needs will improve Outcome: Progressing   Problem: Health Behavior/Discharge Planning: Goal: Ability to identify changes in lifestyle to reduce recurrence of condition will improve Outcome: Progressing Goal: Identification of resources available to assist in meeting health care needs will improve Outcome: Progressing   Problem: Physical Regulation: Goal: Complications related to the disease process, condition or treatment will be avoided or minimized Outcome: Progressing   Problem: Safety: Goal: Ability to remain free from injury will improve Outcome: Progressing   

## 2018-10-10 NOTE — [facility] Group Notes (Signed)
  LCSW Group Therapy Note Monday October 14/2019  Type of Therapy and Topic:  Group Therapy:  Overcoming Obstacles   Participation Level:  DID NOT ATTEND   Description of Group:   In this group patients will be encouraged to explore what they see as obstacles to their own wellness and recovery. They will be guided to discuss their thoughts, feelings, and behaviors related to these obstacles. The group will process together ways to cope with barriers, with attention given to specific choices patients can make. Each patient will be challenged to identify changes they are motivated to make in order to overcome their obstacles. This group will be process-oriented, with patients participating in exploration of their own experiences, giving and receiving support, and processing challenge from other group members. 

## 2018-10-10 NOTE — Progress Notes (Signed)
D: Patient stated slept good last night .Stated appetite is good and energy level  Is normal. Stated concentration is good . Stated he is Depressed Denies suicidal  homicidal ideations  .  No auditory hallucinations  No pain concerns . Appropriate ADL'S. Interacting with peers and staff. Patient  knowledgeable of information received , verbalized understanding  of information received . Able to attend Treatment  Team  discussed  understanding of diagnoses  and goal for  being here.  Aware of life style  changes Voice of no safety concerns  Encourage good hand  washing   A: Encourage patient participation with unit programming . Instruction  Given on  Medication , verbalize understanding.    R: Voice no other concerns. Staff continue to monitor

## 2018-10-10 NOTE — Progress Notes (Addendum)
[facility] MD Progress Note  10/11/2018 10:00 AM Curtis Cobb  MRN:  [id number]  Subjective:   Curtis Cobb reports feeling better but seems to be rather disorganized in his thinking, unable to answer simple questions. He credits it to very high anxiety. He feels depressed abut not suicidal but feels incapacitated by fear of another panic attack. He took his medications, reports no side effects. Remembers little from the time of admission. Admits that he used crystal meth prior to admission which likely precipitated panic attack. He minimizes drug use.  Spoke with Curtis Cobb, [facility] representative about [facility] IOP program participation.   Principal Problem: Schizoaffective disorder, depressive type (HCC) Diagnosis:   Patient Active Problem List   Diagnosis Date Noted  . Schizoaffective disorder, depressive type (HCC) [F25.1]     Priority: High  . Tobacco use disorder [F17.200] 10/09/2018  . Cellulitis [L03.90] 08/23/2018  . 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 Sedative, hypnotic or anxiolytic use disorder, severe, dependence (HCC) [F13.20] 03/30/2016  . MDD (major depressive disorder), recurrent episode, severe (HCC) [F33.2] 11/17/2015  . Opioid use disorder, mild, in sustained remission (HCC) [F11.11] 10/08/2015  . Alcohol use disorder, severe, dependence (HCC) [F10.20] 10/07/2015  . Cannabis use disorder, moderate, dependence (HCC) [F12.20] 10/07/2015  . HTN (hypertension) [I10] 10/07/2015  . Rheumatoid arthritis (HCC) [M06.9] 10/07/2015   Total Time spent with patient: 20 minutes  Past Psychiatric History: bipolar disorder  Past Medical History:  Past Medical History:  Diagnosis Date  . Alcohol use disorder, moderate, dependence (HCC) 02/13/2014  . Anxiety   . Depression   . Hypertension   . Rheumatoid arthritis (HCC)   . Substance abuse [facility])     Past Surgical History:  Procedure Laterality Date  . INCISION AND DRAINAGE ABSCESS Right 08/25/2018   Procedure:  INCISION AND DRAINAGE ABSCESS;  Surgeon: Christena Flake, MD;  Location: [facility] ORS;  Service: Orthopedics;  Laterality: Right;  . INCISION AND DRAINAGE ABSCESS Right 08/30/2018   Procedure: INCISION AND DRAINAGE ABSCESS;  Surgeon: Christena Flake, MD;  Location: [facility] ORS;  Service: Orthopedics;  Laterality: Right;  . none     Family History:  Family History  Problem Relation Age of Onset  . Heart attack Mother   . Stroke Mother   . Hypertension Mother   . Mental illness Mother        Manic Depressive Bipolar Disorder  . Heart disease Mother   . Hypertension Maternal Aunt   . Mental illness Maternal Aunt   . Hypertension Maternal Aunt   . Mental illness Maternal Aunt    Family Psychiatric  History: none Social History:  Social History   Substance and Sexual Activity  Alcohol Use Yes   Comment: drank last pm      Social History   Substance and Sexual Activity  Drug Use No  . Types: Amphetamines, Marijuana, Cocaine, Heroin   Comment: one month ago?     Social History   Socioeconomic History  . Marital status: Single    Spouse name: Not on file  . Number of children: Not on file  . Years of education: Not on file  . Highest education level: Not on file  Occupational History  . Not on file  Social Needs  . Financial resource strain: Not on file  . Food insecurity:    Worry: Not on file    Inability: Not on file  . Transportation needs:    Medical: Not on file  Non-medical: Not on file  Tobacco Use  . Smoking status: Current Every Day Smoker    Packs/day: 0.50    Years: 0.00    Pack years: 0.00  . Smokeless tobacco: Never Used  Substance and Sexual Activity  . Alcohol use: Yes    Comment: drank last pm   . Drug use: No    Types: Amphetamines, Marijuana, Cocaine, Heroin    Comment: one month ago?   Marland Kitchen Sexual activity: Yes    Birth control/protection: None  Lifestyle  . Physical activity:    Days per week: Not on file    Minutes per session: Not on file  .  Stress: Not on file  Relationships  . Social connections:    Talks on phone: Not on file    Gets together: Not on file    Attends religious service: Not on file    Active member of club or organization: Not on file    Attends meetings of clubs or organizations: Not on file    Relationship status: Not on file  Other Topics Concern  . Not on file  Social History Narrative  . Not on file   Additional Social History:                         Sleep: Fair  Appetite:  Fair  Current Medications: Current Facility-Administered Medications  Medication Dose Route Frequency Provider Last Rate Last Dose  . acetaminophen (TYLENOL) tablet 650 mg  650 mg Oral Q6H PRN Marjean Imperato B, MD      . alum & mag hydroxide-simeth (MAALOX/MYLANTA) 200-200-20 MG/5ML suspension 30 mL  30 mL Oral Q4H PRN Joevon Holliman B, MD      . divalproex (DEPAKOTE) DR tablet 500 mg  500 mg Oral Q12H Yosef Krogh B, MD   500 mg at 10/11/18 0813  . fluvoxaMINE (LUVOX) tablet 50 mg  50 mg Oral QHS Sahas Sluka B, MD      . folic acid (FOLVITE) tablet 1 mg  1 mg Oral Daily Mitzi Lilja B, MD   1 mg at 10/11/18 0813  . magnesium hydroxide (MILK OF MAGNESIA) suspension 30 mL  30 mL Oral Daily PRN Mrytle Bento B, MD      . nicotine (NICODERM CQ - dosed in mg/24 hours) patch 21 mg  21 mg Transdermal Q0600 Moustapha Tooker B, MD   21 mg at 10/11/18 0815  . prazosin (MINIPRESS) capsule 1 mg  1 mg Oral Once Deshae Dickison B, MD      . QUEtiapine (SEROQUEL) tablet 200 mg  200 mg Oral BID Delbra Zellars B, MD   200 mg at 10/11/18 0813  . traZODone (DESYREL) tablet 100 mg  100 mg Oral QHS PRN Lilla Callejo B, MD   100 mg at 10/10/18 2056    Lab Results: No results found for this or any previous visit (from the past 48 hour(s)).  Blood Alcohol level:  Lab Results  Component Value Date   ETH <10 10/08/2018   ETH <5 07/09/2016    Metabolic Disorder Labs: Lab  Results  Component Value Date   HGBA1C 5.2 10/08/2018   MPG 102.54 10/08/2018   Lab Results  Component Value Date   PROLACTIN 13.1 07/10/2016   Lab Results  Component Value Date   CHOL 191 10/08/2018   TRIG 83 10/08/2018   HDL 33 (L) 10/08/2018   CHOLHDL 5.8 10/08/2018   VLDL 17 10/08/2018   LDLCALC 141 (  H) 10/08/2018   LDLCALC 98 07/10/2016    Physical Findings: AIMS: Facial and Oral Movements Muscles of Facial Expression: None, normal Lips and Perioral Area: None, normal Jaw: None, normal Tongue: None, normal,Extremity Movements Upper (arms, wrists, hands, fingers): None, normal Lower (legs, knees, ankles, toes): None, normal, Trunk Movements Neck, shoulders, hips: None, normal, Overall Severity Severity of abnormal movements (highest score from questions above): None, normal Incapacitation due to abnormal movements: None, normal Patient's awareness of abnormal movements (rate only patient's report): No Awareness, Dental Status Current problems with teeth and/or dentures?: No Does patient usually wear dentures?: No  CIWA:  CIWA-Ar Total: 0 COWS:  COWS Total Score: 1  Musculoskeletal: Strength & Muscle Tone: within normal limits Gait & Station: normal Patient leans: N/A  Psychiatric Specialty Exam: Physical Exam  Nursing note and vitals reviewed. Psychiatric: His affect is blunt. His speech is delayed. He is slowed, withdrawn and actively hallucinating. Thought content is delusional. Cognition and memory are impaired. He expresses impulsivity.    Review of Systems  Neurological: Negative.   Psychiatric/Behavioral: Positive for depression. The patient is nervous/anxious.   All other systems reviewed and are negative.   Blood pressure 115/68, pulse 68, temperature 97.7 F (36.5 C), temperature source Oral, resp. rate 16, height 6\' 2"  (1.88 m), weight 82.6 kg, SpO2 100 %.Body mass index is 23.37 kg/m.  General Appearance: Fairly Groomed  Eye Contact:  Fair   Speech:  Slow  Volume:  Decreased  Mood:  Anxious  Affect:  Flat  Thought Process:  Disorganized, Linear and Descriptions of Associations: Tangential  Orientation:  Full (Time, Place, and Person)  Thought Content:  Delusions, Hallucinations: Auditory and Paranoid Ideation  Suicidal Thoughts:  No  Homicidal Thoughts:  No  Memory:  Immediate;   Poor Recent;   Poor Remote;   Poor  Judgement:  Poor  Insight:  Lacking  Psychomotor Activity:  Decreased  Concentration:  Concentration: Poor and Attention Span: Poor  Recall:  Poor  Fund of Knowledge:  Fair  Language:  Fair  Akathisia:  No  Handed:  Right  AIMS (if indicated):     Assets:  Communication Skills Desire for Improvement Financial Resources/Insurance Housing Intimacy Physical Health Resilience Social Support  ADL's:  Intact  Cognition:  WNL  Sleep:  Number of Hours: 9     Treatment Plan Summary: Daily contact with patient to assess and evaluate symptoms and progress in treatment and Medication management   Curtis Cobb is a 47 year old male with a history of bipolar disorder and substance abuse admitted for psychotic break in the context of substance use and treatment noncompliance.  #Mood and Psychosis -continue Seroquel 300 mg nightly -continue Depakote 500 mg BID -Trazodone 100 mg nightly  #Anxuety -start Luvox 50 mg nightly for OCD -consider Minipress for PTSD  #Substance abuse -positve for amphetamines and cannabis -minimizes problems as recreational, declines treatment  #Smoking cessation -nicotine patch is available  #Labs -lipid panel, TSH and A1C  -TSH  #Disposition -discharge with his girlfriend -follow up with [facility]  56, MD 10/11/2018, 10:00 AM

## 2018-10-10 NOTE — Tx Team (Signed)
Initial Treatment Plan 10/10/2018 1:43 AM [name] ZDG:[id number]    PATIENT STRESSORS: Medication change or noncompliance Substance abuse Other: anxiety   PATIENT STRENGTHS: Ability for insight Average or above average intelligence Capable of independent living General fund of knowledge   PATIENT IDENTIFIED PROBLEMS: Anxiety  Substance abuse                   DISCHARGE CRITERIA:  Ability to meet basic life and health needs Motivation to continue treatment in a less acute level of care Verbal commitment to aftercare and medication compliance Withdrawal symptoms are absent or subacute and managed without 24-hour nursing intervention  PRELIMINARY DISCHARGE PLAN: Outpatient therapy Return to previous living arrangement  PATIENT/FAMILY INVOLVEMENT: This treatment plan has been presented to and reviewed with the patient, Curtis Cobb.  The patient has  been given the opportunity to ask questions and make suggestions.  Olin Pia, RN 10/10/2018, 1:43 AM

## 2018-10-10 NOTE — Progress Notes (Signed)
   10/10/18 1030  Clinical Encounter Type  Visited With Patient  Visit Type Initial;Spiritual support;Behavioral Health  Referral From Social work  Consult/Referral To Chaplain  Spiritual Encounters  Spiritual Needs Emotional;Other (Comment)   CH attended the patient's treatment team meeting. Curtis Cobb stated that he had been off his medication for a while and was unable to sleep. He is now catching up on sleep. The patient stated that he wanted to learn better coping skills and therapy to help him understand why this process of relapse keeps happening.

## 2018-10-10 NOTE — Plan of Care (Signed)
  Problem: Education: Goal: Knowledge of [facility] Education information/materials will improve Outcome: Progressing   Problem: Education: Goal: Knowledge of disease or condition will improve Outcome: Progressing Goal: Understanding of discharge needs will improve Outcome: Progressing   Problem: Health Behavior/Discharge Planning: Goal: Ability to identify changes in lifestyle to reduce recurrence of condition will improve Outcome: Progressing Goal: Identification of resources available to assist in meeting health care needs will improve Outcome: Progressing   Problem: Physical Regulation: Goal: Complications related to the disease process, condition or treatment will be avoided or minimized Outcome: Progressing   Problem: Safety: Goal: Ability to remain free from injury will improve Outcome: Progressing

## 2018-10-10 NOTE — Tx Team (Addendum)
Interdisciplinary Treatment and Diagnostic Plan Update  10/10/2018 Time of Session: 10;30am Curtis Cobb MRN: [id number]  Principal Diagnosis: <principal problem not specified>  Secondary Diagnoses: Active Problems:   Schizoaffective disorder, depressive type (HCC)   Current Medications:  Current Facility-Administered Medications  Medication Dose Route Frequency Provider Last Rate Last Dose  . acetaminophen (TYLENOL) tablet 650 mg  650 mg Oral Q6H PRN Pucilowska, Jolanta B, MD      . alum & mag hydroxide-simeth (MAALOX/MYLANTA) 200-200-20 MG/5ML suspension 30 mL  30 mL Oral Q4H PRN Pucilowska, Jolanta B, MD      . divalproex (DEPAKOTE) DR tablet 500 mg  500 mg Oral Q12H Pucilowska, Jolanta B, MD   500 mg at 10/10/18 0819  . folic acid (FOLVITE) tablet 1 mg  1 mg Oral Daily Pucilowska, Jolanta B, MD   1 mg at 10/10/18 0819  . magnesium hydroxide (MILK OF MAGNESIA) suspension 30 mL  30 mL Oral Daily PRN Pucilowska, Jolanta B, MD      . nicotine (NICODERM CQ - dosed in mg/24 hours) patch 21 mg  21 mg Transdermal Q0600 Pucilowska, Jolanta B, MD   21 mg at 10/10/18 0818  . QUEtiapine (SEROQUEL) tablet 200 mg  200 mg Oral BID Pucilowska, Jolanta B, MD   200 mg at 10/10/18 0819  . traZODone (DESYREL) tablet 100 mg  100 mg Oral QHS PRN Pucilowska, Jolanta B, MD       PTA Medications: Medications Prior to Admission  Medication Sig Dispense Refill Last Dose  . diclofenac (VOLTAREN) 75 MG EC tablet Take 1 tablet (75 mg total) by mouth 2 (two) times daily as needed. (Patient not taking: Reported on 08/23/2018) 60 tablet 0 Not Taking at Unknown time  . ibuprofen (ADVIL,MOTRIN) 400 MG tablet Take 1 tablet (400 mg total) by mouth every 8 (eight) hours as needed. (Patient not taking: Reported on 08/23/2018) 15 tablet 0 Not Taking at Unknown time  . LORazepam (ATIVAN) 2 MG tablet Take 2 mg by mouth at bedtime as needed. for sleep  1 prn at prn  . oxyCODONE (OXY IR/ROXICODONE) 5 MG immediate  release tablet Take 1-2 tablets (5-10 mg total) by mouth every 6 (six) hours as needed for moderate pain (pain score 4-6). 20 tablet 0   . QUEtiapine (SEROQUEL) 300 MG tablet Take 1 tablet (300 mg total) by mouth at bedtime. 30 tablet 0 08/22/2018 at Unknown time    Patient Stressors: Medication change or noncompliance Substance abuse Other: anxiety  Patient Strengths: Ability for insight Average or above average intelligence Capable of independent living General fund of knowledge  Treatment Modalities: Medication Management, Group therapy, Case management,  1 to 1 session with clinician, Psychoeducation, Recreational therapy.   Physician Treatment Plan for Primary Diagnosis: <principal problem not specified> Long Term Goal(s):     Short Term Goals:    Medication Management: Evaluate patient's response, side effects, and tolerance of medication regimen.  Therapeutic Interventions: 1 to 1 sessions, Unit Group sessions and Medication administration.  Evaluation of Outcomes: Progressing  Physician Treatment Plan for Secondary Diagnosis: Active Problems:   Schizoaffective disorder, depressive type (HCC)  Long Term Goal(s):     Short Term Goals:       Medication Management: Evaluate patient's response, side effects, and tolerance of medication regimen.  Therapeutic Interventions: 1 to 1 sessions, Unit Group sessions and Medication administration.  Evaluation of Outcomes: Progressing   RN Treatment Plan for Primary Diagnosis: <principal problem not specified> Long Term Goal(s): Knowledge of  disease and therapeutic regimen to maintain health will improve  Short Term Goals: Ability to verbalize feelings will improve, Ability to identify and develop effective coping behaviors will improve and Compliance with prescribed medications will improve  Medication Management: RN will administer medications as ordered by provider, will assess and evaluate patient's response and provide  education to patient for prescribed medication. RN will report any adverse and/or side effects to prescribing provider.  Therapeutic Interventions: 1 on 1 counseling sessions, Psychoeducation, Medication administration, Evaluate responses to treatment, Monitor vital signs and CBGs as ordered, Perform/monitor CIWA, COWS, AIMS and Fall Risk screenings as ordered, Perform wound care treatments as ordered.  Evaluation of Outcomes: Progressing   LCSW Treatment Plan for Primary Diagnosis: <principal problem not specified> Long Term Goal(s): Safe transition to appropriate next level of care at discharge, Engage patient in therapeutic group addressing interpersonal concerns.  Short Term Goals: Engage patient in aftercare planning with referrals and resources, Increase social support, Facilitate patient progression through stages of change regarding substance use diagnoses and concerns, Identify triggers associated with mental health/substance abuse issues and Increase skills for wellness and recovery  Therapeutic Interventions: Assess for all discharge needs, 1 to 1 time with Social worker, Explore available resources and support systems, Assess for adequacy in community support network, Educate family and significant other(s) on suicide prevention, Complete Psychosocial Assessment, Interpersonal group therapy.  Evaluation of Outcomes: Progressing   Progress in Treatment: Attending groups: Yes. Participating in groups: Yes. Taking medication as prescribed: Yes. Toleration medication: Yes. Family/Significant other contact made: No, will contact:  Pt has not identified contact Patient understands diagnosis: Yes. Discussing patient identified problems/goals with staff: Yes. Medical problems stabilized or resolved: Yes. Denies suicidal/homicidal ideation: Yes. Issues/concerns per patient self-inventory: Yes. Other: substance use concerns reported.  New problem(s) identified: No, Describe:   NA  New Short Term/Long Term Goal(s):"To work on whatever can help me and evaluate what is going on to get sobriety."  Patient Goals:  "To work on whatever can help me and evaluate what is going on to get sobriety."   Discharge Plan or Barriers: Pt is unsure where he will live when he is discharged, will follow up with outpatient treatment.  Reason for Continuation of Hospitalization: Anxiety Other; describe Substance use  Estimated Length of Stay: 7 days  Recreational Therapy: Patient Stressors: N/A Patient Goal: Patient will identify 3 triggers to anxiety within 5 recreation therapy group sessions  Attendees: Patient:Curtis Cobb 10/10/2018 11:32 AM  Physician: Dr. Jennet Maduro, MD 10/10/2018 11:32 AM  Nursing: Hulan Amato, RN 10/10/2018 11:32 AM  RN Care Manager: 10/10/2018 11:32 AM  Social Worker: Lowella Dandy LCSW 10/10/2018 11:32 AM  Recreational Therapist: Danella Deis. Dreama Saa, LRT 10/10/2018 11:32 AM  Other: Johny Shears LCSWA 10/10/2018 11:32 AM  Other: Damian Leavell, Chaplain 10/10/2018 11:32 AM  Other: 10/10/2018 11:32 AM    Scribe for Treatment Team: Suzan Slick, LCSW 10/10/2018 11:32 AM

## 2018-10-10 NOTE — [facility] Suicide Risk Assessment (Signed)
[facility] Admission Suicide Risk Assessment   Nursing information obtained from:  Patient Demographic factors:  Male, Caucasian, Low socioeconomic status Current Mental Status:  NA Loss Factors:  NA Historical Factors:  NA Risk Reduction Factors:  Positive social support, Positive coping skills or problem solving skills, Living with another person, especially a relative  Total Time spent with patient: 1 hour Principal Problem: Schizoaffective disorder, depressive type (HCC) Diagnosis:   Patient Active Problem List   Diagnosis Date Noted  . Schizoaffective disorder, depressive type (HCC) [F25.1]     Priority: High  . Tobacco use disorder [F17.200] 10/09/2018  . Cellulitis [L03.90] 08/23/2018  . 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 Sedative, hypnotic or anxiolytic use disorder, severe, dependence (HCC) [F13.20] 03/30/2016  . MDD (major depressive disorder), recurrent episode, severe (HCC) [F33.2] 11/17/2015  . Opioid use disorder, mild, in sustained remission (HCC) [F11.11] 10/08/2015  . Alcohol use disorder, severe, dependence (HCC) [F10.20] 10/07/2015  . Cannabis use disorder, moderate, dependence (HCC) [F12.20] 10/07/2015  . HTN (hypertension) [I10] 10/07/2015  . Rheumatoid arthritis (HCC) [M06.9] 10/07/2015   Subjective Data: psychotic break  Continued Clinical Symptoms:  Alcohol Use Disorder Identification Test Final Score (AUDIT): 0 The "Alcohol Use Disorders Identification Test", Guidelines for Use in Primary Care, Second Edition.  [employer] writer [facility]). Score between 0-7:  no or low risk or alcohol related problems. Score between 8-15:  moderate risk of alcohol related problems. Score between 16-19:  high risk of alcohol related problems. Score 20 or above:  warrants further diagnostic evaluation for alcohol dependence and treatment.   CLINICAL FACTORS:   Bipolar Disorder:   Mixed State Alcohol/Substance  Abuse/Dependencies   Musculoskeletal: Strength & Muscle Tone: within normal limits Gait & Station: normal Patient leans: N/A  Psychiatric Specialty Exam: Physical Exam  Nursing note and vitals reviewed. Psychiatric: His affect is blunt. His speech is delayed. He is slowed, withdrawn and actively hallucinating. Thought content is paranoid and delusional. Cognition and memory are impaired. He expresses impulsivity.    Review of Systems  Neurological: Negative.   Psychiatric/Behavioral: Positive for depression and substance abuse.  All other systems reviewed and are negative.   Blood pressure 105/84, pulse 100, temperature 98.6 F (37 C), temperature source Oral, resp. rate 16, height 6\' 2"  (1.88 m), weight 82.6 kg, SpO2 98 %.Body mass index is 23.37 kg/m.  General Appearance: Fairly Groomed  Eye Contact:  Fair  Speech:  Slow  Volume:  Decreased  Mood:  Anxious  Affect:  Flat  Thought Process:  Disorganized and Descriptions of Associations: Tangential  Orientation:  Full (Time, Place, and Person)  Thought Content:  Illogical, Delusions, Hallucinations: Auditory and Paranoid Ideation  Suicidal Thoughts:  No  Homicidal Thoughts:  No  Memory:  Immediate;   Poor Recent;   Poor Remote;   Poor  Judgement:  Poor  Insight:  Shallow  Psychomotor Activity:  Decreased  Concentration:  Concentration: Poor and Attention Span: Poor  Recall:  Poor  Fund of Knowledge:  Poor  Language:  Poor  Akathisia:  No  Handed:  Right  AIMS (if indicated):     Assets:  Communication Skills Desire for Improvement Housing Intimacy Physical Health Resilience Social Support  ADL's:  Intact  Cognition:  WNL  Sleep:  Number of Hours: 5.5      COGNITIVE FEATURES THAT CONTRIBUTE TO RISK:  None    SUICIDE RISK:   Minimal: No identifiable suicidal ideation.  Patients presenting with no risk factors but with  morbid ruminations; may be classified as minimal risk based on the severity of the  depressive symptoms  PLAN OF CARE: hospital admission, medication management, substance abuse counseling, discharge planning.  Mr. Lorusso is a 47 year old male with a history of bipolar disorder and substance abuse admitted for psychotic break in the context of substance use and treatment noncompliance.  #Psychosis -restart Seroquel 300 mg nightly -start Depakote 500 mg BID -Trazodone 100 mg nightly  #Substance abuse -positve for amphetamines and cannabis -minimizes problems as recreational, declines treatment  #Smoking cessation -nicotine patch is available  #Labs -lipid panel, TSH and A1C  -TSH  #Disposition -discharge with his girlfriend -follow up with [facility]  I certify that inpatient services furnished can reasonably be expected to improve the patient's condition.   Kristine Linea, MD 10/10/2018, 4:47 PM
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
## 2018-10-11 MED ORDER — PRAZOSIN HCL 1 MG PO CAPS
1.0000 mg | ORAL_CAPSULE | Freq: Once | ORAL | Status: AC
  Administered 2018-10-11: 1 mg via ORAL
  Filled 2018-10-11: qty 1

## 2018-10-11 MED ORDER — FLUVOXAMINE MALEATE 50 MG PO TABS
50.0000 mg | ORAL_TABLET | Freq: Every day | ORAL | Status: DC
  Administered 2018-10-11: 50 mg via ORAL
  Filled 2018-10-11: qty 1

## 2018-10-11 NOTE — Progress Notes (Signed)
Recreation Therapy Notes  Date: 10/11/2018  Time: 9:30 am   Location: Craft room   Behavioral response: N/A   Intervention Topic: Life planning  Discussion/Intervention: Patient did not attend group.   Clinical Observations/Feedback:  Patient did not attend group.   Danely Bayliss LRT/CTRS        Enrrique Mierzwa 10/11/2018 10:28 AM

## 2018-10-11 NOTE — Progress Notes (Signed)
Received Curtis Cobb this AM after breakfast, he was compliant with his medications. He denied all of the psychiatric symptoms. He has been OOB in the milieu at intervals. He took his first dose of minipress this PM.

## 2018-10-11 NOTE — Progress Notes (Signed)
Recreation Therapy Notes  INPATIENT RECREATION THERAPY ASSESSMENT  Patient Details Name: Curtis Cobb MRN: [id number] DOB: 04/23/1971 Today's Date: 10/11/2018       Information Obtained From: Patient  Able to Participate in Assessment/Interview: Yes  Patient Presentation: Responsive  Reason for Admission (Per Patient): Med Non-Compliance, Substance Abuse, Other (Comments)(Anxiety)  Patient Stressors:    Coping Skills:   Substance Abuse  Leisure Interests (2+):  Nature - [place], Technical brewer - Camping, [employer] - [employer] of Recreation/Participation: Monthly  Awareness of Community Resources:     [employer]:     Current Use:    If no, Barriers?:    Expressed Interest in [employer] Information:    [place] of Residence:  Film/video editor  Patient Main Form of Transportation: Walk  Patient Strengths:  N/A  Patient Identified Areas of Improvement:  Figure out what is going on  Patient Goal for Hospitalization:  To work with someone who can help me to figure out what is going on  Current SI (including self-harm):  No  Current HI:  No  Current AVH: No  Staff Intervention Plan: Group Attendance, Collaborate with Interdisciplinary Treatment Team  Consent to Intern Participation: N/A  Oumou Smead 10/11/2018, 3:02 PM

## 2018-10-11 NOTE — Progress Notes (Signed)
Nursing note 7p-7a  Pt observed isolating in room this shift. Displayed a flat affect and ppleasant mood upon interaction with this Clinical research associate. Pt denies pain ,denies SI/HI, and also denies any audio or visual hallucinations at this time. Pt complains of insomnia, see MAR for prn medication administration.  Pt is able to verbally contract for safety with this RN. Goal: "to go to groups tomorrow." Pt educated on falls and encouraged to wear nonskid socks when ambulating in the halls. Pt also encouraged to call for help if feeling weak or dizzy. Pt verbalized understanding of all education provided. Pt is now resting in bed with eyes closed, with no signs or symptoms of pain or distress noted. Pt continues to remain safe on the unit and is observed by rounding every 15 min. RN will continue to monitor.

## 2018-10-11 NOTE — Progress Notes (Addendum)
[facility] MD Progress Note  10/12/2018 3:09 PM Curtis Cobb  MRN:  [id number]  Subjective:   Curtis Cobb feels extremely anxious but did go to group today with difficulty. He feels the "crushing force of anxiety". He has no been himself for over a years. Even though he feel more anxious that yesterday, his cognition is clearer and he is able to hold a meaningful conversation today without latency or hesitation. We will continue adjusting medications. He is asking for Elavil 100 mg for sleep.   Principal Problem: Schizoaffective disorder, depressive type (HCC) Diagnosis:   Patient Active Problem List   Diagnosis Date Noted  . Schizoaffective disorder, depressive type (HCC) [F25.1]     Priority: High  . Tobacco use disorder [F17.200] 10/09/2018  . Cellulitis [L03.90] 08/23/2018  . 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 Sedative, hypnotic or anxiolytic use disorder, severe, dependence (HCC) [F13.20] 03/30/2016  . MDD (major depressive disorder), recurrent episode, severe (HCC) [F33.2] 11/17/2015  . Opioid use disorder, mild, in sustained remission (HCC) [F11.11] 10/08/2015  . Alcohol use disorder, severe, dependence (HCC) [F10.20] 10/07/2015  . Cannabis use disorder, moderate, dependence (HCC) [F12.20] 10/07/2015  . HTN (hypertension) [I10] 10/07/2015  . Rheumatoid arthritis (HCC) [M06.9] 10/07/2015   Total Time spent with patient: 20 minutes  Past Psychiatric History: depression, anxiety, substance abuse  Past Medical History:  Past Medical History:  Diagnosis Date  . Alcohol use disorder, moderate, dependence (HCC) 02/13/2014  . Anxiety   . Depression   . Hypertension   . Rheumatoid arthritis (HCC)   . Substance abuse [facility])     Past Surgical History:  Procedure Laterality Date  . INCISION AND DRAINAGE ABSCESS Right 08/25/2018   Procedure: INCISION AND DRAINAGE ABSCESS;  Surgeon: Christena Flake, MD;  Location: [facility] ORS;  Service: Orthopedics;  Laterality: Right;   . INCISION AND DRAINAGE ABSCESS Right 08/30/2018   Procedure: INCISION AND DRAINAGE ABSCESS;  Surgeon: Christena Flake, MD;  Location: [facility] ORS;  Service: Orthopedics;  Laterality: Right;  . none     Family History:  Family History  Problem Relation Age of Onset  . Heart attack Mother   . Stroke Mother   . Hypertension Mother   . Mental illness Mother        Manic Depressive Bipolar Disorder  . Heart disease Mother   . Hypertension Maternal Aunt   . Mental illness Maternal Aunt   . Hypertension Maternal Aunt   . Mental illness Maternal Aunt    Family Psychiatric  History: none Social History:  Social History   Substance and Sexual Activity  Alcohol Use Yes   Comment: drank last pm      Social History   Substance and Sexual Activity  Drug Use No  . Types: Amphetamines, Marijuana, Cocaine, Heroin   Comment: one month ago?     Social History   Socioeconomic History  . Marital status: Single    Spouse name: Not on file  . Number of children: Not on file  . Years of education: Not on file  . Highest education level: Not on file  Occupational History  . Not on file  Social Needs  . Financial resource strain: Not on file  . Food insecurity:    Worry: Not on file    Inability: Not on file  . Transportation needs:    Medical: Not on file    Non-medical: Not on file  Tobacco Use  . Smoking status: Current Every Day Smoker  Packs/day: 0.50    Years: 0.00    Pack years: 0.00  . Smokeless tobacco: Never Used  Substance and Sexual Activity  . Alcohol use: Yes    Comment: drank last pm   . Drug use: No    Types: Amphetamines, Marijuana, Cocaine, Heroin    Comment: one month ago?   Marland Kitchen Sexual activity: Yes    Birth control/protection: None  Lifestyle  . Physical activity:    Days per week: Not on file    Minutes per session: Not on file  . Stress: Not on file  Relationships  . Social connections:    Talks on phone: Not on file    Gets together: Not on file     Attends religious service: Not on file    Active member of club or organization: Not on file    Attends meetings of clubs or organizations: Not on file    Relationship status: Not on file  Other Topics Concern  . Not on file  Social History Narrative  . Not on file   Additional Social History:                         Sleep: Fair  Appetite:  Fair  Current Medications: Current Facility-Administered Medications  Medication Dose Route Frequency Provider Last Rate Last Dose  . acetaminophen (TYLENOL) tablet 650 mg  650 mg Oral Q6H PRN Shogo Larkey B, MD      . alum & mag hydroxide-simeth (MAALOX/MYLANTA) 200-200-20 MG/5ML suspension 30 mL  30 mL Oral Q4H PRN Mohamedamin Nifong B, MD      . amitriptyline (ELAVIL) tablet 100 mg  100 mg Oral QHS Isay Perleberg B, MD      . chlorproMAZINE (THORAZINE) tablet 10 mg  10 mg Oral TID PRN Jaima Janney B, MD      . divalproex (DEPAKOTE) DR tablet 500 mg  500 mg Oral Q12H Julia Kulzer B, MD   500 mg at 10/12/18 0853  . fluvoxaMINE (LUVOX) tablet 100 mg  100 mg Oral QHS Brynnlee Cumpian B, MD      . folic acid (FOLVITE) tablet 1 mg  1 mg Oral Daily Taiylor Virden B, MD   1 mg at 10/12/18 0853  . magnesium hydroxide (MILK OF MAGNESIA) suspension 30 mL  30 mL Oral Daily PRN Tyara Dassow B, MD      . nicotine (NICODERM CQ - dosed in mg/24 hours) patch 21 mg  21 mg Transdermal Q0600 Karrissa Parchment B, MD   21 mg at 10/12/18 0611  . prazosin (MINIPRESS) capsule 1 mg  1 mg Oral BID Quasim Doyon B, MD      . QUEtiapine (SEROQUEL) tablet 25 mg  25 mg Oral TID Andreana Klingerman B, MD      . Melene Muller ON 10/13/2018] QUEtiapine (SEROQUEL) tablet 300 mg  300 mg Oral QHS Carmelia Tiner B, MD        Lab Results: No results found for this or any previous visit (from the past 48 hour(s)).  Blood Alcohol level:  Lab Results  Component Value Date   [facility] <10 10/08/2018   ETH <5 07/09/2016     Metabolic Disorder Labs: Lab Results  Component Value Date   HGBA1C 5.2 10/08/2018   MPG 102.54 10/08/2018   Lab Results  Component Value Date   PROLACTIN 13.1 07/10/2016   Lab Results  Component Value Date   CHOL 191 10/08/2018   TRIG 83 10/08/2018  HDL 33 (L) 10/08/2018   CHOLHDL 5.8 10/08/2018   VLDL 17 10/08/2018   LDLCALC 141 (H) 10/08/2018   LDLCALC 98 07/10/2016    Physical Findings: AIMS: Facial and Oral Movements Muscles of Facial Expression: None, normal Lips and Perioral Area: None, normal Jaw: None, normal Tongue: None, normal,Extremity Movements Upper (arms, wrists, hands, fingers): None, normal Lower (legs, knees, ankles, toes): None, normal, Trunk Movements Neck, shoulders, hips: None, normal, Overall Severity Severity of abnormal movements (highest score from questions above): None, normal Incapacitation due to abnormal movements: None, normal Patient's awareness of abnormal movements (rate only patient's report): No Awareness, Dental Status Current problems with teeth and/or dentures?: No Does patient usually wear dentures?: No  CIWA:  CIWA-Ar Total: 0 COWS:  COWS Total Score: 1  Musculoskeletal: Strength & Muscle Tone: within normal limits Gait & Station: normal Patient leans: N/A  Psychiatric Specialty Exam: Physical Exam  Nursing note and vitals reviewed. Psychiatric: He has a normal mood and affect. His speech is normal. He is withdrawn. Thought content is paranoid and delusional. Cognition and memory are impaired. He expresses impulsivity.    Review of Systems  Neurological: Negative.   Psychiatric/Behavioral: The patient is nervous/anxious.   All other systems reviewed and are negative.   Blood pressure 93/80, pulse (!) 104, temperature 97.9 F (36.6 C), temperature source Oral, resp. rate 16, height 6\' 2"  (1.88 m), weight 82.6 kg, SpO2 100 %.Body mass index is 23.37 kg/m.  General Appearance: Casual  Eye Contact:  Good  Speech:   Slow  Volume:  Decreased  Mood:  Anxious  Affect:  Flat  Thought Process:  Goal Directed and Descriptions of Associations: Intact  Orientation:  Full (Time, Place, and Person)  Thought Content:  Delusions  Suicidal Thoughts:  No  Homicidal Thoughts:  No  Memory:  Immediate;   Fair Recent;   Fair Remote;   Fair  Judgement:  Poor  Insight:  Lacking  Psychomotor Activity:  Psychomotor Retardation  Concentration:  Concentration: Fair and Attention Span: Fair  Recall:  of Knowledge:  Fair  Language:  Fair  Akathisia:  No  Handed:  Right  AIMS (if indicated):     Assets:  Communication Skills Desire for Improvement Housing Intimacy Physical Health Social Support  ADL's:  Intact  Cognition:  WNL  Sleep:  Number of Hours: 9     Treatment Plan Summary: Daily contact with patient to assess and evaluate symptoms and progress in treatment and Medication management   Curtis Cobb is a 47 year old male with a history of bipolar disorder and substance abuse admitted for psychotic break in the context of substance use and treatment noncompliance.  #Mood and Psychosis -continue Seroquel 300 mg nightly -Seroquel 25 mg TID -continue Depakote 500 mg BID -discontinued Trazodone 100 mg nightly -start Elavil 100 mg nightly  #Anxiety -increase Luvox to 100 mg nightly OCD -tolerated a single dose of Minipress well -start Minipress 1 mg BID for PTSD -Thorazine 10 mg PRN  #Substance abuse -positve for amphetamines and cannabis -minimizes problems as recreational, declines treatment  #Smoking cessation -nicotine patch is available  #Labs -lipid panel, TSH and A1C  -TSH  #Disposition -discharge with his girlfriend -follow up with [facility]  56, MD 10/12/2018, 3:09 PM

## 2018-10-12 MED ORDER — QUETIAPINE FUMARATE 25 MG PO TABS
25.0000 mg | ORAL_TABLET | Freq: Three times a day (TID) | ORAL | Status: DC
  Administered 2018-10-12 – 2018-10-13 (×4): 25 mg via ORAL
  Filled 2018-10-12 (×4): qty 1

## 2018-10-12 MED ORDER — FLUVOXAMINE MALEATE 50 MG PO TABS
100.0000 mg | ORAL_TABLET | Freq: Every day | ORAL | Status: DC
  Administered 2018-10-12 – 2018-10-16 (×5): 100 mg via ORAL
  Filled 2018-10-12 (×5): qty 2

## 2018-10-12 MED ORDER — AMITRIPTYLINE HCL 50 MG PO TABS
100.0000 mg | ORAL_TABLET | Freq: Every day | ORAL | Status: DC
  Administered 2018-10-12 – 2018-10-17 (×6): 100 mg via ORAL
  Filled 2018-10-12 (×6): qty 2

## 2018-10-12 MED ORDER — CHLORPROMAZINE HCL 10 MG PO TABS
10.0000 mg | ORAL_TABLET | Freq: Three times a day (TID) | ORAL | Status: DC | PRN
  Administered 2018-10-12 – 2018-10-13 (×2): 10 mg via ORAL
  Filled 2018-10-12 (×3): qty 1

## 2018-10-12 MED ORDER — QUETIAPINE FUMARATE 200 MG PO TABS
300.0000 mg | ORAL_TABLET | Freq: Every day | ORAL | Status: DC
  Administered 2018-10-13 – 2018-10-16 (×4): 300 mg via ORAL
  Filled 2018-10-12 (×4): qty 1

## 2018-10-12 MED ORDER — PRAZOSIN HCL 1 MG PO CAPS
1.0000 mg | ORAL_CAPSULE | Freq: Two times a day (BID) | ORAL | Status: DC
  Administered 2018-10-12 – 2018-10-13 (×2): 1 mg via ORAL
  Filled 2018-10-12 (×3): qty 1

## 2018-10-12 NOTE — Progress Notes (Addendum)
[facility] MD Progress Note  10/13/2018 4:48 PM Curtis Cobb  MRN:  [id number]  Subjective:   Feels incapacitated from anxiety. Unable to leave the room. Apparently has MRSA positive healed wound on his hand. On precautions. Reports no side effects fro medications and no improvement.   Principal Problem: Schizoaffective disorder, depressive type (HCC) Diagnosis:   Patient Active Problem List   Diagnosis Date Noted  . Schizoaffective disorder, depressive type (HCC) [F25.1]     Priority: High  . Tobacco use disorder [F17.200] 10/09/2018  . Cellulitis [L03.90] 08/23/2018  . 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 Sedative, hypnotic or anxiolytic use disorder, severe, dependence (HCC) [F13.20] 03/30/2016  . MDD (major depressive disorder), recurrent episode, severe (HCC) [F33.2] 11/17/2015  . Opioid use disorder, mild, in sustained remission (HCC) [F11.11] 10/08/2015  . Alcohol use disorder, severe, dependence (HCC) [F10.20] 10/07/2015  . Cannabis use disorder, moderate, dependence (HCC) [F12.20] 10/07/2015  . HTN (hypertension) [I10] 10/07/2015  . Rheumatoid arthritis (HCC) [M06.9] 10/07/2015   Total Time spent with patient: 20 minutes  Past Psychiatric History: depression, anxiety  Past Medical History:  Past Medical History:  Diagnosis Date  . Alcohol use disorder, moderate, dependence (HCC) 02/13/2014  . Anxiety   . Depression   . Hypertension   . Rheumatoid arthritis (HCC)   . Substance abuse [facility])     Past Surgical History:  Procedure Laterality Date  . INCISION AND DRAINAGE ABSCESS Right 08/25/2018   Procedure: INCISION AND DRAINAGE ABSCESS;  Surgeon: Christena Flake, MD;  Location: [facility] ORS;  Service: Orthopedics;  Laterality: Right;  . INCISION AND DRAINAGE ABSCESS Right 08/30/2018   Procedure: INCISION AND DRAINAGE ABSCESS;  Surgeon: Christena Flake, MD;  Location: [facility] ORS;  Service: Orthopedics;  Laterality: Right;  . none     Family History:  Family  History  Problem Relation Age of Onset  . Heart attack Mother   . Stroke Mother   . Hypertension Mother   . Mental illness Mother        Manic Depressive Bipolar Disorder  . Heart disease Mother   . Hypertension Maternal Aunt   . Mental illness Maternal Aunt   . Hypertension Maternal Aunt   . Mental illness Maternal Aunt    Family Psychiatric  History: depression Social History:  Social History   Substance and Sexual Activity  Alcohol Use Yes   Comment: drank last pm      Social History   Substance and Sexual Activity  Drug Use No  . Types: Amphetamines, Marijuana, Cocaine, Heroin   Comment: one month ago?     Social History   Socioeconomic History  . Marital status: Single    Spouse name: Not on file  . Number of children: Not on file  . Years of education: Not on file  . Highest education level: Not on file  Occupational History  . Not on file  Social Needs  . Financial resource strain: Not on file  . Food insecurity:    Worry: Not on file    Inability: Not on file  . Transportation needs:    Medical: Not on file    Non-medical: Not on file  Tobacco Use  . Smoking status: Current Every Day Smoker    Packs/day: 0.50    Years: 0.00    Pack years: 0.00  . Smokeless tobacco: Never Used  Substance and Sexual Activity  . Alcohol use: Yes    Comment: drank last pm   .  Drug use: No    Types: Amphetamines, Marijuana, Cocaine, Heroin    Comment: one month ago?   Marland Kitchen Sexual activity: Yes    Birth control/protection: None  Lifestyle  . Physical activity:    Days per week: Not on file    Minutes per session: Not on file  . Stress: Not on file  Relationships  . Social connections:    Talks on phone: Not on file    Gets together: Not on file    Attends religious service: Not on file    Active member of club or organization: Not on file    Attends meetings of clubs or organizations: Not on file    Relationship status: Not on file  Other Topics Concern  . Not  on file  Social History Narrative  . Not on file   Additional Social History:                         Sleep: Poor  Appetite:  Fair  Current Medications: Current Facility-Administered Medications  Medication Dose Route Frequency Provider Last Rate Last Dose  . acetaminophen (TYLENOL) tablet 650 mg  650 mg Oral Q6H PRN Elidia Bonenfant B, MD      . alum & mag hydroxide-simeth (MAALOX/MYLANTA) 200-200-20 MG/5ML suspension 30 mL  30 mL Oral Q4H PRN Ephram Kornegay B, MD      . amitriptyline (ELAVIL) tablet 100 mg  100 mg Oral QHS Maryann Mccall B, MD   100 mg at 10/12/18 2144  . chlorproMAZINE (THORAZINE) tablet 25 mg  25 mg Oral TID PRN Dwayne Begay B, MD      . divalproex (DEPAKOTE) DR tablet 500 mg  500 mg Oral Q12H Jovante Hammitt B, MD   500 mg at 10/13/18 0831  . fluvoxaMINE (LUVOX) tablet 100 mg  100 mg Oral QHS Ronni Osterberg B, MD   100 mg at 10/12/18 2144  . folic acid (FOLVITE) tablet 1 mg  1 mg Oral Daily Muriel Wilber B, MD   1 mg at 10/13/18 0830  . magnesium hydroxide (MILK OF MAGNESIA) suspension 30 mL  30 mL Oral Daily PRN Taray Normoyle B, MD      . nicotine (NICODERM CQ - dosed in mg/24 hours) patch 21 mg  21 mg Transdermal Q0600 Kyran Whittier B, MD   21 mg at 10/12/18 0611  . prazosin (MINIPRESS) capsule 2 mg  2 mg Oral BID Selisa Tensley B, MD      . QUEtiapine (SEROQUEL) tablet 25 mg  25 mg Oral TID Briarrose Shor B, MD   25 mg at 10/13/18 1305  . QUEtiapine (SEROQUEL) tablet 300 mg  300 mg Oral QHS Serigne Kubicek B, MD        Lab Results: No results found for this or any previous visit (from the past 48 hour(s)).  Blood Alcohol level:  Lab Results  Component Value Date   ETH <10 10/08/2018   ETH <5 07/09/2016    Metabolic Disorder Labs: Lab Results  Component Value Date   HGBA1C 5.2 10/08/2018   MPG 102.54 10/08/2018   Lab Results  Component Value Date   PROLACTIN 13.1 07/10/2016    Lab Results  Component Value Date   CHOL 191 10/08/2018   TRIG 83 10/08/2018   HDL 33 (L) 10/08/2018   CHOLHDL 5.8 10/08/2018   VLDL 17 10/08/2018   LDLCALC 141 (H) 10/08/2018   LDLCALC 98 07/10/2016    Physical Findings: AIMS: Facial  and Oral Movements Muscles of Facial Expression: None, normal Lips and Perioral Area: None, normal Jaw: None, normal Tongue: None, normal,Extremity Movements Upper (arms, wrists, hands, fingers): None, normal Lower (legs, knees, ankles, toes): None, normal, Trunk Movements Neck, shoulders, hips: None, normal, Overall Severity Severity of abnormal movements (highest score from questions above): None, normal Incapacitation due to abnormal movements: None, normal Patient's awareness of abnormal movements (rate only patient's report): No Awareness, Dental Status Current problems with teeth and/or dentures?: No Does patient usually wear dentures?: No  CIWA:  CIWA-Ar Total: 0 COWS:  COWS Total Score: 1  Musculoskeletal: Strength & Muscle Tone: within normal limits Gait & Station: normal Patient leans: N/A  Psychiatric Specialty Exam: Physical Exam  Nursing note and vitals reviewed. Psychiatric: His speech is normal. His mood appears anxious. He is slowed and withdrawn. Thought content is paranoid. Cognition and memory are normal. He expresses impulsivity.    Review of Systems  Neurological: Negative.   Psychiatric/Behavioral: The patient is nervous/anxious.   All other systems reviewed and are negative.   Blood pressure 116/80, pulse (!) 57, temperature 97.9 F (36.6 C), temperature source Oral, resp. rate 18, height 6\' 2"  (1.88 m), weight 82.6 kg, SpO2 98 %.Body mass index is 23.37 kg/m.  General Appearance: Casual  Eye Contact:  Good  Speech:  Clear and Coherent  Volume:  Decreased  Mood:  Anxious and Depressed  Affect:  Flat  Thought Process:  Goal Directed and Descriptions of Associations: Intact  Orientation:  Full (Time, Place,  and Person)  Thought Content:  WDL  Suicidal Thoughts:  No  Homicidal Thoughts:  No  Memory:  Immediate;   Fair Recent;   Fair Remote;   Fair  Judgement:  Impaired  Insight:  Shallow  Psychomotor Activity:  Psychomotor Retardation  Concentration:  Concentration: Fair and Attention Span: Fair  Recall:  [employer] of Knowledge:  Fair  Language:  Fair  Akathisia:  No  Handed:  Right  AIMS (if indicated):     Assets:  Communication Skills Desire for Improvement Housing Intimacy Physical Health Resilience Social Support  ADL's:  Intact  Cognition:  WNL  Sleep:  Number of Hours: 7.15     Treatment Plan Summary: Daily contact with patient to assess and evaluate symptoms and progress in treatment and Medication management   Curtis Cobb is a 47 year old male with a history of bipolar disorder and substance abuse admitted for psychotic break in the context of substance use and treatment noncompliance.  #Mood andPsychosis -continueSeroquel 300 mg nightly -Seroquel 25 mg TID -continueDepakote 500 mg BID -start Elavil 100 mg nightly  #Anxiety -continue Luvox to 100 mg nightly OCD -increase Minipress 2 mg BID for PTSD -increase Thorazine 25 mg PRN  #Substance abuse -positve for amphetamines and cannabis -minimizes problems as recreational, declines treatment  #Smoking cessation -nicotine patch is available  #Labs -lipid panel, TSH and A1C  -TSH  #Disposition -discharge with his girlfriend -follow up with [facility]  Kristine Linea, MD 10/13/2018, 4:48 PM

## 2018-10-12 NOTE — Plan of Care (Signed)
Compliant with treatment and motivated

## 2018-10-12 NOTE — [facility] Group Notes (Signed)
  Emotional Regulation October 12, 2018  1PM  Type of Therapy/Topic:  Group Therapy:  Emotion Regulation  Participation Level:  Did Not Attend   Description of Group:   The purpose of this group is to assist patients in learning to regulate negative emotions and experience positive emotions. Patients will be guided to discuss ways in which they have been vulnerable to their negative emotions. These vulnerabilities will be juxtaposed with experiences of positive emotions or situations, and patients will be challenged to use positive emotions to combat negative ones. Special emphasis will be placed on coping with negative emotions in conflict situations, and patients will process healthy conflict resolution skills.  Therapeutic Goals: 1. Patient will identify two positive emotions or experiences to reflect on in order to balance out negative emotions 2. Patient will label two or more emotions that they find the most difficult to experience 3. Patient will demonstrate positive conflict resolution skills through discussion and/or role plays  Summary of Patient Progress:       Therapeutic Modalities:   Cognitive Behavioral Therapy Feelings Identification Dialectical Behavioral Therapy  [place] Reeanna Acri LCSW [phone] 

## 2018-10-12 NOTE — Plan of Care (Signed)
Patient is resting quietly in his room and maintaining safety of self and others , contract for safety denies any si/hi or AVH, no complain voiced patient is able to voice and identify positive attribute of self, compliant with his medicines , improving mentally  encourage patient to engage in leisure activities with peers, understood information provided , no distress noted 15 minute safety rounding is maintained . Problem: Education: Goal: Knowledge of [place] [facility] Education information/materials will improve Outcome: Progressing   Problem: Education: Goal: Knowledge of disease or condition will improve Outcome: Progressing Goal: Understanding of discharge needs will improve Outcome: Progressing   Problem: Health Behavior/Discharge Planning: Goal: Ability to identify changes in lifestyle to reduce recurrence of condition will improve Outcome: Progressing Goal: Identification of resources available to assist in meeting health care needs will improve Outcome: Progressing   Problem: Physical Regulation: Goal: Complications related to the disease process, condition or treatment will be avoided or minimized Outcome: Progressing   Problem: Safety: Goal: Ability to remain free from injury will improve Outcome: Progressing

## 2018-10-12 NOTE — [facility] Group Notes (Signed)
[facility] Group Notes:  (Nursing/MHT/Case Management/Adjunct)  Date:  10/12/2018  Time:  2:44 PM  Type of Therapy:  Psychoeducational Skills  Participation Level:  Active  Participation Quality:  Appropriate, Attentive and Sharing  Affect:  Appropriate  Cognitive:  Alert and Appropriate  Insight:  Appropriate  Engagement in Group:  Engaged  Modes of Intervention:  Discussion, Education and Support  Summary of Progress/Problems:  Lynelle Smoke [facility] 10/12/2018, 2:44 PM

## 2018-10-12 NOTE — Progress Notes (Signed)
Patient was in bed awake upon the beginning of this shift. Did not attend evening activities. Alert and oriented. Depressed but denying thoughts of self harm. Denying hallucinations. Continues to complain of depression but pleasant and cooperative. Not willing to engage in long conversations with staff. Was encouraged to call staff as needed. Received his bedtime medications and returned to bed. Staff continue to provide support and encouragements. Safety precautions maintained.

## 2018-10-12 NOTE — Progress Notes (Signed)
Received Savan this AM in his bed, awaken for breakfast. He was compliant with his medications. He endorsed feeling anxious and depressed this AM and rated both 8/10. He denied feeling suicidal this morning. He will talk with the doctor about more medications for anxiety.

## 2018-10-12 NOTE — Progress Notes (Signed)
Recreation Therapy Notes  Date: 10/12/2018  Time: 9:30 am  Location: Craft Room  Behavioral response: Appropriate   Intervention Topic: Goals  Discussion/Intervention:  Group content on today was focused on goals. Patients described what goals are and how they define goals. Individuals expressed how they go about setting goals and reaching them. The group identified how important goals are and if they make short term goals to reach long term goals. Patients described how many goals they work on at a time and what affects them not reaching their goal. Individuals described how much time they put into planning and obtaining their goals. The group participated in the intervention "My Goal Board" and made personal goal boards to help them achieve their goal. Clinical Observations/Feedback:  Patient came to group and was focused on what peers and staff had to say about goals. Individual participated in the intervention during group.  Vanassa Penniman LRT/CTRS         Deavion Dobbs 10/12/2018 11:55 AM

## 2018-10-13 MED ORDER — CHLORDIAZEPOXIDE HCL 25 MG PO CAPS
25.0000 mg | ORAL_CAPSULE | Freq: Four times a day (QID) | ORAL | Status: DC
  Administered 2018-10-13 – 2018-10-16 (×10): 25 mg via ORAL
  Filled 2018-10-13 (×10): qty 1

## 2018-10-13 MED ORDER — CHLORPROMAZINE HCL 25 MG PO TABS
25.0000 mg | ORAL_TABLET | Freq: Three times a day (TID) | ORAL | Status: DC | PRN
  Filled 2018-10-13: qty 1

## 2018-10-13 MED ORDER — PRAZOSIN HCL 2 MG PO CAPS
2.0000 mg | ORAL_CAPSULE | Freq: Two times a day (BID) | ORAL | Status: DC
  Administered 2018-10-13 – 2018-10-15 (×4): 2 mg via ORAL
  Filled 2018-10-13 (×6): qty 1

## 2018-10-13 NOTE — Progress Notes (Signed)
Patient has been in bed, awake, alert and oriented. Pleasant on approach but remains sad and depressed. Not wanting to participate in evening group activities. Denying SI and hallucinations. "I am just....trying to adjust to the medications....". Patient has no major concern so far. Received his due medication and was encouraged to talk to staff as needed. Safety precautions maintained at Q 15 mn level of observation.

## 2018-10-13 NOTE — [facility] Group Notes (Signed)
LCSW Group Therapy Note  10/13/2018 1:00 pm  Type of Therapy/Topic:  Group Therapy:  Balance in Life  Participation Level:  Did Not Attend  Description of Group:    This group will address the concept of balance and how it feels and looks when one is unbalanced. Patients will be encouraged to process areas in their lives that are out of balance and identify reasons for remaining unbalanced. Facilitators will guide patients in utilizing problem-solving interventions to address and correct the stressor making their life unbalanced. Understanding and applying boundaries will be explored and addressed for obtaining and maintaining a balanced life. Patients will be encouraged to explore ways to assertively make their unbalanced needs known to significant others in their lives, using other group members and facilitator for support and feedback.  Therapeutic Goals: 1. Patient will identify two or more emotions or situations they have that consume much of in their lives. 2. Patient will identify signs/triggers that life has become out of balance:  3. Patient will identify two ways to set boundaries in order to achieve balance in their lives:  4. Patient will demonstrate ability to communicate their needs through discussion and/or role plays  Summary of Patient Progress:      Therapeutic Modalities:   Cognitive Behavioral Therapy Solution-Focused Therapy Assertiveness Training  Alease Frame, LCSW 10/13/2018 3:05 PM

## 2018-10-13 NOTE — Plan of Care (Signed)
Cooperative and denying thoughts of self harm. However, still has minimal interactions with staff and peers

## 2018-10-13 NOTE — Progress Notes (Addendum)
[facility] MD Progress Note  10/14/2018 12:54 PM Curtis Cobb  MRN:  [id number]  Subjective:    Curtis Cobb has a history of depression, mood instability and incapacitating anxiety. He has not been on medications using methamphetamines. Since admission, he hardly leaves his room complaining of "crushing anxiety". No medication helps. I decided to cut back on his medicines and give Librium taper as we are not making progress. No intention to give benzos for home.  Curtis Cobb immediately feels better with Librium. Up and about, ate breakfast and lunch, went to group. Denies benzodiazepine abuse. Really, admits to methamphetamines only. Mood better, affect brighter. Jumped out of bed to greet me.   Principal Problem: Schizoaffective disorder, depressive type (HCC) Diagnosis:   Patient Active Problem List   Diagnosis Date Noted  . Schizoaffective disorder, depressive type (HCC) [F25.1]     Priority: High  . Tobacco use disorder [F17.200] 10/09/2018  . Cellulitis [L03.90] 08/23/2018  . 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 Sedative, hypnotic or anxiolytic use disorder, severe, dependence (HCC) [F13.20] 03/30/2016  . MDD (major depressive disorder), recurrent episode, severe (HCC) [F33.2] 11/17/2015  . Opioid use disorder, mild, in sustained remission (HCC) [F11.11] 10/08/2015  . Alcohol use disorder, severe, dependence (HCC) [F10.20] 10/07/2015  . Cannabis use disorder, moderate, dependence (HCC) [F12.20] 10/07/2015  . HTN (hypertension) [I10] 10/07/2015  . Rheumatoid arthritis (HCC) [M06.9] 10/07/2015   Total Time spent with patient: 20 minutes  Past Psychiatric History: depression, anxiety, amphetamine abuse  Past Medical History:  Past Medical History:  Diagnosis Date  . Alcohol use disorder, moderate, dependence (HCC) 02/13/2014  . Anxiety   . Depression   . Hypertension   . Rheumatoid arthritis (HCC)   . Substance abuse [facility])     Past Surgical History:   Procedure Laterality Date  . INCISION AND DRAINAGE ABSCESS Right 08/25/2018   Procedure: INCISION AND DRAINAGE ABSCESS;  Surgeon: Christena Flake, MD;  Location: [facility] ORS;  Service: Orthopedics;  Laterality: Right;  . INCISION AND DRAINAGE ABSCESS Right 08/30/2018   Procedure: INCISION AND DRAINAGE ABSCESS;  Surgeon: Christena Flake, MD;  Location: [facility] ORS;  Service: Orthopedics;  Laterality: Right;  . none     Family History:  Family History  Problem Relation Age of Onset  . Heart attack Mother   . Stroke Mother   . Hypertension Mother   . Mental illness Mother        Manic Depressive Bipolar Disorder  . Heart disease Mother   . Hypertension Maternal Aunt   . Mental illness Maternal Aunt   . Hypertension Maternal Aunt   . Mental illness Maternal Aunt    Family Psychiatric  History: none Social History:  Social History   Substance and Sexual Activity  Alcohol Use Yes   Comment: drank last pm      Social History   Substance and Sexual Activity  Drug Use No  . Types: Amphetamines, Marijuana, Cocaine, Heroin   Comment: one month ago?     Social History   Socioeconomic History  . Marital status: Single    Spouse name: Not on file  . Number of children: Not on file  . Years of education: Not on file  . Highest education level: Not on file  Occupational History  . Not on file  Social Needs  . Financial resource strain: Not on file  . Food insecurity:    Worry: Not on file    Inability: Not on file  .  Transportation needs:    Medical: Not on file    Non-medical: Not on file  Tobacco Use  . Smoking status: Current Every Day Smoker    Packs/day: 0.50    Years: 0.00    Pack years: 0.00  . Smokeless tobacco: Never Used  Substance and Sexual Activity  . Alcohol use: Yes    Comment: drank last pm   . Drug use: No    Types: Amphetamines, Marijuana, Cocaine, Heroin    Comment: one month ago?   Marland Kitchen Sexual activity: Yes    Birth control/protection: None  Lifestyle  .  Physical activity:    Days per week: Not on file    Minutes per session: Not on file  . Stress: Not on file  Relationships  . Social connections:    Talks on phone: Not on file    Gets together: Not on file    Attends religious service: Not on file    Active member of club or organization: Not on file    Attends meetings of clubs or organizations: Not on file    Relationship status: Not on file  Other Topics Concern  . Not on file  Social History Narrative  . Not on file   Additional Social History:                         Sleep: Poor  Appetite:  Poor  Current Medications: Current Facility-Administered Medications  Medication Dose Route Frequency Provider Last Rate Last Dose  . acetaminophen (TYLENOL) tablet 650 mg  650 mg Oral Q6H PRN Opal Dinning B, MD      . alum & mag hydroxide-simeth (MAALOX/MYLANTA) 200-200-20 MG/5ML suspension 30 mL  30 mL Oral Q4H PRN Kem Parcher B, MD      . amitriptyline (ELAVIL) tablet 100 mg  100 mg Oral QHS Deysha Cartier B, MD   100 mg at 10/13/18 2156  . chlordiazePOXIDE (LIBRIUM) capsule 25 mg  25 mg Oral QID Mairely Foxworth B, MD   25 mg at 10/14/18 1135  . divalproex (DEPAKOTE) DR tablet 500 mg  500 mg Oral Q12H Lauraine Crespo B, MD   500 mg at 10/14/18 0800  . fluvoxaMINE (LUVOX) tablet 100 mg  100 mg Oral QHS Giankarlo Leamer B, MD   100 mg at 10/13/18 2157  . folic acid (FOLVITE) tablet 1 mg  1 mg Oral Daily Ryanna Teschner B, MD   1 mg at 10/14/18 0800  . magnesium hydroxide (MILK OF MAGNESIA) suspension 30 mL  30 mL Oral Daily PRN Kaylanni Ezelle B, MD      . nicotine (NICODERM CQ - dosed in mg/24 hours) patch 21 mg  21 mg Transdermal Q0600 Seferino Oscar B, MD   21 mg at 10/12/18 0611  . prazosin (MINIPRESS) capsule 2 mg  2 mg Oral BID Clovis Mankins B, MD   2 mg at 10/14/18 0800  . QUEtiapine (SEROQUEL) tablet 300 mg  300 mg Oral QHS Rigoberto Repass B, MD   300 mg at 10/13/18  2156    Lab Results: No results found for this or any previous visit (from the past 48 hour(s)).  Blood Alcohol level:  Lab Results  Component Value Date   [facility] <10 10/08/2018   ETH <5 07/09/2016    Metabolic Disorder Labs: Lab Results  Component Value Date   HGBA1C 5.2 10/08/2018   MPG 102.54 10/08/2018   Lab Results  Component Value Date   PROLACTIN  13.1 07/10/2016   Lab Results  Component Value Date   CHOL 191 10/08/2018   TRIG 83 10/08/2018   HDL 33 (L) 10/08/2018   CHOLHDL 5.8 10/08/2018   VLDL 17 10/08/2018   LDLCALC 141 (H) 10/08/2018   LDLCALC 98 07/10/2016    Physical Findings: AIMS: Facial and Oral Movements Muscles of Facial Expression: None, normal Lips and Perioral Area: None, normal Jaw: None, normal Tongue: None, normal,Extremity Movements Upper (arms, wrists, hands, fingers): None, normal Lower (legs, knees, ankles, toes): None, normal, Trunk Movements Neck, shoulders, hips: None, normal, Overall Severity Severity of abnormal movements (highest score from questions above): None, normal Incapacitation due to abnormal movements: None, normal Patient's awareness of abnormal movements (rate only patient's report): No Awareness, Dental Status Current problems with teeth and/or dentures?: No Does patient usually wear dentures?: No  CIWA:  CIWA-Ar Total: 0 COWS:  COWS Total Score: 1  Musculoskeletal: Strength & Muscle Tone: within normal limits Gait & Station: normal Patient leans: N/A  Psychiatric Specialty Exam: Physical Exam  Nursing note and vitals reviewed. Psychiatric: Thought content normal. His mood appears anxious. His affect is blunt. His speech is delayed and tangential. He is slowed and withdrawn. Cognition and memory are impaired. He expresses impulsivity. He exhibits a depressed mood.    Review of Systems  Neurological: Negative.   Psychiatric/Behavioral: Positive for depression and substance abuse. The patient is nervous/anxious.    All other systems reviewed and are negative.   Blood pressure 110/80, pulse 90, temperature 97.7 F (36.5 C), temperature source Oral, resp. rate 16, height 6\' 2"  (1.88 m), weight 82.6 kg, SpO2 100 %.Body mass index is 23.37 kg/m.  General Appearance: Casual  Eye Contact:  Fair  Speech:  Slow  Volume:  Decreased  Mood:  Anxious and Depressed  Affect:  Flat  Thought Process:  Irrelevant  Orientation:  Full (Time, Place, and Person)  Thought Content:  WDL  Suicidal Thoughts:  No  Homicidal Thoughts:  No  Memory:  Immediate;   Fair Recent;   Fair Remote;   Fair  Judgement:  Poor  Insight:  Lacking  Psychomotor Activity:  Psychomotor Retardation  Concentration:  Concentration: Fair and Attention Span: Fair  Recall:  of Knowledge:  Fair  Language:  Fair  Akathisia:  No  Handed:  Right  AIMS (if indicated):     Assets:  Communication Skills Desire for Improvement Housing Intimacy Physical Health Resilience Social Support  ADL's:  Intact  Cognition:  WNL  Sleep:  Number of Hours: 7     Treatment Plan Summary: Daily contact with patient to assess and evaluate symptoms and progress in treatment and Medication management   Curtis Cobb is a 47 year old male with a history of bipolar disorder and substance abuse admitted for psychotic break in the context of substance use and treatment noncompliance.  #Mood andPsychosis -continueSeroquel 300 mg nightly -continueDepakote 500 mg BID, level in am -continue Elavil 100 mg nightly  #Detox -Librium taper for 3 days  #Anxiety -continue Luvox to 100 mg nightlyOCD -increase Minipress2 mg BIDfor PTSD  #Substance abuse -positve for amphetamines and cannabis -minimizes problem as "recreational" declines treatment  #Elevated TSH with low T4 -start Synthroid 25 ugm   #Smoking cessation -nicotine patch is available  #Labs -lipid panel, and A1C normal, TSH slightly elevated, will recheck   -EKG  #Disposition -discharge with his girlfriend -follow up with [facility]  56, MD 10/14/2018, 12:54 PM

## 2018-10-13 NOTE — Progress Notes (Signed)
Received Curtis Cobb this AM, awaken for breakfast, he was compliant with his medications and afterwards returned to bed. He continues to endorse feeling anxious and depressed. He continued to be isolated in his room throughout the day except for meals. Midday, he endorsed having a panic attack. He is feeling better this evening, but remains in his room.

## 2018-10-13 NOTE — Progress Notes (Signed)
Recreation Therapy Notes   Date: 10/13/2018  Time: 9:30 am   Location: Craft room   Behavioral response: N/A   Intervention Topic: Problem Solving  Discussion/Intervention: Patient did not attend group.   Clinical Observations/Feedback:  Patient did not attend group.   Rhondalyn Clingan LRT/CTRS         Maxx Pham 10/13/2018 10:27 AM

## 2018-10-13 NOTE — [facility] Suicide Risk Assessment (Signed)
[facility] INPATIENT:  Family/Significant Other Suicide Prevention Education  Suicide Prevention Education:  Patient Refusal for Family/Significant Other Suicide Prevention Education: The patient Curtis Cobb has refused to provide written consent for family/significant other to be provided Family/Significant Other Suicide Prevention Education during admission and/or prior to discharge.  Physician notified.  Johny Shears 10/13/2018, 4:03 PM

## 2018-10-14 LAB — T4, FREE: Free T4: 0.57 ng/dL — ABNORMAL LOW (ref 0.82–1.77)

## 2018-10-14 MED ORDER — LEVOTHYROXINE SODIUM 25 MCG PO TABS
25.0000 ug | ORAL_TABLET | Freq: Every day | ORAL | Status: DC
  Administered 2018-10-15 – 2018-10-18 (×4): 25 ug via ORAL
  Filled 2018-10-14 (×4): qty 1

## 2018-10-14 NOTE — Progress Notes (Signed)
Patient ha been in bed, reporting that he needs to rest. Reports that he is feeling improvement in mood, that he was able to attend some unit activities. Reports that current medications are helping. Rates his depression at 4/10. Denying suicidal thoughts. Denying hallucinations. Received his Depakote and was encouraged to participate in evening activities as tolerated. Safety precautions maintained.

## 2018-10-14 NOTE — Plan of Care (Signed)
Reports feeling improved in mood. Calm and cooperative and compliant with medication

## 2018-10-14 NOTE — [facility] Group Notes (Signed)
Feelings Around Relapse 10/14/2018 1PM  Type of Therapy and Topic:  Group Therapy:  Feelings around Relapse and Recovery  Participation Level:  Active   Description of Group:    Patients in this group will discuss emotions they experience before and after a relapse. They will process how experiencing these feelings, or avoidance of experiencing them, relates to having a relapse. Facilitator will guide patients to explore emotions they have related to recovery. Patients will be encouraged to process which emotions are more powerful. They will be guided to discuss the emotional reaction significant others in their lives may have to patients' relapse or recovery. Patients will be assisted in exploring ways to respond to the emotions of others without this contributing to a relapse.  Therapeutic Goals: 1. Patient will identify two or more emotions that lead to a relapse for them 2. Patient will identify two emotions that result when they relapse 3. Patient will identify two emotions related to recovery 4. Patient will demonstrate ability to communicate their needs through discussion and/or role plays   Summary of Patient Progress: Actively and appropriately engaged in the group. Patient was able to provide support and validation to other group members.Patient practiced active listening when interacting with the facilitator and other group members. Patient spoke about being influenced by other people around him. He had insight into how people, places and things can affect him relapsing or while he is in recovery. Patient is still in the process of obtaining treatment goals.       Therapeutic Modalities:   Cognitive Behavioral Therapy Solution-Focused Therapy Assertiveness Training Relapse Prevention Therapy   Suzan Slick, LCSW 10/14/2018 2:51 PM

## 2018-10-14 NOTE — Progress Notes (Signed)
Recreation Therapy Notes  Date: 10/14/2018  Time: 9:30 am   Location: Craft room   Behavioral response: N/A   Intervention Topic: Leisure   Discussion/Intervention: Patient did not attend group.   Clinical Observations/Feedback:  Patient did not attend group.   Kenny Stern LRT/CTRS        Arlan Birks 10/14/2018 10:52 AM

## 2018-10-14 NOTE — [facility] Group Notes (Signed)
[facility] Group Notes:  (Nursing/MHT/Case Management/Adjunct)  Date:  10/14/2018  Time:  9:23 PM  Type of Therapy:  Group Therapy  Participation Level:  Active  Participation Quality:  Appropriate  Affect:  Appropriate  Cognitive:  Alert  Insight:  Good  Engagement in Group:  Engaged  Modes of Intervention:  Support  Summary of Progress/Problems:  Curtis Cobb 10/14/2018, 9:23 PM

## 2018-10-14 NOTE — Tx Team (Signed)
Interdisciplinary Treatment and Diagnostic Plan Update  10/14/2018 Time of Session: 8:30am Curtis Cobb MRN: [id number]  Principal Diagnosis: Schizoaffective disorder, depressive type (HCC)  Secondary Diagnoses: Principal Problem:   Schizoaffective disorder, depressive type (HCC) Active Problems:   Cannabis use disorder, moderate, dependence (HCC)   Tobacco use disorder   Current Medications:  Current Facility-Administered Medications  Medication Dose Route Frequency Provider Last Rate Last Dose  . acetaminophen (TYLENOL) tablet 650 mg  650 mg Oral Q6H PRN Pucilowska, Jolanta B, MD      . alum & mag hydroxide-simeth (MAALOX/MYLANTA) 200-200-20 MG/5ML suspension 30 mL  30 mL Oral Q4H PRN Pucilowska, Jolanta B, MD      . amitriptyline (ELAVIL) tablet 100 mg  100 mg Oral QHS Pucilowska, Jolanta B, MD   100 mg at 10/13/18 2156  . chlordiazePOXIDE (LIBRIUM) capsule 25 mg  25 mg Oral QID Pucilowska, Jolanta B, MD   25 mg at 10/14/18 1135  . divalproex (DEPAKOTE) DR tablet 500 mg  500 mg Oral Q12H Pucilowska, Jolanta B, MD   500 mg at 10/14/18 0800  . fluvoxaMINE (LUVOX) tablet 100 mg  100 mg Oral QHS Pucilowska, Jolanta B, MD   100 mg at 10/13/18 2157  . folic acid (FOLVITE) tablet 1 mg  1 mg Oral Daily Pucilowska, Jolanta B, MD   1 mg at 10/14/18 0800  . magnesium hydroxide (MILK OF MAGNESIA) suspension 30 mL  30 mL Oral Daily PRN Pucilowska, Jolanta B, MD      . nicotine (NICODERM CQ - dosed in mg/24 hours) patch 21 mg  21 mg Transdermal Q0600 Pucilowska, Jolanta B, MD   21 mg at 10/12/18 0611  . prazosin (MINIPRESS) capsule 2 mg  2 mg Oral BID Pucilowska, Jolanta B, MD   2 mg at 10/14/18 0800  . QUEtiapine (SEROQUEL) tablet 300 mg  300 mg Oral QHS Pucilowska, Jolanta B, MD   300 mg at 10/13/18 2156   PTA Medications: Medications Prior to Admission  Medication Sig Dispense Refill Last Dose  . diclofenac (VOLTAREN) 75 MG EC tablet Take 1 tablet (75 mg total) by mouth 2 (two) times  daily as needed. (Patient not taking: Reported on 08/23/2018) 60 tablet 0 Not Taking at Unknown time  . ibuprofen (ADVIL,MOTRIN) 400 MG tablet Take 1 tablet (400 mg total) by mouth every 8 (eight) hours as needed. (Patient not taking: Reported on 08/23/2018) 15 tablet 0 Not Taking at Unknown time  . LORazepam (ATIVAN) 2 MG tablet Take 2 mg by mouth at bedtime as needed. for sleep  1 prn at prn  . oxyCODONE (OXY IR/ROXICODONE) 5 MG immediate release tablet Take 1-2 tablets (5-10 mg total) by mouth every 6 (six) hours as needed for moderate pain (pain score 4-6). 20 tablet 0   . QUEtiapine (SEROQUEL) 300 MG tablet Take 1 tablet (300 mg total) by mouth at bedtime. 30 tablet 0 08/22/2018 at Unknown time    Patient Stressors: Medication change or noncompliance Substance abuse Other: anxiety  Patient Strengths: Ability for insight Average or above average intelligence Capable of independent living General fund of knowledge  Treatment Modalities: Medication Management, Group therapy, Case management,  1 to 1 session with clinician, Psychoeducation, Recreational therapy.   Physician Treatment Plan for Primary Diagnosis: Schizoaffective disorder, depressive type (HCC) Long Term Goal(s): Improvement in symptoms so as ready for discharge Improvement in symptoms so as ready for discharge   Short Term Goals: Ability to identify changes in lifestyle to reduce recurrence of condition  will improve Ability to verbalize feelings will improve Ability to disclose and discuss suicidal ideas Ability to demonstrate self-control will improve Ability to identify and develop effective coping behaviors will improve Ability to maintain clinical measurements within normal limits will improve Compliance with prescribed medications will improve Ability to identify triggers associated with substance abuse/mental health issues will improve Ability to identify changes in lifestyle to reduce recurrence of condition will  improve Ability to demonstrate self-control will improve Ability to identify triggers associated with substance abuse/mental health issues will improve  Medication Management: Evaluate patient's response, side effects, and tolerance of medication regimen.  Therapeutic Interventions: 1 to 1 sessions, Unit Group sessions and Medication administration.  Evaluation of Outcomes: Progressing  Physician Treatment Plan for Secondary Diagnosis: Principal Problem:   Schizoaffective disorder, depressive type (HCC) Active Problems:   Cannabis use disorder, moderate, dependence (HCC)   Tobacco use disorder  Long Term Goal(s): Improvement in symptoms so as ready for discharge Improvement in symptoms so as ready for discharge   Short Term Goals: Ability to identify changes in lifestyle to reduce recurrence of condition will improve Ability to verbalize feelings will improve Ability to disclose and discuss suicidal ideas Ability to demonstrate self-control will improve Ability to identify and develop effective coping behaviors will improve Ability to maintain clinical measurements within normal limits will improve Compliance with prescribed medications will improve Ability to identify triggers associated with substance abuse/mental health issues will improve Ability to identify changes in lifestyle to reduce recurrence of condition will improve Ability to demonstrate self-control will improve Ability to identify triggers associated with substance abuse/mental health issues will improve     Medication Management: Evaluate patient's response, side effects, and tolerance of medication regimen.  Therapeutic Interventions: 1 to 1 sessions, Unit Group sessions and Medication administration.  Evaluation of Outcomes: Progressing   RN Treatment Plan for Primary Diagnosis: Schizoaffective disorder, depressive type (HCC) Long Term Goal(s): Knowledge of disease and therapeutic regimen to maintain health  will improve  Short Term Goals: Ability to verbalize feelings will improve, Ability to identify and develop effective coping behaviors will improve and Compliance with prescribed medications will improve  Medication Management: RN will administer medications as ordered by provider, will assess and evaluate patient's response and provide education to patient for prescribed medication. RN will report any adverse and/or side effects to prescribing provider.  Therapeutic Interventions: 1 on 1 counseling sessions, Psychoeducation, Medication administration, Evaluate responses to treatment, Monitor vital signs and CBGs as ordered, Perform/monitor CIWA, COWS, AIMS and Fall Risk screenings as ordered, Perform wound care treatments as ordered.  Evaluation of Outcomes: Progressing   LCSW Treatment Plan for Primary Diagnosis: Schizoaffective disorder, depressive type (HCC) Long Term Goal(s): Safe transition to appropriate next level of care at discharge, Engage patient in therapeutic group addressing interpersonal concerns.  Short Term Goals: Engage patient in aftercare planning with referrals and resources, Increase social support, Facilitate patient progression through stages of change regarding substance use diagnoses and concerns, Identify triggers associated with mental health/substance abuse issues and Increase skills for wellness and recovery  Therapeutic Interventions: Assess for all discharge needs, 1 to 1 time with Social worker, Explore available resources and support systems, Assess for adequacy in community support network, Educate family and significant other(s) on suicide prevention, Complete Psychosocial Assessment, Interpersonal group therapy.  Evaluation of Outcomes: Progressing   Progress in Treatment: Attending groups: No. Participating in groups: No. Taking medication as prescribed: Yes. Toleration medication: Yes. Family/Significant other contact made: No, will contact:  Pt has  not identified contact Patient understands diagnosis: Yes. Discussing patient identified problems/goals with staff: Yes. Medical problems stabilized or resolved: Yes. Denies suicidal/homicidal ideation: Yes. Issues/concerns per patient self-inventory: Yes. Other: substance use concerns reported.  New problem(s) identified: No, Describe:  NA  New Short Term/Long Term Goal(s):"To work on whatever can help me and evaluate what is going on to get sobriety."  Patient Goals:  "To work on whatever can help me and evaluate what is going on to get sobriety."   Discharge Plan or Barriers: Pt will be discharged and live with gf and follow up with [facility] for peer support service.  Reason for Continuation of Hospitalization: Anxiety Other; describe Substance use  Estimated Length of Stay: 5-7 days  Recreational Therapy: Patient Stressors: N/A Patient Goal: Patient will identify 3 triggers to anxiety within 5 recreation therapy group sessions  Attendees: Patient: Pt did not attend 10/14/2018 11:39 AM  Physician: Dr. Jennet Maduro, MD 10/14/2018 11:39 AM  Nursing: Cecille Amsterdam RN 10/14/2018 11:39 AM  RN Care Manager: 10/14/2018 11:39 AM  Social Worker: Lowella Dandy LCSW 10/14/2018 11:39 AM  Recreational Therapist: 10/14/2018 11:39 AM  Other: Nathanial Rancher, LCSW 10/14/2018 11:39 AM  Other: Unk Pinto, [facility] 10/14/2018 11:39 AM  Other: Dr. Johnella Moloney, MD 10/14/2018 11:39 AM    Scribe for Treatment Team: Suzan Slick, LCSW 10/14/2018 11:39 AM

## 2018-10-14 NOTE — Plan of Care (Signed)
Patients affect is brighter today.Patient was receptive and willing to attend groups this morning.Patient came to staff after groups states "I have social anxiety,I feel like I am crushing inside."Scheduled Librium given,patient stated that it helped.Patient denies SI,HI and AVH.Compliant with medications.Appetite and energy level good.Support and encouragement given.

## 2018-10-15 LAB — AMMONIA: AMMONIA: 15 umol/L (ref 9–35)

## 2018-10-15 LAB — TSH: TSH: 9.437 u[IU]/mL — ABNORMAL HIGH (ref 0.350–4.500)

## 2018-10-15 LAB — VALPROIC ACID LEVEL: VALPROIC ACID LVL: 60 ug/mL (ref 50.0–100.0)

## 2018-10-15 NOTE — Progress Notes (Signed)
[facility] MD Progress Note  10/15/2018 12:49 PM Curtis Cobb  MRN:  [id number]   Subjective:    Curtis Cobb has a history of depression, mood instability and incapacitating anxiety.   Pt somewhat isolative in his room and anxious, but reports feeling better than before, reports anxiety on and off,  denies SI, denies AVH, taking meds , denies side effects, slept well.  Principal Problem: Schizoaffective disorder, depressive type (HCC) Diagnosis:   Patient Active Problem List   Diagnosis Date Noted  . Tobacco use disorder [F17.200] 10/09/2018  . Cellulitis [L03.90] 08/23/2018  . 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 Sedative, hypnotic or anxiolytic use disorder, severe, dependence (HCC) [F13.20] 03/30/2016  . MDD (major depressive disorder), recurrent episode, severe (HCC) [F33.2] 11/17/2015  . Opioid use disorder, mild, in sustained remission (HCC) [F11.11] 10/08/2015  . Alcohol use disorder, severe, dependence (HCC) [F10.20] 10/07/2015  . Cannabis use disorder, moderate, dependence (HCC) [F12.20] 10/07/2015  . HTN (hypertension) [I10] 10/07/2015  . Rheumatoid arthritis (HCC) [M06.9] 10/07/2015  . Schizoaffective disorder, depressive type (HCC) [F25.1]    Total Time spent with patient: 25 min  Past Psychiatric History: depression, anxiety, amphetamine abuse  Past Medical History:  Past Medical History:  Diagnosis Date  . Alcohol use disorder, moderate, dependence (HCC) 02/13/2014  . Anxiety   . Depression   . Hypertension   . Rheumatoid arthritis (HCC)   . Substance abuse [facility])     Past Surgical History:  Procedure Laterality Date  . INCISION AND DRAINAGE ABSCESS Right 08/25/2018   Procedure: INCISION AND DRAINAGE ABSCESS;  Surgeon: Christena Flake, MD;  Location: [facility] ORS;  Service: Orthopedics;  Laterality: Right;  . INCISION AND DRAINAGE ABSCESS Right 08/30/2018   Procedure: INCISION AND DRAINAGE ABSCESS;  Surgeon: Christena Flake, MD;  Location: [facility] ORS;   Service: Orthopedics;  Laterality: Right;  . none     Family History:  Family History  Problem Relation Age of Onset  . Heart attack Mother   . Stroke Mother   . Hypertension Mother   . Mental illness Mother        Manic Depressive Bipolar Disorder  . Heart disease Mother   . Hypertension Maternal Aunt   . Mental illness Maternal Aunt   . Hypertension Maternal Aunt   . Mental illness Maternal Aunt    Family Psychiatric  History: none Social History:  Social History   Substance and Sexual Activity  Alcohol Use Yes   Comment: drank last pm      Social History   Substance and Sexual Activity  Drug Use No  . Types: Amphetamines, Marijuana, Cocaine, Heroin   Comment: one month ago?     Social History   Socioeconomic History  . Marital status: Single    Spouse name: Not on file  . Number of children: Not on file  . Years of education: Not on file  . Highest education level: Not on file  Occupational History  . Not on file  Social Needs  . Financial resource strain: Not on file  . Food insecurity:    Worry: Not on file    Inability: Not on file  . Transportation needs:    Medical: Not on file    Non-medical: Not on file  Tobacco Use  . Smoking status: Current Every Day Smoker    Packs/day: 0.50    Years: 0.00    Pack years: 0.00  . Smokeless tobacco: Never Used  Substance and Sexual Activity  .  Alcohol use: Yes    Comment: drank last pm   . Drug use: No    Types: Amphetamines, Marijuana, Cocaine, Heroin    Comment: one month ago?   Marland Kitchen Sexual activity: Yes    Birth control/protection: None  Lifestyle  . Physical activity:    Days per week: Not on file    Minutes per session: Not on file  . Stress: Not on file  Relationships  . Social connections:    Talks on phone: Not on file    Gets together: Not on file    Attends religious service: Not on file    Active member of club or organization: Not on file    Attends meetings of clubs or organizations: Not  on file    Relationship status: Not on file  Other Topics Concern  . Not on file  Social History Narrative  . Not on file   Additional Social History:                         Sleep: Poor  Appetite:  Poor  Current Medications: Current Facility-Administered Medications  Medication Dose Route Frequency Provider Last Rate Last Dose  . acetaminophen (TYLENOL) tablet 650 mg  650 mg Oral Q6H PRN Pucilowska, Jolanta B, MD      . alum & mag hydroxide-simeth (MAALOX/MYLANTA) 200-200-20 MG/5ML suspension 30 mL  30 mL Oral Q4H PRN Pucilowska, Jolanta B, MD      . amitriptyline (ELAVIL) tablet 100 mg  100 mg Oral QHS Pucilowska, Jolanta B, MD   100 mg at 10/14/18 2133  . chlordiazePOXIDE (LIBRIUM) capsule 25 mg  25 mg Oral QID Pucilowska, Jolanta B, MD   25 mg at 10/15/18 1222  . divalproex (DEPAKOTE) DR tablet 500 mg  500 mg Oral Q12H Pucilowska, Jolanta B, MD   500 mg at 10/15/18 0752  . fluvoxaMINE (LUVOX) tablet 100 mg  100 mg Oral QHS Pucilowska, Jolanta B, MD   100 mg at 10/14/18 2133  . folic acid (FOLVITE) tablet 1 mg  1 mg Oral Daily Pucilowska, Jolanta B, MD   1 mg at 10/15/18 0752  . levothyroxine (SYNTHROID, LEVOTHROID) tablet 25 mcg  25 mcg Oral QAC breakfast Pucilowska, Jolanta B, MD   25 mcg at 10/15/18 0751  . magnesium hydroxide (MILK OF MAGNESIA) suspension 30 mL  30 mL Oral Daily PRN Pucilowska, Jolanta B, MD      . nicotine (NICODERM CQ - dosed in mg/24 hours) patch 21 mg  21 mg Transdermal Q0600 Pucilowska, Jolanta B, MD   21 mg at 10/12/18 0611  . prazosin (MINIPRESS) capsule 2 mg  2 mg Oral BID Pucilowska, Jolanta B, MD   2 mg at 10/14/18 1807  . QUEtiapine (SEROQUEL) tablet 300 mg  300 mg Oral QHS Pucilowska, Jolanta B, MD   300 mg at 10/14/18 2133    Lab Results:  Results for orders placed or performed during the hospital encounter of 10/09/18 (from the past 48 hour(s))  T4, free     Status: Abnormal   Collection Time: 10/14/18  3:03 PM  Result Value Ref Range    Free T4 0.57 (L) 0.82 - 1.77 ng/dL    Comment: (NOTE) Biotin ingestion may interfere with free T4 tests. If the results are inconsistent with the TSH level, previous test results, or the clinical presentation, then consider biotin interference. If needed, order repeat testing after stopping biotin. Performed at [facility], [address] [place], [place] [address]   Valproic acid level     Status: None   Collection Time: 10/15/18  6:47 AM  Result Value Ref Range   Valproic Acid Lvl 60 50.0 - 100.0 ug/mL    Comment: Performed at [facility], [address] [place], [place] [address]  Ammonia     Status: None   Collection Time: 10/15/18  6:47 AM  Result Value Ref Range   Ammonia 15 9 - 35 umol/L    Comment: Performed at [facility], [address] [place], [place] [address]  TSH     Status: Abnormal   Collection Time: 10/15/18  6:47 AM  Result Value Ref Range   TSH 9.437 (H) 0.350 - 4.500 uIU/mL    Comment: Performed by a 3rd Generation assay with a functional sensitivity of <=0.01 uIU/mL. Performed at [facility], [address] [place], [place] [address]     Blood Alcohol level:  Lab Results  Component Value Date   [facility] <10 10/08/2018   ETH <5 07/09/2016    Metabolic Disorder Labs: Lab Results  Component Value Date   HGBA1C 5.2 10/08/2018   MPG 102.54 10/08/2018   Lab Results  Component Value Date   PROLACTIN 13.1 07/10/2016   Lab Results  Component Value Date   CHOL 191 10/08/2018   TRIG 83 10/08/2018   HDL 33 (L) 10/08/2018   CHOLHDL 5.8 10/08/2018   VLDL 17 10/08/2018   LDLCALC 141 (H) 10/08/2018   LDLCALC 98 07/10/2016    Physical Findings: AIMS: Facial and Oral Movements Muscles of Facial Expression: None, normal Lips and Perioral Area: None, normal Jaw: None, normal Tongue: None, normal,Extremity Movements Upper (arms, wrists, hands, fingers): None, normal Lower (legs, knees, ankles, toes): None,  normal, Trunk Movements Neck, shoulders, hips: None, normal, Overall Severity Severity of abnormal movements (highest score from questions above): None, normal Incapacitation due to abnormal movements: None, normal Patient's awareness of abnormal movements (rate only patient's report): No Awareness, Dental Status Current problems with teeth and/or dentures?: No Does patient usually wear dentures?: No  CIWA:  CIWA-Ar Total: 0 COWS:  COWS Total Score: 1  Musculoskeletal: Strength & Muscle Tone: within normal limits Gait & Station: normal Patient leans: N/A  Psychiatric Specialty Exam: Physical Exam  Nursing note and vitals reviewed. Psychiatric: Thought content normal. His mood appears anxious. His affect is blunt. His speech is delayed and tangential. He is slowed and withdrawn. Cognition and memory are impaired. He expresses impulsivity. He exhibits a depressed mood.    Review of Systems  Neurological: Negative.   Psychiatric/Behavioral: Positive for depression and substance abuse. The patient is nervous/anxious.   All other systems reviewed and are negative.   Blood pressure 94/61, pulse (!) 104, temperature 97.9 F (36.6 C), temperature source Oral, resp. rate 20, height 6\' 2"  (1.88 m), weight 82.6 kg, SpO2 100 %.Body mass index is 23.37 kg/m.  General Appearance: Casual  Eye Contact:  Fair  Speech:  Slow  Volume:  Decreased  Mood:  better  Affect:  restricted  Thought Process:  Irrelevant  Orientation:  Full (Time, Place, and Person)  Thought Content:  WDL  Suicidal Thoughts:  No  Homicidal Thoughts:  No  Memory:  Immediate;   Fair Recent;   Fair Remote;   Fair  Judgement:  Poor  Insight:  Lacking  Psychomotor Activity:  Psychomotor Retardation  Concentration:  Concentration: Fair and Attention Span: Fair  Recall:  of Knowledge:  Fair  Language:  Fair  Akathisia:  No  Handed:  Right  AIMS (if indicated):     Assets:  Communication Skills Desire for  Improvement Housing Intimacy Physical Health Resilience Social Support  ADL's:  Intact  Cognition:  WNL  Sleep:  Number of Hours: 6.45     Treatment Plan Summary: Daily contact with patient to assess and evaluate symptoms and progress in treatment and Medication management   Curtis Cobb is a 47 year old male with a history of bipolar disorder and substance abuse admitted for psychotic break in the context of substance use and treatment noncompliance. Psychosis improving.   #Mood andPsychosis -continueSeroquel 300 mg nightly -continueDepakote 500 mg BID, level- 60 -continue Elavil 100 mg nightly  #Detox -Librium taper   #Anxiety -continue Luvox to 100 mg nightlyOCD - Minipress2 mg BIDfor PTSD  #Substance abuse -positve for amphetamines and cannabis -minimizes problem   #Elevated TSH with low T4 - Synthroid 25 ugm   #Smoking cessation -nicotine patch is available  #Labs -lipid panel, and A1C normal, TSH slightly elevated, will recheck  -EKG  #Disposition -discharge with his girlfriend -follow up with [facility]  Beverly Sessions, MD 10/15/2018, 12:49 PMPatient ID: Curtis Cobb, male   DOB: 07/09/1971, 47 y.o.   MRN: [id number]

## 2018-10-15 NOTE — Plan of Care (Signed)
Mood improved. Patient is more visible in the milieu, attending groups. Compliant with medications.

## 2018-10-15 NOTE — [facility] Group Notes (Signed)
LCSW Group Therapy Note  10/15/2018 1:15pm  Type of Therapy and Topic:  Group Therapy:  Cognitive Distortions  Participation Level:  Active   Description of Group:    Patients in this group will be introduced to the topic of cognitive distortions.  Patients will identify and describe cognitive distortions, describe the feelings these distortions create for them.  Patients will identify one or more situations in their personal life where they have cognitively distorted thinking and will verbalize challenging this cognitive distortion through positive thinking skills.  Patients will practice the skill of using positive affirmations to challenge cognitive distortions using affirmation cards.    Therapeutic Goals:  1. Patient will identify two or more cognitive distortions they have used 2. Patient will identify one or more emotions that stem from use of a cognitive distortion 3. Patient will demonstrate use of a positive affirmation to counter a cognitive distortion through discussion and/or role play. 4. Patient will describe one way cognitive distortions can be detrimental to wellness   Summary of Patient Progress: The patient reported that he feels "hopeful." Patients were introduced to the topic of cognitive distortions. The patient was able to identify and describe cognitive distortions, described the feelings these distortions create for him.  The patient shared his most used unhelpful thinking styles are "should and overgeneralizing." Patient identified a situation in his personal life where he has cognitively distorted thinking and was able to verbalize and challenged this cognitive distortion through positive thinking skills. Patient was able to provide support and validation to other group members.      Therapeutic Modalities:   Cognitive Behavioral Therapy Motivational Interviewing   Tu Bayle  CUEBAS-COLON, LCSW 10/15/2018 10:34 AM

## 2018-10-15 NOTE — Plan of Care (Signed)
Patient  knowledgeable of information received , verbalized understanding  of information received . Able to attend Treatment  Team  discussed  understanding of diagnoses  and goal for  being here.  Aware of life style  changes Voice of no safety concerns  Encourage good hand  washing   ] Problem: Education: Goal: Knowledge of [facility] Education information/materials will improve Outcome: Progressing   Problem: Education: Goal: Knowledge of disease or condition will improve Outcome: Progressing Goal: Understanding of discharge needs will improve Outcome: Progressing   Problem: Health Behavior/Discharge Planning: Goal: Ability to identify changes in lifestyle to reduce recurrence of condition will improve Outcome: Progressing Goal: Identification of resources available to assist in meeting health care needs will improve Outcome: Progressing   Problem: Physical Regulation: Goal: Complications related to the disease process, condition or treatment will be avoided or minimized Outcome: Progressing   Problem: Safety: Goal: Ability to remain free from injury will improve Outcome: Progressing

## 2018-10-15 NOTE — Progress Notes (Signed)
D: Patient stated slept good last night .Stated appetitefair and energy level  lowl. Stated concentration is good . Stated on Depression scale 8 , hopeless and anxiety 6 .( low 0-10 high) Denies suicidal  homicidal ideations  .  No auditory hallucinations  No pain concerns . Appropriate ADL'S. Interacting with peers and staff. Patient  knowledgeable of information received , verbalized understanding  of information received . Able to attend Treatment  Team  discussed  understanding of diagnoses  and goal for  being here.  Aware of life style  changes Voice of no safety concerns  Encourage good hand  washing . Working on his attitude. A: Encourage patient participation with unit programming . Instruction  Given on  Medication , verbalize understanding. R: Voice no other concerns. Staff continue to monitor

## 2018-10-15 NOTE — Progress Notes (Signed)
Curtis Cobb visited with his family members who seemed to be supportive. Patient was pleasant and cooperative, reporting that  He is feeling "much better". Reports that he is taking the right medications. Motivated for outpatient services.  Reports that he is more involved in unit activities and willing to do more. No concern at this moment. Will continue to monitor.

## 2018-10-16 MED ORDER — CHLORDIAZEPOXIDE HCL 10 MG PO CAPS
10.0000 mg | ORAL_CAPSULE | Freq: Three times a day (TID) | ORAL | Status: DC
  Administered 2018-10-16 – 2018-10-18 (×5): 10 mg via ORAL
  Filled 2018-10-16 (×5): qty 1

## 2018-10-16 MED ORDER — CHLORDIAZEPOXIDE HCL 25 MG PO CAPS
25.0000 mg | ORAL_CAPSULE | Freq: Three times a day (TID) | ORAL | Status: DC
  Administered 2018-10-16: 25 mg via ORAL
  Filled 2018-10-16: qty 1

## 2018-10-16 MED ORDER — PRAZOSIN HCL 1 MG PO CAPS
1.0000 mg | ORAL_CAPSULE | Freq: Two times a day (BID) | ORAL | Status: DC
  Administered 2018-10-16 – 2018-10-18 (×3): 1 mg via ORAL
  Filled 2018-10-16 (×5): qty 1

## 2018-10-16 NOTE — Progress Notes (Addendum)
[facility] MD Progress Note  10/16/2018 10:15 AM Alisha Burgo  MRN:  [id number]   Subjective:    Mr. Krenz has a history of depression, mood instability and incapacitating anxiety.   Pt still isolative in his room, withdrawn,  and less  anxious, pt  reports feeling better than before, reports anxiety on and off esp in front of others,  denies SI, denies AVH, taking meds , denies side effects, slept well. On contact precaution for MRSA.  Principal Problem: Schizoaffective disorder, depressive type (HCC) Diagnosis:   Patient Active Problem List   Diagnosis Date Noted  . Tobacco use disorder [F17.200] 10/09/2018  . Cellulitis [L03.90] 08/23/2018  . 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 Sedative, hypnotic or anxiolytic use disorder, severe, dependence (HCC) [F13.20] 03/30/2016  . MDD (major depressive disorder), recurrent episode, severe (HCC) [F33.2] 11/17/2015  . Opioid use disorder, mild, in sustained remission (HCC) [F11.11] 10/08/2015  . Alcohol use disorder, severe, dependence (HCC) [F10.20] 10/07/2015  . Cannabis use disorder, moderate, dependence (HCC) [F12.20] 10/07/2015  . HTN (hypertension) [I10] 10/07/2015  . Rheumatoid arthritis (HCC) [M06.9] 10/07/2015  . Schizoaffective disorder, depressive type (HCC) [F25.1]    Total Time spent with patient: 25 min  Past Psychiatric History: depression, anxiety, amphetamine abuse  Past Medical History:  Past Medical History:  Diagnosis Date  . Alcohol use disorder, moderate, dependence (HCC) 02/13/2014  . Anxiety   . Depression   . Hypertension   . Rheumatoid arthritis (HCC)   . Substance abuse [facility])     Past Surgical History:  Procedure Laterality Date  . INCISION AND DRAINAGE ABSCESS Right 08/25/2018   Procedure: INCISION AND DRAINAGE ABSCESS;  Surgeon: Christena Flake, MD;  Location: [facility] ORS;  Service: Orthopedics;  Laterality: Right;  . INCISION AND DRAINAGE ABSCESS Right 08/30/2018   Procedure: INCISION AND  DRAINAGE ABSCESS;  Surgeon: Christena Flake, MD;  Location: [facility] ORS;  Service: Orthopedics;  Laterality: Right;  . none     Family History:  Family History  Problem Relation Age of Onset  . Heart attack Mother   . Stroke Mother   . Hypertension Mother   . Mental illness Mother        Manic Depressive Bipolar Disorder  . Heart disease Mother   . Hypertension Maternal Aunt   . Mental illness Maternal Aunt   . Hypertension Maternal Aunt   . Mental illness Maternal Aunt    Family Psychiatric  History: none Social History:  Social History   Substance and Sexual Activity  Alcohol Use Yes   Comment: drank last pm      Social History   Substance and Sexual Activity  Drug Use No  . Types: Amphetamines, Marijuana, Cocaine, Heroin   Comment: one month ago?     Social History   Socioeconomic History  . Marital status: Single    Spouse name: Not on file  . Number of children: Not on file  . Years of education: Not on file  . Highest education level: Not on file  Occupational History  . Not on file  Social Needs  . Financial resource strain: Not on file  . Food insecurity:    Worry: Not on file    Inability: Not on file  . Transportation needs:    Medical: Not on file    Non-medical: Not on file  Tobacco Use  . Smoking status: Current Every Day Smoker    Packs/day: 0.50    Years: 0.00  Pack years: 0.00  . Smokeless tobacco: Never Used  Substance and Sexual Activity  . Alcohol use: Yes    Comment: drank last pm   . Drug use: No    Types: Amphetamines, Marijuana, Cocaine, Heroin    Comment: one month ago?   Marland Kitchen Sexual activity: Yes    Birth control/protection: None  Lifestyle  . Physical activity:    Days per week: Not on file    Minutes per session: Not on file  . Stress: Not on file  Relationships  . Social connections:    Talks on phone: Not on file    Gets together: Not on file    Attends religious service: Not on file    Active member of club or  organization: Not on file    Attends meetings of clubs or organizations: Not on file    Relationship status: Not on file  Other Topics Concern  . Not on file  Social History Narrative  . Not on file   Additional Social History:                         Sleep: Poor  Appetite:  Poor  Current Medications: Current Facility-Administered Medications  Medication Dose Route Frequency Provider Last Rate Last Dose  . acetaminophen (TYLENOL) tablet 650 mg  650 mg Oral Q6H PRN Pucilowska, Jolanta B, MD      . alum & mag hydroxide-simeth (MAALOX/MYLANTA) 200-200-20 MG/5ML suspension 30 mL  30 mL Oral Q4H PRN Pucilowska, Jolanta B, MD      . amitriptyline (ELAVIL) tablet 100 mg  100 mg Oral QHS Pucilowska, Jolanta B, MD   100 mg at 10/15/18 2138  . chlordiazePOXIDE (LIBRIUM) capsule 25 mg  25 mg Oral QID Pucilowska, Jolanta B, MD   25 mg at 10/16/18 0755  . divalproex (DEPAKOTE) DR tablet 500 mg  500 mg Oral Q12H Pucilowska, Jolanta B, MD   500 mg at 10/16/18 0755  . fluvoxaMINE (LUVOX) tablet 100 mg  100 mg Oral QHS Pucilowska, Jolanta B, MD   100 mg at 10/15/18 2138  . folic acid (FOLVITE) tablet 1 mg  1 mg Oral Daily Pucilowska, Jolanta B, MD   1 mg at 10/16/18 0755  . levothyroxine (SYNTHROID, LEVOTHROID) tablet 25 mcg  25 mcg Oral QAC breakfast Pucilowska, Jolanta B, MD   25 mcg at 10/16/18 0755  . magnesium hydroxide (MILK OF MAGNESIA) suspension 30 mL  30 mL Oral Daily PRN Pucilowska, Jolanta B, MD      . nicotine (NICODERM CQ - dosed in mg/24 hours) patch 21 mg  21 mg Transdermal Q0600 Pucilowska, Jolanta B, MD   21 mg at 10/16/18 0755  . prazosin (MINIPRESS) capsule 2 mg  2 mg Oral BID Pucilowska, Jolanta B, MD   2 mg at 10/15/18 1706  . QUEtiapine (SEROQUEL) tablet 300 mg  300 mg Oral QHS Pucilowska, Jolanta B, MD   300 mg at 10/15/18 2138    Lab Results:  Results for orders placed or performed during the hospital encounter of 10/09/18 (from the past 48 hour(s))  T4, free      Status: Abnormal   Collection Time: 10/14/18  3:03 PM  Result Value Ref Range   Free T4 0.57 (L) 0.82 - 1.77 ng/dL    Comment: (NOTE) Biotin ingestion may interfere with free T4 tests. If the results are inconsistent with the TSH level, previous test results, or the clinical presentation, then consider biotin interference.  If needed, order repeat testing after stopping biotin. Performed at [facility], [address] [place], [place] [address]   Valproic acid level     Status: None   Collection Time: 10/15/18  6:47 AM  Result Value Ref Range   Valproic Acid Lvl 60 50.0 - 100.0 ug/mL    Comment: Performed at [facility], [address] [place], [place] [address]  Ammonia     Status: None   Collection Time: 10/15/18  6:47 AM  Result Value Ref Range   Ammonia 15 9 - 35 umol/L    Comment: Performed at [facility], [address] [place], [place] [address]  TSH     Status: Abnormal   Collection Time: 10/15/18  6:47 AM  Result Value Ref Range   TSH 9.437 (H) 0.350 - 4.500 uIU/mL    Comment: Performed by a 3rd Generation assay with a functional sensitivity of <=0.01 uIU/mL. Performed at [facility], [address] [place], [place] [address]     Blood Alcohol level:  Lab Results  Component Value Date   [facility] <10 10/08/2018   ETH <5 07/09/2016    Metabolic Disorder Labs: Lab Results  Component Value Date   HGBA1C 5.2 10/08/2018   MPG 102.54 10/08/2018   Lab Results  Component Value Date   PROLACTIN 13.1 07/10/2016   Lab Results  Component Value Date   CHOL 191 10/08/2018   TRIG 83 10/08/2018   HDL 33 (L) 10/08/2018   CHOLHDL 5.8 10/08/2018   VLDL 17 10/08/2018   LDLCALC 141 (H) 10/08/2018   LDLCALC 98 07/10/2016    Physical Findings: AIMS: Facial and Oral Movements Muscles of Facial Expression: None, normal Lips and Perioral Area: None, normal Jaw: None, normal Tongue: None, normal,Extremity Movements Upper (arms,  wrists, hands, fingers): None, normal Lower (legs, knees, ankles, toes): None, normal, Trunk Movements Neck, shoulders, hips: None, normal, Overall Severity Severity of abnormal movements (highest score from questions above): None, normal Incapacitation due to abnormal movements: None, normal Patient's awareness of abnormal movements (rate only patient's report): No Awareness, Dental Status Current problems with teeth and/or dentures?: No Does patient usually wear dentures?: No  CIWA:  CIWA-Ar Total: 0 COWS:  COWS Total Score: 1  Musculoskeletal: Strength & Muscle Tone: within normal limits Gait & Station: normal Patient leans: N/A  Psychiatric Specialty Exam: Physical Exam  Nursing note and vitals reviewed. Psychiatric: Thought content normal. His mood appears anxious. His affect is blunt. His speech is delayed and tangential. He is slowed and withdrawn. Cognition and memory are impaired. He expresses impulsivity. He exhibits a depressed mood.    Review of Systems  Neurological: Negative.   Psychiatric/Behavioral: Positive for depression and substance abuse. The patient is nervous/anxious.   All other systems reviewed and are negative.   Blood pressure (!) 77/63, pulse (!) 110, temperature 97.6 F (36.4 C), temperature source Oral, resp. rate 18, height 6\' 2"  (1.88 m), weight 82.6 kg, SpO2 95 %.Body mass index is 23.37 kg/m.  General Appearance: Casual, withdrwan  Eye Contact:  Fair  Speech:  Slow  Volume:  Decreased  Mood:  better  Affect:  restricted  Thought Process:  Irrelevant  Orientation:  Full (Time, Place, and Person)  Thought Content:  WDL  Suicidal Thoughts:  No  Homicidal Thoughts:  No  Memory:  Immediate;   Fair Recent;   Fair Remote;   Fair  Judgement:  Poor  Insight:  Lacking  Psychomotor Activity:  Psychomotor  Retardation  Concentration:  Concentration: Fair and Attention Span: Fair  Recall:  [employer] of Knowledge:  Fair  Language:  Fair   Akathisia:  No  Handed:  Right  AIMS (if indicated):     Assets:  Communication Skills Desire for Improvement Housing Intimacy Physical Health Resilience Social Support  ADL's:  Intact  Cognition:  WNL  Sleep:  Number of Hours: 7     Treatment Plan Summary: Daily contact with patient to assess and evaluate symptoms and progress in treatment and Medication management   Mr. Rowlison is a 47 year old male with a history of bipolar disorder and substance abuse admitted for psychotic break in the context of substance use and treatment noncompliance. Psychosis improving, pt withdrawan.  Decrease prazosin for low BP, tapering librium.  #Mood andPsychosis -continueSeroquel 300 mg nightly -continueDepakote 500 mg BID, level- 60 -continue Elavil 100 mg nightly  #Detox -Librium taper   #Anxiety/ptsd -continue Luvox to 100 mg nightlyOCD - decrease Minipressas 1 mg BIDdue to low BP   #Substance abuse -positve for amphetamines and cannabis -minimizes problem   #Elevated TSH with low T4 - Synthroid 25 ugm   #Smoking cessation -nicotine patch is available  #Labs -lipid panel, and A1C normal, TSH slightly elevated, will recheck  -EKG  #Disposition -discharge with his girlfriend -follow up with [facility]  Beverly Sessions, MD 10/16/2018, 10:15 AMPatient ID: [name], male   DOB: 1971-11-12, 47 y.o.   MRN: [id number] Patient ID: Tajiddin Schielke, male   DOB: 1971/02/20, 47 y.o.   MRN: [id number]

## 2018-10-16 NOTE — [facility] Group Notes (Signed)
LCSW Group Therapy Note 10/16/2018 1:15pm  Type of Therapy and Topic: Group Therapy: Feelings Around Returning Home & Establishing a Supportive Framework and Supporting Oneself When Supports Not Available  Participation Level: Active  Description of Group:  Patients first processed thoughts and feelings about upcoming discharge. These included fears of upcoming changes, lack of change, new living environments, judgements and expectations from others and overall stigma of mental health issues. The group then discussed the definition of a supportive framework, what that looks and feels like, and how do to discern it from an unhealthy non-supportive network. The group identified different types of supports as well as what to do when your family/friends are less than helpful or unavailable  Therapeutic Goals  1. Patient will identify one healthy supportive network that they can use at discharge. 2. Patient will identify one factor of a supportive framework and how to tell it from an unhealthy network. 3. Patient able to identify one coping skill to use when they do not have positive supports from others. 4. Patient will demonstrate ability to communicate their needs through discussion and/or role plays.  Summary of Patient Progress:  The patient reported he feels "pretty decent, better each day." Pt engaged during group session. As patients processed their anxiety about discharge and described healthy supports patient shared he is not ready to be discharge. He listed his friends as his main support system.  Patients identified at least one self-care tool they were willing to use after discharge.   Therapeutic Modalities Cognitive Behavioral Therapy Motivational Interviewing   Curtis Cobb  CUEBAS-COLON, LCSW 10/16/2018 12:02 PM

## 2018-10-16 NOTE — Progress Notes (Signed)
[facility] MD Progress Note  10/16/2018 1:11 PM Curtis Cobb  MRN:  [id number]  Subjective:   Curtis Cobb is feeling slightly better but still withdrawn to his room most of the time. Has a hard time participating in groups. He is tolerates medications well but BP is low in spite of cutting back on Minipress. Wants off the Luvox and on Prozac Wants lower dose of Seroquel as well. He is not suicidal or homicidal but wants to continue on his "body and mind connection". Discharge tomorrow.   Principal Problem: Schizoaffective disorder, depressive type (HCC) Diagnosis:   Patient Active Problem List   Diagnosis Date Noted  . Schizoaffective disorder, depressive type (HCC) [F25.1]     Priority: High  . Tobacco use disorder [F17.200] 10/09/2018  . Cellulitis [L03.90] 08/23/2018  . 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 Sedative, hypnotic or anxiolytic use disorder, severe, dependence (HCC) [F13.20] 03/30/2016  . MDD (major depressive disorder), recurrent episode, severe (HCC) [F33.2] 11/17/2015  . Opioid use disorder, mild, in sustained remission (HCC) [F11.11] 10/08/2015  . Alcohol use disorder, severe, dependence (HCC) [F10.20] 10/07/2015  . Cannabis use disorder, moderate, dependence (HCC) [F12.20] 10/07/2015  . HTN (hypertension) [I10] 10/07/2015  . Rheumatoid arthritis (HCC) [M06.9] 10/07/2015   Total Time spent with patient: 20 minutes  Past Psychiatric History: depression, anxiety, substance abuse.  Past Medical History:  Past Medical History:  Diagnosis Date  . Alcohol use disorder, moderate, dependence (HCC) 02/13/2014  . Anxiety   . Depression   . Hypertension   . Rheumatoid arthritis (HCC)   . Substance abuse [facility])     Past Surgical History:  Procedure Laterality Date  . INCISION AND DRAINAGE ABSCESS Right 08/25/2018   Procedure: INCISION AND DRAINAGE ABSCESS;  Surgeon: Christena Flake, MD;  Location: [facility] ORS;  Service: Orthopedics;  Laterality: Right;  .  INCISION AND DRAINAGE ABSCESS Right 08/30/2018   Procedure: INCISION AND DRAINAGE ABSCESS;  Surgeon: Christena Flake, MD;  Location: [facility] ORS;  Service: Orthopedics;  Laterality: Right;  . none     Family History:  Family History  Problem Relation Age of Onset  . Heart attack Mother   . Stroke Mother   . Hypertension Mother   . Mental illness Mother        Manic Depressive Bipolar Disorder  . Heart disease Mother   . Hypertension Maternal Aunt   . Mental illness Maternal Aunt   . Hypertension Maternal Aunt   . Mental illness Maternal Aunt    Family Psychiatric  History: none. Social History:  Social History   Substance and Sexual Activity  Alcohol Use Yes   Comment: drank last pm      Social History   Substance and Sexual Activity  Drug Use No  . Types: Amphetamines, Marijuana, Cocaine, Heroin   Comment: one month ago?     Social History   Socioeconomic History  . Marital status: Single    Spouse name: Not on file  . Number of children: Not on file  . Years of education: Not on file  . Highest education level: Not on file  Occupational History  . Not on file  Social Needs  . Financial resource strain: Not on file  . Food insecurity:    Worry: Not on file    Inability: Not on file  . Transportation needs:    Medical: Not on file    Non-medical: Not on file  Tobacco Use  . Smoking status: Current Every  Day Smoker    Packs/day: 0.50    Years: 0.00    Pack years: 0.00  . Smokeless tobacco: Never Used  Substance and Sexual Activity  . Alcohol use: Yes    Comment: drank last pm   . Drug use: No    Types: Amphetamines, Marijuana, Cocaine, Heroin    Comment: one month ago?   Marland Kitchen Sexual activity: Yes    Birth control/protection: None  Lifestyle  . Physical activity:    Days per week: Not on file    Minutes per session: Not on file  . Stress: Not on file  Relationships  . Social connections:    Talks on phone: Not on file    Gets together: Not on file     Attends religious service: Not on file    Active member of club or organization: Not on file    Attends meetings of clubs or organizations: Not on file    Relationship status: Not on file  Other Topics Concern  . Not on file  Social History Narrative  . Not on file   Additional Social History:                         Sleep: Fair  Appetite:  Fair  Current Medications: Current Facility-Administered Medications  Medication Dose Route Frequency Provider Last Rate Last Dose  . acetaminophen (TYLENOL) tablet 650 mg  650 mg Oral Q6H PRN Cherrise Occhipinti B, MD      . alum & mag hydroxide-simeth (MAALOX/MYLANTA) 200-200-20 MG/5ML suspension 30 mL  30 mL Oral Q4H PRN Dorlisa Savino B, MD      . amitriptyline (ELAVIL) tablet 100 mg  100 mg Oral QHS Alyas Creary B, MD   100 mg at 10/15/18 2138  . chlordiazePOXIDE (LIBRIUM) capsule 10 mg  10 mg Oral TID Pasha Gadison B, MD      . divalproex (DEPAKOTE) DR tablet 500 mg  500 mg Oral Q12H Loraine Freid B, MD   500 mg at 10/16/18 0755  . fluvoxaMINE (LUVOX) tablet 100 mg  100 mg Oral QHS Myrla Malanowski B, MD   100 mg at 10/15/18 2138  . folic acid (FOLVITE) tablet 1 mg  1 mg Oral Daily Daryle Amis B, MD   1 mg at 10/16/18 0755  . levothyroxine (SYNTHROID, LEVOTHROID) tablet 25 mcg  25 mcg Oral QAC breakfast Ronella Plunk B, MD   25 mcg at 10/16/18 0755  . magnesium hydroxide (MILK OF MAGNESIA) suspension 30 mL  30 mL Oral Daily PRN Anetria Harwick B, MD      . nicotine (NICODERM CQ - dosed in mg/24 hours) patch 21 mg  21 mg Transdermal Q0600 Jeslynn Hollander B, MD   21 mg at 10/16/18 0755  . prazosin (MINIPRESS) capsule 1 mg  1 mg Oral BID Beverly Sessions, MD      . QUEtiapine (SEROQUEL) tablet 300 mg  300 mg Oral QHS Moni Rothrock B, MD   300 mg at 10/15/18 2138    Lab Results:  Results for orders placed or performed during the hospital encounter of 10/09/18 (from the past 48  hour(s))  T4, free     Status: Abnormal   Collection Time: 10/14/18  3:03 PM  Result Value Ref Range   Free T4 0.57 (L) 0.82 - 1.77 ng/dL    Comment: (NOTE) Biotin ingestion may interfere with free T4 tests. If the results are inconsistent with the TSH level, previous test results,  or the clinical presentation, then consider biotin interference. If needed, order repeat testing after stopping biotin. Performed at [facility], [address] [place], [place] [address]   Valproic acid level     Status: None   Collection Time: 10/15/18  6:47 AM  Result Value Ref Range   Valproic Acid Lvl 60 50.0 - 100.0 ug/mL    Comment: Performed at [facility], [address] [place], [place] [address]  Ammonia     Status: None   Collection Time: 10/15/18  6:47 AM  Result Value Ref Range   Ammonia 15 9 - 35 umol/L    Comment: Performed at [facility], [address] [place], [place] [address]  TSH     Status: Abnormal   Collection Time: 10/15/18  6:47 AM  Result Value Ref Range   TSH 9.437 (H) 0.350 - 4.500 uIU/mL    Comment: Performed by a 3rd Generation assay with a functional sensitivity of <=0.01 uIU/mL. Performed at [facility], [address] [place], [place] [address]     Blood Alcohol level:  Lab Results  Component Value Date   [facility] <10 10/08/2018   ETH <5 07/09/2016    Metabolic Disorder Labs: Lab Results  Component Value Date   HGBA1C 5.2 10/08/2018   MPG 102.54 10/08/2018   Lab Results  Component Value Date   PROLACTIN 13.1 07/10/2016   Lab Results  Component Value Date   CHOL 191 10/08/2018   TRIG 83 10/08/2018   HDL 33 (L) 10/08/2018   CHOLHDL 5.8 10/08/2018   VLDL 17 10/08/2018   LDLCALC 141 (H) 10/08/2018   LDLCALC 98 07/10/2016    Physical Findings: AIMS: Facial and Oral Movements Muscles of Facial Expression: None, normal Lips and Perioral Area: None, normal Jaw: None, normal Tongue: None, normal,Extremity  Movements Upper (arms, wrists, hands, fingers): None, normal Lower (legs, knees, ankles, toes): None, normal, Trunk Movements Neck, shoulders, hips: None, normal, Overall Severity Severity of abnormal movements (highest score from questions above): None, normal Incapacitation due to abnormal movements: None, normal Patient's awareness of abnormal movements (rate only patient's report): No Awareness, Dental Status Current problems with teeth and/or dentures?: No Does patient usually wear dentures?: No  CIWA:  CIWA-Ar Total: 0 COWS:  COWS Total Score: 1  Musculoskeletal: Strength & Muscle Tone: within normal limits Gait & Station: normal Patient leans: N/A  Psychiatric Specialty Exam: Physical Exam  Nursing note and vitals reviewed. Psychiatric: His speech is normal. Thought content normal. His mood appears anxious. His affect is blunt. He is withdrawn. Cognition and memory are normal. He expresses impulsivity. He exhibits a depressed mood.    Review of Systems  Neurological: Negative.   Psychiatric/Behavioral: Positive for depression. The patient is nervous/anxious.   All other systems reviewed and are negative.   Blood pressure 100/78, pulse (!) 110, temperature 98.2 F (36.8 C), temperature source Oral, resp. rate 18, height 6\' 2"  (1.88 m), weight 82.6 kg, SpO2 98 %.Body mass index is 23.37 kg/m.  General Appearance: Casual  Eye Contact:  Good  Speech:  Clear and Coherent  Volume:  Normal  Mood:  Anxious  Affect:  Flat  Thought Process:  Goal Directed and Descriptions of Associations: Intact  Orientation:  Full (Time, Place, and Person)  Thought Content:  WDL  Suicidal Thoughts:  No  Homicidal Thoughts:  No  Memory:  Immediate;   Fair Recent;   Fair Remote;   Fair  Judgement:  Impaired  Insight:  Shallow  Psychomotor Activity:  Decreased  Concentration:  Concentration: Fair and Attention Span: Fair  Recall:  [employer] of Knowledge:  Fair  Language:  Fair   Akathisia:  No  Handed:  Right  AIMS (if indicated):     Assets:  Communication Skills Desire for Improvement Housing Intimacy Physical Health Resilience Social Support  ADL's:  Intact  Cognition:  WNL  Sleep:  Number of Hours: 7     Treatment Plan Summary: Daily contact with patient to assess and evaluate symptoms and progress in treatment and Medication management   Curtis Cobb is a 47 year old male with a history of bipolar disorder and substance abuse admitted for psychotic break in the context of substance use and treatment noncompliance.   #Mood/Psychosis, improved/resolved -lower Seroquel 200 mg nightly -continueDepakote 500 mg BID, VPA level 60 -continue Elavil 100 mg nightly  #Detox -lower Librium to 10 mg QID    #Anxiety/ptsd -does not want Luvox -start Prozac 40 mg daily  -continue Minipress1mg  BID   #Substance abuse -positve for amphetamines and cannabis -minimizes problem and declines treatment   #Elevated TSH with low T4 - Synthroid 25 ugm  #Smoking cessation -nicotine patch is available  #Labs -lipid panel, and A1C normal -EKG reviewed, NSR with QTc of 435  #Disposition -discharge with his girlfriend tomorrow -follow up with [facility]  Curtis Linea, MD 10/16/2018, 1:11 PM

## 2018-10-16 NOTE — Plan of Care (Addendum)
Pleasant and cooperative. Reports that the medications are helping him. Affect is brighter,. Patient is more visible and active in the milieu. Denying thoughts of self harm. Denying hallucinations. Reports that he received a phone call from his support sytem. Patient stayed in the dayroom, had a snack and received medications. Has no concerns so far. Patient currently in bed. Safety monitored per Q 15 mn level of observation.

## 2018-10-16 NOTE — Plan of Care (Signed)
Patient A&O x 4. Up ad lib ambulating the unit for meals and medications, patient otherwise in bed resting with his eyes. Denies pain, SI/HI/AVH. Does not attend group and not interactive with peer on the unit.

## 2018-10-17 DIAGNOSIS — E039 Hypothyroidism, unspecified: Secondary | ICD-10-CM | POA: Diagnosis present

## 2018-10-17 MED ORDER — FLUOXETINE HCL 20 MG PO CAPS
40.0000 mg | ORAL_CAPSULE | Freq: Every day | ORAL | Status: DC
  Administered 2018-10-17 – 2018-10-18 (×2): 40 mg via ORAL
  Filled 2018-10-17 (×2): qty 2

## 2018-10-17 MED ORDER — FLUOXETINE HCL 40 MG PO CAPS: 40.0000 mg | ORAL_CAPSULE | Freq: Every day | ORAL | 3 refills | Status: DC

## 2018-10-17 MED ORDER — AMITRIPTYLINE HCL 100 MG PO TABS: 100.0000 mg | ORAL_TABLET | Freq: Every day | ORAL | 1 refills | Status: DC

## 2018-10-17 MED ORDER — QUETIAPINE FUMARATE 200 MG PO TABS
200.0000 mg | ORAL_TABLET | Freq: Every day | ORAL | Status: DC
  Administered 2018-10-17: 200 mg via ORAL
  Filled 2018-10-17: qty 1

## 2018-10-17 MED ORDER — DIVALPROEX SODIUM 500 MG PO DR TAB: 500.0000 mg | DELAYED_RELEASE_TABLET | Freq: Two times a day (BID) | ORAL | 1 refills | Status: DC

## 2018-10-17 MED ORDER — QUETIAPINE FUMARATE 200 MG PO TABS: 200.0000 mg | ORAL_TABLET | Freq: Every day | ORAL | 1 refills | Status: DC

## 2018-10-17 MED ORDER — LEVOTHYROXINE SODIUM 25 MCG PO TABS: 25.0000 ug | ORAL_TABLET | Freq: Every day | ORAL | 1 refills | Status: DC

## 2018-10-17 MED ORDER — PRAZOSIN HCL 1 MG PO CAPS: 1.0000 mg | ORAL_CAPSULE | Freq: Two times a day (BID) | ORAL | 1 refills | Status: DC

## 2018-10-17 NOTE — Progress Notes (Signed)
Recreation Therapy Notes  Date: 10/17/2018  Time: 9:30 am   Location: Craft room   Behavioral response: N/A   Intervention Topic: Stress  Discussion/Intervention: Patient did not attend group.   Clinical Observations/Feedback:  Patient did not attend group.   Curtis Cobb LRT/CTRS        [name] 10/17/2018 12:21 PM 

## 2018-10-17 NOTE — [facility] Group Notes (Signed)
[facility] Group Notes:  (Nursing/MHT/Case Management/Adjunct)  Date:  10/17/2018  Time:  10:02 AM  Type of Therapy:  Psychoeducational Skills  Participation Level:  Active  Participation Quality:  Appropriate and Attentive  Affect:  Blunted and Flat  Cognitive:  Alert and Appropriate  Insight:  Good  Engagement in Group:  Limited  Modes of Intervention:  Discussion, Education and Exploration  Summary of Progress/Problems:  Maudell Stanbrough R Dossie Swor 10/17/2018, 10:02 AM

## 2018-10-17 NOTE — Plan of Care (Signed)
Patient A&O x 4. Up ad lib ambulating the unit for meals and medications, patient otherwise in bed resting with his eyes. Denies pain, SI/HI/AVH. Does not attend group and not interactive with peer on the unit. 

## 2018-10-17 NOTE — [facility] Suicide Risk Assessment (Signed)
[facility] Discharge Suicide Risk Assessment   Principal Problem: Schizoaffective disorder, depressive type [facility]) Discharge Diagnoses:  Patient Active Problem List   Diagnosis Date Noted  . Schizoaffective disorder, depressive type (HCC) [F25.1]     Priority: High  . Hypothyroidism [E03.9] 10/17/2018  . Tobacco use disorder [F17.200] 10/09/2018  . Cellulitis [L03.90] 08/23/2018  . 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 Sedative, hypnotic or anxiolytic use disorder, severe, dependence (HCC) [F13.20] 03/30/2016  . MDD (major depressive disorder), recurrent episode, severe (HCC) [F33.2] 11/17/2015  . Opioid use disorder, mild, in sustained remission (HCC) [F11.11] 10/08/2015  . Alcohol use disorder, severe, dependence (HCC) [F10.20] 10/07/2015  . Cannabis use disorder, moderate, dependence (HCC) [F12.20] 10/07/2015  . HTN (hypertension) [I10] 10/07/2015  . Rheumatoid arthritis (HCC) [M06.9] 10/07/2015    Total Time spent with patient: 20 minutes  Musculoskeletal: Strength & Muscle Tone: within normal limits Gait & Station: normal Patient leans: N/A  Psychiatric Specialty Exam: Review of Systems  Neurological: Negative.   Psychiatric/Behavioral: Positive for depression and substance abuse.  All other systems reviewed and are negative.   Blood pressure 117/89, pulse 100, temperature 97.9 F (36.6 C), temperature source Oral, resp. rate 16, height 6\' 2"  (1.88 m), weight 82.6 kg, SpO2 98 %.Body mass index is 23.37 kg/m.  General Appearance: Casual  Eye Contact::  Good  Speech:  Clear and Coherent409  Volume:  Normal  Mood:  Depressed  Affect:  Flat  Thought Process:  Goal Directed and Descriptions of Associations: Intact  Orientation:  Full (Time, Place, and Person)  Thought Content:  WDL  Suicidal Thoughts:  No  Homicidal Thoughts:  No  Memory:  Immediate;   Fair Recent;   Fair Remote;   Fair  Judgement:  Impaired  Insight:  Shallow  Psychomotor Activity:   Decreased  Concentration:  Fair  Recall:  [phone] of Knowledge:Fair  Language: Fair  Akathisia:  No  Handed:  Right  AIMS (if indicated):     Assets:  Communication Skills Desire for Improvement Housing Intimacy Physical Health Resilience Social Support  Sleep:  Number of Hours: 8.15  Cognition: WNL  ADL's:  Intact   Mental Status Per Nursing Assessment::   On Admission:  NA  Demographic Factors:  Male, Caucasian, Low socioeconomic status and Unemployed  Loss Factors: NA  Historical Factors: Impulsivity  Risk Reduction Factors:   Sense of responsibility to family, Living with another person, especially a relative and Positive social support  Continued Clinical Symptoms:  Bipolar Disorder:   Depressive phase Depression:   Impulsivity Alcohol/Substance Abuse/Dependencies  Cognitive Features That Contribute To Risk:  None    Suicide Risk:  Minimal: No identifiable suicidal ideation.  Patients presenting with no risk factors but with morbid ruminations; may be classified as minimal risk based on the severity of the depressive symptoms  Follow-up Information    [phone] Go on 10/24/2018.   Why:  Please follow up with 10/26/2018 on Monday October 24, 2018 for peer support services. He will come and pick you up at 7am. His number is [phone]. Thank you. Contact information: [address] [place] [id number]           Plan Of Care/Follow-up recommendations:  Activity:  as tolerated Diet:  low sodium heart healthy Other:  keep follow up appointments  [phone], MD 10/18/2018, 10:45 AM

## 2018-10-17 NOTE — [facility] Group Notes (Signed)
[facility] Group Notes:  (Nursing/MHT/Case Management/Adjunct)  Date:  10/17/2018  Time:  11:37 PM  Type of Therapy:  Group Therapy  Participation Level:  Active  Participation Quality:  Appropriate  Affect:  Appropriate  Cognitive:  Appropriate  Insight:  Appropriate  Engagement in Group:  Engaged  Modes of Intervention:  Discussion  Summary of Progress/Problems:  Curtis Cobb 10/17/2018, 11:37 PM

## 2018-10-17 NOTE — Discharge Summary (Signed)
Physician Discharge Summary Note  Patient:  Curtis Cobb is an 47 y.o., male MRN:  [id number] DOB:  [birth year] Patient phone:  [phone] (home)  Patient address:   [address] [place] [address],  Total Time spent with patient: 20 minutes plus 15 min on care coordination and documentation  Date of Admission:  10/09/2018 Date of Discharge: 10/18/2018  Reason for Admission:  Suicidal ideation.  History of Present Illness:   Identifying data. Curtis Cobb is a 47 year old male with a history of schizoaffective disorder.  Chief complaint. The patient unable to state.  History of present illness. Information was obtained from the patient and the chart. The patient was brought to the ER by his girlfriend "to get help". The patient was initially unable to provide any sensible informations. He was very frightened and actively hallucinating, with both visual and auditory hallucinations. There was tremendous latency of response and inability to focus on simple issues. He admitted being noncompliant with treatment, abusing substances "recreationally", and wanting "psychotherapy" for his problems. He is no suicidal or homicida. UDS positive for amphetamines and cannabis.  Past psychiatric history. Prior hospitalizations for depressoin, substances and bipolar.  Family psychiatric history. None reported.  Socisl history. Has support from his girlfriend and her mother.   Principal Problem: Schizoaffective disorder, depressive type [facility]) Discharge Diagnoses: Patient Active Problem List   Diagnosis Date Noted  . Schizoaffective disorder, depressive type (HCC) [F25.1]     Priority: High  . Hypothyroidism [E03.9] 10/17/2018  . Tobacco use disorder [F17.200] 10/09/2018  . Cellulitis [L03.90] 08/23/2018  . 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 Sedative, hypnotic or anxiolytic use disorder, severe, dependence (HCC) [F13.20] 03/30/2016  . MDD (major depressive  disorder), recurrent episode, severe (HCC) [F33.2] 11/17/2015  . Opioid use disorder, mild, in sustained remission (HCC) [F11.11] 10/08/2015  . Alcohol use disorder, severe, dependence (HCC) [F10.20] 10/07/2015  . Cannabis use disorder, moderate, dependence (HCC) [F12.20] 10/07/2015  . HTN (hypertension) [I10] 10/07/2015  . Rheumatoid arthritis (HCC) [M06.9] 10/07/2015   Past Medical History:  Past Medical History:  Diagnosis Date  . Alcohol use disorder, moderate, dependence (HCC) 02/13/2014  . Anxiety   . Depression   . Hypertension   . Rheumatoid arthritis (HCC)   . Substance abuse [facility])     Past Surgical History:  Procedure Laterality Date  . INCISION AND DRAINAGE ABSCESS Right 08/25/2018   Procedure: INCISION AND DRAINAGE ABSCESS;  Surgeon: Christena Flake, MD;  Location: [facility] ORS;  Service: Orthopedics;  Laterality: Right;  . INCISION AND DRAINAGE ABSCESS Right 08/30/2018   Procedure: INCISION AND DRAINAGE ABSCESS;  Surgeon: Christena Flake, MD;  Location: [facility] ORS;  Service: Orthopedics;  Laterality: Right;  . none     Family History:  Family History  Problem Relation Age of Onset  . Heart attack Mother   . Stroke Mother   . Hypertension Mother   . Mental illness Mother        Manic Depressive Bipolar Disorder  . Heart disease Mother   . Hypertension Maternal Aunt   . Mental illness Maternal Aunt   . Hypertension Maternal Aunt   . Mental illness Maternal Aunt    Social History:  Social History   Substance and Sexual Activity  Alcohol Use Yes   Comment: drank last pm      Social History   Substance and Sexual Activity  Drug Use No  . Types: Amphetamines, Marijuana, Cocaine, Heroin   Comment: one month ago?  Social History   Socioeconomic History  . Marital status: Single    Spouse name: Not on file  . Number of children: Not on file  . Years of education: Not on file  . Highest education level: Not on file  Occupational History  . Not on file  Social  Needs  . Financial resource strain: Not on file  . Food insecurity:    Worry: Not on file    Inability: Not on file  . Transportation needs:    Medical: Not on file    Non-medical: Not on file  Tobacco Use  . Smoking status: Current Every Day Smoker    Packs/day: 0.50    Years: 0.00    Pack years: 0.00  . Smokeless tobacco: Never Used  Substance and Sexual Activity  . Alcohol use: Yes    Comment: drank last pm   . Drug use: No    Types: Amphetamines, Marijuana, Cocaine, Heroin    Comment: one month ago?   Marland Kitchen Sexual activity: Yes    Birth control/protection: None  Lifestyle  . Physical activity:    Days per week: Not on file    Minutes per session: Not on file  . Stress: Not on file  Relationships  . Social connections:    Talks on phone: Not on file    Gets together: Not on file    Attends religious service: Not on file    Active member of club or organization: Not on file    Attends meetings of clubs or organizations: Not on file    Relationship status: Not on file  Other Topics Concern  . Not on file  Social History Narrative  . Not on file    Hospital Course:    Curtis Cobb is a 47 year old male with a history of bipolar disorder and substance abuse admitted for a psychotic break in the context of substance use and treatment noncompliance. He was restarted on medications and tolerated them well. Psychosis resolved. At the time of discharge, the patient is not suicidal or homicidal. He is able to contract for safety in the community.  #Mood/Psychosis, improved/resolved -continue Seroquel 200 mg nightly -continueDepakote 500 mg BID, VPA level 60 -continue Elavil 100 mg nightly  #Detox -Librium taper completed    #Anxiety of PTSD -continue Prozac 40 mg daily  -continue Minipress1mg  BID, unable to tolerated higher dose due to low BP  #Substance abuse -positve for amphetamines and cannabis -minimizes problem and declines treatment   #Elevated TSH with  low T4 - Synthroid 25 ugm  #Smoking cessation -nicotine patch is available  #Labs -lipid panel, and A1C normal -EKG reviewed, NSR with QTc of 435  #Disposition -discharge to home with his girlfriend -follow up with [facility] or [facility]  Physical Findings: AIMS: Facial and Oral Movements Muscles of Facial Expression: None, normal Lips and Perioral Area: None, normal Jaw: None, normal Tongue: None, normal,Extremity Movements Upper (arms, wrists, hands, fingers): None, normal Lower (legs, knees, ankles, toes): None, normal, Trunk Movements Neck, shoulders, hips: None, normal, Overall Severity Severity of abnormal movements (highest score from questions above): None, normal Incapacitation due to abnormal movements: None, normal Patient's awareness of abnormal movements (rate only patient's report): No Awareness, Dental Status Current problems with teeth and/or dentures?: No Does patient usually wear dentures?: No  CIWA:  CIWA-Ar Total: 0 COWS:  COWS Total Score: 1  Musculoskeletal: Strength & Muscle Tone: within normal limits Gait & Station: normal Patient leans: N/A  Psychiatric Specialty Exam: Physical  Exam  Nursing note and vitals reviewed. Psychiatric: His speech is normal and behavior is normal. Thought content normal. His mood appears anxious. Cognition and memory are normal. He expresses impulsivity.    Review of Systems  Neurological: Negative.   Psychiatric/Behavioral: Positive for depression and substance abuse. The patient is nervous/anxious.   All other systems reviewed and are negative.   Blood pressure 117/89, pulse 100, temperature 97.9 F (36.6 C), temperature source Oral, resp. rate 16, height 6\' 2"  (1.88 m), weight 82.6 kg, SpO2 98 %.Body mass index is 23.37 kg/m.  General Appearance: Casual  Eye Contact:  Good  Speech:  Clear and Coherent  Volume:  Normal  Mood:  Anxious  Affect:  Appropriate  Thought Process:  Goal Directed and Descriptions of  Associations: Intact  Orientation:  Full (Time, Place, and Person)  Thought Content:  WDL  Suicidal Thoughts:  No  Homicidal Thoughts:  No  Memory:  Immediate;   Fair Recent;   Fair Remote;   Fair  Judgement:  Impaired  Insight:  Shallow  Psychomotor Activity:  Normal  Concentration:  Concentration: Fair and Attention Span: Fair  Recall:  [employer] of Knowledge:  Fair  Language:  Fair  Akathisia:  No  Handed:  Right  AIMS (if indicated):     Assets:  Communication Skills Desire for Improvement Housing Intimacy Physical Health Resilience Social Support  ADL's:  Intact  Cognition:  WNL  Sleep:  Number of Hours: 8.15     Have you used any form of tobacco in the last 30 days? (Cigarettes, Smokeless Tobacco, Cigars, and/or Pipes): No  Has this patient used any form of tobacco in the last 30 days? (Cigarettes, Smokeless Tobacco, Cigars, and/or Pipes) Yes, Yes, A prescription for an FDA-approved tobacco cessation medication was offered at discharge and the patient refused  Blood Alcohol level:  Lab Results  Component Value Date   [facility] <10 10/08/2018   ETH <5 07/09/2016    Metabolic Disorder Labs:  Lab Results  Component Value Date   HGBA1C 5.2 10/08/2018   MPG 102.54 10/08/2018   Lab Results  Component Value Date   PROLACTIN 13.1 07/10/2016   Lab Results  Component Value Date   CHOL 191 10/08/2018   TRIG 83 10/08/2018   HDL 33 (L) 10/08/2018   CHOLHDL 5.8 10/08/2018   VLDL 17 10/08/2018   LDLCALC 141 (H) 10/08/2018   LDLCALC 98 07/10/2016    See Psychiatric Specialty Exam and Suicide Risk Assessment completed by Attending Physician prior to discharge.  Discharge destination:  Home  Is patient on multiple antipsychotic therapies at discharge:  No   Has Patient had three or more failed trials of antipsychotic monotherapy by history:  No  Recommended Plan for Multiple Antipsychotic Therapies: NA  Discharge Instructions    Diet - low sodium heart healthy    Complete by:  As directed    Diet - low sodium heart healthy   Complete by:  As directed    Increase activity slowly   Complete by:  As directed    Increase activity slowly   Complete by:  As directed      Allergies as of 10/18/2018      Reactions   Sulfa Antibiotics Other (See Comments)   Unknown reaction      Medication List    STOP taking these medications   diclofenac 75 MG EC tablet Commonly known as:  VOLTAREN   ibuprofen 400 MG tablet Commonly known as:  ADVIL,MOTRIN   LORazepam 2 MG tablet Commonly known as:  ATIVAN   oxyCODONE 5 MG immediate release tablet Commonly known as:  Oxy IR/ROXICODONE     TAKE these medications     Indication  amitriptyline 100 MG tablet Commonly known as:  ELAVIL Take 1 tablet (100 mg total) by mouth at bedtime.  Indication:  Depression   divalproex 500 MG DR tablet Commonly known as:  DEPAKOTE Take 1 tablet (500 mg total) by mouth every 12 (twelve) hours.  Indication:  Depressive Phase of Manic-Depression   FLUoxetine 40 MG capsule Commonly known as:  PROZAC Take 1 capsule (40 mg total) by mouth daily.  Indication:  Depression   levothyroxine 25 MCG tablet Commonly known as:  SYNTHROID, LEVOTHROID Take 1 tablet (25 mcg total) by mouth daily before breakfast.  Indication:  Underactive Thyroid   prazosin 1 MG capsule Commonly known as:  MINIPRESS Take 1 capsule (1 mg total) by mouth 2 (two) times daily.  Indication:  PTSD   QUEtiapine 200 MG tablet Commonly known as:  SEROQUEL Take 1 tablet (200 mg total) by mouth at bedtime. What changed:    medication strength  how much to take  Indication:  Depressive Phase of Manic-Depression      Follow-up Information    [facility] Go on 10/24/2018.   Why:  Please follow up with Unk Pinto on Monday October 24, 2018 for peer support services. He will come and pick you up at 7am. His number is [phone]. Thank you. Contact information: [address] [place] [address] [phone]           Follow-up recommendations:  Activity:  as tolerated Diet:  regular Other:  keep follow up appointments  Comments:    Signed: Kristine Linea, MD 10/18/2018, 10:45 AM

## 2018-10-18 NOTE — Progress Notes (Signed)
Recreation Therapy Notes   Date: 10/18/2018  Time: 9:30 am   Location: Craft room   Behavioral response: N/A   Intervention Topic: Emotions  Discussion/Intervention: Patient did not attend group.   Clinical Observations/Feedback:  Patient did not attend group.   Arlen Legendre LRT/CTRS        Demetrus Pavao 10/18/2018 10:39 AM

## 2018-10-18 NOTE — Progress Notes (Signed)
Recreation Therapy Notes  INPATIENT RECREATION TR PLAN  Patient Details Name: Curtis Cobb MRN: [id number] DOB: [birth year] Today's Date: 10/18/2018  Rec Therapy Plan Is patient appropriate for Therapeutic Recreation?: Yes Treatment times per week: at least 3 Estimated Length of Stay: 5-7 days TR Treatment/Interventions: Group participation (Comment)  Discharge Criteria Pt will be discharged from therapy if:: Discharged Treatment plan/goals/alternatives discussed and agreed upon by:: Patient/family  Discharge Summary Short term goals set: Patient will identify 3 triggers to anxiety within 5 recreation therapy group sessions Short term goals met: Not met Progress toward goals comments: Groups attended Which groups?: Goal setting Reason goals not met: Patient spent most of his time in his room Therapeutic equipment acquired: N/A Reason patient discharged from therapy: Discharge from hospital Pt/family agrees with progress & goals achieved: Yes Date patient discharged from therapy: 10/18/18   Lonia Roane 10/18/2018, 12:57 PM

## 2018-10-18 NOTE — Progress Notes (Signed)
Curtis Cobb was visible in the milieu until bed time. Mood improved with a brighter affect. Attended group with motivation. Had a snack then presented to the medication room, reporting that He is feeling "much better". Was educated about his new medication. Patient stated that he is taking the right medications  "I just need to keep up with it". Had no concerns. Encouragements provided. Currently in bed sleeping and safety monitored.

## 2018-10-18 NOTE — Plan of Care (Signed)
Mood improved. More involved in activities. Pleasant and cooperative

## 2018-10-18 NOTE — Progress Notes (Signed)
  [facility] Adult Case Management Discharge Plan :  Will you be returning to the same living situation after discharge:  Yes,  Home with girlfriend. At discharge, do you have transportation home?: Yes,  Bus ticket or taxi' Do you have the ability to pay for your medications: Yes,  [facility]  Release of information consent forms completed and in the chart;  Patient's signature needed at discharge.  Patient to Follow up at: Follow-up Information    [facility] Go on 10/24/2018.   Why:  Please follow up with Unk Pinto on Monday October 24, 2018 for peer support services. He will come and pick you up at 7am. His number is [phone]. Thank you. Contact information: [address] [place] [address] [phone]           Next level of care provider has access to [facility] Link:no  Safety Planning and Suicide Prevention discussed: Yes,  Completed with patient. Patient refused any family contact.  Have you used any form of tobacco in the last 30 days? (Cigarettes, Smokeless Tobacco, Cigars, and/or Pipes): No  Has patient been referred to the Quitline?: N/A patient is not a smoker  Patient has been referred for addiction treatment: Yes  Johny Shears, LCSW 10/18/2018, 9:25 AM

## 2018-10-18 NOTE — Progress Notes (Signed)
Received Curtis Cobb after breakfast, he was compliant with his medications. He denied all of the psychiatric symptoms and feels safe to be discharge home. The AVS was reviewed and questions answered. He received her prescriptions and a  7 day supply of  medications. His personal belongings were  returned. He was  discharged without incident via taxi.

## 2018-11-11 ENCOUNTER — Emergency Department: Payer: Self-pay

## 2018-11-11 ENCOUNTER — Other Ambulatory Visit: Payer: Self-pay

## 2018-11-11 ENCOUNTER — Emergency Department
Admission: EM | Admit: 2018-11-11 | Discharge: 2018-11-12 | Disposition: A | Discharge status: Home / Self Care | Payer: Self-pay | Attending: Emergency Medicine | Admitting: Emergency Medicine

## 2018-11-11 DIAGNOSIS — F1020 Alcohol dependence, uncomplicated: Secondary | ICD-10-CM | POA: Insufficient documentation

## 2018-11-11 DIAGNOSIS — F250 Schizoaffective disorder, bipolar type: Secondary | ICD-10-CM | POA: Insufficient documentation

## 2018-11-11 DIAGNOSIS — F32A Depression, unspecified: Secondary | ICD-10-CM

## 2018-11-11 DIAGNOSIS — I10 Essential (primary) hypertension: Secondary | ICD-10-CM | POA: Insufficient documentation

## 2018-11-11 DIAGNOSIS — F329 Major depressive disorder, single episode, unspecified: Secondary | ICD-10-CM | POA: Insufficient documentation

## 2018-11-11 DIAGNOSIS — F1220 Cannabis dependence, uncomplicated: Secondary | ICD-10-CM | POA: Insufficient documentation

## 2018-11-11 DIAGNOSIS — R42 Dizziness and giddiness: Secondary | ICD-10-CM | POA: Insufficient documentation

## 2018-11-11 DIAGNOSIS — E039 Hypothyroidism, unspecified: Secondary | ICD-10-CM | POA: Insufficient documentation

## 2018-11-11 DIAGNOSIS — F1110 Opioid abuse, uncomplicated: Secondary | ICD-10-CM | POA: Insufficient documentation

## 2018-11-11 DIAGNOSIS — F419 Anxiety disorder, unspecified: Secondary | ICD-10-CM | POA: Insufficient documentation

## 2018-11-11 DIAGNOSIS — F17200 Nicotine dependence, unspecified, uncomplicated: Secondary | ICD-10-CM | POA: Insufficient documentation

## 2018-11-11 DIAGNOSIS — F1910 Other psychoactive substance abuse, uncomplicated: Secondary | ICD-10-CM

## 2018-11-11 DIAGNOSIS — Z79899 Other long term (current) drug therapy: Secondary | ICD-10-CM | POA: Insufficient documentation

## 2018-11-11 LAB — COMPREHENSIVE METABOLIC PANEL
ALT: 11 U/L (ref 0–44)
ANION GAP: 12 (ref 5–15)
AST: 16 U/L (ref 15–41)
Albumin: 3.9 g/dL (ref 3.5–5.0)
Alkaline Phosphatase: 49 U/L (ref 38–126)
BILIRUBIN TOTAL: 0.7 mg/dL (ref 0.3–1.2)
BUN: 8 mg/dL (ref 6–20)
CHLORIDE: 107 mmol/L (ref 98–111)
CO2: 22 mmol/L (ref 22–32)
Calcium: 8.8 mg/dL — ABNORMAL LOW (ref 8.9–10.3)
Creatinine, Ser: 0.97 mg/dL (ref 0.61–1.24)
Glucose, Bld: 77 mg/dL (ref 70–99)
Potassium: 3.6 mmol/L (ref 3.5–5.1)
Sodium: 141 mmol/L (ref 135–145)
TOTAL PROTEIN: 7 g/dL (ref 6.5–8.1)

## 2018-11-11 LAB — CBC
HEMATOCRIT: 42.1 % (ref 39.0–52.0)
Hemoglobin: 14 g/dL (ref 13.0–17.0)
MCH: 29.5 pg (ref 26.0–34.0)
MCHC: 33.3 g/dL (ref 30.0–36.0)
MCV: 88.8 fL (ref 80.0–100.0)
NRBC: 0 % (ref 0.0–0.2)
PLATELETS: 246 10*3/uL (ref 150–400)
RBC: 4.74 MIL/uL (ref 4.22–5.81)
RDW: 14.5 % (ref 11.5–15.5)
WBC: 9.5 10*3/uL (ref 4.0–10.5)

## 2018-11-11 LAB — CK: CK TOTAL: 49 U/L (ref 49–397)

## 2018-11-11 LAB — ETHANOL

## 2018-11-11 MED ORDER — SODIUM CHLORIDE 0.9 % IV SOLN
1000.0000 mL | Freq: Once | INTRAVENOUS | Status: AC
  Administered 2018-11-11: 1000 mL via INTRAVENOUS

## 2018-11-11 NOTE — ED Provider Notes (Signed)
[facility] Emergency Department Provider Note   ____________________________________________    I have reviewed the triage vital signs and the nursing notes.   HISTORY  Chief Complaint Dizziness     HPI Curtis Cobb is a 47 y.o. male presents with dizziness.  Patient notes over the last 3 days he has had intermittent dizziness which he attributed to medications.  He states that he has been stopping his medications to see if that would help and it does not seem to.  He states that he feels shaky and "jittery.  Was in the hospital recently.  Denies headache, no neuro deficits.   Past Medical History:  Diagnosis Date  . Alcohol use disorder, moderate, dependence (HCC) 02/13/2014  . Anxiety   . Depression   . Hypertension   . Rheumatoid arthritis (HCC)   . Substance abuse [facility])     Patient Active Problem List   Diagnosis Date Noted  . Hypothyroidism 10/17/2018  . Tobacco use disorder 10/09/2018  . Cellulitis 08/23/2018  . 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 MDD (major depressive disorder), recurrent episode, severe (HCC) 11/17/2015  . Opioid use disorder, mild, in sustained remission (HCC) 10/08/2015  . Alcohol use disorder, severe, dependence (HCC) 10/07/2015  . Cannabis use disorder, moderate, dependence (HCC) 10/07/2015  . HTN (hypertension) 10/07/2015  . Rheumatoid arthritis (HCC) 10/07/2015  . Schizoaffective disorder, depressive type [facility])     Past Surgical History:  Procedure Laterality Date  . INCISION AND DRAINAGE ABSCESS Right 08/25/2018   Procedure: INCISION AND DRAINAGE ABSCESS;  Surgeon: Christena Flake, MD;  Location: [facility] ORS;  Service: Orthopedics;  Laterality: Right;  . INCISION AND DRAINAGE ABSCESS Right 08/30/2018   Procedure: INCISION AND DRAINAGE ABSCESS;  Surgeon: Christena Flake, MD;  Location: [facility] ORS;  Service:  Orthopedics;  Laterality: Right;  . none      Prior to Admission medications   Medication Sig Start Date End Date Taking? Authorizing Provider  amitriptyline (ELAVIL) 100 MG tablet Take 1 tablet (100 mg total) by mouth at bedtime. 10/17/18   Pucilowska, Braulio Conte B, MD  divalproex (DEPAKOTE) 500 MG DR tablet Take 1 tablet (500 mg total) by mouth every 12 (twelve) hours. 10/17/18   Pucilowska, Braulio Conte B, MD  FLUoxetine (PROZAC) 40 MG capsule Take 1 capsule (40 mg total) by mouth daily. 10/17/18   Pucilowska, Braulio Conte B, MD  levothyroxine (SYNTHROID, LEVOTHROID) 25 MCG tablet Take 1 tablet (25 mcg total) by mouth daily before breakfast. 10/18/18   Pucilowska, Jolanta B, MD  prazosin (MINIPRESS) 1 MG capsule Take 1 capsule (1 mg total) by mouth 2 (two) times daily. 10/17/18   Pucilowska, Braulio Conte B, MD  QUEtiapine (SEROQUEL) 200 MG tablet Take 1 tablet (200 mg total) by mouth at bedtime. 10/17/18   Pucilowska, Ellin Goodie, MD     Allergies Sulfa antibiotics  Family History  Problem Relation Age of Onset  . Heart attack Mother   . Stroke Mother   . Hypertension Mother   . Mental illness Mother        Manic Depressive Bipolar Disorder  . Heart disease Mother   . Hypertension Maternal Aunt   . Mental illness Maternal Aunt   . Hypertension Maternal Aunt   . Mental illness Maternal Aunt     Social History Social History   Tobacco Use  . Smoking status: Current Every Day Smoker    Packs/day: 0.50  Years: 0.00    Pack years: 0.00  . Smokeless tobacco: Never Used  Substance Use Topics  . Alcohol use: Yes    Comment: drank last pm   . Drug use: No    Types: Amphetamines, Marijuana, Cocaine, Heroin    Comment: one month ago?     Review of Systems  Constitutional: No fever/chills Eyes: No visual changes.  ENT: No neck pain Cardiovascular: Denies chest pain. Respiratory: Denies shortness of breath. Gastrointestinal: No abdominal pain.  Genitourinary: Negative for  dysuria. Musculoskeletal: Negative for back pain. Skin: Negative for rash. Neurological: Negative for headaches    ____________________________________________   PHYSICAL EXAM:  VITAL SIGNS: ED Triage Vitals  Enc Vitals Group     BP 11/11/18 2002 120/86     Pulse Rate 11/11/18 2002 (!) 108     Resp 11/11/18 2002 18     Temp 11/11/18 2002 98 F (36.7 C)     Temp Source 11/11/18 2002 Oral     SpO2 11/11/18 2002 100 %     Weight 11/11/18 2001 81.6 kg (180 lb)     Height 11/11/18 2001 1.88 m (6\' 2" )     Head Circumference --      Peak Flow --      Pain Score 11/11/18 2000 7     Pain Loc --      Pain Edu? --      Excl. in GC? --     Constitutional: Alert and oriented. No acute distress.  Eyes: Conjunctivae are normal.  Nose: No congestion/rhinnorhea. Mouth/Throat: Mucous membranes are moist.    Cardiovascular: Normal rate, regular rhythm. Grossly normal heart sounds.  Good peripheral circulation. Respiratory: Normal respiratory effort.  No retractions.  Gastrointestinal: Soft and nontender. No distention  Musculoskeletal: No lower extremity tenderness nor edema.  Warm and well perfused Neurologic:  Normal speech and language. No gross focal neurologic deficits are appreciated.  Cranial nerves II through XII are normal , motor strength in all extremity's skin:  Skin is warm, dry and intact. No rash noted. Psychiatric: Mood and affect are normal. Speech and behavior are normal.  ____________________________________________   LABS (all labs ordered are listed, but only abnormal results are displayed)  Labs Reviewed  COMPREHENSIVE METABOLIC PANEL - Abnormal; Notable for the following components:      Result Value   Calcium 8.8 (*)    All other components within normal limits  CBC  CK  ETHANOL  URINE DRUG SCREEN, QUALITATIVE ([facility] ONLY)   ____________________________________________  EKG  ED ECG REPORT I, Jene Every, the attending physician, personally viewed  and interpreted this ECG.  Date: 11/11/2018  Rhythm: normal sinus rhythm QRS Axis: normal Intervals: normal ST/T Wave abnormalities: normal Narrative Interpretation: no evidence of acute ischemia  ____________________________________________  RADIOLOGY  CT head normal ____________________________________________   PROCEDURES  Procedure(s) performed: No  Procedures   Critical Care performed: No ____________________________________________   INITIAL IMPRESSION / ASSESSMENT AND PLAN / ED COURSE  Pertinent labs & imaging results that were available during my care of the patient were reviewed by me and considered in my medical decision making (see chart for details).  Patient well-appearing in no acute distress.  Exam is overall unremarkable.  Vital signs normal.  Lab work is benign.  Will give IV fluids, sent for CT  CT unremarkable.  Patient reports he is feeling better.  Now he states that he would like to speak with psychiatry for medication management.  Will consult tele-psych.  ____________________________________________   FINAL CLINICAL IMPRESSION(S) / ED DIAGNOSES  Final diagnoses:  Dizziness        Note:  This document was prepared using Dragon voice recognition software and may include unintentional dictation errors.    Jene Every, MD 11/11/18 [phone]

## 2018-11-11 NOTE — ED Triage Notes (Signed)
Pt arrives to ED via POV from home with c/o dizziness x3 days. Pt denies CP, abdominal pain, or SHOB. Pt states, "I also haven't taken my psych meds in three days", but unable to explain why. Pt appears anxious and frigidity. Pt denies any drug or ETOH use PTA.

## 2018-11-12 LAB — URINE DRUG SCREEN, QUALITATIVE (ARMC ONLY)
Amphetamines, Ur Screen: NOT DETECTED
Barbiturates, Ur Screen: NOT DETECTED
Benzodiazepine, Ur Scrn: POSITIVE — AB
COCAINE METABOLITE, UR ~~LOC~~: NOT DETECTED
Cannabinoid 50 Ng, Ur ~~LOC~~: NOT DETECTED
MDMA (Ecstasy)Ur Screen: NOT DETECTED
METHADONE SCREEN, URINE: NOT DETECTED
Opiate, Ur Screen: NOT DETECTED
PHENCYCLIDINE (PCP) UR S: NOT DETECTED
TRICYCLIC, UR SCREEN: POSITIVE — AB

## 2018-11-12 MED ORDER — LORAZEPAM 1 MG PO TABS
1.0000 mg | ORAL_TABLET | Freq: Once | ORAL | Status: AC
  Administered 2018-11-12: 1 mg via ORAL
  Filled 2018-11-12: qty 1

## 2018-11-12 MED ORDER — DIVALPROEX SODIUM ER 250 MG PO TB24
500.0000 mg | ORAL_TABLET | Freq: Once | ORAL | Status: AC
  Administered 2018-11-12: 500 mg via ORAL
  Filled 2018-11-12: qty 2

## 2018-11-12 NOTE — Discharge Instructions (Signed)
Please seek medical attention and help for any thoughts about wanting to harm yourself, harm others, any concerning change in behavior, severe depression, inappropriate drug use or any other new or concerning symptoms. ° °

## 2018-11-12 NOTE — ED Notes (Signed)
Pt. Had personal clothing and merchandise returned.  Pt. Currently getting changed into personal clothing.

## 2018-11-12 NOTE — ED Notes (Addendum)
Pt up ad lib to bathroom. Walks with steady gait.

## 2018-11-12 NOTE — ED Notes (Signed)
Pt. Discharged from ED with all belongings.

## 2018-11-12 NOTE — ED Notes (Signed)
Pending SOC 

## 2018-11-12 NOTE — ED Notes (Signed)
Patient is awake and He states that he is feeling anxious and depressed, will talk to doctor about antianxiety medication.

## 2018-11-12 NOTE — ED Notes (Signed)
Pt. Completed SOC. 

## 2018-11-12 NOTE — ED Notes (Signed)
Pt's belongings consist of : 1 black tee shirt, 1 grey jacket, a pair of brown kaki pants, 1 light brown belt., 1 black wallet, with no money in it, 1 set of keys, 1 pair of grey shoes and black socks, 1 large black coat, 1 pair of red underware, 1 pair of white underwear and 1 backpack. Items were reviewed by this EDT and RN, Richardson Dopp. A.

## 2018-11-12 NOTE — ED Notes (Signed)
Pt given dinner tray.

## 2018-11-12 NOTE — ED Provider Notes (Signed)
Repeat SOC has evaluated the patient. Recommend discharge at this time. Did discuss naltrexone with the patient but he states that he is not interested in it.    Phineas Semen, MD 11/12/18 2111

## 2018-11-12 NOTE — ED Notes (Signed)
Patient states that the ativan has helped him and He feels better at this time.

## 2018-11-12 NOTE — ED Notes (Signed)
Pt. Refused to give final vitals.

## 2018-11-12 NOTE — ED Provider Notes (Signed)
-----------------------------------------   4:19 AM on 11/12/2018 -----------------------------------------  Assumed care of patient from Dr. Manson Passey.  Patient has had SOC evaluation by Dr. Marinda Elk who discussed case with Dr. Manson Passey.  Recommends reevaluation in the morning.  Recommends Depakote ER 500 mg now.   Irean Hong, MD 11/12/18 [phone]

## 2018-11-12 NOTE — ED Notes (Signed)
Patient speaking to psychiatrist at this time via telepsych.

## 2018-11-13 ENCOUNTER — Encounter: Payer: Self-pay | Admitting: Emergency Medicine

## 2018-11-13 ENCOUNTER — Emergency Department
Admission: EM | Admit: 2018-11-13 | Discharge: 2018-11-13 | Disposition: A | Discharge status: Home / Self Care | Payer: Self-pay | Attending: Emergency Medicine | Admitting: Emergency Medicine

## 2018-11-13 DIAGNOSIS — R45851 Suicidal ideations: Secondary | ICD-10-CM | POA: Insufficient documentation

## 2018-11-13 DIAGNOSIS — F10920 Alcohol use, unspecified with intoxication, uncomplicated: Secondary | ICD-10-CM | POA: Insufficient documentation

## 2018-11-13 DIAGNOSIS — F10229 Alcohol dependence with intoxication, unspecified: Secondary | ICD-10-CM | POA: Insufficient documentation

## 2018-11-13 DIAGNOSIS — F10929 Alcohol use, unspecified with intoxication, unspecified: Secondary | ICD-10-CM

## 2018-11-13 DIAGNOSIS — F250 Schizoaffective disorder, bipolar type: Secondary | ICD-10-CM | POA: Insufficient documentation

## 2018-11-13 DIAGNOSIS — Z765 Malingerer [conscious simulation]: Secondary | ICD-10-CM | POA: Insufficient documentation

## 2018-11-13 DIAGNOSIS — I10 Essential (primary) hypertension: Secondary | ICD-10-CM | POA: Insufficient documentation

## 2018-11-13 DIAGNOSIS — F17210 Nicotine dependence, cigarettes, uncomplicated: Secondary | ICD-10-CM | POA: Insufficient documentation

## 2018-11-13 DIAGNOSIS — E039 Hypothyroidism, unspecified: Secondary | ICD-10-CM | POA: Insufficient documentation

## 2018-11-13 DIAGNOSIS — F1520 Other stimulant dependence, uncomplicated: Secondary | ICD-10-CM | POA: Insufficient documentation

## 2018-11-13 DIAGNOSIS — Z79899 Other long term (current) drug therapy: Secondary | ICD-10-CM | POA: Insufficient documentation

## 2018-11-13 LAB — COMPREHENSIVE METABOLIC PANEL
ALT: 12 U/L (ref 0–44)
AST: 18 U/L (ref 15–41)
Albumin: 4.3 g/dL (ref 3.5–5.0)
Alkaline Phosphatase: 57 U/L (ref 38–126)
Anion gap: 10 (ref 5–15)
BUN: 9 mg/dL (ref 6–20)
CHLORIDE: 106 mmol/L (ref 98–111)
CO2: 24 mmol/L (ref 22–32)
CREATININE: 0.92 mg/dL (ref 0.61–1.24)
Calcium: 8.8 mg/dL — ABNORMAL LOW (ref 8.9–10.3)
GFR calc non Af Amer: >60 mL/min (ref >60)
Glucose, Bld: 88 mg/dL (ref 70–99)
POTASSIUM: 3.9 mmol/L (ref 3.5–5.1)
SODIUM: 140 mmol/L (ref 135–145)
Total Bilirubin: 0.5 mg/dL (ref 0.3–1.2)
Total Protein: 7.7 g/dL (ref 6.5–8.1)

## 2018-11-13 LAB — CBC
HEMATOCRIT: 45.6 % (ref 39.0–52.0)
HEMOGLOBIN: 15.2 g/dL (ref 13.0–17.0)
MCH: 29.5 pg (ref 26.0–34.0)
MCHC: 33.3 g/dL (ref 30.0–36.0)
MCV: 88.4 fL (ref 80.0–100.0)
Platelets: 272 10*3/uL (ref 150–400)
RBC: 5.16 MIL/uL (ref 4.22–5.81)
RDW: 14.2 % (ref 11.5–15.5)
WBC: 11.1 10*3/uL — AB (ref 4.0–10.5)
nRBC: 0 % (ref 0.0–0.2)

## 2018-11-13 LAB — ETHANOL: ALCOHOL ETHYL (B): 178 mg/dL — AB (ref <10)

## 2018-11-13 LAB — ACETAMINOPHEN LEVEL

## 2018-11-13 LAB — SALICYLATE LEVEL

## 2018-11-13 MED ORDER — NALTREXONE HCL 50 MG PO TABS: 50.0000 mg | ORAL_TABLET | Freq: Every day | ORAL | 0 refills | Status: DC

## 2018-11-13 MED ORDER — HALOPERIDOL LACTATE 5 MG/ML IJ SOLN
5.0000 mg | Freq: Once | INTRAMUSCULAR | Status: DC
  Filled 2018-11-13: qty 1

## 2018-11-13 MED ORDER — DIPHENHYDRAMINE HCL 50 MG/ML IJ SOLN
50.0000 mg | Freq: Once | INTRAMUSCULAR | Status: DC
  Filled 2018-11-13: qty 1

## 2018-11-13 MED ORDER — LORAZEPAM 2 MG/ML IJ SOLN
2.0000 mg | Freq: Once | INTRAMUSCULAR | Status: DC
  Filled 2018-11-13: qty 1

## 2018-11-13 NOTE — ED Notes (Signed)
Patient is sitting in the interview room, waiting for the [facility] doctor to speak with him

## 2018-11-13 NOTE — ED Notes (Signed)
Patient is talking to Dr. Fermin Schwab the [facility] doctor

## 2018-11-13 NOTE — ED Notes (Signed)
Pt. Asked "Why have you come back to the emergency dept.".  Pt. Stated "So you could save my life".  Pt. Asked "Are you feeling Suicidal".  Pt. Responded "Yes".  Pt. Will not identify why he is feeling suicidal.  Pt. Was just discharged from ED about 3 hours ago.  Pt. Had two SOC completed, where [facility] doctor recommended discharge.

## 2018-11-13 NOTE — ED Notes (Signed)
TTS attempted to talk to patient.  Patient unable to communicate to TTS in a reasonable manner.  TTS will attempt to talk to patient again in morning when sober.

## 2018-11-13 NOTE — ED Notes (Signed)
Patient discharged home. Patient received discharge papers and prescription for Depade. Patient received belongings and verbalized he has received all of his belongings. Patient appropriate and cooperative, Denies SI/HI AVH. Vital signs taken. NAD noted.  Patient received a transportation voucher [employer] taxi service

## 2018-11-13 NOTE — Discharge Instructions (Signed)
Return to the emergency department immediately for any worsening condition including no worsening depression, or any thoughts of wanting to hurt yourself or others, or any other symptoms concerning to you.

## 2018-11-13 NOTE — ED Triage Notes (Signed)
Pt currently intoxicated and incoherent. Pt awakened to voice and was agreeable to complete assessment. Pt was difficult to understand much of the time and timelines were inconsistent. Pt speech is delayed and he is unable to provide clear history. He was not awakened by voice and actively resists arousal when physically prompted. Per self report upon admission pt had been drinking rubbing alcohol. When asked pt states " isobutyl, enough to fill a cup". This is the only information this writer could ascertain from the pt. Pt recently discharged less that 24 hour ago with similar presentation.

## 2018-11-13 NOTE — ED Notes (Signed)
Patient just completed his consult with [facility] and is at the nurses station asking nurse "whats going to happen, he (doctor) says he is letting me leave and all im doing is asking for help, is there another doctor I can talk to, that doctor has it out for me this is the second time I talked to him and he wants to let me go, he has something against me and im just asking for help". Informed patient that if he does not meet criteria for inpatient we cant allow him to stay. Patient laid down on his bed and began eating breakfast.

## 2018-11-13 NOTE — ED Triage Notes (Signed)
Patient states that he is here because he is suicidal and homicidal. Patient states that he has been drinking rubbing alcohol and Listerine today.

## 2018-11-13 NOTE — ED Provider Notes (Signed)
[facility] Emergency Department Provider Note  ____________________________________________   First MD Initiated Contact with Patient 11/13/18 0207     (approximate)  I have reviewed the triage vital signs and the nursing notes.   HISTORY  Chief Complaint Psychiatric Evaluation  Level 5 exemption history is limited by the patient's intoxication  HPI Curtis Cobb is a 47 y.o. male who comes to the emergency department  requesting "help".  He was seen in our emergency department yesterday and actually discharged about 4-1/2 hours ago after he had a 24-hour stay and was seen and evaluated by psychiatry.  On discharge the patient said he felt better however after leaving he reportedly purchased a bottle of Listerine and perhaps a bottle of rubbing alcohol and drank them both.  He says that he does not know where to go and wants to know "What's the plan, man?"  He says he feels like he was released too soon and he would like to speak to a psychiatrist because he wants to hurt himself.  History is challenging to obtain as the patient is clearly intoxicated and not forthcoming with what exactly is happening.   Past Medical History:  Diagnosis Date  . Alcohol use disorder, moderate, dependence (HCC) 02/13/2014  . Anxiety   . Depression   . Hypertension   . Rheumatoid arthritis (HCC)   . Substance abuse [facility])     Patient Active Problem List   Diagnosis Date Noted  . Hypothyroidism 10/17/2018  . Tobacco use disorder 10/09/2018  . Cellulitis 08/23/2018  . 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 MDD (major depressive disorder), recurrent episode, severe (HCC) 11/17/2015  . Opioid use disorder, mild, in sustained remission (HCC) 10/08/2015  . Alcohol use disorder, severe, dependence (HCC) 10/07/2015  . Cannabis use disorder, moderate, dependence (HCC) 10/07/2015  .  HTN (hypertension) 10/07/2015  . Rheumatoid arthritis (HCC) 10/07/2015  . Schizoaffective disorder, depressive type [facility])     Past Surgical History:  Procedure Laterality Date  . INCISION AND DRAINAGE ABSCESS Right 08/25/2018   Procedure: INCISION AND DRAINAGE ABSCESS;  Surgeon: Christena Flake, MD;  Location: [facility] ORS;  Service: Orthopedics;  Laterality: Right;  . INCISION AND DRAINAGE ABSCESS Right 08/30/2018   Procedure: INCISION AND DRAINAGE ABSCESS;  Surgeon: Christena Flake, MD;  Location: [facility] ORS;  Service: Orthopedics;  Laterality: Right;  . none      Prior to Admission medications   Medication Sig Start Date End Date Taking? Authorizing Provider  amitriptyline (ELAVIL) 100 MG tablet Take 1 tablet (100 mg total) by mouth at bedtime. 10/17/18  Yes Pucilowska, Jolanta B, MD  divalproex (DEPAKOTE) 500 MG DR tablet Take 1 tablet (500 mg total) by mouth every 12 (twelve) hours. 10/17/18  Yes Pucilowska, Jolanta B, MD  FLUoxetine (PROZAC) 40 MG capsule Take 1 capsule (40 mg total) by mouth daily. 10/17/18  Yes Pucilowska, Jolanta B, MD  levothyroxine (SYNTHROID, LEVOTHROID) 25 MCG tablet Take 1 tablet (25 mcg total) by mouth daily before breakfast. 10/18/18  Yes Pucilowska, Jolanta B, MD  QUEtiapine (SEROQUEL) 200 MG tablet Take 1 tablet (200 mg total) by mouth at bedtime. Patient taking differently: Take 200 mg by mouth at bedtime as needed (anxiety).  10/17/18  Yes Pucilowska, Jolanta B, MD  prazosin (MINIPRESS) 1 MG capsule Take 1 capsule (1 mg total) by mouth 2 (two) times daily. Patient not taking: Reported on 11/12/2018 10/17/18  Pucilowska, Jolanta B, MD    Allergies Sulfa antibiotics  Family History  Problem Relation Age of Onset  . Heart attack Mother   . Stroke Mother   . Hypertension Mother   . Mental illness Mother        Manic Depressive Bipolar Disorder  . Heart disease Mother   . Hypertension Maternal Aunt   . Mental illness Maternal Aunt   . Hypertension Maternal  Aunt   . Mental illness Maternal Aunt     Social History Social History   Tobacco Use  . Smoking status: Current Every Day Smoker    Packs/day: 0.50    Years: 0.00    Pack years: 0.00  . Smokeless tobacco: Never Used  Substance Use Topics  . Alcohol use: Yes    Comment: drank last pm   . Drug use: No    Types: Amphetamines, Marijuana, Cocaine, Heroin    Comment: one month ago?     Review of Systems Level 5 exemption history limited by the patient's intoxication  ____________________________________________   PHYSICAL EXAM:  VITAL SIGNS: ED Triage Vitals [11/13/18 0153]  Enc Vitals Group     BP 117/69     Pulse Rate (!) 107     Resp 18     Temp (!) 97.4 F (36.3 C)     Temp Source Oral     SpO2 97 %     Weight 179 lb 14.3 oz (81.6 kg)     Height 6\' 2"  (1.88 m)     Head Circumference      Peak Flow      Pain Score 0     Pain Loc      Pain Edu?      Excl. in GC?     Constitutional: The patient has bizarre behavior with slurred speech and alcohol on his breath.  He is alert and oriented x4 although is clearly not appropriate Eyes: PERRL EOMI. nipples are 8 mm bilaterally and brisk Head: Atraumatic. Nose: No congestion/rhinnorhea. Mouth/Throat: No trismus.  Very dry mouth Neck: No stridor.   Cardiovascular: Tachycardic rate, regular rhythm. Grossly normal heart sounds.  Good peripheral circulation. Respiratory: Normal respiratory effort.  No retractions. Lungs CTAB and moving good air Gastrointestinal: Soft nontender Musculoskeletal: No lower extremity edema   Neurologic:  . No gross focal neurologic deficits are appreciated. Skin:  Skin is warm, dry and intact. No rash noted. Psychiatric: Bizarre and somewhat erratic behavior    ____________________________________________   DIFFERENTIAL includes but not limited to  Suicidal ideation, drug overdose, alcohol intoxication, isopropyl alcohol intoxication,  malingering ____________________________________________   LABS (all labs ordered are listed, but only abnormal results are displayed)  Labs Reviewed  COMPREHENSIVE METABOLIC PANEL - Abnormal; Notable for the following components:      Result Value   Calcium 8.8 (*)    All other components within normal limits  ETHANOL - Abnormal; Notable for the following components:   Alcohol, Ethyl (B) 178 (*)    All other components within normal limits  CBC - Abnormal; Notable for the following components:   WBC 11.1 (*)    All other components within normal limits  ACETAMINOPHEN LEVEL - Abnormal; Notable for the following components:   Acetaminophen (Tylenol), Serum <10 (*)    All other components within normal limits  SALICYLATE LEVEL  URINE DRUG SCREEN, QUALITATIVE ([facility] ONLY)    Lab work reviewed by me with elevated ethanol level consistent with acute intoxication __________________________________________  EKG  ____________________________________________  RADIOLOGY   ____________________________________________   PROCEDURES  Procedure(s) performed: no  Procedures  Critical Care performed: no  ____________________________________________   INITIAL IMPRESSION / ASSESSMENT AND PLAN / ED COURSE  Pertinent labs & imaging results that were available during my care of the patient were reviewed by me and considered in my medical decision making (see chart for details).   As part of my medical decision making, I reviewed the following data within the electronic MEDICAL RECORD NUMBER History obtained from family if available, nursing notes, old chart and ekg, as well as notes from prior ED visits.  The patient agrees to stay in the emergency department and I do not believe he requires involuntary commitment.  Once I gave him a glass of water he was more calm and cooperative.  He is clearly acutely intoxicated and I cannot exactly say what he is taken.  Isopropyl alcohol is actually  not dangerous an acute ingestion aside from somnolence and he is protecting his airway.  At this point I will let him sleep the night and have him speak to the specialist on-call for reevaluation.     The patient's lab work is back showing a significantly elevated ethanol level however at this point he is medically stable for psychiatric evaluation. ____________________________________________   FINAL CLINICAL IMPRESSION(S) / ED DIAGNOSES  Final diagnoses:  Alcoholic intoxication with complication (HCC)  Suicidal ideation      NEW MEDICATIONS STARTED DURING THIS VISIT:  New Prescriptions   No medications on file     Note:  This document was prepared using Dragon voice recognition software and may include unintentional dictation errors.     Merrily Brittle, MD 11/13/18 [phone]

## 2018-11-13 NOTE — ED Provider Notes (Signed)
[facility]  I accepted care from Dr. Lamont Snowball ____________________________________________    LABS (pertinent positives/negatives)  I, Governor Rooks, MD have personally reviewed the lab reports noted below.  Labs Reviewed  COMPREHENSIVE METABOLIC PANEL - Abnormal; Notable for the following components:      Result Value   Calcium 8.8 (*)    All other components within normal limits  ETHANOL - Abnormal; Notable for the following components:   Alcohol, Ethyl (B) 178 (*)    All other components within normal limits  CBC - Abnormal; Notable for the following components:   WBC 11.1 (*)    All other components within normal limits  ACETAMINOPHEN LEVEL - Abnormal; Notable for the following components:   Acetaminophen (Tylenol), Serum <10 (*)    All other components within normal limits  SALICYLATE LEVEL  URINE DRUG SCREEN, QUALITATIVE ([facility] ONLY)     ____________________________________________    RADIOLOGY All xrays were viewed by me. Imaging interpreted by radiologist.  I, Governor Rooks MD have personally reviewed the imaging report noted below.  None  ____________________________________________   PROCEDURES  Procedure(s) performed: None  Procedures  Critical Care performed: None  ____________________________________________   INITIAL IMPRESSION / ASSESSMENT AND PLAN / ED COURSE   Pertinent labs & imaging results that were available during my care of the patient were reviewed by me and considered in my medical decision making (see chart for details).   I excepted care this morning.  Patient here for alcohol intoxication, multiple previous visits.  Awaiting specialist on-call psychiatry evaluation.  I reviewed the specialist on-call psychiatry evaluation.  Patient does not meet any criteria for hospital psychiatric management.  Suspicion for malingering, as well as alcohol use and methamphetamine use/polysubstance abuse.  Recommended for  discharge.  Psychiatry recommendation to offer naltrexone 50 mg daily to help prevent heavy drinking, I did go ahead and write for this prescription for 2 weeks.  He has follow-up on November 26 apparently with his psychiatrist.      CONSULTATIONS: Specialist call psychiatrist    Patient / Family / Caregiver informed of clinical course, medical decision-making process, and agree with plan.   I discussed return precautions, follow-up instructions, and discharged instructions with patient and/or family.   Discharge instructions:   Return to the emergency department immediately for any worsening condition including no worsening depression, or any thoughts of wanting to hurt yourself or others, or any other symptoms concerning to you.     ____________________________________________   FINAL CLINICAL IMPRESSION(S) / ED DIAGNOSES  Final diagnoses:  Alcoholic intoxication with complication (HCC)  Suicidal ideation        Governor Rooks, MD 11/13/18 1004

## 2019-01-28 ENCOUNTER — Emergency Department
Admission: EM | Admit: 2019-01-28 | Discharge: 2019-01-29 | Disposition: A | Discharge status: Home / Self Care | Payer: Self-pay | Attending: Emergency Medicine | Admitting: Emergency Medicine

## 2019-01-28 DIAGNOSIS — F32A Depression, unspecified: Secondary | ICD-10-CM

## 2019-01-28 DIAGNOSIS — E039 Hypothyroidism, unspecified: Secondary | ICD-10-CM | POA: Insufficient documentation

## 2019-01-28 DIAGNOSIS — F17200 Nicotine dependence, unspecified, uncomplicated: Secondary | ICD-10-CM | POA: Insufficient documentation

## 2019-01-28 DIAGNOSIS — F329 Major depressive disorder, single episode, unspecified: Secondary | ICD-10-CM | POA: Insufficient documentation

## 2019-01-28 DIAGNOSIS — F10921 Alcohol use, unspecified with intoxication delirium: Secondary | ICD-10-CM

## 2019-01-28 DIAGNOSIS — I10 Essential (primary) hypertension: Secondary | ICD-10-CM | POA: Insufficient documentation

## 2019-01-28 LAB — COMPREHENSIVE METABOLIC PANEL
ALT: 45 U/L — ABNORMAL HIGH (ref 0–44)
AST: 69 U/L — ABNORMAL HIGH (ref 15–41)
Albumin: 4.2 g/dL (ref 3.5–5.0)
Alkaline Phosphatase: 62 U/L (ref 38–126)
Anion gap: 16 — ABNORMAL HIGH (ref 5–15)
BUN: 9 mg/dL (ref 6–20)
CALCIUM: 8.6 mg/dL — AB (ref 8.9–10.3)
CO2: 19 mmol/L — AB (ref 22–32)
CREATININE: 1.06 mg/dL (ref 0.61–1.24)
Chloride: 103 mmol/L (ref 98–111)
Glucose, Bld: 83 mg/dL (ref 70–99)
Potassium: 4 mmol/L (ref 3.5–5.1)
SODIUM: 138 mmol/L (ref 135–145)
TOTAL PROTEIN: 8 g/dL (ref 6.5–8.1)
Total Bilirubin: 0.6 mg/dL (ref 0.3–1.2)

## 2019-01-28 LAB — ETHANOL: Alcohol, Ethyl (B): 305 mg/dL (ref <10)

## 2019-01-28 LAB — CBC WITH DIFFERENTIAL/PLATELET
Abs Immature Granulocytes: 0.04 10*3/uL (ref 0.00–0.07)
Basophils Absolute: 0 10*3/uL (ref 0.0–0.1)
Basophils Relative: 1 %
EOS ABS: 0.2 10*3/uL (ref 0.0–0.5)
Eosinophils Relative: 4 %
HEMATOCRIT: 43.3 % (ref 39.0–52.0)
Hemoglobin: 14.6 g/dL (ref 13.0–17.0)
IMMATURE GRANULOCYTES: 1 %
Lymphocytes Relative: 32 %
Lymphs Abs: 2.1 10*3/uL (ref 0.7–4.0)
MCH: 30.1 pg (ref 26.0–34.0)
MCHC: 33.7 g/dL (ref 30.0–36.0)
MCV: 89.3 fL (ref 80.0–100.0)
MONO ABS: 0.8 10*3/uL (ref 0.1–1.0)
MONOS PCT: 12 %
NEUTROS PCT: 50 %
Neutro Abs: 3.4 10*3/uL (ref 1.7–7.7)
Platelets: 447 10*3/uL — ABNORMAL HIGH (ref 150–400)
RBC: 4.85 MIL/uL (ref 4.22–5.81)
RDW: 13.4 % (ref 11.5–15.5)
WBC: 6.6 10*3/uL (ref 4.0–10.5)
nRBC: 0 % (ref 0.0–0.2)

## 2019-01-28 LAB — SALICYLATE LEVEL

## 2019-01-28 LAB — ACETAMINOPHEN LEVEL: Acetaminophen (Tylenol), Serum: <10 ug/mL — ABNORMAL LOW (ref 10–30)

## 2019-01-28 MED ORDER — LORAZEPAM 2 MG/ML IJ SOLN
2.0000 mg | Freq: Once | INTRAMUSCULAR | Status: AC
  Administered 2019-01-28: 2 mg via INTRAVENOUS
  Filled 2019-01-28: qty 1

## 2019-01-28 NOTE — ED Notes (Signed)
Pt is now able to calm down and answer questions after being medicated with ativan. Pt does not remember the episode. Still in forensic handcuffs.
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
## 2019-01-29 MED ORDER — FLUOXETINE HCL 20 MG PO CAPS
20.0000 mg | ORAL_CAPSULE | Freq: Every day | ORAL | Status: DC
  Administered 2019-01-29: 20 mg via ORAL
  Filled 2019-01-29: qty 1

## 2019-01-29 MED ORDER — FLUOXETINE HCL 20 MG PO CAPS: 20.0000 mg | ORAL_CAPSULE | Freq: Every day | ORAL | 0 refills | Status: DC

## 2019-01-29 MED ORDER — LORAZEPAM 1 MG PO TABS
1.0000 mg | ORAL_TABLET | Freq: Once | ORAL | Status: AC
  Administered 2019-01-29: 1 mg via ORAL
  Filled 2019-01-29: qty 1

## 2019-01-29 NOTE — ED Notes (Signed)
Pt sleeping soundly; vital sounds stable; sitter at bedside for safety;

## 2019-01-29 NOTE — ED Notes (Signed)
Pt stirring some; said hello when opened door; sitter remains at bedside for safety;

## 2019-01-29 NOTE — ED Provider Notes (Signed)
-----------------------------------------   6:25 AM on 01/29/2019 -----------------------------------------  Patient now awake.  When queried regarding suicidal ideation, patient endorses SI without plan.  Will consult tele-psychiatry and TTS.   ----------------------------------------- 7:12 AM on 01/29/2019 -----------------------------------------  Care transferred to Dr. Pershing Proud.  Patient remains in the ED under IVC pending psychiatry evaluation and disposition.   Irean Hong, MD 01/29/19 [phone]

## 2019-01-29 NOTE — ED Notes (Signed)
Pt resting quietly in bed with eyes closed, resp even and unlabored; sitters have changed assignment and new sitter is at bedside;

## 2019-01-29 NOTE — ED Provider Notes (Addendum)
-----------------------------------------   12:14 PM on 01/29/2019 -----------------------------------------   Blood pressure 116/74, pulse 99, resp. rate (!) 22, height 6\' 2"  (1.88 m), weight 81.6 kg, SpO2 97 %.  The patient is calm and cooperative at this time.  There have been no acute events since the last update.  Patient has been evaluated by the psychiatrist, Dr. Loretha Brasil who reversed the patient's involuntary commitment and recommends discharge and resuming Prozac, 20 mg p.o. daily.  Patient without any complaints of suicidal or homicidal ideation at this time.  Not clinically intoxicated.  Will be discharged at this time.  Patient understanding of the diagnosis as well as treatment plan.  Slightly elevated liver enzymes.  Likely related to drinking.   Myrna Blazer, MD 01/29/19 1215    Myrna Blazer, MD 01/29/19 [phone]

## 2019-01-29 NOTE — ED Notes (Signed)
BEHAVIORAL HEALTH ROUNDING Patient sleeping: No. Patient alert and oriented: yes Behavior appropriate: Yes.  ; If no, describe:  Nutrition and fluids offered: yes Toileting and hygiene offered: Yes  Sitter present: q15 minute observations and security  monitoring Law enforcement present: Yes  ODS  

## 2019-01-29 NOTE — ED Notes (Signed)

## 2019-01-29 NOTE — ED Notes (Signed)
Patient observed lying in bed with eyes closed  Even, unlabored respirations observed   NAD pt appears to be sleeping  I will continue to monitor along with every 15 minute visual observations and ongoing security monitoring    

## 2019-01-29 NOTE — ED Notes (Signed)
Pt continues to sleep with even unlabored respirations; sitter remains at bedside for safety

## 2019-01-29 NOTE — ED Notes (Signed)
ED Is the patient under IVC or is there intent for IVC: Yes.   Is the patient medically cleared: Yes.   Is there vacancy in the ED BHU: Yes.   Is the population mix appropriate for patient: Yes.   Is the patient awaiting placement in inpatient or outpatient setting:  Pending disposition paperwork from [facility]  Has the patient had a psychiatric consult: Yes.   Survey of unit performed for contraband, proper placement and condition of furniture, tampering with fixtures in bathroom, shower, and each patient room: Yes.  ; Findings:  APPEARANCE/BEHAVIOR Calm and cooperative NEURO ASSESSMENT Orientation: oriented x3  Denies pain Hallucinations: No.None noted (Hallucinations) denies  Speech: Normal Gait: normal RESPIRATORY ASSESSMENT Even  Unlabored respirations  CARDIOVASCULAR ASSESSMENT Pulses equal   regular rate  Skin warm and dry   GASTROINTESTINAL ASSESSMENT no GI complaint EXTREMITIES Full ROM  PLAN OF CARE Provide calm/safe environment. Vital signs assessed twice daily. ED BHU Assessment once each 12-hour shift. Collaborate with TTS daily or as condition indicates. Assure the ED provider has rounded once each shift. Provide and encourage hygiene. Provide redirection as needed. Assess for escalating behavior; address immediately and inform ED provider.  Assess family dynamic and appropriateness for visitation as needed: Yes.  ; If necessary, describe findings:  Educate the patient/family about BHU procedures/visitation: Yes.  ; If necessary, describe findings:

## 2019-01-29 NOTE — ED Notes (Signed)
2 sheriff deputies arrived to see if pt was ready for discharge as he has warrants for his arrest; in to speak with pt who is awake now and says he is having suicidal thoughts; pt changed into burgundy scrubs, wanded and moved to room 20; officer and rover present; IV to remain in place per beh quad RN in case pt needs IV meds for alcohol withdrawals;

## 2021-03-28 ENCOUNTER — Encounter: Payer: Self-pay | Admitting: *Deleted

## 2021-03-28 ENCOUNTER — Emergency Department
Admission: EM | Admit: 2021-03-28 | Discharge: 2021-04-07 | Disposition: A | Discharge status: Home / Self Care | Payer: Medicaid Other | Attending: Emergency Medicine | Admitting: Emergency Medicine

## 2021-03-28 ENCOUNTER — Other Ambulatory Visit: Payer: Self-pay

## 2021-03-28 ENCOUNTER — Emergency Department: Payer: Medicaid Other

## 2021-03-28 DIAGNOSIS — Z20822 Contact with and (suspected) exposure to covid-19: Secondary | ICD-10-CM | POA: Insufficient documentation

## 2021-03-28 DIAGNOSIS — E039 Hypothyroidism, unspecified: Secondary | ICD-10-CM | POA: Insufficient documentation

## 2021-03-28 DIAGNOSIS — I10 Essential (primary) hypertension: Secondary | ICD-10-CM | POA: Insufficient documentation

## 2021-03-28 DIAGNOSIS — F17200 Nicotine dependence, unspecified, uncomplicated: Secondary | ICD-10-CM | POA: Diagnosis present

## 2021-03-28 DIAGNOSIS — R45851 Suicidal ideations: Secondary | ICD-10-CM | POA: Insufficient documentation

## 2021-03-28 DIAGNOSIS — Z79899 Other long term (current) drug therapy: Secondary | ICD-10-CM | POA: Insufficient documentation

## 2021-03-28 DIAGNOSIS — F609 Personality disorder, unspecified: Secondary | ICD-10-CM | POA: Diagnosis present

## 2021-03-28 DIAGNOSIS — R519 Headache, unspecified: Secondary | ICD-10-CM

## 2021-03-28 DIAGNOSIS — F332 Major depressive disorder, recurrent severe without psychotic features: Secondary | ICD-10-CM | POA: Insufficient documentation

## 2021-03-28 DIAGNOSIS — F259 Schizoaffective disorder, unspecified: Secondary | ICD-10-CM | POA: Insufficient documentation

## 2021-03-28 DIAGNOSIS — F251 Schizoaffective disorder, depressive type: Secondary | ICD-10-CM | POA: Diagnosis present

## 2021-03-28 LAB — COMPREHENSIVE METABOLIC PANEL
ALT: 21 U/L (ref 0–44)
AST: 27 U/L (ref 15–41)
Albumin: 4.4 g/dL (ref 3.5–5.0)
Alkaline Phosphatase: 67 U/L (ref 38–126)
Anion gap: 13 (ref 5–15)
BUN: 12 mg/dL (ref 6–20)
CO2: 24 mmol/L (ref 22–32)
Calcium: 9.3 mg/dL (ref 8.9–10.3)
Chloride: 97 mmol/L — ABNORMAL LOW (ref 98–111)
Creatinine, Ser: 0.95 mg/dL (ref 0.61–1.24)
GFR, Estimated: >60 mL/min (ref >60)
Glucose, Bld: 120 mg/dL — ABNORMAL HIGH (ref 70–99)
Potassium: 4.2 mmol/L (ref 3.5–5.1)
Sodium: 134 mmol/L — ABNORMAL LOW (ref 135–145)
Total Bilirubin: 1.1 mg/dL (ref 0.3–1.2)
Total Protein: 8.4 g/dL — ABNORMAL HIGH (ref 6.5–8.1)

## 2021-03-28 LAB — URINE DRUG SCREEN, QUALITATIVE (ARMC ONLY)
Amphetamines, Ur Screen: NOT DETECTED
Barbiturates, Ur Screen: NOT DETECTED
Benzodiazepine, Ur Scrn: POSITIVE — AB
Cannabinoid 50 Ng, Ur ~~LOC~~: NOT DETECTED
Cocaine Metabolite,Ur ~~LOC~~: NOT DETECTED
MDMA (Ecstasy)Ur Screen: NOT DETECTED
Methadone Scn, Ur: NOT DETECTED
Opiate, Ur Screen: NOT DETECTED
Phencyclidine (PCP) Ur S: NOT DETECTED
Tricyclic, Ur Screen: NOT DETECTED

## 2021-03-28 LAB — CBC
HCT: 46 % (ref 39.0–52.0)
Hemoglobin: 15.4 g/dL (ref 13.0–17.0)
MCH: 29.6 pg (ref 26.0–34.0)
MCHC: 33.5 g/dL (ref 30.0–36.0)
MCV: 88.5 fL (ref 80.0–100.0)
Platelets: 272 10*3/uL (ref 150–400)
RBC: 5.2 MIL/uL (ref 4.22–5.81)
RDW: 13.6 % (ref 11.5–15.5)
WBC: 9.9 10*3/uL (ref 4.0–10.5)
nRBC: 0 % (ref 0.0–0.2)

## 2021-03-28 LAB — SALICYLATE LEVEL: Salicylate Lvl: <7 mg/dL — ABNORMAL LOW (ref 7.0–30.0)

## 2021-03-28 LAB — ETHANOL: Alcohol, Ethyl (B): <10 mg/dL (ref <10)

## 2021-03-28 LAB — ACETAMINOPHEN LEVEL: Acetaminophen (Tylenol), Serum: <10 ug/mL — ABNORMAL LOW (ref 10–30)

## 2021-03-28 MED ORDER — ACETAMINOPHEN 500 MG PO TABS
1000.0000 mg | ORAL_TABLET | Freq: Once | ORAL | Status: AC
  Administered 2021-03-28: 1000 mg via ORAL
  Filled 2021-03-28: qty 2

## 2021-03-28 NOTE — ED Triage Notes (Signed)
Patient states that in the last few days he has heard voices telling him to kill himself. Patient states he has a plan to hang himself. Patient c/o 10/10 headache that has been present for 4-5 hours. Patient was dropped off at the ED.

## 2021-03-28 NOTE — ED Notes (Signed)
Pt requesting medication to 'stop the voices'. MD informed.

## 2021-03-28 NOTE — ED Notes (Signed)
Pt provided with snack

## 2021-03-28 NOTE — ED Provider Notes (Signed)
[facility] Emergency Department Provider Note  ____________________________________________   Event Date/Time   First MD Initiated Contact with Patient 03/28/21 2004     (approximate)  I have reviewed the triage vital signs and the nursing notes.   HISTORY  Chief Complaint Suicidal    HPI Curtis Cobb is a 50 y.o. male with anxiety, depression, alcohol abuse who comes in for SI.  Patient states that he has voices telling him to kill himself.  Patient has a plan to hang himself.  Patient also reports a headache as well but gradually worsening over the past few days, nothing makes better, nothing makes it worse.  He denies taking anything today.           Past Medical History:  Diagnosis Date  . Alcohol use disorder, moderate, dependence (HCC) 02/13/2014  . Anxiety   . Depression   . Hypertension   . Rheumatoid arthritis (HCC)   . Substance abuse [facility])     Patient Active Problem List   Diagnosis Date Noted  . Hypothyroidism 10/17/2018  . Tobacco use disorder 10/09/2018  . Cellulitis 08/23/2018  . 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 MDD (major depressive disorder), recurrent episode, severe (HCC) 11/17/2015  . Opioid use disorder, mild, in sustained remission (HCC) 10/08/2015  . Alcohol use disorder, severe, dependence (HCC) 10/07/2015  . Cannabis use disorder, moderate, dependence (HCC) 10/07/2015  . HTN (hypertension) 10/07/2015  . Rheumatoid arthritis (HCC) 10/07/2015  . Schizoaffective disorder, depressive type [facility])     Past Surgical History:  Procedure Laterality Date  . INCISION AND DRAINAGE ABSCESS Right 08/25/2018   Procedure: INCISION AND DRAINAGE ABSCESS;  Surgeon: Christena Flake, MD;  Location: [facility] ORS;  Service: Orthopedics;  Laterality: Right;  . INCISION AND DRAINAGE ABSCESS Right 08/30/2018   Procedure: INCISION AND  DRAINAGE ABSCESS;  Surgeon: Christena Flake, MD;  Location: [facility] ORS;  Service: Orthopedics;  Laterality: Right;  . none      Prior to Admission medications   Medication Sig Start Date End Date Taking? Authorizing Provider  amitriptyline (ELAVIL) 100 MG tablet Take 1 tablet (100 mg total) by mouth at bedtime. 10/17/18   Pucilowska, Braulio Conte B, MD  divalproex (DEPAKOTE) 500 MG DR tablet Take 1 tablet (500 mg total) by mouth every 12 (twelve) hours. 10/17/18   Pucilowska, Braulio Conte B, MD  FLUoxetine (PROZAC) 20 MG capsule Take 1 capsule (20 mg total) by mouth daily. 01/29/19 01/29/20  Myrna Blazer, MD  levothyroxine (SYNTHROID, LEVOTHROID) 25 MCG tablet Take 1 tablet (25 mcg total) by mouth daily before breakfast. 10/18/18   Pucilowska, Jolanta B, MD  naltrexone (DEPADE) 50 MG tablet Take 1 tablet (50 mg total) by mouth daily. 11/13/18   Governor Rooks, MD  prazosin (MINIPRESS) 1 MG capsule Take 1 capsule (1 mg total) by mouth 2 (two) times daily. Patient not taking: Reported on 11/12/2018 10/17/18   Pucilowska, Braulio Conte B, MD  QUEtiapine (SEROQUEL) 200 MG tablet Take 1 tablet (200 mg total) by mouth at bedtime. Patient taking differently: Take 200 mg by mouth at bedtime as needed (anxiety).  10/17/18   Pucilowska, Ellin Goodie, MD    Allergies Sulfa antibiotics  Family History  Problem Relation Age of Onset  . Heart attack Mother   . Stroke Mother   . Hypertension Mother   . Mental illness Mother        Manic Depressive Bipolar  Disorder  . Heart disease Mother   . Hypertension Maternal Aunt   . Mental illness Maternal Aunt   . Hypertension Maternal Aunt   . Mental illness Maternal Aunt     Social History Social History   Tobacco Use  . Smoking status: Never Smoker  . Smokeless tobacco: Never Used  Substance Use Topics  . Alcohol use: Yes    Comment: beer 6 hours ago 03/28/2021  . Drug use: No    Types: Amphetamines, Marijuana, Cocaine, Heroin    Comment: Patient denies recent  drug use.      Review of Systems Constitutional: No fever/chills Eyes: No visual changes. ENT: No sore throat. Cardiovascular: Denies chest pain. Respiratory: Denies shortness of breath. Gastrointestinal: No abdominal pain.  No nausea, no vomiting.  No diarrhea.  No constipation. Genitourinary: Negative for dysuria. Musculoskeletal: Negative for back pain. Skin: Negative for rash. Neurological: Positive headache, no focal weakness or numbness. Psych: SI All other ROS negative ____________________________________________   PHYSICAL EXAM:  VITAL SIGNS: ED Triage Vitals  Enc Vitals Group     BP 03/28/21 1950 (!) 133/109     Pulse Rate 03/28/21 1950 (!) 122     Resp 03/28/21 1950 (!) 22     Temp 03/28/21 1950 98 F (36.7 C)     Temp Source 03/28/21 1950 Oral     SpO2 03/28/21 1950 100 %     Weight 03/28/21 1950 200 lb (90.7 kg)     Height 03/28/21 1950 6\' 2"  (1.88 m)     Head Circumference --      Peak Flow --      Pain Score 03/28/21 1955 10     Pain Loc --      Pain Edu? --      Excl. in GC? --     Constitutional: Alert and oriented. Well appearing and in no acute distress. Eyes: Conjunctivae are normal. No swelling around eyes Head: Atraumatic.  No strangulation's marks around the neck  Nose: No congestion/rhinnorhea. Mouth/Throat: Mucous membranes are moist.   Neck: No stridor. Trachea Midline. FROM Cardiovascular: Normal rate, no swelling noted Respiratory: No increased wob, no stridor Gastrointestinal: Soft and nontender. No distention. No abdominal bruits.  Musculoskeletal: No lower extremity tenderness nor edema.  No joint effusions. Neurologic:  Normal speech and language. No gross focal neurologic deficits are appreciated.  Skin:  Skin is warm, dry and intact. No rash noted. Psychiatric:  GU: Deferred   ____________________________________________   LABS (all labs ordered are listed, but only abnormal results are displayed)  Labs Reviewed   COMPREHENSIVE METABOLIC PANEL - Abnormal; Notable for the following components:      Result Value   Sodium 134 (*)    Chloride 97 (*)    Glucose, Bld 120 (*)    Total Protein 8.4 (*)    All other components within normal limits  SALICYLATE LEVEL - Abnormal; Notable for the following components:   Salicylate Lvl <7.0 (*)    All other components within normal limits  ACETAMINOPHEN LEVEL - Abnormal; Notable for the following components:   Acetaminophen (Tylenol), Serum <10 (*)    All other components within normal limits  URINE DRUG SCREEN, QUALITATIVE ([facility] ONLY) - Abnormal; Notable for the following components:   Benzodiazepine, Ur Scrn POSITIVE (*)    All other components within normal limits  ETHANOL  CBC   ____________________________________________     RADIOLOGY  Official radiology report(s): CT Head Wo Contrast  Result Date: 03/28/2021 CLINICAL  DATA:  Headache EXAM: CT HEAD WITHOUT CONTRAST TECHNIQUE: Contiguous axial images were obtained from the base of the skull through the vertex without intravenous contrast. COMPARISON:  11/11/2018 FINDINGS: Brain: No acute intracranial abnormality. Specifically, no hemorrhage, hydrocephalus, mass lesion, acute infarction, or significant intracranial injury. Vascular: No hyperdense vessel or unexpected calcification. Skull: No acute calvarial abnormality. Sinuses/Orbits: Visualized paranasal sinuses and mastoids clear. Orbital soft tissues unremarkable. Other: None IMPRESSION: No acute intracranial abnormality. Electronically Signed   By: Charlett Nose M.D.   On: 03/28/2021 20:35    ____________________________________________   PROCEDURES  Procedure(s) performed (including Critical Care):  Procedures   ____________________________________________   INITIAL IMPRESSION / ASSESSMENT AND PLAN / ED COURSE  Curtis Cobb was evaluated in Emergency Department on 03/28/2021 for the symptoms described in the history of present  illness. He was evaluated in the context of the global COVID-19 pandemic, which necessitated consideration that the patient might be at risk for infection with the SARS-CoV-2 virus that causes COVID-19. Institutional protocols and algorithms that pertain to the evaluation of patients at risk for COVID-19 are in a state of rapid change based on information released by regulatory bodies including the CDC and federal and state organizations. These policies and algorithms were followed during the patient's care in the ED.    Pt is without any acute medical complaints except for headache.  Will get CT head to make sure no evidence of intracranial hemorrhage.  He denies any attempt to strangle himself to suggest needing a CTA to rule out dissection  CT head was negative.  No exam findings to suggest medical cause of current presentation. Will order psychiatric screening labs and discuss further w/ psychiatric service.  D/d includes but is not limited to psychiatric disease, behavioral/personality disorder, inadequate socioeconomic support, medical.  Based on HPI, exam, unremarkable labs, no concern for acute medical problem at this time. No rigidity, clonus, hyperthermia, focal neurologic deficit, diaphoresis, tachycardia, meningismus, ataxia, gait abnormality or other finding to suggest this visit represents a non-psychiatric problem. Screening labs reviewed.    Given this, pt medically cleared, to be dispositioned per Psych.    The patient has been placed in psychiatric observation due to the need to provide a safe environment for the patient while obtaining psychiatric consultation and evaluation, as well as ongoing medical and medication management to treat the patient's condition.  The patient has not been placed under full IVC at this time.        ____________________________________________   FINAL CLINICAL IMPRESSION(S) / ED DIAGNOSES   Final diagnoses:  Suicidal ideations  Intractable  headache, unspecified chronicity pattern, unspecified headache type      MEDICATIONS GIVEN DURING THIS VISIT:  Medications  acetaminophen (TYLENOL) tablet 1,000 mg (1,000 mg Oral Given 03/28/21 2039)     ED Discharge Orders    None       Note:  This document was prepared using Dragon voice recognition software and may include unintentional dictation errors.   Concha Se, MD 03/28/21 [phone]

## 2021-03-28 NOTE — BH Assessment (Signed)
Comprehensive Clinical Assessment (CCA) Note  03/28/2021 Curtis Cobb [id number] Recommendations for Services/Supports/Treatments: Consulted with Curtis Cobb., NP, who recommended pt. for overnight observation and reassessment in the AM. Notified Curtis Cobb Plan and Penni Bombard, RN of disposition recommendation.    Curtis Cobb is a 50 year old patient who presented to [facility] for suicidal ideations. Pt presented with tremulous psychomotor activity. Pt was resistant and dramatic throughout the assessment. Pt was noted to be in emotional distress. Pt presented with clear and coherent speech. Thought processes were intact. Pt presented with a pessimistic and hopeless mood. Pt had a responsive affect, with fleeting eye contact. When asked what brought him to the hospital the pt. stated, "It's traumatizing to keep going through this; I just want to be ok". Pt endorsed symptoms of depression, explaining that he feelings of hopelessness. Pt unable to identify specific stressors but verbalized that he is currently homeless. Pt admitted to suicidal ideations with a plan/intent hang himself. Pt denied current homicidal ideations. Pt has impaired insight and impulse control. Pt became increasingly irritated as the interview progress and resorted to aggressively grabbing his hair. Assessment was terminated early as the pt refused to answer assessment questions.   Flowsheet Row ED from 03/28/2021 in [facility] Admission (Discharged) from 10/09/2018 in [facility] INPATIENT BEHAVIORAL MEDICINE  C-SSRS RISK CATEGORY High Risk No Risk    The patient demonstrates the following risk factors for suicide: Chronic risk factors for suicide include: psychiatric disorder of schizoaffective disorder, depressed type. Acute risk factors for suicide include: unemployment. Protective factors for this patient include: n/a. Considering these factors, the overall suicide risk at this point appears to be high.  Patient is not appropriate for outpatient follow up.  Therefore, pt is recommended for 1:1 suicide sitter precautions at this time. Chief Complaint:  Chief Complaint  Patient presents with  . Suicidal   Visit Diagnosis: Major Depressive Disorder, recurrent severe    CCA Screening, Triage and Referral (STR)  Patient Reported Information How did you hear about [place]? Self  Referral name: Self  Referral phone number: No data recorded  Whom do you see for routine medical problems? I don't have a doctor  Practice/Facility Name: No data recorded Practice/Facility Phone Number: No data recorded Name of Contact: No data recorded Contact Number: No data recorded Contact Fax Number: No data recorded Prescriber Name: No data recorded Prescriber Address (if known): No data recorded  What Is the Reason for Your Visit/Call Today? Suicidal ideation  How Long Has This Been Causing You Problems? > than 6 months  What Do You Feel Would Help You the Most Today? Treatment for Depression or other mood problem   Have You Recently Been in Any Inpatient Treatment (Hospital/Detox/Crisis Center/28-Day Program)? No  Name/Location of Program/Hospital:No data recorded How Long Were You There? No data recorded When Were You Discharged? No data recorded  Have You Ever Received Services From [facility] Before? No  Who Do You See at [facility]? No data recorded  Have You Recently Had Any Thoughts About Hurting Yourself? Yes  Are You Planning to Commit Suicide/Harm Yourself At This time? Yes   Have you Recently Had Thoughts About Hurting Someone [name]? No  Explanation: No data recorded  Have You Used Any Alcohol or Drugs in the Past 24 Hours? No  How Long Ago Did You Use Drugs or Alcohol? No data recorded What Did You Use and How Much? No data recorded  Do You Currently Have a Therapist/Psychiatrist? No  Name of Therapist/Psychiatrist: No data recorded  Have You Been Recently Discharged  From Any Office Practice or Programs? No  Explanation of Discharge From Practice/Program: No data recorded    CCA Screening Triage Referral Assessment Type of Contact: Face-to-Face  Is this Initial or Reassessment? No data recorded Date Telepsych consult ordered in [facility]:  No data recorded Time Telepsych consult ordered in [facility]:  No data recorded  Patient Reported Information Reviewed? Yes  Patient Left Without Being Seen? No data recorded Reason for Not Completing Assessment: No data recorded  Collateral Involvement: None   Does Patient Have a Court Appointed Legal Guardian? No data recorded Name and Contact of Legal Guardian: No data recorded If Minor and Not Living with Parent(s), Who has Custody? n/a  Is CPS involved or ever been involved? -- (None reported)  Is APS involved or ever been involved? -- (None reported)   Patient Determined To Be At Risk for Harm To Self or Others Based on Review of Patient Reported Information or Presenting Complaint? No  Method: No data recorded Availability of Means: No data recorded Intent: No data recorded Notification Required: No data recorded Additional Information for Danger to Others Potential: No data recorded Additional Comments for Danger to Others Potential: No data recorded Are There Guns or Other Weapons in Your Home? No data recorded Types of Guns/Weapons: No data recorded Are These Weapons Safely Secured?                            No data recorded Who Could Verify You Are Able To Have These Secured: No data recorded Do You Have any Outstanding Charges, Pending Court Dates, Parole/Probation? No data recorded Contacted To Inform of Risk of Harm To Self or Others: No data recorded  Location of Assessment: [facility] ED   Does Patient Present under Involuntary Commitment? No  IVC Papers Initial File Date: No data recorded  [place] of Residence: [place]   Patient Currently Receiving the Following Services: Not Receiving  Services   Determination of Need: Emergent (2 hours)   Options For Referral: Other: Comment (Per Curtis Cobb., NP pt is recommended for overnight observation and reassessment in the AM)     CCA Biopsychosocial Intake/Chief Complaint:  suicidal ideation  Current Symptoms/Problems: Depression   Patient Reported Schizophrenia/Schizoaffective Diagnosis in Past: No   Strengths: There is no evidence of self-care impairments.  Preferences: None reported  Abilities: Asks for help   Type of Services Patient Feels are Needed: Unable to identify services needed,   Initial Clinical Notes/Concerns: No data recorded  Mental Health Symptoms Depression:  Hopelessness; Irritability   Duration of Depressive symptoms: Greater than two weeks   Mania:  None   Anxiety:   Tension; Irritability   Psychosis:  Hallucinations   Duration of Psychotic symptoms: Greater than six months   Trauma:  None   Obsessions:  None   Compulsions:  None   Inattention:  None   Hyperactivity/Impulsivity:  N/A   Oppositional/Defiant Behaviors:  None   Emotional Irregularity:  Recurrent suicidal behaviors/gestures/threats   Other Mood/Personality Symptoms:  No data recorded   Mental Status Exam Appearance and self-care  Stature:  Tall   Weight:  Average weight   Clothing:  Casual   Grooming:  Normal   Cosmetic use:  None   Posture/gait:  Normal   Motor activity:  Tremor   Sensorium  Attention:  Normal   Concentration:  Anxiety interferes   Orientation:  Person; Object; Place; Situation; Time   Recall/memory:  Normal   Affect and Mood  Affect:  Labile   Mood:  Hopeless; Pessimistic   Relating  Eye contact:  Avoided   Facial expression:  Anxious   Attitude toward examiner:  Dramatic   Thought and Language  Speech flow: Normal   Thought content:  Appropriate to Mood and Circumstances   Preoccupation:  None   Hallucinations:  Auditory   Organization:  No data  recorded  [employer] of Knowledge:  Average   Intelligence:  Average   Abstraction:  Normal   Judgement:  Normal   Reality Testing:  Adequate   Insight:  Lacking   Decision Making:  Normal   Social Functioning  Social Maturity:  Impulsive   Social Judgement:  Victimized   Stress  Stressors:  Housing   Coping Ability:  Human resources officer Deficits:  Responsibility   Supports:  Support needed     Religion: Religion/Spirituality Are You A Religious Person?:  (UTA) How Might This Affect Treatment?: N/A  Leisure/Recreation: Leisure / Recreation Do You Have Hobbies?:  (UTA)  Exercise/Diet: Exercise/Diet Do You Exercise?:  (UTA) Have You Gained or Lost A Significant Amount of Weight in the Past Six Months?:  (UTA) Do You Follow a Special Diet?: No Do You Have Any Trouble Sleeping?:  (UTA)   CCA Employment/Education Employment/Work Situation: Employment / Work Situation Employment situation: Unemployed Has patient ever been in the [employer]?: No  Education:     CCA Family/Childhood History Family and Relationship History: Family history Marital status: Single Are you sexually active?:  (UTA) What is your sexual orientation?: Heterosexual Has your sexual activity been affected by drugs, alcohol, medication, or emotional stress?:  (UTA) Does patient have children?: No  Childhood History:  Childhood History By whom was/is the patient raised?: Mother Additional childhood history information: None reported  Child/Adolescent Assessment:     CCA Substance Use Alcohol/Drug Use: Alcohol / Drug Use Pain Medications: See PTA Prescriptions: See PTA Over the Counter: See PTA History of alcohol / drug use?: Yes Longest period of sobriety (when/how long): Unable to quantify                          ASAM's:  Six Dimensions of Multidimensional Assessment  Dimension 1:  Acute Intoxication and/or Withdrawal Potential:       Dimension 2:  Biomedical Conditions and Complications:      Dimension 3:  Emotional, Behavioral, or Cognitive Conditions and Complications:     Dimension 4:  Readiness to Change:     Dimension 5:  Relapse, Continued use, or Continued Problem Potential:     Dimension 6:  Recovery/Living Environment:     ASAM Severity Score:    ASAM Recommended Level of Treatment:     Substance use Disorder (SUD)    Recommendations for Services/Supports/Treatments:    DSM5 Diagnoses: Patient Active Problem List   Diagnosis Date Noted  . Hypothyroidism 10/17/2018  . Tobacco use disorder 10/09/2018  . Cellulitis 08/23/2018  . 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 MDD (major depressive disorder), recurrent episode, severe (HCC) 11/17/2015  . Opioid use disorder, mild, in sustained remission (HCC) 10/08/2015  . Alcohol use disorder, severe, dependence (HCC) 10/07/2015  . Cannabis use disorder, moderate, dependence (HCC) 10/07/2015  . HTN (hypertension) 10/07/2015  . Rheumatoid arthritis (HCC) 10/07/2015  . Schizoaffective  disorder, depressive type (HCC)     Tela Kotecki R Aisha Greenberger, LCAS

## 2021-03-28 NOTE — Consult Note (Incomplete)
[facility] Face-to-Face Psychiatry Consult   Reason for Consult: Suicidal Referring Physician: Dr. Fuller Plan Patient Identification: Curtis Cobb MRN:  [id number] Principal Diagnosis: <principal problem not specified> Diagnosis:  Active Problems:   Schizoaffective disorder, depressive type (HCC)   MDD (major depressive disorder), recurrent episode, severe (HCC)   Personality disorder cluster b   Tobacco use disorder   Total Time spent with patient: 20 minutes  Subjective: " I do not think anything can help me." Curtis Cobb is a 50 y.o. male patient presented to [facility] ED via POV voluntarily due to patient voicing suicidal ideation with a plan to hang himself.  The patient disclosed that he has been feeling this way for 10 years.  The patient denies any substance use but EDS She voiced levels him to be positive for benzodiazepine.  The patient states he takes the patient was seen face-to-face by this provider; chart reviewed and consulted with Dr. ED providers and Dr. Jenel Lucks who ever doc on call on 04/00/2022 due to the care of the patient. It was discussed with both providers that the patient does/does not meet criteria to be admitted to the inpatient unit.  On evaluation the patient reports....  During his/her evaluation,  @ is alert and oriented x4, calm and cooperative, and mood-congruent with affect.  The patient does not appear to be responding to internal or external stimuli. Neither is the patient presenting with any delusional thinking. The patient denies auditory or visual hallucinations. The patient denies any suicidal, homicidal, or self-harm ideations. The patient is not presenting with any psychotic or paranoid behaviors. During an encounter with the patient, he/she was able to answer questions appropriately. Make sure information about collaborative is in the notes Ex. Collateral was obtained by mother who expresses concerns for patient's suicidal thoughts.    HPI: Per Dr.  Fuller Plan, Curtis Cobb is a 50 y.o. male with anxiety, depression, alcohol abuse who comes in for SI.  Patient states that he has voices telling him to kill himself.  Patient has a plan to hang himself.  Patient also reports a headache as well but gradually worsening over the past few days, nothing makes better, nothing makes it worse.  He denies taking anything today.  Past Psychiatric History:  Alcohol use disorder, moderate, dependence (HCC) Anxiety Depression Substance abuse (HCC)  Risk to Self:   Risk to Others:   Prior Inpatient Therapy:   Prior Outpatient Therapy:    Past Medical History:  Past Medical History:  Diagnosis Date  . Alcohol use disorder, moderate, dependence (HCC) 02/13/2014  . Anxiety   . Depression   . Hypertension   . Rheumatoid arthritis (HCC)   . Substance abuse [facility])     Past Surgical History:  Procedure Laterality Date  . INCISION AND DRAINAGE ABSCESS Right 08/25/2018   Procedure: INCISION AND DRAINAGE ABSCESS;  Surgeon: Christena Flake, MD;  Location: [facility] ORS;  Service: Orthopedics;  Laterality: Right;  . INCISION AND DRAINAGE ABSCESS Right 08/30/2018   Procedure: INCISION AND DRAINAGE ABSCESS;  Surgeon: Christena Flake, MD;  Location: [facility] ORS;  Service: Orthopedics;  Laterality: Right;  . none     Family History:  Family History  Problem Relation Age of Onset  . Heart attack Mother   . Stroke Mother   . Hypertension Mother   . Mental illness Mother        Manic Depressive Bipolar Disorder  . Heart disease Mother   . Hypertension Maternal Aunt   .  Mental illness Maternal Aunt   . Hypertension Maternal Aunt   . Mental illness Maternal Aunt    Family Psychiatric  History: *** Social History:  Social History   Substance and Sexual Activity  Alcohol Use Yes   Comment: beer 6 hours ago 03/28/2021     Social History   Substance and Sexual Activity  Drug Use No  . Types: Amphetamines, Marijuana, Cocaine, Heroin   Comment: Patient denies  recent drug use.    Social History   Socioeconomic History  . Marital status: Single    Spouse name: Not on file  . Number of children: Not on file  . Years of education: Not on file  . Highest education level: Not on file  Occupational History  . Not on file  Tobacco Use  . Smoking status: Never Smoker  . Smokeless tobacco: Never Used  Substance and Sexual Activity  . Alcohol use: Yes    Comment: beer 6 hours ago 03/28/2021  . Drug use: No    Types: Amphetamines, Marijuana, Cocaine, Heroin    Comment: Patient denies recent drug use.  Marland Kitchen Sexual activity: Yes    Birth control/protection: None  Other Topics Concern  . Not on file  Social History Narrative  . Not on file   Social Determinants of Health   Financial Resource Strain: Not on file  Food Insecurity: Not on file  Transportation Needs: Not on file  Physical Activity: Not on file  Stress: Not on file  Social Connections: Not on file   Additional Social History:    Allergies:   Allergies  Allergen Reactions  . Sulfa Antibiotics Other (See Comments)    Unknown reaction    Labs:  Results for orders placed or performed during the hospital encounter of 03/28/21 (from the past 48 hour(s))  Comprehensive metabolic panel     Status: Abnormal   Collection Time: 03/28/21  7:51 PM  Result Value Ref Range   Sodium 134 (L) 135 - 145 mmol/L   Potassium 4.2 3.5 - 5.1 mmol/L   Chloride 97 (L) 98 - 111 mmol/L   CO2 24 22 - 32 mmol/L   Glucose, Bld 120 (H) 70 - 99 mg/dL    Comment: Glucose reference range applies only to samples taken after fasting for at least 8 hours.   BUN 12 6 - 20 mg/dL   Creatinine, Ser 1.02 0.61 - 1.24 mg/dL   Calcium 9.3 8.9 - 58.5 mg/dL   Total Protein 8.4 (H) 6.5 - 8.1 g/dL   Albumin 4.4 3.5 - 5.0 g/dL   AST 27 15 - 41 U/L   ALT 21 0 - 44 U/L   Alkaline Phosphatase 67 38 - 126 U/L   Total Bilirubin 1.1 0.3 - 1.2 mg/dL   GFR, Estimated >27 >78 mL/min    Comment: (NOTE) Calculated using  the CKD-EPI Creatinine Equation (2021)    Anion gap 13 5 - 15    Comment: Performed at [facility], [address] [place], [place] [address]  Ethanol     Status: None   Collection Time: 03/28/21  7:51 PM  Result Value Ref Range   Alcohol, Ethyl (B) <10 <10 mg/dL    Comment: (NOTE) Lowest detectable limit for serum alcohol is 10 mg/dL.  For medical purposes only. Performed at [facility], [address] [place], [place] [address]   Salicylate level     Status: Abnormal   Collection Time: 03/28/21  7:51 PM  Result Value  Ref Range   Salicylate Lvl <7.0 (L) 7.0 - 30.0 mg/dL    Comment: Performed at [facility], [address] [place], [place] [address]  Acetaminophen level     Status: Abnormal   Collection Time: 03/28/21  7:51 PM  Result Value Ref Range   Acetaminophen (Tylenol), Serum <10 (L) 10 - 30 ug/mL    Comment: (NOTE) Therapeutic concentrations vary significantly. A range of 10-30 ug/mL  may be an effective concentration for many patients. However, some  are best treated at concentrations outside of this range. Acetaminophen concentrations >150 ug/mL at 4 hours after ingestion  and >50 ug/mL at 12 hours after ingestion are often associated with  toxic reactions.  Performed at [facility], [address] [place], [place] [address]   cbc     Status: None   Collection Time: 03/28/21  7:51 PM  Result Value Ref Range   WBC 9.9 4.0 - 10.5 K/uL   RBC 5.20 4.22 - 5.81 MIL/uL   Hemoglobin 15.4 13.0 - 17.0 g/dL   HCT 35.5 73.2 - 20.2 %   MCV 88.5 80.0 - 100.0 fL   MCH 29.6 26.0 - 34.0 pg   MCHC 33.5 30.0 - 36.0 g/dL   RDW 54.2 70.6 - 23.7 %   Platelets 272 150 - 400 K/uL   nRBC 0.0 0.0 - 0.2 %    Comment: Performed at [facility], [address] [place], [place] [address]  Urine Drug Screen, Qualitative     Status: Abnormal   Collection Time: 03/28/21  9:35 PM  Result Value Ref Range   Tricyclic, Ur Screen NONE  DETECTED NONE DETECTED   Amphetamines, Ur Screen NONE DETECTED NONE DETECTED   MDMA (Ecstasy)Ur Screen NONE DETECTED NONE DETECTED   Cocaine Metabolite,Ur [place] NONE DETECTED NONE DETECTED   Opiate, Ur Screen NONE DETECTED NONE DETECTED   Phencyclidine (PCP) Ur S NONE DETECTED NONE DETECTED   Cannabinoid 50 Ng, Ur [place] NONE DETECTED NONE DETECTED   Barbiturates, Ur Screen NONE DETECTED NONE DETECTED   Benzodiazepine, Ur Scrn POSITIVE (A) NONE DETECTED   Methadone Scn, Ur NONE DETECTED NONE DETECTED    Comment: (NOTE) Tricyclics + metabolites, urine    Cutoff 1000 ng/mL Amphetamines + metabolites, urine  Cutoff 1000 ng/mL MDMA (Ecstasy), urine              Cutoff 500 ng/mL Cocaine Metabolite, urine          Cutoff 300 ng/mL Opiate + metabolites, urine        Cutoff 300 ng/mL Phencyclidine (PCP), urine         Cutoff 25 ng/mL Cannabinoid, urine                 Cutoff 50 ng/mL Barbiturates + metabolites, urine  Cutoff 200 ng/mL Benzodiazepine, urine              Cutoff 200 ng/mL Methadone, urine                   Cutoff 300 ng/mL  The urine drug screen provides only a preliminary, unconfirmed analytical test result and should not be used for non-medical purposes. Clinical consideration and professional judgment should be applied to any positive drug screen result due to possible interfering substances. A more specific alternate chemical method must be used in order to obtain a confirmed analytical result. Gas chromatography / mass spectrometry (GC/MS) is the preferred confirm atory method. Performed at [facility], [phone]  [address] [place], [place] [address]     No current facility-administered medications for this encounter.   Current Outpatient Medications  Medication Sig Dispense Refill  . amitriptyline (ELAVIL) 100 MG tablet Take 1 tablet (100 mg total) by mouth at bedtime. 30 tablet 1  . divalproex (DEPAKOTE) 500 MG DR tablet Take 1 tablet (500 mg total) by mouth every  12 (twelve) hours. 60 tablet 1  . FLUoxetine (PROZAC) 20 MG capsule Take 1 capsule (20 mg total) by mouth daily. 30 capsule 0  . levothyroxine (SYNTHROID, LEVOTHROID) 25 MCG tablet Take 1 tablet (25 mcg total) by mouth daily before breakfast. 30 tablet 1  . naltrexone (DEPADE) 50 MG tablet Take 1 tablet (50 mg total) by mouth daily. 14 tablet 0  . prazosin (MINIPRESS) 1 MG capsule Take 1 capsule (1 mg total) by mouth 2 (two) times daily. (Patient not taking: Reported on 11/12/2018) 60 capsule 1  . QUEtiapine (SEROQUEL) 200 MG tablet Take 1 tablet (200 mg total) by mouth at bedtime. (Patient taking differently: Take 200 mg by mouth at bedtime as needed (anxiety). ) 30 tablet 1    Musculoskeletal: Strength & Muscle Tone: within normal limits Gait & Station: normal Patient leans: N/A            Psychiatric Specialty Exam:  Presentation  General Appearance: Bizarre; Disheveled  Eye Contact:Fair  Speech:Clear and Coherent; Normal Rate  Speech Volume:Normal  Handedness:Right   Mood and Affect  Mood:Euthymic  Affect:Appropriate   Thought Process  Thought Processes:Coherent  Descriptions of Associations:Intact  Orientation:Full (Time, Place and Person)  Thought Content:Logical  History of Schizophrenia/Schizoaffective disorder:No data recorded Duration of Psychotic Symptoms:No data recorded Hallucinations:Hallucinations: Auditory Description of Auditory Hallucinations: Unable to describe the voices  Ideas of Reference:None  Suicidal Thoughts:Suicidal Thoughts: Yes, Active SI Active Intent and/or Plan: With Plan; With Means to Carry Out  Homicidal Thoughts:Homicidal Thoughts: Yes, Passive   Sensorium  Memory:Immediate Good; Recent Good; Remote Good  Judgment:Good  Insight:Good   Executive Functions  Concentration:Good  Attention Span:Good  Recall:Good  Fund of Knowledge:Good  Language:Good   Psychomotor Activity  Psychomotor  Activity:Psychomotor Activity: Normal   Assets  Assets:Desire for Improvement; Housing; Resilience; Social Support   Sleep  Sleep:Sleep: Poor   Physical Exam: Physical Exam Vitals and nursing note reviewed.  Constitutional:      Appearance: Normal appearance. He is normal weight.  HENT:     Nose: Nose normal.     Mouth/Throat:     Mouth: Mucous membranes are moist.  Cardiovascular:     Rate and Rhythm: Tachycardia present.  Pulmonary:     Effort: Pulmonary effort is normal.  Musculoskeletal:        General: Normal range of motion.     Cervical back: Normal range of motion.  Neurological:     Mental Status: He is alert.  Psychiatric:        Attention and Perception: Attention normal. He perceives auditory hallucinations.        Mood and Affect: Mood and affect normal.        Speech: Speech normal.        Behavior: Behavior normal. Behavior is cooperative.        Thought Content: Thought content includes suicidal ideation. Thought content includes suicidal plan.        Cognition and Memory: Cognition and memory normal.        Judgment: Judgment normal.    Review of Systems  Psychiatric/Behavioral: Positive for depression, hallucinations,  substance abuse and suicidal ideas.  All other systems reviewed and are negative.  Blood pressure (!) 133/109, pulse (!) 122, temperature 98 F (36.7 C), temperature source Oral, resp. rate (!) 22, height  (1.88 m), weight 90.7 kg, SpO2 100 %. Body mass index is 25.68 kg/m.  Treatment Plan Summary: Plan The patient remained under observation overnight and will be reassessed in the a.m. to determine if he meets the criteria for psychiatric inpatient admission; he could be discharged back home.  Disposition: Supportive therapy provided about ongoing stressors. The patient remained under observation overnight and will be reassessed in the a.m. to determine if he meets the criteria for psychiatric inpatient admission; he could be  discharged back home.  Gillermo Murdoch, NP 03/28/2021 11:31 PM

## 2021-03-28 NOTE — Consult Note (Signed)
[facility] Face-to-Face Psychiatry Consult   Reason for Consult: Suicidal Referring Physician: Dr. Fuller Plan Patient Identification: Curtis Cobb MRN:  [id number] Principal Diagnosis: <principal problem not specified> Diagnosis:  Active Problems:   Schizoaffective disorder, depressive type (HCC)   MDD (major depressive disorder), recurrent episode, severe (HCC)   Personality disorder cluster b   Tobacco use disorder   Total Time spent with patient: 20 minutes  Subjective: " I do not think anything can help me." Curtis Cobb is a 50 y.o. male patient presented to [facility] ED via POV voluntarily due to the patient voicing suicidal ideation with a plan to hang himself.  The patient disclosed that he has been feeling this way for ten years.  The patient denies any substance use, but UDS shows him to be positive for benzodiazepine.  The patient states he takes Remeron, BuSpar, Prozac, and Abilify, but he voiced not knowing the milligrams he takes. The patient also states, "I know no medication will be able to help me."   Per the ED triage nurse note, the patient states that in the last few days he has heard voices telling him to kill himself. Patient states he has a plan to hang himself. Patient c/o 10/10 headache that has been present for 4-5 hours. Patient was dropped off at the ED.  The patient was seen face-to-face by this provider; the chart was reviewed and consulted with Dr. Fuller Plan on 03/28/2021 due to the patient's care. It was discussed with the EDP that the patient remained under observation overnight and will be reassessed in the a.m. to determine if he meets the criteria for psychiatric inpatient admission; he could be discharged back home.  On evaluation, the patient is alert and oriented x 4, calm, cooperative, and mood-congruent with affect. The patient does not appear to be responding to internal or external stimuli. Neither is the patient presenting with any delusional thinking. The  patient admits to auditory hallucinations but denies visual hallucinations. The patient admits to suicidal and homicidal ideations. He voiced that his suicidal plan is to hang himself. He cannot disclose his homicidal intent nor who he wants to hurt; the patient is not presenting with any psychotic or paranoid behaviors. During an encounter with the patient, he was able to answer questions appropriately.  HPI: Per Dr. Fuller Plan, Curtis Cobb is a 50 y.o. male with anxiety, depression, alcohol abuse who comes in for SI.  Patient states that he has voices telling him to kill himself.  Patient has a plan to hang himself.  Patient also reports a headache as well but gradually worsening over the past few days, nothing makes better, nothing makes it worse.  He denies taking anything today.  Past Psychiatric History:  Alcohol use disorder, moderate, dependence (HCC) Anxiety Depression Substance abuse (HCC)  Risk to Self:   Risk to Others:   Prior Inpatient Therapy:   Prior Outpatient Therapy:    Past Medical History:  Past Medical History:  Diagnosis Date  . Alcohol use disorder, moderate, dependence (HCC) 02/13/2014  . Anxiety   . Depression   . Hypertension   . Rheumatoid arthritis (HCC)   . Substance abuse [facility])     Past Surgical History:  Procedure Laterality Date  . INCISION AND DRAINAGE ABSCESS Right 08/25/2018   Procedure: INCISION AND DRAINAGE ABSCESS;  Surgeon: Christena Flake, MD;  Location: [facility] ORS;  Service: Orthopedics;  Laterality: Right;  . INCISION AND DRAINAGE ABSCESS Right 08/30/2018   Procedure: INCISION AND DRAINAGE  ABSCESS;  Surgeon: Christena Flake, MD;  Location: [facility] ORS;  Service: Orthopedics;  Laterality: Right;  . none     Family History:  Family History  Problem Relation Age of Onset  . Heart attack Mother   . Stroke Mother   . Hypertension Mother   . Mental illness Mother        Manic Depressive Bipolar Disorder  . Heart disease Mother   . Hypertension  Maternal Aunt   . Mental illness Maternal Aunt   . Hypertension Maternal Aunt   . Mental illness Maternal Aunt    Family Psychiatric  History:  Social History:  Social History   Substance and Sexual Activity  Alcohol Use Yes   Comment: beer 6 hours ago 03/28/2021     Social History   Substance and Sexual Activity  Drug Use No  . Types: Amphetamines, Marijuana, Cocaine, Heroin   Comment: Patient denies recent drug use.    Social History   Socioeconomic History  . Marital status: Single    Spouse name: Not on file  . Number of children: Not on file  . Years of education: Not on file  . Highest education level: Not on file  Occupational History  . Not on file  Tobacco Use  . Smoking status: Never Smoker  . Smokeless tobacco: Never Used  Substance and Sexual Activity  . Alcohol use: Yes    Comment: beer 6 hours ago 03/28/2021  . Drug use: No    Types: Amphetamines, Marijuana, Cocaine, Heroin    Comment: Patient denies recent drug use.  Marland Kitchen Sexual activity: Yes    Birth control/protection: None  Other Topics Concern  . Not on file  Social History Narrative  . Not on file   Social Determinants of Health   Financial Resource Strain: Not on file  Food Insecurity: Not on file  Transportation Needs: Not on file  Physical Activity: Not on file  Stress: Not on file  Social Connections: Not on file   Additional Social History:    Allergies:   Allergies  Allergen Reactions  . Sulfa Antibiotics Other (See Comments)    Unknown reaction    Labs:  Results for orders placed or performed during the hospital encounter of 03/28/21 (from the past 48 hour(s))  Comprehensive metabolic panel     Status: Abnormal   Collection Time: 03/28/21  7:51 PM  Result Value Ref Range   Sodium 134 (L) 135 - 145 mmol/L   Potassium 4.2 3.5 - 5.1 mmol/L   Chloride 97 (L) 98 - 111 mmol/L   CO2 24 22 - 32 mmol/L   Glucose, Bld 120 (H) 70 - 99 mg/dL    Comment: Glucose reference range applies  only to samples taken after fasting for at least 8 hours.   BUN 12 6 - 20 mg/dL   Creatinine, Ser 1.61 0.61 - 1.24 mg/dL   Calcium 9.3 8.9 - 09.6 mg/dL   Total Protein 8.4 (H) 6.5 - 8.1 g/dL   Albumin 4.4 3.5 - 5.0 g/dL   AST 27 15 - 41 U/L   ALT 21 0 - 44 U/L   Alkaline Phosphatase 67 38 - 126 U/L   Total Bilirubin 1.1 0.3 - 1.2 mg/dL   GFR, Estimated >04 >54 mL/min    Comment: (NOTE) Calculated using the CKD-EPI Creatinine Equation (2021)    Anion gap 13 5 - 15    Comment: Performed at [facility], [address], [place], [place] [address]  Ethanol     Status: None   Collection Time: 03/28/21  7:51 PM  Result Value Ref Range   Alcohol, Ethyl (B) <10 <10 mg/dL    Comment: (NOTE) Lowest detectable limit for serum alcohol is 10 mg/dL.  For medical purposes only. Performed at [facility], [address] [place], [place] [address]   Salicylate level     Status: Abnormal   Collection Time: 03/28/21  7:51 PM  Result Value Ref Range   Salicylate Lvl <7.0 (L) 7.0 - 30.0 mg/dL    Comment: Performed at [facility], [address] [place], [place] [address]  Acetaminophen level     Status: Abnormal   Collection Time: 03/28/21  7:51 PM  Result Value Ref Range   Acetaminophen (Tylenol), Serum <10 (L) 10 - 30 ug/mL    Comment: (NOTE) Therapeutic concentrations vary significantly. A range of 10-30 ug/mL  may be an effective concentration for many patients. However, some  are best treated at concentrations outside of this range. Acetaminophen concentrations >150 ug/mL at 4 hours after ingestion  and >50 ug/mL at 12 hours after ingestion are often associated with  toxic reactions.  Performed at [facility], [address] [place], [place] [address]   cbc     Status: None   Collection Time: 03/28/21  7:51 PM  Result Value Ref Range   WBC 9.9 4.0 - 10.5 K/uL   RBC 5.20 4.22 - 5.81 MIL/uL   Hemoglobin 15.4 13.0 - 17.0 g/dL   HCT 91.4  78.2 - 95.6 %   MCV 88.5 80.0 - 100.0 fL   MCH 29.6 26.0 - 34.0 pg   MCHC 33.5 30.0 - 36.0 g/dL   RDW 21.3 08.6 - 57.8 %   Platelets 272 150 - 400 K/uL   nRBC 0.0 0.0 - 0.2 %    Comment: Performed at [facility], [address] [place], [place] [address]  Urine Drug Screen, Qualitative     Status: Abnormal   Collection Time: 03/28/21  9:35 PM  Result Value Ref Range   Tricyclic, Ur Screen NONE DETECTED NONE DETECTED   Amphetamines, Ur Screen NONE DETECTED NONE DETECTED   MDMA (Ecstasy)Ur Screen NONE DETECTED NONE DETECTED   Cocaine Metabolite,Ur [place] NONE DETECTED NONE DETECTED   Opiate, Ur Screen NONE DETECTED NONE DETECTED   Phencyclidine (PCP) Ur S NONE DETECTED NONE DETECTED   Cannabinoid 50 Ng, Ur [place] NONE DETECTED NONE DETECTED   Barbiturates, Ur Screen NONE DETECTED NONE DETECTED   Benzodiazepine, Ur Scrn POSITIVE (A) NONE DETECTED   Methadone Scn, Ur NONE DETECTED NONE DETECTED    Comment: (NOTE) Tricyclics + metabolites, urine    Cutoff 1000 ng/mL Amphetamines + metabolites, urine  Cutoff 1000 ng/mL MDMA (Ecstasy), urine              Cutoff 500 ng/mL Cocaine Metabolite, urine          Cutoff 300 ng/mL Opiate + metabolites, urine        Cutoff 300 ng/mL Phencyclidine (PCP), urine         Cutoff 25 ng/mL Cannabinoid, urine                 Cutoff 50 ng/mL Barbiturates + metabolites, urine  Cutoff 200 ng/mL Benzodiazepine, urine              Cutoff 200 ng/mL Methadone, urine  Cutoff 300 ng/mL  The urine drug screen provides only a preliminary, unconfirmed analytical test result and should not be used for non-medical purposes. Clinical consideration and professional judgment should be applied to any positive drug screen result due to possible interfering substances. A more specific alternate chemical method must be used in order to obtain a confirmed analytical result. Gas chromatography / mass spectrometry (GC/MS) is the preferred confirm atory  method. Performed at [facility], [address] [place], [place] [address]     No current facility-administered medications for this encounter.   Current Outpatient Medications  Medication Sig Dispense Refill  . amitriptyline (ELAVIL) 100 MG tablet Take 1 tablet (100 mg total) by mouth at bedtime. 30 tablet 1  . divalproex (DEPAKOTE) 500 MG DR tablet Take 1 tablet (500 mg total) by mouth every 12 (twelve) hours. 60 tablet 1  . FLUoxetine (PROZAC) 20 MG capsule Take 1 capsule (20 mg total) by mouth daily. 30 capsule 0  . levothyroxine (SYNTHROID, LEVOTHROID) 25 MCG tablet Take 1 tablet (25 mcg total) by mouth daily before breakfast. 30 tablet 1  . naltrexone (DEPADE) 50 MG tablet Take 1 tablet (50 mg total) by mouth daily. 14 tablet 0  . prazosin (MINIPRESS) 1 MG capsule Take 1 capsule (1 mg total) by mouth 2 (two) times daily. (Patient not taking: Reported on 11/12/2018) 60 capsule 1  . QUEtiapine (SEROQUEL) 200 MG tablet Take 1 tablet (200 mg total) by mouth at bedtime. (Patient taking differently: Take 200 mg by mouth at bedtime as needed (anxiety). ) 30 tablet 1    Musculoskeletal: Strength & Muscle Tone: within normal limits Gait & Station: normal Patient leans: N/A Psychiatric Specialty Exam:  Presentation  General Appearance: Bizarre; Disheveled  Eye Contact:Fair  Speech:Clear and Coherent; Normal Rate  Speech Volume:Normal  Handedness:Right   Mood and Affect  Mood:Euthymic  Affect:Appropriate   Thought Process  Thought Processes:Coherent  Descriptions of Associations:Intact  Orientation:Full (Time, Place and Person)  Thought Content:Logical  History of Schizophrenia/Schizoaffective disorder:No data recorded Duration of Psychotic Symptoms:No data recorded Hallucinations:Hallucinations: Auditory Description of Auditory Hallucinations: Unable to describe the voices  Ideas of Reference:None  Suicidal Thoughts:Suicidal Thoughts: Yes,  Active SI Active Intent and/or Plan: With Plan; With Means to Carry Out  Homicidal Thoughts:Homicidal Thoughts: Yes, Passive   Sensorium  Memory:Immediate Good; Recent Good; Remote Good  Judgment:Good  Insight:Good   Executive Functions  Concentration:Good  Attention Span:Good  Recall:Good  Fund of Knowledge:Good  Language:Good   Psychomotor Activity  Psychomotor Activity:Psychomotor Activity: Normal   Assets  Assets:Desire for Improvement; Housing; Resilience; Social Support   Sleep  Sleep:Sleep: Poor   Physical Exam: Physical Exam Vitals and nursing note reviewed.  Constitutional:      Appearance: Normal appearance. He is normal weight.  HENT:     Nose: Nose normal.     Mouth/Throat:     Mouth: Mucous membranes are moist.  Cardiovascular:     Rate and Rhythm: Tachycardia present.  Pulmonary:     Effort: Pulmonary effort is normal.  Musculoskeletal:        General: Normal range of motion.     Cervical back: Normal range of motion.  Neurological:     Mental Status: He is alert.  Psychiatric:        Attention and Perception: Attention normal. He perceives auditory hallucinations.        Mood and Affect: Mood and affect normal.        Speech: Speech normal.  Behavior: Behavior normal. Behavior is cooperative.        Thought Content: Thought content includes suicidal ideation. Thought content includes suicidal plan.        Cognition and Memory: Cognition and memory normal.        Judgment: Judgment normal.    Review of Systems  Psychiatric/Behavioral: Positive for depression, hallucinations, substance abuse and suicidal ideas.  All other systems reviewed and are negative.  Blood pressure (!) 133/109, pulse (!) 122, temperature 98 F (36.7 C), temperature source Oral, resp. rate (!) 22, height 6\' 2"  (1.88 m), weight 90.7 kg, SpO2 100 %. Body mass index is 25.68 kg/m.  Treatment Plan Summary: Plan The patient remained under observation  overnight and will be reassessed in the a.m. to determine if he meets the criteria for psychiatric inpatient admission; he could be discharged back home.  Disposition: Supportive therapy provided about ongoing stressors. The patient remained under observation overnight and will be reassessed in the a.m. to determine if he meets the criteria for psychiatric inpatient admission; he could be discharged back home.  , NP 03/28/2021 11:31 PM

## 2021-03-29 DIAGNOSIS — F609 Personality disorder, unspecified: Secondary | ICD-10-CM

## 2021-03-29 DIAGNOSIS — U071 COVID-19: Secondary | ICD-10-CM

## 2021-03-29 HISTORY — DX: COVID-19: U07.1

## 2021-03-29 LAB — RESP PANEL BY RT-PCR (FLU A&B, COVID) ARPGX2
Influenza A by PCR: NEGATIVE
Influenza B by PCR: NEGATIVE
SARS Coronavirus 2 by RT PCR: POSITIVE — AB

## 2021-03-29 MED ORDER — ACETAMINOPHEN 500 MG PO TABS
1000.0000 mg | ORAL_TABLET | Freq: Once | ORAL | Status: AC
  Administered 2021-03-29: 1000 mg via ORAL
  Filled 2021-03-29: qty 2

## 2021-03-29 MED ORDER — BUSPIRONE HCL 5 MG PO TABS
10.0000 mg | ORAL_TABLET | Freq: Two times a day (BID) | ORAL | Status: DC
  Administered 2021-03-29 – 2021-03-31 (×4): 10 mg via ORAL
  Filled 2021-03-29 (×4): qty 2

## 2021-03-29 MED ORDER — FLUOXETINE HCL 20 MG PO CAPS
40.0000 mg | ORAL_CAPSULE | Freq: Every day | ORAL | Status: DC
  Administered 2021-03-30 – 2021-03-31 (×2): 40 mg via ORAL
  Filled 2021-03-29 (×2): qty 2

## 2021-03-29 NOTE — ED Notes (Signed)
Report given to [facility] RN. Pt transferred to [facility] by Shanda Bumps NT and security.

## 2021-03-29 NOTE — ED Notes (Signed)
Critical result of covid positive called from lab. Press photographer and EDP Ver[place]e notified.

## 2021-03-30 NOTE — ED Provider Notes (Addendum)
Emergency Medicine Observation Re-evaluation Note  Curtis Cobb is a 50 y.o. male, seen on rounds today.  Pt initially presented to the ED for complaints of Suicidal Currently, the patient is resting comfortably.  Physical Exam  BP 129/89 (BP Location: Left Arm)   Pulse 87   Temp 98.3 F (36.8 C) (Oral)   Resp 16   Ht 6\' 2"  (1.88 m)   Wt 90.7 kg   SpO2 98%   BMI 25.68 kg/m  Physical Exam General: Comfortable appearing. Cardiac: Good peripheral perfusion. Lungs: Normal respiratory effort. Psych: Calm and cooperative.  ED Course / MDM  EKG:   I have reviewed the labs performed to date as well as medications administered while in observation.  In the last 24 hours, the patient has been restarted on his home medications.  Plan  Current plan is for disposition per psychiatry team recommendations. Patient is not under IVC at this time.      , MD 03/30/21 [phone]

## 2021-03-30 NOTE — ED Notes (Signed)
Patient reports continues to feel SI, plan on hanging himself, reports he is having a tough time right now and just feeling exhausted. Reports compliant with medications at home. Patient did not want to elaborate on what is triggering these feelings of self harm. Patient reports hearing voices in his background, denies commands, states the voices are just there. Awaiting further plan of care. Calm and cooperative at present time. Will continue to monitor. Safety maintained.

## 2021-03-30 NOTE — BH Assessment (Signed)
TTS completed reassessment with Tenisha, TTS. Pt presents calm, alert, depressed, oriented x 5, low mood but cooperative. Pt reports today to be "rough" due to feeling depressed, restless and suicidal. Pt reported struggles with his current symptoms on/off for the last few years. Pt reports a recent suicidal attempt by slitting his neck and wrist a few weeks ago resulting to hopsitilaization. Pt reports an increase in his currents symptoms since his last hospitalization. Pt continues to endorse SI with no current plan, stating "I just don't want to live". Pt also continues to endorse AH of familiar voices without commands but denies any current HI/VH. Pt reports the AH to be exhausting and expressed the need for INPT.   Final disposition pending SOC

## 2021-03-30 NOTE — ED Notes (Signed)
Lunch provided.

## 2021-03-30 NOTE — Progress Notes (Signed)
Curtis Cobb is a 50 y.o. male with anxiety, depression, and alcohol abuse who comes in for SI. The patient states that he hear voices telling him to kill himself. The patient was re-assessed tonight, and he was restarted on his home medications. The patient disclosed he is on Buspar 10 mg BID and Prozac 40 mg daily. The patient voiced that he is suicidal and has been that way for ten years. He expressed that he wants to be started on medications that will work. I discussed that "most" psychiatric drugs take 3-6 weeks to work with the patient. He will have to find a psychiatric outpatient provider who can assist him in finding the best medications that would work for him.

## 2021-03-30 NOTE — ED Notes (Signed)
Pt given ice water per request.

## 2021-03-30 NOTE — ED Provider Notes (Signed)
Emergency Medicine Observation Re-evaluation Note  Curtis Cobb is a 50 y.o. male, seen on rounds today.  Pt initially presented to the ED for complaints of psychiatric evaluation.  Patient calm cooperative, no complaints.  Physical Exam  BP 132/87   Pulse (!) 104   Temp 99 F (37.2 C) (Oral)   Resp 15   Ht 6\' 2"  (1.88 m)   Wt 90.7 kg   SpO2 96%   BMI 25.68 kg/m    ED Course / MDM  Patient tested positive for Covid 03/29/2021. Plan  Current plan is for repeat evaluation tomorrow 03/31/2021 by psychiatry.  Anticipate likely discharge home.. Patient is not under full IVC at this time.   05/31/2021, MD 03/30/21 [id number]

## 2021-03-30 NOTE — ED Notes (Signed)
Dinner tray with drink given to pt.

## 2021-03-30 NOTE — ED Notes (Signed)
Vol called SOC for consult per Aquanetta TTS [id number]

## 2021-03-30 NOTE — Progress Notes (Signed)
CSW spoke with TTS. Patient is not psych cleared at this time. Unable to complete consult until final evaluation is completed.  

## 2021-03-30 NOTE — ED Notes (Signed)
TTS consult in progress. °

## 2021-03-31 DIAGNOSIS — F251 Schizoaffective disorder, depressive type: Secondary | ICD-10-CM

## 2021-03-31 LAB — LIPID PANEL
Cholesterol: 164 mg/dL (ref 0–200)
HDL: 28 mg/dL — ABNORMAL LOW (ref >40)
LDL Cholesterol: 89 mg/dL (ref 0–99)
Total CHOL/HDL Ratio: 5.9 RATIO
Triglycerides: 235 mg/dL — ABNORMAL HIGH (ref <150)
VLDL: 47 mg/dL — ABNORMAL HIGH (ref 0–40)

## 2021-03-31 LAB — HEMOGLOBIN A1C
Hgb A1c MFr Bld: 5.3 % (ref 4.8–5.6)
Mean Plasma Glucose: 105.41 mg/dL

## 2021-03-31 MED ORDER — ACETAMINOPHEN 500 MG PO TABS
1000.0000 mg | ORAL_TABLET | Freq: Once | ORAL | Status: AC
  Administered 2021-03-31: 1000 mg via ORAL
  Filled 2021-03-31: qty 2

## 2021-03-31 MED ORDER — DIVALPROEX SODIUM 250 MG PO DR TAB
250.0000 mg | DELAYED_RELEASE_TABLET | Freq: Two times a day (BID) | ORAL | Status: DC
  Administered 2021-03-31 – 2021-04-07 (×15): 250 mg via ORAL
  Filled 2021-03-31 (×16): qty 1

## 2021-03-31 MED ORDER — QUETIAPINE FUMARATE 25 MG PO TABS
100.0000 mg | ORAL_TABLET | Freq: Every day | ORAL | Status: DC
  Administered 2021-03-31 – 2021-04-06 (×7): 100 mg via ORAL
  Filled 2021-03-31 (×7): qty 4

## 2021-03-31 NOTE — ED Provider Notes (Signed)
Emergency Medicine Observation Re-evaluation Note  Curtis Cobb is a 50 y.o. male, seen on rounds today.  Pt initially presented to the ED for complaints of Suicidal Currently, the patient is sleeping.  Physical Exam  BP 132/87   Pulse (!) 104   Temp 99 F (37.2 C) (Oral)   Resp 15   Ht 6\' 2"  (1.88 m)   Wt 90.7 kg   SpO2 96%   BMI 25.68 kg/m  Physical Exam Gen: No acute distress  Resp: Normal rise and fall of chest Neuro: Moving all four extremities Psych: Resting currently, calm and cooperative when awake    ED Course / MDM  EKG:   I have reviewed the labs performed to date as well as medications administered while in observation.  Recent changes in the last 24 hours include no acute events overnight.  Plan  Current plan is for psychiatric evaluation for further disposition. Patient is not under full IVC at this time.   Layloni Fahrner, , DO 03/31/21 [phone]

## 2021-03-31 NOTE — Consult Note (Signed)
[facility] Face-to-Face Psychiatry Consult   Reason for Consult: Consult to follow-up for this 50 year old man with history of schizoaffective disorder came to the emergency room with suicidal ideation Referring Physician: Su Hoff Patient Identification: Curtis Cobb MRN:  [id number] Principal Diagnosis: Schizoaffective disorder, depressive type (HCC) Diagnosis:  Principal Problem:   Schizoaffective disorder, depressive type (HCC) Active Problems:   MDD (major depressive disorder), recurrent episode, severe (HCC)   Personality disorder cluster b   Tobacco use disorder   Total Time spent with patient: 1 hour  Subjective:   Curtis Cobb is a 50 y.o. male patient admitted with " I reached a breaking point".  HPI: Patient seen chart reviewed.  Patient with a history of schizoaffective disorder came to the emergency room reporting that mood has been much worse and having suicidal thoughts.  Reports that he had previously been stable and [place] but lost his job and went into a spell of bad depression.  Now homeless for the most part although recently staying in a motel.  Mood feels down and sad all the time.  Hears voices frequently for the last 6 days.  Having thoughts about stabbing himself or overdosing.  No homicidal ideation.  Says that he was taking BuSpar and Prozac that he had received from [place].  He is vague about whether he is still taking that.  Admits that he has had a little alcohol in the last week but claims to not be using any drugs.  Past Psychiatric History: Patient has a past history of schizoaffective disorder with possible personality features.  Has had several prior admissions and claims that the best ability he had was on antidepressants.  Also long history of abuse of alcohol and other drugs.  Risk to Self:   Risk to Others:   Prior Inpatient Therapy:   Prior Outpatient Therapy:    Past Medical History:  Past Medical History:  Diagnosis Date  . Alcohol use  disorder, moderate, dependence (HCC) 02/13/2014  . Anxiety   . Depression   . Hypertension   . Rheumatoid arthritis (HCC)   . Substance abuse [facility])     Past Surgical History:  Procedure Laterality Date  . INCISION AND DRAINAGE ABSCESS Right 08/25/2018   Procedure: INCISION AND DRAINAGE ABSCESS;  Surgeon: Christena Flake, MD;  Location: [facility] ORS;  Service: Orthopedics;  Laterality: Right;  . INCISION AND DRAINAGE ABSCESS Right 08/30/2018   Procedure: INCISION AND DRAINAGE ABSCESS;  Surgeon: Christena Flake, MD;  Location: [facility] ORS;  Service: Orthopedics;  Laterality: Right;  . none     Family History:  Family History  Problem Relation Age of Onset  . Heart attack Mother   . Stroke Mother   . Hypertension Mother   . Mental illness Mother        Manic Depressive Bipolar Disorder  . Heart disease Mother   . Hypertension Maternal Aunt   . Mental illness Maternal Aunt   . Hypertension Maternal Aunt   . Mental illness Maternal Aunt    Family Psychiatric  History: Reports severe mental illness and several family members especially his mother and an aunt and a grandfather Social History:  Social History   Substance and Sexual Activity  Alcohol Use Yes   Comment: beer 6 hours ago 03/28/2021     Social History   Substance and Sexual Activity  Drug Use No  . Types: Amphetamines, Marijuana, Cocaine, Heroin   Comment: Patient denies recent drug use.    Social History  Socioeconomic History  . Marital status: Single    Spouse name: Not on file  . Number of children: Not on file  . Years of education: Not on file  . Highest education level: Not on file  Occupational History  . Not on file  Tobacco Use  . Smoking status: Never Smoker  . Smokeless tobacco: Never Used  Substance and Sexual Activity  . Alcohol use: Yes    Comment: beer 6 hours ago 03/28/2021  . Drug use: No    Types: Amphetamines, Marijuana, Cocaine, Heroin    Comment: Patient denies recent drug use.  Marland Kitchen Sexual  activity: Yes    Birth control/protection: None  Other Topics Concern  . Not on file  Social History Narrative  . Not on file   Social Determinants of Health   Financial Resource Strain: Not on file  Food Insecurity: Not on file  Transportation Needs: Not on file  Physical Activity: Not on file  Stress: Not on file  Social Connections: Not on file   Additional Social History:    Allergies:   Allergies  Allergen Reactions  . Sulfa Antibiotics Other (See Comments)    Unknown reaction    Labs: No results found for this or any previous visit (from the past 48 hour(s)).  Current Facility-Administered Medications  Medication Dose Route Frequency Provider Last Rate Last Admin  . busPIRone (BUSPAR) tablet 10 mg  10 mg Oral BID Gillermo Murdoch, NP   10 mg at 03/31/21 0959  . FLUoxetine (PROZAC) capsule 40 mg  40 mg Oral Daily Gillermo Murdoch, NP   40 mg at 03/31/21 1000   Current Outpatient Medications  Medication Sig Dispense Refill  . amitriptyline (ELAVIL) 100 MG tablet Take 1 tablet (100 mg total) by mouth at bedtime. 30 tablet 1  . divalproex (DEPAKOTE) 500 MG DR tablet Take 1 tablet (500 mg total) by mouth every 12 (twelve) hours. 60 tablet 1  . FLUoxetine (PROZAC) 20 MG capsule Take 1 capsule (20 mg total) by mouth daily. 30 capsule 0  . levothyroxine (SYNTHROID, LEVOTHROID) 25 MCG tablet Take 1 tablet (25 mcg total) by mouth daily before breakfast. 30 tablet 1  . naltrexone (DEPADE) 50 MG tablet Take 1 tablet (50 mg total) by mouth daily. 14 tablet 0  . prazosin (MINIPRESS) 1 MG capsule Take 1 capsule (1 mg total) by mouth 2 (two) times daily. (Patient not taking: Reported on 11/12/2018) 60 capsule 1  . QUEtiapine (SEROQUEL) 200 MG tablet Take 1 tablet (200 mg total) by mouth at bedtime. (Patient taking differently: Take 200 mg by mouth at bedtime as needed (anxiety). ) 30 tablet 1    Musculoskeletal: Strength & Muscle Tone: flaccid Gait & Station:  normal Patient leans: N/A            Psychiatric Specialty Exam:  Presentation  General Appearance: Bizarre; Disheveled  Eye Contact:Fair  Speech:Clear and Coherent; Normal Rate  Speech Volume:Normal  Handedness:Right   Mood and Affect  Mood:Euthymic  Affect:Appropriate   Thought Process  Thought Processes:Coherent  Descriptions of Associations:Intact  Orientation:Full (Time, Place and Person)  Thought Content:Logical  History of Schizophrenia/Schizoaffective disorder:No  Duration of Psychotic Symptoms:Greater than six months  Hallucinations:No data recorded Ideas of Reference:None  Suicidal Thoughts:No data recorded Homicidal Thoughts:No data recorded  Sensorium  Memory:Immediate Good; Recent Good; Remote Good  Judgment:Good  Insight:Good   Executive Functions  Concentration:Good  Attention Span:Good  Recall:Good  Fund of Knowledge:Good  Language:Good   Psychomotor Activity  Psychomotor  Activity:No data recorded  Assets  Assets:Desire for Improvement; Housing; Resilience; Social Support   Sleep  Sleep:No data recorded  Physical Exam: Physical Exam Vitals and nursing note reviewed.  Constitutional:      Appearance: Normal appearance.  HENT:     Head: Normocephalic and atraumatic.     Mouth/Throat:     Pharynx: Oropharynx is clear.  Eyes:     Pupils: Pupils are equal, round, and reactive to light.  Cardiovascular:     Rate and Rhythm: Normal rate and regular rhythm.  Pulmonary:     Effort: Pulmonary effort is normal.     Breath sounds: Normal breath sounds.  Abdominal:     General: Abdomen is flat.     Palpations: Abdomen is soft.  Musculoskeletal:        General: Normal range of motion.  Skin:    General: Skin is warm and dry.  Neurological:     General: No focal deficit present.     Mental Status: He is alert. Mental status is at baseline.  Psychiatric:        Attention and Perception: Attention normal. He  perceives auditory hallucinations.        Mood and Affect: Mood is anxious and depressed.        Speech: Speech normal.        Behavior: Behavior is agitated. Behavior is not aggressive or hyperactive.        Thought Content: Thought content includes suicidal ideation. Thought content includes suicidal plan.        Cognition and Memory: Cognition is impaired.        Judgment: Judgment is impulsive.    Review of Systems  Constitutional: Negative.   HENT: Negative.   Eyes: Negative.   Respiratory: Negative.   Cardiovascular: Negative.   Gastrointestinal: Negative.   Musculoskeletal: Negative.   Skin: Negative.   Neurological: Negative.   Psychiatric/Behavioral: Positive for depression, hallucinations and suicidal ideas. The patient is nervous/anxious.    Blood pressure 137/83, pulse 87, temperature 98.9 F (37.2 C), temperature source Oral, resp. rate 16, height 6\' 2"  (1.88 m), weight 90.7 kg, SpO2 98 %. Body mass index is 25.68 kg/m.  Treatment Plan Summary: Plan Patient continues to endorse suicidal ideation.  His COVID test has come back positive although he is not symptomatic.  Will require extended quarantine before admission can be considered.  Medicines will be reviewed and we will try to find a medicine combination that might be of some benefit.  Labs reviewed.  Anything else missing will be ordered.  Disposition: Recommend psychiatric Inpatient admission when medically cleared.  , MD 03/31/2021 2:51 PM

## 2021-03-31 NOTE — ED Notes (Signed)
Patient sleeping, vital signs deferred. 

## 2021-03-31 NOTE — ED Notes (Signed)
Pt. Alert and oriented, warm and dry, in no distress. Pt. Denies HI, and AVH. Patient having SI with plan to hang self. Patient contracts for safety with this Clinical research associate. Pt. Encouraged to let nursing staff know of any concerns or needs.  ENVIRONMENTAL ASSESSMENT Potentially harmful objects out of patient reach: Yes.   Personal belongings secured: Yes.   Patient dressed in hospital provided attire only: Yes.   Plastic bags out of patient reach: Yes.   Patient care equipment (cords, cables, call bells, lines, and drains) shortened, removed, or accounted for: Yes.   Equipment and supplies removed from bottom of stretcher: Yes.   Potentially toxic materials out of patient reach: Yes.   Sharps container removed or out of patient reach: Yes.

## 2021-03-31 NOTE — ED Notes (Signed)
Pt given breakfast.

## 2021-03-31 NOTE — ED Notes (Signed)
Pt ate lunch.

## 2021-03-31 NOTE — TOC Progression Note (Addendum)
Transition of Care [facility]) - Progression Note    Patient Details  Name: Curtis Cobb MRN: [id number] Date of Birth: 1971/09/06  Transition of Care [facility]) CM/SW Contact  Marina Goodell Phone Number: [phone] 03/31/2021, 2:46 PM  Clinical Narrative:     CSW requested update from Dr. Toni Amend, psychiatrist.  Dr. Toni Amend stated the patient meets psychiatric inpatient criteria but due to his COVID positive status he would probably not able to find placement until he was medically cleared.       Expected Discharge Plan and Services                                                 Social Determinants of Health (SDOH) Interventions    Readmission Risk Interventions Readmission Risk Prevention Plan 09/01/2018  Post Dischage Appt Complete  Medication Screening Complete  Transportation Screening Complete  PCP follow-up Complete  Some recent data might be hidden

## 2021-04-01 ENCOUNTER — Encounter: Payer: Self-pay | Admitting: Emergency Medicine

## 2021-04-01 MED ORDER — ACETAMINOPHEN 500 MG PO TABS
1000.0000 mg | ORAL_TABLET | Freq: Once | ORAL | Status: AC
  Administered 2021-04-01: 1000 mg via ORAL
  Filled 2021-04-01: qty 2

## 2021-04-01 NOTE — ED Provider Notes (Signed)
Emergency Medicine Observation Re-evaluation Note  Curtis Cobb is a 50 y.o. male, seen on rounds today.  Pt initially presented to the ED for complaints of Suicidal Currently, the patient is resting.  Physical Exam  BP (!) 133/101 (BP Location: Left Arm)   Pulse 80   Temp 98 F (36.7 C) (Oral)   Resp 18   Ht 1.88 m (6\' 2" )   Wt 90.7 kg   SpO2 100%   BMI 25.68 kg/m  Physical Exam Gen:  No acute distress Resp:  Breathing easily and comfortably, no accessory muscle usage Neuro:  Moving all four extremities, no gross focal neuro deficits Psych:  Resting currently, calm when awake  ED Course / MDM  EKG:   I have reviewed the labs performed to date as well as medications administered while in observation.  Recent changes in the last 24 hours include no significant changes.  Plan  Current plan is for placement after period of COVID-19 quarantine is over. Patient is not under full IVC at this time.   , MD 04/01/21 [phone]

## 2021-04-01 NOTE — ED Notes (Signed)
MD Fuller Plan made aware of pt request for tylenol at this time. Per MD Fuller Plan, 1000mg  tylenol PO order at this time.

## 2021-04-01 NOTE — TOC Progression Note (Signed)
Transition of Care [facility]) - Progression Note    Patient Details  Name: Curtis Cobb MRN: [id number] Date of Birth: [birth year]  Transition of Care [facility]) CM/SW [place], [place] Phone Number: [phone] 04/01/2021, 9:05 AM  Clinical Narrative:     Patient meets inpatient psychiatric criteria and is pending inpatient placement after COVID+ quarantine time is met.  TOC will sign off.  Please place [facility] consult if needed.      Expected Discharge Plan and Services                                                 Social Determinants of Health (SDOH) Interventions    Readmission Risk Interventions Readmission Risk Prevention Plan 09/01/2018  Post Dischage Appt Complete  Medication Screening Complete  Transportation Screening Complete  PCP follow-up Complete  Some recent data might be hidden

## 2021-04-01 NOTE — ED Notes (Signed)
VOL/pending placement after quarantine period for Positive Covid test.

## 2021-04-01 NOTE — ED Notes (Signed)
IVC/pending placement after quarantine period for Positive Covid test.

## 2021-04-02 MED ORDER — ACETAMINOPHEN 500 MG PO TABS
1000.0000 mg | ORAL_TABLET | Freq: Once | ORAL | Status: AC
  Administered 2021-04-02: 1000 mg via ORAL
  Filled 2021-04-02: qty 2

## 2021-04-02 NOTE — ED Notes (Signed)
Pt complaining of knee pain. Pt requesting tylenol. EDP notified and order for po tylenol administered.

## 2021-04-02 NOTE — ED Notes (Signed)
VOL/Covid Quarantine period before Placement

## 2021-04-02 NOTE — ED Notes (Signed)
Gave pt food tray with juice. 

## 2021-04-02 NOTE — ED Notes (Signed)
Patient given lunch. Stable. NAD. No needs voiced or noted. Will continue to monitor.

## 2021-04-02 NOTE — ED Notes (Signed)
Bathing supplies given to patient at this time

## 2021-04-02 NOTE — ED Notes (Signed)
VOLUNTARY needs placement as unable to place at this time due to covid quarentine 

## 2021-04-03 MED ORDER — ACETAMINOPHEN 325 MG PO TABS
650.0000 mg | ORAL_TABLET | Freq: Four times a day (QID) | ORAL | Status: DC | PRN
  Administered 2021-04-03 – 2021-04-06 (×2): 650 mg via ORAL
  Filled 2021-04-03 (×2): qty 2

## 2021-04-03 NOTE — ED Notes (Signed)
Lunch tray given. No other needs found at this moment.  

## 2021-04-03 NOTE — ED Notes (Signed)
Hourly rounding completed at this time, patient currently awake in room. No complaints, stable, and in no acute distress. Q15 minute rounds and monitoring via Rover and Officer to continue. °

## 2021-04-03 NOTE — ED Notes (Signed)
Pt requests tylenol for aching in shoulders and neck. Rates 6/10. DR Larinda Buttery sent message via secure chat

## 2021-04-03 NOTE — ED Notes (Signed)
Pt reporst SI with plan to hang, states this is a safe place and he is not going to do it here, contracts for safety with this nurse.

## 2021-04-03 NOTE — ED Notes (Signed)
Breakfast tray given. VS assess. No other needs found at this moment.  

## 2021-04-03 NOTE — ED Notes (Signed)
ED Provider at bedside. 

## 2021-04-03 NOTE — ED Notes (Signed)
Unable to obtain vitals due to patient sleeping. Will continue to monitor.   

## 2021-04-03 NOTE — ED Notes (Signed)
Pt. To BHU from ED ambulatory without difficulty, to room  BHU 7. Report from [facility]. Pt. Is alert and oriented, warm and dry in no distress. Pt. Denies  HI, and AVH. Pt states having SI thoughts but no plan. Patient contracts for safety with this Clinical research associate. Pt. Calm and cooperative. Pt. Made aware of security cameras and Q15 minute rounds. Pt. Encouraged to let Nursing staff know of any concerns or needs.

## 2021-04-03 NOTE — ED Provider Notes (Signed)
Emergency Medicine Observation Re-evaluation Note  Curtis Cobb is a 50 y.o. male, seen on rounds today.  Pt initially presented to the ED for complaints of Suicidal Currently, the patient is resting comfortably.  Physical Exam  BP 133/80 (BP Location: Left Arm)   Pulse 97   Temp 98.3 F (36.8 C) (Oral)   Resp 16   Ht 6\' 2"  (1.88 m)   Wt 90.7 kg   SpO2 100%   BMI 25.68 kg/m  Physical Exam Gen: No acute distress  Resp: Normal rise and fall of chest Neuro: Moving all four extremities Psych: Resting currently, calm and cooperative when awake    ED Course / MDM  EKG:EKG Interpretation  Date/Time:  Monday March 31 2021 15:17:35 EDT Ventricular Rate:  84 PR Interval:  144 QRS Duration: 94 QT Interval:  362 QTC Calculation: 427 R Axis:   51 Text Interpretation: Normal sinus rhythm Nonspecific ST abnormality Abnormal ECG Confirmed by UNCONFIRMED, DOCTOR (12-21-1967), editor [id number], Tammy [phone]) on 04/01/2021 7:52:14 AM   I have reviewed the labs performed to date as well as medications administered while in observation.  Recent changes in the last 24 hours include no acute events overnight.  Plan  Current plan is for psychiatric placement once he is out of Covid quarantine.  Tested positive for COVID-19 on 03/29/2021. Patient is not under full IVC at this time.   Kali Deadwyler, 05/29/2021, DO 04/03/21 [phone]

## 2021-04-03 NOTE — ED Notes (Signed)
Pt transferred to [facility], report to [employer], Charity fundraiser.

## 2021-04-03 NOTE — ED Notes (Signed)
Report received from [place], RN including Situation, Background, Assessment, and Recommendations. Patient alert and oriented, warm and dry, and in no acute distress. Patient denies HI, VH and pain. Patient made aware of Q15 minute rounds and Psychologist, counselling presence for their safety. Patient instructed to come to this nurse with needs or concerns.

## 2021-04-03 NOTE — ED Notes (Signed)
VOLUNTARY needs placement as unable to place at this time due to covid quarentine

## 2021-04-03 NOTE — ED Notes (Signed)
Pt is asleep at this moment. VS will be assess when pt is awake.

## 2021-04-04 NOTE — ED Notes (Signed)
Pt ate all of breakfast.  

## 2021-04-04 NOTE — ED Notes (Signed)
Given lunch

## 2021-04-04 NOTE — ED Provider Notes (Signed)
Emergency Medicine Observation Re-evaluation Note  Curtis Cobb is a 50 y.o. male, seen on rounds today.  Pt initially presented to the ED for complaints of Suicidal Currently, the patient is resting.  Physical Exam  BP 131/84 (BP Location: Left Arm)   Pulse 98   Temp 98.2 F (36.8 C) (Oral)   Resp 20   Ht 6\' 2"  (1.88 m)   Wt 90.7 kg   SpO2 98%   BMI 25.68 kg/m  Physical Exam General: NAD Cardiac: well perfused Lungs: even and unlabored Psych: calm  ED Course / MDM  EKG:EKG Interpretation  Date/Time:  Monday March 31 2021 15:17:35 EDT Ventricular Rate:  84 PR Interval:  144 QRS Duration: 94 QT Interval:  362 QTC Calculation: 427 R Axis:   51 Text Interpretation: Normal sinus rhythm Nonspecific ST abnormality Abnormal ECG Confirmed by UNCONFIRMED, DOCTOR (12-21-1967), editor [id number], Tammy [phone]) on 04/01/2021 7:52:14 AM   I have reviewed the labs performed to date as well as medications administered while in observation.  Recent changes in the last 24 hours include none.  Plan  Current plan is for psych placement pending covid quarantine.  + test 03/29/21 Patient is not under full IVC at this time.   05/29/21, MD 04/04/21 [phone]

## 2021-04-04 NOTE — ED Notes (Signed)
Given  breakfast

## 2021-04-04 NOTE — ED Notes (Signed)
Unable to obtain vitals due to patient sleeping. Will continue to monitor.   

## 2021-04-04 NOTE — ED Notes (Signed)
Up to bathroom

## 2021-04-05 NOTE — ED Provider Notes (Signed)
Emergency Medicine Observation Re-evaluation Note  Garison Genova is a 50 y.o. male, seen on rounds today.  Pt initially presented to the ED for complaints of Suicidal Currently, the patient is resting, voices no medical complaint.  Physical Exam  BP 116/74 (BP Location: Left Arm)   Pulse 93   Temp 98.8 F (37.1 C) (Oral)   Resp 20   Ht 6\' 2"  (1.88 m)   Wt 90.7 kg   SpO2 99%   BMI 25.68 kg/m  Physical Exam General: Resting in no acute distress Cardiac: No cyanosis Lungs: Equal rise and fall Psych: Not agitated  ED Course / MDM  EKG:EKG Interpretation  Date/Time:  Monday March 31 2021 15:17:35 EDT Ventricular Rate:  84 PR Interval:  144 QRS Duration: 94 QT Interval:  362 QTC Calculation: 427 R Axis:   51 Text Interpretation: Normal sinus rhythm Nonspecific ST abnormality Abnormal ECG Confirmed by UNCONFIRMED, DOCTOR (12-21-1967), editor [id number], Tammy [phone]) on 04/01/2021 7:52:14 AM   I have reviewed the labs performed to date as well as medications administered while in observation.  Recent changes in the last 24 hours include no events overnight.  Plan  Current plan is for psychiatric placement pending Covid quarantine. Patient is not under full IVC at this time.   06/01/2021, MD 04/05/21 [phone]

## 2021-04-05 NOTE — ED Notes (Signed)
Pt given breakfast tray

## 2021-04-05 NOTE — ED Notes (Signed)
Pt given dinner tray.

## 2021-04-05 NOTE — ED Notes (Signed)
No temp obtained at this time d/t pt drinking iced drink.

## 2021-04-05 NOTE — ED Notes (Signed)
Pt given shasta lime with ice at this time per request. No other needs voiced.

## 2021-04-05 NOTE — ED Notes (Signed)
Vol, pending placement after quarantine period from +Covid

## 2021-04-05 NOTE — ED Notes (Signed)
Pt up to restroom and back to room

## 2021-04-06 NOTE — ED Notes (Signed)
Meal tray at bedside.  

## 2021-04-06 NOTE — ED Notes (Signed)
Pt. Was given his lunch tray and a sprite to drink.

## 2021-04-06 NOTE — ED Notes (Signed)
Pt was unaware he could shower. Tech offered shower to pt and he gladly accepted. Pt ate breakfast, changed his bed linen, showered and got new scrub set.  Pt very calm and appreciative.

## 2021-04-06 NOTE — ED Notes (Signed)
Pt denies complaints at this time. In room with lights dimmed. Calm and cooperative at this time.

## 2021-04-06 NOTE — ED Notes (Signed)
Pt taking shower per request.

## 2021-04-06 NOTE — ED Provider Notes (Signed)
Emergency Medicine Observation Re-evaluation Note  Muhammadali Ries is a 50 y.o. male, seen on rounds today.  Pt initially presented to the ED for complaints of Suicidal  Currently, the patient is is no acute distress. Denies any concerns at this time  Physical Exam  Blood pressure 120/80, pulse 100, temperature 98.8 F (37.1 C), temperature source Oral, resp. rate 18, height 6\' 2"  (1.88 m), weight 90.7 kg, SpO2 100 %.  Physical Exam General: No apparent distress HEENT: moist mucous membranes CV: RRR Pulm: Normal WOB GI: soft and non tender MSK: no edema or cyanosis Neuro: face symmetric, moving all extremities     ED Course / MDM     I have reviewed the labs performed to date as well as medications administered while in observation.  Recent changes in the last 24 hours include none   Plan   Current plan is to continue to wait for psych plan/placement if felt warranted  Patient is not under full IVC at this time. COVID + so waiting quarentine period for placement    , MD 04/06/21 0221

## 2021-04-07 LAB — POC SARS CORONAVIRUS 2 AG -  ED: SARS Coronavirus 2 Ag: NEGATIVE

## 2021-04-07 MED ORDER — DIVALPROEX SODIUM 500 MG PO DR TAB: 500.0000 mg | DELAYED_RELEASE_TABLET | Freq: Two times a day (BID) | ORAL | Status: DC

## 2021-04-07 MED ORDER — QUETIAPINE FUMARATE 200 MG PO TABS: 200.0000 mg | ORAL_TABLET | Freq: Every day | ORAL | 1 refills | Status: DC

## 2021-04-07 MED ORDER — DIVALPROEX SODIUM 500 MG PO DR TAB: 500.0000 mg | DELAYED_RELEASE_TABLET | Freq: Two times a day (BID) | ORAL | 1 refills | Status: DC

## 2021-04-07 MED ORDER — QUETIAPINE FUMARATE 200 MG PO TABS: 200.0000 mg | ORAL_TABLET | Freq: Every day | ORAL | Status: DC

## 2021-04-07 NOTE — ED Notes (Signed)
VOL, pending placement after quarantine period from + Covid test

## 2021-04-07 NOTE — ED Provider Notes (Signed)
Patient has a room at [facility] and is suitable for outpatient management.  Return precautions for the ED were discussed.   Delton Pra[place], MD 04/07/21 1719

## 2021-04-07 NOTE — ED Notes (Signed)
Pt discharged to [facility] via cab voucher.  VS stable. All belongings returned to patient. Pt denies SI.

## 2021-04-07 NOTE — ED Notes (Signed)
Pt will discharge via cab to [facility].

## 2021-04-07 NOTE — Consult Note (Signed)
[facility] Face-to-Face Psychiatry Consult   Reason for Consult: Consult for 50 year old man with a history of psychiatric symptoms and substance abuse now recovered from [place] Referring Physician: Synechiae Patient Identification: Curtis Cobb MRN:  [id number] Principal Diagnosis: Schizoaffective disorder, depressive type ([place]) Diagnosis:  Principal Problem:   Schizoaffective disorder, depressive type ([place]) Active Problems:   MDD (major depressive disorder), recurrent episode, severe ([place])   Personality disorder cluster b   Tobacco use disorder   Total Time spent with patient: 30 minutes  Subjective:   Curtis Cobb is a 50 y.o. male patient admitted with "I guess I am feeling better".  HPI: Patient has been in the emergency room for many days now recovering from [place].  He is now COVID negative.  Patient reports his mood is feeling better.  Less depressed and less anxious.  Not having acute psychotic symptoms.  [place] better self-care better insight.  Denies any suicidal or homicidal thought.  Cooperative with medicine.  Feels that the medicine has been helpful but could be somewhat stronger.  Past Psychiatric History: Long history of substance abuse and mental health problems multiple hospitalizations  Risk to Self:   Risk to Others:   Prior Inpatient Therapy:   Prior Outpatient Therapy:    Past Medical History:  Past Medical History:  Diagnosis Date  . Alcohol use disorder, moderate, dependence ([place]) 02/13/2014  . Anxiety   . COVID-19 03/29/2021   Diagnosed in [facility] ED on 03/29/2021  . Depression   . Hypertension   . Rheumatoid arthritis ([place])   . Substance abuse [facility])     Past Surgical History:  Procedure Laterality Date  . INCISION AND DRAINAGE ABSCESS Right 08/25/2018   Procedure: INCISION AND DRAINAGE ABSCESS;  Surgeon: Corky Mull, MD;  Location: [facility] ORS;  Service: Orthopedics;  Laterality: Right;  . INCISION AND DRAINAGE ABSCESS Right 08/30/2018    Procedure: INCISION AND DRAINAGE ABSCESS;  Surgeon: Corky Mull, MD;  Location: [facility] ORS;  Service: Orthopedics;  Laterality: Right;  . none     Family History:  Family History  Problem Relation Age of Onset  . Heart attack Mother   . Stroke Mother   . Hypertension Mother   . Mental illness Mother        Manic Depressive Bipolar Disorder  . Heart disease Mother   . Hypertension Maternal Aunt   . Mental illness Maternal Aunt   . Hypertension Maternal Aunt   . Mental illness Maternal Aunt    Family Psychiatric  History: See previous Social History:  Social History   Substance and Sexual Activity  Alcohol Use Yes   Comment: beer 6 hours ago 03/28/2021     Social History   Substance and Sexual Activity  Drug Use No  . Types: Amphetamines, Marijuana, Cocaine, Heroin   Comment: Patient denies recent drug use.    Social History   Socioeconomic History  . Marital status: Single    Spouse name: Not on file  . Number of children: Not on file  . Years of education: Not on file  . Highest education level: Not on file  Occupational History  . Not on file  Tobacco Use  . Smoking status: Never Smoker  . Smokeless tobacco: Never Used  Substance and Sexual Activity  . Alcohol use: Yes    Comment: beer 6 hours ago 03/28/2021  . Drug use: No    Types: Amphetamines, Marijuana, Cocaine, Heroin    Comment: Patient denies recent drug use.  Marland Kitchen  Sexual activity: Yes    Birth control/protection: None  Other Topics Concern  . Not on file  Social History Narrative  . Not on file   Social Determinants of Health   Financial Resource Strain: Not on file  Food Insecurity: Not on file  Transportation Needs: Not on file  Physical Activity: Not on file  Stress: Not on file  Social Connections: Not on file   Additional Social History:    Allergies:   Allergies  Allergen Reactions  . Sulfa Antibiotics Other (See Comments)    Unknown reaction    Labs:  Results for orders placed or  performed during the hospital encounter of 03/28/21 (from the past 48 hour(s))  POC SARS Coronavirus 2 Ag-ED - Nasal Swab     Status: None   Collection Time: 04/07/21  8:16 AM  Result Value Ref Range   SARS Coronavirus 2 Ag NEGATIVE NEGATIVE    Comment: (NOTE) SARS-CoV-2 antigen NOT DETECTED.   Negative results are presumptive.  Negative results do not preclude SARS-CoV-2 infection and should not be used as the sole basis for treatment or other patient management decisions, including infection  control decisions, particularly in the presence of clinical signs and  symptoms consistent with COVID-19, or in those who have been in contact with the virus.  Negative results must be combined with clinical observations, patient history, and epidemiological information. The expected result is Negative.  Fact Sheet for Patients: HandmadeRecipes.com.cy  Fact Sheet for Healthcare Providers: FuneralLife.at  This test is not yet approved or cleared by the [place] FDA and  has been authorized for detection and/or diagnosis of SARS-CoV-2 by FDA under an Emergency Use Authorization (EUA).  This EUA will remain in effect (meaning this test can be used) for the duration of  the COV ID-19 declaration under Section 564(b)(1) of the Act, 21 U.S.C. section 360bbb-3(b)(1), unless the authorization is terminated or revoked sooner.      Current Facility-Administered Medications  Medication Dose Route Frequency Provider Last Rate Last Admin  . acetaminophen (TYLENOL) tablet 650 mg  650 mg Oral Q6H PRN Blake Divine, MD   650 mg at 04/06/21 1446  . divalproex (DEPAKOTE) DR tablet 500 mg  500 mg Oral Q12H Oziel Beitler T, MD      . QUEtiapine (SEROQUEL) tablet 200 mg  200 mg Oral QHS Aundria Bitterman, Madie Reno, MD       Current Outpatient Medications  Medication Sig Dispense Refill  . amitriptyline (ELAVIL) 100 MG tablet Take 1 tablet (100 mg total) by mouth at  bedtime. 30 tablet 1  . busPIRone (BUSPAR) 10 MG tablet Take 10 mg by mouth 2 (two) times daily.    . divalproex (DEPAKOTE) 500 MG DR tablet Take 1 tablet (500 mg total) by mouth every 12 (twelve) hours. 60 tablet 1  . FLUoxetine (PROZAC) 20 MG capsule Take 1 capsule (20 mg total) by mouth daily. 30 capsule 0  . FLUoxetine (PROZAC) 40 MG capsule Take 40 mg by mouth daily.    Marland Kitchen levothyroxine (SYNTHROID, LEVOTHROID) 25 MCG tablet Take 1 tablet (25 mcg total) by mouth daily before breakfast. 30 tablet 1  . naltrexone (DEPADE) 50 MG tablet Take 1 tablet (50 mg total) by mouth daily. 14 tablet 0  . prazosin (MINIPRESS) 1 MG capsule Take 1 capsule (1 mg total) by mouth 2 (two) times daily. (Patient not taking: No sig reported) 60 capsule 1  . QUEtiapine (SEROQUEL) 200 MG tablet Take 1 tablet (200 mg total) by  mouth at bedtime. (Patient taking differently: Take 200 mg by mouth at bedtime as needed (anxiety).) 30 tablet 1    Musculoskeletal: Strength & Muscle Tone: within normal limits Gait & Station: normal Patient leans: N/A            Psychiatric Specialty Exam:  Presentation  General Appearance: Bizarre; Disheveled  Eye Contact:Fair  Speech:Clear and Coherent; Normal Rate  Speech Volume:Normal  Handedness:Right   Mood and Affect  Mood:Euthymic  Affect:Appropriate   Thought Process  Thought Processes:Coherent  Descriptions of Associations:Intact  Orientation:Full (Time, Place and Person)  Thought Content:Logical  History of Schizophrenia/Schizoaffective disorder:No  Duration of Psychotic Symptoms:Greater than six months  Hallucinations:No data recorded Ideas of Reference:None  Suicidal Thoughts:No data recorded Homicidal Thoughts:No data recorded  Sensorium  Memory:Immediate Good; Recent Good; Remote Good  Judgment:Good  Insight:Good   Executive Functions  Concentration:Good  Attention Span:Good  Recall:Good  Fund of  Knowledge:Good  Language:Good   Psychomotor Activity  Psychomotor Activity:No data recorded  Assets  Assets:Desire for Improvement; Housing; Resilience; Social Support   Sleep  Sleep:No data recorded  Physical Exam: Physical Exam Constitutional:      Appearance: Normal appearance.  HENT:     Head: Normocephalic and atraumatic.     Mouth/Throat:     Pharynx: Oropharynx is clear.  Eyes:     Pupils: Pupils are equal, round, and reactive to light.  Cardiovascular:     Rate and Rhythm: Normal rate and regular rhythm.  Pulmonary:     Effort: Pulmonary effort is normal.     Breath sounds: Normal breath sounds.  Abdominal:     General: Abdomen is flat.     Palpations: Abdomen is soft.  Musculoskeletal:        General: Normal range of motion.  Skin:    General: Skin is warm and dry.  Neurological:     General: No focal deficit present.     Mental Status: He is alert. Mental status is at baseline.  Psychiatric:        Mood and Affect: Mood normal.        Thought Content: Thought content normal.    Review of Systems  Constitutional: Negative.   HENT: Negative.   Eyes: Negative.   Respiratory: Negative.   Cardiovascular: Negative.   Gastrointestinal: Negative.   Musculoskeletal: Negative.   Skin: Negative.   Neurological: Negative.   Psychiatric/Behavioral: Negative.    Blood pressure 116/86, pulse 95, temperature 98.7 F (37.1 C), temperature source Oral, resp. rate 17, height 6\' 2"  (1.88 m), weight 90.7 kg, SpO2 99 %. Body mass index is 25.68 kg/m.  Treatment Plan Summary: Plan Patient at this point does not meet commitment criteria and does not meet criteria for inpatient hospitalization.  Reviewed medication which was started at a low dose.  Increase Depakote to 500 twice a day and Seroquel to 200 mg at night.  Patient has met with the [facility] liaison.  He certainly would be eligible to follow-up with them if he stays in [facility].  Right now he is homeless and  may need to make arrangements to find a safe place to live.  Case reviewed with emergency room physician and social work.  Suggest [facility].  Prescriptions will be provided at discharge.  Disposition: No evidence of imminent risk to self or others at present.   Patient does not meet criteria for psychiatric inpatient admission. Supportive therapy provided about ongoing stressors.  [facility], MD 04/07/2021 12:19  PM

## 2021-04-07 NOTE — TOC Progression Note (Signed)
Transition of Care [facility]) - Progression Note    Patient Details  Name: Curtis Cobb MRN: [id number] Date of Birth: 01-Jul-1971  Transition of Care [facility]) CM/SW Contact  Curtis Cobb Phone Number:  [phone] 04/07/2021, 3:29 PM  Clinical Narrative:      Curtis Cobb has been psych cleared.  Patient is currently homeless and has requested assitance with placement.  CSW contacted Curtis Cobb and spoke with Curtis Cobb who stated the patient needs to call him for an assessment.  Csw gave patient contact information for Curtis Cobb for assessment.       Expected Discharge Plan and Services                                                 Social Determinants of Health (SDOH) Interventions    Readmission Risk Interventions Readmission Risk Prevention Plan 09/01/2018  Post Dischage Appt Complete  Medication Screening Complete  Transportation Screening Complete  PCP follow-up Complete  Some recent data might be hidden

## 2021-04-07 NOTE — ED Provider Notes (Signed)
Emergency Medicine Observation Re-evaluation Note  Curtis Cobb is a 50 y.o. male, seen on rounds today.  Pt initially presented to the ED for complaints of Suicidal Currently, the patient is resting, voices no medical complaints.  Physical Exam  BP 113/80 (BP Location: Left Arm)   Pulse 96   Temp 98.4 F (36.9 C) (Oral)   Resp 16   Ht 6\' 2"  (1.88 m)   Wt 90.7 kg   SpO2 98%   BMI 25.68 kg/m  Physical Exam General: Resting in no acute distress Cardiac: No cyanosis Lungs: Equal rise and fall Psych: Not agitated  ED Course / MDM  EKG:EKG Interpretation  Date/Time:  Monday March 31 2021 15:17:35 EDT Ventricular Rate:  84 PR Interval:  144 QRS Duration: 94 QT Interval:  362 QTC Calculation: 427 R Axis:   51 Text Interpretation: Normal sinus rhythm Nonspecific ST abnormality Abnormal ECG Confirmed by UNCONFIRMED, DOCTOR (12-21-1967), editor [id number], Tammy [phone]) on 04/01/2021 7:52:14 AM   I have reviewed the labs performed to date as well as medications administered while in observation.  Recent changes in the last 24 hours include no events overnight.  Plan  Current plan is for psychiatric placement after Covid quarantine.  Patient tested positive 03/29/2021.  According to CDC guidelines, after 5 days of quarantine patient may have antigen test be cleared if negative. Patient is not under full IVC at this time.   05/29/2021, MD 04/07/21 [phone]

## 2021-04-07 NOTE — ED Notes (Signed)
Pt given contact information and phone number to call [facility].

## 2021-05-12 ENCOUNTER — Other Ambulatory Visit: Payer: Self-pay

## 2021-05-12 ENCOUNTER — Emergency Department
Admission: EM | Admit: 2021-05-12 | Discharge: 2021-05-12 | Disposition: A | Discharge status: Home / Self Care | Payer: Self-pay | Attending: Emergency Medicine | Admitting: Emergency Medicine

## 2021-05-12 ENCOUNTER — Encounter: Payer: Self-pay | Admitting: Emergency Medicine

## 2021-05-12 DIAGNOSIS — Z79899 Other long term (current) drug therapy: Secondary | ICD-10-CM | POA: Insufficient documentation

## 2021-05-12 DIAGNOSIS — M25561 Pain in right knee: Secondary | ICD-10-CM | POA: Insufficient documentation

## 2021-05-12 DIAGNOSIS — M069 Rheumatoid arthritis, unspecified: Secondary | ICD-10-CM | POA: Insufficient documentation

## 2021-05-12 DIAGNOSIS — I10 Essential (primary) hypertension: Secondary | ICD-10-CM | POA: Insufficient documentation

## 2021-05-12 DIAGNOSIS — M25512 Pain in left shoulder: Secondary | ICD-10-CM | POA: Insufficient documentation

## 2021-05-12 DIAGNOSIS — M25562 Pain in left knee: Secondary | ICD-10-CM | POA: Insufficient documentation

## 2021-05-12 DIAGNOSIS — E039 Hypothyroidism, unspecified: Secondary | ICD-10-CM | POA: Insufficient documentation

## 2021-05-12 DIAGNOSIS — Z8616 Personal history of COVID-19: Secondary | ICD-10-CM | POA: Insufficient documentation

## 2021-05-12 MED ORDER — KETOROLAC TROMETHAMINE 30 MG/ML IJ SOLN
30.0000 mg | Freq: Once | INTRAMUSCULAR | Status: AC
  Administered 2021-05-12: 30 mg via INTRAMUSCULAR
  Filled 2021-05-12: qty 1

## 2021-05-12 MED ORDER — PREDNISONE 10 MG (21) PO TBPK: ORAL_TABLET | ORAL | 0 refills | Status: AC

## 2021-05-12 MED ORDER — DEXAMETHASONE SODIUM PHOSPHATE 10 MG/ML IJ SOLN
10.0000 mg | Freq: Once | INTRAMUSCULAR | Status: AC
  Administered 2021-05-12: 10 mg via INTRAMUSCULAR
  Filled 2021-05-12: qty 1

## 2021-05-12 MED ORDER — ACETAMINOPHEN 500 MG PO TABS
1000.0000 mg | ORAL_TABLET | Freq: Once | ORAL | Status: AC
  Administered 2021-05-12: 1000 mg via ORAL
  Filled 2021-05-12: qty 2

## 2021-05-12 NOTE — ED Provider Notes (Signed)
[facility] Emergency Department Provider Note ____________________________________________   Event Date/Time   First MD Initiated Contact with Patient 05/12/21 1326     (approximate)  I have reviewed the triage vital signs and the nursing notes.  HISTORY  Chief Complaint Pain   HPI Curtis Cobb is a 50 y.o. malewho presents to the ED for evaluation of pain flair  Chart review indicates history of anxiety and alcoholism.  History of RA. Patient reports not being on a controller medication for his RA.  Patient reports acute on chronic pain to multiple joints over the past 3 days.  Denies any falls, trauma or injury.  Reports pain to his bilateral shoulders, bilateral wrists and bilateral knees.  Reports taking Tylenol and ibuprofen at home with minimal relief.  Denies fevers, syncope, falls, Back pain, chest pain, abdominal pain.   Past Medical History:  Diagnosis Date  . Alcohol use disorder, moderate, dependence ([facility]) 02/13/2014  . Anxiety   . COVID-19 03/29/2021   Diagnosed in [facility] ED on 03/29/2021  . Depression   . Hypertension   . Rheumatoid arthritis ([facility])   . Substance abuse [facility])     Patient Active Problem List   Diagnosis Date Noted  . Hypothyroidism 10/17/2018  . Tobacco use disorder 10/09/2018  . Cellulitis 08/23/2018  . 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 MDD (major depressive disorder), recurrent episode, severe ([facility]) 11/17/2015  . Opioid use disorder, mild, in sustained remission (HCC) 10/08/2015  . Alcohol use disorder, severe, dependence (HCC) 10/07/2015  . Cannabis use disorder, moderate, dependence (HCC) 10/07/2015  . HTN (hypertension) 10/07/2015  . Rheumatoid arthritis (HCC) 10/07/2015  . Schizoaffective disorder, depressive type [facility])     Past Surgical History:  Procedure Laterality Date  . INCISION AND DRAINAGE  ABSCESS Right 08/25/2018   Procedure: INCISION AND DRAINAGE ABSCESS;  Surgeon: Christena Flake, MD;  Location: [facility] ORS;  Service: Orthopedics;  Laterality: Right;  . INCISION AND DRAINAGE ABSCESS Right 08/30/2018   Procedure: INCISION AND DRAINAGE ABSCESS;  Surgeon: Christena Flake, MD;  Location: [facility] ORS;  Service: Orthopedics;  Laterality: Right;  . none      Prior to Admission medications   Medication Sig Start Date End Date Taking? Authorizing Provider  predniSONE (STERAPRED UNI-PAK 21 TAB) 10 MG (21) TBPK tablet Take 5 tablets (50 mg total) by mouth daily for 6 days, THEN 4 tablets (40 mg total) daily for 2 days, THEN 3 tablets (30 mg total) daily for 2 days, THEN 2 tablets (20 mg total) daily for 2 days, THEN 1 tablet (10 mg total) daily for 2 days. 05/12/21 05/26/21 Yes Delton Prairie, MD  amitriptyline (ELAVIL) 100 MG tablet Take 1 tablet (100 mg total) by mouth at bedtime. 10/17/18   Pucilowska, Jolanta B, MD  busPIRone (BUSPAR) 10 MG tablet Take 10 mg by mouth 2 (two) times daily. 03/18/21   [provider]  divalproex (DEPAKOTE) 500 MG DR tablet Take 1 tablet (500 mg total) by mouth every 12 (twelve) hours. 04/07/21   Clapacs, Jackquline Denmark, MD  FLUoxetine (PROZAC) 20 MG capsule Take 1 capsule (20 mg total) by mouth daily. 01/29/19 01/29/20  Myrna Blazer, MD  FLUoxetine (PROZAC) 40 MG capsule Take 40 mg by mouth daily. 03/18/21   [provider]  levothyroxine (SYNTHROID, LEVOTHROID) 25 MCG tablet Take 1 tablet (25 mcg total) by mouth daily before breakfast. 10/18/18  Pucilowska, Jolanta B, MD  naltrexone (DEPADE) 50 MG tablet Take 1 tablet (50 mg total) by mouth daily. 11/13/18   Governor Rooks, MD  prazosin (MINIPRESS) 1 MG capsule Take 1 capsule (1 mg total) by mouth 2 (two) times daily. Patient not taking: No sig reported 10/17/18   Pucilowska, Jolanta B, MD  QUEtiapine (SEROQUEL) 200 MG tablet Take 1 tablet (200 mg total) by mouth at bedtime. 04/07/21   Clapacs, Jackquline Denmark, MD     Allergies Sulfa antibiotics  Family History  Problem Relation Age of Onset  . Heart attack Mother   . Stroke Mother   . Hypertension Mother   . Mental illness Mother        Manic Depressive Bipolar Disorder  . Heart disease Mother   . Hypertension Maternal Aunt   . Mental illness Maternal Aunt   . Hypertension Maternal Aunt   . Mental illness Maternal Aunt     Social History Social History   Tobacco Use  . Smoking status: Never Smoker  . Smokeless tobacco: Never Used  Substance Use Topics  . Alcohol use: Yes    Comment: beer 6 hours ago 03/28/2021  . Drug use: No    Types: Amphetamines, Marijuana, Cocaine, Heroin    Comment: Patient denies recent drug use.    Review of Systems  Constitutional: No fever/chills Eyes: No visual changes. ENT: No sore throat. Cardiovascular: Denies chest pain. Respiratory: Denies shortness of breath. Gastrointestinal: No abdominal pain.  No nausea, no vomiting.  No diarrhea.  No constipation. Genitourinary: Negative for dysuria. Musculoskeletal: Positive for atraumatic bilateral shoulder, wrist and knee pain Skin: Negative for rash. Neurological: Negative for headaches, focal weakness or numbness.  ____________________________________________   PHYSICAL EXAM:  VITAL SIGNS: Vitals:   05/12/21 1205  BP: (!) 151/91  Pulse: 90  Resp: 16  Temp: 98 F (36.7 C)  SpO2: 99%     Constitutional: Alert and oriented.  Appears uncomfortable sitting up in the bedside chair, conversational in full sentences. Eyes: Conjunctivae are normal. PERRL. EOMI. Head: Atraumatic. Nose: No congestion/rhinnorhea. Mouth/Throat: Mucous membranes are moist.  Oropharynx non-erythematous. Neck: No stridor. No cervical spine tenderness to palpation. Cardiovascular: Normal rate, regular rhythm. Grossly normal heart sounds.  Good peripheral circulation. Respiratory: Normal respiratory effort.  No retractions. Lungs CTAB. Gastrointestinal: Soft ,  nondistended, nontender to palpation. No CVA tenderness. Musculoskeletal:. No signs of acute trauma. Mild deformities to bilateral fingers and associated stiffness. Tenderness diffusely throughout his bilateral wrists, shoulders and knees without evidence of laceration, trauma or open injury. Neurologic:  Normal speech and language. No gross focal neurologic deficits are appreciated. Cranial nerves II through XII intact 5/5 strength and sensation in all 4 extremities Skin:  Skin is warm, dry and intact. No rash noted. Psychiatric: Mood and affect are normal. Speech and behavior are normal.  _________________________   PROCEDURES and INTERVENTIONS  Procedure(s) performed (including Critical Care):  Procedures  Medications  acetaminophen (TYLENOL) tablet 1,000 mg (1,000 mg Oral Given 05/12/21 1404)  ketorolac (TORADOL) 30 MG/ML injection 30 mg (30 mg Intramuscular Given 05/12/21 1405)  dexamethasone (DECADRON) injection 10 mg (10 mg Intramuscular Given 05/12/21 1405)    ____________________________________________   MDM / ED COURSE   50 year old male with history of RA presents to the ED with RA flare, amenable to outpatient management with rheumatology follow-up with addition of a steroid taper to his regimen.  Exam without evidence of trauma, neurologic or vascular deficits.  His pain is consistent with previous flares and  I see no evidence of additional acute pathology.  Provided steroids, NSAIDs and Tylenol with significant improvement of his symptoms.  Prescribed prednisone taper over the next 2 weeks and provided follow-up information for rheumatology.  Return precautions for the ED were discussed.  Clinical Course as of 05/12/21 1513  Mon May 12, 2021  1509 Reassessed.  Patient reports significantly improved pain.  Clinically looks better as well.  We discussed NSAIDs at home, prednisone taper and following up with rheumatology.  He is agreeable. [DS]    Clinical Course User  Index [DS] Delton Prairie, MD    ____________________________________________   FINAL CLINICAL IMPRESSION(S) / ED DIAGNOSES  Final diagnoses:  Rheumatoid arthritis flare [facility])     ED Discharge Orders         Ordered    predniSONE (STERAPRED UNI-PAK 21 TAB) 10 MG (21) TBPK tablet        05/12/21 1511           Blondine Hottel   Note:  This document was prepared using Dragon voice recognition software and may include unintentional dictation errors.   Delton Prairie, MD 05/12/21 [phone]

## 2021-05-12 NOTE — ED Notes (Signed)
Pt calm , collective , denied pain or sob  

## 2021-05-12 NOTE — ED Triage Notes (Signed)
First Nurse Note:  Arrives with c/o 3 day history of RA exacerbation.  C/O swelling to joints and pain.  Patient is AAOx3.  Skin warm and dry.  NAD.

## 2021-05-12 NOTE — Discharge Instructions (Addendum)
Use naproxen/Aleve for anti-inflammatory pain relief. Use up to 500mg  every 12 hours. Do not take more frequently than this. Do not use other NSAIDs (ibuprofen, Advil) while taking this medication. It is safe to take Tylenol with this.  Use Tylenol for pain and fevers.  Up to 1000 mg per dose, up to 4 times per day.  Do not take more than 4000 mg of Tylenol/acetaminophen within 24 hours. 

## 2021-06-25 ENCOUNTER — Other Ambulatory Visit: Payer: Self-pay

## 2021-06-25 ENCOUNTER — Inpatient Hospital Stay
Admission: EM | Admit: 2021-06-25 | Discharge: 2021-07-04 | DRG: 897 | Disposition: A | Discharge status: Home / Self Care | Payer: Self-pay | Attending: Student | Admitting: Student

## 2021-06-25 ENCOUNTER — Emergency Department: Payer: Self-pay

## 2021-06-25 DIAGNOSIS — F10939 Alcohol use, unspecified with withdrawal, unspecified: Secondary | ICD-10-CM | POA: Diagnosis present

## 2021-06-25 DIAGNOSIS — E039 Hypothyroidism, unspecified: Secondary | ICD-10-CM | POA: Diagnosis present

## 2021-06-25 DIAGNOSIS — K760 Fatty (change of) liver, not elsewhere classified: Secondary | ICD-10-CM | POA: Diagnosis present

## 2021-06-25 DIAGNOSIS — Z20822 Contact with and (suspected) exposure to covid-19: Secondary | ICD-10-CM | POA: Diagnosis present

## 2021-06-25 DIAGNOSIS — Z882 Allergy status to sulfonamides status: Secondary | ICD-10-CM

## 2021-06-25 DIAGNOSIS — Z7151 Drug abuse counseling and surveillance of drug abuser: Secondary | ICD-10-CM

## 2021-06-25 DIAGNOSIS — F1220 Cannabis dependence, uncomplicated: Secondary | ICD-10-CM | POA: Diagnosis present

## 2021-06-25 DIAGNOSIS — G4700 Insomnia, unspecified: Secondary | ICD-10-CM | POA: Diagnosis present

## 2021-06-25 DIAGNOSIS — Z79890 Hormone replacement therapy (postmenopausal): Secondary | ICD-10-CM

## 2021-06-25 DIAGNOSIS — M069 Rheumatoid arthritis, unspecified: Secondary | ICD-10-CM | POA: Diagnosis present

## 2021-06-25 DIAGNOSIS — F17200 Nicotine dependence, unspecified, uncomplicated: Secondary | ICD-10-CM | POA: Diagnosis present

## 2021-06-25 DIAGNOSIS — R112 Nausea with vomiting, unspecified: Secondary | ICD-10-CM

## 2021-06-25 DIAGNOSIS — Z79891 Long term (current) use of opiate analgesic: Secondary | ICD-10-CM

## 2021-06-25 DIAGNOSIS — Z79899 Other long term (current) drug therapy: Secondary | ICD-10-CM

## 2021-06-25 DIAGNOSIS — F6089 Other specific personality disorders: Secondary | ICD-10-CM | POA: Diagnosis present

## 2021-06-25 DIAGNOSIS — F1111 Opioid abuse, in remission: Secondary | ICD-10-CM | POA: Diagnosis present

## 2021-06-25 DIAGNOSIS — I10 Essential (primary) hypertension: Secondary | ICD-10-CM | POA: Diagnosis present

## 2021-06-25 DIAGNOSIS — F10239 Alcohol dependence with withdrawal, unspecified: Secondary | ICD-10-CM | POA: Diagnosis present

## 2021-06-25 DIAGNOSIS — F10930 Alcohol use, unspecified with withdrawal, uncomplicated: Secondary | ICD-10-CM

## 2021-06-25 DIAGNOSIS — K2920 Alcoholic gastritis without bleeding: Secondary | ICD-10-CM | POA: Diagnosis present

## 2021-06-25 DIAGNOSIS — F332 Major depressive disorder, recurrent severe without psychotic features: Secondary | ICD-10-CM | POA: Diagnosis present

## 2021-06-25 DIAGNOSIS — E559 Vitamin D deficiency, unspecified: Secondary | ICD-10-CM | POA: Diagnosis present

## 2021-06-25 DIAGNOSIS — Z8249 Family history of ischemic heart disease and other diseases of the circulatory system: Secondary | ICD-10-CM

## 2021-06-25 DIAGNOSIS — Z9114 Patient's other noncompliance with medication regimen: Secondary | ICD-10-CM

## 2021-06-25 DIAGNOSIS — F10231 Alcohol dependence with withdrawal delirium: Secondary | ICD-10-CM

## 2021-06-25 DIAGNOSIS — Z720 Tobacco use: Secondary | ICD-10-CM

## 2021-06-25 DIAGNOSIS — F411 Generalized anxiety disorder: Secondary | ICD-10-CM | POA: Diagnosis present

## 2021-06-25 DIAGNOSIS — E871 Hypo-osmolality and hyponatremia: Secondary | ICD-10-CM | POA: Diagnosis present

## 2021-06-25 DIAGNOSIS — Z7141 Alcohol abuse counseling and surveillance of alcoholic: Secondary | ICD-10-CM

## 2021-06-25 DIAGNOSIS — E611 Iron deficiency: Secondary | ICD-10-CM | POA: Diagnosis present

## 2021-06-25 DIAGNOSIS — K2970 Gastritis, unspecified, without bleeding: Secondary | ICD-10-CM

## 2021-06-25 DIAGNOSIS — F1020 Alcohol dependence, uncomplicated: Secondary | ICD-10-CM

## 2021-06-25 DIAGNOSIS — F251 Schizoaffective disorder, depressive type: Secondary | ICD-10-CM | POA: Diagnosis present

## 2021-06-25 DIAGNOSIS — Z818 Family history of other mental and behavioral disorders: Secondary | ICD-10-CM

## 2021-06-25 DIAGNOSIS — Z8616 Personal history of COVID-19: Secondary | ICD-10-CM

## 2021-06-25 DIAGNOSIS — F10230 Alcohol dependence with withdrawal, uncomplicated: Principal | ICD-10-CM | POA: Diagnosis present

## 2021-06-25 DIAGNOSIS — R1011 Right upper quadrant pain: Secondary | ICD-10-CM

## 2021-06-25 LAB — CBC WITH DIFFERENTIAL/PLATELET
Abs Immature Granulocytes: 0.04 10*3/uL (ref 0.00–0.07)
Basophils Absolute: 0 10*3/uL (ref 0.0–0.1)
Basophils Relative: 0 %
Eosinophils Absolute: 0 10*3/uL (ref 0.0–0.5)
Eosinophils Relative: 0 %
HCT: 44.8 % (ref 39.0–52.0)
Hemoglobin: 15.4 g/dL (ref 13.0–17.0)
Immature Granulocytes: 0 %
Lymphocytes Relative: 13 %
Lymphs Abs: 1.4 10*3/uL (ref 0.7–4.0)
MCH: 29.3 pg (ref 26.0–34.0)
MCHC: 34.4 g/dL (ref 30.0–36.0)
MCV: 85.2 fL (ref 80.0–100.0)
Monocytes Absolute: 0.7 10*3/uL (ref 0.1–1.0)
Monocytes Relative: 7 %
Neutro Abs: 8.6 10*3/uL — ABNORMAL HIGH (ref 1.7–7.7)
Neutrophils Relative %: 80 %
Platelets: 301 10*3/uL (ref 150–400)
RBC: 5.26 MIL/uL (ref 4.22–5.81)
RDW: 15.5 % (ref 11.5–15.5)
WBC: 10.8 10*3/uL — ABNORMAL HIGH (ref 4.0–10.5)
nRBC: 0 % (ref 0.0–0.2)

## 2021-06-25 LAB — URINALYSIS, ROUTINE W REFLEX MICROSCOPIC
Bilirubin Urine: NEGATIVE
Glucose, UA: NEGATIVE mg/dL
Hgb urine dipstick: NEGATIVE
Ketones, ur: NEGATIVE mg/dL
Leukocytes,Ua: NEGATIVE
Nitrite: NEGATIVE
Protein, ur: NEGATIVE mg/dL
Specific Gravity, Urine: >1.046 — ABNORMAL HIGH (ref 1.005–1.030)
pH: 7 (ref 5.0–8.0)

## 2021-06-25 LAB — URINE DRUG SCREEN, QUALITATIVE (ARMC ONLY)
Amphetamines, Ur Screen: NOT DETECTED
Barbiturates, Ur Screen: NOT DETECTED
Benzodiazepine, Ur Scrn: NOT DETECTED
Cannabinoid 50 Ng, Ur ~~LOC~~: NOT DETECTED
Cocaine Metabolite,Ur ~~LOC~~: POSITIVE — AB
MDMA (Ecstasy)Ur Screen: NOT DETECTED
Methadone Scn, Ur: NOT DETECTED
Opiate, Ur Screen: POSITIVE — AB
Phencyclidine (PCP) Ur S: NOT DETECTED
Tricyclic, Ur Screen: NOT DETECTED

## 2021-06-25 LAB — COMPREHENSIVE METABOLIC PANEL
ALT: 78 U/L — ABNORMAL HIGH (ref 0–44)
AST: 161 U/L — ABNORMAL HIGH (ref 15–41)
Albumin: 4 g/dL (ref 3.5–5.0)
Alkaline Phosphatase: 91 U/L (ref 38–126)
Anion gap: 15 (ref 5–15)
BUN: 9 mg/dL (ref 6–20)
CO2: 22 mmol/L (ref 22–32)
Calcium: 8.9 mg/dL (ref 8.9–10.3)
Chloride: 95 mmol/L — ABNORMAL LOW (ref 98–111)
Creatinine, Ser: 1.05 mg/dL (ref 0.61–1.24)
GFR, Estimated: >60 mL/min (ref >60)
Glucose, Bld: 123 mg/dL — ABNORMAL HIGH (ref 70–99)
Potassium: 3.5 mmol/L (ref 3.5–5.1)
Sodium: 132 mmol/L — ABNORMAL LOW (ref 135–145)
Total Bilirubin: 1.7 mg/dL — ABNORMAL HIGH (ref 0.3–1.2)
Total Protein: 8.4 g/dL — ABNORMAL HIGH (ref 6.5–8.1)

## 2021-06-25 LAB — ETHANOL: Alcohol, Ethyl (B): <10 mg/dL (ref <10)

## 2021-06-25 LAB — TROPONIN I (HIGH SENSITIVITY): Troponin I (High Sensitivity): 6 ng/L (ref <18)

## 2021-06-25 LAB — LIPASE, BLOOD: Lipase: 60 U/L — ABNORMAL HIGH (ref 11–51)

## 2021-06-25 MED ORDER — LORAZEPAM 2 MG/ML IJ SOLN
0.0000 mg | Freq: Two times a day (BID) | INTRAMUSCULAR | Status: AC
  Filled 2021-06-25 (×2): qty 1

## 2021-06-25 MED ORDER — LORAZEPAM 2 MG/ML IJ SOLN: 0.0000 mg | Freq: Four times a day (QID) | INTRAMUSCULAR | Status: AC

## 2021-06-25 MED ORDER — HYDROMORPHONE HCL 1 MG/ML IJ SOLN
1.0000 mg | Freq: Once | INTRAMUSCULAR | Status: AC
  Administered 2021-06-25: 1 mg via INTRAVENOUS
  Filled 2021-06-25: qty 1

## 2021-06-25 MED ORDER — SODIUM CHLORIDE 0.9 % IV BOLUS
1000.0000 mL | Freq: Once | INTRAVENOUS | Status: AC
  Administered 2021-06-25: 1000 mL via INTRAVENOUS

## 2021-06-25 MED ORDER — THIAMINE HCL 100 MG PO TABS: 100.0000 mg | ORAL_TABLET | Freq: Every day | ORAL | Status: DC

## 2021-06-25 MED ORDER — SUCRALFATE 1 G PO TABS
1.0000 g | ORAL_TABLET | Freq: Once | ORAL | Status: AC
  Administered 2021-06-25: 1 g via ORAL
  Filled 2021-06-25: qty 1

## 2021-06-25 MED ORDER — NICOTINE 21 MG/24HR TD PT24
21.0000 mg | MEDICATED_PATCH | Freq: Every day | TRANSDERMAL | Status: DC
  Filled 2021-06-25: qty 1

## 2021-06-25 MED ORDER — THIAMINE HCL 100 MG/ML IJ SOLN
100.0000 mg | Freq: Once | INTRAMUSCULAR | Status: AC
  Administered 2021-06-25: 100 mg via INTRAVENOUS
  Filled 2021-06-25: qty 2

## 2021-06-25 MED ORDER — PANTOPRAZOLE SODIUM 40 MG IV SOLR
40.0000 mg | Freq: Once | INTRAVENOUS | Status: AC
  Administered 2021-06-25: 40 mg via INTRAVENOUS
  Filled 2021-06-25: qty 40

## 2021-06-25 MED ORDER — IOHEXOL 300 MG/ML  SOLN
100.0000 mL | Freq: Once | INTRAMUSCULAR | Status: AC | PRN
  Administered 2021-06-25: 100 mL via INTRAVENOUS

## 2021-06-25 MED ORDER — LORAZEPAM 2 MG PO TABS
0.0000 mg | ORAL_TABLET | Freq: Two times a day (BID) | ORAL | Status: AC
Start: 2021-06-28 — End: 2021-06-29
  Administered 2021-06-29: 2 mg via ORAL
  Filled 2021-06-25 (×2): qty 1

## 2021-06-25 MED ORDER — THIAMINE HCL 100 MG/ML IJ SOLN: 100.0000 mg | Freq: Every day | INTRAMUSCULAR | Status: DC

## 2021-06-25 MED ORDER — ALUM & MAG HYDROXIDE-SIMETH 200-200-20 MG/5ML PO SUSP
30.0000 mL | Freq: Once | ORAL | Status: AC
  Administered 2021-06-25: 30 mL via ORAL
  Filled 2021-06-25: qty 30

## 2021-06-25 MED ORDER — LORAZEPAM 2 MG PO TABS
0.0000 mg | ORAL_TABLET | Freq: Four times a day (QID) | ORAL | Status: AC
  Administered 2021-06-26: 2 mg via ORAL
  Administered 2021-06-26: 1 mg via ORAL
  Filled 2021-06-25 (×2): qty 1

## 2021-06-25 MED ORDER — ONDANSETRON HCL 4 MG/2ML IJ SOLN
4.0000 mg | Freq: Once | INTRAMUSCULAR | Status: AC
  Administered 2021-06-25: 4 mg via INTRAVENOUS
  Filled 2021-06-25: qty 2

## 2021-06-25 MED ORDER — LORAZEPAM 2 MG/ML IJ SOLN
1.0000 mg | Freq: Once | INTRAMUSCULAR | Status: AC
  Administered 2021-06-25: 1 mg via INTRAVENOUS
  Filled 2021-06-25: qty 1

## 2021-06-25 MED ORDER — DROPERIDOL 2.5 MG/ML IJ SOLN
1.2500 mg | Freq: Once | INTRAMUSCULAR | Status: AC
  Administered 2021-06-25: 1.25 mg via INTRAVENOUS
  Filled 2021-06-25: qty 2

## 2021-06-25 NOTE — H&P (Signed)
History and Physical   Curtis Cobb:[id number] DOB: Sep 11, 1971 DOA: 06/25/2021  Referring MD/NP/PA: Dr. Fuller Plan  PCP: Patient, No Pcp Per (Inactive)   Outpatient Specialists: None  Patient coming from: Home  Chief Complaint: Intractable nausea vomiting abdominal pain  HPI: Curtis Cobb is a 50 y.o. male with medical history significant of alcohol abuse and dependence, anxiety disorder, schizoaffective disorder, hypertension, polysubstance abuse including narcotics and tobacco, cannabis abuse, who presented to the ER with intractable nausea vomiting abdominal pain.  Patient has not been able to keep anything down.  She said last drink was a few days ago.  He appears to be having alcohol withdrawal symptoms.  He was given Librium GI cocktail in the ER but patient continues to have epigastric pain and and vomiting.  His pain is as high as 8 out of 10 going to his back.  There is suspicion for some type of gastritis from alcohol.  With uncontrolled nausea vomiting he is being admitted to the hospital for an evaluation.  Patient reported desire not to drink anymore..  ED Course: Temperature 98.7 blood pressure 147/108.  Pulse is 132 respirate of 25 oxygen sat 90%.  White count is 10.8.  Sodium 132 chloride 95 the rest of the chemistry appeared to be within normal.  Alcohol level less than 10.  Abdominal ultrasound showed no evidence of gallbladder disease except for sludge.  Some fatty liver.  CT abdomen pelvis showed type I hiatal hernia diffuse hepatic steatosis chronic borderline prominence of the dorsal pancreatic duct and there is an impingement at L4-5 and L5-S1.  He is being admitted with alcohol withdrawal symptoms for treatment.  Review of Systems: As per HPI otherwise 10 point review of systems negative.    Past Medical History:  Diagnosis Date   Alcohol use disorder, moderate, dependence (HCC) 02/13/2014   Anxiety    COVID-19 03/29/2021   Diagnosed in [facility] ED on  03/29/2021   Depression    Hypertension    Rheumatoid arthritis (HCC)    Substance abuse [facility])     Past Surgical History:  Procedure Laterality Date   INCISION AND DRAINAGE ABSCESS Right 08/25/2018   Procedure: INCISION AND DRAINAGE ABSCESS;  Surgeon: Christena Flake, MD;  Location: [facility] ORS;  Service: Orthopedics;  Laterality: Right;   INCISION AND DRAINAGE ABSCESS Right 08/30/2018   Procedure: INCISION AND DRAINAGE ABSCESS;  Surgeon: Christena Flake, MD;  Location: [facility] ORS;  Service: Orthopedics;  Laterality: Right;   none       reports that he has never smoked. He has never used smokeless tobacco. He reports current alcohol use. He reports that he does not use drugs.  Allergies  Allergen Reactions   Sulfa Antibiotics Other (See Comments)    Unknown reaction    Family History  Problem Relation Age of Onset   Heart attack Mother    Stroke Mother    Hypertension Mother    Mental illness Mother        Manic Depressive Bipolar Disorder   Heart disease Mother    Hypertension Maternal Aunt    Mental illness Maternal Aunt    Hypertension Maternal Aunt    Mental illness Maternal Aunt      Prior to Admission medications   Medication Sig Start Date End Date Taking? Authorizing Provider  amitriptyline (ELAVIL) 100 MG tablet Take 1 tablet (100 mg total) by mouth at bedtime. Patient not taking: Reported on 06/25/2021 10/17/18   Shari Prows, MD  busPIRone (BUSPAR) 10 MG tablet Take 10 mg by mouth 2 (two) times daily. Patient not taking: Reported on 06/25/2021 03/18/21   [provider]  divalproex (DEPAKOTE) 500 MG DR tablet Take 1 tablet (500 mg total) by mouth every 12 (twelve) hours. Patient not taking: Reported on 06/25/2021 04/07/21   Clapacs, Jackquline Denmark, MD  FLUoxetine (PROZAC) 20 MG capsule Take 1 capsule (20 mg total) by mouth daily. 01/29/19 01/29/20  Myrna Blazer, MD  FLUoxetine (PROZAC) 40 MG capsule Take 40 mg by mouth daily. Patient not taking: Reported on  06/25/2021 03/18/21   [provider]  levothyroxine (SYNTHROID, LEVOTHROID) 25 MCG tablet Take 1 tablet (25 mcg total) by mouth daily before breakfast. Patient not taking: Reported on 06/25/2021 10/18/18   Pucilowska, Braulio Conte B, MD  naltrexone (DEPADE) 50 MG tablet Take 1 tablet (50 mg total) by mouth daily. Patient not taking: Reported on 06/25/2021 11/13/18   Governor Rooks, MD  prazosin (MINIPRESS) 1 MG capsule Take 1 capsule (1 mg total) by mouth 2 (two) times daily. Patient not taking: No sig reported 10/17/18   Pucilowska, Jolanta B, MD  QUEtiapine (SEROQUEL) 200 MG tablet Take 1 tablet (200 mg total) by mouth at bedtime. Patient not taking: Reported on 06/25/2021 04/07/21   Audery Amel, MD    Physical Exam: Vitals:   06/25/21 2200 06/25/21 2215 06/25/21 2230 06/25/21 2245  BP: 118/82  (!) 125/91   Pulse: 90 91 94 89  Resp:  Temp:      TempSrc:      SpO2: 95% 95% 93% 94%  Weight:      Height:          Constitutional: Acutely ill looking, tremulous, no distress Vitals:   06/25/21 2200 06/25/21 2215 06/25/21 2230 06/25/21 2245  BP: 118/82  (!) 125/91   Pulse: 90 91 94 89  Resp:  Temp:      TempSrc:      SpO2: 95% 95% 93% 94%  Weight:      Height:       Eyes: PERRL, lids and conjunctivae normal ENMT: Mucous membranes are dry.  Posterior pharynx clear of any exudate or lesions.Normal dentition.  Neck: normal, supple, no masses, no thyromegaly Respiratory: clear to auscultation bilaterally, no wheezing, no crackles. Normal respiratory effort. No accessory muscle use.  Cardiovascular: Sinus tachycardia, no murmurs / rubs / gallops. No extremity edema. 2+ pedal pulses. No carotid bruits.  Abdomen: no tenderness, no masses palpated. No hepatosplenomegaly. Bowel sounds positive.  Musculoskeletal: no clubbing / cyanosis. No joint deformity upper and lower extremities. Good ROM, no contractures. Normal muscle tone.  Skin: no rashes, lesions, ulcers.  No induration Neurologic: CN 2-12 grossly intact. Sensation intact, DTR normal. Strength 5/5 in all 4.  Coarse tremor Psychiatric: Normal judgment and insight. Alert and oriented x 3.  Anxious mood.     Labs on Admission: I have personally reviewed following labs and imaging studies  CBC: Recent Labs  Lab 06/25/21 1716  WBC 10.8*  NEUTROABS 8.6*  HGB 15.4  HCT 44.8  MCV 85.2  PLT 301   Basic Metabolic Panel: Recent Labs  Lab 06/25/21 1716  NA 132*  K 3.5  CL 95*  CO2 22  GLUCOSE 123*  BUN 9  CREATININE 1.05  CALCIUM 8.9   GFR: Estimated Creatinine Clearance: 109.7 mL/min (by C-G formula based on SCr of 1.05 mg/dL). Liver Function Tests: Recent Labs  Lab 06/25/21 1716  AST 161*  ALT 78*  ALKPHOS 91  BILITOT 1.7*  PROT 8.4*  ALBUMIN 4.0   Recent Labs  Lab 06/25/21 1716  LIPASE 60*   No results for input(s): AMMONIA in the last 168 hours. Coagulation Profile: No results for input(s): INR, PROTIME in the last 168 hours. Cardiac Enzymes: No results for input(s): CKTOTAL, CKMB, CKMBINDEX, TROPONINI in the last 168 hours. BNP (last 3 results) No results for input(s): PROBNP in the last 8760 hours. HbA1C: No results for input(s): HGBA1C in the last 72 hours. CBG: No results for input(s): GLUCAP in the last 168 hours. Lipid Profile: No results for input(s): CHOL, HDL, LDLCALC, TRIG, CHOLHDL, LDLDIRECT in the last 72 hours. Thyroid Function Tests: No results for input(s): TSH, T4TOTAL, FREET4, T3FREE, THYROIDAB in the last 72 hours. Anemia Panel: No results for input(s): VITAMINB12, FOLATE, FERRITIN, TIBC, IRON, RETICCTPCT in the last 72 hours. Urine analysis:    Component Value Date/Time   COLORURINE YELLOW (A) 08/23/2018 2158   APPEARANCEUR CLEAR (A) 08/23/2018 2158   LABSPEC 1.018 08/23/2018 2158   PHURINE 6.0 08/23/2018 2158   GLUCOSEU NEGATIVE 08/23/2018 2158   HGBUR SMALL (A) 08/23/2018 2158   BILIRUBINUR NEGATIVE 08/23/2018 2158   KETONESUR  NEGATIVE 08/23/2018 2158   PROTEINUR NEGATIVE 08/23/2018 2158   UROBILINOGEN 0.2 03/15/2014 1928   NITRITE NEGATIVE 08/23/2018 2158   LEUKOCYTESUR NEGATIVE 08/23/2018 2158   Sepsis Labs: (procalcitonin:4,lacticidven:4) )No results found for this or any previous visit (from the past 240 hour(s)).   Radiological Exams on Admission: CT ABDOMEN PELVIS W CONTRAST  Result Date: 06/25/2021 CLINICAL DATA:  Abdominal pain with nausea and vomiting for 8 days EXAM: CT ABDOMEN AND PELVIS WITH CONTRAST TECHNIQUE: Multidetector CT imaging of the abdomen and pelvis was performed using the standard protocol following bolus administration of intravenous contrast. CONTRAST:  OMNIPAQUE IOHEXOL 300 MG/ML  SOLN COMPARISON:  03/25/2016 FINDINGS: Lower chest: Small type 1 hiatal hernia. Adjacent 0.8 cm paraesophageal lymph node on image 15 series 2, formerly 0.7 cm. Hepatobiliary: Diffuse hepatic steatosis.  Otherwise unremarkable. Pancreas: Borderline prominence of caliber in the dorsal pancreatic duct in the pancreatic head and body but not in the pancreatic tail. This is a similar appearance to the 03/25/2016 exam. Spleen: Unremarkable Adrenals/Urinary Tract: Small hypodense right renal lesions are statistically likely to be small cysts although technically nonspecific due to small size. Adrenal glands normal. Stomach/Bowel: Small type 1 hiatal hernia. Sigmoid colon diverticulosis. Vascular/Lymphatic: Right external iliac node borderline prominent at 1.0 cm in short axis on image 82 series 2, formerly the same. Likewise there is a stable 1.0 cm left external iliac lymph node on image 80 series 2. Reproductive: Unremarkable Other: No supplemental non-categorized findings. 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 Lower lumbar spondylosis and degenerative disc disease causing left foraminal impingement at L4-5 and bilateral foraminal  impingement at L5-S1. IMPRESSION: 1. A specific cause for the patient's recent abdominal pain with nausea and vomiting is not identified. 2. Other imaging findings of potential clinical significance: Small type 1 hiatal hernia. Diffuse hepatic steatosis. Chronic borderline prominence of the dorsal pancreatic duct in the pancreatic head and body. Sigmoid colon diverticulosis. Impingement at L4-5 and L5-S1. Electronically Signed   By: Gaylyn Rong M.D.   On: 06/25/2021 18:48   US ABDOMEN LIMITED RUQ (LIVER/GB)  Result Date: 06/25/2021 CLINICAL DATA:  Right upper quadrant pain with nausea and vomiting x8 days. EXAM: ULTRASOUND ABDOMEN LIMITED RIGHT UPPER QUADRANT COMPARISON:  None. FINDINGS: Gallbladder: Echogenic sludge is seen within the gallbladder lumen. No gallstones or wall thickening visualized (2.5 mm). No sonographic Murphy sign noted by sonographer. Common bile duct: Diameter: 5.4 mm Liver: No focal lesion identified. Heterogeneous, diffusely increased echogenicity of the liver parenchyma is seen. Portal vein is patent on color Doppler imaging with normal direction of blood flow towards the liver. Other: None. IMPRESSION: 1. Gallbladder sludge without evidence of cholelithiasis or acute cholecystitis. 2. Fatty liver. Electronically Signed   By: Aram Candela M.D.   On: 06/25/2021 22:42    EKG: Independently reviewed.  Shows sinus tachycardia otherwise no significant findings.  Assessment/Plan Principal Problem:   Alcohol withdrawal (HCC) Active Problems:   Schizoaffective disorder, depressive type (HCC)   Alcohol use disorder, severe, dependence (HCC)   Cannabis use disorder, moderate, dependence (HCC)   HTN (hypertension)   Rheumatoid arthritis (HCC)   Opioid use disorder, mild, in sustained remission (HCC)   MDD (major depressive disorder), recurrent episode, severe (HCC)   Tobacco use disorder   Hypothyroidism   Hyponatremia     #1 alcohol abuse with withdrawals: Patient  will be admitted for symptoms management.  His intractable nausea vomiting most likely is from alcohol withdrawals.  Will initiate CIWA protocol.  Add Protonix and sacral fate if tolerated.  Counseling to be provided.  #2 schizoaffective disorder: Patient on multiple medications at home that he is not taking due to cost.  This includes Seroquel and fluoxetine.  We will resume some medications to control his symptoms.  #3 essential hypertension: Again not taking medications at home see issue.  Initiate beta-blockers for now.  #4 history of rheumatoid arthritis: Patient has not been on any treatment and that does not appear to be any flareups.  #5 hypothyroidism: Has avoided taking his medications due to cost.  Resume his levothyroxine and assist patient in getting this essential medicine at discharge.  #6 hyponatremia: Probably due to alcoholism.  Continue supportive care with hydration.  #7 polysubstance abuse: Get urine drug screen and add nicotine patch.   DVT prophylaxis: Lovenox Code Status: Full code Family Communication: No family at bedside Disposition Plan: Home Consults called: None Admission status: Observation  Severity of Illness: The appropriate patient status for this patient is INPATIENT. Inpatient status is judged to be reasonable and necessary in order to provide the required intensity of service to ensure the patient's safety. The patient's presenting symptoms, physical exam findings, and initial radiographic and laboratory data in the context of their chronic comorbidities is felt to place them at high risk for further clinical deterioration. Furthermore, it is not anticipated that the patient will be medically stable for discharge from the hospital within 2 midnights of admission. The following factors support the patient status of inpatient.   " The patient's presenting symptoms include nausea vomiting. " The worrisome physical exam findings include epigastric  tenderness. " The initial radiographic and laboratory data are worrisome because of alcohol level less than 10. " The chronic co-morbidities include alcoholism.   * I certify that at the point of admission it is my clinical judgment that the patient will require inpatient hospital care spanning beyond 2 midnights from the point of admission due to high intensity of service, high risk for further deterioration and high frequency of surveillance required.Lonia Blood MD [facility] Pager [phone]  If 7PM-7AM, please contact night-coverage [email or web address] Password [place]  06/25/2021, 11:03 PM

## 2021-06-25 NOTE — ED Notes (Signed)
Dr. Fuller Plan states repeat trop not  Needed.

## 2021-06-25 NOTE — ED Notes (Signed)
This RN found pt. In mild distress with tremor with arms extended. Discussed with pt. His ETOH habits and he states he drinks heavily daily and has not had a drink since last night. MD notified, CIWA protocol initiated. Orders to follow.

## 2021-06-25 NOTE — ED Triage Notes (Signed)
Hx of ETOH abuse. Pt states last drink was yesterday afternoon around 1700.

## 2021-06-25 NOTE — ED Notes (Signed)
ED Provider at bedside. 

## 2021-06-25 NOTE — ED Notes (Signed)
MD notified of pt's CIWA score, see MAR

## 2021-06-25 NOTE — ED Triage Notes (Signed)
Pt BIBA from home for abd pain and N/V x 8 days. Pt has not been able to eat x 8 days due to sx. On EMS arrival HR was 160s. BP 162/82. 98% room air.

## 2021-06-25 NOTE — ED Notes (Signed)
Dr. Fuller Plan ok'd pt to receive droperidol and ativan at the same time.

## 2021-06-25 NOTE — ED Provider Notes (Signed)
[facility] Emergency Department Provider Note  ____________________________________________   Event Date/Time   First MD Initiated Contact with Patient 06/25/21 1718     (approximate)  I have reviewed the triage vital signs and the nursing notes.   HISTORY  Chief Complaint Abdominal Pain and Tachycardia    HPI Curtis Cobb is a 50 y.o. male alcohol use, substance abuse, who comes in with abdominal pain. Drinks 1/5 of vodka for the past 2 weeks daily but now coming in with severe abdominal pain.  The pain started 8 or 9 days ago, constant, now worsening and spreading over his entire abdomen, and not better with Rolaids, worse with trying to eat.  Associated with some nausea and vomiting.          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   Sedative, hypnotic or anxiolytic use disorder, severe, 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   MDD (major depressive disorder), recurrent episode, severe (HCC) 11/17/2015   Opioid use disorder, mild, in sustained remission (HCC) 10/08/2015   Alcohol use disorder, severe, dependence (HCC) 10/07/2015   Cannabis use disorder, moderate, dependence (HCC) 10/07/2015   HTN (hypertension) 10/07/2015   Rheumatoid arthritis (HCC) 10/07/2015   Schizoaffective disorder, depressive type [facility])     Past Surgical History:  Procedure Laterality Date   INCISION AND DRAINAGE ABSCESS Right 08/25/2018   Procedure: INCISION AND DRAINAGE ABSCESS;  Surgeon: Christena Flake, MD;  Location: [facility] ORS;  Service: Orthopedics;  Laterality:  Right;   INCISION AND DRAINAGE ABSCESS Right 08/30/2018   Procedure: INCISION AND DRAINAGE ABSCESS;  Surgeon: Christena Flake, MD;  Location: [facility] ORS;  Service: Orthopedics;  Laterality: Right;   none      Prior to Admission medications   Medication Sig Start Date End Date Taking? Authorizing Provider  amitriptyline (ELAVIL) 100 MG tablet Take 1 tablet (100 mg total) by mouth at bedtime. 10/17/18   Pucilowska, Jolanta B, MD  busPIRone (BUSPAR) 10 MG tablet Take 10 mg by mouth 2 (two) times daily. 03/18/21   [provider]  divalproex (DEPAKOTE) 500 MG DR tablet Take 1 tablet (500 mg total) by mouth every 12 (twelve) hours. 04/07/21   Clapacs, Jackquline Denmark, MD  FLUoxetine (PROZAC) 20 MG capsule Take 1 capsule (20 mg total) by mouth daily. 01/29/19 01/29/20  Myrna Blazer, MD  FLUoxetine (PROZAC) 40 MG capsule Take 40 mg by mouth daily. 03/18/21   [provider]  levothyroxine (SYNTHROID, LEVOTHROID) 25 MCG tablet Take 1 tablet (25 mcg total) by mouth daily before breakfast. 10/18/18   Pucilowska, Jolanta B, MD  naltrexone (DEPADE) 50 MG tablet Take 1 tablet (50 mg total) by mouth daily. 11/13/18   Governor Rooks, MD  prazosin (MINIPRESS) 1 MG capsule Take 1 capsule (1 mg total) by mouth 2 (two) times daily. Patient not taking: No sig reported 10/17/18   Pucilowska, Jolanta B, MD  QUEtiapine (SEROQUEL) 200 MG tablet Take 1 tablet (200 mg total) by mouth at bedtime. 04/07/21   Clapacs, Jackquline Denmark, MD  Allergies Sulfa antibiotics  Family History  Problem Relation Age of Onset   Heart attack Mother    Stroke Mother    Hypertension Mother    Mental illness Mother        Manic Depressive Bipolar Disorder   Heart disease Mother    Hypertension Maternal Aunt    Mental illness Maternal Aunt    Hypertension Maternal Aunt    Mental illness Maternal Aunt     Social History Social History   Tobacco Use   Smoking status: Never   Smokeless tobacco: Never  Substance Use Topics    Alcohol use: Yes    Comment: beer 6 hours ago 03/28/2021   Drug use: No    Types: Amphetamines, Marijuana, Cocaine, Heroin    Comment: Patient denies recent drug use.      Review of Systems Constitutional: No fever/chills Eyes: No visual changes. ENT: No sore throat. Cardiovascular: Denies chest pain. Respiratory: Denies shortness of breath. Gastrointestinal: Positive abdominal pain, nausea, vomiting Genitourinary: Negative for dysuria.  Dark urine Musculoskeletal: Negative for back pain. Skin: Negative for rash. Neurological: Negative for headaches, focal weakness or numbness. All other ROS negative ____________________________________________   PHYSICAL EXAM:  VITAL SIGNS: ED Triage Vitals  Enc Vitals Group     BP 06/25/21 1714 (!) 146/93     Pulse Rate 06/25/21 1714 (!) 130     Resp 06/25/21 1714 17     Temp 06/25/21 1714 98.7 F (37.1 C)     Temp Source 06/25/21 1714 Oral     SpO2 06/25/21 1714 97 %     Weight 06/25/21 1710 219 lb 8 oz (99.6 kg)     Height 06/25/21 1710 6\' 2"  (1.88 m)     Head Circumference --      Peak Flow --      Pain Score 06/25/21 1713 10     Pain Loc --      Pain Edu? --      Excl. in GC? --     Constitutional: Alert and oriented.  Patient appears in pain Eyes: Conjunctivae are normal. EOMI. Head: Atraumatic. Nose: No congestion/rhinnorhea. Mouth/Throat: Mucous membranes are moist.   Neck: No stridor. Trachea Midline. FROM Cardiovascular: Tachycardia, regular rhythm. Grossly normal heart sounds.  Good peripheral circulation. Respiratory: Normal respiratory effort.  No retractions. Lungs CTAB. Gastrointestinal: Tender throughout with guarding no distention. No abdominal bruits.  Musculoskeletal: No lower extremity tenderness nor edema.  No joint effusions. Neurologic:  Normal speech and language. No gross focal neurologic deficits are appreciated.  Skin:  Skin is warm, dry and intact. No rash noted. Psychiatric: Mood and affect are  normal. Speech and behavior are normal. GU: Deferred   ____________________________________________   LABS (all labs ordered are listed, but only abnormal results are displayed)  Labs Reviewed  CBC WITH DIFFERENTIAL/PLATELET - Abnormal; Notable for the following components:      Result Value   WBC 10.8 (*)    Neutro Abs 8.6 (*)    All other components within normal limits  COMPREHENSIVE METABOLIC PANEL - Abnormal; Notable for the following components:   Sodium 132 (*)    Chloride 95 (*)    Glucose, Bld 123 (*)    Total Protein 8.4 (*)    AST 161 (*)    ALT 78 (*)    Total Bilirubin 1.7 (*)    All other components within normal limits  LIPASE, BLOOD - Abnormal; Notable for the following components:   Lipase 60 (*)  All other components within normal limits  ETHANOL  URINALYSIS, ROUTINE W REFLEX MICROSCOPIC  URINE DRUG SCREEN, QUALITATIVE ([facility] ONLY)  TROPONIN I (HIGH SENSITIVITY)  TROPONIN I (HIGH SENSITIVITY)   ____________________________________________   ED ECG REPORT I, Concha Se, the attending physician, personally viewed and interpreted this ECG.  Sinus tachycardia rate of 133, no ST elevation, no T wave inversions except for aVL, normal intervals ____________________________________________  RADIOLOGY   Official radiology report(s): CT ABDOMEN PELVIS W CONTRAST  Result Date: 06/25/2021 CLINICAL DATA:  Abdominal pain with nausea and vomiting for 8 days EXAM: CT ABDOMEN AND PELVIS WITH CONTRAST TECHNIQUE: Multidetector CT imaging of the abdomen and pelvis was performed using the standard protocol following bolus administration of intravenous contrast. CONTRAST:  OMNIPAQUE IOHEXOL 300 MG/ML  SOLN COMPARISON:  03/25/2016 FINDINGS: Lower chest: Small type 1 hiatal hernia. Adjacent 0.8 cm paraesophageal lymph node on image 15 series 2, formerly 0.7 cm. Hepatobiliary: Diffuse hepatic steatosis.  Otherwise unremarkable. Pancreas: Borderline prominence of  caliber in the dorsal pancreatic duct in the pancreatic head and body but not in the pancreatic tail. This is a similar appearance to the 03/25/2016 exam. Spleen: Unremarkable Adrenals/Urinary Tract: Small hypodense right renal lesions are statistically likely to be small cysts although technically nonspecific due to small size. Adrenal glands normal. Stomach/Bowel: Small type 1 hiatal hernia. Sigmoid colon diverticulosis. Vascular/Lymphatic: Right external iliac node borderline prominent at 1.0 cm in short axis on image 82 series 2, formerly the same. Likewise there is a stable 1.0 cm left external iliac lymph node on image 80 series 2. Reproductive: Unremarkable Other: No supplemental non-categorized findings. 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 Lower lumbar spondylosis and degenerative disc disease causing left foraminal impingement at L4-5 and bilateral foraminal impingement at L5-S1. IMPRESSION: 1. A specific cause for the patient's recent abdominal pain with nausea and vomiting is not identified. 2. Other imaging findings of potential clinical significance: Small type 1 hiatal hernia. Diffuse hepatic steatosis. Chronic borderline prominence of the dorsal pancreatic duct in the pancreatic head and body. Sigmoid colon diverticulosis. Impingement at L4-5 and L5-S1. Electronically Signed   By: Gaylyn Rong M.D.   On: 06/25/2021 18:48    ____________________________________________   PROCEDURES  Procedure(s) performed (including Critical Care):  .1-3 Lead EKG Interpretation  Date/Time: 06/25/2021 7:55 PM Performed by: Concha Se, MD Authorized by: Concha Se, MD     Interpretation: abnormal     ECG rate:  100s   ECG rate assessment: tachycardic     Rhythm: sinus tachycardia     Ectopy: none     Conduction: normal     ____________________________________________   INITIAL IMPRESSION / ASSESSMENT AND PLAN /  ED COURSE  Curtis Cobb was evaluated in Emergency Department on 06/25/2021 for the symptoms described in the history of present illness. He was evaluated in the context of the global COVID-19 pandemic, which necessitated consideration that the patient might be at risk for infection with the SARS-CoV-2 virus that causes COVID-19. Institutional protocols and algorithms that pertain to the evaluation of patients at risk for COVID-19 are in a state of rapid change based on information released by regulatory bodies including the CDC and federal and state organizations. These policies and algorithms were followed during the patient's care in the ED.    Patient comes in with tachycardia, diffuse abdominal pain.  Will get CT imaging to make sure evidence of perforation, pancreatitis, pancreatic necrosis, obstruction  or other acute pathology.  We will give 1 L of fluid, Zofran, IV Dilaudid to help with pain.  Patient is tachycardic so we will keep him on the cardiac monitor to evaluate.  7:09 PM reevaluated patient.  CT imaging is reassuring except for some incidental findings I did discuss with patient.  His LFTs are elevated with his alcohol use.  Hepatic steatosis on CT imaging.  Patient is feeling much better after the medications.  Discussed with patient CT imaging I suspect is most likely gastritis, ulcer unable to be seen on CT.  We discussed management.  We will give some Protonix, Maalox and Carafate and try p.o. challenge.  Patient is interested in stopping drinking.  We discussed Librium taper for home with these additional medications.  Patient does look like he started to have a little bit of mild withdrawals so patient was given 1 mg of Ativan to help.  We are going to do a p.o. challenge.  10:55 PM he continues to fail p.o. challenge.  Even after droperidol, Ativan.  Patient continues to have withdrawal symptoms.  Patient is interested in trying to stop drinking therefore will admit to the  hospital for severe gastritis, inability to tolerate p.o. and alcohol withdrawal           ____________________________________________   FINAL CLINICAL IMPRESSION(S) / ED DIAGNOSES   Final diagnoses:  RUQ pain  Alcohol withdrawal syndrome without complication (HCC)  Intractable vomiting with nausea, unspecified vomiting type  Gastritis without bleeding, unspecified chronicity, unspecified gastritis type      MEDICATIONS GIVEN DURING THIS VISIT:  Medications  LORazepam (ATIVAN) injection 0-4 mg (has no administration in time range)    Or  LORazepam (ATIVAN) tablet 0-4 mg (has no administration in time range)  LORazepam (ATIVAN) injection 0-4 mg (has no administration in time range)    Or  LORazepam (ATIVAN) tablet 0-4 mg (has no administration in time range)  thiamine (B-1) injection 100 mg (100 mg Intravenous Given 06/25/21 1736)  sodium chloride 0.9 % bolus 1,000 mL (0 mLs Intravenous Stopped 06/25/21 1840)  ondansetron (ZOFRAN) injection 4 mg (4 mg Intravenous Given 06/25/21 1736)  HYDROmorphone (DILAUDID) injection 1 mg (1 mg Intravenous Given 06/25/21 1736)  iohexol (OMNIPAQUE) 300 MG/ML solution 100 mL (100 mLs Intravenous Contrast Given 06/25/21 1803)  pantoprazole (PROTONIX) injection 40 mg (40 mg Intravenous Given 06/25/21 1929)  alum & mag hydroxide-simeth (MAALOX/MYLANTA) 200-200-20 MG/5ML suspension 30 mL (30 mLs Oral Given 06/25/21 1931)  sucralfate (CARAFATE) tablet 1 g (1 g Oral Given 06/25/21 1931)  LORazepam (ATIVAN) injection 1 mg (1 mg Intravenous Given 06/25/21 2014)  droperidol (INAPSINE) 2.5 MG/ML injection 1.25 mg (1.25 mg Intravenous Given 06/25/21 2015)     ED Discharge Orders     None        Note:  This document was prepared using Dragon voice recognition software and may include unintentional dictation errors.    Concha Se, MD 06/25/21 2256

## 2021-06-26 ENCOUNTER — Encounter: Payer: Self-pay | Admitting: Internal Medicine

## 2021-06-26 ENCOUNTER — Other Ambulatory Visit: Payer: Self-pay

## 2021-06-26 LAB — COMPREHENSIVE METABOLIC PANEL
ALT: 67 U/L — ABNORMAL HIGH (ref 0–44)
AST: 124 U/L — ABNORMAL HIGH (ref 15–41)
Albumin: 3.1 g/dL — ABNORMAL LOW (ref 3.5–5.0)
Alkaline Phosphatase: 71 U/L (ref 38–126)
Anion gap: 10 (ref 5–15)
BUN: 6 mg/dL (ref 6–20)
CO2: 27 mmol/L (ref 22–32)
Calcium: 8 mg/dL — ABNORMAL LOW (ref 8.9–10.3)
Chloride: 98 mmol/L (ref 98–111)
Creatinine, Ser: 0.89 mg/dL (ref 0.61–1.24)
GFR, Estimated: >60 mL/min (ref >60)
Glucose, Bld: 94 mg/dL (ref 70–99)
Potassium: 3.1 mmol/L — ABNORMAL LOW (ref 3.5–5.1)
Sodium: 135 mmol/L (ref 135–145)
Total Bilirubin: 1.4 mg/dL — ABNORMAL HIGH (ref 0.3–1.2)
Total Protein: 6.8 g/dL (ref 6.5–8.1)

## 2021-06-26 LAB — CBC
HCT: 38.3 % — ABNORMAL LOW (ref 39.0–52.0)
Hemoglobin: 12.8 g/dL — ABNORMAL LOW (ref 13.0–17.0)
MCH: 28.4 pg (ref 26.0–34.0)
MCHC: 33.4 g/dL (ref 30.0–36.0)
MCV: 84.9 fL (ref 80.0–100.0)
Platelets: 237 10*3/uL (ref 150–400)
RBC: 4.51 MIL/uL (ref 4.22–5.81)
RDW: 15.8 % — ABNORMAL HIGH (ref 11.5–15.5)
WBC: 6.5 10*3/uL (ref 4.0–10.5)
nRBC: 0 % (ref 0.0–0.2)

## 2021-06-26 LAB — MAGNESIUM: Magnesium: 1.8 mg/dL (ref 1.7–2.4)

## 2021-06-26 LAB — PHOSPHORUS: Phosphorus: 4.2 mg/dL (ref 2.5–4.6)

## 2021-06-26 LAB — HIV ANTIBODY (ROUTINE TESTING W REFLEX): HIV Screen 4th Generation wRfx: NONREACTIVE

## 2021-06-26 LAB — TROPONIN I (HIGH SENSITIVITY): Troponin I (High Sensitivity): 5 ng/L (ref <18)

## 2021-06-26 MED ORDER — QUETIAPINE FUMARATE 25 MG PO TABS
200.0000 mg | ORAL_TABLET | Freq: Every day | ORAL | Status: DC
  Administered 2021-06-26: 23:00:00 200 mg via ORAL
  Filled 2021-06-26: qty 8

## 2021-06-26 MED ORDER — METOPROLOL TARTRATE 5 MG/5ML IV SOLN: 5.0000 mg | Freq: Four times a day (QID) | INTRAVENOUS | Status: DC | PRN

## 2021-06-26 MED ORDER — THIAMINE HCL 100 MG/ML IJ SOLN
100.0000 mg | Freq: Every day | INTRAMUSCULAR | Status: DC
  Filled 2021-06-26 (×4): qty 2

## 2021-06-26 MED ORDER — FOLIC ACID 1 MG PO TABS
1.0000 mg | ORAL_TABLET | Freq: Every day | ORAL | Status: DC
  Administered 2021-06-26 – 2021-07-04 (×9): 1 mg via ORAL
  Filled 2021-06-26 (×9): qty 1

## 2021-06-26 MED ORDER — LORAZEPAM 2 MG/ML IJ SOLN
1.0000 mg | INTRAMUSCULAR | Status: AC | PRN
  Administered 2021-06-28: 2 mg via INTRAVENOUS

## 2021-06-26 MED ORDER — ONDANSETRON HCL 4 MG/2ML IJ SOLN
4.0000 mg | Freq: Four times a day (QID) | INTRAMUSCULAR | Status: DC | PRN
  Administered 2021-06-26 – 2021-06-30 (×3): 4 mg via INTRAVENOUS
  Filled 2021-06-26 (×3): qty 2

## 2021-06-26 MED ORDER — DIVALPROEX SODIUM 500 MG PO DR TAB
500.0000 mg | DELAYED_RELEASE_TABLET | Freq: Two times a day (BID) | ORAL | Status: DC
  Administered 2021-06-26 – 2021-07-03 (×15): 500 mg via ORAL
  Filled 2021-06-26 (×16): qty 1

## 2021-06-26 MED ORDER — LORAZEPAM 1 MG PO TABS
1.0000 mg | ORAL_TABLET | ORAL | Status: AC | PRN
  Administered 2021-06-26: 1 mg via ORAL
  Administered 2021-06-27: 2 mg via ORAL
  Filled 2021-06-26: qty 1
  Filled 2021-06-26: qty 2

## 2021-06-26 MED ORDER — ACETAMINOPHEN 325 MG PO TABS
650.0000 mg | ORAL_TABLET | ORAL | Status: DC | PRN
  Administered 2021-06-26 – 2021-07-02 (×6): 650 mg via ORAL
  Filled 2021-06-26 (×6): qty 2

## 2021-06-26 MED ORDER — BUSPIRONE HCL 10 MG PO TABS
10.0000 mg | ORAL_TABLET | Freq: Two times a day (BID) | ORAL | Status: DC
  Administered 2021-06-26 – 2021-07-03 (×15): 10 mg via ORAL
  Filled 2021-06-26 (×4): qty 1
  Filled 2021-06-26: qty 2
  Filled 2021-06-26 (×11): qty 1

## 2021-06-26 MED ORDER — THIAMINE HCL 100 MG PO TABS
100.0000 mg | ORAL_TABLET | Freq: Every day | ORAL | Status: DC
  Administered 2021-06-26 – 2021-07-04 (×9): 100 mg via ORAL
  Filled 2021-06-26 (×9): qty 1

## 2021-06-26 MED ORDER — ADULT MULTIVITAMIN W/MINERALS CH
1.0000 | ORAL_TABLET | Freq: Every day | ORAL | Status: DC
  Administered 2021-06-26 – 2021-07-04 (×9): 1 via ORAL
  Filled 2021-06-26 (×9): qty 1

## 2021-06-26 MED ORDER — FLUOXETINE HCL 20 MG PO CAPS
20.0000 mg | ORAL_CAPSULE | Freq: Every day | ORAL | Status: DC
  Administered 2021-06-26 – 2021-07-04 (×9): 20 mg via ORAL
  Filled 2021-06-26 (×9): qty 1

## 2021-06-26 MED ORDER — THIAMINE HCL 100 MG/ML IJ SOLN
Freq: Once | INTRAVENOUS | Status: AC
  Filled 2021-06-26: qty 1000

## 2021-06-26 MED ORDER — LEVOTHYROXINE SODIUM 25 MCG PO TABS
25.0000 ug | ORAL_TABLET | Freq: Every day | ORAL | Status: DC
  Administered 2021-06-26 – 2021-07-02 (×7): 25 ug via ORAL
  Filled 2021-06-26 (×7): qty 1

## 2021-06-26 MED ORDER — ENOXAPARIN SODIUM 60 MG/0.6ML IJ SOSY
0.5000 mg/kg | PREFILLED_SYRINGE | INTRAMUSCULAR | Status: DC
  Administered 2021-06-26 – 2021-07-04 (×9): 50 mg via SUBCUTANEOUS
  Filled 2021-06-26 (×9): qty 0.6

## 2021-06-26 MED ORDER — ONDANSETRON HCL 4 MG PO TABS
4.0000 mg | ORAL_TABLET | Freq: Four times a day (QID) | ORAL | Status: DC | PRN
  Administered 2021-06-30: 17:00:00 4 mg via ORAL
  Filled 2021-06-26: qty 1

## 2021-06-26 NOTE — Progress Notes (Signed)
PHARMACIST - PHYSICIAN COMMUNICATION  CONCERNING:  Enoxaparin (Lovenox) for DVT Prophylaxis    RECOMMENDATION: Patient was prescribed enoxaprin 40mg  q24 hours for VTE prophylaxis.   Filed Weights   06/25/21 1710  Weight: 99.6 kg (219 lb 8 oz)    Body mass index is 28.18 kg/m.  Estimated Creatinine Clearance: 109.7 mL/min (by C-G formula based on SCr of 1.05 mg/dL).   Based on [facility] policy patient is candidate for enoxaparin 0.5mg /kg TBW SQ every 24 hours based on BMI being >30.  DESCRIPTION: Pharmacy has adjusted enoxaparin dose per [facility] policy.  Patient is now receiving enoxaparin 0.5 mg/kg every 24 hours    [facility], PharmD, [facility] 06/26/2021 4:55 AM

## 2021-06-26 NOTE — ED Notes (Signed)
ED tech on way to bring pt to floor.

## 2021-06-26 NOTE — ED Notes (Signed)
Provided toothbrush and toothpaste to pt.

## 2021-06-26 NOTE — Progress Notes (Signed)
PROGRESS NOTE    Curtis Cobb  AST:[id number] DOB: 11-08-1971 DOA: 06/25/2021 PCP: Patient, No Pcp Per (Inactive)    Brief Narrative:  50 y.o. male with medical history significant of alcohol abuse and dependence, anxiety disorder, schizoaffective disorder, hypertension, polysubstance abuse including narcotics and tobacco, cannabis abuse, who presented to the ER with intractable nausea vomiting abdominal pain.  Patient is a chronic alcoholic who endorses consumption of 1/5 of liquor a day.  He has been drinking for many years.  He states he has withdrawn before but has not been hospitalized for it.  He has interest in stopping drinking.  He was initiated on withdrawal protocol, IV fluids, antiemetics in the ER with good result.   Assessment & Plan:   Principal Problem:   Alcohol withdrawal (HCC) Active Problems:   Schizoaffective disorder, depressive type (HCC)   Alcohol use disorder, severe, dependence (HCC)   Cannabis use disorder, moderate, dependence (HCC)   HTN (hypertension)   Rheumatoid arthritis (HCC)   Opioid use disorder, mild, in sustained remission (HCC)   MDD (major depressive disorder), recurrent episode, severe (HCC)   Tobacco use disorder   Hypothyroidism   Hyponatremia  Intractable nausea and vomiting Suspected alcohol withdrawal Suspected alcohol gastritis Patient is a chronic alcoholic Drinks 1/5 of liquor a day Has history of previous withdrawals, not requiring hospitalization 2 days since last drink Plan: Continue on CIWA protocol, monitor carefully Patient is high risk for withdrawals given quantity and duration of alcohol consumption Intravenous fluids As needed antiemetics PPI and sucralfate TOC consult for substance abuse resources  Schizoaffective disorder Patient nonadherent with medications due to cost Home medications include Seroquel and fluoxetine Continue these medications  Essential hypertension No medications at  home Beta-blocker initiated  Generalized anxiety disorder Prescribed home BuSpar, not taking due to cost Will resume here for anxiety symptom control  Depression Has not been taking amitriptyline and Prozac due to cost Prozac reinitiated here Elavil held  History of rheumatoid arthritis Does not appear to be on any treatment No indication of exacerbation  Hypothyroid Resumed home Synthroid which patient has not been taking  Hyponatremia Likely secondary to alcohol intake  Polysubstance abuse Counseled patient Offer nicotine patch TOC consult for substance abuse resources    DVT prophylaxis: SQ Lovenox Code Status: Full code Family Communication: None today Disposition Plan:Status is: Observation  The patient will require care spanning > 2 midnights and should be moved to inpatient because: Inpatient level of care appropriate due to severity of illness  Dispo: The patient is from: Home              Anticipated d/c is to: Home              Patient currently is not medically stable to d/c.   Difficult to place patient No  I suspect the patient is high risk for severe withdrawals.  He is interested in alcohol cessation.     Level of care: Med-Surg  Consultants:  None  Procedures:  None  Antimicrobials:  None   Subjective: Seen and examined.  Reports improvement in abdominal pain and nausea symptoms since admission.  Still reports poor appetite.   Objective: Vitals:   06/26/21 1030 06/26/21 1038 06/26/21 1100 06/26/21 1200  BP: (!) 138/92 (!) 138/92 117/84   Pulse: 87 (!) 101 89 80  Resp: 17  18 18   Temp:      TempSrc:      SpO2: 92%  90% 93%  Weight:  Height:        Intake/Output Summary (Last 24 hours) at 06/26/2021 1235 Last data filed at 06/26/2021 0843 Gross per 24 hour  Intake 309.63 ml  Output 475 ml  Net -165.37 ml   Filed Weights   06/25/21 1710  Weight: 99.6 kg    Examination:  General exam: Appears calm and comfortable   Respiratory system: Clear to auscultation. Respiratory effort normal. Cardiovascular system: Tachycardic, regular rate and rhythm, no murmurs  gastrointestinal system: Soft, nondistended, mild tender to palpation epigastrium, normal bowel sounds Central nervous system: Alert and oriented x3.  No focal deficits.  Coarse tremor noted in upper extremities Extremities: Symmetric 5 x 5 power. Skin: No rashes, lesions or ulcers Psychiatry: Judgement and insight appear normal. Mood & affect appropriate.     Data Reviewed: I have personally reviewed following labs and imaging studies  CBC: Recent Labs  Lab 06/25/21 1716 06/26/21 0657  WBC 10.8* 6.5  NEUTROABS 8.6*  --   HGB 15.4 12.8*  HCT 44.8 38.3*  MCV 85.2 84.9  PLT 301 237   Basic Metabolic Panel: Recent Labs  Lab 06/25/21 1716 06/26/21 0657  NA 132* 135  K 3.5 3.1*  CL 95* 98  CO2 22 27  GLUCOSE 123* 94  BUN 9 6  CREATININE 1.05 0.89  CALCIUM 8.9 8.0*  MG  --  1.8  PHOS  --  4.2   GFR: Estimated Creatinine Clearance: 129.5 mL/min (by C-G formula based on SCr of 0.89 mg/dL). Liver Function Tests: Recent Labs  Lab 06/25/21 1716 06/26/21 0657  AST 161* 124*  ALT 78* 67*  ALKPHOS 91 71  BILITOT 1.7* 1.4*  PROT 8.4* 6.8  ALBUMIN 4.0 3.1*   Recent Labs  Lab 06/25/21 1716  LIPASE 60*   No results for input(s): AMMONIA in the last 168 hours. Coagulation Profile: No results for input(s): INR, PROTIME in the last 168 hours. Cardiac Enzymes: No results for input(s): CKTOTAL, CKMB, CKMBINDEX, TROPONINI in the last 168 hours. BNP (last 3 results) No results for input(s): PROBNP in the last 8760 hours. HbA1C: No results for input(s): HGBA1C in the last 72 hours. CBG: No results for input(s): GLUCAP in the last 168 hours. Lipid Profile: No results for input(s): CHOL, HDL, LDLCALC, TRIG, CHOLHDL, LDLDIRECT in the last 72 hours. Thyroid Function Tests: No results for input(s): TSH, T4TOTAL, FREET4, T3FREE,  THYROIDAB in the last 72 hours. Anemia Panel: No results for input(s): VITAMINB12, FOLATE, FERRITIN, TIBC, IRON, RETICCTPCT in the last 72 hours. Sepsis Labs: No results for input(s): PROCALCITON, LATICACIDVEN in the last 168 hours.  No results found for this or any previous visit (from the past 240 hour(s)).       Radiology Studies: CT ABDOMEN PELVIS W CONTRAST  Result Date: 06/25/2021 CLINICAL DATA:  Abdominal pain with nausea and vomiting for 8 days EXAM: CT ABDOMEN AND PELVIS WITH CONTRAST TECHNIQUE: Multidetector CT imaging of the abdomen and pelvis was performed using the standard protocol following bolus administration of intravenous contrast. CONTRAST:  OMNIPAQUE IOHEXOL 300 MG/ML  SOLN COMPARISON:  03/25/2016 FINDINGS: Lower chest: Small type 1 hiatal hernia. Adjacent 0.8 cm paraesophageal lymph node on image 15 series 2, formerly 0.7 cm. Hepatobiliary: Diffuse hepatic steatosis.  Otherwise unremarkable. Pancreas: Borderline prominence of caliber in the dorsal pancreatic duct in the pancreatic head and body but not in the pancreatic tail. This is a similar appearance to the 03/25/2016 exam. Spleen: Unremarkable Adrenals/Urinary Tract: Small hypodense right renal lesions are statistically  likely to be small cysts although technically nonspecific due to small size. Adrenal glands normal. Stomach/Bowel: Small type 1 hiatal hernia. Sigmoid colon diverticulosis. Vascular/Lymphatic: Right external iliac node borderline prominent at 1.0 cm in short axis on image 82 series 2, formerly the same. Likewise there is a stable 1.0 cm left external iliac lymph node on image 80 series 2. Reproductive: Unremarkable Other: No supplemental non-categorized findings. 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 Lower lumbar spondylosis and degenerative disc disease causing left foraminal impingement at L4-5 and bilateral foraminal impingement  at L5-S1. IMPRESSION: 1. A specific cause for the patient's recent abdominal pain with nausea and vomiting is not identified. 2. Other imaging findings of potential clinical significance: Small type 1 hiatal hernia. Diffuse hepatic steatosis. Chronic borderline prominence of the dorsal pancreatic duct in the pancreatic head and body. Sigmoid colon diverticulosis. Impingement at L4-5 and L5-S1. Electronically Signed   By: Gaylyn Rong M.D.   On: 06/25/2021 18:48   US ABDOMEN LIMITED RUQ (LIVER/GB)  Result Date: 06/25/2021 CLINICAL DATA:  Right upper quadrant pain with nausea and vomiting x8 days. EXAM: ULTRASOUND ABDOMEN LIMITED RIGHT UPPER QUADRANT COMPARISON:  None. FINDINGS: Gallbladder: Echogenic sludge is seen within the gallbladder lumen. No gallstones or wall thickening visualized (2.5 mm). No sonographic Murphy sign noted by sonographer. Common bile duct: Diameter: 5.4 mm Liver: No focal lesion identified. Heterogeneous, diffusely increased echogenicity of the liver parenchyma is seen. Portal vein is patent on color Doppler imaging with normal direction of blood flow towards the liver. Other: None. IMPRESSION: 1. Gallbladder sludge without evidence of cholelithiasis or acute cholecystitis. 2. Fatty liver. Electronically Signed   By: Aram Candela M.D.   On: 06/25/2021 22:42        Scheduled Meds:  divalproex  500 mg Oral Q12H   enoxaparin (LOVENOX) injection  0.5 mg/kg Subcutaneous Q24H   FLUoxetine  20 mg Oral Daily   folic acid  1 mg Oral Daily   levothyroxine  25 mcg Oral QAC breakfast   LORazepam  0-4 mg Intravenous Q6H   Or   LORazepam  0-4 mg Oral Q6H   [START ON 06/28/2021] LORazepam  0-4 mg Intravenous Q12H   Or   [START ON 06/28/2021] LORazepam  0-4 mg Oral Q12H   multivitamin with minerals  1 tablet Oral Daily   nicotine  21 mg Transdermal Daily   QUEtiapine  200 mg Oral QHS   thiamine  100 mg Oral Daily   Or   thiamine  100 mg Intravenous Daily   Continuous  Infusions:   LOS: 0 days    Time spent: 35 minutes    Tresa Moore, MD [facility] Pager [phone]  If 7PM-7AM, please contact night-coverage  06/26/2021, 12:35 PM

## 2021-06-27 MED ORDER — CHLORDIAZEPOXIDE HCL 5 MG PO CAPS
5.0000 mg | ORAL_CAPSULE | Freq: Three times a day (TID) | ORAL | Status: DC
  Administered 2021-06-27 – 2021-06-30 (×12): 5 mg via ORAL
  Filled 2021-06-27 (×12): qty 1

## 2021-06-27 NOTE — Progress Notes (Signed)
Nutrition Brief Note  Patient identified on the Malnutrition Screening Tool (MST) Report  Wt Readings from Last 15 Encounters:  06/25/21 99.6 kg  05/12/21 90.7 kg  03/28/21 90.7 kg  01/28/19 81.6 kg  11/13/18 81.6 kg  11/11/18 81.6 kg  10/08/18 89.4 kg  08/23/18 88.7 kg  02/23/17 104.3 kg  02/23/17 103.4 kg  01/17/17 106.6 kg  12/20/16 104.3 kg  11/15/16 104.3 kg  10/19/16 104.3 kg  10/10/16 104.3 kg   Pt resting in bed at the time of assessment. Inquired about weight loss and poor appetite reported on admission. Pt reports that he has been eating well today and that prior to his acute illness he was eating well as well. 8 days of poor intake due to n/v and abdominal pain. MD suspects gastroparesis due to EtOH abuse. Discussed typical diet, variety of foods with fruits and vegetables are eaten daily.  Body mass index is 28.18 kg/m. Patient meets criteria for overweight based on current BMI.   Current diet order is GI soft, patient is consuming approximately 100% of meals at this time. Labs and medications reviewed.   No nutrition interventions warranted at this time. If nutrition issues arise, please consult RD.   Greig Castilla, RD, LDN Clinical Dietitian Pager on Amion

## 2021-06-27 NOTE — Progress Notes (Signed)
PROGRESS NOTE    Curtis Cobb  UVO:[id number] DOB: [birth year] DOA: 06/25/2021 PCP: Patient, No Pcp Per (Inactive)    Brief Narrative:  50 y.o. male with medical history significant of alcohol abuse and dependence, anxiety disorder, schizoaffective disorder, hypertension, polysubstance abuse including narcotics and tobacco, cannabis abuse, who presented to the ER with intractable nausea vomiting abdominal pain.  Patient is a chronic alcoholic who endorses consumption of 1/5 of liquor a day.  He has been drinking for many years.  He states he has withdrawn before but has not been hospitalized for it.  He has interest in stopping drinking.  He was initiated on withdrawal protocol, IV fluids, antiemetics in the ER with good result.   Assessment & Plan:   Principal Problem:   Alcohol withdrawal (HCC) Active Problems:   Schizoaffective disorder, depressive type (HCC)   Alcohol use disorder, severe, dependence (HCC)   Cannabis use disorder, moderate, dependence (HCC)   HTN (hypertension)   Rheumatoid arthritis (HCC)   Opioid use disorder, mild, in sustained remission (HCC)   MDD (major depressive disorder), recurrent episode, severe (HCC)   Tobacco use disorder   Hypothyroidism   Hyponatremia  Intractable nausea and vomiting Acute alcohol withdrawal Suspected alcohol gastritis Patient is a chronic alcoholic Drinks 1/5 of liquor a day Has history of previous withdrawals, not requiring hospitalization 2 days since last drink Is currently exhibiting some mild symptoms of withdrawal including tremulousness, lethargy, slightly altered mentation Plan: DC Seroquel Start Librium 5 mg p.o. 3 times daily Continue CIWA with as needed Ativan Intravenous fluids As needed antiemetics PPI and sucralfate TOC consult for substance abuse resources  Schizoaffective disorder Patient nonadherent with medications due to cost Home medications include Seroquel and fluoxetine Seroquel is  being held Fluoxetine resumed  Essential hypertension No medications at home Beta-blocker initiated  Generalized anxiety disorder Prescribed home BuSpar, not taking due to cost Will resume here for anxiety symptom control  Depression Has not been taking amitriptyline and Prozac due to cost Prozac reinitiated here Elavil held  History of rheumatoid arthritis Does not appear to be on any treatment No indication of exacerbation  Hypothyroid Resumed home Synthroid which patient has not been taking  Hyponatremia Likely secondary to alcohol intake  Polysubstance abuse Counseled patient Offer nicotine patch TOC consult for substance abuse resources    DVT prophylaxis: SQ Lovenox Code Status: Full code Family Communication: None today.  Offered, patient declined Disposition Plan:Status is: Observation  The patient will require care spanning > 2 midnights and should be moved to inpatient because: Inpatient level of care appropriate due to severity of illness  Dispo: The patient is from: Home              Anticipated d/c is to: Home              Patient currently is not medically stable to d/c.   Difficult to place patient No  Remains medically appropriate for continued inpatient management.  Acute alcohol withdrawal     Level of care: Med-Surg  Consultants:  None  Procedures:  None  Antimicrobials:  None   Subjective: Patient seen and examined.  Endorses some excessive sleepiness and altered mentation he attributes to Seroquel.  Slightly tremulous.  No pain complaints  Objective: Vitals:   06/26/21 1720 06/26/21 1955 06/26/21 2354 06/27/21 0424  BP: (!) 138/97 140/90 124/85 119/87  Pulse: (!) 105 91 97 99  Resp: 18 16 17 18   Temp: 98 F (36.7 C) 98.3 F (  36.8 C) 97.8 F (36.6 C) 97.8 F (36.6 C)  TempSrc:      SpO2: 99% 98% 97% 100%  Weight:      Height:        Intake/Output Summary (Last 24 hours) at 06/27/2021 1044 Last data filed at 06/27/2021  1013 Gross per 24 hour  Intake 1020 ml  Output 1675 ml  Net -655 ml   Filed Weights   06/25/21 1710  Weight: 99.6 kg    Examination:  General exam: Mildly tremulous Respiratory system: Lungs clear.  Normal work of breathing.  Room air Cardiovascular system: S1-S2, regular rate and rhythm, no murmurs gastrointestinal system: Soft, nondistended, mild tender to palpation epigastrium, normal bowel sounds Central nervous system: Alert and oriented x3.  No focal deficits.  Coarse tremor noted in upper extremities Extremities: Symmetric 5 x 5 power. Skin: No rashes, lesions or ulcers Psychiatry: Judgement and insight appear normal. Mood & affect appropriate.     Data Reviewed: I have personally reviewed following labs and imaging studies  CBC: Recent Labs  Lab 06/25/21 1716 06/26/21 0657  WBC 10.8* 6.5  NEUTROABS 8.6*  --   HGB 15.4 12.8*  HCT 44.8 38.3*  MCV 85.2 84.9  PLT 301 237   Basic Metabolic Panel: Recent Labs  Lab 06/25/21 1716 06/26/21 0657  NA 132* 135  K 3.5 3.1*  CL 95* 98  CO2 22 27  GLUCOSE 123* 94  BUN 9 6  CREATININE 1.05 0.89  CALCIUM 8.9 8.0*  MG  --  1.8  PHOS  --  4.2   GFR: Estimated Creatinine Clearance: 129.5 mL/min (by C-G formula based on SCr of 0.89 mg/dL). Liver Function Tests: Recent Labs  Lab 06/25/21 1716 06/26/21 0657  AST 161* 124*  ALT 78* 67*  ALKPHOS 91 71  BILITOT 1.7* 1.4*  PROT 8.4* 6.8  ALBUMIN 4.0 3.1*   Recent Labs  Lab 06/25/21 1716  LIPASE 60*   No results for input(s): AMMONIA in the last 168 hours. Coagulation Profile: No results for input(s): INR, PROTIME in the last 168 hours. Cardiac Enzymes: No results for input(s): CKTOTAL, CKMB, CKMBINDEX, TROPONINI in the last 168 hours. BNP (last 3 results) No results for input(s): PROBNP in the last 8760 hours. HbA1C: No results for input(s): HGBA1C in the last 72 hours. CBG: No results for input(s): GLUCAP in the last 168 hours. Lipid Profile: No  results for input(s): CHOL, HDL, LDLCALC, TRIG, CHOLHDL, LDLDIRECT in the last 72 hours. Thyroid Function Tests: No results for input(s): TSH, T4TOTAL, FREET4, T3FREE, THYROIDAB in the last 72 hours. Anemia Panel: No results for input(s): VITAMINB12, FOLATE, FERRITIN, TIBC, IRON, RETICCTPCT in the last 72 hours. Sepsis Labs: No results for input(s): PROCALCITON, LATICACIDVEN in the last 168 hours.  No results found for this or any previous visit (from the past 240 hour(s)).       Radiology Studies: CT ABDOMEN PELVIS W CONTRAST  Result Date: 06/25/2021 CLINICAL DATA:  Abdominal pain with nausea and vomiting for 8 days EXAM: CT ABDOMEN AND PELVIS WITH CONTRAST TECHNIQUE: Multidetector CT imaging of the abdomen and pelvis was performed using the standard protocol following bolus administration of intravenous contrast. CONTRAST:  OMNIPAQUE IOHEXOL 300 MG/ML  SOLN COMPARISON:  03/25/2016 FINDINGS: Lower chest: Small type 1 hiatal hernia. Adjacent 0.8 cm paraesophageal lymph node on image 15 series 2, formerly 0.7 cm. Hepatobiliary: Diffuse hepatic steatosis.  Otherwise unremarkable. Pancreas: Borderline prominence of caliber in the dorsal pancreatic duct in the pancreatic  head and body but not in the pancreatic tail. This is a similar appearance to the 03/25/2016 exam. Spleen: Unremarkable Adrenals/Urinary Tract: Small hypodense right renal lesions are statistically likely to be small cysts although technically nonspecific due to small size. Adrenal glands normal. Stomach/Bowel: Small type 1 hiatal hernia. Sigmoid colon diverticulosis. Vascular/Lymphatic: Right external iliac node borderline prominent at 1.0 cm in short axis on image 82 series 2, formerly the same. Likewise there is a stable 1.0 cm left external iliac lymph node on image 80 series 2. Reproductive: Unremarkable Other: No supplemental non-categorized findings. 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 Lower lumbar spondylosis and degenerative disc disease causing left foraminal impingement at L4-5 and bilateral foraminal impingement at L5-S1. IMPRESSION: 1. A specific cause for the patient's recent abdominal pain with nausea and vomiting is not identified. 2. Other imaging findings of potential clinical significance: Small type 1 hiatal hernia. Diffuse hepatic steatosis. Chronic borderline prominence of the dorsal pancreatic duct in the pancreatic head and body. Sigmoid colon diverticulosis. Impingement at L4-5 and L5-S1. Electronically Signed   By: Gaylyn Rong M.D.   On: 06/25/2021 18:48   US ABDOMEN LIMITED RUQ (LIVER/GB)  Result Date: 06/25/2021 CLINICAL DATA:  Right upper quadrant pain with nausea and vomiting x8 days. EXAM: ULTRASOUND ABDOMEN LIMITED RIGHT UPPER QUADRANT COMPARISON:  None. FINDINGS: Gallbladder: Echogenic sludge is seen within the gallbladder lumen. No gallstones or wall thickening visualized (2.5 mm). No sonographic Murphy sign noted by sonographer. Common bile duct: Diameter: 5.4 mm Liver: No focal lesion identified. Heterogeneous, diffusely increased echogenicity of the liver parenchyma is seen. Portal vein is patent on color Doppler imaging with normal direction of blood flow towards the liver. Other: None. IMPRESSION: 1. Gallbladder sludge without evidence of cholelithiasis or acute cholecystitis. 2. Fatty liver. Electronically Signed   By: Aram Candela M.D.   On: 06/25/2021 22:42        Scheduled Meds:  busPIRone  10 mg Oral BID   chlordiazePOXIDE  5 mg Oral TID   divalproex  500 mg Oral Q12H   enoxaparin (LOVENOX) injection  0.5 mg/kg Subcutaneous Q24H   FLUoxetine  20 mg Oral Daily   folic acid  1 mg Oral Daily   levothyroxine  25 mcg Oral QAC breakfast   LORazepam  0-4 mg Intravenous Q6H   Or   LORazepam  0-4 mg Oral Q6H   [START ON 06/28/2021] LORazepam  0-4 mg Intravenous Q12H   Or   [START ON 06/28/2021]  LORazepam  0-4 mg Oral Q12H   multivitamin with minerals  1 tablet Oral Daily   nicotine  21 mg Transdermal Daily   thiamine  100 mg Oral Daily   Or   thiamine  100 mg Intravenous Daily   Continuous Infusions:   LOS: 0 days    Time spent: 25 minutes    Tresa Moore, MD [facility] Pager [phone]  If 7PM-7AM, please contact night-coverage  06/27/2021, 10:44 AM

## 2021-06-27 NOTE — Progress Notes (Signed)
Pt refuses to wear telemetry box, MD Sreenath made aware.

## 2021-06-27 NOTE — TOC Initial Note (Signed)
Transition of Care [facility]) - Initial/Assessment Note    Patient Details  Name: Curtis Cobb MRN: [id number] Date of Birth: 12/02/1971  [facility]) CM/SW Contact:    Shelbie Hutching, RN Phone Number: 06/27/2021, 12:36 PM  Clinical Narrative:                 Patient admitted to the hospital with alcohol withdrawal.  TOC consult for substance abuse resources.  RNCM met with patient at the bedside.  Patient is very pleasant and engaged in conversation.  Patient would like to get treatment for his alcohol use and is interested in resources.  List of outpatient and residential treatment centers in [place] provided to patient.  Encouraged patient to call and speak with the facilities to see which program would suit him best.   RNCM also provided patient with [facility] and referral made.  Patient will need PCP follow up.   Patient reports going to [facility] in the past, encouraged patient to call and get set up with them again.    Expected Discharge Plan: Home/Self Care Barriers to Discharge: Continued Medical Work up   Patient Goals and CMS Choice Patient states their goals for this hospitalization and ongoing recovery are:: To go for substance abuse treatment rehab      Expected Discharge Plan and Services Expected Discharge Plan: Home/Self Care   Discharge Planning Services: CM Consult, Medication Assistance, [facility]   Living arrangements for the past 2 months: Single Family Home                 DME Arranged: N/A         HH Arranged: NA [place] Agency: NA        Prior Living Arrangements/Services Living arrangements for the past 2 months: Single Family Home Lives with:: Self Patient language and need for interpreter reviewed:: Yes Do you feel safe going back to the place where you live?: Yes      Need for Family Participation in Patient Care: No (Comment) Care giver support system in place?: No (comment)   Criminal Activity/Legal  Involvement Pertinent to Current Situation/Hospitalization: No - Comment as needed  Activities of Daily Living Home Assistive Devices/Equipment: None ADL Screening (condition at time of admission) Patient's cognitive ability adequate to safely complete daily activities?: Yes Is the patient deaf or have difficulty hearing?: No Does the patient have difficulty seeing, even when wearing glasses/contacts?: No Does the patient have difficulty concentrating, remembering, or making decisions?: No Patient able to express need for assistance with ADLs?: Yes Does the patient have difficulty dressing or bathing?: No Independently performs ADLs?: Yes (appropriate for developmental age) Does the patient have difficulty walking or climbing stairs?: No Weakness of Legs: None Weakness of Arms/Hands: None  Permission Sought/Granted   Permission granted to share information with : No              Emotional Assessment Appearance:: Appears stated age Attitude/Demeanor/Rapport: Engaged Affect (typically observed): Accepting Orientation: : Oriented to Self, Oriented to Place, Oriented to  Time, Oriented to Situation Alcohol / Substance Use: Alcohol Use Psych Involvement: No (comment)  Admission diagnosis:  Alcohol withdrawal ([place]) [F10.239] RUQ pain [R10.11] Alcohol withdrawal syndrome without complication ([place]) [R42.706] Gastritis without bleeding, unspecified chronicity, unspecified gastritis type [K29.70] Intractable vomiting with nausea, unspecified vomiting type [R11.2] Patient Active Problem List   Diagnosis Date Noted   Alcohol withdrawal ([place]) 06/25/2021   Hyponatremia 06/25/2021   Hypothyroidism 10/17/2018  Tobacco use disorder 10/09/2018   Cellulitis 08/23/2018   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   Sedative, hypnotic or anxiolytic use disorder, severe, 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   MDD (major depressive disorder), recurrent episode, severe ([place]) 11/17/2015   Opioid use  disorder, mild, in sustained remission ([place]) 10/08/2015   Alcohol use disorder, severe, dependence ([place]) 10/07/2015   Cannabis use disorder, moderate, dependence ([place]) 10/07/2015   HTN (hypertension) 10/07/2015   Rheumatoid arthritis ([place]) 10/07/2015   Schizoaffective disorder, depressive type [facility])    PCP:  Patient, No Pcp Per (Inactive) Pharmacy:   [facility] [address], [place] [address] Phone: [phone] Fax: [phone]  [facility] [address] [place] [address] [place] [address] [place] [address] [place] [address] Phone: [phone] Fax: Rosa Sanchez [address]), [place])  [address] Phone: [phone] Fax: [phone]  [place] [address] Phone: [phone] Fax: [phone]     Social Determinants of Health (SDOH) Interventions    Readmission Risk Interventions No flowsheet data found.

## 2021-06-28 NOTE — Progress Notes (Signed)
PROGRESS NOTE    Curtis Cobb  EHU:[id number] DOB: [birth year] DOA: 06/25/2021 PCP: Patient, No Pcp Per (Inactive)    Brief Narrative:  50 y.o. male with medical history significant of alcohol abuse and dependence, anxiety disorder, schizoaffective disorder, hypertension, polysubstance abuse including narcotics and tobacco, cannabis abuse, who presented to the ER with intractable nausea vomiting abdominal pain.  Patient is a chronic alcoholic who endorses consumption of 1/5 of liquor a day.  He has been drinking for many years.  He states he has withdrawn before but has not been hospitalized for it.  He has interest in stopping drinking.  He was initiated on withdrawal protocol, IV fluids, antiemetics in the ER with good result.   Assessment & Plan:   Principal Problem:   Alcohol withdrawal (HCC) Active Problems:   Schizoaffective disorder, depressive type (HCC)   Alcohol use disorder, severe, dependence (HCC)   Cannabis use disorder, moderate, dependence (HCC)   HTN (hypertension)   Rheumatoid arthritis (HCC)   Opioid use disorder, mild, in sustained remission (HCC)   MDD (major depressive disorder), recurrent episode, severe (HCC)   Tobacco use disorder   Hypothyroidism   Hyponatremia  Intractable nausea and vomiting Acute alcohol withdrawal Suspected alcohol gastritis Patient is a chronic alcoholic Drinks 1/5 of liquor a day Has history of previous withdrawals, not requiring hospitalization 2 days since last drink Is currently exhibiting some mild symptoms of withdrawal including tremulousness, lethargy, slightly altered mentation Seroquel stopped 7/1 Plan: Continue Librium 5 mg p.o. 3 times daily Continue CIWA with as needed Ativan DC IVF As needed antiemetics PPI and sucralfate TOC consult for substance abuse resources  Schizoaffective disorder Patient nonadherent with medications due to cost Home medications include Seroquel and fluoxetine Seroquel is  being held Fluoxetine resumed  Essential hypertension No medications at home Beta-blocker initiated  Generalized anxiety disorder Prescribed home BuSpar, not taking due to cost Will resume here for anxiety symptom control  Depression Has not been taking amitriptyline and Prozac due to cost Prozac reinitiated here Elavil held  History of rheumatoid arthritis Does not appear to be on any treatment No indication of exacerbation  Hypothyroid Resumed home Synthroid which patient has not been taking  Hyponatremia Likely secondary to alcohol intake  Polysubstance abuse Counseled patient Offer nicotine patch TOC consult for substance abuse resources    DVT prophylaxis: SQ Lovenox Code Status: Full code Family Communication: None today.  Offered, patient declined Disposition Plan:Status is: Observation  The patient will require care spanning > 2 midnights and should be moved to inpatient because: Inpatient level of care appropriate due to severity of illness  Dispo: The patient is from: Home              Anticipated d/c is to: Home              Patient currently is not medically stable to d/c.   Difficult to place patient No  Remains medically appropriate for continued inpatient management.  Acute alcohol withdrawal     Level of care: Med-Surg  Consultants:  None  Procedures:  None  Antimicrobials:  None   Subjective: Patient seen and examined.  Tremulousness improved.  Patient still endorses some mental status changes and occasional disorientation.  No pain complaints  Objective: Vitals:   06/27/21 1533 06/28/21 0011 06/28/21 0607 06/28/21 0756  BP: 137/90 (!) 145/103 133/83 132/84  Pulse: 93 82 79 80  Resp: 18 16 16 16   Temp: 97.9 F (36.6 C) 97.8 F (36.6  C) 97.8 F (36.6 C) 97.8 F (36.6 C)  TempSrc:  Oral Oral Oral  SpO2: 99% 98% 98% 97%  Weight:      Height:        Intake/Output Summary (Last 24 hours) at 06/28/2021 1022 Last data filed at  06/27/2021 1812 Gross per 24 hour  Intake 240 ml  Output --  Net 240 ml   Filed Weights   06/25/21 1710  Weight: 99.6 kg    Examination:  General exam: Mildly tremulous Respiratory system: Lungs clear.  Normal work of breathing.  Room air Cardiovascular system: S1-S2, regular rate and rhythm, no murmurs gastrointestinal system: Soft, nondistended, mild tender to palpation epigastrium, normal bowel sounds Central nervous system: Alert and oriented x3.  No focal deficits.  Coarse tremor noted in upper extremities Extremities: Symmetric 5 x 5 power. Skin: No rashes, lesions or ulcers Psychiatry: Judgement and insight appear normal. Mood & affect appropriate.     Data Reviewed: I have personally reviewed following labs and imaging studies  CBC: Recent Labs  Lab 06/25/21 1716 06/26/21 0657  WBC 10.8* 6.5  NEUTROABS 8.6*  --   HGB 15.4 12.8*  HCT 44.8 38.3*  MCV 85.2 84.9  PLT 301 237   Basic Metabolic Panel: Recent Labs  Lab 06/25/21 1716 06/26/21 0657  NA 132* 135  K 3.5 3.1*  CL 95* 98  CO2 22 27  GLUCOSE 123* 94  BUN 9 6  CREATININE 1.05 0.89  CALCIUM 8.9 8.0*  MG  --  1.8  PHOS  --  4.2   GFR: Estimated Creatinine Clearance: 129.5 mL/min (by C-G formula based on SCr of 0.89 mg/dL). Liver Function Tests: Recent Labs  Lab 06/25/21 1716 06/26/21 0657  AST 161* 124*  ALT 78* 67*  ALKPHOS 91 71  BILITOT 1.7* 1.4*  PROT 8.4* 6.8  ALBUMIN 4.0 3.1*   Recent Labs  Lab 06/25/21 1716  LIPASE 60*   No results for input(s): AMMONIA in the last 168 hours. Coagulation Profile: No results for input(s): INR, PROTIME in the last 168 hours. Cardiac Enzymes: No results for input(s): CKTOTAL, CKMB, CKMBINDEX, TROPONINI in the last 168 hours. BNP (last 3 results) No results for input(s): PROBNP in the last 8760 hours. HbA1C: No results for input(s): HGBA1C in the last 72 hours. CBG: No results for input(s): GLUCAP in the last 168 hours. Lipid Profile: No  results for input(s): CHOL, HDL, LDLCALC, TRIG, CHOLHDL, LDLDIRECT in the last 72 hours. Thyroid Function Tests: No results for input(s): TSH, T4TOTAL, FREET4, T3FREE, THYROIDAB in the last 72 hours. Anemia Panel: No results for input(s): VITAMINB12, FOLATE, FERRITIN, TIBC, IRON, RETICCTPCT in the last 72 hours. Sepsis Labs: No results for input(s): PROCALCITON, LATICACIDVEN in the last 168 hours.  No results found for this or any previous visit (from the past 240 hour(s)).       Radiology Studies: No results found.      Scheduled Meds:  busPIRone  10 mg Oral BID   chlordiazePOXIDE  5 mg Oral TID   divalproex  500 mg Oral Q12H   enoxaparin (LOVENOX) injection  0.5 mg/kg Subcutaneous Q24H   FLUoxetine  20 mg Oral Daily   folic acid  1 mg Oral Daily   levothyroxine  25 mcg Oral QAC breakfast   LORazepam  0-4 mg Intravenous Q12H   Or   LORazepam  0-4 mg Oral Q12H   multivitamin with minerals  1 tablet Oral Daily   thiamine  100 mg Oral  Daily   Or   thiamine  100 mg Intravenous Daily   Continuous Infusions:   LOS: 1 day    Time spent: 15 minutes    Tresa Moore, MD [facility] Pager [phone]  If 7PM-7AM, please contact night-coverage  06/28/2021, 10:22 AM

## 2021-06-29 MED ORDER — METHOCARBAMOL 500 MG PO TABS
500.0000 mg | ORAL_TABLET | Freq: Four times a day (QID) | ORAL | Status: DC | PRN
  Administered 2021-06-29 – 2021-07-01 (×7): 500 mg via ORAL
  Filled 2021-06-29 (×9): qty 1

## 2021-06-29 NOTE — Progress Notes (Signed)
PROGRESS NOTE    Curtis Cobb  ZOX:[id number] DOB: Dec 25, 1971 DOA: 06/25/2021 PCP: Patient, No Pcp Per (Inactive)    Brief Narrative:  50 y.o. male with medical history significant of alcohol abuse and dependence, anxiety disorder, schizoaffective disorder, hypertension, polysubstance abuse including narcotics and tobacco, cannabis abuse, who presented to the ER with intractable nausea vomiting abdominal pain.  Patient is a chronic alcoholic who endorses consumption of 1/5 of liquor a day.  He has been drinking for many years.  He states he has withdrawn before but has not been hospitalized for it.  He has interest in stopping drinking.  He was initiated on withdrawal protocol, IV fluids, antiemetics in the ER with good result.  Still demonstrating some mild symptoms of withdrawal including tremulousness and diffuse body aches.   Assessment & Plan:   Principal Problem:   Alcohol withdrawal (HCC) Active Problems:   Schizoaffective disorder, depressive type (HCC)   Alcohol use disorder, severe, dependence (HCC)   Cannabis use disorder, moderate, dependence (HCC)   HTN (hypertension)   Rheumatoid arthritis (HCC)   Opioid use disorder, mild, in sustained remission (HCC)   MDD (major depressive disorder), recurrent episode, severe (HCC)   Tobacco use disorder   Hypothyroidism   Hyponatremia  Intractable nausea and vomiting Acute alcohol withdrawal Suspected alcohol gastritis Patient is a chronic alcoholic Drinks 1/5 of liquor a day Has history of previous withdrawals, not requiring hospitalization 2 days since last drink at time of presentation Is currently exhibiting some mild symptoms of withdrawal including tremulousness, lethargy, slightly altered mentation Seroquel stopped 7/1 Also complaining of body aches Plan: Continue Librium 5 mg p.o. 3 times daily, will start taper tomorrow Continue CIWA with as needed Ativan Add as needed Robaxin for body aches As needed  antiemetics PPI and sucralfate TOC consult for substance abuse resources  Schizoaffective disorder Patient nonadherent with medications due to cost Home medications include Seroquel and fluoxetine Seroquel is being held Patient endorses Seroquel was causing mental status changes Will recommend holding Seroquel on discharge Fluoxetine resumed  Essential hypertension No medications at home Beta-blocker initiated  Generalized anxiety disorder Prescribed home BuSpar, not taking due to cost Will resume here for anxiety symptom control  Depression Has not been taking amitriptyline and Prozac due to cost Prozac reinitiated here Elavil held  History of rheumatoid arthritis Does not appear to be on any treatment No indication of exacerbation  Hypothyroid Resumed home Synthroid which patient has not been taking  Hyponatremia Likely secondary to alcohol intake  Polysubstance abuse Counseled patient Offer nicotine patch TOC consult for substance abuse resources    DVT prophylaxis: SQ Lovenox Code Status: Full code Family Communication: None today.  Offered, patient declined Disposition Plan:Status is: Inpatient  Remains inpatient appropriate because:Inpatient level of care appropriate due to severity of illness  Dispo: The patient is from: Home              Anticipated d/c is to: Home              Patient currently is not medically stable to d/c.   Difficult to place patient No        Acute alcohol withdrawal syndrome     Level of care: Med-Surg  Consultants:  None  Procedures:  None  Antimicrobials:  None   Subjective: Patient seen and examined.  Vital signs stable.  Tremulousness consistent from prior.  Patient endorsing body aches this morning.  Mental status improved  Objective: Vitals:   06/28/21 1707  06/28/21 2013 06/29/21 0526 06/29/21 0734  BP: 121/80 130/81 139/87 129/87  Pulse: 84 81 75 77  Resp: 15 16 16 15   Temp: 98.4 F (36.9 C)  98.4 F (36.9 C) 98.3 F (36.8 C) 98.3 F (36.8 C)  TempSrc:  Oral Oral   SpO2: 99% 96% 99% 96%  Weight:      Height:        Intake/Output Summary (Last 24 hours) at 06/29/2021 1027 Last data filed at 06/29/2021 0829 Gross per 24 hour  Intake 840 ml  Output --  Net 840 ml   Filed Weights   06/25/21 1710  Weight: 99.6 kg    Examination:  General exam: Mildly tremulous.  Appears fatigued Respiratory system: Lungs clear.  Normal work of breathing.  Room air Cardiovascular system: S1-S2, regular rate and rhythm, no murmurs gastrointestinal system: Soft, nondistended, mild tender to palpation epigastrium, normal bowel sounds Central nervous system: Alert and oriented x3.  No focal deficits.  Fine tremor noted upper extremities extremities: Symmetric 5 x 5 power. Skin: No rashes, lesions or ulcers Psychiatry: Judgement and insight appear normal. Mood & affect appropriate.     Data Reviewed: I have personally reviewed following labs and imaging studies  CBC: Recent Labs  Lab 06/25/21 1716 06/26/21 0657  WBC 10.8* 6.5  NEUTROABS 8.6*  --   HGB 15.4 12.8*  HCT 44.8 38.3*  MCV 85.2 84.9  PLT 301 237   Basic Metabolic Panel: Recent Labs  Lab 06/25/21 1716 06/26/21 0657  NA 132* 135  K 3.5 3.1*  CL 95* 98  CO2 22 27  GLUCOSE 123* 94  BUN 9 6  CREATININE 1.05 0.89  CALCIUM 8.9 8.0*  MG  --  1.8  PHOS  --  4.2   GFR: Estimated Creatinine Clearance: 129.5 mL/min (by C-G formula based on SCr of 0.89 mg/dL). Liver Function Tests: Recent Labs  Lab 06/25/21 1716 06/26/21 0657  AST 161* 124*  ALT 78* 67*  ALKPHOS 91 71  BILITOT 1.7* 1.4*  PROT 8.4* 6.8  ALBUMIN 4.0 3.1*   Recent Labs  Lab 06/25/21 1716  LIPASE 60*   No results for input(s): AMMONIA in the last 168 hours. Coagulation Profile: No results for input(s): INR, PROTIME in the last 168 hours. Cardiac Enzymes: No results for input(s): CKTOTAL, CKMB, CKMBINDEX, TROPONINI in the last 168  hours. BNP (last 3 results) No results for input(s): PROBNP in the last 8760 hours. HbA1C: No results for input(s): HGBA1C in the last 72 hours. CBG: No results for input(s): GLUCAP in the last 168 hours. Lipid Profile: No results for input(s): CHOL, HDL, LDLCALC, TRIG, CHOLHDL, LDLDIRECT in the last 72 hours. Thyroid Function Tests: No results for input(s): TSH, T4TOTAL, FREET4, T3FREE, THYROIDAB in the last 72 hours. Anemia Panel: No results for input(s): VITAMINB12, FOLATE, FERRITIN, TIBC, IRON, RETICCTPCT in the last 72 hours. Sepsis Labs: No results for input(s): PROCALCITON, LATICACIDVEN in the last 168 hours.  No results found for this or any previous visit (from the past 240 hour(s)).       Radiology Studies: No results found.      Scheduled Meds:  busPIRone  10 mg Oral BID   chlordiazePOXIDE  5 mg Oral TID   divalproex  500 mg Oral Q12H   enoxaparin (LOVENOX) injection  0.5 mg/kg Subcutaneous Q24H   FLUoxetine  20 mg Oral Daily   folic acid  1 mg Oral Daily   levothyroxine  25 mcg Oral QAC breakfast  LORazepam  0-4 mg Intravenous Q12H   Or   LORazepam  0-4 mg Oral Q12H   multivitamin with minerals  1 tablet Oral Daily   thiamine  100 mg Oral Daily   Or   thiamine  100 mg Intravenous Daily   Continuous Infusions:   LOS: 2 days    Time spent: 25 minutes    Tresa Moore, MD [facility] Pager [phone]  If 7PM-7AM, please contact night-coverage  06/29/2021, 10:27 AM

## 2021-06-30 MED ORDER — TRAZODONE HCL 50 MG PO TABS
25.0000 mg | ORAL_TABLET | Freq: Every evening | ORAL | Status: DC | PRN
  Administered 2021-06-30 – 2021-07-01 (×2): 25 mg via ORAL
  Filled 2021-06-30: qty 1
  Filled 2021-06-30: qty 0.5

## 2021-06-30 MED ORDER — LORAZEPAM 2 MG/ML IJ SOLN
0.5000 mg | INTRAMUSCULAR | Status: DC | PRN
  Administered 2021-06-30 – 2021-07-01 (×5): 0.5 mg via INTRAVENOUS
  Filled 2021-06-30 (×5): qty 1

## 2021-06-30 NOTE — Progress Notes (Signed)
Pt with complaints of nausea, headache, just feeling as if his "body is turned inside out". MD notified awaiting orders.

## 2021-06-30 NOTE — Progress Notes (Signed)
PROGRESS NOTE    Lenford Beddow  ZOX:[id number] DOB: 01/26/1971 DOA: 06/25/2021 PCP: Patient, No Pcp Per (Inactive)    Brief Narrative:  50 y.o. male with medical history significant of alcohol abuse and dependence, anxiety disorder, schizoaffective disorder, hypertension, polysubstance abuse including narcotics and tobacco, cannabis abuse, who presented to the ER with intractable nausea vomiting abdominal pain.  Patient is a chronic alcoholic who endorses consumption of 1/5 of liquor a day.  He has been drinking for many years.  He states he has withdrawn before but has not been hospitalized for it.  He has interest in stopping drinking.  He was initiated on withdrawal protocol, IV fluids, antiemetics in the ER with good result.  Still demonstrating some mild symptoms of withdrawal including tremulousness and diffuse body aches.   Assessment & Plan:   Principal Problem:   Alcohol withdrawal (HCC) Active Problems:   Schizoaffective disorder, depressive type (HCC)   Alcohol use disorder, severe, dependence (HCC)   Cannabis use disorder, moderate, dependence (HCC)   HTN (hypertension)   Rheumatoid arthritis (HCC)   Opioid use disorder, mild, in sustained remission (HCC)   MDD (major depressive disorder), recurrent episode, severe (HCC)   Tobacco use disorder   Hypothyroidism   Hyponatremia  Intractable nausea and vomiting Acute alcohol withdrawal Suspected alcohol gastritis Patient is a chronic alcoholic Drinks 1/5 of liquor a day Has history of previous withdrawals, not requiring hospitalization 2 days since last drink at time of presentation Is currently exhibiting some mild symptoms of withdrawal including tremulousness, lethargy, slightly altered mentation Seroquel stopped 7/1 Also complaining of body aches, improved Plan: Continue Librium 5 mg p.o. 3 times daily Will likely recommend extended taper time of discharge Continue CIWA with as needed Ativan Add as  needed Robaxin for body aches As needed antiemetics PPI and sucralfate TOC consult for substance abuse resources Anticipate discharge home 07/01/2021  Schizoaffective disorder Patient nonadherent with medications due to cost Home medications include Seroquel and fluoxetine Seroquel is being held Patient endorses Seroquel was causing mental status changes Will recommend holding Seroquel on discharge Fluoxetine resumed  Essential hypertension No medications at home Beta-blocker initiated  Generalized anxiety disorder Prescribed home BuSpar, not taking due to cost Will resume here for anxiety symptom control  Depression Has not been taking amitriptyline and Prozac due to cost Prozac reinitiated here Elavil held  History of rheumatoid arthritis Does not appear to be on any treatment No indication of exacerbation  Hypothyroid Resumed home Synthroid which patient has not been taking  Hyponatremia Likely secondary to alcohol intake  Polysubstance abuse Counseled patient Offer nicotine patch TOC consult for substance abuse resources    DVT prophylaxis: SQ Lovenox Code Status: Full code Family Communication: None today.  Offered, patient declined Disposition Plan:Status is: Inpatient  Remains inpatient appropriate because:Inpatient level of care appropriate due to severity of illness  Dispo: The patient is from: Home              Anticipated d/c is to: Home              Patient currently is not medically stable to d/c.   Difficult to place patient No  Acute alcohol withdrawal.  Clinically appears to be stabilizing.  Anticipate discharge home 07/01/2021     Level of care: Med-Surg  Consultants:  None  Procedures:  None  Antimicrobials:  None   Subjective: Patient seen and examined.  Vitals remained stable.  Mental status at baseline.  Persistent fine tremors though  improving.  Objective: Vitals:   06/29/21 1946 06/29/21 2325 06/30/21 0435 06/30/21 0721   BP: 129/84 114/76 112/73 135/85  Pulse: 82 79 76 75  Resp: 16 16 16 14   Temp: 98.3 F (36.8 C) 98.4 F (36.9 C) 98.6 F (37 C) 98 F (36.7 C)  TempSrc:      SpO2: 98% 95% 97% 99%  Weight:      Height:        Intake/Output Summary (Last 24 hours) at 06/30/2021 1048 Last data filed at 06/29/2021 2130 Gross per 24 hour  Intake 960 ml  Output --  Net 960 ml   Filed Weights   06/25/21 1710  Weight: 99.6 kg    Examination:  General exam: Mildly tremulous.  Appears fatigued Respiratory system: Lungs clear.  Normal work of breathing.  Room air Cardiovascular system: S1-S2, regular rate and rhythm, no murmurs gastrointestinal system: Soft, nondistended, mild tender to palpation epigastrium, normal bowel sounds Central nervous system: Alert and oriented x3.  No focal deficits.  Fine tremor noted upper extremities extremities: Symmetric 5 x 5 power. Skin: No rashes, lesions or ulcers Psychiatry: Judgement and insight appear normal. Mood & affect appropriate.     Data Reviewed: I have personally reviewed following labs and imaging studies  CBC: Recent Labs  Lab 06/25/21 1716 06/26/21 0657  WBC 10.8* 6.5  NEUTROABS 8.6*  --   HGB 15.4 12.8*  HCT 44.8 38.3*  MCV 85.2 84.9  PLT 301 237   Basic Metabolic Panel: Recent Labs  Lab 06/25/21 1716 06/26/21 0657  NA 132* 135  K 3.5 3.1*  CL 95* 98  CO2 22 27  GLUCOSE 123* 94  BUN 9 6  CREATININE 1.05 0.89  CALCIUM 8.9 8.0*  MG  --  1.8  PHOS  --  4.2   GFR: Estimated Creatinine Clearance: 129.5 mL/min (by C-G formula based on SCr of 0.89 mg/dL). Liver Function Tests: Recent Labs  Lab 06/25/21 1716 06/26/21 0657  AST 161* 124*  ALT 78* 67*  ALKPHOS 91 71  BILITOT 1.7* 1.4*  PROT 8.4* 6.8  ALBUMIN 4.0 3.1*   Recent Labs  Lab 06/25/21 1716  LIPASE 60*   No results for input(s): AMMONIA in the last 168 hours. Coagulation Profile: No results for input(s): INR, PROTIME in the last 168 hours. Cardiac  Enzymes: No results for input(s): CKTOTAL, CKMB, CKMBINDEX, TROPONINI in the last 168 hours. BNP (last 3 results) No results for input(s): PROBNP in the last 8760 hours. HbA1C: No results for input(s): HGBA1C in the last 72 hours. CBG: No results for input(s): GLUCAP in the last 168 hours. Lipid Profile: No results for input(s): CHOL, HDL, LDLCALC, TRIG, CHOLHDL, LDLDIRECT in the last 72 hours. Thyroid Function Tests: No results for input(s): TSH, T4TOTAL, FREET4, T3FREE, THYROIDAB in the last 72 hours. Anemia Panel: No results for input(s): VITAMINB12, FOLATE, FERRITIN, TIBC, IRON, RETICCTPCT in the last 72 hours. Sepsis Labs: No results for input(s): PROCALCITON, LATICACIDVEN in the last 168 hours.  No results found for this or any previous visit (from the past 240 hour(s)).       Radiology Studies: No results found.      Scheduled Meds:  busPIRone  10 mg Oral BID   chlordiazePOXIDE  5 mg Oral TID   divalproex  500 mg Oral Q12H   enoxaparin (LOVENOX) injection  0.5 mg/kg Subcutaneous Q24H   FLUoxetine  20 mg Oral Daily   folic acid  1 mg Oral Daily  levothyroxine  25 mcg Oral QAC breakfast   multivitamin with minerals  1 tablet Oral Daily   thiamine  100 mg Oral Daily   Or   thiamine  100 mg Intravenous Daily   Continuous Infusions:   LOS: 3 days    Time spent: 15 minutes    Tresa Moore, MD [facility] Pager [phone]  If 7PM-7AM, please contact night-coverage  06/30/2021, 10:48 AM

## 2021-07-01 LAB — COMPREHENSIVE METABOLIC PANEL
ALT: 118 U/L — ABNORMAL HIGH (ref 0–44)
AST: 136 U/L — ABNORMAL HIGH (ref 15–41)
Albumin: 3.3 g/dL — ABNORMAL LOW (ref 3.5–5.0)
Alkaline Phosphatase: 60 U/L (ref 38–126)
Anion gap: 6 (ref 5–15)
BUN: 8 mg/dL (ref 6–20)
CO2: 27 mmol/L (ref 22–32)
Calcium: 8.8 mg/dL — ABNORMAL LOW (ref 8.9–10.3)
Chloride: 105 mmol/L (ref 98–111)
Creatinine, Ser: 0.94 mg/dL (ref 0.61–1.24)
GFR, Estimated: >60 mL/min (ref >60)
Glucose, Bld: 106 mg/dL — ABNORMAL HIGH (ref 70–99)
Potassium: 4.6 mmol/L (ref 3.5–5.1)
Sodium: 138 mmol/L (ref 135–145)
Total Bilirubin: 0.5 mg/dL (ref 0.3–1.2)
Total Protein: 7.2 g/dL (ref 6.5–8.1)

## 2021-07-01 LAB — CBC
HCT: 43.5 % (ref 39.0–52.0)
Hemoglobin: 13.8 g/dL (ref 13.0–17.0)
MCH: 28.9 pg (ref 26.0–34.0)
MCHC: 31.7 g/dL (ref 30.0–36.0)
MCV: 91 fL (ref 80.0–100.0)
Platelets: 219 10*3/uL (ref 150–400)
RBC: 4.78 MIL/uL (ref 4.22–5.81)
RDW: 16.6 % — ABNORMAL HIGH (ref 11.5–15.5)
WBC: 4.4 10*3/uL (ref 4.0–10.5)
nRBC: 0 % (ref 0.0–0.2)

## 2021-07-01 LAB — MAGNESIUM: Magnesium: 1.8 mg/dL (ref 1.7–2.4)

## 2021-07-01 LAB — PHOSPHORUS: Phosphorus: 3 mg/dL (ref 2.5–4.6)

## 2021-07-01 MED ORDER — THIAMINE HCL 100 MG PO TABS: 100.0000 mg | ORAL_TABLET | Freq: Every day | ORAL | Status: DC

## 2021-07-01 MED ORDER — THIAMINE HCL 100 MG/ML IJ SOLN: 100.0000 mg | Freq: Every day | INTRAMUSCULAR | Status: DC

## 2021-07-01 MED ORDER — LORAZEPAM 1 MG PO TABS
1.0000 mg | ORAL_TABLET | ORAL | Status: DC | PRN
  Administered 2021-07-01: 3 mg via ORAL
  Administered 2021-07-01: 2 mg via ORAL
  Administered 2021-07-01: 14:00:00 3 mg via ORAL
  Administered 2021-07-02: 4 mg via ORAL
  Administered 2021-07-02: 2 mg via ORAL
  Administered 2021-07-02: 3 mg via ORAL
  Administered 2021-07-02: 2 mg via ORAL
  Administered 2021-07-02: 4 mg via ORAL
  Administered 2021-07-03: 2 mg via ORAL
  Administered 2021-07-03: 04:00:00 4 mg via ORAL
  Administered 2021-07-03: 2 mg via ORAL
  Filled 2021-07-01: qty 3
  Filled 2021-07-01: qty 4
  Filled 2021-07-01: qty 3
  Filled 2021-07-01: qty 1
  Filled 2021-07-01 (×2): qty 2
  Filled 2021-07-01: qty 4
  Filled 2021-07-01: qty 2
  Filled 2021-07-01: qty 3
  Filled 2021-07-01: qty 2
  Filled 2021-07-01: qty 3
  Filled 2021-07-01: qty 2

## 2021-07-01 MED ORDER — FOLIC ACID 1 MG PO TABS
1.0000 mg | ORAL_TABLET | Freq: Every day | ORAL | Status: DC
Start: 2021-07-01 — End: 2021-07-01

## 2021-07-01 MED ORDER — LORAZEPAM 2 MG/ML IJ SOLN: 1.0000 mg | INTRAMUSCULAR | Status: DC | PRN

## 2021-07-01 MED ORDER — ADULT MULTIVITAMIN W/MINERALS CH
1.0000 | ORAL_TABLET | Freq: Every day | ORAL | Status: DC
Start: 2021-07-01 — End: 2021-07-01

## 2021-07-01 MED ORDER — CHLORDIAZEPOXIDE HCL 5 MG PO CAPS
10.0000 mg | ORAL_CAPSULE | Freq: Three times a day (TID) | ORAL | Status: DC
  Administered 2021-07-01 – 2021-07-02 (×4): 10 mg via ORAL
  Filled 2021-07-01 (×4): qty 2

## 2021-07-01 MED ORDER — LORAZEPAM 1 MG PO TABS: 1.0000 mg | ORAL_TABLET | ORAL | Status: DC | PRN

## 2021-07-01 MED ORDER — POTASSIUM CHLORIDE CRYS ER 20 MEQ PO TBCR
40.0000 meq | EXTENDED_RELEASE_TABLET | Freq: Once | ORAL | Status: AC
  Administered 2021-07-01: 09:00:00 40 meq via ORAL
  Filled 2021-07-01: qty 2

## 2021-07-01 NOTE — Progress Notes (Signed)
PROGRESS NOTE    [name]  TOI:[id number] DOB: 11/12/1971 DOA: 06/25/2021 PCP: Curtis Cobb, No Pcp Per (Inactive)    Brief Narrative:  50 y.o. male with medical history significant of alcohol abuse and dependence, anxiety disorder, schizoaffective disorder, hypertension, polysubstance abuse including narcotics and tobacco, cannabis abuse, who presented to the ER with intractable nausea vomiting abdominal pain.  Curtis Cobb is a chronic alcoholic who endorses consumption of 1/5 of liquor a day.  He has been drinking for many years.  He states he has withdrawn before but has not been hospitalized for it.  He has interest in stopping drinking.  He was initiated on withdrawal protocol, IV fluids, antiemetics in the ER with good result.  Still demonstrating some mild symptoms of withdrawal including tremulousness and diffuse body aches.   Assessment & Plan:   Principal Problem:   Alcohol withdrawal (HCC) Active Problems:   Schizoaffective disorder, depressive type (HCC)   Alcohol use disorder, severe, dependence (HCC)   Cannabis use disorder, moderate, dependence (HCC)   HTN (hypertension)   Rheumatoid arthritis (HCC)   Opioid use disorder, mild, in sustained remission (HCC)   MDD (major depressive disorder), recurrent episode, severe (HCC)   Tobacco use disorder   Hypothyroidism   Hyponatremia  Intractable nausea and vomiting Acute alcohol withdrawal Suspected alcohol gastritis Curtis Cobb is a chronic alcoholic Drinks 1/5 of liquor a day Has history of previous withdrawals, not requiring hospitalization 2 days since last drink at time of presentation Is currently exhibiting some mild symptoms of withdrawal including tremulousness, lethargy, slightly altered mentation Seroquel stopped 7/1 Also complaining of body aches, improved 7/5: Complains of nausea, headache, altered mentation.  Requiring fairly frequent Ativan Plan: Increase Librium 10 mg 3 times daily Will likely  recommend extended taper time of discharge Continue CIWA with as needed Ativan, minimize use of able Add as needed Robaxin for body aches As needed antiemetics PPI and sucralfate TOC consult for substance abuse resources Possible discharge home in 24 to 48 hours  Schizoaffective disorder Curtis Cobb nonadherent with medications due to cost Home medications include Seroquel and fluoxetine Seroquel is being held Curtis Cobb endorses Seroquel was causing mental status changes Will recommend holding Seroquel on discharge Fluoxetine resumed  Essential hypertension No medications at home Beta-blocker initiated  Generalized anxiety disorder Prescribed home BuSpar, not taking due to cost Will resume here for anxiety symptom control  Depression Has not been taking amitriptyline and Prozac due to cost Prozac reinitiated here Elavil held  History of rheumatoid arthritis Does not appear to be on any treatment No indication of exacerbation  Hypothyroid Resumed home Synthroid which Curtis Cobb has not been taking  Hyponatremia Likely secondary to alcohol intake  Polysubstance abuse Counseled Curtis Cobb Offer nicotine patch TOC consult for substance abuse resources    DVT prophylaxis: SQ Lovenox Code Status: Full code Family Communication: None today.  Offered, Curtis Cobb declined Disposition Plan:Status is: Inpatient  Remains inpatient appropriate because:Inpatient level of care appropriate due to severity of illness  Dispo: The Curtis Cobb is from: Home              Anticipated d/c is to: Home              Curtis Cobb currently is not medically stable to d/c.   Difficult to place Curtis Cobb No  Acute alcohol withdrawal.  Was improving but appears to backslide last 24 hours.  Anticipated discharge 24 to 48 hours     Level of care: Med-Surg  Consultants:  None  Procedures:  None  Antimicrobials:  None   Subjective: Curtis Cobb seen and examined.  Vital stable.  Curtis Cobb endorsing some  confusion and nausea associated with headache.  Objective: Vitals:   06/30/21 1628 06/30/21 2052 07/01/21 0543 07/01/21 0733  BP: 117/76 122/84 115/68 100/68  Pulse: 97 93 81 84  Resp:  16 16 18   Temp: 98.6 F (37 C) 98.3 F (36.8 C) (!) 97.3 F (36.3 C) 98.5 F (36.9 C)  TempSrc:    Oral  SpO2: 98% 98% 97% 97%  Weight:      Height:        Intake/Output Summary (Last 24 hours) at 07/01/2021 0945 Last data filed at 06/30/2021 1829 Gross per 24 hour  Intake 720 ml  Output --  Net 720 ml   Filed Weights   06/25/21 1710  Weight: 99.6 kg    Examination:  General exam: Mildly tremulous.  Appears fatigued Respiratory system: Lungs clear.  Normal work of breathing.  Room air Cardiovascular system: S1-S2, regular rate and rhythm, no murmurs gastrointestinal system: Soft, nondistended, mild tender to palpation epigastrium, normal bowel sounds Central nervous system: Alert and oriented x3.  No focal deficits.  Fine tremor noted upper extremities extremities: Symmetric 5 x 5 power. Skin: No rashes, lesions or ulcers Psychiatry: Judgement and insight appear normal. Mood & affect appropriate.     Data Reviewed: I have personally reviewed following labs and imaging studies  CBC: Recent Labs  Lab 06/25/21 1716 06/26/21 0657  WBC 10.8* 6.5  NEUTROABS 8.6*  --   HGB 15.4 12.8*  HCT 44.8 38.3*  MCV 85.2 84.9  PLT 301 237   Basic Metabolic Panel: Recent Labs  Lab 06/25/21 1716 06/26/21 0657  NA 132* 135  K 3.5 3.1*  CL 95* 98  CO2 22 27  GLUCOSE 123* 94  BUN 9 6  CREATININE 1.05 0.89  CALCIUM 8.9 8.0*  MG  --  1.8  PHOS  --  4.2   GFR: Estimated Creatinine Clearance: 129.5 mL/min (by C-G formula based on SCr of 0.89 mg/dL). Liver Function Tests: Recent Labs  Lab 06/25/21 1716 06/26/21 0657  AST 161* 124*  ALT 78* 67*  ALKPHOS 91 71  BILITOT 1.7* 1.4*  PROT 8.4* 6.8  ALBUMIN 4.0 3.1*   Recent Labs  Lab 06/25/21 1716  LIPASE 60*   No results for  input(s): AMMONIA in the last 168 hours. Coagulation Profile: No results for input(s): INR, PROTIME in the last 168 hours. Cardiac Enzymes: No results for input(s): CKTOTAL, CKMB, CKMBINDEX, TROPONINI in the last 168 hours. BNP (last 3 results) No results for input(s): PROBNP in the last 8760 hours. HbA1C: No results for input(s): HGBA1C in the last 72 hours. CBG: No results for input(s): GLUCAP in the last 168 hours. Lipid Profile: No results for input(s): CHOL, HDL, LDLCALC, TRIG, CHOLHDL, LDLDIRECT in the last 72 hours. Thyroid Function Tests: No results for input(s): TSH, T4TOTAL, FREET4, T3FREE, THYROIDAB in the last 72 hours. Anemia Panel: No results for input(s): VITAMINB12, FOLATE, FERRITIN, TIBC, IRON, RETICCTPCT in the last 72 hours. Sepsis Labs: No results for input(s): PROCALCITON, LATICACIDVEN in the last 168 hours.  No results found for this or any previous visit (from the past 240 hour(s)).       Radiology Studies: No results found.      Scheduled Meds:  busPIRone  10 mg Oral BID   chlordiazePOXIDE  10 mg Oral TID   divalproex  500 mg Oral Q12H   enoxaparin (LOVENOX) injection  0.5 mg/kg Subcutaneous Q24H   FLUoxetine  20 mg Oral Daily   folic acid  1 mg Oral Daily   levothyroxine  25 mcg Oral QAC breakfast   multivitamin with minerals  1 tablet Oral Daily   thiamine  100 mg Oral Daily   Or   thiamine  100 mg Intravenous Daily   Continuous Infusions:   LOS: 4 days    Time spent: 15 minutes    Tresa Moore, MD [facility] Pager [phone]  If 7PM-7AM, please contact night-coverage  07/01/2021, 9:45 AM

## 2021-07-01 NOTE — Progress Notes (Signed)
Midnight vital not obtained. Pt was sleeping soundly and has not been sleeping well.

## 2021-07-01 NOTE — Progress Notes (Signed)
Patient with no c/o nausea this morning.  He does c/o being jittery and feeling uneasy.  PRN ativan given to patient with good effect.  Will continue to monitor.

## 2021-07-02 LAB — BASIC METABOLIC PANEL
Anion gap: 9 (ref 5–15)
BUN: 8 mg/dL (ref 6–20)
CO2: 24 mmol/L (ref 22–32)
Calcium: 8.4 mg/dL — ABNORMAL LOW (ref 8.9–10.3)
Chloride: 105 mmol/L (ref 98–111)
Creatinine, Ser: 0.81 mg/dL (ref 0.61–1.24)
GFR, Estimated: >60 mL/min (ref >60)
Glucose, Bld: 82 mg/dL (ref 70–99)
Potassium: 3.9 mmol/L (ref 3.5–5.1)
Sodium: 138 mmol/L (ref 135–145)

## 2021-07-02 LAB — IRON AND TIBC
Iron: 29 ug/dL — ABNORMAL LOW (ref 45–182)
Saturation Ratios: 8 % — ABNORMAL LOW (ref 17.9–39.5)
TIBC: 364 ug/dL (ref 250–450)
UIBC: 335 ug/dL

## 2021-07-02 LAB — FOLATE: Folate: 16.4 ng/mL (ref >5.9)

## 2021-07-02 LAB — CBC
HCT: 40.8 % (ref 39.0–52.0)
Hemoglobin: 13.4 g/dL (ref 13.0–17.0)
MCH: 29.1 pg (ref 26.0–34.0)
MCHC: 32.8 g/dL (ref 30.0–36.0)
MCV: 88.5 fL (ref 80.0–100.0)
Platelets: 241 10*3/uL (ref 150–400)
RBC: 4.61 MIL/uL (ref 4.22–5.81)
RDW: 16.4 % — ABNORMAL HIGH (ref 11.5–15.5)
WBC: 5.2 10*3/uL (ref 4.0–10.5)
nRBC: 0 % (ref 0.0–0.2)

## 2021-07-02 LAB — CREATININE, SERUM
Creatinine, Ser: 0.87 mg/dL (ref 0.61–1.24)
GFR, Estimated: >60 mL/min (ref >60)

## 2021-07-02 LAB — HEPATIC FUNCTION PANEL
ALT: 90 U/L — ABNORMAL HIGH (ref 0–44)
AST: 89 U/L — ABNORMAL HIGH (ref 15–41)
Albumin: 3 g/dL — ABNORMAL LOW (ref 3.5–5.0)
Alkaline Phosphatase: 51 U/L (ref 38–126)
Bilirubin, Direct: <0.1 mg/dL (ref 0.0–0.2)
Total Bilirubin: 0.4 mg/dL (ref 0.3–1.2)
Total Protein: 6.7 g/dL (ref 6.5–8.1)

## 2021-07-02 LAB — PHOSPHORUS: Phosphorus: 4 mg/dL (ref 2.5–4.6)

## 2021-07-02 LAB — VITAMIN B12: Vitamin B-12: 442 pg/mL (ref 180–914)

## 2021-07-02 LAB — T4, FREE: Free T4: 0.62 ng/dL (ref 0.61–1.12)

## 2021-07-02 LAB — VITAMIN D 25 HYDROXY (VIT D DEFICIENCY, FRACTURES): Vit D, 25-Hydroxy: 29.19 ng/mL — ABNORMAL LOW (ref 30–100)

## 2021-07-02 LAB — MAGNESIUM: Magnesium: 2 mg/dL (ref 1.7–2.4)

## 2021-07-02 LAB — TSH: TSH: 12.989 u[IU]/mL — ABNORMAL HIGH (ref 0.350–4.500)

## 2021-07-02 MED ORDER — SODIUM CHLORIDE 0.9 % IV SOLN
200.0000 mg | INTRAVENOUS | Status: DC
  Administered 2021-07-02 – 2021-07-03 (×2): 200 mg via INTRAVENOUS
  Filled 2021-07-02 (×3): qty 10

## 2021-07-02 MED ORDER — ZOLPIDEM TARTRATE 5 MG PO TABS
5.0000 mg | ORAL_TABLET | Freq: Once | ORAL | Status: AC
  Administered 2021-07-02: 5 mg via ORAL
  Filled 2021-07-02: qty 1

## 2021-07-02 MED ORDER — CHLORDIAZEPOXIDE HCL 5 MG PO CAPS: 10.0000 mg | ORAL_CAPSULE | Freq: Every day | ORAL | Status: DC

## 2021-07-02 MED ORDER — LEVOTHYROXINE SODIUM 50 MCG PO TABS
50.0000 ug | ORAL_TABLET | Freq: Every day | ORAL | Status: DC
  Administered 2021-07-03 – 2021-07-04 (×2): 50 ug via ORAL
  Filled 2021-07-02 (×2): qty 1

## 2021-07-02 MED ORDER — MELATONIN 5 MG PO TABS
5.0000 mg | ORAL_TABLET | Freq: Every day | ORAL | Status: DC
  Administered 2021-07-03: 5 mg via ORAL
  Filled 2021-07-02 (×3): qty 1

## 2021-07-02 MED ORDER — CHLORDIAZEPOXIDE HCL 25 MG PO CAPS
25.0000 mg | ORAL_CAPSULE | Freq: Three times a day (TID) | ORAL | Status: AC
  Administered 2021-07-03 – 2021-07-04 (×3): 25 mg via ORAL
  Filled 2021-07-02 (×3): qty 1

## 2021-07-02 MED ORDER — TRAZODONE HCL 50 MG PO TABS
50.0000 mg | ORAL_TABLET | Freq: Two times a day (BID) | ORAL | Status: DC
  Administered 2021-07-02 (×2): 50 mg via ORAL
  Filled 2021-07-02 (×3): qty 1

## 2021-07-02 MED ORDER — CHLORDIAZEPOXIDE HCL 25 MG PO CAPS
25.0000 mg | ORAL_CAPSULE | Freq: Four times a day (QID) | ORAL | Status: AC
  Administered 2021-07-02 – 2021-07-03 (×4): 25 mg via ORAL
  Filled 2021-07-02: qty 1
  Filled 2021-07-02: qty 5
  Filled 2021-07-02 (×3): qty 1

## 2021-07-02 MED ORDER — CHLORDIAZEPOXIDE HCL 25 MG PO CAPS: 25.0000 mg | ORAL_CAPSULE | Freq: Two times a day (BID) | ORAL | Status: DC

## 2021-07-02 NOTE — Progress Notes (Signed)
[facility] Progress Note  Patient: Curtis Cobb    HQI:[id number]  DOA: 06/25/2021     Date of Service: the patient was seen and examined on 07/02/2021  Chief Complaint  Patient presents with   Abdominal Pain   Tachycardia   Brief hospital course: 50 y.o. male with medical history significant of alcohol abuse and dependence, anxiety disorder, schizoaffective disorder, hypertension, polysubstance abuse including narcotics and tobacco, cannabis abuse, who presented to the ER with intractable nausea vomiting abdominal pain.   Patient is a chronic alcoholic who endorses consumption of 1/5 of liquor a day.  He has been drinking for many years.  He states he has withdrawn before but has not been hospitalized for it.  He has interest in stopping drinking.  He was initiated on withdrawal protocol, IV fluids, antiemetics in the ER with good result.   Still demonstrating some mild symptoms of withdrawal including tremulousness and diffuse body aches.  Assessment and Plan: Principal Problem:   Alcohol withdrawal (HCC) Active Problems:   Schizoaffective disorder, depressive type (HCC)   Alcohol use disorder, severe, dependence (HCC)   Cannabis use disorder, moderate, dependence (HCC)   HTN (hypertension)   Rheumatoid arthritis (HCC)   Opioid use disorder, mild, in sustained remission (HCC)   MDD (major depressive disorder), recurrent episode, severe (HCC)   Tobacco use disorder   Hypothyroidism   Hyponatremia   Intractable nausea and vomiting Acute alcohol withdrawal Suspected alcohol gastritis Patient is a chronic alcoholic Drinks 1/5 of liquor a day Has history of previous withdrawals, not requiring hospitalization 2 days since last drink at time of presentation Is currently exhibiting some mild symptoms of withdrawal including tremulousness, lethargy, slightly altered mentation Seroquel stopped 7/1 Also complaining of body aches, improved 7/5: Complains of nausea,  headache, altered mentation.  Requiring fairly frequent Ativan Plan: Continue CIWA with as needed Ativan, minimize use of able Add as needed Robaxin for body aches As needed antiemetics PPI and sucralfate TOC consult for substance abuse resources 7/6 started Librium tapering dose Possible discharge home in 24 to 48 hours   Schizoaffective disorder Patient nonadherent with medications due to cost Home medications include Seroquel and fluoxetine Seroquel is being held Patient endorses Seroquel was causing mental status changes Will recommend holding Seroquel on discharge Fluoxetine resumed   Essential hypertension No medications at home Beta-blocker initiated   Generalized anxiety disorder Prescribed home BuSpar, not taking due to cost Will resume here for anxiety symptom control   Depression Has not been taking amitriptyline and Prozac due to cost Prozac reinitiated here Elavil held   History of rheumatoid arthritis Does not appear to be on any treatment No indication of exacerbation   Hypothyroid TSH elevated, patient is not taking Synthroid at home Increase Synthroid from 25 to 50 mcg p.o. daily, follow with PCP to repeat TSH level after 4 weeks   Hyponatremia Likely secondary to alcohol intake   Polysubstance abuse Counseled patient Offer nicotine patch TOC consult for substance abuse resources   Iron deficiency, iron saturation 8%.  Started Venofer 200 mg IV daily for 5 days Start oral supplement on discharge.   Body mass index is 28.18 kg/m.  Interventions:       Diet: Regular DVT Prophylaxis: Subcutaneous Lovenox   Advance goals of care discussion: Full code  Family Communication: family was Not present at bedside, at the time of interview.  The pt provided permission to discuss medical plan with the family. Opportunity was given to ask question and  all questions were answered satisfactorily.   Disposition:  Pt is from Home, admitted with  alcohol abuse for detox, still has withdrawal symptoms, which precludes a safe discharge. Discharge to home, when withdrawal symptoms will resolve, possible in few days. Started Librium tapering dose  Subjective: No overnight issues, patient is still complaining of feeling nauseous, skin crawling, pins-and-needles, shaking.  Denies any chest pain or palpitations, no shortness of breath.  Denies any abdominal pain, no vomiting or diarrhea.  Physical Exam: General:  alert oriented to time, place, and person.  Appear in mild distress, affect appropriate Eyes: PERRLA ENT: Oral Mucosa Clear, moist  Neck: no JVD,  Cardiovascular: S1 and S2 Present, no Murmur,  Respiratory: good respiratory effort, Bilateral Air entry equal and Decreased, n Crackles, on wheezes Abdomen: Bowel Sound present, Soft and no tenderness,  Skin: no rashes Extremities: no Pedal edema, no calf tenderness Neurologic: without any new focal findings, mild shaking right hand greater than the left hand. Gait not checked due to patient safety concerns  Vitals:   07/01/21 2030 07/02/21 0407 07/02/21 0834 07/02/21 1137  BP: 122/76 121/75 109/74 138/79  Pulse: 92 75 77 77  Resp: 18 18 18 16   Temp: 97.9 F (36.6 C) 98.1 F (36.7 C) 98.4 F (36.9 C) 98.2 F (36.8 C)  TempSrc:      SpO2: 96% 97% 97% 95%  Weight:      Height:        Intake/Output Summary (Last 24 hours) at 07/02/2021 1402 Last data filed at 07/02/2021 1350 Gross per 24 hour  Intake 840 ml  Output --  Net 840 ml   Filed Weights   06/25/21 1710  Weight: 99.6 kg    Data Reviewed: I have personally reviewed and interpreted daily labs, tele strips, imagings as discussed above. I reviewed all nursing notes, pharmacy notes, vitals, pertinent old records I have discussed plan of care as described above with RN and patient/family.  CBC: Recent Labs  Lab 06/25/21 1716 06/26/21 0657 07/01/21 1301 07/02/21 0759  WBC 10.8* 6.5 4.4 5.2  NEUTROABS 8.6*   --   --   --   HGB 15.4 12.8* 13.8 13.4  HCT 44.8 38.3* 43.5 40.8  MCV 85.2 84.9 91.0 88.5  PLT 301 237 219 241   Basic Metabolic Panel: Recent Labs  Lab 06/25/21 1716 06/26/21 0657 07/01/21 1301 07/02/21 0425 07/02/21 0759  NA 132* 135 138  --  138  K 3.5 3.1* 4.6  --  3.9  CL 95* 98 105  --  105  CO2 22 27 27   --  24  GLUCOSE 123* 94 106*  --  82  BUN 9 6 8   --  8  CREATININE 1.05 0.89 0.94 0.87 0.81  CALCIUM 8.9 8.0* 8.8*  --  8.4*  MG  --  1.8 1.8  --  2.0  PHOS  --  4.2 3.0  --  4.0    Studies: No results found.  Scheduled Meds:  busPIRone  10 mg Oral BID   chlordiazePOXIDE  25 mg Oral QID   Followed by   09/02/21 ON 07/03/2021] chlordiazePOXIDE  25 mg Oral TID   Followed by   ON 07/04/2021] chlordiazePOXIDE  25 mg Oral BID   Followed by   09/03/2021 ON 07/06/2021] chlordiazePOXIDE  10 mg Oral Daily   divalproex  500 mg Oral Q12H   enoxaparin (LOVENOX) injection  0.5 mg/kg Subcutaneous Q24H   FLUoxetine  20 mg Oral Daily  folic acid  1 mg Oral Daily   [START ON 07/03/2021] levothyroxine  50 mcg Oral QAC breakfast   multivitamin with minerals  1 tablet Oral Daily   thiamine  100 mg Oral Daily   Or   thiamine  100 mg Intravenous Daily   Continuous Infusions:  iron sucrose     PRN Meds: acetaminophen, LORazepam, LORazepam **OR** [DISCONTINUED] LORazepam, methocarbamol, metoprolol tartrate, ondansetron **OR** ondansetron (ZOFRAN) IV, traZODone  Time spent: 35 minutes  Author: Gillis Santa. MD [facility] 07/02/2021 2:02 PM  To reach On-call, see care teams to locate the attending and reach out to them via [email or web address]. If 7PM-7AM, please contact night-coverage If you still have difficulty reaching the attending provider, please page the [facility] (Director on Call) for [facility] on amion for assistance.

## 2021-07-02 NOTE — Progress Notes (Signed)
MD notified trough secure message about patient's CIWA score, elevated from an 8 @ 0800 to a 12 @ 1245. MD gave permission to give Ativan 1 hour early at 3 hours rather than 4 hours.

## 2021-07-03 DIAGNOSIS — F332 Major depressive disorder, recurrent severe without psychotic features: Secondary | ICD-10-CM

## 2021-07-03 LAB — HEPATIC FUNCTION PANEL
ALT: 76 U/L — ABNORMAL HIGH (ref 0–44)
AST: 63 U/L — ABNORMAL HIGH (ref 15–41)
Albumin: 3.2 g/dL — ABNORMAL LOW (ref 3.5–5.0)
Alkaline Phosphatase: 47 U/L (ref 38–126)
Bilirubin, Direct: <0.1 mg/dL (ref 0.0–0.2)
Total Bilirubin: 0.4 mg/dL (ref 0.3–1.2)
Total Protein: 6.7 g/dL (ref 6.5–8.1)

## 2021-07-03 LAB — BASIC METABOLIC PANEL
Anion gap: 8 (ref 5–15)
BUN: 6 mg/dL (ref 6–20)
CO2: 26 mmol/L (ref 22–32)
Calcium: 8.6 mg/dL — ABNORMAL LOW (ref 8.9–10.3)
Chloride: 106 mmol/L (ref 98–111)
Creatinine, Ser: 0.7 mg/dL (ref 0.61–1.24)
GFR, Estimated: >60 mL/min (ref >60)
Glucose, Bld: 87 mg/dL (ref 70–99)
Potassium: 3.6 mmol/L (ref 3.5–5.1)
Sodium: 140 mmol/L (ref 135–145)

## 2021-07-03 LAB — CBC
HCT: 41.6 % (ref 39.0–52.0)
Hemoglobin: 13.2 g/dL (ref 13.0–17.0)
MCH: 28.8 pg (ref 26.0–34.0)
MCHC: 31.7 g/dL (ref 30.0–36.0)
MCV: 90.8 fL (ref 80.0–100.0)
Platelets: 263 10*3/uL (ref 150–400)
RBC: 4.58 MIL/uL (ref 4.22–5.81)
RDW: 16.4 % — ABNORMAL HIGH (ref 11.5–15.5)
WBC: 6.1 10*3/uL (ref 4.0–10.5)
nRBC: 0 % (ref 0.0–0.2)

## 2021-07-03 LAB — PHOSPHORUS: Phosphorus: 4.9 mg/dL — ABNORMAL HIGH (ref 2.5–4.6)

## 2021-07-03 LAB — MAGNESIUM: Magnesium: 2.1 mg/dL (ref 1.7–2.4)

## 2021-07-03 MED ORDER — TRAZODONE HCL 50 MG PO TABS: 50.0000 mg | ORAL_TABLET | Freq: Every day | ORAL | Status: DC

## 2021-07-03 MED ORDER — QUETIAPINE FUMARATE 25 MG PO TABS
50.0000 mg | ORAL_TABLET | Freq: Every day | ORAL | Status: DC
  Administered 2021-07-03: 50 mg via ORAL
  Filled 2021-07-03: qty 2

## 2021-07-03 MED ORDER — PREDNISONE 50 MG PO TABS
50.0000 mg | ORAL_TABLET | Freq: Every day | ORAL | Status: DC
  Administered 2021-07-04: 50 mg via ORAL
  Filled 2021-07-03: qty 1

## 2021-07-03 MED ORDER — DIVALPROEX SODIUM 250 MG PO DR TAB
250.0000 mg | DELAYED_RELEASE_TABLET | Freq: Two times a day (BID) | ORAL | Status: DC
  Administered 2021-07-03 – 2021-07-04 (×2): 250 mg via ORAL
  Filled 2021-07-03 (×3): qty 1

## 2021-07-03 MED ORDER — PREDNISONE 20 MG PO TABS: 20.0000 mg | ORAL_TABLET | Freq: Every day | ORAL | Status: DC

## 2021-07-03 MED ORDER — VITAMIN D (ERGOCALCIFEROL) 1.25 MG (50000 UNIT) PO CAPS
50000.0000 [IU] | ORAL_CAPSULE | ORAL | Status: DC
  Administered 2021-07-03: 50000 [IU] via ORAL
  Filled 2021-07-03: qty 1

## 2021-07-03 MED ORDER — PREDNISONE 20 MG PO TABS: 40.0000 mg | ORAL_TABLET | Freq: Every day | ORAL | Status: DC

## 2021-07-03 MED ORDER — DEXAMETHASONE SODIUM PHOSPHATE 4 MG/ML IJ SOLN
4.0000 mg | Freq: Once | INTRAMUSCULAR | Status: AC
  Administered 2021-07-03: 4 mg via INTRAVENOUS
  Filled 2021-07-03: qty 1

## 2021-07-03 MED ORDER — ALUM & MAG HYDROXIDE-SIMETH 200-200-20 MG/5ML PO SUSP
30.0000 mL | ORAL | Status: DC | PRN
  Filled 2021-07-03: qty 30

## 2021-07-03 MED ORDER — PREDNISONE 10 MG PO TABS: 10.0000 mg | ORAL_TABLET | Freq: Every day | ORAL | Status: DC

## 2021-07-03 MED ORDER — PREDNISONE 20 MG PO TABS: 30.0000 mg | ORAL_TABLET | Freq: Every day | ORAL | Status: DC

## 2021-07-03 MED ORDER — BUSPIRONE HCL 15 MG PO TABS
15.0000 mg | ORAL_TABLET | Freq: Two times a day (BID) | ORAL | Status: DC
  Administered 2021-07-03 – 2021-07-04 (×2): 15 mg via ORAL
  Filled 2021-07-03 (×3): qty 1

## 2021-07-03 NOTE — Progress Notes (Signed)
Had been requested to speak with patient about his concerns. Went in, introduced my self to patient, asked him what his concerns were. He stated he wants B12 and has not slept in 5 nights.I told him we would contact the MD. Wynn Banker said she was secure chatting the MD. Staff had told me he has broken 3 phones, Requested he not break anymore of the phones.He stated this was the only one he had broken. The others had fallen on the floor.Told RN I had spoken with patient.

## 2021-07-03 NOTE — Discharge Instructions (Signed)
[facility] ?Mental health service in [place], [place] ?Address: [address], [place], [place] [address] ?Hours:  ?Open ? Closes 5?PM ?Phone: [phone] ?

## 2021-07-03 NOTE — Progress Notes (Signed)
Poison Control called due to patient drinking hand sanitizer, information given to [place] from [employer]. Will monitor signs and symptom of heartburn and indigestion in which drinking hand sanitizer may cause per Poison Control. Patient complained of heartburn, MD notified, 30mg  Maalox PO given with improvement, will continue to monitor.

## 2021-07-03 NOTE — Consult Note (Signed)
[facility] Face-to-Face Psychiatry Consult   Reason for Consult:  depression and alcohol abuse Referring Physician:  Dr Lucianne Muss Patient Identification: Curtis Cobb MRN:  [id number] Principal Diagnosis: Alcohol withdrawal (HCC) Diagnosis:  Principal Problem:   Alcohol withdrawal (HCC) Active Problems:   Alcohol use disorder, severe, dependence (HCC)   MDD (major depressive disorder), recurrent episode, severe (HCC)   Cannabis use disorder, moderate, dependence (HCC)   HTN (hypertension)   Rheumatoid arthritis (HCC)   Opioid use disorder, mild, in sustained remission (HCC)   Tobacco use disorder   Hypothyroidism   Total Time spent with patient: 1 hour  Subjective:   Curtis Cobb is a 50 y.o. male patient admitted with alcohol detox.  HPI:  50 yo male with depression and anxiety, alcohol use disorder and polysubstance use d/o.  His depression is an 8/10 with no current suicidal ideations, passive intermittent suicidal ideations at times.  One past suicide attempt prior to going to jail, not admitted to a hospital, over 15 years ago.  Anxiety is moderate with occasional panic attack.  His depression and anxiety started as a teenager with self-medicating with drugs and alcohol.  He was drinking a 1/2 gallon of liquor daily with multiple drugs (cocaine, opiates), "everything but PCP".  No withdrawal symptoms.  No mania, hallucinations, paranoia, or homicidal ideations.  Sleep is fair, appetite is "good".  He has multiple medication requests, see treatment plan (adjustments made), along with a "healthy place to stay."  He is familiar with [facility] and can go there for therapy, medication management, and substance abuse groups.  Calmly lying on the bed with his hands behind his head throughout the assessment with the television turned down, no distress noted.  Past Psychiatric History: polysubstance d/o, alcohol dependence, depression, anxiety  Risk to Self:  none Risk to Others:  none Prior  Inpatient Therapy:  detox Prior Outpatient Therapy:  [facility] in the past  Past Medical History:  Past Medical History:  Diagnosis Date   Alcohol use disorder, moderate, dependence (HCC) 02/13/2014   Anxiety    COVID-19 03/29/2021   Diagnosed in [facility] ED on 03/29/2021   Depression    Hypertension    Rheumatoid arthritis (HCC)    Substance abuse (HCC)     Past Surgical History:  Procedure Laterality Date   INCISION AND DRAINAGE ABSCESS Right 08/25/2018   Procedure: INCISION AND DRAINAGE ABSCESS;  Surgeon: Christena Flake, MD;  Location: [facility] ORS;  Service: Orthopedics;  Laterality: Right;   INCISION AND DRAINAGE ABSCESS Right 08/30/2018   Procedure: INCISION AND DRAINAGE ABSCESS;  Surgeon: Christena Flake, MD;  Location: [facility] ORS;  Service: Orthopedics;  Laterality: Right;   none     Family History:  Family History  Problem Relation Age of Onset   Heart attack Mother    Stroke Mother    Hypertension Mother    Mental illness Mother        Manic Depressive Bipolar Disorder   Heart disease Mother    Hypertension Maternal Aunt    Mental illness Maternal Aunt    Hypertension Maternal Aunt    Mental illness Maternal Aunt    Family Psychiatric  History: see above Social History:  Social History   Substance and Sexual Activity  Alcohol Use Yes   Comment: beer 6 hours ago 03/28/2021     Social History   Substance and Sexual Activity  Drug Use No   Types: Amphetamines, Marijuana, Cocaine, Heroin   Comment: Patient denies recent drug  use.    Social History   Socioeconomic History   Marital status: Single    Spouse name: Not on file   Number of children: Not on file   Years of education: Not on file   Highest education level: Not on file  Occupational History   Not on file  Tobacco Use   Smoking status: Never   Smokeless tobacco: Never  Substance and Sexual Activity   Alcohol use: Yes    Comment: beer 6 hours ago 03/28/2021   Drug use: No    Types: Amphetamines, Marijuana,  Cocaine, Heroin    Comment: Patient denies recent drug use.   Sexual activity: Yes    Birth control/protection: None  Other Topics Concern   Not on file  Social History Narrative   Not on file   Social Determinants of Health   Financial Resource Strain: Not on file  Food Insecurity: Not on file  Transportation Needs: Not on file  Physical Activity: Not on file  Stress: Not on file  Social Connections: Not on file   Additional Social History:    Allergies:   Allergies  Allergen Reactions   Sulfa Antibiotics Other (See Comments)    Unknown reaction    Labs:  Results for orders placed or performed during the hospital encounter of 06/25/21 (from the past 48 hour(s))  Comprehensive metabolic panel     Status: Abnormal   Collection Time: 07/01/21  1:01 PM  Result Value Ref Range   Sodium 138 135 - 145 mmol/L   Potassium 4.6 3.5 - 5.1 mmol/L   Chloride 105 98 - 111 mmol/L   CO2 27 22 - 32 mmol/L   Glucose, Bld 106 (H) 70 - 99 mg/dL    Comment: Glucose reference range applies only to samples taken after fasting for at least 8 hours.   BUN 8 6 - 20 mg/dL   Creatinine, Ser 2.44 0.61 - 1.24 mg/dL   Calcium 8.8 (L) 8.9 - 10.3 mg/dL   Total Protein 7.2 6.5 - 8.1 g/dL   Albumin 3.3 (L) 3.5 - 5.0 g/dL   AST 010 (H) 15 - 41 U/L   ALT 118 (H) 0 - 44 U/L   Alkaline Phosphatase 60 38 - 126 U/L   Total Bilirubin 0.5 0.3 - 1.2 mg/dL   GFR, Estimated >27 >25 mL/min    Comment: (NOTE) Calculated using the CKD-EPI Creatinine Equation (2021)    Anion gap 6 5 - 15    Comment: Performed at [facility], [address], [place], [place] [address]  Magnesium     Status: None   Collection Time: 07/01/21  1:01 PM  Result Value Ref Range   Magnesium 1.8 1.7 - 2.4 mg/dL    Comment: Performed at [facility], [address] [place], [place] [address]  Phosphorus     Status: None   Collection Time: 07/01/21  1:01 PM  Result Value Ref Range   Phosphorus 3.0 2.5 - 4.6  mg/dL    Comment: Performed at [facility], [address] [place], [place] [address]  CBC     Status: Abnormal   Collection Time: 07/01/21  1:01 PM  Result Value Ref Range   WBC 4.4 4.0 - 10.5 K/uL   RBC 4.78 4.22 - 5.81 MIL/uL   Hemoglobin 13.8 13.0 - 17.0 g/dL   HCT 38.7 56.4 - 33.2 %   MCV 91.0 80.0 - 100.0 fL   MCH 28.9 26.0 - 34.0 pg  MCHC 31.7 30.0 - 36.0 g/dL   RDW 40.9 (H) 81.1 - 91.4 %   Platelets 219 150 - 400 K/uL   nRBC 0.0 0.0 - 0.2 %    Comment: Performed at [facility], [address] [place], [place] [address]  Creatinine, serum     Status: None   Collection Time: 07/02/21  4:25 AM  Result Value Ref Range   Creatinine, Ser 0.87 0.61 - 1.24 mg/dL   GFR, Estimated >62 >13 mL/min    Comment: (NOTE) Calculated using the CKD-EPI Creatinine Equation (2021) Performed at [facility], [address] [place], [place] [address]   TSH     Status: Abnormal   Collection Time: 07/02/21  7:59 AM  Result Value Ref Range   TSH 12.989 (H) 0.350 - 4.500 uIU/mL    Comment: Performed by a 3rd Generation assay with a functional sensitivity of <=0.01 uIU/mL. Performed at [facility], [address] [place], [place] [address]   Iron and TIBC     Status: Abnormal   Collection Time: 07/02/21  7:59 AM  Result Value Ref Range   Iron 29 (L) 45 - 182 ug/dL   TIBC 295 284 - 132 ug/dL   Saturation Ratios 8 (L) 17.9 - 39.5 %   UIBC 335 ug/dL    Comment: Performed at [facility], [address] [place], [place] [address]  Vitamin B12     Status: None   Collection Time: 07/02/21  7:59 AM  Result Value Ref Range   Vitamin B-12 442 180 - 914 pg/mL    Comment: (NOTE) This assay is not validated for testing neonatal or myeloproliferative syndrome specimens for Vitamin B12 levels. Performed at [facility], [address] [place], [place] [address]   Folate     Status: None   Collection Time: 07/02/21  7:59 AM  Result  Value Ref Range   Folate 16.4 >5.9 ng/mL    Comment: Performed at [facility], [address] [place], [place] [address]  VITAMIN D 25 Hydroxy (Vit-D Deficiency, Fractures)     Status: Abnormal   Collection Time: 07/02/21  7:59 AM  Result Value Ref Range   Vit D, 25-Hydroxy 29.19 (L) 30 - 100 ng/mL    Comment: (NOTE) Vitamin D deficiency has been defined by the Institute of Medicine  and an Endocrine Society practice guideline as a level of serum 25-OH  vitamin D less than 20 ng/mL (1,2). The Endocrine Society went on to  further define vitamin D insufficiency as a level between 21 and 29  ng/mL (2).  1. IOM (Institute of Medicine). 2010. Dietary reference intakes for  calcium and D. [place]: [employer]. 2. Holick MF, Binkley [place], Bischoff-Ferrari HA, et al. Evaluation,  treatment, and prevention of vitamin D deficiency: an Endocrine  Society clinical practice guideline, JCEM. 2011 Jul; 96(7): 1911-30.  Performed at [facility], [address] [place], [place] [address]   Basic metabolic panel     Status: Abnormal   Collection Time: 07/02/21  7:59 AM  Result Value Ref Range   Sodium 138 135 - 145 mmol/L   Potassium 3.9 3.5 - 5.1 mmol/L   Chloride 105 98 - 111 mmol/L   CO2 24 22 - 32 mmol/L   Glucose, Bld 82 70 - 99 mg/dL    Comment: Glucose reference range applies only to samples taken after fasting for at least 8 hours.   BUN  8 6 - 20 mg/dL   Creatinine, Ser 5.78 0.61 - 1.24 mg/dL   Calcium 8.4 (L) 8.9 - 10.3 mg/dL   GFR, Estimated >46 >96 mL/min    Comment: (NOTE) Calculated using the CKD-EPI Creatinine Equation (2021)    Anion gap 9 5 - 15    Comment: Performed at [facility], [address] [place], [place] [address]  Magnesium     Status: None   Collection Time: 07/02/21  7:59 AM  Result Value Ref Range   Magnesium 2.0 1.7 - 2.4 mg/dL    Comment: Performed at [facility], [address]  [place], [place] [address]  Phosphorus     Status: None   Collection Time: 07/02/21  7:59 AM  Result Value Ref Range   Phosphorus 4.0 2.5 - 4.6 mg/dL    Comment: Performed at [facility], [address] [place], [place] [address]  CBC     Status: Abnormal   Collection Time: 07/02/21  7:59 AM  Result Value Ref Range   WBC 5.2 4.0 - 10.5 K/uL   RBC 4.61 4.22 - 5.81 MIL/uL   Hemoglobin 13.4 13.0 - 17.0 g/dL   HCT 72.5 36.6 - 44.0 %   MCV 88.5 80.0 - 100.0 fL   MCH 29.1 26.0 - 34.0 pg   MCHC 32.8 30.0 - 36.0 g/dL   RDW 34.7 (H) 42.5 - 95.6 %   Platelets 241 150 - 400 K/uL   nRBC 0.0 0.0 - 0.2 %    Comment: Performed at [facility], [address] [place], [place] [address]  Hepatic function panel     Status: Abnormal   Collection Time: 07/02/21  7:59 AM  Result Value Ref Range   Total Protein 6.7 6.5 - 8.1 g/dL   Albumin 3.0 (L) 3.5 - 5.0 g/dL   AST 89 (H) 15 - 41 U/L   ALT 90 (H) 0 - 44 U/L   Alkaline Phosphatase 51 38 - 126 U/L   Total Bilirubin 0.4 0.3 - 1.2 mg/dL   Bilirubin, Direct <4.3 0.0 - 0.2 mg/dL   Indirect Bilirubin NOT CALCULATED 0.3 - 0.9 mg/dL    Comment: Performed at [facility], [address] [place], [place] [address]  T4, free     Status: None   Collection Time: 07/02/21  7:59 AM  Result Value Ref Range   Free T4 0.62 0.61 - 1.12 ng/dL    Comment: (NOTE) Biotin ingestion may interfere with free T4 tests. If the results are inconsistent with the TSH level, previous test results, or the clinical presentation, then consider biotin interference. If needed, order repeat testing after stopping biotin. Performed at [facility], [address] [place], [place] [address]   CBC     Status: Abnormal   Collection Time: 07/03/21  4:12 AM  Result Value Ref Range   WBC 6.1 4.0 - 10.5 K/uL   RBC 4.58 4.22 - 5.81 MIL/uL   Hemoglobin 13.2 13.0 - 17.0 g/dL   HCT 60.6 30.1 - 60.1 %   MCV 90.8 80.0 - 100.0 fL   MCH 28.8 26.0 -  34.0 pg   MCHC 31.7 30.0 - 36.0 g/dL   RDW 09.3 (H) 23.5 - 57.3 %   Platelets 263 150 - 400 K/uL   nRBC 0.0 0.0 - 0.2 %    Comment: Performed at [facility], [address], [place], [place] [address]  Basic metabolic panel  Status: Abnormal   Collection Time: 07/03/21  4:12 AM  Result Value Ref Range   Sodium 140 135 - 145 mmol/L   Potassium 3.6 3.5 - 5.1 mmol/L   Chloride 106 98 - 111 mmol/L   CO2 26 22 - 32 mmol/L   Glucose, Bld 87 70 - 99 mg/dL    Comment: Glucose reference range applies only to samples taken after fasting for at least 8 hours.   BUN 6 6 - 20 mg/dL   Creatinine, Ser 2.75 0.61 - 1.24 mg/dL   Calcium 8.6 (L) 8.9 - 10.3 mg/dL   GFR, Estimated >17 >00 mL/min    Comment: (NOTE) Calculated using the CKD-EPI Creatinine Equation (2021)    Anion gap 8 5 - 15    Comment: Performed at [facility], [address] [place], [place] [address]  Hepatic function panel     Status: Abnormal   Collection Time: 07/03/21  4:12 AM  Result Value Ref Range   Total Protein 6.7 6.5 - 8.1 g/dL   Albumin 3.2 (L) 3.5 - 5.0 g/dL   AST 63 (H) 15 - 41 U/L   ALT 76 (H) 0 - 44 U/L   Alkaline Phosphatase 47 38 - 126 U/L   Total Bilirubin 0.4 0.3 - 1.2 mg/dL   Bilirubin, Direct <4.9 0.0 - 0.2 mg/dL   Indirect Bilirubin NOT CALCULATED 0.3 - 0.9 mg/dL    Comment: Performed at [facility], [address], [place], [place] [address]  Magnesium     Status: None   Collection Time: 07/03/21  4:12 AM  Result Value Ref Range   Magnesium 2.1 1.7 - 2.4 mg/dL    Comment: Performed at [facility], [address] [place], [place] [address]  Phosphorus     Status: Abnormal   Collection Time: 07/03/21  4:12 AM  Result Value Ref Range   Phosphorus 4.9 (H) 2.5 - 4.6 mg/dL    Comment: Performed at [facility], [address] [place] [place], [place] [address]    Current Facility-Administered Medications  Medication Dose Route Frequency Provider Last  Rate Last Admin   acetaminophen (TYLENOL) tablet 650 mg  650 mg Oral Q4H PRN Mansy, Jan A, MD   650 mg at 07/02/21 1248   busPIRone (BUSPAR) tablet 10 mg  10 mg Oral BID Lolita Patella B, MD   10 mg at 07/03/21 1012   chlordiazePOXIDE (LIBRIUM) capsule 25 mg  25 mg Oral TID Gillis Santa, MD       Followed by   Melene Muller ON 07/04/2021] chlordiazePOXIDE (LIBRIUM) capsule 25 mg  25 mg Oral BID Gillis Santa, MD       Followed by   Melene Muller ON 07/06/2021] chlordiazePOXIDE (LIBRIUM) capsule 10 mg  10 mg Oral Daily Gillis Santa, MD       divalproex (DEPAKOTE) DR tablet 500 mg  500 mg Oral Q12H Garba, Mohammad L, MD   500 mg at 07/03/21 1012   enoxaparin (LOVENOX) injection 50 mg  0.5 mg/kg Subcutaneous Q24H Mikeal Hawthorne, Mohammad L, MD   50 mg at 07/03/21 1013   FLUoxetine (PROZAC) capsule 20 mg  20 mg Oral Daily Earlie Lou L, MD   20 mg at 07/03/21 1012   folic acid (FOLVITE) tablet 1 mg  1 mg Oral Daily Earlie Lou L, MD   1 mg at 07/03/21 1011   iron sucrose (VENOFER) 200 mg in sodium chloride 0.9 % 100 mL IVPB  200 mg Intravenous Q24H Gillis Santa, MD  Stopped at 07/03/21 0311   levothyroxine (SYNTHROID) tablet 50 mcg  50 mcg Oral QAC breakfast Gillis Santa, MD   50 mcg at 07/03/21 0555   LORazepam (ATIVAN) injection 0.5 mg  0.5 mg Intravenous Q4H PRN Mansy, Jan A, MD   0.5 mg at 07/01/21 1934   LORazepam (ATIVAN) tablet 1-4 mg  1-4 mg Oral Q4H PRN Lolita Patella B, MD   4 mg at 07/03/21 0427   melatonin tablet 5 mg  5 mg Oral QHS Gillis Santa, MD       methocarbamol (ROBAXIN) tablet 500 mg  500 mg Oral Q6H PRN Lolita Patella B, MD   500 mg at 07/01/21 2153   metoprolol tartrate (LOPRESSOR) injection 5 mg  5 mg Intravenous Q6H PRN Rometta Emery, MD       multivitamin with minerals tablet 1 tablet  1 tablet Oral Daily Rometta Emery, MD   1 tablet at 07/03/21 1011   ondansetron (ZOFRAN) tablet 4 mg  4 mg Oral Q6H PRN Rometta Emery, MD   4 mg at 06/30/21 1633   Or   ondansetron  (ZOFRAN) injection 4 mg  4 mg Intravenous Q6H PRN Rometta Emery, MD   4 mg at 06/30/21 0752   [START ON 07/04/2021] predniSONE (DELTASONE) tablet 50 mg  50 mg Oral Q breakfast Gillis Santa, MD       Followed by   Melene Muller ON 07/09/2021] predniSONE (DELTASONE) tablet 40 mg  40 mg Oral Q breakfast Gillis Santa, MD       Followed by   Melene Muller ON 07/11/2021] predniSONE (DELTASONE) tablet 30 mg  30 mg Oral Q breakfast Gillis Santa, MD       Followed by   Melene Muller ON 07/13/2021] predniSONE (DELTASONE) tablet 20 mg  20 mg Oral Q breakfast Gillis Santa, MD       Followed by   Melene Muller ON 07/15/2021] predniSONE (DELTASONE) tablet 10 mg  10 mg Oral Q breakfast Gillis Santa, MD       thiamine tablet 100 mg  100 mg Oral Daily Earlie Lou L, MD   100 mg at 07/03/21 1012   Or   thiamine (B-1) injection 100 mg  100 mg Intravenous Daily Rometta Emery, MD       traZODone (DESYREL) tablet 50 mg  50 mg Oral QHS Gillis Santa, MD       Vitamin D (Ergocalciferol) (DRISDOL) capsule 50,000 Units  50,000 Units Oral Q7 days Gillis Santa, MD   50,000 Units at 07/03/21 1012    Musculoskeletal: Strength & Muscle Tone: within normal limits Gait & Station: normal Patient leans: N/A  Psychiatric Specialty Exam: Physical Exam Vitals and nursing note reviewed.  Constitutional:      Appearance: He is well-developed.  HENT:     Head: Normocephalic.  Pulmonary:     Effort: Pulmonary effort is normal.  Neurological:     General: No focal deficit present.     Mental Status: He is alert and oriented to person, place, and time.  Psychiatric:        Attention and Perception: Attention and perception normal.        Mood and Affect: Mood is anxious and depressed.        Speech: Speech normal.        Behavior: Behavior normal. Behavior is cooperative.        Thought Content: Thought content normal.        Cognition and Memory: Cognition and memory  normal.        Judgment: Judgment normal.    Review of Systems   Psychiatric/Behavioral:  Positive for depression and substance abuse. The patient is nervous/anxious.   All other systems reviewed and are negative.  Blood pressure 111/81, pulse 79, temperature 97.8 F (36.6 C), temperature source Oral, resp. rate 16, height 6\' 2"  (1.88 m), weight 99.6 kg, SpO2 97 %.Body mass index is 28.18 kg/m.  General Appearance: Casual  Eye Contact:  Good  Speech:  Clear and Coherent  Volume:  Normal  Mood:  Anxious and Depressed  Affect:  Congruent  Thought Process:  Coherent and Descriptions of Associations: Intact  Orientation:  Full (Time, Place, and Person)  Thought Content:  WDL and Logical  Suicidal Thoughts:  No  Homicidal Thoughts:  No  Memory:  Immediate;   Good Recent;   Good Remote;   Good  Judgement:  Fair  Insight:  Fair  Psychomotor Activity:  Normal  Concentration:  Concentration: Good and Attention Span: Good  Recall:  Good  Fund of Knowledge:  Good  Language:  Good  Akathisia:  No  Handed:  Right  AIMS (if indicated):     Assets:  Leisure Time Physical Health Resilience  ADL's:  Intact  Cognition:  WNL  Sleep:        Physical Exam: Physical Exam Vitals and nursing note reviewed.  Constitutional:      Appearance: He is well-developed.  HENT:     Head: Normocephalic.  Pulmonary:     Effort: Pulmonary effort is normal.  Neurological:     General: No focal deficit present.     Mental Status: He is alert and oriented to person, place, and time.  Psychiatric:        Attention and Perception: Attention and perception normal.        Mood and Affect: Mood is anxious and depressed.        Speech: Speech normal.        Behavior: Behavior normal. Behavior is cooperative.        Thought Content: Thought content normal.        Cognition and Memory: Cognition and memory normal.        Judgment: Judgment normal.   Review of Systems  Psychiatric/Behavioral:  Positive for depression and substance abuse. The patient is  nervous/anxious.   All other systems reviewed and are negative. Blood pressure 111/81, pulse 79, temperature 97.8 F (36.6 C), temperature source Oral, resp. rate 16, height 6\' 2"  (1.88 m), weight 99.6 kg, SpO2 97 %. Body mass index is 28.18 kg/m.  Treatment Plan Summary: Major depressive disorder, moderate: -Continue Prozac 40 mg daily  Alcohol use d/o: -Follow up with [facility]  General anxiety disorder: -Increase Buspar 10 mg BID to 15 mg BID  Insomnia: -Discontinue Trazodone 50 mg at bedtime PRN -Started Serquel 50 mg at bedtime, repeat once in an hour if not asleep  Disposition: No evidence of imminent risk to self or others at present.   Supportive therapy provided about ongoing stressors.  , NP 07/03/2021 11:36 AM

## 2021-07-03 NOTE — Progress Notes (Addendum)
Pt's behavior has become worse this evening after the doctor spoke to him and stated that he will be discharge tomorrow. Episodes such as breaking the phone,dispensing hand sanitizer in a foam cup, yelling cursing words that can be heard at the nursing station, kicking the door, and dragging the chair in the room have been witnessed. Security was called twice to come and talk to pt. Pt has multiple complaints about medications and multiple requests for medication changes; requesting increases in Ativan and requesting Valium; states that he needs 4 milligrams of each. MD made aware of the situation.

## 2021-07-03 NOTE — Progress Notes (Signed)
Patient was witnessed drinking hand-sanitizer. Dispenser removed from room. Patient then witnessed outside room getting hand-sanitizer from hallway dispenser. Patient redirected to room. Security present on department, multiple behavioral concerns have occurred throughout shift. Oncoming CN and AC aware. Perlie Mayo, RN

## 2021-07-03 NOTE — Progress Notes (Addendum)
[facility] Progress Note  Patient: Curtis Cobb    QMG:[id number]  DOA: 06/25/2021     Date of Service: the patient was seen and examined on 07/03/2021  Chief Complaint  Patient presents with   Abdominal Pain   Tachycardia   Brief hospital course: 50 y.o. male with medical history significant of alcohol abuse and dependence, anxiety disorder, schizoaffective disorder, hypertension, polysubstance abuse including narcotics and tobacco, cannabis abuse, who presented to the ER with intractable nausea vomiting abdominal pain.   Patient is a chronic alcoholic who endorses consumption of 1/5 of liquor a day.  He has been drinking for many years.  He states he has withdrawn before but has not been hospitalized for it.  He has interest in stopping drinking.  He was initiated on withdrawal protocol, IV fluids, antiemetics in the ER with good result.   Still demonstrating some mild symptoms of withdrawal including tremulousness and diffuse body aches.  Assessment and Plan: Principal Problem:   Alcohol withdrawal (HCC) Active Problems:   Schizoaffective disorder, depressive type (HCC)   Alcohol use disorder, severe, dependence (HCC)   Cannabis use disorder, moderate, dependence (HCC)   HTN (hypertension)   Rheumatoid arthritis (HCC)   Opioid use disorder, mild, in sustained remission (HCC)   MDD (major depressive disorder), recurrent episode, severe (HCC)   Tobacco use disorder   Hypothyroidism   Hyponatremia   Intractable nausea and vomiting Acute alcohol withdrawal Suspected alcohol gastritis Patient is a chronic alcoholic Drinks 1/5 of liquor a day Has history of previous withdrawals, not requiring hospitalization 2 days since last drink at time of presentation Is currently exhibiting some mild symptoms of withdrawal including tremulousness, lethargy, slightly altered mentation Seroquel stopped 7/1 Also complaining of body aches, improved 7/5: Complains of nausea,  headache, altered mentation.  Requiring fairly frequent Ativan Plan: Continue CIWA with as needed Ativan, minimize use of able Add as needed Robaxin for body aches As needed antiemetics PPI and sucralfate TOC consult for substance abuse resources 7/6 started Librium tapering dose Possible discharge home in 24 to 48 hours   Schizoaffective disorder Patient nonadherent with medications due to cost Home medications include Seroquel and fluoxetine Seroquel was held, Patient endorses Seroquel was causing mental status changes Fluoxetine resumed 7/7 psych resumed Seroquel 50 mg nightly for insomnia   Essential hypertension No medications at home Beta-blocker initiated   Generalized anxiety disorder Prescribed home BuSpar, not taking due to cost Will resume here for anxiety symptom control 7/7 psych increased BuSpar from 10 mg twice daily to 15 mg p.o. twice daily  Insomnia Continue melatonin Discontinued trazodone, started Seroquel 50 mg at bedtime, repeat once in an hour if not asleep   Depression Has not been taking amitriptyline and Prozac due to cost Prozac reinitiated here Elavil held   History of rheumatoid arthritis Does not appear to be on any treatment 7/7 complaining of generalized body ache and joint pain in upper extremities and bilateral knee joints, patient thinks that his RA is flaring up and requesting Decadron and prednisone Decadron 4 mg IV x1 dose given, started prednisone tapering dose   Hypothyroid TSH elevated, patient is not taking Synthroid at home Increase Synthroid from 25 to 50 mcg p.o. daily, follow with PCP to repeat TSH level after 4 weeks   Hyponatremia Likely secondary to alcohol intake   Polysubstance abuse Counseled patient Offer nicotine patch TOC consult for substance abuse resources   Iron deficiency, iron saturation 8%.  Started Venofer 200 mg IV  daily for 5 days Start oral supplement on discharge.  Vitamin D insufficiency,  started vitamin D supplement.  Follow with PCP to repeat vitamin D level after 3 to 6 months.   Body mass index is 28.18 kg/m.  Interventions:       Diet: Regular DVT Prophylaxis: Subcutaneous Lovenox   Advance goals of care discussion: Full code  Family Communication: family was Not present at bedside, at the time of interview.  The pt provided permission to discuss medical plan with the family. Opportunity was given to ask question and all questions were answered satisfactorily.   Disposition:  Pt is from Home, admitted with alcohol abuse for detox, still has withdrawal symptoms, which precludes a safe discharge. Discharge to home, when withdrawal symptoms will resolve, possible in few days. Started Librium tapering dose  Subjective: No overnight issues except that patient has insomnia, slept a few hours after Ambien.  He was requesting for some sleep medication.  Patient was also complaining of joint aches, he thinks that his rheumatoid arthritis is flaring up and he is requesting steroids to be started.  Patient was asking to change his psych medications so psychiatrist was consulted. Still has mild skin crawling symptoms, denied tremors are shaking.   Physical Exam: General:  alert oriented to time, place, and person.  Appear in mild distress, affect appropriate Eyes: PERRLA ENT: Oral Mucosa Clear, moist  Neck: no JVD,  Cardiovascular: S1 and S2 Present, no Murmur,  Respiratory: good respiratory effort, Bilateral Air entry equal and Decreased, n Crackles, on wheezes Abdomen: Bowel Sound present, Soft and no tenderness,  Skin: no rashes Extremities: no Pedal edema, no calf tenderness Neurologic: without any new focal findings, mild shaking right hand greater than the left hand. Gait not checked due to patient safety concerns  Vitals:   07/02/21 1945 07/03/21 0424 07/03/21 0800 07/03/21 1144  BP: 132/83 104/62 111/81 109/72  Pulse: 90 80 79 85  Resp: 18 16 16 18    Temp: 98.1 F (36.7 C) 98.4 F (36.9 C) 97.8 F (36.6 C) 97.6 F (36.4 C)  TempSrc: Oral Oral Oral   SpO2: 98% 94% 97% 94%  Weight:      Height:        Intake/Output Summary (Last 24 hours) at 07/03/2021 1412 Last data filed at 07/03/2021 1023 Gross per 24 hour  Intake 709.17 ml  Output --  Net 709.17 ml   Filed Weights   06/25/21 1710  Weight: 99.6 kg    Data Reviewed: I have personally reviewed and interpreted daily labs, tele strips, imagings as discussed above. I reviewed all nursing notes, pharmacy notes, vitals, pertinent old records I have discussed plan of care as described above with RN and patient/family.  CBC: Recent Labs  Lab 07/01/21 1301 07/02/21 0759 07/03/21 0412  WBC 4.4 5.2 6.1  HGB 13.8 13.4 13.2  HCT 43.5 40.8 41.6  MCV 91.0 88.5 90.8  PLT 219 241 263   Basic Metabolic Panel: Recent Labs  Lab 07/01/21 1301 07/02/21 0425 07/02/21 0759 07/03/21 0412  NA 138  --  138 140  K 4.6  --  3.9 3.6  CL 105  --  105 106  CO2 27  --  24 26  GLUCOSE 106*  --  82 87  BUN 8  --  8 6  CREATININE 0.94 0.87 0.81 0.70  CALCIUM 8.8*  --  8.4* 8.6*  MG 1.8  --  2.0 2.1  PHOS 3.0  --  4.0 4.9*  Studies: No results found.  Scheduled Meds:  busPIRone  15 mg Oral BID   chlordiazePOXIDE  25 mg Oral TID   Followed by   Melene Muller ON 07/04/2021] chlordiazePOXIDE  25 mg Oral BID   Followed by   Melene Muller ON 07/06/2021] chlordiazePOXIDE  10 mg Oral Daily   divalproex  250 mg Oral Q12H   enoxaparin (LOVENOX) injection  0.5 mg/kg Subcutaneous Q24H   FLUoxetine  20 mg Oral Daily   folic acid  1 mg Oral Daily   levothyroxine  50 mcg Oral QAC breakfast   melatonin  5 mg Oral QHS   multivitamin with minerals  1 tablet Oral Daily   [START ON 07/04/2021] predniSONE  50 mg Oral Q breakfast   Followed by   Melene Muller ON 07/09/2021] predniSONE  40 mg Oral Q breakfast   Followed by   Melene Muller ON 07/11/2021] predniSONE  30 mg Oral Q breakfast   Followed by   Melene Muller ON 07/13/2021]  predniSONE  20 mg Oral Q breakfast   Followed by   Melene Muller ON 07/15/2021] predniSONE  10 mg Oral Q breakfast   QUEtiapine  50 mg Oral QHS   thiamine  100 mg Oral Daily   Or   thiamine  100 mg Intravenous Daily   Vitamin D (Ergocalciferol)  50,000 Units Oral Q7 days   Continuous Infusions:  iron sucrose Stopped (07/03/21 0311)   PRN Meds: acetaminophen, LORazepam, LORazepam **OR** [DISCONTINUED] LORazepam, methocarbamol, metoprolol tartrate, ondansetron **OR** ondansetron (ZOFRAN) IV  Time spent: 35 minutes  Author: Gillis Santa. MD [facility] 07/03/2021 2:12 PM  To reach On-call, see care teams to locate the attending and reach out to them via [email or web address]. If 7PM-7AM, please contact night-coverage If you still have difficulty reaching the attending provider, please page the [facility] (Director on Call) for [facility] on amion for assistance.

## 2021-07-04 ENCOUNTER — Other Ambulatory Visit: Payer: Self-pay

## 2021-07-04 LAB — BASIC METABOLIC PANEL
Anion gap: 8 (ref 5–15)
BUN: 9 mg/dL (ref 6–20)
CO2: 27 mmol/L (ref 22–32)
Calcium: 8.8 mg/dL — ABNORMAL LOW (ref 8.9–10.3)
Chloride: 107 mmol/L (ref 98–111)
Creatinine, Ser: 0.75 mg/dL (ref 0.61–1.24)
GFR, Estimated: >60 mL/min (ref >60)
Glucose, Bld: 95 mg/dL (ref 70–99)
Potassium: 3.7 mmol/L (ref 3.5–5.1)
Sodium: 142 mmol/L (ref 135–145)

## 2021-07-04 LAB — CBC
HCT: 39.4 % (ref 39.0–52.0)
Hemoglobin: 12.9 g/dL — ABNORMAL LOW (ref 13.0–17.0)
MCH: 29.1 pg (ref 26.0–34.0)
MCHC: 32.7 g/dL (ref 30.0–36.0)
MCV: 88.9 fL (ref 80.0–100.0)
Platelets: 314 10*3/uL (ref 150–400)
RBC: 4.43 MIL/uL (ref 4.22–5.81)
RDW: 16.3 % — ABNORMAL HIGH (ref 11.5–15.5)
WBC: 7 10*3/uL (ref 4.0–10.5)
nRBC: 0 % (ref 0.0–0.2)

## 2021-07-04 LAB — MAGNESIUM: Magnesium: 2.2 mg/dL (ref 1.7–2.4)

## 2021-07-04 LAB — HEPATIC FUNCTION PANEL
ALT: 70 U/L — ABNORMAL HIGH (ref 0–44)
AST: 54 U/L — ABNORMAL HIGH (ref 15–41)
Albumin: 3.4 g/dL — ABNORMAL LOW (ref 3.5–5.0)
Alkaline Phosphatase: 47 U/L (ref 38–126)
Bilirubin, Direct: <0.1 mg/dL (ref 0.0–0.2)
Total Bilirubin: 0.5 mg/dL (ref 0.3–1.2)
Total Protein: 7.2 g/dL (ref 6.5–8.1)

## 2021-07-04 LAB — PHOSPHORUS: Phosphorus: 5 mg/dL — ABNORMAL HIGH (ref 2.5–4.6)

## 2021-07-04 MED ORDER — DIVALPROEX SODIUM 250 MG PO DR TAB
250.0000 mg | DELAYED_RELEASE_TABLET | Freq: Two times a day (BID) | ORAL | 0 refills | Status: DC
  Filled 2021-07-04: qty 60, 30d supply, fill #0

## 2021-07-04 MED ORDER — VITAMIN D (ERGOCALCIFEROL) 1.25 MG (50000 UNIT) PO CAPS
50000.0000 [IU] | ORAL_CAPSULE | ORAL | 0 refills | Status: DC
  Filled 2021-07-04: qty 4, 28d supply, fill #0

## 2021-07-04 MED ORDER — ADULT MULTIVITAMIN W/MINERALS CH
1.0000 | ORAL_TABLET | Freq: Every day | ORAL | 0 refills | Status: AC
  Filled 2021-07-04: qty 30, 30d supply, fill #0

## 2021-07-04 MED ORDER — VITAMIN B-1 100 MG PO TABS
100.0000 mg | ORAL_TABLET | Freq: Every day | ORAL | 0 refills | Status: AC
  Filled 2021-07-04: qty 30, 30d supply, fill #0

## 2021-07-04 MED ORDER — MELATONIN 5 MG PO TABS
5.0000 mg | ORAL_TABLET | Freq: Every day | ORAL | 0 refills | Status: AC
  Filled 2021-07-04: qty 30, 30d supply, fill #0

## 2021-07-04 MED ORDER — BUSPIRONE HCL 15 MG PO TABS
15.0000 mg | ORAL_TABLET | Freq: Two times a day (BID) | ORAL | 0 refills | Status: AC
  Filled 2021-07-04: qty 60, 30d supply, fill #0

## 2021-07-04 MED ORDER — PREDNISONE 10 MG PO TABS
ORAL_TABLET | ORAL | 0 refills | Status: DC
  Filled 2021-07-04: qty 21, 9d supply, fill #0

## 2021-07-04 MED ORDER — LEVOTHYROXINE SODIUM 50 MCG PO TABS
50.0000 ug | ORAL_TABLET | Freq: Every day | ORAL | 0 refills | Status: DC
  Filled 2021-07-04: qty 30, 30d supply, fill #0

## 2021-07-04 MED ORDER — FLUOXETINE HCL 20 MG PO CAPS
20.0000 mg | ORAL_CAPSULE | Freq: Every day | ORAL | 0 refills | Status: DC
  Filled 2021-07-04: qty 30, 30d supply, fill #0

## 2021-07-04 MED ORDER — QUETIAPINE FUMARATE 50 MG PO TABS
50.0000 mg | ORAL_TABLET | Freq: Every day | ORAL | 0 refills | Status: DC
  Filled 2021-07-04: qty 30, 30d supply, fill #0

## 2021-07-04 MED ORDER — SODIUM CHLORIDE 0.9 % IV SOLN
200.0000 mg | Freq: Once | INTRAVENOUS | Status: DC
  Filled 2021-07-04: qty 10

## 2021-07-04 MED ORDER — FERROUS SULFATE 325 (65 FE) MG PO TABS
325.0000 mg | ORAL_TABLET | Freq: Three times a day (TID) | ORAL | 0 refills | Status: DC
  Filled 2021-07-04: qty 90, 30d supply, fill #0

## 2021-07-04 NOTE — TOC Transition Note (Signed)
Transition of Care [facility]) - CM/SW Discharge Note   Patient Details  Name: Curtis Cobb MRN: [id number] Date of Birth: Aug 05, 1971  Transition of Care [facility]) CM/SW Contact:  Shelbie Hutching, RN Phone Number: 07/04/2021, 12:08 PM   Clinical Narrative:    [facility] team met with patient when he was first admitted and provided him with resources for [facility] all over [place] and for local residential treatment services.  Encouraged patient to call these places to see if he would be a good fit and if the facility would accept him.  TOC can not call on patient's behalf he needs to complete this on his own if he is serious and motivated.   TOC signed off.      Barriers to Discharge: Continued Medical Work up   Patient Goals and CMS Choice Patient states their goals for this hospitalization and ongoing recovery are:: To go for substance abuse treatment rehab      Discharge Placement                       Discharge Plan and Services   Discharge Planning Services: CM Consult, Medication Assistance, [facility]            DME Arranged: N/A         HH Arranged: NA HH Agency: NA        Social Determinants of Health (SDOH) Interventions     Readmission Risk Interventions No flowsheet data found.

## 2021-07-04 NOTE — Discharge Summary (Signed)
[facility] Discharge Summary   Patient: Curtis Cobb ZHG:[id number]  PCP: Patient, No Pcp Per (Inactive)  Date of admission: 06/25/2021   Date of discharge:  07/04/2021     Discharge Diagnoses:  Principal Problem:   Alcohol withdrawal (HCC) Active Problems:   Alcohol use disorder, severe, dependence (HCC)   Cannabis use disorder, moderate, dependence (HCC)   HTN (hypertension)   Rheumatoid arthritis (HCC)   Opioid use disorder, mild, in sustained remission (HCC)   MDD (major depressive disorder), recurrent episode, severe (HCC)   Tobacco use disorder   Hypothyroidism   Admitted From: Home Disposition:  Home   Recommendations for Outpatient Follow-up:  PCP: in 1 wk F/u Psych in 1 wk  Follow up LABS/TEST:     Diet recommendation: Regular diet  Activity: The patient is advised to gradually reintroduce usual activities, as tolerated  Discharge Condition: stable  Code Status: Full code   History of present illness: As per the H and P dictated on admission 50 y.o. male with medical history significant of alcohol abuse and dependence, anxiety disorder, schizoaffective disorder, hypertension, polysubstance abuse including narcotics and tobacco, cannabis abuse, who presented to the ER with intractable nausea vomiting abdominal pain. Patient is a chronic alcoholic who endorses consumption of 1/5 of liquor a day.  He has been drinking for many years.  He states he has withdrawn before but has not been hospitalized for it.  He has interest in stopping drinking.  He was initiated on withdrawal protocol, IV fluids, antiemetics in the ER with good result. Still demonstrating some mild symptoms of withdrawal including tremulousness and diffuse body aches Hospital Course:  # Intractable nausea and vomiting, Acute alcohol withdrawal, Suspected alcohol gastritis Patient is a chronic alcoholic, Drinks 1/5 of liquor a day. Has history of previous withdrawals, not requiring  hospitalization. 2 days since last drink at time of presentation, Is currently exhibiting some mild symptoms of withdrawal including tremulousness, lethargy, slightly altered mentation Seroquel stopped 7/1, Also complaining of body aches, improved. 7/5: Complains of nausea, headache, altered mentation.  Requiring fairly frequent Ativan. S/p CIWA protocol with as needed Ativan.  Patient was started on Librium tapering protocol, patient started feeling better.  Denied any nausea vomiting and no withdrawal symptoms.  Alcohol abstinence counseling was done.  Prescribed multivitamin and thiamine. # Schizoaffective disorder, Patient nonadherent with medications due to cost. Home medications include Seroquel and fluoxetine. Seroquel was held, Patient endorses Seroquel was causing mental status changes, Fluoxetine was resumed, on 7/7 psych resumed Seroquel 50 mg nightly for insomnia, patient's sleep improved, so prescribed Seroquel and fluoxetine on discharge.  Recommended to follow psych as an outpatient. # Essential hypertension, No medications at home.  BP remained stable during hospital stay monitor BP and follow with PCP. # Generalized anxiety disorder, Prescribed home BuSpar, not taking due to cost. On 7/7 psych increased BuSpar from 10 mg twice daily to 15 mg p.o. twice daily # Insomnia, Continue melatonin, Discontinued trazodone, started Seroquel 50 mg at bedtime, repeat once in an hour if not asleep # Depression, Has not been taking amitriptyline and Prozac due to cost. Prozac reinitiated and  Elavil d/c;d # History of rheumatoid arthritis, Does not appear to be on any treatment. On 7/7 complaining of generalized body ache and joint pain in upper extremities and bilateral knee joints, patient thinks that his RA is flaring up and requesting Decadron and prednisone. Decadron 4 mg IV x1 dose given, started prednisone tapering dose # Hypothyroid, TSH elevated, patient  is not taking Synthroid at home. Increase  Synthroid from 25 to 50 mcg p.o. daily, follow with PCP to repeat TSH level after 4 weeks # Hyponatremia, Likely secondary to alcohol intake # Polysubstance abuse, Counseled patient, Offer nicotine patch, TOC consult for substance abuse resources # Iron deficiency, iron saturation 8%.  s/p Venofer 200 mg IV daily and Started oral supplement on discharge. # Vitamin D insufficiency, started vitamin D supplement.  Follow with PCP to repeat vitamin D level after 3 to 6 months. Body mass index is 28.18 kg/m. Interventions:  - Patient was instructed, not to drive, operate heavy machinery, perform activities at heights, swimming or participation in water activities or provide baby sitting services while on Pain, Sleep and Anxiety Medications; until his outpatient Physician has advised to do so again.  - Also recommended to not to take more than prescribed Pain, Sleep and Anxiety Medications.  Patient was ambulatory without any assistance. On the day of the discharge the patient's vitals were stable, and no other acute medical condition were reported by patient. the patient was felt safe to be discharge at Home.  Consultants: Psychiatry Procedures: None  Discharge Exam: General: Appear in no distress, no Rash; Oral Mucosa Clear, moist. Cardiovascular: S1 and S2 Present, no Murmur, Respiratory: normal respiratory effort, Bilateral Air entry present and no Crackles, no wheezes Abdomen: Bowel Sound present, Soft and no tenderness, no hernia Extremities: no Pedal edema, no calf tenderness Neurology: alert and oriented to time, place, and person affect appropriate.  Filed Weights   06/25/21 1710  Weight: 99.6 kg   Vitals:   07/03/21 1931 07/04/21 0636  BP: 91/64 116/69  Pulse: 96 76  Resp: 20 16  Temp: 97.9 F (36.6 C) 98.2 F (36.8 C)  SpO2: 100% 93%    DISCHARGE MEDICATION: Allergies as of 07/04/2021       Reactions   Sulfa Antibiotics Other (See Comments)   Unknown reaction         Medication List     STOP taking these medications    amitriptyline 100 MG tablet Commonly known as: ELAVIL   naltrexone 50 MG tablet Commonly known as: DEPADE   prazosin 1 MG capsule Commonly known as: MINIPRESS       TAKE these medications    busPIRone 15 MG tablet Commonly known as: BUSPAR Take 1 tablet (15 mg total) by mouth 2 (two) times daily. What changed:  medication strength how much to take   divalproex 250 MG DR tablet Commonly known as: DEPAKOTE Take 1 tablet (250 mg total) by mouth every 12 (twelve) hours. What changed:  medication strength how much to take   ferrous sulfate 325 (65 FE) MG tablet Take 1 tablet (325 mg total) by mouth 3 (three) times daily with meals.   FLUoxetine 20 MG capsule Commonly known as: PROZAC Take 1 capsule (20 mg total) by mouth daily. What changed: Another medication with the same name was removed. Continue taking this medication, and follow the directions you see here.   levothyroxine 50 MCG tablet Commonly known as: SYNTHROID Take 1 tablet (50 mcg total) by mouth daily before breakfast. Start taking on: July 05, 2021 What changed:  medication strength how much to take   melatonin 5 MG Tabs Take 1 tablet (5 mg total) by mouth at bedtime.   multivitamin with minerals Tabs tablet Take 1 tablet by mouth daily. Start taking on: July 05, 2021   predniSONE 10 MG (21) Tbpk tablet Commonly known as: 67  UNI-PAK 21 TAB 40 mg p.o. daily x2 days, 30 mg p.o. daily x2 days, 20 mg p.o. daily x2 days, 10 mg p.o. daily x3 days   QUEtiapine 50 MG tablet Commonly known as: SEROQUEL Take 1 tablet (50 mg total) by mouth at bedtime. What changed:  medication strength how much to take   thiamine 100 MG tablet Take 1 tablet (100 mg total) by mouth daily. Start taking on: July 05, 2021   Vitamin D (Ergocalciferol) 1.25 MG (50000 UNIT) Caps capsule Commonly known as: DRISDOL Take 1 capsule (50,000 Units total) by  mouth every 7 (seven) days. Start taking on: July 10, 2021       Allergies  Allergen Reactions   Sulfa Antibiotics Other (See Comments)    Unknown reaction   Discharge Instructions     Call MD for:  difficulty breathing, headache or visual disturbances   Complete by: As directed    Call MD for:  extreme fatigue   Complete by: As directed    Call MD for:  persistant dizziness or light-headedness   Complete by: As directed    Call MD for:  persistant nausea and vomiting   Complete by: As directed    Call MD for:  severe uncontrolled pain   Complete by: As directed    Call MD for:  temperature >100.4   Complete by: As directed    Diet - low sodium heart healthy   Complete by: As directed    Discharge instructions   Complete by: As directed    Follow-up with PCP in 1 week Follow with psychiatrist in 1 week   Increase activity slowly   Complete by: As directed        The results of significant diagnostics from this hospitalization (including imaging, microbiology, ancillary and laboratory) are listed below for reference.    Significant Diagnostic Studies: CT ABDOMEN PELVIS W CONTRAST  Result Date: 06/25/2021 CLINICAL DATA:  Abdominal pain with nausea and vomiting for 8 days EXAM: CT ABDOMEN AND PELVIS WITH CONTRAST TECHNIQUE: Multidetector CT imaging of the abdomen and pelvis was performed using the standard protocol following bolus administration of intravenous contrast. CONTRAST:  OMNIPAQUE IOHEXOL 300 MG/ML  SOLN COMPARISON:  03/25/2016 FINDINGS: Lower chest: Small type 1 hiatal hernia. Adjacent 0.8 cm paraesophageal lymph node on image 15 series 2, formerly 0.7 cm. Hepatobiliary: Diffuse hepatic steatosis.  Otherwise unremarkable. Pancreas: Borderline prominence of caliber in the dorsal pancreatic duct in the pancreatic head and body but not in the pancreatic tail. This is a similar appearance to the 03/25/2016 exam. Spleen: Unremarkable Adrenals/Urinary Tract: Small  hypodense right renal lesions are statistically likely to be small cysts although technically nonspecific due to small size. Adrenal glands normal. Stomach/Bowel: Small type 1 hiatal hernia. Sigmoid colon diverticulosis. Vascular/Lymphatic: Right external iliac node borderline prominent at 1.0 cm in short axis on image 82 series 2, formerly the same. Likewise there is a stable 1.0 cm left external iliac lymph node on image 80 series 2. Reproductive: Unremarkable Other: No supplemental non-categorized findings. 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 Lower lumbar spondylosis and degenerative disc disease causing left foraminal impingement at L4-5 and bilateral foraminal impingement at L5-S1. IMPRESSION: 1. A specific cause for the patient's recent abdominal pain with nausea and vomiting is not identified. 2. Other imaging findings of potential clinical significance: Small type 1 hiatal hernia. Diffuse hepatic steatosis. Chronic borderline prominence of the dorsal pancreatic duct in the pancreatic  head and body. Sigmoid colon diverticulosis. Impingement at L4-5 and L5-S1. Electronically Signed   By: Gaylyn Rong M.D.   On: 06/25/2021 18:48   US ABDOMEN LIMITED RUQ (LIVER/GB)  Result Date: 06/25/2021 CLINICAL DATA:  Right upper quadrant pain with nausea and vomiting x8 days. EXAM: ULTRASOUND ABDOMEN LIMITED RIGHT UPPER QUADRANT COMPARISON:  None. FINDINGS: Gallbladder: Echogenic sludge is seen within the gallbladder lumen. No gallstones or wall thickening visualized (2.5 mm). No sonographic Murphy sign noted by sonographer. Common bile duct: Diameter: 5.4 mm Liver: No focal lesion identified. Heterogeneous, diffusely increased echogenicity of the liver parenchyma is seen. Portal vein is patent on color Doppler imaging with normal direction of blood flow towards the liver. Other: None. IMPRESSION: 1. Gallbladder sludge without evidence of  cholelithiasis or acute cholecystitis. 2. Fatty liver. Electronically Signed   By: Aram Candela M.D.   On: 06/25/2021 22:42    Microbiology: No results found for this or any previous visit (from the past 240 hour(s)).   Labs: CBC: Recent Labs  Lab 07/01/21 1301 07/02/21 0759 07/03/21 0412 07/04/21 0524  WBC 4.4 5.2 6.1 7.0  HGB 13.8 13.4 13.2 12.9*  HCT 43.5 40.8 41.6 39.4  MCV 91.0 88.5 90.8 88.9  PLT 219 241 263 314   Basic Metabolic Panel: Recent Labs  Lab 07/01/21 1301 07/02/21 0425 07/02/21 0759 07/03/21 0412 07/04/21 0524  NA 138  --  138 140 142  K 4.6  --  3.9 3.6 3.7  CL 105  --  105 106 107  CO2 27  --  24 26 27   GLUCOSE 106*  --  82 87 95  BUN 8  --  8 6 9   CREATININE 0.94 0.87 0.81 0.70 0.75  CALCIUM 8.8*  --  8.4* 8.6* 8.8*  MG 1.8  --  2.0 2.1 2.2  PHOS 3.0  --  4.0 4.9* 5.0*   Liver Function Tests: Recent Labs  Lab 07/01/21 1301 07/02/21 0759 07/03/21 0412 07/04/21 0524  AST 136* 89* 63* 54*  ALT 118* 90* 76* 70*  ALKPHOS 60 51 47 47  BILITOT 0.5 0.4 0.4 0.5  PROT 7.2 6.7 6.7 7.2  ALBUMIN 3.3* 3.0* 3.2* 3.4*   No results for input(s): LIPASE, AMYLASE in the last 168 hours. No results for input(s): AMMONIA in the last 168 hours. Cardiac Enzymes: No results for input(s): CKTOTAL, CKMB, CKMBINDEX, TROPONINI in the last 168 hours. BNP (last 3 results) No results for input(s): BNP in the last 8760 hours. CBG: No results for input(s): GLUCAP in the last 168 hours.  Time spent: 35 minutes  Signed:  Gillis Santa  [facility]  07/04/2021 11:06 AM

## 2021-07-04 NOTE — Progress Notes (Signed)
Received MD order to discharge patient to home, reviewed discharge instructions, home meds, prescriptions and follow up appointments with patient and patient verbalized understanding   

## 2021-07-08 ENCOUNTER — Telehealth: Payer: Self-pay | Admitting: Pharmacy Technician

## 2021-07-08 NOTE — Telephone Encounter (Signed)
Patient received a 30 day supply of medication.  Provided patient with new patient packet to obtain ongoing [facility] services.  [facility] must receive requested financial documentation within 30 days in order to determine eligibility and provide additional medication assistance.  Curtis Cobb J. Curtis Cobb Care Manager [facility] 

## 2021-07-21 ENCOUNTER — Emergency Department
Admission: EM | Admit: 2021-07-21 | Discharge: 2021-07-21 | Disposition: A | Discharge status: Home / Self Care | Payer: Medicaid Other | Attending: Emergency Medicine | Admitting: Emergency Medicine

## 2021-07-21 ENCOUNTER — Other Ambulatory Visit: Payer: Self-pay

## 2021-07-21 DIAGNOSIS — Z79899 Other long term (current) drug therapy: Secondary | ICD-10-CM | POA: Insufficient documentation

## 2021-07-21 DIAGNOSIS — M069 Rheumatoid arthritis, unspecified: Secondary | ICD-10-CM

## 2021-07-21 DIAGNOSIS — I10 Essential (primary) hypertension: Secondary | ICD-10-CM | POA: Insufficient documentation

## 2021-07-21 DIAGNOSIS — E039 Hypothyroidism, unspecified: Secondary | ICD-10-CM | POA: Insufficient documentation

## 2021-07-21 DIAGNOSIS — Z8616 Personal history of COVID-19: Secondary | ICD-10-CM | POA: Insufficient documentation

## 2021-07-21 MED ORDER — KETOROLAC TROMETHAMINE 30 MG/ML IJ SOLN
30.0000 mg | Freq: Once | INTRAMUSCULAR | Status: AC
  Administered 2021-07-21: 30 mg via INTRAMUSCULAR
  Filled 2021-07-21: qty 1

## 2021-07-21 MED ORDER — DEXAMETHASONE SODIUM PHOSPHATE 10 MG/ML IJ SOLN
10.0000 mg | Freq: Once | INTRAMUSCULAR | Status: AC
  Administered 2021-07-21: 10 mg via INTRAMUSCULAR
  Filled 2021-07-21: qty 1

## 2021-07-21 MED ORDER — PREDNISONE 10 MG PO TABS
ORAL_TABLET | ORAL | 0 refills | Status: DC
  Filled 2021-07-21: qty 21, 6d supply, fill #0

## 2021-07-21 NOTE — ED Notes (Signed)
See triage note  Presents with RA flare  States this episode started about 4 days ago  Has tried IBU with min relief

## 2021-07-21 NOTE — ED Triage Notes (Signed)
Pt c/o joint pain all over, states he has a hx of RA was dx in 2015 has not had any issues until 2 months ago had a flare up and again the past several days. Pt is in NAD, ambulatory to triage with a steady gait

## 2021-07-21 NOTE — ED Provider Notes (Signed)
Rheumatoid  [facility] Emergency Department Provider Note  ____________________________________________   Event Date/Time   First MD Initiated Contact with Patient 07/21/21 1128     (approximate)  I have reviewed the triage vital signs and the nursing notes.   HISTORY  Chief Complaint Joint Pain   HPI Curtis Cobb is a 50 y.o. male presents to the ED with complaint of arthritis flareup.  Patient states he was diagnosed in 2015 and is waiting to see a rheumatologist and rageful which is still several months out.  Was seen here 2 months ago at which time he states the medication he was given helped a great deal.  He denies any tick bites and states that this feels like his normal rheumatoid flareup.  Currently rates his pain as an 8 out of 10.        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   MDD (major depressive disorder), recurrent episode, severe (HCC) 11/17/2015   Opioid use disorder, mild, in sustained remission (HCC) 10/08/2015   Alcohol use disorder, severe, dependence (HCC) 10/07/2015   Cannabis use disorder, moderate, dependence (HCC) 10/07/2015   HTN (hypertension) 10/07/2015   Rheumatoid arthritis (HCC) 10/07/2015    Past Surgical History:  Procedure Laterality Date   INCISION AND DRAINAGE ABSCESS Right 08/25/2018   Procedure: INCISION AND DRAINAGE ABSCESS;  Surgeon: Christena Flake, MD;  Location: [facility] ORS;   Service: Orthopedics;  Laterality: Right;   INCISION AND DRAINAGE ABSCESS Right 08/30/2018   Procedure: INCISION AND DRAINAGE ABSCESS;  Surgeon: Christena Flake, MD;  Location: [facility] ORS;  Service: Orthopedics;  Laterality: Right;   none      Prior to Admission medications   Medication Sig Start Date End Date Taking? Authorizing Provider  busPIRone (BUSPAR) 15 MG tablet Take 1 tablet (15 mg total) by mouth 2 (two) times daily. 07/04/21 08/03/21  Gillis Santa, MD  divalproex (DEPAKOTE) 250 MG DR tablet Take 1 tablet (250 mg total) by mouth every 12 (twelve) hours. 07/04/21 08/03/21  Gillis Santa, MD  ferrous sulfate 325 (65 FE) MG tablet Take 1 tablet (325 mg total) by mouth 3 (three) times daily with meals. 07/04/21 08/03/21  Gillis Santa, MD  FLUoxetine (PROZAC) 20 MG capsule Take 1 capsule (20 mg total) by mouth once daily. 07/04/21 08/03/21  Gillis Santa, MD  levothyroxine (SYNTHROID) 50 MCG tablet Take 1 tablet (50 mcg total) by mouth daily before breakfast. 07/05/21 08/04/21  Gillis Santa, MD  melatonin 5 MG TABS Take 1 tablet (5 mg total) by mouth at bedtime. 07/04/21 08/03/21  Gillis Santa, MD  Multiple Vitamin (MULTIVITAMIN WITH MINERALS) TABS tablet Take 1 tablet by mouth daily. 07/05/21 08/04/21  Gillis Santa, MD  predniSONE (STERAPRED UNI-PAK 21 TAB) 10 MG (21) TBPK tablet Taper as directed 07/21/21  Yes Levada Schilling, Lyrica Mcclarty L, PA-C  QUEtiapine (SEROQUEL) 50 MG tablet Take 1 tablet (50 mg total) by mouth once daily at bedtime.  07/04/21 08/03/21  Gillis Santa, MD  thiamine (VITAMIN B-1) 100 MG tablet Take 1 tablet (100 mg total) by mouth once daily. 07/05/21 08/04/21  Gillis Santa, MD  Vitamin D, Ergocalciferol, (DRISDOL) 1.25 MG (50000 UNIT) CAPS capsule Take 1 capsule (50,000 Units total) by mouth every 7 (seven) days. 07/10/21 10/08/21  Gillis Santa, MD    Allergies Sulfa antibiotics  Family History  Problem Relation Age of Onset   Heart attack Mother    Stroke Mother    Hypertension Mother    Mental illness  Mother        Manic Depressive Bipolar Disorder   Heart disease Mother    Hypertension Maternal Aunt    Mental illness Maternal Aunt    Hypertension Maternal Aunt    Mental illness Maternal Aunt     Social History Social History   Tobacco Use   Smoking status: Never   Smokeless tobacco: Never  Substance Use Topics   Alcohol use: Yes    Comment: beer 6 hours ago 03/28/2021   Drug use: No    Types: Amphetamines, Marijuana, Cocaine, Heroin    Comment: Patient denies recent drug use.    Review of Systems Constitutional: No fever/chills Eyes: No visual changes. ENT: No sore throat. Cardiovascular: Denies chest pain. Respiratory: Denies shortness of breath. Gastrointestinal: No abdominal pain.  No nausea, no vomiting.  No diarrhea.  Musculoskeletal: Positive for multiple joint aches. Skin: Negative for rash. Neurological: Negative for headaches, focal weakness or numbness. ____________________________________________   PHYSICAL EXAM:  VITAL SIGNS: ED Triage Vitals  Enc Vitals Group     BP 07/21/21 0936 131/87     Pulse Rate 07/21/21 0936 88     Resp 07/21/21 0936 17     Temp 07/21/21 0937 98 F (36.7 C)     Temp Source 07/21/21 0936 Oral     SpO2 07/21/21 0936 100 %     Weight 07/21/21 0936 220 lb (99.8 kg)     Height 07/21/21 0936  (1.88 m)     Head Circumference --      Peak Flow --      Pain Score 07/21/21 0936 8     Pain Loc --      Pain Edu? --      Excl. in GC? --     Constitutional: Alert and oriented. Well appearing and in no acute distress. Eyes: Conjunctivae are normal.  Head: Atraumatic. Neck: No stridor.   Cardiovascular: Normal rate, regular rhythm. Grossly normal heart sounds.  Good peripheral circulation. Respiratory: Normal respiratory effort.  No retractions. Lungs CTAB. Gastrointestinal: Soft and nontender. No distention.  Musculoskeletal: No gross deformity is noted.  Patient is ambulatory without any assistance. Neurologic:  Normal  speech and language. No gross focal neurologic deficits are appreciated. No gait instability. Skin:  Skin is warm, dry and intact. No rash noted. Psychiatric: Mood and affect are normal. Speech and behavior are normal.  ____________________________________________   LABS (all labs ordered are listed, but only abnormal results are displayed)  Labs Reviewed - No data to display ____________________________________________ ____________________________________________   PROCEDURES  Procedure(s) performed (including Critical Care):  Procedures   ____________________________________________   INITIAL IMPRESSION / ASSESSMENT AND PLAN / ED COURSE  As part of my medical decision making, I reviewed the following data within the electronic MEDICAL RECORD NUMBER Notes from prior ED visits and [place] Controlled Substance Database  50 year old male presents to the ED with complaint of flareup of his rheumatoid arthritis.  Patient currently is waiting for his appointment with a rheumatologist in [place].  Currently he states that his joints are hurting all over.  He was seen in the emergency department 2 months ago at which time medication that was given helped a great deal.  Patient was given Toradol 30 mg IM, Decadron 10 mg IM and started on a prednisone taper. ____________________________________________   FINAL CLINICAL IMPRESSION(S) / ED DIAGNOSES  Final diagnoses:  Rheumatoid arthritis flare (HCC)     ED Discharge Orders          Ordered    predniSONE (STERAPRED UNI-PAK 21 TAB) 10 MG (21) TBPK tablet        07/21/21 1223             Note:  This document was prepared using Dragon voice recognition software and may include unintentional dictation errors.    Tommi Rumps, PA-C 07/21/21 1226    Jene Every, MD 07/21/21 [phone]

## 2021-07-21 NOTE — Discharge Instructions (Addendum)
Keep your appointment with your rheumatologist as previously scheduled.  The prednisone pack was sent to the pharmacy to begin taking.  You may also take Tylenol specifically for pain if additional pain medication is needed.

## 2021-07-28 ENCOUNTER — Other Ambulatory Visit: Payer: Self-pay

## 2021-07-29 ENCOUNTER — Other Ambulatory Visit: Payer: Self-pay

## 2021-07-29 ENCOUNTER — Emergency Department
Admission: EM | Admit: 2021-07-29 | Discharge: 2021-07-29 | Disposition: A | Discharge status: Home / Self Care | Payer: Medicaid Other | Attending: Emergency Medicine | Admitting: Emergency Medicine

## 2021-07-29 ENCOUNTER — Encounter: Payer: Self-pay | Admitting: Emergency Medicine

## 2021-07-29 DIAGNOSIS — Z79899 Other long term (current) drug therapy: Secondary | ICD-10-CM | POA: Insufficient documentation

## 2021-07-29 DIAGNOSIS — E039 Hypothyroidism, unspecified: Secondary | ICD-10-CM | POA: Insufficient documentation

## 2021-07-29 DIAGNOSIS — M1990 Unspecified osteoarthritis, unspecified site: Secondary | ICD-10-CM | POA: Insufficient documentation

## 2021-07-29 DIAGNOSIS — I10 Essential (primary) hypertension: Secondary | ICD-10-CM | POA: Insufficient documentation

## 2021-07-29 DIAGNOSIS — Z8616 Personal history of COVID-19: Secondary | ICD-10-CM | POA: Insufficient documentation

## 2021-07-29 MED ORDER — KETOROLAC TROMETHAMINE 60 MG/2ML IM SOLN
60.0000 mg | Freq: Once | INTRAMUSCULAR | Status: AC
  Administered 2021-07-29: 60 mg via INTRAMUSCULAR
  Filled 2021-07-29: qty 2

## 2021-07-29 MED ORDER — PREDNISONE 10 MG PO TABS
50.0000 mg | ORAL_TABLET | Freq: Every day | ORAL | 0 refills | Status: DC
  Filled 2021-07-29: qty 20, 4d supply, fill #0

## 2021-07-29 MED ORDER — PREDNISONE 20 MG PO TABS
50.0000 mg | ORAL_TABLET | Freq: Once | ORAL | Status: AC
  Administered 2021-07-29: 50 mg via ORAL
  Filled 2021-07-29: qty 3

## 2021-07-29 NOTE — ED Triage Notes (Signed)
Hx of RA   states he is having joint pain

## 2021-07-29 NOTE — ED Provider Notes (Signed)
[facility] Emergency Department Provider Note  ____________________________________________   Event Date/Time   First MD Initiated Contact with Patient 07/29/21 1242     (approximate)  I have reviewed the triage vital signs and the nursing notes.   HISTORY  Chief Complaint Joint Pain   HPI Curtis Cobb is a 50 y.o. male with a past medical history of HTN, depression, anxiety, substance abuse and alcohol use disorder currently in inpatient rehab as well as remote diagnosis of rheumatoid arthritis have not yet established care with rheumatology who presents for assessment of some persistent pain and "all my joints" including in the shoulders, elbows, wrists, hips, knees and ankles.  Patient states he has been going on for several weeks and that he does not have an appointment to see rheumatology since October.  He states he was seen in the emergency room last month on the 25th and prescribed a short course of steroids which initially helped but then his pain started coming back when the steroids were stopped.  He denies any new headache, earache, sore throat, fevers, cough, chest pain abdominal pain, nausea, vomiting, rash or recent injuries or falls.  Denies any other acute symptoms and states the pain is very similar to pain he has had on and off for the last couple years.  Denies any recent illicit drug use and states he has been alcohol free for little over 30 days.  States inpatient alcohol detox program is been working well for him.         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   Sedative, hypnotic or anxiolytic use disorder, severe, 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   MDD (major depressive disorder), recurrent episode, severe (HCC) 11/17/2015   Opioid use disorder, mild, in sustained remission (HCC) 10/08/2015   Alcohol use disorder, severe, dependence (HCC) 10/07/2015   Cannabis use disorder, moderate, dependence (HCC) 10/07/2015   HTN (hypertension) 10/07/2015   Rheumatoid arthritis (HCC) 10/07/2015    Past Surgical History:  Procedure Laterality Date   INCISION AND DRAINAGE ABSCESS Right 08/25/2018   Procedure: INCISION AND DRAINAGE ABSCESS;  Surgeon: Christena Flake, MD;  Location: [facility] ORS;  Service: Orthopedics;  Laterality: Right;   INCISION AND DRAINAGE ABSCESS Right 08/30/2018   Procedure: INCISION AND DRAINAGE ABSCESS;  Surgeon: Christena Flake, MD;  Location: [facility] ORS;  Service: Orthopedics;  Laterality: Right;   none      Prior to Admission medications   Medication Sig Start Date End Date Taking? Authorizing Provider  predniSONE (DELTASONE) 10 MG tablet Take 5 tablets (50 mg total) by mouth daily for 4 days. 07/29/21 08/02/21 Yes Gilles Chiquito, MD  busPIRone (BUSPAR) 15 MG tablet Take 1 tablet (15 mg total) by mouth 2 (two) times daily. 07/04/21 08/03/21  Gillis Santa, MD  divalproex (DEPAKOTE) 250 MG DR tablet Take 1 tablet (250 mg total) by mouth every 12 (twelve) hours. 07/04/21 08/03/21  Gillis Santa, MD  ferrous sulfate 325 (65 FE) MG tablet Take 1 tablet (  325 mg total) by mouth 3 (three) times daily with meals. 07/04/21 08/03/21  Gillis Santa, MD  FLUoxetine (PROZAC) 20 MG capsule Take 1 capsule (20 mg total) by mouth once daily. 07/04/21 08/03/21  Gillis Santa, MD  levothyroxine (SYNTHROID) 50 MCG tablet Take 1 tablet (50 mcg total) by mouth daily before breakfast. 07/05/21 08/04/21  Gillis Santa, MD  melatonin 5 MG TABS Take 1 tablet (5 mg total) by mouth at bedtime. 07/04/21 08/03/21  Gillis Santa, MD   Multiple Vitamin (MULTIVITAMIN WITH MINERALS) TABS tablet Take 1 tablet by mouth daily. 07/05/21 08/04/21  Gillis Santa, MD  QUEtiapine (SEROQUEL) 50 MG tablet Take 1 tablet (50 mg total) by mouth once daily at bedtime. 07/04/21 08/03/21  Gillis Santa, MD  thiamine (VITAMIN B-1) 100 MG tablet Take 1 tablet (100 mg total) by mouth once daily. 07/05/21 08/04/21  Gillis Santa, MD  Vitamin D, Ergocalciferol, (DRISDOL) 1.25 MG (50000 UNIT) CAPS capsule Take 1 capsule (50,000 Units total) by mouth every 7 (seven) days. 07/10/21 10/08/21  Gillis Santa, MD    Allergies Sulfa antibiotics  Family History  Problem Relation Age of Onset   Heart attack Mother    Stroke Mother    Hypertension Mother    Mental illness Mother        Manic Depressive Bipolar Disorder   Heart disease Mother    Hypertension Maternal Aunt    Mental illness Maternal Aunt    Hypertension Maternal Aunt    Mental illness Maternal Aunt     Social History Social History   Tobacco Use   Smoking status: Never   Smokeless tobacco: Never  Substance Use Topics   Alcohol use: Yes    Comment: beer 6 hours ago 03/28/2021   Drug use: No    Types: Amphetamines, Marijuana, Cocaine, Heroin    Comment: Patient denies recent drug use.    Review of Systems  Review of Systems  Constitutional:  Negative for chills and fever.  HENT:  Negative for sore throat.   Eyes:  Negative for pain.  Respiratory:  Negative for cough and stridor.   Cardiovascular:  Negative for chest pain.  Gastrointestinal:  Negative for vomiting.  Genitourinary:  Negative for dysuria.  Musculoskeletal:  Positive for joint pain.  Skin:  Negative for rash.  Neurological:  Negative for seizures, loss of consciousness and headaches.  Psychiatric/Behavioral:  Negative for suicidal ideas.   All other systems reviewed and are negative.    ____________________________________________   PHYSICAL EXAM:  VITAL SIGNS: ED Triage Vitals  Enc Vitals Group     BP  07/29/21 1145 122/87     Pulse Rate 07/29/21 1145 (!) 104     Resp 07/29/21 1145 16     Temp 07/29/21 1145 98.2 F (36.8 C)     Temp Source 07/29/21 1145 Oral     SpO2 07/29/21 1145 99 %     Weight 07/29/21 1144 220 lb (99.8 kg)     Height 07/29/21 1144 6\' 2"  (1.88 m)     Head Circumference --      Peak Flow --      Pain Score --      Pain Loc --      Pain Edu? --      Excl. in GC? --    Vitals:   07/29/21 1145 07/29/21 1313  BP: 122/87 118/89  Pulse: (!) 104 98  Resp: 16 18  Temp: 98.2 F (36.8 C)   SpO2: 99% 98%  Physical Exam Vitals and nursing note reviewed.  Constitutional:      Appearance: He is well-developed.  HENT:     Head: Normocephalic and atraumatic.     Right Ear: External ear normal.     Left Ear: External ear normal.     Nose: Nose normal.  Eyes:     Conjunctiva/sclera: Conjunctivae normal.  Cardiovascular:     Rate and Rhythm: Regular rhythm.     Pulses: Normal pulses.     Heart sounds: No murmur heard. Pulmonary:     Effort: Pulmonary effort is normal. No respiratory distress.     Breath sounds: Normal breath sounds.  Abdominal:     Palpations: Abdomen is soft.     Tenderness: There is no abdominal tenderness.  Musculoskeletal:     Cervical back: Neck supple.  Skin:    General: Skin is warm and dry.     Capillary Refill: Capillary refill takes less than 2 seconds.     Findings: No lesion.  Neurological:     Mental Status: He is alert and oriented to person, place, and time.  Psychiatric:        Mood and Affect: Mood normal.    2+ radial pulses.  There is no effusion deformity or significant tenderness with bilateral shoulders, elbows or wrists.  No other deformity or significant tenderness of the lower extremities.  Patient is able to ambulate unassisted. ____________________________________________   LABS (all labs ordered are listed, but only abnormal results are displayed)  Labs Reviewed - No data to  display ____________________________________________  EKG  ____________________________________________  RADIOLOGY  ED MD interpretation:    Official radiology report(s): No results found.  ____________________________________________   PROCEDURES  Procedure(s) performed (including Critical Care):  Procedures   ____________________________________________   INITIAL IMPRESSION / ASSESSMENT AND PLAN / ED COURSE      Patient presents with above to history exam for assessment of fairly significant diffuse joint pain is all extremities.  Seems she has had a waxing and waning but progressive pain in his extremities that seem to initially respond to short course of steroids he received last week.  States he has appointment see rheumatology in October but the pain is unbearable.  He denies any constitutional or other acute associated sick symptoms.  On exam his joints appear largely unremarkable.  Very low suspicion at this time for septic joints, bacteremia, acute injury or other immediate life-threatening process.  There is no rash, effusion, fever or other historical or exam features to suggest acute gonococcal infection.  We will give a dose of IM Toradol and prescribe short course of prednisone.  We will also give referral for renal clinic so patient can establish PCP care as well as give contact information for rheumatology to see if there is any way patient can get in sooner.  Discharged stable condition.  Strict return precautions advised and discussed.        ____________________________________________   FINAL CLINICAL IMPRESSION(S) / ED DIAGNOSES  Final diagnoses:  Arthritis    Medications  ketorolac (TORADOL) injection 60 mg (60 mg Intramuscular Given 07/29/21 1311)  predniSONE (DELTASONE) tablet 50 mg (50 mg Oral Given 07/29/21 1311)     ED Discharge Orders          Ordered    predniSONE (DELTASONE) 10 MG tablet  Daily        07/29/21 1304              Note:  This document was prepared  using Conservation officer, historic buildings and may include unintentional dictation errors.    Gilles Chiquito, MD 07/29/21 [phone]

## 2021-08-05 ENCOUNTER — Telehealth: Payer: Self-pay

## 2021-08-05 NOTE — Telephone Encounter (Signed)
An attempt to contact the pt was made, but the phone number on file is invalid.

## 2021-08-05 NOTE — Telephone Encounter (Signed)
-----   Message from Elberta Fortis sent at 07/31/2021  9:52 AM EDT ----- Regarding: FW: New patient referral Please call patient ----- Message ----- From: Allayne Butcher, RN Sent: 06/27/2021  12:43 PM EDT To: Remi Haggard Subject: New patient referral                           Patient has no insurance and no PCP - he will need follow up after discharge- application given to him to complete.  Please reach out to him.

## 2021-08-07 ENCOUNTER — Other Ambulatory Visit: Payer: Self-pay

## 2021-08-07 ENCOUNTER — Ambulatory Visit: Payer: Medicaid Other | Admitting: Gerontology

## 2021-08-07 ENCOUNTER — Encounter: Payer: Self-pay | Admitting: Gerontology

## 2021-08-07 VITALS — BP 121/78 | HR 89 | Temp 97.2°F | Resp 18 | Ht 73.5 in | Wt 210.6 lb

## 2021-08-07 DIAGNOSIS — E039 Hypothyroidism, unspecified: Secondary | ICD-10-CM

## 2021-08-07 DIAGNOSIS — M069 Rheumatoid arthritis, unspecified: Secondary | ICD-10-CM

## 2021-08-07 DIAGNOSIS — Z7689 Persons encountering health services in other specified circumstances: Secondary | ICD-10-CM

## 2021-08-07 MED ORDER — PREDNISONE 20 MG PO TABS
20.0000 mg | ORAL_TABLET | Freq: Every day | ORAL | 0 refills | Status: DC
  Filled 2021-08-07 – 2021-08-08 (×2): qty 7, 7d supply, fill #0

## 2021-08-07 NOTE — Progress Notes (Signed)
New Patient Office Visit  Subjective:  Patient ID: Curtis Cobb, male    DOB: 1971/09/09  Age: 50 y.o. MRN: [id number]  CC:  Chief Complaint  Patient presents with   Establish Care    HPI Curtis Cobb is a 50 y/o male who has history of Alcohol use disorder, Anxiety, Depression, Hypertension, Rheumatoid arthritis, presents to establish care and evaluation of his chronic condition. He resides at [facility] for substance abuse rehabilitation. He was seen at the ED on 07/29/21 for joint pain. Currently, he states that he's having severe joint pain to his shoulders, ankles and feet. He describes pain as constant sharp, burning sensation 7/10. He states that it affects his walking and performing activities. He reports that taking Naproxen and Ibuprofen did not improve his symptoms. He states that he has not taking any medication for 2 years, but currently, he started experiencing flare up with his RA. He has a history of Hypothyroidism, and he self discontinued his Levothyroxine stating that he "feels better without taking his Levothyroxine".  His current TSH done on 07/02/21 was 12.989. He states that his mood is good, he denies suicidal/homicidal  ideation. Overall, he states that he's doing well and offers no further complaint.  Past Medical History:  Diagnosis Date   Alcohol use disorder, moderate, dependence (HCC) 02/13/2014   Anxiety    COVID-19 03/29/2021   Diagnosed in [facility] ED on 03/29/2021   Depression    Hypertension    Rheumatoid arthritis (HCC)    Substance abuse [facility])     Past Surgical History:  Procedure Laterality Date   INCISION AND DRAINAGE ABSCESS Right 08/25/2018   Procedure: INCISION AND DRAINAGE ABSCESS;  Surgeon: Christena Flake, MD;  Location: [facility] ORS;  Service: Orthopedics;  Laterality: Right;   INCISION AND DRAINAGE ABSCESS Right 08/30/2018   Procedure: INCISION AND DRAINAGE ABSCESS;  Surgeon: Christena Flake, MD;  Location: [facility] ORS;  Service: Orthopedics;   Laterality: Right;   none      Family History  Problem Relation Age of Onset   Heart attack Mother    Stroke Mother    Hypertension Mother    Mental illness Mother        Manic Depressive Bipolar Disorder   Heart disease Mother    Other Father        unknown medical history   Hypertension Maternal Aunt    Mental illness Maternal Aunt    Hypertension Maternal Aunt    Mental illness Maternal Aunt    Hyperlipidemia Maternal Grandfather    Hypertension Maternal Grandfather    Mental illness Maternal Grandfather     Social History   Socioeconomic History   Marital status: Single    Spouse name: Not on file   Number of children: Not on file   Years of education: Not on file   Highest education level: Not on file  Occupational History   Not on file  Tobacco Use   Smoking status: Never   Smokeless tobacco: Never   Tobacco comments:    Patient is a resident at [facility] for alcohol abuse and previous drug abuse (2016). Patient was homeless prior to going to [facility].  Vaping Use   Vaping Use: Never used  Substance and Sexual Activity   Alcohol use: Not Currently    Comment: 1/2 gal of liquor daily, last use 06/27/2021   Drug use: Not Currently    Types: Amphetamines, Marijuana, Cocaine, Heroin    Comment: Patient denies recent  drug use. last use 2016   Sexual activity: Yes    Birth control/protection: None  Other Topics Concern   Not on file  Social History Narrative   Not on file   Social Determinants of Health   Financial Resource Strain: Not on file  Food Insecurity: No Food Insecurity   Worried About Programme researcher, broadcasting/film/video in the Last Year: Never true   Ran Out of Food in the Last Year: Never true  Transportation Needs: Unmet Transportation Needs   Lack of Transportation (Medical): Yes   Lack of Transportation (Non-Medical): Yes  Physical Activity: Not on file  Stress: Not on file  Social Connections: Not on file  Intimate Partner Violence: Not on file    ROS Review  of Systems  Constitutional: Negative.   HENT: Negative.    Eyes: Negative.   Respiratory: Negative.    Cardiovascular: Negative.   Gastrointestinal: Negative.   Endocrine: Positive for heat intolerance.  Genitourinary: Negative.   Musculoskeletal:  Positive for arthralgias (multiple joint pain has history of RA).  Skin: Negative.   Neurological: Negative.   Hematological: Negative.   Psychiatric/Behavioral: Negative.     Objective:   Today's Vitals: BP 121/78 (BP Location: Left Arm, Patient Position: Sitting, Cuff Size: Large)   Pulse 89   Temp (!) 97.2 F (36.2 C)   Resp 18   Ht 6' 1.5" (1.867 m)   Wt 210 lb 9.6 oz (95.5 kg)   SpO2 98%   BMI 27.41 kg/m   Physical Exam HENT:     Head: Normocephalic and atraumatic.     Nose: Nose normal.     Mouth/Throat:     Mouth: Mucous membranes are moist.  Eyes:     Extraocular Movements: Extraocular movements intact.     Conjunctiva/sclera: Conjunctivae normal.     Pupils: Pupils are equal, round, and reactive to light.  Cardiovascular:     Rate and Rhythm: Normal rate and regular rhythm.     Pulses: Normal pulses.     Heart sounds: Normal heart sounds.  Pulmonary:     Effort: Pulmonary effort is normal.     Breath sounds: Normal breath sounds.  Abdominal:     General: Abdomen is flat. Bowel sounds are normal.     Palpations: Abdomen is soft.  Genitourinary:    Comments: Deferred per patient Musculoskeletal:        General: Normal range of motion.     Cervical back: Normal range of motion.  Skin:    General: Skin is warm.  Neurological:     General: No focal deficit present.     Mental Status: He is alert and oriented to person, place, and time. Mental status is at baseline.  Psychiatric:        Mood and Affect: Mood normal.        Behavior: Behavior normal.        Thought Content: Thought content normal.        Judgment: Judgment normal.    Assessment & Plan:    1. Encounter to establish care -He was  encouraged to continue on current treatment regimen.  2. Hypothyroidism, unspecified type -His TSH was 12.989, he self discontinued his Levothyroxine, will continue to monitor his TSH, he was advised to notify clinic for worsening symptoms.  3. Rheumatoid arthritis, involving unspecified site, unspecified whether rheumatoid factor present (HCC) - Will check RA Factor, he will continue on 20 mg Prednisone daily for 7 days. He was educated on  medication side effects and advised to notify clinic. He will follow up with Dr Gavin Potters next week for his RA. - Rheumatoid Factor; Future - predniSONE (DELTASONE) 20 MG tablet; Take 1 tablet (20 mg total) by mouth once daily with breakfast for 7 days.  Dispense: 7 tablet; Refill: 0 - Rheumatoid Factor      Follow-up: Return in about 6 days (around 08/13/2021), or if symptoms worsen or fail to improve.   Latera [facility]clin Trellis Paganini, NP

## 2021-08-08 ENCOUNTER — Ambulatory Visit: Payer: Medicaid Other | Admitting: Pharmacy Technician

## 2021-08-08 ENCOUNTER — Other Ambulatory Visit: Payer: Self-pay

## 2021-08-08 DIAGNOSIS — Z79899 Other long term (current) drug therapy: Secondary | ICD-10-CM

## 2021-08-08 LAB — RHEUMATOID FACTOR: Rheumatoid fact SerPl-aCnc: 11.4 IU/mL (ref <14.0)

## 2021-08-08 NOTE — Progress Notes (Signed)
Completed [facility] application and contract.  Patient agreed to all terms of the [facility] contract.    Patient approved to receive medication assistance at [facility] until time for re-certification in 2023, and as long as eligibility criteria continues to be met.    Provided patient with community resource material based on his particular needs.    Namish Krise J. Claretha Townshend Care Manager [facility]  

## 2021-08-12 ENCOUNTER — Other Ambulatory Visit: Payer: Self-pay

## 2021-08-13 ENCOUNTER — Other Ambulatory Visit: Payer: Self-pay

## 2021-08-13 ENCOUNTER — Ambulatory Visit: Payer: Medicaid Other | Admitting: Rheumatology

## 2021-08-13 ENCOUNTER — Encounter: Payer: Self-pay | Admitting: Rheumatology

## 2021-08-13 VITALS — BP 128/82 | HR 98 | Temp 97.0°F | Resp 18 | Ht 73.5 in | Wt 209.0 lb

## 2021-08-13 DIAGNOSIS — M069 Rheumatoid arthritis, unspecified: Secondary | ICD-10-CM

## 2021-08-13 MED ORDER — PREDNISONE 10 MG PO TABS
10.0000 mg | ORAL_TABLET | Freq: Every day | ORAL | 1 refills | Status: DC
  Filled 2021-08-13: qty 30, 30d supply, fill #0
  Filled 2021-09-12: qty 30, 30d supply, fill #1

## 2021-08-13 MED ORDER — HYDROXYCHLOROQUINE SULFATE 200 MG PO TABS
200.0000 mg | ORAL_TABLET | Freq: Every day | ORAL | 1 refills | Status: DC
  Filled 2021-08-13 (×2): qty 30, 30d supply, fill #0
  Filled 2021-09-12: qty 30, 30d supply, fill #1

## 2021-08-13 NOTE — Patient Instructions (Signed)
Schedule xrays  Follow up with me September 21

## 2021-08-13 NOTE — Progress Notes (Signed)
[facility]  PROGRESS NOTE  Patient:Curtis Cobb Male   DOB:1971-10-13     50 y.o.  URK:[id number]  Visit Date: 08/13/2021  HPI:  50 year old white male.  Currently [facility] for substance abuse.  Referred for evaluation for rheumatoid arthritis.  He describes an episode in 2015.  He was working at [employer] in [place].  Biking regularly.  Going to the gym.  In good shape.  Had sudden onset of swelling in hands and knees.  Could hardly get out of the bed Was evaluated in the community clinic.  He said his rheumatoid factor was negative but he look like rheumatoid arthritis.  No prior history of psoriasis.  He was treated with prednisone with improvement and saw a rheumatologist and started methotrexate.  He had nausea and headache and did not continue.  He had no recurrence except maybe once or twice a year he would have a flare and would be seen in the emergency room and take prednisone He has repeated problems with substance abuse including alcohol and opiates and cocaine.  ER evaluations and admissions.  This spring has been emergency room 4-5 times with flare of his joints.  Each time receiving prednisone taper.  He is currently on 20 mg of prednisone.  Has morning stiffness in his hands and his feet.  Wrists have been swelling He anticipates staying at [facility] until the spring of next year.  Needs to be able to start working some No family history of rheumatoid.  No recent shortness of breath.  No dry eyes.  Recent liver functions mildly elevated transaminases.  Prior ultrasound showed fatty liver and gallbladder sludge.  Prior abdominal CT shows rib fracture and degenerative disc disease at L5-S1 He had MRSA abscess 2019 right hand and was operated with resolution     Past Medical History:  Diagnosis Date   Alcohol use disorder, moderate, dependence (HCC) 02/13/2014   Anxiety    COVID-19 03/29/2021   Diagnosed in [facility] ED on 03/29/2021   Depression     Hypertension    Rheumatoid arthritis (HCC)    Substance abuse [facility])     Past Surgical History:  Procedure Laterality Date   INCISION AND DRAINAGE ABSCESS Right 08/25/2018   Procedure: INCISION AND DRAINAGE ABSCESS;  Surgeon: Christena Flake, MD;  Location: [facility] ORS;  Service: Orthopedics;  Laterality: Right;   INCISION AND DRAINAGE ABSCESS Right 08/30/2018   Procedure: INCISION AND DRAINAGE ABSCESS;  Surgeon: Christena Flake, MD;  Location: [facility] ORS;  Service: Orthopedics;  Laterality: Right;   none      Social History   Tobacco Use   Smoking status: Never   Smokeless tobacco: Never   Tobacco comments:    Patient is a resident at [facility] for alcohol abuse and previous drug abuse (2016). Patient was homeless prior to going to [facility].  Substance Use Topics   Alcohol use: Not Currently    Comment: 1/2 gal of liquor daily, last use 06/27/2021     MEDICATIONS: Current Outpatient Medications  Medication Sig Dispense Refill   busPIRone (BUSPAR) 15 MG tablet Take 15 mg by mouth 2 (two) times daily.     hydroxychloroquine (PLAQUENIL) 200 MG tablet Take 1 tablet (200 mg total) by mouth daily. 30 tablet 1   predniSONE (DELTASONE) 20 MG tablet Take 1 tablet (20 mg total) by mouth once daily with breakfast for 7 days. 7 tablet 0   predniSONE (DELTASONE) 5 MG tablet Take 2  tablets (10 mg total) by mouth daily with breakfast. 60 tablet 1   FLUoxetine (PROZAC) 20 MG capsule Take 1 capsule (20 mg total) by mouth once daily. 30 capsule 0   No current facility-administered medications for this visit.     ALLERGIES Allergies  Allergen Reactions   Sulfa Antibiotics Other (See Comments)    Unknown reaction     PHYSICAL EXAM: Blood pressure 128/82, pulse 98, temperature (!) 97 F (36.1 C), resp. rate 18, height 6' 1.5" (1.867 m), weight 209 lb (94.8 kg), SpO2 98 %. Pleasant male.  No acute distress.  Sclera clear.  No psoriatic patches elbows or ears.  Oropharynx clear.  Clear chest.  No murmur.  No  visceromegaly.  No significant edema Musculoskeletal: Good range of motion neck and shoulders.  Elbows without synovitis or nodules.  Bilateral wrist synovitis.  Pain with flexion extension.  MCPs are mildly thickened diffusely on the right and the left.  PIPs are tender.  Left third PIP is swollen.  DIPs nontender.  Hips move well.  Small right knee effusion.  Negative Patrick maneuver.  Back flexes well.  Ankles without synovitis.  MCPs are tender with positive metatarsal squeeze Neurologic: Symmetric knee and ankle jerks.  Negative Tinel's.  5/5 power   ASSESSMENT: Seronegative rheumatoid arthritis.  Previously palindromic.  Now with wrist and hand and MTP synovitis.  Only treated with prednisone.  Trial of methotrexate with nausea and headache Elevated transaminases. Substance abuse at rehab Hypothyroid Fatty liver Prior MRSA infection of the right hand Degenerative disc disease    PLAN: Hesitant to try to restart methotrexate given transaminitis and substance abuse We will lower prednisone to 10 mg a day but not taper off temporarily Plaquenil 200 mg 1 p.o. daily See back 1 month Labs today.  Anti-CCP antibodies hepatitis B and C ,QuantiFERON gold May be candidate for biologic.    Danielle Dess. MD           08/13/2021,  9:50 AM

## 2021-08-14 ENCOUNTER — Other Ambulatory Visit: Payer: Self-pay

## 2021-08-14 LAB — SPECIMEN STATUS REPORT

## 2021-08-15 ENCOUNTER — Other Ambulatory Visit: Payer: Self-pay

## 2021-08-19 LAB — QUANTIFERON-TB GOLD PLUS
QuantiFERON Mitogen Value: 1.74 IU/mL
QuantiFERON Nil Value: 0 IU/mL
QuantiFERON TB1 Ag Value: 0 IU/mL
QuantiFERON TB2 Ag Value: 0.01 IU/mL
QuantiFERON-TB Gold Plus: NEGATIVE

## 2021-08-19 LAB — HEPATITIS C ANTIBODY: Hep C Virus Ab: <0.1 s/co ratio (ref 0.0–0.9)

## 2021-08-19 LAB — CYCLIC CITRUL PEPTIDE ANTIBODY, IGG/IGA: Cyclic Citrullin Peptide Ab: 8 units (ref 0–19)

## 2021-08-19 LAB — SPECIMEN STATUS REPORT

## 2021-08-19 LAB — HEPATITIS B SURFACE ANTIGEN: Hepatitis B Surface Ag: NEGATIVE

## 2021-08-20 ENCOUNTER — Other Ambulatory Visit: Payer: Self-pay

## 2021-09-08 ENCOUNTER — Other Ambulatory Visit: Payer: Self-pay

## 2021-09-08 MED ORDER — AMITRIPTYLINE HCL 25 MG PO TABS
ORAL_TABLET | ORAL | 1 refills | Status: DC
  Filled 2021-09-08: qty 30, 30d supply, fill #0

## 2021-09-08 MED ORDER — FLUOXETINE HCL 40 MG PO CAPS
ORAL_CAPSULE | ORAL | 1 refills | Status: DC
  Filled 2021-09-08: qty 30, 30d supply, fill #0

## 2021-09-08 MED ORDER — BUSPIRONE HCL 15 MG PO TABS
ORAL_TABLET | Freq: Two times a day (BID) | ORAL | 1 refills | Status: DC
  Filled 2021-09-08: qty 60, 30d supply, fill #0

## 2021-09-12 ENCOUNTER — Other Ambulatory Visit: Payer: Self-pay

## 2021-09-23 ENCOUNTER — Other Ambulatory Visit: Payer: Self-pay

## 2021-09-25 ENCOUNTER — Ambulatory Visit: Payer: Medicaid Other

## 2021-09-25 ENCOUNTER — Other Ambulatory Visit: Payer: Self-pay

## 2021-09-25 DIAGNOSIS — Z79899 Other long term (current) drug therapy: Secondary | ICD-10-CM

## 2021-09-25 NOTE — Progress Notes (Signed)
[facility] Visit Note  Patient: Curtis Cobb MRN: [id number] Date of Birth: 08/07/1971 PCP: Patient, No Pcp Per (Inactive)   [name] 50 y.o. male presents for a MTM visit today via telephone. Identified by name and date of birth.  There were no vitals taken for this visit.  Patient Information   Past Medical History:  Diagnosis Date   Alcohol use disorder, moderate, dependence (HCC) 02/13/2014   Anxiety    COVID-19 03/29/2021   Diagnosed in [facility] ED on 03/29/2021   Depression    Hypertension    Rheumatoid arthritis (HCC)    Substance abuse [facility])       Past Surgical History:  Procedure Laterality Date   INCISION AND DRAINAGE ABSCESS Right 08/25/2018   Procedure: INCISION AND DRAINAGE ABSCESS;  Surgeon: Christena Flake, MD;  Location: [facility] ORS;  Service: Orthopedics;  Laterality: Right;   INCISION AND DRAINAGE ABSCESS Right 08/30/2018   Procedure: INCISION AND DRAINAGE ABSCESS;  Surgeon: Christena Flake, MD;  Location: [facility] ORS;  Service: Orthopedics;  Laterality: Right;   none       Family History  Problem Relation Age of Onset   Heart attack Mother    Stroke Mother    Hypertension Mother    Mental illness Mother        Manic Depressive Bipolar Disorder   Heart disease Mother    Other Father        unknown medical history   Hypertension Maternal Aunt    Mental illness Maternal Aunt    Hypertension Maternal Aunt    Mental illness Maternal Aunt    Hyperlipidemia Maternal Grandfather    Hypertension Maternal Grandfather    Mental illness Maternal Grandfather              Social History   Substance and Sexual Activity  Alcohol Use Not Currently   Comment: 1/2 gal of liquor daily, last use 06/27/2021      Social History   Tobacco Use  Smoking Status Never  Smokeless Tobacco Never  Tobacco Comments   Patient is a resident at [facility] for alcohol abuse and previous drug abuse (2016). Patient was homeless prior to going to [facility].       Health Maintenance  Topic Date Due   COVID-19 Vaccine (1) Never done   TETANUS/TDAP  Never done   COLONOSCOPY (Pts 45-67yrs Insurance coverage will need to be confirmed)  Never done   INFLUENZA VACCINE  Never done   Hepatitis C Screening  Completed   HIV Screening  Completed   HPV VACCINES  Aged Out    Outpatient Encounter Medications as of 09/25/2021  Medication Sig   amitriptyline (ELAVIL) 25 MG tablet Take one tablet by mouth once daily at bedtime   busPIRone (BUSPAR) 15 MG tablet Take 1 tablet by mouth twice daily   FLUoxetine (PROZAC) 40 MG capsule Take one capsule by mouth every morning   hydroxychloroquine (PLAQUENIL) 200 MG tablet Take 1 tablet (200 mg total) by mouth once daily.   predniSONE (DELTASONE) 10 MG tablet Take 1 tablet (10 mg total) by mouth once daily with breakfast.   busPIRone (BUSPAR) 15 MG tablet Take 15 mg by mouth 2 (two) times daily. (Patient not taking: Reported on 09/25/2021)   FLUoxetine (PROZAC) 20 MG capsule Take 1 capsule (20 mg total) by mouth once daily.   No facility-administered encounter medications on file as of 09/25/2021.     Health Maintenance/Date Completed  Last ED visit:  9/29 at [facility] Last Visit to PCP: 8/11 Next Visit to PCP: follow up with rheumatology at [facility] Specialist Visit: 10/19 with rheumatology Dental Exam: 10 years ago Eye Exam: 2019 Prostate Exam: No Colonoscopy: No Flu Vaccine: No Pneumonia Vaccine: No COVID-19 Vaccine: No   Assessment and Plan: Adherence: Patient reports 100% adherence to medications.  Rheumatoid Arthritis: with recent hospitalization. After discharge from hospital, patient was coordinated to receive care by Dr. Gavin Potters (rheumatologist) at [facility]. Newly started on hydroxychloroquine ~ 5 weeks ago.  Health maintenance: has not been seen by dentist in over 10 years. Supplied contact information for [facility]. Patient plans to call.   RTC: 1  year    Jaynie Bream, PharmD Pharmacy Resident  09/25/2021 2:09 PM

## 2021-09-29 ENCOUNTER — Other Ambulatory Visit: Payer: Self-pay

## 2021-10-08 ENCOUNTER — Other Ambulatory Visit: Payer: Self-pay

## 2021-10-08 MED ORDER — AMITRIPTYLINE HCL 25 MG PO TABS
ORAL_TABLET | ORAL | 2 refills | Status: DC
  Filled 2021-10-08: qty 30, 30d supply, fill #0
  Filled 2021-11-10: qty 30, 30d supply, fill #1
  Filled 2021-12-09: qty 30, 30d supply, fill #2

## 2021-10-08 MED ORDER — FLUOXETINE HCL 40 MG PO CAPS
ORAL_CAPSULE | ORAL | 2 refills | Status: DC
  Filled 2021-10-08: qty 30, 30d supply, fill #0
  Filled 2021-11-10: qty 30, 30d supply, fill #1
  Filled 2021-12-09: qty 30, 30d supply, fill #2

## 2021-10-08 MED ORDER — BUSPIRONE HCL 15 MG PO TABS
ORAL_TABLET | Freq: Two times a day (BID) | ORAL | 2 refills | Status: DC
  Filled 2021-10-08: qty 60, 30d supply, fill #0
  Filled 2021-12-02: qty 60, 30d supply, fill #1

## 2021-10-09 ENCOUNTER — Other Ambulatory Visit: Payer: Self-pay

## 2021-10-10 ENCOUNTER — Other Ambulatory Visit: Payer: Self-pay

## 2021-10-15 ENCOUNTER — Other Ambulatory Visit: Payer: Self-pay

## 2021-10-15 ENCOUNTER — Ambulatory Visit: Payer: Medicaid Other | Admitting: Rheumatology

## 2021-10-15 ENCOUNTER — Encounter: Payer: Self-pay | Admitting: Rheumatology

## 2021-10-15 VITALS — BP 132/82 | HR 86 | Temp 97.6°F | Ht 73.0 in | Wt 218.3 lb

## 2021-10-15 DIAGNOSIS — M069 Rheumatoid arthritis, unspecified: Secondary | ICD-10-CM

## 2021-10-15 MED ORDER — HYDROXYCHLOROQUINE SULFATE 200 MG PO TABS
200.0000 mg | ORAL_TABLET | Freq: Every day | ORAL | 1 refills | Status: DC
  Filled 2021-10-15: qty 30, 30d supply, fill #0
  Filled 2021-11-10: qty 30, 30d supply, fill #1

## 2021-10-15 MED ORDER — PREDNISONE 5 MG PO TABS
5.0000 mg | ORAL_TABLET | Freq: Every day | ORAL | 1 refills | Status: DC
  Filled 2021-10-15: qty 30, 30d supply, fill #0
  Filled 2021-11-10: qty 30, 30d supply, fill #1

## 2021-10-15 MED ORDER — ETANERCEPT 50 MG/ML ~~LOC~~ SOSY: 50.0000 mg | PREFILLED_SYRINGE | Freq: Once | SUBCUTANEOUS | Status: DC

## 2021-10-15 NOTE — Progress Notes (Signed)
[facility]  PROGRESS NOTE  Patient:Curtis Cobb Male   DOB:1971-09-02     50 y.o.  MWU:[id number]  Visit Date: 10/15/2021  HPI:  Follow-up rheumatoid.  Seronegative.  Never right hand films.  Much better on prednisone 10 and Plaquenil. Hands have some stiffness in the morning Will be at the residential treatment until May.  Plans to work     Past Medical History:  Diagnosis Date   Alcohol use disorder, moderate, dependence (HCC) 02/13/2014   Anxiety    COVID-19 03/29/2021   Diagnosed in [facility] ED on 03/29/2021   Depression    Hypertension    Rheumatoid arthritis (HCC)    Substance abuse [facility])     Past Surgical History:  Procedure Laterality Date   INCISION AND DRAINAGE ABSCESS Right 08/25/2018   Procedure: INCISION AND DRAINAGE ABSCESS;  Surgeon: Christena Flake, MD;  Location: [facility] ORS;  Service: Orthopedics;  Laterality: Right;   INCISION AND DRAINAGE ABSCESS Right 08/30/2018   Procedure: INCISION AND DRAINAGE ABSCESS;  Surgeon: Christena Flake, MD;  Location: [facility] ORS;  Service: Orthopedics;  Laterality: Right;   none      Social History   Tobacco Use   Smoking status: Never   Smokeless tobacco: Never   Tobacco comments:    Patient is a resident at [facility] for alcohol abuse and previous drug abuse (2016). Patient was homeless prior to going to [facility].  Substance Use Topics   Alcohol use: Not Currently    Comment: 1/2 gal of liquor daily, last use 06/27/2021     MEDICATIONS: Current Outpatient Medications  Medication Sig Dispense Refill   amitriptyline (ELAVIL) 25 MG tablet TAKE ONE TABLET BY MOUTH ONCE DAILY AT BEDTIME. 30 tablet 2   busPIRone (BUSPAR) 15 MG tablet TAKE ONE TABLET BY MOUTH TWICE DAILY. 60 tablet 2   FLUoxetine (PROZAC) 40 MG capsule TAKE ONE CAPSULE BY MOUTH ONCE DAILY EVERY MORNING. 30 capsule 2   hydroxychloroquine (PLAQUENIL) 200 MG tablet Take 1 tablet (200 mg total) by mouth once daily. 30 tablet 1   predniSONE (DELTASONE)  10 MG tablet Take 1 tablet (10 mg total) by mouth once daily with breakfast. 30 tablet 1   busPIRone (BUSPAR) 15 MG tablet Take 15 mg by mouth 2 (two) times daily. (Patient not taking: Reported on 09/25/2021)     No current facility-administered medications for this visit.     ALLERGIES Allergies  Allergen Reactions   Sulfa Antibiotics Other (See Comments)    Unknown reaction Occurred as a child     PHYSICAL EXAM: Blood pressure 132/82, pulse 86, temperature 97.6 F (36.4 C), height 6\' 1"  (1.854 m), weight 218 lb 4.8 oz (99 kg), SpO2 98 %. Pleasant male.  Clear chest.  No significant edema Musculoskeletal: Good range of motion neck and shoulders.  No elbow nodules.  Right wrist synovitis decreased range of motion mild thickening MCPs.  Left wrist moves well.  PIPs nontender.  Hips move well.  No knee effusions Achilles insertion nontender   ASSESSMENT: Rheumatoid arthritis -Seronegative -Prior prednisone -Plaquenil -Prior methotrexate intolerance -Alcohol precluding methotrexate    PLAN: Lower prednisone to 5 mg Reordered hand films Patient assistance for biologic drug.  We will try Enbrel.  Would need to be injected at the home were taught self injection See back 2 months.  Taper off prednisone at that time    G. . MD           10/15/2021,  10:56 AM

## 2021-10-15 NOTE — Patient Instructions (Signed)
Lower pred to 5mg  qd Hand xrays Med management to precert enbrel

## 2021-11-10 ENCOUNTER — Other Ambulatory Visit: Payer: Self-pay

## 2021-12-02 ENCOUNTER — Other Ambulatory Visit: Payer: Self-pay

## 2021-12-03 ENCOUNTER — Ambulatory Visit
Admission: RE | Admit: 2021-12-03 | Discharge: 2021-12-03 | Disposition: A | Discharge status: Home / Self Care | Payer: Self-pay | Attending: Rheumatology | Admitting: Rheumatology

## 2021-12-03 ENCOUNTER — Ambulatory Visit
Admission: RE | Admit: 2021-12-03 | Discharge: 2021-12-03 | Disposition: A | Discharge status: Home / Self Care | Payer: Self-pay | Source: Ambulatory Visit | Attending: Rheumatology | Admitting: Rheumatology

## 2021-12-03 DIAGNOSIS — M069 Rheumatoid arthritis, unspecified: Secondary | ICD-10-CM | POA: Insufficient documentation

## 2021-12-09 ENCOUNTER — Other Ambulatory Visit: Payer: Self-pay

## 2021-12-10 ENCOUNTER — Ambulatory Visit: Payer: Medicaid Other | Admitting: Rheumatology

## 2021-12-10 ENCOUNTER — Other Ambulatory Visit: Payer: Self-pay

## 2021-12-10 ENCOUNTER — Encounter: Payer: Self-pay | Admitting: Rheumatology

## 2021-12-10 VITALS — BP 122/79 | HR 93 | Temp 98.2°F | Resp 16 | Ht 74.0 in | Wt 222.0 lb

## 2021-12-10 DIAGNOSIS — M069 Rheumatoid arthritis, unspecified: Secondary | ICD-10-CM

## 2021-12-10 MED ORDER — METHOTREXATE 2.5 MG PO TABS
15.0000 mg | ORAL_TABLET | ORAL | 1 refills | Status: DC
  Filled 2021-12-10: qty 24, 28d supply, fill #0

## 2021-12-10 MED ORDER — HYDROXYCHLOROQUINE SULFATE 200 MG PO TABS
200.0000 mg | ORAL_TABLET | Freq: Every day | ORAL | 1 refills | Status: DC
  Filled 2021-12-10: qty 30, 30d supply, fill #0

## 2021-12-10 MED ORDER — PREDNISONE 5 MG PO TABS
5.0000 mg | ORAL_TABLET | Freq: Every day | ORAL | 1 refills | Status: DC
Start: 2021-12-10 — End: 2022-11-30
  Filled 2021-12-10: qty 30, 30d supply, fill #0
  Filled 2022-01-27: qty 30, 30d supply, fill #1

## 2021-12-10 MED ORDER — FOLIC ACID 1 MG PO TABS
ORAL_TABLET | ORAL | 6 refills | Status: DC
  Filled 2021-12-10: qty 30, 30d supply, fill #0

## 2021-12-10 NOTE — Patient Instructions (Signed)
We will check blood count liver blood test today.  If normal then will start methotrexate 6 pills a week with folic acid 1 mg a day.  Same prednisone but try to lower it to 1/2 pill a day.  We will see back in 6 to 8 weeks

## 2021-12-10 NOTE — Progress Notes (Signed)
[facility]  PROGRESS NOTE  Patient:Curtis Cobb Male   DOB:1971/10/27     50 y.o.  EVO:[id number]  Visit Date: 12/10/2021  HPI:  Follow-up rheumatoid Did not get Enbrel through that medication management Down to 5 mg of prednisone.  Some fatigue.  Hands are stiff in the morning.  Knees are stiff Off alcohol.  Remains at treatment center.  Active.  1 Plaquenil a day.  He has not had his eyes checked     Past Medical History:  Diagnosis Date   Alcohol use disorder, moderate, dependence (HCC) 02/13/2014   Anxiety    COVID-19 03/29/2021   Diagnosed in [facility] ED on 03/29/2021   Depression    Hypertension    Rheumatoid arthritis (HCC)    Substance abuse [facility])     Past Surgical History:  Procedure Laterality Date   INCISION AND DRAINAGE ABSCESS Right 08/25/2018   Procedure: INCISION AND DRAINAGE ABSCESS;  Surgeon: Christena Flake, MD;  Location: [facility] ORS;  Service: Orthopedics;  Laterality: Right;   INCISION AND DRAINAGE ABSCESS Right 08/30/2018   Procedure: INCISION AND DRAINAGE ABSCESS;  Surgeon: Christena Flake, MD;  Location: [facility] ORS;  Service: Orthopedics;  Laterality: Right;   none      Social History   Tobacco Use   Smoking status: Never   Smokeless tobacco: Never   Tobacco comments:    Patient is a resident at [facility] for alcohol abuse and previous drug abuse (2016). Patient was homeless prior to going to [facility].  Substance Use Topics   Alcohol use: Not Currently    Comment: 1/2 gal of liquor daily, last use 06/27/2021     MEDICATIONS: Current Outpatient Medications  Medication Sig Dispense Refill   methotrexate (RHEUMATREX) 2.5 MG tablet Take 6 tablets (15 mg total) by mouth once a week. Caution:Chemotherapy. Protect from light. 24 tablet 1   predniSONE (DELTASONE) 5 MG tablet Take 1 tablet (5 mg total) by mouth daily with breakfast. 30 tablet 1   amitriptyline (ELAVIL) 25 MG tablet TAKE ONE TABLET BY MOUTH ONCE DAILY AT BEDTIME. 30 tablet 2    busPIRone (BUSPAR) 15 MG tablet TAKE ONE TABLET BY MOUTH TWICE DAILY. 60 tablet 2   FLUoxetine (PROZAC) 40 MG capsule TAKE ONE CAPSULE BY MOUTH ONCE DAILY EVERY MORNING. 30 capsule 2   hydroxychloroquine (PLAQUENIL) 200 MG tablet Take 1 tablet (200 mg total) by mouth once daily. 30 tablet 1   Current Facility-Administered Medications  Medication Dose Route Frequency Provider Last Rate Last Admin   etanercept (ENBREL) 50 MG/ML injection 50 mg  50 mg Subcutaneous Once Kandyce Rud., MD         ALLERGIES Allergies  Allergen Reactions   Sulfa Antibiotics Other (See Comments)    Unknown reaction Occurred as a child     PHYSICAL EXAM: There were no vitals taken for this visit. Pleasant male.  Clear chest.   Musculoskeletal: Good range of motion neck and shoulders.  Bilateral wrist synovitis.  MCP synovitis.  Reasonable grip.  To move well.  Small cool knee effusions   ASSESSMENT: Rheumatoid arthritis, still some disease activity.  On prednisone 5 mg a day and Plaquenil.  Was unable to get overall patient assist Polysubstance abuse but not under treatment Prior alcohol hepatitis.    PLAN: Recheck metabolic panel and CBC If unremarkable then will start methotrexate 6 pills a week.  Folic acid 1 mg a day Lower prednisone to 200 mg a day.  Same Plaquenil.  If he remains on Plaquenil we will have to arrange ophthalmologic evaluation See back in 6 to 8 weeks.  We will draw lab monitoring at that time    G. Al Pimple. MD           12/10/2021,  10:07 AM

## 2021-12-11 ENCOUNTER — Telehealth: Payer: Self-pay | Admitting: Emergency Medicine

## 2021-12-11 LAB — COMPREHENSIVE METABOLIC PANEL
ALT: 11 IU/L (ref 0–44)
AST: 19 IU/L (ref 0–40)
Albumin/Globulin Ratio: 1.6 (ref 1.2–2.2)
Albumin: 4.6 g/dL (ref 4.0–5.0)
Alkaline Phosphatase: 51 IU/L (ref 44–121)
BUN/Creatinine Ratio: 14 (ref 9–20)
BUN: 14 mg/dL (ref 6–24)
Bilirubin Total: 0.5 mg/dL (ref 0.0–1.2)
CO2: 20 mmol/L (ref 20–29)
Calcium: 9 mg/dL (ref 8.7–10.2)
Chloride: 102 mmol/L (ref 96–106)
Creatinine, Ser: 1.02 mg/dL (ref 0.76–1.27)
Globulin, Total: 2.8 g/dL (ref 1.5–4.5)
Glucose: 85 mg/dL (ref 70–99)
Potassium: 4.5 mmol/L (ref 3.5–5.2)
Sodium: 141 mmol/L (ref 134–144)
Total Protein: 7.4 g/dL (ref 6.0–8.5)
eGFR: 91 mL/min/{1.73_m2} (ref >59)

## 2021-12-11 LAB — CBC WITH DIFFERENTIAL/PLATELET
Basophils Absolute: 0 10*3/uL (ref 0.0–0.2)
Basos: 0 %
EOS (ABSOLUTE): 0.2 10*3/uL (ref 0.0–0.4)
Eos: 2 %
Hematocrit: 42.5 % (ref 37.5–51.0)
Hemoglobin: 14.3 g/dL (ref 13.0–17.7)
Immature Grans (Abs): 0 10*3/uL (ref 0.0–0.1)
Immature Granulocytes: 0 %
Lymphocytes Absolute: 1.8 10*3/uL (ref 0.7–3.1)
Lymphs: 21 %
MCH: 29.7 pg (ref 26.6–33.0)
MCHC: 33.6 g/dL (ref 31.5–35.7)
MCV: 88 fL (ref 79–97)
Monocytes Absolute: 0.8 10*3/uL (ref 0.1–0.9)
Monocytes: 9 %
Neutrophils Absolute: 5.8 10*3/uL (ref 1.4–7.0)
Neutrophils: 68 %
Platelets: 331 10*3/uL (ref 150–450)
RBC: 4.82 x10E6/uL (ref 4.14–5.80)
RDW: 12.4 % (ref 11.6–15.4)
WBC: 8.5 10*3/uL (ref 3.4–10.8)

## 2021-12-11 NOTE — Telephone Encounter (Signed)
Called and notified patient of normal lab results. Ok to start Methotrexate per Dr. Beverley Fiedler. Patient agreed and voiced understanding.

## 2022-01-27 ENCOUNTER — Other Ambulatory Visit: Payer: Self-pay

## 2022-01-27 MED ORDER — AMITRIPTYLINE HCL 25 MG PO TABS
ORAL_TABLET | ORAL | 2 refills | Status: DC
  Filled 2022-01-27: qty 30, 30d supply, fill #0

## 2022-01-27 MED ORDER — FLUOXETINE HCL 40 MG PO CAPS
ORAL_CAPSULE | ORAL | 2 refills | Status: DC
  Filled 2022-01-27: qty 30, 30d supply, fill #0

## 2022-01-27 MED ORDER — BUSPIRONE HCL 15 MG PO TABS
ORAL_TABLET | Freq: Two times a day (BID) | ORAL | 2 refills | Status: DC
  Filled 2022-01-27: qty 60, 30d supply, fill #0

## 2022-02-11 ENCOUNTER — Ambulatory Visit: Payer: Medicaid Other | Admitting: Gerontology

## 2022-04-30 ENCOUNTER — Other Ambulatory Visit: Payer: Self-pay

## 2022-08-17 ENCOUNTER — Other Ambulatory Visit: Payer: Self-pay

## 2022-09-02 ENCOUNTER — Other Ambulatory Visit: Payer: Self-pay

## 2022-11-24 ENCOUNTER — Encounter: Payer: Self-pay | Admitting: Emergency Medicine

## 2022-11-24 ENCOUNTER — Other Ambulatory Visit: Payer: Self-pay

## 2022-11-24 ENCOUNTER — Emergency Department: Payer: Medicaid Other

## 2022-11-24 ENCOUNTER — Inpatient Hospital Stay
Admission: EM | Admit: 2022-11-24 | Discharge: 2022-11-30 | DRG: 644 | Disposition: A | Discharge status: Home / Self Care | Payer: Medicaid Other | Attending: Internal Medicine | Admitting: Internal Medicine

## 2022-11-24 DIAGNOSIS — Z818 Family history of other mental and behavioral disorders: Secondary | ICD-10-CM

## 2022-11-24 DIAGNOSIS — T381X6A Underdosing of thyroid hormones and substitutes, initial encounter: Secondary | ICD-10-CM | POA: Diagnosis present

## 2022-11-24 DIAGNOSIS — Z9151 Personal history of suicidal behavior: Secondary | ICD-10-CM

## 2022-11-24 DIAGNOSIS — E039 Hypothyroidism, unspecified: Principal | ICD-10-CM | POA: Diagnosis present

## 2022-11-24 DIAGNOSIS — F331 Major depressive disorder, recurrent, moderate: Secondary | ICD-10-CM | POA: Diagnosis not present

## 2022-11-24 DIAGNOSIS — Z882 Allergy status to sulfonamides status: Secondary | ICD-10-CM

## 2022-11-24 DIAGNOSIS — Z7952 Long term (current) use of systemic steroids: Secondary | ICD-10-CM

## 2022-11-24 DIAGNOSIS — F419 Anxiety disorder, unspecified: Secondary | ICD-10-CM | POA: Diagnosis present

## 2022-11-24 DIAGNOSIS — R270 Ataxia, unspecified: Secondary | ICD-10-CM | POA: Diagnosis present

## 2022-11-24 DIAGNOSIS — R251 Tremor, unspecified: Secondary | ICD-10-CM | POA: Diagnosis present

## 2022-11-24 DIAGNOSIS — E860 Dehydration: Secondary | ICD-10-CM | POA: Diagnosis present

## 2022-11-24 DIAGNOSIS — E872 Acidosis, unspecified: Secondary | ICD-10-CM | POA: Diagnosis present

## 2022-11-24 DIAGNOSIS — F1021 Alcohol dependence, in remission: Secondary | ICD-10-CM | POA: Diagnosis present

## 2022-11-24 DIAGNOSIS — M1711 Unilateral primary osteoarthritis, right knee: Secondary | ICD-10-CM | POA: Diagnosis present

## 2022-11-24 DIAGNOSIS — R2689 Other abnormalities of gait and mobility: Secondary | ICD-10-CM | POA: Diagnosis present

## 2022-11-24 DIAGNOSIS — Z79899 Other long term (current) drug therapy: Secondary | ICD-10-CM

## 2022-11-24 DIAGNOSIS — I10 Essential (primary) hypertension: Secondary | ICD-10-CM | POA: Diagnosis present

## 2022-11-24 DIAGNOSIS — F1111 Opioid abuse, in remission: Secondary | ICD-10-CM | POA: Diagnosis present

## 2022-11-24 DIAGNOSIS — R000 Tachycardia, unspecified: Secondary | ICD-10-CM | POA: Diagnosis not present

## 2022-11-24 DIAGNOSIS — Z8249 Family history of ischemic heart disease and other diseases of the circulatory system: Secondary | ICD-10-CM

## 2022-11-24 DIAGNOSIS — M069 Rheumatoid arthritis, unspecified: Secondary | ICD-10-CM | POA: Diagnosis present

## 2022-11-24 DIAGNOSIS — Z8616 Personal history of COVID-19: Secondary | ICD-10-CM

## 2022-11-24 DIAGNOSIS — E876 Hypokalemia: Secondary | ICD-10-CM | POA: Diagnosis present

## 2022-11-24 DIAGNOSIS — F332 Major depressive disorder, recurrent severe without psychotic features: Secondary | ICD-10-CM | POA: Diagnosis present

## 2022-11-24 DIAGNOSIS — G43909 Migraine, unspecified, not intractable, without status migrainosus: Secondary | ICD-10-CM | POA: Diagnosis present

## 2022-11-24 DIAGNOSIS — G8191 Hemiplegia, unspecified affecting right dominant side: Secondary | ICD-10-CM | POA: Diagnosis not present

## 2022-11-24 LAB — COMPREHENSIVE METABOLIC PANEL
ALT: 16 U/L (ref 0–44)
AST: 28 U/L (ref 15–41)
Albumin: 4.5 g/dL (ref 3.5–5.0)
Alkaline Phosphatase: 59 U/L (ref 38–126)
Anion gap: 12 (ref 5–15)
BUN: 9 mg/dL (ref 6–20)
CO2: 22 mmol/L (ref 22–32)
Calcium: 9.1 mg/dL (ref 8.9–10.3)
Chloride: 102 mmol/L (ref 98–111)
Creatinine, Ser: 0.98 mg/dL (ref 0.61–1.24)
GFR, Estimated: >60 mL/min (ref >60)
Glucose, Bld: 100 mg/dL — ABNORMAL HIGH (ref 70–99)
Potassium: 3.3 mmol/L — ABNORMAL LOW (ref 3.5–5.1)
Sodium: 136 mmol/L (ref 135–145)
Total Bilirubin: 0.7 mg/dL (ref 0.3–1.2)
Total Protein: 8.5 g/dL — ABNORMAL HIGH (ref 6.5–8.1)

## 2022-11-24 LAB — RESP PANEL BY RT-PCR (FLU A&B, COVID) ARPGX2
Influenza A by PCR: NEGATIVE
Influenza B by PCR: NEGATIVE
SARS Coronavirus 2 by RT PCR: NEGATIVE

## 2022-11-24 LAB — URINALYSIS, COMPLETE (UACMP) WITH MICROSCOPIC
Bacteria, UA: NONE SEEN
Bilirubin Urine: NEGATIVE
Glucose, UA: NEGATIVE mg/dL
Hgb urine dipstick: NEGATIVE
Ketones, ur: NEGATIVE mg/dL
Leukocytes,Ua: NEGATIVE
Nitrite: NEGATIVE
Protein, ur: NEGATIVE mg/dL
Specific Gravity, Urine: 1.01 (ref 1.005–1.030)
Squamous Epithelial / HPF: NONE SEEN (ref 0–5)
pH: 7 (ref 5.0–8.0)

## 2022-11-24 LAB — CBC WITH DIFFERENTIAL/PLATELET
Abs Immature Granulocytes: 0.04 10*3/uL (ref 0.00–0.07)
Basophils Absolute: 0.1 10*3/uL (ref 0.0–0.1)
Basophils Relative: 1 %
Eosinophils Absolute: 0 10*3/uL (ref 0.0–0.5)
Eosinophils Relative: 0 %
HCT: 41.7 % (ref 39.0–52.0)
Hemoglobin: 14.1 g/dL (ref 13.0–17.0)
Immature Granulocytes: 0 %
Lymphocytes Relative: 15 %
Lymphs Abs: 1.6 10*3/uL (ref 0.7–4.0)
MCH: 29 pg (ref 26.0–34.0)
MCHC: 33.8 g/dL (ref 30.0–36.0)
MCV: 85.8 fL (ref 80.0–100.0)
Monocytes Absolute: 0.8 10*3/uL (ref 0.1–1.0)
Monocytes Relative: 8 %
Neutro Abs: 7.7 10*3/uL (ref 1.7–7.7)
Neutrophils Relative %: 76 %
Platelets: 315 10*3/uL (ref 150–400)
RBC: 4.86 MIL/uL (ref 4.22–5.81)
RDW: 13.2 % (ref 11.5–15.5)
WBC: 10.1 10*3/uL (ref 4.0–10.5)
nRBC: 0 % (ref 0.0–0.2)

## 2022-11-24 LAB — URINE DRUG SCREEN, QUALITATIVE (ARMC ONLY)
Amphetamines, Ur Screen: NOT DETECTED
Barbiturates, Ur Screen: NOT DETECTED
Benzodiazepine, Ur Scrn: NOT DETECTED
Cannabinoid 50 Ng, Ur ~~LOC~~: NOT DETECTED
Cocaine Metabolite,Ur ~~LOC~~: NOT DETECTED
MDMA (Ecstasy)Ur Screen: NOT DETECTED
Methadone Scn, Ur: NOT DETECTED
Opiate, Ur Screen: NOT DETECTED
Phencyclidine (PCP) Ur S: NOT DETECTED
Tricyclic, Ur Screen: POSITIVE — AB

## 2022-11-24 LAB — ETHANOL: Alcohol, Ethyl (B): <10 mg/dL (ref <10)

## 2022-11-24 LAB — PROCALCITONIN: Procalcitonin: <0.1 ng/mL

## 2022-11-24 LAB — PROTIME-INR
INR: 1.1 (ref 0.8–1.2)
Prothrombin Time: 14.6 seconds (ref 11.4–15.2)

## 2022-11-24 LAB — MAGNESIUM: Magnesium: 1.9 mg/dL (ref 1.7–2.4)

## 2022-11-24 LAB — TSH: TSH: 21.276 u[IU]/mL — ABNORMAL HIGH (ref 0.350–4.500)

## 2022-11-24 LAB — CK: Total CK: 91 U/L (ref 49–397)

## 2022-11-24 LAB — T4, FREE: Free T4: 0.59 ng/dL — ABNORMAL LOW (ref 0.61–1.12)

## 2022-11-24 LAB — LACTIC ACID, PLASMA
Lactic Acid, Venous: 2.4 mmol/L (ref 0.5–1.9)
Lactic Acid, Venous: 1.6 mmol/L (ref 0.5–1.9)

## 2022-11-24 LAB — PHOSPHORUS: Phosphorus: 4.5 mg/dL (ref 2.5–4.6)

## 2022-11-24 LAB — APTT: aPTT: 33 seconds (ref 24–36)

## 2022-11-24 LAB — TROPONIN I (HIGH SENSITIVITY): Troponin I (High Sensitivity): 7 ng/L (ref <18)

## 2022-11-24 MED ORDER — LORAZEPAM 2 MG/ML IJ SOLN
0.5000 mg | Freq: Once | INTRAMUSCULAR | Status: AC
  Administered 2022-11-24: 0.5 mg via INTRAVENOUS
  Filled 2022-11-24: qty 1

## 2022-11-24 MED ORDER — ACETAMINOPHEN 325 MG PO TABS
650.0000 mg | ORAL_TABLET | Freq: Four times a day (QID) | ORAL | Status: AC | PRN
  Administered 2022-11-26 – 2022-11-28 (×5): 650 mg via ORAL
  Filled 2022-11-24 (×7): qty 2

## 2022-11-24 MED ORDER — THIAMINE HCL 100 MG/ML IJ SOLN
100.0000 mg | Freq: Once | INTRAMUSCULAR | Status: AC
  Administered 2022-11-24: 100 mg via INTRAVENOUS
  Filled 2022-11-24: qty 2

## 2022-11-24 MED ORDER — ONDANSETRON HCL 4 MG PO TABS: 4.0000 mg | ORAL_TABLET | Freq: Four times a day (QID) | ORAL | Status: DC | PRN

## 2022-11-24 MED ORDER — SENNOSIDES-DOCUSATE SODIUM 8.6-50 MG PO TABS: 1.0000 | ORAL_TABLET | Freq: Every evening | ORAL | Status: DC | PRN

## 2022-11-24 MED ORDER — ZINC SULFATE 220 (50 ZN) MG PO CAPS
ORAL_CAPSULE | Freq: Every day | ORAL | Status: DC
  Administered 2022-11-26: 220 mg via ORAL
  Filled 2022-11-24 (×2): qty 1

## 2022-11-24 MED ORDER — LACTATED RINGERS IV BOLUS (SEPSIS)
1000.0000 mL | Freq: Once | INTRAVENOUS | Status: AC
  Administered 2022-11-24: 1000 mL via INTRAVENOUS

## 2022-11-24 MED ORDER — FOLIC ACID 1 MG PO TABS
1.0000 mg | ORAL_TABLET | Freq: Every day | ORAL | Status: DC
  Administered 2022-11-25 – 2022-11-30 (×6): 1 mg via ORAL
  Filled 2022-11-24 (×6): qty 1

## 2022-11-24 MED ORDER — SODIUM CHLORIDE 0.9 % IV BOLUS
1000.0000 mL | Freq: Once | INTRAVENOUS | Status: AC
  Administered 2022-11-24: 1000 mL via INTRAVENOUS

## 2022-11-24 MED ORDER — POTASSIUM CHLORIDE CRYS ER 20 MEQ PO TBCR
40.0000 meq | EXTENDED_RELEASE_TABLET | Freq: Once | ORAL | Status: AC
  Administered 2022-11-24: 40 meq via ORAL
  Filled 2022-11-24: qty 2

## 2022-11-24 MED ORDER — FLUOXETINE HCL 20 MG PO CAPS
40.0000 mg | ORAL_CAPSULE | Freq: Every day | ORAL | Status: DC
  Administered 2022-11-25 – 2022-11-29 (×5): 40 mg via ORAL
  Filled 2022-11-24 (×5): qty 2

## 2022-11-24 MED ORDER — CYANOCOBALAMIN 500 MCG PO TABS
1000.0000 ug | ORAL_TABLET | Freq: Every day | ORAL | Status: AC
  Administered 2022-11-24 – 2022-11-25 (×2): 1000 ug via ORAL
  Filled 2022-11-24 (×2): qty 2

## 2022-11-24 MED ORDER — LABETALOL HCL 5 MG/ML IV SOLN: 5.0000 mg | INTRAVENOUS | Status: DC | PRN

## 2022-11-24 MED ORDER — ACETAMINOPHEN 650 MG RE SUPP: 650.0000 mg | Freq: Four times a day (QID) | RECTAL | Status: AC | PRN

## 2022-11-24 MED ORDER — LORAZEPAM 2 MG/ML IJ SOLN
1.0000 mg | INTRAMUSCULAR | Status: DC | PRN
  Administered 2022-11-25 – 2022-11-29 (×2): 1 mg via INTRAVENOUS
  Filled 2022-11-24 (×3): qty 1

## 2022-11-24 MED ORDER — ENOXAPARIN SODIUM 40 MG/0.4ML IJ SOSY
40.0000 mg | PREFILLED_SYRINGE | INTRAMUSCULAR | Status: DC
  Administered 2022-11-24 – 2022-11-29 (×6): 40 mg via SUBCUTANEOUS
  Filled 2022-11-24 (×6): qty 0.4

## 2022-11-24 MED ORDER — LEVOMEFOLATE GLUCOSAMINE 1700 MCG PO CAPS: ORAL_CAPSULE | Freq: Every day | ORAL | Status: DC

## 2022-11-24 MED ORDER — ONDANSETRON HCL 4 MG/2ML IJ SOLN: 4.0000 mg | Freq: Four times a day (QID) | INTRAMUSCULAR | Status: DC | PRN

## 2022-11-24 NOTE — Assessment & Plan Note (Signed)
-   Labetalol 5 mg IV q4h prn for sbp > 175, 4 doses ordered

## 2022-11-24 NOTE — ED Provider Notes (Addendum)
-----------------------------------------   3:07 PM on 11/24/2022 -----------------------------------------  Blood pressure (!) 148/104, pulse 95, temperature 98.9 F (37.2 C), temperature source Oral, resp. rate 12, height 6\' 2"  (1.88 m), weight 90.7 kg, SpO2 97 %.  Assuming care from Dr. .  In short, Curtis Cobb is a 51 y.o. male with a chief complaint of Fever .  Refer to the original H&P for additional details.  The current plan of care is to follow-up labs for diffuse tremors, plan for repeat lactate following hydration.  ----------------------------------------- 6:20 PM on 11/24/2022 -----------------------------------------  Additional labs are reassuring, lactate normalized following IV fluids, procalcitonin is undetectable and troponin within normal limits.  Patient's tremors have resolved and vital signs have normalized, no evidence of infectious process at this time as chest x-ray and urinalysis are clear.  Patient reports feeling better and he is appropriate for discharge home with PCP follow-up, was provided with referral to establish care.  He was counseled to return to the ED for new or worsening symptoms, patient agrees with plan.   ----------------------------------------- 8:28 PM on 11/24/2022 ----------------------------------------- Unfortunately, when attempting to walk, patient becomes extremely tremulous and both of his legs appear to give out on him.  He is unable to walk on his own, which he states is an acute change from last night.  Would consider nutritional deficiency as the cause of his symptoms and will add on B12 level, magnesium level within normal limits.  Case discussed with hospitalist for admission, patient may benefit from neurology evaluation.     11/26/2022, MD 11/24/22 2029

## 2022-11-24 NOTE — Assessment & Plan Note (Signed)
With occasional uncontrollable tremors and inability to ambulate - Agree with ED provider to check B12 level - I ordered one-time dose of thiamine 100 mg IV once, and B12 supplementation at 1000 mcg p.o. daily, 2 doses ordered - Fall precautions - PT, OT ordered

## 2022-11-24 NOTE — Hospital Course (Addendum)
Curtis Cobb is a 51 year old male with history of alcohol use disorder, depression, anxiety, hypertension, rheumatoid arthritis, history of substance abuse, who presents to the emergency department for chief concerns of tremors.  Initial vitals in the emergency department showed temperature 98.9, respiration rate of 24, heart rate of 133, blood pressure 148/104, SpO2 of 97% on room air.  Serum sodium is 136, potassium 3.3, chloride 102, bicarb 22, BUN of 9, serum creatinine of 0.98, EGFR greater than 60, nonfasting blood glucose 100, WBC 10.1, hemoglobin 14.1, platelets of 315.  ED treatment: Lorazepam 0.5 mg x 1, potassium chloride 40 mill equivalent one-time dose, LR 1 L bolus, sodium chloride 1 L bolus  Patient was being discharged from the emergency department, upon discharge, Mr. [name] was unable to ambulate. Juice was provided patient denies further needs.  It was noted that he had visible bilateral hand tremors and inability to walk inside the emergency department.  11/29: Hemodynamically stable, labs remained stable, B12 at 764, procalcitonin negative, CK within normal limit, hypokalemia has been resolved, TSH significantly elevated at 21.276 with low free T4 at 0.59 which makes the diagnosis of hypothyroidism, starting him on Synthroid.  UDS was positive for tricyclic, patient is on tricyclic at home.  Blood culture negative in 24-hour, lactic acidosis resolved on subsequent check. Patient continued to have significant imbalance and unable to participate with PT, developed right-sided weakness-MRI brain was negative for any acute abnormality. PT and OT initially recommended CIR due to persistent imbalance, did not qualify for CIR. Patient is developing tachycardia with ambulation and remain quite dizzy. Concern of atypical migraine-given migraine cocktail. PT will reevaluate tomorrow before discharge. Also getting some IV fluid for concern of dehydration.  11/30: Patient remained  quite ataxic and tremulous.  Neurosurgery was consulted, there is a possibility of ataxia and parkinsonian features due to severe hypothyroidism. Concern of posterior column dysfunction, B12 is normal.  Neurology ordered C spinal MRI to rule out myelopathy/posterior cord compression due to dorsal column findings on exam. Also checking zinc levels, RPR and HIV.  12/1: Some improvement in lower extremity weakness but remained ataxic.  C-spine MRI with no evidence of myelopathy.  RPR and zinc levels pending.  HIV negative PT/OT are recommending home health.  12/2: RPR negative.  Zinc levels pending.  Right knee imaging was done as he was complaining of a lot of pain interfering with walking and it shows severe lateral and patellofemoral compartment osteoarthritis.  Patient need to see an orthopedic surgeon as an outpatient. Patient also need to get established with outpatient neurology to have nerve conduction studies done for further workup of his ataxia.  Unable to reach anyone at his group home, still awaiting response so we can discharge him.  12/3: Patient overnight became agitated and pacing around the room.  Per patient he feels like that he is going that black spot again, denies any suicidal thoughts or ideations but has an history of suicide twice.  Was given Ativan with no success.  Sitter was placed and psych was consulted by cross coverage. Patient seemed frustrated with his current living situation.  Further resources were provided and psych increase the dose of Prozac to 60 mg daily and also added gabapentin and Abilify. Unable to discharge to group home over the weekend, most likely can go tomorrow  12/4: Patient remained stable.  Feeling much improved today no more agitation.  Took a shower.  Would like to go back to his place. Patient is given new  prescriptions for increased dose of Prozac, gabapentin, Abilify and Synthroid.  He was also started on propranolol for tremors. Patient  continued to have significant pain in left knee with ambulation, imaging with severe osteoarthritis.  Patient also need to follow-up with his rheumatologist to restart his rheumatoid arthritis treatment. Patient need to follow-up with neurology for nerve conduction studies for his ataxia although has improved after starting Synthroid. Patient will need a repeat TSH in 3 to 4 weeks and adjustment of Synthroid dose accordingly.  Patient will continue current medications and need to have a close follow-up with primary care provider, psychiatrist, neurology and orthopedic surgery.

## 2022-11-24 NOTE — H&P (Signed)
History and Physical   [name] FAO:[id number] DOB: 01-29-1971 DOA: 11/24/2022  PCP: Curtis Cobb  Patient coming from: Home via EMS  I have personally briefly reviewed patient's old medical records in [place].  Chief Concern: Weakness, tremors, inability to ambulate  HPI: [name] is a 51 year old male with history of alcohol use disorder, depression, anxiety, hypertension, rheumatoid arthritis, history of substance abuse, who presents to the emergency department for chief concerns of tremors.  Initial vitals in the emergency department showed temperature 98.9, respiration rate of 24, heart rate of 133, blood pressure 148/104, SpO2 of 97% on room air.  Serum sodium is 136, potassium 3.3, chloride 102, bicarb 22, BUN of 9, serum creatinine of 0.98, EGFR greater than 60, nonfasting blood glucose 100, WBC 10.1, hemoglobin 14.1, platelets of 315.  ED treatment: Lorazepam 0.5 mg x 1, potassium chloride 40 mill equivalent one-time dose, LR 1 L bolus, sodium chloride 1 L bolus  Patient was being discharged from the emergency department, upon discharge, Curtis Cobb was unable to ambulate. Juice was provided patient denies further needs.  It was noted that he had visible bilateral hand tremors and inability to walk inside the emergency department.  At bedside, he is able to tell me his name, age, current calendar.  I do perceive occasional tremors.  He denies, fever, nausea, vomiting. He denies known sick contacts. He denies trauma to his person.  He denies chest pain, shortness of breath, dysuria, hematuria, diarrhea.  He states he has been sober from alcohol for about 18 months.  He denies drug use for a couple of years.  He reports he eats a lot of beans and chicken and meat.  He does eat occasional frozen broccoli.  He denies eating leafy greens.  Social history: He lives in [place] (house for people in recovery) and he has a room mate. He denies tobacco  use. He denies current use of recreational drugs. He states it is easier to say that in the past, he has never tried PCP. He used to use everything else. He is a recovering alcohol dependent. He works in Public relations account executive, Social research officer, government.   ROS: Constitutional: no weight change, no fever ENT/Mouth: no sore throat, no rhinorrhea Eyes: no eye pain, no vision changes Cardiovascular: no chest pain, no dyspnea,  no edema, no palpitations Respiratory: no cough, no sputum, no wheezing Gastrointestinal: no nausea, no vomiting, no diarrhea, no constipation Genitourinary: no urinary incontinence, no dysuria, no hematuria Musculoskeletal: no arthralgias, no myalgias Skin: no skin lesions, no pruritus, Neuro: + weakness, no loss of consciousness, no syncope Psych: no anxiety, no depression, no decrease appetite Heme/Lymph: no bruising, no bleeding  ED Course: Discussed with emergency medicine provider, patient requiring hospitalization for chief concerns of inability to ambulate upon discharge  Assessment/Plan  Principal Problem:   Imbalance Active Problems:   HTN (hypertension)   Rheumatoid arthritis (HCC)   Opioid use disorder, mild, in sustained remission (HCC)   MDD (major depressive disorder), recurrent episode, severe (HCC)   Occasional tremors   Assessment and Plan:  * Imbalance With occasional uncontrollable tremors and inability to ambulate - Agree with ED provider to check B12 level - I ordered one-time dose of thiamine 100 mg IV once, and B12 supplementation at 1000 mcg p.o. daily, 2 doses ordered - Fall precautions - PT, OT ordered  Occasional tremors - Workup in progress at this time - Low clinical suspicion for seizure as patient endorses that he  is aware he has these tremors however he was not able to control them - These tremors and team to consider propranolol 40 mg twice daily initially and/or consult neurology  MDD (major depressive disorder), recurrent episode, severe (HCC) -  Resumed home fluoxetine 40 mg daily  Rheumatoid arthritis ([place]) - Patient is currently taking his methotrexate, Enbrel - Patient to follow-up with rheumatology as appropriate  HTN (hypertension) - Labetalol 5 mg IV q4h prn for sbp > 175, 4 doses ordered  Chart reviewed.   DVT prophylaxis: enoxaparin 40 mg subcutaneous Code Status: full code Diet: heart healthy Family Communication: no Disposition Plan: pending clinical course, anticipate discharge in the a.m. should he feel better Consults called: PT, OT Admission status: Telemetry medical, observation  Past Medical History:  Diagnosis Date   Alcohol use disorder, moderate, dependence ([place]) 02/13/2014   Anxiety    COVID-19 03/29/2021   Diagnosed in [facility] ED on 03/29/2021   Depression    Hypertension    Rheumatoid arthritis ([place])    Substance abuse ([place])    Past Surgical History:  Procedure Laterality Date   INCISION AND DRAINAGE ABSCESS Right 08/25/2018   Procedure: INCISION AND DRAINAGE ABSCESS;  Surgeon: Corky Mull, MD;  Location: [facility] ORS;  Service: Orthopedics;  Laterality: Right;   INCISION AND DRAINAGE ABSCESS Right 08/30/2018   Procedure: INCISION AND DRAINAGE ABSCESS;  Surgeon: Corky Mull, MD;  Location: [facility] ORS;  Service: Orthopedics;  Laterality: Right;   none     Social History:  reports that he has never smoked. He has never used smokeless tobacco. He reports that he does not currently use alcohol. He reports that he does not currently use drugs after having used the following drugs: Amphetamines, Marijuana, Cocaine, Heroin, MDMA (Ecstacy), Codeine, Fentanyl, Benzodiazepines, "Crack" cocaine, Oxycodone, and Methamphetamines.  Allergies  Allergen Reactions   Sulfa Antibiotics Other (See Comments)    Unknown reaction Occurred as a child   Family History  Problem Relation Age of Onset   Heart attack Mother    Stroke Mother    Hypertension Mother    Mental illness Mother        Manic Depressive Bipolar  Disorder   Heart disease Mother    Other Father        unknown medical history   Hypertension Maternal Aunt    Mental illness Maternal Aunt    Hypertension Maternal Aunt    Mental illness Maternal Aunt    Hyperlipidemia Maternal Grandfather    Hypertension Maternal Grandfather    Mental illness Maternal Grandfather    Family history: Family history reviewed and not pertinent  Prior to Admission medications   Medication Sig Start Date End Date Taking? Authorizing Provider  amitriptyline (ELAVIL) 25 MG tablet TAKE 1 TABLET BY MOUTH ONCE DAILY AT BEDTIME. 01/27/22     busPIRone (BUSPAR) 15 MG tablet TAKE 1 TABLET BY MOUTH TWICE DAILY. 01/27/22     FLUoxetine (PROZAC) 40 MG capsule TAKE 1 CAPSULE BY MOUTH ONCE DAILY EVERY MORNING. 4/58/09     folic acid (FOLVITE) 1 MG tablet TAKE ONE TABLET BY MOUTH ONCE DAILY. 12/10/21     hydroxychloroquine (PLAQUENIL) 200 MG tablet Take 1 tablet (200 mg total) by mouth once daily. 12/10/21   Iloabachie, Chioma E, NP  methotrexate (RHEUMATREX) 2.5 MG tablet Take 6 tablets (15 mg total) by mouth once a week. (Caution chemotherapy. Protect from light). 12/10/21   Emmaline Kluver., MD  predniSONE (DELTASONE) 5 MG tablet Take 1  tablet (5 mg total) by mouth once daily with breakfast. 12/10/21   Emmaline Kluver., MD   Physical Exam: Vitals:   11/24/22 2100 11/24/22 2130 11/24/22 2137 11/24/22 2200  BP: 129/88 137/88  (!) 144/96  Pulse: 87 97  (!) 104  Resp:    20  Temp:   98.5 F (36.9 C)   TempSrc:   Oral   SpO2: 95% 97%  94%  Weight:      Height:       Constitutional: appears older than chronological age, NAD, calm, comfortable Eyes: PERRL, lids and conjunctivae normal ENMT: Mucous membranes are moist. Posterior pharynx clear of any exudate or lesions. Age-appropriate dentition. Hearing appropriate Neck: normal, supple, no masses, no thyromegaly Respiratory: clear to auscultation bilaterally, no wheezing, no crackles. Normal respiratory  effort. No accessory muscle use.  Cardiovascular: Regular rate and rhythm, no murmurs / rubs / gallops. No extremity edema. 2+ pedal pulses. No carotid bruits.  Abdomen: no tenderness, no masses palpated, no hepatosplenomegaly. Bowel sounds positive.  Musculoskeletal: no clubbing / cyanosis. No joint deformity upper and lower extremities. Good ROM, no contractures, no atrophy. Normal muscle tone.  Skin: no rashes, lesions, ulcers. No induration Neurologic: Sensation intact. Strength 5/5 in all 4.  Psychiatric: Normal judgment and insight. Alert and oriented x 3. Normal mood.   EKG: independently reviewed, showing sinus rhythm with rate of 92, QTc 483  Chest x-ray on Admission: I personally reviewed and I agree with radiologist reading as below.  CT HEAD WO CONTRAST (5MM)  Result Date: 11/24/2022 CLINICAL DATA:  Altered mental status EXAM: CT HEAD WITHOUT CONTRAST TECHNIQUE: Contiguous axial images were obtained from the base of the skull through the vertex without intravenous contrast. RADIATION DOSE REDUCTION: This exam was performed according to the departmental dose-optimization program which includes automated exposure control, adjustment of the mA and/or kV according to patient size and/or use of iterative reconstruction technique. COMPARISON:  CT Brain 03/28/21 FINDINGS: Brain: No evidence of acute infarction, hemorrhage, hydrocephalus, extra-axial collection or mass lesion/mass effect. Vascular: No hyperdense vessel or unexpected calcification. Skull: Normal. Negative for fracture or focal lesion. Sinuses/Orbits: No acute finding. Other: None. IMPRESSION: No acute intracranial abnormality Electronically Signed   By: Marin Roberts M.D.   On: 11/24/2022 14:41   DG Chest 2 View  Result Date: 11/24/2022 CLINICAL DATA:  Question sepsis EXAM: CHEST - 2 VIEW COMPARISON:  Chest 04/01/2018 FINDINGS: The heart size and mediastinal contours are within normal limits. Both lungs are clear. The visualized  skeletal structures are unremarkable. IMPRESSION: No active cardiopulmonary disease. Electronically Signed   By: Franchot Gallo M.D.   On: 11/24/2022 12:40    Labs on Admission: I have personally reviewed following labs  CBC: Recent Labs  Lab 11/24/22 1213  WBC 10.1  NEUTROABS 7.7  HGB 14.1  HCT 41.7  MCV 85.8  PLT 009   Basic Metabolic Panel: Recent Labs  Lab 11/24/22 1213 11/24/22 1541 11/24/22 2000  NA 136  --   --   K 3.3*  --   --   CL 102  --   --   CO2 22  --   --   GLUCOSE 100*  --   --   BUN 9  --   --   CREATININE 0.98  --   --   CALCIUM 9.1  --   --   MG  --  1.9  --   PHOS  --   --  4.5  GFR: Estimated Creatinine Clearance: 106 mL/min (by C-G formula based on SCr of 0.98 mg/dL).  Liver Function Tests: Recent Labs  Lab 11/24/22 1213  AST 28  ALT 16  ALKPHOS 59  BILITOT 0.7  PROT 8.5*  ALBUMIN 4.5   Coagulation Profile: Recent Labs  Lab 11/24/22 1213  INR 1.1   Cardiac Enzymes: Recent Labs  Lab 11/24/22 1232  CKTOTAL 91   Thyroid Function Tests: Recent Labs    11/24/22 1213  TSH 21.276*  FREET4 0.59*   Urine analysis:    Component Value Date/Time   COLORURINE YELLOW (A) 11/24/2022 1213   APPEARANCEUR CLEAR (A) 11/24/2022 1213   LABSPEC 1.010 11/24/2022 1213   PHURINE 7.0 11/24/2022 1213   GLUCOSEU NEGATIVE 11/24/2022 1213   [place] 11/24/2022 [place] 11/24/2022 1213   [place] 11/24/2022 1213   PROTEINUR NEGATIVE 11/24/2022 1213   UROBILINOGEN 0.2 03/15/2014 1928   NITRITE NEGATIVE 11/24/2022 1213   LEUKOCYTESUR NEGATIVE 11/24/2022 1213   Dr. Tobie Poet [facility]  If 7PM-7AM, please contact overnight-coverage provider If 7AM-7PM, please contact day coverage provider [email or web address]  11/24/2022, 10:56 PM

## 2022-11-24 NOTE — ED Triage Notes (Signed)
Presents via EMS from home with fever and body aches

## 2022-11-24 NOTE — ED Notes (Signed)
Per Dr. Larinda Buttery, discharge is currently on hold as patient reports he is unable to ambulate. Juice provided to patient. Denies additional needs. Tremors noted to bilateral hands. States he has not drank alcohol in 18 months. NSR on monitor at 80's. Resp even, unlabored on RA. No distress noted at this time.

## 2022-11-24 NOTE — ED Triage Notes (Signed)
Pt to er, pt states that this am he was very shaky and it was really hard for his to stand up, pt is very tremulous.  Pt states that he doesn't drink etoh, states that he has been clear for some time.  Pt states that the shakiness is new for him.

## 2022-11-24 NOTE — Assessment & Plan Note (Signed)
-   Patient is currently taking his methotrexate, Enbrel - Patient to follow-up with rheumatology as appropriate

## 2022-11-24 NOTE — Assessment & Plan Note (Addendum)
Imaging negative.  No concern of seizures. -Starting him on propranolol

## 2022-11-24 NOTE — ED Provider Notes (Addendum)
[facility] Provider Note    Event Date/Time   First MD Initiated Contact with Patient 11/24/22 1407     (approximate)   History   Fever   HPI  Curtis Cobb is a 51 y.o. male who comes in with fever and body aches.  Patient reports being clean and sober from any alcohol or drugs for the past 18 months.  He reports just waking up today and feeling very tremulous with difficulty standing up.  He states that he had some twitching of his right eye where he could not open his right eye but he reports that this is now since resolved.  He denies any obvious shortness of breath.  He was stated that he had a fever with EMS but they did not give any Tylenol or ibuprofen and temperature here is normal.  Patient reports feeling better from when he first got here but still having the tremors.   Physical Exam   Triage Vital Signs: ED Triage Vitals  Enc Vitals Group     BP 11/24/22 1154 (!) 148/104     Pulse Rate 11/24/22 1154 (!) 133     Resp 11/24/22 1154 (!) 24     Temp 11/24/22 1154 98.9 F (37.2 C)     Temp Source 11/24/22 1154 Oral     SpO2 11/24/22 1154 97 %     Weight 11/24/22 1105 222 lb 10.6 oz (101 kg)     Height 11/24/22 1105 6\' 2"  (1.88 m)     Head Circumference --      Peak Flow --      Pain Score 11/24/22 1155 0     Pain Loc --      Pain Edu? --      Excl. in GC? --     Most recent vital signs: Vitals:   11/24/22 1154  BP: (!) 148/104  Pulse: (!) 133  Resp: (!) 24  Temp: 98.9 F (37.2 C)  SpO2: 97%     General: Awake, no distress.  CV:  Good peripheral perfusion.  Resp:  Normal effort.  Abd:  No distention.  Other:  Patient is tremulous throughout.  He has equal strength in arms and legs.  Cranial nerves appear intact.  Extraocular movements are intact.   ED Results / Procedures / Treatments   Labs (all labs ordered are listed, but only abnormal results are displayed) Labs Reviewed  LACTIC ACID, PLASMA - Abnormal;  Notable for the following components:      Result Value   Lactic Acid, Venous 2.4 (*)    All other components within normal limits  COMPREHENSIVE METABOLIC PANEL - Abnormal; Notable for the following components:   Potassium 3.3 (*)    Glucose, Bld 100 (*)    Total Protein 8.5 (*)    All other components within normal limits  URINALYSIS, COMPLETE (UACMP) WITH MICROSCOPIC - Abnormal; Notable for the following components:   Color, Urine YELLOW (*)    APPearance CLEAR (*)    All other components within normal limits  CULTURE, BLOOD (ROUTINE X 2)  CULTURE, BLOOD (ROUTINE X 2)  CBC WITH DIFFERENTIAL/PLATELET  PROTIME-INR  APTT  LACTIC ACID, PLASMA     EKG  My interpretation of EKG:  Sinus rate of 92 without any ST elevation or T wave inversions QRS slightly prolonged  RADIOLOGY I have reviewed the xray personally and  interpretted and no PNA   PROCEDURES:  Critical Care performed: No  .1-3 Lead  EKG Interpretation  Performed by: Concha Se, MD Authorized by: Concha Se, MD     Interpretation: abnormal     ECG rate:  130   ECG rate assessment: tachycardic     Rhythm: sinus tachycardia     Ectopy: none     Conduction: normal      MEDICATIONS ORDERED IN ED: Medications  lactated ringers bolus 1,000 mL (has no administration in time range)     IMPRESSION / MDM / ASSESSMENT AND PLAN / ED COURSE  I reviewed the triage vital signs and the nursing notes.   Patient's presentation is most consistent with acute presentation with potential threat to life or bodily function.   Patient comes in tachycardic.  There was question of fever we will add on procalcitonin and sepsis work-up was ordered.  He denies any recent drug or alcohol use although kind of looks like somebody he was in alcohol withdrawal.  Reports some right eye twitching and difficulty opening the eye but his cranial nerves at this time appear intact will get CT head to make sure no evidence of  intracranial mass.  Patient given some fluids, Ativan.  Will test for COVID, flu.   CT head negative Chest xray negative  UA is negative for UTI.  Lactate was elevated.  CMP is reassuring CBC is norma  Patient will be handed off to oncoming team pending results and further dispo  The patient is on the cardiac monitor to evaluate for evidence of arrhythmia and/or significant heart rate changes.      FINAL CLINICAL IMPRESSION(S) / ED DIAGNOSES   Final diagnoses:  Tachycardia     Rx / DC Orders   ED Discharge Orders     None        Note:  This document was prepared using Dragon voice recognition software and may include unintentional dictation errors.   Concha Se, MD 11/24/22 1442    Concha Se, MD 11/24/22 [phone]

## 2022-11-24 NOTE — Assessment & Plan Note (Signed)
-   Resumed home fluoxetine 40 mg daily 

## 2022-11-24 NOTE — Progress Notes (Signed)
PHARMACIST - PHYSICIAN ORDER COMMUNICATION  CONCERNING: P&T Medication Policy on Herbal Medications  DESCRIPTION:  This patient's order(s) for: Levomefolate Glucosamine Caps has been noted.  This product(s) is classified as an "herbal" or natural product. Due to a lack of definitive safety studies or FDA approval, nonstandard manufacturing practices, plus the potential risk of unknown drug-drug interactions while on inpatient medications, the Pharmacy and Therapeutics Committee does not permit the use of "herbal" or natural products of this type within [facility].   ACTION TAKEN: The pharmacy department is unable to verify this order at this time.  Please reevaluate patient's clinical condition at discharge and address if the herbal or natural product(s) should be resumed at that time.  Otelia Sergeant, PharmD, [facility] 11/24/2022 10:31 PM

## 2022-11-25 ENCOUNTER — Observation Stay: Payer: Medicaid Other

## 2022-11-25 ENCOUNTER — Encounter: Payer: Self-pay | Admitting: Internal Medicine

## 2022-11-25 LAB — CBC
HCT: 37.1 % — ABNORMAL LOW (ref 39.0–52.0)
Hemoglobin: 12.4 g/dL — ABNORMAL LOW (ref 13.0–17.0)
MCH: 29.5 pg (ref 26.0–34.0)
MCHC: 33.4 g/dL (ref 30.0–36.0)
MCV: 88.3 fL (ref 80.0–100.0)
Platelets: 232 10*3/uL (ref 150–400)
RBC: 4.2 MIL/uL — ABNORMAL LOW (ref 4.22–5.81)
RDW: 13.5 % (ref 11.5–15.5)
WBC: 6.8 10*3/uL (ref 4.0–10.5)
nRBC: 0 % (ref 0.0–0.2)

## 2022-11-25 LAB — BASIC METABOLIC PANEL
Anion gap: 5 (ref 5–15)
BUN: 9 mg/dL (ref 6–20)
CO2: 23 mmol/L (ref 22–32)
Calcium: 8.5 mg/dL — ABNORMAL LOW (ref 8.9–10.3)
Chloride: 109 mmol/L (ref 98–111)
Creatinine, Ser: 0.92 mg/dL (ref 0.61–1.24)
GFR, Estimated: >60 mL/min (ref >60)
Glucose, Bld: 93 mg/dL (ref 70–99)
Potassium: 3.7 mmol/L (ref 3.5–5.1)
Sodium: 137 mmol/L (ref 135–145)

## 2022-11-25 LAB — VITAMIN B12: Vitamin B-12: 764 pg/mL (ref 180–914)

## 2022-11-25 LAB — HIV ANTIBODY (ROUTINE TESTING W REFLEX): HIV Screen 4th Generation wRfx: NONREACTIVE

## 2022-11-25 MED ORDER — KETOROLAC TROMETHAMINE 30 MG/ML IJ SOLN
30.0000 mg | Freq: Once | INTRAMUSCULAR | Status: AC
  Administered 2022-11-25: 30 mg via INTRAVENOUS
  Filled 2022-11-25: qty 1

## 2022-11-25 MED ORDER — LEVOTHYROXINE SODIUM 50 MCG PO TABS
100.0000 ug | ORAL_TABLET | Freq: Every day | ORAL | Status: DC
  Administered 2022-11-26 – 2022-11-30 (×5): 100 ug via ORAL
  Filled 2022-11-25 (×5): qty 2

## 2022-11-25 MED ORDER — DIPHENHYDRAMINE HCL 50 MG/ML IJ SOLN
25.0000 mg | Freq: Once | INTRAMUSCULAR | Status: AC
  Administered 2022-11-25: 25 mg via INTRAVENOUS
  Filled 2022-11-25: qty 1

## 2022-11-25 MED ORDER — PROCHLORPERAZINE EDISYLATE 10 MG/2ML IJ SOLN
10.0000 mg | Freq: Once | INTRAMUSCULAR | Status: AC
  Administered 2022-11-25: 10 mg via INTRAVENOUS
  Filled 2022-11-25: qty 2

## 2022-11-25 MED ORDER — LACTATED RINGERS IV SOLN: INTRAVENOUS | Status: DC

## 2022-11-25 MED ORDER — PROPRANOLOL HCL ER 60 MG PO CP24
60.0000 mg | ORAL_CAPSULE | Freq: Every day | ORAL | Status: DC
  Administered 2022-11-25 – 2022-11-30 (×5): 60 mg via ORAL
  Filled 2022-11-25 (×6): qty 1

## 2022-11-25 NOTE — Progress Notes (Signed)
Progress Note   Patient: Curtis Cobb QDI:[id number] DOB: Jul 25, 1971 DOA: 11/24/2022     0 DOS: the patient was seen and examined on 11/25/2022   Brief hospital course: [name] is a 51 year old male with history of alcohol use disorder, depression, anxiety, hypertension, rheumatoid arthritis, history of substance abuse, who presents to the emergency department for chief concerns of tremors.  Initial vitals in the emergency department showed temperature 98.9, respiration rate of 24, heart rate of 133, blood pressure 148/104, SpO2 of 97% on room air.  Serum sodium is 136, potassium 3.3, chloride 102, bicarb 22, BUN of 9, serum creatinine of 0.98, EGFR greater than 60, nonfasting blood glucose 100, WBC 10.1, hemoglobin 14.1, platelets of 315.  ED treatment: Lorazepam 0.5 mg x 1, potassium chloride 40 mill equivalent one-time dose, LR 1 L bolus, sodium chloride 1 L bolus  Patient was being discharged from the emergency department, upon discharge, [name] was unable to ambulate. Juice was provided patient denies further needs.  It was noted that he had visible bilateral hand tremors and inability to walk inside the emergency department.  11/29: Hemodynamically stable, labs remained stable, B12 at 764, procalcitonin negative, CK within normal limit, hypokalemia has been resolved, TSH significantly elevated at 21.276 with low free T4 at 0.59 which makes the diagnosis of hypothyroidism, starting him on Synthroid.  UDS was positive for tricyclic, patient is on tricyclic at home.  Blood culture negative in 24-hour, lactic acidosis resolved on subsequent check. Patient continued to have significant imbalance and unable to participate with PT, developed right-sided weakness-MRI brain was negative for any acute abnormality. PT and OT initially recommended CIR due to persistent imbalance, did not qualify for CIR. Patient is developing tachycardia with ambulation and remain quite  dizzy. Concern of atypical migraine-given migraine cocktail. PT will reevaluate tomorrow before discharge. Also getting some IV fluid for concern of dehydration.  Assessment and Plan: * Imbalance Patient continued to have imbalance and dizziness and unable to participate with PT.  They initially recommended CIR but he does not qualify. MRI brain was done to rule out any posterior stroke and it was negative for any acute abnormality.  B12 normal He was also becoming tachycardic with ambulation. Also having some headaches with photophobia with history of migraine. -Give migraine cocktail with Toradol, Compazine and Benadryl. -Give some IV fluid -PT to revisit tomorrow  Occasional tremors Imaging negative.  No concern of seizures. -Starting him on propranolol  HTN (hypertension) - Labetalol 5 mg IV q4h prn for sbp > 175, 4 doses ordered  Hypothyroidism Patient apparently stopped taking Synthroid.  Significantly elevated TSH with low free T4. -Start Synthroid at 100 mcg. -Patient need for repeat TSH with PCP in 3 to 4 weeks  Rheumatoid arthritis ([place]) - Patient is currently taking his methotrexate, Enbrel - Patient to follow-up with rheumatology as appropriate  MDD (major depressive disorder), recurrent episode, severe ([place]) - Resumed home fluoxetine 40 mg daily   Subjective: Patient was complaining of headache with some associated photophobia.  No nausea or vomiting.  Vision seems blurry.  Continued to feel dizzy and imbalance with ambulation.  Physical Exam: Vitals:   11/25/22 1322 11/25/22 1547 11/25/22 1607 11/25/22 1700  BP: (!) 137/92  (!) 112/97 131/82  Pulse: 97 89 98 88  Resp: _0 Temp:  98.5 F (36.9 C) 98.1 F (36.7 C) 98.7 F (37.1 C)  TempSrc:  Oral Oral Oral  SpO2: 97%   98%  Weight:      Height:       General.  Well-developed gentleman, in no acute distress. Pulmonary.  Lungs clear bilaterally, normal respiratory effort. CV.  Regular rate and  rhythm, no JVD, rub or murmur. Abdomen.  Soft, nontender, nondistended, BS positive. CNS.  Alert and oriented .  No focal neurologic deficit. Extremities.  No edema, no cyanosis, pulses intact and symmetrical. Psychiatry.  Judgment and insight appears normal.   Data Reviewed: Prior data reviewed  Family Communication: Discussed with patient  Disposition: Status is: Observation The patient remains OBS appropriate and will d/c before 2 midnights.  Planned Discharge Destination: Home  20 prophylaxis.  Lovenox Time spent: 50 minutes  This record has been created using Systems analyst. Errors have been sought and corrected,but may not always be located. Such creation errors do not reflect on the standard of care.   Author: Lorella Nimrod, MD 11/25/2022 5:15 PM  For on call review [email or web address].

## 2022-11-25 NOTE — ED Notes (Signed)
To [facility] with prozac and folic acid but pt already being transported to MRI; will give meds once pt back to [facility].

## 2022-11-25 NOTE — ED Notes (Signed)
Informed RN bed assigned 

## 2022-11-25 NOTE — Evaluation (Signed)
Occupational Therapy Evaluation Patient Details Name: Curtis Cobb MRN: [id number] DOB: [birth year] Today's Date: 11/25/2022   History of Present Illness 51 yo male presents to ED with c/o tremors and imbalance. Past medical history of alcohol use disorder, depression, anxiety, hypertension, schizoaffective disorder, rheumatoid arthritis, and history of substance abuse   Clinical Impression   Patient presenting with decreased independence in self care, balance, functional mobility/transfers, endurance, and safety awareness. During evaluation pt completed bed mobility with Mod I, stood from EOB with Mod A, and took one step forward/backward with Min A using RW. Pt c/o blurry vision and eye fatigue. He demonstrated difficulty with visual pursuits and saccades. Pt endorsing 9/10 headache, RN notified pt requesting pain meds. Pt with significant tremors in BUEs causing difficulty with all self care tasks. Pt will benefit from acute OT to increase overall independence in the areas of ADLs and functional mobility in order to safely discharge to next venue of care. Recommend CIR upon discharge due to pt motivation, significant change in baseline, and to facilitate return to independent PLOF.    Recommendations for follow up therapy are one component of a multi-disciplinary discharge planning process, led by the attending physician.  Recommendations may be updated based on patient status, additional functional criteria and insurance authorization.   Follow Up Recommendations  Acute inpatient rehab (3hours/day)     Assistance Recommended at Discharge Frequent or constant Supervision/Assistance  Patient can return home with the following A lot of help with bathing/dressing/bathroom;A lot of help with walking and/or transfers;Assistance with cooking/housework;Assist for transportation;Help with stairs or ramp for entrance    Functional Status Assessment  Patient has had a recent decline in their  functional status and demonstrates the ability to make significant improvements in function in a reasonable and predictable amount of time.  Equipment Recommendations  Other (comment) (defer to next venue of care)    Recommendations for Other Services       Precautions / Restrictions Precautions Precautions: Fall Restrictions Weight Bearing Restrictions: No      Mobility Bed Mobility Overal bed mobility: Modified Independent             General bed mobility comments: HOB elevated    Transfers Overall transfer level: Needs assistance Equipment used: Rolling walker (2 wheels) Transfers: Sit to/from Stand Sit to Stand: Mod assist                  Balance Overall balance assessment: Needs assistance Sitting-balance support: Feet supported Sitting balance-Leahy Scale: Good     Standing balance support: Bilateral upper extremity supported, Reliant on assistive device for balance Standing balance-Leahy Scale: Poor                             ADL either performed or assessed with clinical judgement   ADL Overall ADL's : Needs assistance/impaired Eating/Feeding: Set up;Sitting; Minimal assistance Eating/Feeding Details (indicate cue type and reason): difficulty holding onto water cup, assistance to prevent spillage                   Toilet Transfer: Moderate assistance;Rolling walker (2 wheels) Toilet Transfer Details (indicate cue type and reason): simulated with STS from EOB         Functional mobility during ADLs: Minimal assistance;Rolling walker (2 wheels) (to take 1 step forward and back at EOB, unsafe to attempt further mobility without second assist and due to incoordination)       Vision  Patient Visual Report: Blurring of vision Vision Assessment?: Yes Eye Alignment: Within Functional Limits Ocular Range of Motion: Within Functional Limits Alignment/Gaze Preference: Within Defined Limits Tracking/Visual Pursuits: Requires  cues, head turns, or add eye shifts to track;Unable to hold eye position out of midline;Decreased smoothness of vertical tracking;Decreased smoothness of horizontal tracking Saccades: Additional eye shifts occurred during testing;Additional head turns occurred during testing Depth Perception: Undershoots     Perception     Praxis      Pertinent Vitals/Pain Pain Assessment Pain Assessment: 0-10 Pain Score: 9  Pain Location: head Pain Descriptors / Indicators: Constant, Grimacing, Headache, Pounding Pain Intervention(s): Limited activity within patient's tolerance, Monitored during session, Patient requesting pain meds-RN notified     Hand Dominance Right   Extremity/Trunk Assessment Upper Extremity Assessment Upper Extremity Assessment: RUE deficits/detail;LUE deficits/detail (good grip strength bilaterally) RUE Deficits / Details: tremors at rest & with intention, thumb opposition impaired RUE Sensation: decreased light touch ("wet" feeling) RUE Coordination: decreased fine motor LUE Deficits / Details: tremors with intention, thumb opposition intact LUE Coordination: decreased fine motor   Lower Extremity Assessment Lower Extremity Assessment: Generalized weakness RLE Coordination: decreased gross motor   Cervical / Trunk Assessment Cervical / Trunk Assessment: Normal   Communication Communication Communication: No difficulties   Cognition Arousal/Alertness: Awake/alert Behavior During Therapy: WFL for tasks assessed/performed Overall Cognitive Status: Within Functional Limits for tasks assessed                                       General Comments  HR in 140s with activity, not orthostatic (RN/MD aware)    Exercises Other Exercises Other Exercises: OT provided education re: role of OT, OT POC, post acute recs, sitting up for all meals, EOB/OOB mobility with assistance, home/fall safety.     Shoulder Instructions      Home Living Family/patient  expects to be discharged to:: Group home [facility]) Living Arrangements: Non-relatives/Friends Available Help at Discharge: Friend(s);Available PRN/intermittently Type of Home: House Home Access: Stairs to enter [employer] of Steps: cannot remember if there are steps Entrance Stairs-Rails: None Home Layout: Multi-level Alternate Level Stairs-Number of Steps: 17 Alternate Level Stairs-Rails: Right           Home Equipment: None          Prior Functioning/Environment Prior Level of Function : Independent/Modified Independent               ADLs Comments: Independent, does not currently drive (license expired)        OT Problem List: Pain;Decreased coordination;Decreased knowledge of use of DME or AE;Impaired balance (sitting and/or standing);Impaired vision/perception;Decreased activity tolerance;Decreased strength;Decreased safety awareness;Decreased range of motion      OT Treatment/Interventions: Self-care/ADL training;Therapeutic exercise;Therapeutic activities;Energy conservation;DME and/or AE instruction;Patient/family education;Balance training    OT Goals(Current goals can be found in the care plan section) Acute Rehab OT Goals Patient Stated Goal: to go home OT Goal Formulation: With patient Time For Goal Achievement: 12/09/22 Potential to Achieve Goals: Good   OT Frequency: Min 3X/week    Co-evaluation              AM-PAC OT "6 Clicks" Daily Activity     Outcome Measure Help from another person eating meals?: A Little Help from another person taking care of personal grooming?: A Little Help from another person toileting, which includes using toliet, bedpan, or urinal?: A Lot Help from  another person bathing (including washing, rinsing, drying)?: A Lot Help from another person to put on and taking off regular upper body clothing?: A Little Help from another person to put on and taking off regular lower body clothing?: A Lot 6 Click  Score: 15   End of Session Equipment Utilized During Treatment: Gait belt;Rolling walker (2 wheels) Nurse Communication: Mobility status;Patient requests pain meds  Activity Tolerance: Patient limited by fatigue Patient left: in bed;with call bell/phone within reach;with bed alarm set  OT Visit Diagnosis: Unsteadiness on feet (R26.81);Muscle weakness (generalized) (M62.81);Pain;Other symptoms and signs involving the nervous system (R29.898) Pain - part of body:  (headache)                Time: 0814-4818 OT Time Calculation (min): 21 min Charges:  OT General Charges $OT Visit: 1 Visit OT Evaluation $OT Eval Moderate Complexity: 1 Mod  [facility] MS, OTR/L ascom [phone]  11/25/22, 1:34 PM

## 2022-11-25 NOTE — ED Notes (Signed)
PT working with pt. Will give pt inderal once received from pharm.

## 2022-11-25 NOTE — Progress Notes (Signed)
  Inpatient Rehab Admissions Coordinator :  Per therapy recommendations patient was screened for CIR candidacy by Ottie Glazier RN MSN. Patient does not appear to demonstrate the medical neccesity for a Hospital level Rehabilitation /CIR admit.  Under observation. I will not place a Rehab Consult. Recommend other Rehab Venues to be pursued. Please contact me with any questions.  Ottie Glazier RN MSN Admissions Coordinator [phone]

## 2022-11-25 NOTE — Progress Notes (Signed)
Physical Therapy Treatment Patient Details Name: Curtis Cobb MRN: [id number] DOB: [birth year] Today's Date: 11/25/2022   History of Present Illness Pt is a 51 yo male presents to ED with c/o tremors and imbalance. Past medical history of alcohol use disorder, depression, anxiety, hypertension, schizoaffective disorder, rheumatoid arthritis, and history of substance abuse.    PT Comments    Pt was pleasant and motivated to participate during the session and put forth good effort throughout. Pt required extra time and effort with bed mobility tasks and then min A for stability and cues for sequencing during transfer training.  Pt was only able to amb a max of around 5 feet before fatiguing and needing to return to sitting with HR increasing from the low 100's to the upper 130's, MD notified.  Pt required heavy lean on the RW during ambulation with tremulous LE's that the pt attributed to weakness.  No adverse symptoms reported by pt other than general fatigue with SpO2 and BP WNL during the session.  Pt would not be safe to return to his prior living situation at this time where he has little to no physical assistance available.  Discharge recommendation updated to SNF secondary to pt not qualifying for IR. Pt will benefit from PT services in a SNF setting upon discharge to safely address deficits listed in patient problem list for decreased caregiver assistance and eventual return to PLOF.     Recommendations for follow up therapy are one component of a multi-disciplinary discharge planning process, led by the attending physician.  Recommendations may be updated based on patient status, additional functional criteria and insurance authorization.  Follow Up Recommendations  Skilled nursing-short term rehab (<3 hours/day) Can patient physically be transported by private vehicle: Yes   Assistance Recommended at Discharge Frequent or constant Supervision/Assistance  Patient can return home  with the following A lot of help with walking and/or transfers;A lot of help with bathing/dressing/bathroom;Assistance with cooking/housework;Assist for transportation;Help with stairs or ramp for entrance   Equipment Recommendations  Rolling walker (2 wheels)    Recommendations for Other Services       Precautions / Restrictions Precautions Precautions: Fall Restrictions Weight Bearing Restrictions: No     Mobility  Bed Mobility Overal bed mobility: Modified Independent             General bed mobility comments: Min extra time and effort during sup to/from sit but no physical assist needed    Transfers Overall transfer level: Needs assistance Equipment used: Rolling walker (2 wheels) Transfers: Sit to/from Stand Sit to Stand: Min assist           General transfer comment: Min verbal cues for increased trunk flexion and hand placement with min A for stability upon initial stand    Ambulation/Gait Ambulation/Gait assistance: Min guard Gait Distance (Feet): 5 Feet Assistive device: Rolling walker (2 wheels) Gait Pattern/deviations: Step-through pattern, Decreased step length - right, Decreased step length - left, Trunk flexed Gait velocity: decreased     General Gait Details: Pt able to amb a max of 5 feet with slow, effortful cadence and mod lean on the RW for support; tremulous BLEs that pt attributed to weakness   Stairs             Wheelchair Mobility    Modified Rankin (Stroke Patients Only)       Balance Overall balance assessment: Needs assistance Sitting-balance support: Feet supported Sitting balance-Leahy Scale: Good     Standing balance support: Bilateral upper  extremity supported, Reliant on assistive device for balance, During functional activity Standing balance-Leahy Scale: Poor                              Cognition Arousal/Alertness: Awake/alert Behavior During Therapy: WFL for tasks assessed/performed Overall  Cognitive Status: Within Functional Limits for tasks assessed                                          Exercises Total Joint Exercises Ankle Circles/Pumps: Strengthening, Both, 10 reps, AROM, 15 reps Quad Sets: Strengthening, Both, 10 reps Gluteal Sets: Strengthening, Both, 10 reps Heel Slides: Strengthening, Both, 10 reps Hip ABduction/ADduction: Strengthening, Both, 10 reps (Sidelying for RLE, isometric against bed rail for LLE secondary to inability to lie on R side) Straight Leg Raises: Strengthening, Both, 10 reps Long Arc Quad: Strengthening, Both, 10 reps Marching in Standing: Strengthening, Both, 5 reps, Standing Bridges: Strengthening, Both, 10 reps Other Exercises Other Exercises: HEP education for BLE APs, QS, GS, and LAQs x 10 every 1-2 hours; bridges, SLR's, and hip abd/add x 10 each 2x/day Lateral scooting at EOB x 3 scoots    General Comments General comments (skin integrity, edema, etc.): HR in 140s with activity, not orthostatic (RN/MD aware)      Pertinent Vitals/Pain Pain Assessment Pain Assessment: No/denies pain    Home Living Family/patient expects to be discharged to:: Group home [facility]) Living Arrangements: Non-relatives/Friends Available Help at Discharge: Friend(s);Available PRN/intermittently Type of Home: House Home Access: Stairs to enter Entrance Stairs-Rails: None Entrance Stairs-Number of Steps: cannot remember if there are steps Alternate Level Stairs-Number of Steps: 17 Home Layout: Multi-level Home Equipment: None      Prior Function            PT Goals (current goals can now be found in the care plan section) Progress towards PT goals: Progressing toward goals    Frequency           PT Plan Discharge plan needs to be updated    Co-evaluation              AM-PAC PT "6 Clicks" Mobility   Outcome Measure  Help needed turning from your back to your side while in a flat bed without using  bedrails?: None Help needed moving from lying on your back to sitting on the side of a flat bed without using bedrails?: None Help needed moving to and from a bed to a chair (including a wheelchair)?: A Little Help needed standing up from a chair using your arms (e.g., wheelchair or bedside chair)?: A Little Help needed to walk in hospital room?: A Lot Help needed climbing 3-5 steps with a railing? : Total 6 Click Score: 17    End of Session Equipment Utilized During Treatment: Gait belt Activity Tolerance: Patient limited by fatigue Patient left: in bed;with nursing/sitter in room;Other (comment) (Pt in ED hallway next to nursing station) Nurse Communication: Mobility status PT Visit Diagnosis: Unsteadiness on feet (R26.81);Other abnormalities of gait and mobility (R26.89);Muscle weakness (generalized) (M62.81);Other symptoms and signs involving the nervous system (R29.898);Difficulty in walking, not elsewhere classified (R26.2)     Time: 8563-1497 PT Time Calculation (min) (ACUTE ONLY): 24 min  Charges:  $Therapeutic Exercise: 23-37 mins  Ovidio Hanger PT, DPT 11/25/22, 3:45 PM

## 2022-11-25 NOTE — Plan of Care (Signed)
  Problem: Education: Goal: Knowledge of General Education information will improve Description: Including pain rating scale, medication(s)/side effects and non-pharmacologic comfort measures Outcome: Progressing   Problem: Clinical Measurements: Goal: Ability to maintain clinical measurements within normal limits will improve Outcome: Progressing Goal: Cardiovascular complication will be avoided Outcome: Progressing   Problem: Activity: Goal: Risk for activity intolerance will decrease Outcome: Progressing   Problem: Elimination: Goal: Will not experience complications related to urinary retention Outcome: Progressing   Problem: Pain Managment: Goal: General experience of comfort will improve Outcome: Progressing   Problem: Safety: Goal: Ability to remain free from injury will improve Outcome: Progressing   Problem: Skin Integrity: Goal: Risk for impaired skin integrity will decrease Outcome: Progressing

## 2022-11-25 NOTE — Evaluation (Signed)
Physical Therapy Evaluation Patient Details Name: Curtis Cobb MRN: [id number] DOB: 18-Dec-1971 Today's Date: 11/25/2022  History of Present Illness  51 yo male presents to ED with c/o tremors and imbalance. Past medical history of alcohol use disorder, depression, anxiety, hypertension, schizoaffective disorder, rheumatoid arthritis, and history of substance abuse  Clinical Impression  Pt found supine in bed upon PT entry with c/o 9/10 headache pain. Pt Mod I for bed mobility with HOB elevated. Sit<>Stand with min assist x1 and RW. Pt took two steps forward and back with RW and min assist x2 for safety and RW management. Pt presented with decreased R UE sensation and "wet" feeling upon palpation, decreased (3/5) strength in R LE, and decreased coordination in R UE and LE. Pt also reported a "tingling and numb" sensation traveling up bilateral LE while in standing position. Pt HR increased to 120s-130s during mobility and was negative for orthostatic hypotension. Pt would benefit from skilled physical therapy to address the listed deficits (see below) to increase independence with ADLs and function. Current recommendation is inpatient rehab due to pt motivation and significant change in baseline.          Recommendations for follow up therapy are one component of a multi-disciplinary discharge planning process, led by the attending physician.  Recommendations may be updated based on patient status, additional functional criteria and insurance authorization.  Follow Up Recommendations Acute inpatient rehab (3hours/day)      Assistance Recommended at Discharge    Patient can return home with the following  A lot of help with walking and/or transfers;A lot of help with bathing/dressing/bathroom;Assistance with cooking/housework;Assist for transportation;Help with stairs or ramp for entrance    Equipment Recommendations Rolling walker (2 wheels)  Recommendations for Other Services        Functional Status Assessment Patient has had a recent decline in their functional status and demonstrates the ability to make significant improvements in function in a reasonable and predictable amount of time.     Precautions / Restrictions Precautions Precautions: Fall Restrictions Weight Bearing Restrictions: No      Mobility  Bed Mobility Overal bed mobility: Modified Independent             General bed mobility comments: HOB elevated    Transfers Overall transfer level: Needs assistance Equipment used: Rolling walker (2 wheels) Transfers: Sit to/from Stand Sit to Stand: Min assist                Ambulation/Gait                  Stairs            Wheelchair Mobility    Modified Rankin (Stroke Patients Only)       Balance Overall balance assessment: Needs assistance Sitting-balance support: Feet supported Sitting balance-Leahy Scale: Good     Standing balance support: Bilateral upper extremity supported, Reliant on assistive device for balance Standing balance-Leahy Scale: Poor                               Pertinent Vitals/Pain Pain Assessment Pain Assessment: 0-10 Pain Score: 9  Pain Location: head Pain Descriptors / Indicators: Constant, Grimacing, Headache, Pounding Pain Intervention(s): Limited activity within patient's tolerance, Monitored during session, Patient requesting pain meds-RN notified    Home Living Family/patient expects to be discharged to:: Group home Living Arrangements: Non-relatives/Friends  Prior Function Prior Level of Function : Independent/Modified Independent               ADLs Comments: does not drive     Hand Dominance   Dominant Hand: Right    Extremity/Trunk Assessment   Upper Extremity Assessment Upper Extremity Assessment: RUE deficits/detail RUE Deficits / Details: decreased sensation RUE Sensation: decreased light touch ("wet"  feeling) RUE Coordination: decreased gross motor    Lower Extremity Assessment Lower Extremity Assessment: Generalized weakness;RLE deficits/detail RLE Coordination: decreased gross motor    Cervical / Trunk Assessment Cervical / Trunk Assessment: Normal  Communication   Communication: No difficulties  Cognition Arousal/Alertness: Awake/alert Behavior During Therapy: WFL for tasks assessed/performed Overall Cognitive Status: Within Functional Limits for tasks assessed                                          General Comments      Exercises Other Exercises Other Exercises: two steps forward and backwards with RW and x2 min assist for safety and RW management   Assessment/Plan    PT Assessment Patient needs continued PT services  PT Problem List Decreased strength;Decreased balance;Pain;Decreased range of motion;Decreased mobility;Decreased knowledge of use of DME;Decreased activity tolerance;Decreased coordination;Decreased safety awareness       PT Treatment Interventions DME instruction;Functional mobility training;Balance training;Patient/family education;Gait training;Therapeutic activities;Neuromuscular re-education;Stair training;Therapeutic exercise    PT Goals (Current goals can be found in the Care Plan section)  Acute Rehab PT Goals Patient Stated Goal: to return home PT Goal Formulation: With patient Time For Goal Achievement: 12/09/22 Potential to Achieve Goals: Good    Frequency Min 2X/week     Co-evaluation               AM-PAC PT "6 Clicks" Mobility  Outcome Measure Help needed turning from your back to your side while in a flat bed without using bedrails?: None Help needed moving from lying on your back to sitting on the side of a flat bed without using bedrails?: None Help needed moving to and from a bed to a chair (including a wheelchair)?: A Little Help needed standing up from a chair using your arms (e.g., wheelchair or  bedside chair)?: A Little Help needed to walk in hospital room?: A Lot Help needed climbing 3-5 steps with a railing? : Total 6 Click Score: 17    End of Session Equipment Utilized During Treatment: Gait belt Activity Tolerance: Patient limited by fatigue Patient left: in bed;with call bell/phone within reach Nurse Communication: Mobility status;Patient requests pain meds PT Visit Diagnosis: Unsteadiness on feet (R26.81);Other abnormalities of gait and mobility (R26.89);Muscle weakness (generalized) (M62.81);Other symptoms and signs involving the nervous system (R29.898);Difficulty in walking, not elsewhere classified (R26.2)    Time: 3536-1443 PT Time Calculation (min) (ACUTE ONLY): 22 min   Charges:             Tresa Endo O'Daniel, SPT  11/25/2022, 10:26 AM

## 2022-11-25 NOTE — ED Notes (Signed)
Pt ambulates to restroom with steady gait using walker.

## 2022-11-25 NOTE — Assessment & Plan Note (Addendum)
Patient apparently stopped taking Synthroid.  Significantly elevated TSH with low free T4. -Start Synthroid at 100 mcg. -Patient need for repeat TSH with PCP in 3 to 4 weeks 

## 2022-11-26 ENCOUNTER — Observation Stay: Payer: Medicaid Other

## 2022-11-26 DIAGNOSIS — R270 Ataxia, unspecified: Secondary | ICD-10-CM

## 2022-11-26 LAB — BASIC METABOLIC PANEL
Anion gap: 6 (ref 5–15)
BUN: 10 mg/dL (ref 6–20)
CO2: 27 mmol/L (ref 22–32)
Calcium: 8.5 mg/dL — ABNORMAL LOW (ref 8.9–10.3)
Chloride: 107 mmol/L (ref 98–111)
Creatinine, Ser: 0.96 mg/dL (ref 0.61–1.24)
GFR, Estimated: >60 mL/min (ref >60)
Glucose, Bld: 83 mg/dL (ref 70–99)
Potassium: 4.1 mmol/L (ref 3.5–5.1)
Sodium: 140 mmol/L (ref 135–145)

## 2022-11-26 LAB — CBC
HCT: 37.9 % — ABNORMAL LOW (ref 39.0–52.0)
Hemoglobin: 12.7 g/dL — ABNORMAL LOW (ref 13.0–17.0)
MCH: 29.8 pg (ref 26.0–34.0)
MCHC: 33.5 g/dL (ref 30.0–36.0)
MCV: 89 fL (ref 80.0–100.0)
Platelets: 255 10*3/uL (ref 150–400)
RBC: 4.26 MIL/uL (ref 4.22–5.81)
RDW: 13.5 % (ref 11.5–15.5)
WBC: 7.2 10*3/uL (ref 4.0–10.5)
nRBC: 0 % (ref 0.0–0.2)

## 2022-11-26 LAB — HIV ANTIBODY (ROUTINE TESTING W REFLEX): HIV Screen 4th Generation wRfx: NONREACTIVE

## 2022-11-26 MED ORDER — GADOBUTROL 1 MMOL/ML IV SOLN
9.0000 mL | Freq: Once | INTRAVENOUS | Status: AC | PRN
  Administered 2022-11-26: 9 mL via INTRAVENOUS

## 2022-11-26 NOTE — Progress Notes (Signed)
Physical Therapy Treatment Patient Details Name: Curtis Cobb MRN: [id number] DOB: 07/26/1971 Today's Date: 11/26/2022   History of Present Illness Pt is a 51 yo male presents to ED with c/o tremors and imbalance. Past medical history of alcohol use disorder, depression, anxiety, hypertension, schizoaffective disorder, rheumatoid arthritis, and history of substance abuse.    PT Comments    Pt in bed napping on entry, easily made awake and agreeable to session, remains calm and interactive despite obvious anxiety while up and AMB due to increased concern for falling. Author provides very close minGuard assist to instil confidence in the patient that he can safely attempt gait practice. Pt partakes in guarded efforts in stance, poor confidence but agreeable for hand-free efforts. Movements are slow in the setting on falls anxiety, but gross motor movements are generally jerky with dysmetric foot placement. Righting responses in trunk and arms are also jerky and ataxic. Neurology arrives to room and is able to see overground AMB before end of session. Pt is limited to additional gait due to more sensitive bilat knee pain while up, needs a recovery break. He is distressed and frustrated by his inability to control his mobility right now.     Recommendations for follow up therapy are one component of a multi-disciplinary discharge planning process, led by the attending physician.  Recommendations may be updated based on patient status, additional functional criteria and insurance authorization.  Follow Up Recommendations  Skilled nursing-short term rehab (<3 hours/day) Can patient physically be transported by private vehicle: Yes   Assistance Recommended at Discharge Intermittent Supervision/Assistance  Patient can return home with the following A lot of help with walking and/or transfers;A lot of help with bathing/dressing/bathroom;Assistance with cooking/housework;Assist for  transportation;Help with stairs or ramp for entrance   Equipment Recommendations  Rolling walker (2 wheels)    Recommendations for Other Services       Precautions / Restrictions Precautions Precautions: Fall Restrictions Weight Bearing Restrictions: No     Mobility  Bed Mobility Overal bed mobility: Modified Independent                  Transfers Overall transfer level: Needs assistance Equipment used: None Transfers: Sit to/from Stand Sit to Stand: Min guard           General transfer comment: heavy effort from standard bed height (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), bu televated can perform 8x minA    Ambulation/Gait Ambulation/Gait assistance: Min guard Gait Distance (Feet): 60 Feet Assistive device: None (author would not allow any device usage) Gait Pattern/deviations: Staggering right, Staggering left, Antalgic, Ataxic, Scissoring, Wide base of support Gait velocity: has no conitnuous AMB without devce, not appropriate for assessment at this time   Pre-gait activities: foward backward stepping at bed side 15x laterality ad lib General Gait Details: ataxic step strategy with dysmetric foot placement and apprehensive pausing 2/2 falls anxiety; trunk righting and reactive arm movements all jerky without smooth contoured movements   Stairs             Wheelchair Mobility    Modified Rankin (Stroke Patients Only)       Balance Overall balance assessment: Needs assistance             Standing balance comment: static activity more steady than gait based activity due to ataxia/dysmetria                            Cognition Arousal/Alertness: Awake/alert  Behavior During Therapy: WFL for tasks assessed/performed Overall Cognitive Status: Within Functional Limits for tasks assessed                                          Exercises      General Comments        Pertinent Vitals/Pain Pain Assessment Pain  Assessment: 0-10 Pain Score: 1  Pain Location: typical low back pain Pain Descriptors / Indicators: Aching Pain Intervention(s): Monitored during session, Premedicated before session, Repositioned    Home Living                          Prior Function            PT Goals (current goals can now be found in the care plan section) Acute Rehab PT Goals Patient Stated Goal: to return home PT Goal Formulation: With patient Time For Goal Achievement: 12/09/22 Potential to Achieve Goals: Poor Progress towards PT goals: Progressing toward goals    Frequency    7X/week (suspect neurological etiology, but remains unclear- will keep QD)      PT Plan Discharge plan needs to be updated;Current plan remains appropriate    Co-evaluation              AM-PAC PT "6 Clicks" Mobility   Outcome Measure  Help needed turning from your back to your side while in a flat bed without using bedrails?: None Help needed moving from lying on your back to sitting on the side of a flat bed without using bedrails?: None Help needed moving to and from a bed to a chair (including a wheelchair)?: A Little Help needed standing up from a chair using your arms (e.g., wheelchair or bedside chair)?: A Little Help needed to walk in hospital room?: A Lot Help needed climbing 3-5 steps with a railing? : A Lot 6 Click Score: 18    End of Session Equipment Utilized During Treatment: Gait belt Activity Tolerance: Patient limited by fatigue Patient left: in bed;Other (comment) (neurology in room) Nurse Communication: Mobility status PT Visit Diagnosis: Unsteadiness on feet (R26.81);Other abnormalities of gait and mobility (R26.89);Muscle weakness (generalized) (M62.81);Other symptoms and signs involving the nervous system (R29.898);Difficulty in walking, not elsewhere classified (R26.2)     Time: 1025-8527 PT Time Calculation (min) (ACUTE ONLY): 23 min  Charges:  $Neuromuscular Re-education:  23-37 mins                     12:11 PM, 11/26/22 Rosamaria Lints, PT, DPT Physical Therapist - [facility]  [phone] (ASCOM)    Lorelai Huyser C 11/26/2022, 12:01 PM

## 2022-11-26 NOTE — Assessment & Plan Note (Addendum)
Patient continued to improve. PT-they initially recommended CIR but he does not qualify, no recommending home health. MRI brain was done to rule out any posterior stroke and it was negative for any acute abnormality.  B12 normal Neurology was consulted. Severe hypothyroidism can also result in ataxia and possible parkinsonian features like gait instability. C-spine MRI was negative for myelopathy, HIV negative, RPR negative pending zinc

## 2022-11-26 NOTE — Progress Notes (Addendum)
Progress Note   Patient: Curtis Cobb WVP:[id number] DOB: [birth year] DOA: 11/24/2022     0 DOS: the patient was seen and examined on 11/26/2022   Brief hospital course: [name] is a 51 year old male with history of alcohol use disorder, depression, anxiety, hypertension, rheumatoid arthritis, history of substance abuse, who presents to the emergency department for chief concerns of tremors.  Initial vitals in the emergency department showed temperature 98.9, respiration rate of 24, heart rate of 133, blood pressure 148/104, SpO2 of 97% on room air.  Serum sodium is 136, potassium 3.3, chloride 102, bicarb 22, BUN of 9, serum creatinine of 0.98, EGFR greater than 60, nonfasting blood glucose 100, WBC 10.1, hemoglobin 14.1, platelets of 315.  ED treatment: Lorazepam 0.5 mg x 1, potassium chloride 40 mill equivalent one-time dose, LR 1 L bolus, sodium chloride 1 L bolus  Patient was being discharged from the emergency department, upon discharge, Curtis Cobb was unable to ambulate. Juice was provided patient denies further needs.  It was noted that he had visible bilateral hand tremors and inability to walk inside the emergency department.  11/29: Hemodynamically stable, labs remained stable, B12 at 764, procalcitonin negative, CK within normal limit, hypokalemia has been resolved, TSH significantly elevated at 21.276 with low free T4 at 0.59 which makes the diagnosis of hypothyroidism, starting him on Synthroid.  UDS was positive for tricyclic, patient is on tricyclic at home.  Blood culture negative in 24-hour, lactic acidosis resolved on subsequent check. Patient continued to have significant imbalance and unable to participate with PT, developed right-sided weakness-MRI brain was negative for any acute abnormality. PT and OT initially recommended CIR due to persistent imbalance, did not qualify for CIR. Patient is developing tachycardia with ambulation and remain quite  dizzy. Concern of atypical migraine-given migraine cocktail. PT will reevaluate tomorrow before discharge. Also getting some IV fluid for concern of dehydration.  11/30: Patient remained quite ataxic and tremulous.  Neurosurgery was consulted, there is a possibility of ataxia and parkinsonian features due to severe hypothyroidism. Concern of posterior column dysfunction, B12 is normal.  Neurology ordered C spinal MRI to rule out myelopathy/posterior cord compression due to dorsal column findings on exam. Also checking zinc levels, RPR and HIV.  Assessment and Plan: * Imbalance Patient continued to have significant ataxia. PT-they initially recommended CIR but he does not qualify. MRI brain was done to rule out any posterior stroke and it was negative for any acute abnormality.  B12 normal Neurology was consulted-they ordered zinc levels, RPR, HIV and MRI of C-spine. To rule out myelopathy and posterior cord compression. Severe hypothyroidism can also result in ataxia and possible parkinsonian features like gait instability.  Occasional tremors Imaging negative.  No concern of seizures. -Starting him on propranolol  HTN (hypertension) - Labetalol 5 mg IV q4h prn for sbp > 175, 4 doses ordered  Hypothyroidism Patient apparently stopped taking Synthroid.  Significantly elevated TSH with low free T4. -Start Synthroid at 100 mcg. -Patient need for repeat TSH with PCP in 3 to 4 weeks  Rheumatoid arthritis ([place]) - Patient was given methotrexate and Enbrel but not taking it  - Patient to follow-up with rheumatology as appropriate  MDD (major depressive disorder), recurrent episode, severe ([place]) - Resumed home fluoxetine 40 mg daily   Subjective: Patient continued to have significant ataxia and tremulousness.  Headache has been resolved.  Physical Exam: Vitals:   11/25/22 1607 11/25/22 1700 11/25/22 2308 11/26/22 0741  BP: (!) 112/97 131/82 98/62  124/89  Pulse: 98 88 73 74  Resp:  _0 Temp: 98.1 F (36.7 C) 98.7 F (37.1 C) 98.1 F (36.7 C) 98.4 F (36.9 C)  TempSrc: Oral Oral    SpO2:  98% 99% 100%  Weight:      Height:       General.  Well-developed gentleman, in no acute distress. Pulmonary.  Lungs clear bilaterally, normal respiratory effort. CV.  Regular rate and rhythm, no JVD, rub or murmur. Abdomen.  Soft, nontender, nondistended, BS positive. CNS.  Alert and oriented .  Ataxia and tremulousness Extremities.  No edema, no cyanosis, pulses intact and symmetrical. Psychiatry.  Judgment and insight appears normal.   Data Reviewed: Prior data reviewed  Family Communication: Discussed with patient  Disposition: Status is: Observation The patient remains OBS appropriate and will d/c before 2 midnights.  Planned Discharge Destination: Home  20 prophylaxis.  Lovenox Time spent: 50 minutes  This record has been created using Systems analyst. Errors have been sought and corrected,but may not always be located. Such creation errors do not reflect on the standard of care.   Author: Lorella Nimrod, MD 11/26/2022 3:52 PM  For on call review [email or web address].

## 2022-11-26 NOTE — Plan of Care (Signed)

## 2022-11-26 NOTE — TOC Initial Note (Addendum)
Transition of Care [facility]) - Initial/Assessment Note    Patient Details  Name: Curtis Cobb MRN: [id number] Date of Birth: 06-Mar-1971  [facility] CM/SW Contact:    Truddie Hidden, RN Phone Number: 11/26/2022, 3:08 PM  Clinical Narrative:                 Spoke with patient regarding therapy recommendation for SNF. Patient confirmed he does not have insurance, or a PCP. He has applied for Medicaid but stated it had been a while. RNCM will refer to financial counselor to inquire about assistance for medicaid application.  Patient was advised HH may be available via charity. He was informed charity HH acceptance is at the discretion the charity agency. Will provide [employer] Application.           Patient Goals and CMS Choice        Expected Discharge Plan and Services                                                Prior Living Arrangements/Services                       Activities of Daily Living Home Assistive Devices/Equipment: None ADL Screening (condition at time of admission) Patient's cognitive ability adequate to safely complete daily activities?: Yes Is the patient deaf or have difficulty hearing?: No Does the patient have difficulty seeing, even when wearing glasses/contacts?: No Does the patient have difficulty concentrating, remembering, or making decisions?: No Patient able to express need for assistance with ADLs?: Yes Does the patient have difficulty dressing or bathing?: No Independently performs ADLs?: No Communication: Independent Dressing (OT): Independent Grooming: Independent Feeding: Independent Bathing: Independent Toileting: Needs assistance Is this a change from baseline?: Change from baseline, expected to last <3 days In/Out Bed: Needs assistance Is this a change from baseline?: Change from baseline, expected to last <3 days Walks in Home: Needs assistance Is this a change from baseline?: Change from  baseline, expected to last <3 days Does the patient have difficulty walking or climbing stairs?: Yes Weakness of Legs: Both Weakness of Arms/Hands: Both  Permission Sought/Granted                  Emotional Assessment              Admission diagnosis:  Ataxia [R27.0] Tremor [R25.1] Tachycardia [R00.0] Imbalance [R26.89] Patient Active Problem List   Diagnosis Date Noted   Imbalance 11/24/2022   Occasional tremors 11/24/2022   Encounter to establish care 08/07/2021   Alcohol withdrawal (HCC) 06/25/2021   Hypothyroidism 10/17/2018   Tobacco use disorder 10/09/2018   Cellulitis 08/23/2018   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   Sedative, hypnotic or anxiolytic use disorder, severe, 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   MDD (major depressive disorder), recurrent episode, severe (HCC) 11/17/2015   Opioid use disorder, mild, in sustained remission (HCC) 10/08/2015   Alcohol use disorder, severe, dependence (HCC) 10/07/2015   Cannabis use disorder, moderate, dependence (HCC) 10/07/2015   HTN (hypertension) 10/07/2015   Rheumatoid arthritis (HCC) 10/07/2015   PCP:  Patient, No Pcp Per Pharmacy:   [facility] [address] [place] [address] Phone: [phone] Fax: [phone]  [facility] - [place] - [address]  [place] [address] Phone: [phone] Fax: [phone]  [facility] [address] [place] - [address] [place]) [place] [address] Phone: [phone] Fax: [phone]  [facility] - [facility] [address] [place] [address] Phone: [phone] Fax: [phone]     Social Determinants of Health (SDOH) Interventions    Readmission Risk Interventions     No data to display

## 2022-11-26 NOTE — Assessment & Plan Note (Signed)
Patient apparently stopped taking Synthroid.  Significantly elevated TSH with low free T4. -Start Synthroid at 100 mcg. -Patient need for repeat TSH with PCP in 3 to 4 weeks

## 2022-11-26 NOTE — Consult Note (Signed)
Neurology Consultation  Reason for Consult: gait ataxia, instability and occasional tremulousness since Friday Referring Physician: Dr Kathie Rhodes. Amin  CC: gait ataxia, instability and occasional tremulousness since Friday  History is obtained from:patient and chart  HPI: Curtis Cobb is a 51 y.o. male who has a significant past medical history of substance abuse and alcohol abuse and has been sober for the past 18 months, history of anxiety depression, hypertension, presented to the emergency room for evaluation of rather abrupt onset of generalized weakness, tremors and inability to walk.  He said that his symptoms started suddenly Friday, 11/20/2022 in the morning when he was trying to get dressed.  His legs seem to give out and he could not seem to be walking normally.  He was having diffuse tremors all over his body at that time as well.  He also had a headache and had blurred vision associated with it and has had blurred vision since then.  He was seen in the emergency room and admitted for the gait dysfunction.  Given the headache, migraine cocktail was tried to see if that alleviated symptoms but it took care of the headache but not the other neurological symptoms that he presented with. Denies any current substance use or alcohol use.  Reports being sober for over a year and a half.  Prior to becoming sober, used to drink at least half a gallon of liquor a day for many years and also used multiple listed substances including stimulants and opiates. Denies having had similar symptoms in the past. Denies any recent infection  ROS: Full ROS was performed and is negative except as noted in the HPI.  Past Medical History:  Diagnosis Date   Alcohol use disorder, moderate, dependence (HCC) 02/13/2014   Anxiety    COVID-19 03/29/2021   Diagnosed in [facility] ED on 03/29/2021   Depression    Hypertension    Rheumatoid arthritis (HCC)    Substance abuse (HCC)    Family History  Problem Relation  Age of Onset   Heart attack Mother    Stroke Mother    Hypertension Mother    Mental illness Mother        Manic Depressive Bipolar Disorder   Heart disease Mother    Other Father        unknown medical history   Hypertension Maternal Aunt    Mental illness Maternal Aunt    Hypertension Maternal Aunt    Mental illness Maternal Aunt    Hyperlipidemia Maternal Grandfather    Hypertension Maternal Grandfather    Mental illness Maternal Grandfather    Social History:   reports that he has never smoked. He has never used smokeless tobacco. He reports that he does not currently use alcohol. He reports that he does not currently use drugs after having used the following drugs: Amphetamines, Marijuana, Cocaine, Heroin, MDMA (Ecstacy), Codeine, Fentanyl, Benzodiazepines, "Crack" cocaine, Oxycodone, and Methamphetamines.  Medications  Current Facility-Administered Medications:    acetaminophen (TYLENOL) tablet 650 mg, 650 mg, Oral, Q6H PRN **OR** acetaminophen (TYLENOL) suppository 650 mg, 650 mg, Rectal, Q6H PRN, Cox, Amy N, DO   enoxaparin (LOVENOX) injection 40 mg, 40 mg, Subcutaneous, Q24H, Cox, Amy N, DO, 40 mg at 11/25/22 2147   FLUoxetine (PROZAC) capsule 40 mg, 40 mg, Oral, Daily, Cox, Amy N, DO, 40 mg at 11/26/22 0935   folic acid (FOLVITE) tablet 1 mg, 1 mg, Oral, Daily, Cox, Amy N, DO, 1 mg at 11/26/22 0935   labetalol (NORMODYNE) injection  5 mg, 5 mg, Intravenous, Q4H PRN, Cox, Amy N, DO   lactated ringers infusion, , Intravenous, Continuous, Arnetha Courser, MD, Last Rate: 100 mL/hr at 11/25/22 1805, Infusion Verify at 11/25/22 1805   levothyroxine (SYNTHROID) tablet 100 mcg, 100 mcg, Oral, Q0600, Arnetha Courser, MD, 100 mcg at 11/26/22 0604   LORazepam (ATIVAN) injection 1 mg, 1 mg, Intravenous, Q4H PRN, Cox, Amy N, DO, 1 mg at 11/25/22 0611   ondansetron (ZOFRAN) tablet 4 mg, 4 mg, Oral, Q6H PRN **OR** ondansetron (ZOFRAN) injection 4 mg, 4 mg, Intravenous, Q6H PRN, Cox, Amy N, DO    propranolol ER (INDERAL LA) 24 hr capsule 60 mg, 60 mg, Oral, Daily, Arnetha Courser, MD, 60 mg at 11/26/22 0935   senna-docusate (Senokot-S) tablet 1 tablet, 1 tablet, Oral, QHS PRN, Cox, Amy N, DO   zinc sulfate capsule, , Oral, Daily, Cox, Amy N, DO, 220 mg at 11/26/22 0935   Exam: Current vital signs: BP 124/89 (BP Location: Left Arm)   Pulse 74   Temp 98.4 F (36.9 C)   Resp 17   Ht 6\' 2"  (1.88 m)   Wt 90.7 kg   SpO2 100%   BMI 25.68 kg/m  Vital signs in last 24 hours: Temp:  [98.1 F (36.7 C)-98.7 F (37.1 C)] 98.4 F (36.9 C) (11/30 0741) Pulse Rate:  [73-98] 74 (11/30 0741) Resp:  [16-18] 17 (11/30 0741) BP: (98-137)/(62-97) 124/89 (11/30 0741) SpO2:  [97 %-100 %] 100 % (11/30 0741) General: He is awake alert oriented x 3 HEENT: Normocephalic, atraumatic Lungs: Clear Cardiovascular regular rhythm Abdomen nondistended nontender Extremities warm well-perfused Neurological exam Awake alert oriented x 3 Speech has no dysarthria but he has mild stutter, almost sounding like dysphonia from vocal cord spasms. No aphasia Cranial nerves II to XII: Pupils equal round react light, extraocular movement exam reveals broken saccades and 1-2 beats of nystagmus bilaterally, visual fields full, face appears symmetric, facial sensation intact, tongue and palate midline. Motor examination with antigravity strength in all 4 extremities.  Has mild action tremor in all fours. Sensory exam: Diminished sensation to extremity light touch and vibration up to the mid calves in the lower extremities.  Also has glove type sensory loss in the upper extremities-especially to the vibration modality.  Also has loss of joint position sense. DTRs: He has at least 1+ reflexes in the upper extremities and also 1+ delayed reflexes in the lower extremity. Toes are mute No Hoffman's, no Babinski.   Coordination exam with dysmetria noted in bilateral upper and lower extremities Gait: Walked with the physical  therapist with very unsteady gait.  He was able to bear weight but his knees almost kept buckling every few steps and he said he felt dizzy while walking  Labs I have reviewed labs in epic and the results pertinent to this consultation are:  CBC    Component Value Date/Time   WBC 7.2 11/26/2022 0609   RBC 4.26 11/26/2022 0609   HGB 12.7 (L) 11/26/2022 0609   HGB 14.3 12/10/2021 1008   HCT 37.9 (L) 11/26/2022 0609   HCT 42.5 12/10/2021 1008   PLT 255 11/26/2022 0609   PLT 331 12/10/2021 1008   MCV 89.0 11/26/2022 0609   MCV 88 12/10/2021 1008   MCH 29.8 11/26/2022 0609   MCHC 33.5 11/26/2022 0609   RDW 13.5 11/26/2022 0609   RDW 12.4 12/10/2021 1008   LYMPHSABS 1.6 11/24/2022 1213   LYMPHSABS 1.8 12/10/2021 1008   MONOABS 0.8 11/24/2022 1213  EOSABS 0.0 11/24/2022 1213   EOSABS 0.2 12/10/2021 1008   BASOSABS 0.1 11/24/2022 1213   BASOSABS 0.0 12/10/2021 1008    CMP     Component Value Date/Time   NA 140 11/26/2022 0609   NA 141 12/10/2021 1008   K 4.1 11/26/2022 0609   CL 107 11/26/2022 0609   CO2 27 11/26/2022 0609   GLUCOSE 83 11/26/2022 0609   BUN 10 11/26/2022 0609   BUN 14 12/10/2021 1008   CREATININE 0.96 11/26/2022 0609   CALCIUM 8.5 (L) 11/26/2022 0609   PROT 8.5 (H) 11/24/2022 1213   PROT 7.4 12/10/2021 1008   ALBUMIN 4.5 11/24/2022 1213   ALBUMIN 4.6 12/10/2021 1008   AST 28 11/24/2022 1213   ALT 16 11/24/2022 1213   ALKPHOS 59 11/24/2022 1213   BILITOT 0.7 11/24/2022 1213   BILITOT 0.5 12/10/2021 1008   GFRNONAA >60 11/26/2022 0609   GFRAA >60 01/28/2019 2057    Lipid Panel     Component Value Date/Time   CHOL 164 03/31/2021 1507   TRIG 235 (H) 03/31/2021 1507   HDL 28 (L) 03/31/2021 1507   CHOLHDL 5.9 03/31/2021 1507   VLDL 47 (H) 03/31/2021 1507   LDLCALC 89 03/31/2021 1507     Imaging I have reviewed the images obtained: MRI brain with no acute changes  Assessment: 51 year old man with extensive past history of substance abuse and  alcohol abuse, who has been clean and sober he says about 18 months, hypothyroidism, anxiety, depression, hypertension presenting to the ER for evaluation of symptoms of gait imbalance gait instability and generalized body tremulousness. He says his symptoms started rather suddenly on Friday.  On examination, no cranial nerve deficits except for hypomimia, no significant motor deficits, significant reduction in vibratory and joint position sense bilaterally and a glove and stocking type neuropathic pattern, he does not have any myelopathic features but he does exhibit some action tremor as well as significant gait ataxia and ataxia in bilateral upper and lower extremities in the setting of sensory loss making which would be sensory ataxia as well as cerebellar ataxia. Given his history of alcohol abuse for a long time, that could be the underlying cause but the more acute cause for his deterioration in neurological function could be severe hypothyroidism-his TSH is greater than 20.  Cord compression less likely but imaging of the cervical spine because of the involvement of upper and lower extremity should also be performed.  Impression: Severe hypothyroidism leading to ataxia and possible parkinsonian features such as gait instability Unclear cause for tremulousness. Posterior column dysfunction-rule out tabes dorsalis as well as B12 deficiency which has been ruled out. Although exam not consistent with myelopathy, rule out posterior cord compression due to dorsal column findings on exam  Recommendations: Check zinc levels Stop zinc supplementation Check RPR, HIV. I would also scan his cord given arm and leg symptoms-MRI of the cervical spine with and without contrast. Management of hypothyroidism per primary team I will follow-up after the above testing with you.  I have ordered zinc levels, RPR, HIV and MRI of the C-spine My plan was discussed with Dr. Nelson Chimes via secure chat -- Milon Dikes,  MD Neurologist [facility] Pager: [phone]

## 2022-11-26 NOTE — Assessment & Plan Note (Signed)
-   Patient was given methotrexate and Enbrel but not taking it  - Patient to follow-up with rheumatology as appropriate

## 2022-11-27 ENCOUNTER — Inpatient Hospital Stay: Payer: Medicaid Other

## 2022-11-27 DIAGNOSIS — I10 Essential (primary) hypertension: Secondary | ICD-10-CM | POA: Diagnosis present

## 2022-11-27 DIAGNOSIS — R2689 Other abnormalities of gait and mobility: Secondary | ICD-10-CM | POA: Diagnosis not present

## 2022-11-27 DIAGNOSIS — E039 Hypothyroidism, unspecified: Secondary | ICD-10-CM | POA: Diagnosis present

## 2022-11-27 DIAGNOSIS — Z9151 Personal history of suicidal behavior: Secondary | ICD-10-CM | POA: Diagnosis not present

## 2022-11-27 DIAGNOSIS — Z818 Family history of other mental and behavioral disorders: Secondary | ICD-10-CM | POA: Diagnosis not present

## 2022-11-27 DIAGNOSIS — Z882 Allergy status to sulfonamides status: Secondary | ICD-10-CM | POA: Diagnosis not present

## 2022-11-27 DIAGNOSIS — F1021 Alcohol dependence, in remission: Secondary | ICD-10-CM | POA: Diagnosis present

## 2022-11-27 DIAGNOSIS — T381X6A Underdosing of thyroid hormones and substitutes, initial encounter: Secondary | ICD-10-CM | POA: Diagnosis present

## 2022-11-27 DIAGNOSIS — Z8249 Family history of ischemic heart disease and other diseases of the circulatory system: Secondary | ICD-10-CM | POA: Diagnosis not present

## 2022-11-27 DIAGNOSIS — F419 Anxiety disorder, unspecified: Secondary | ICD-10-CM | POA: Diagnosis present

## 2022-11-27 DIAGNOSIS — E860 Dehydration: Secondary | ICD-10-CM | POA: Diagnosis present

## 2022-11-27 DIAGNOSIS — F332 Major depressive disorder, recurrent severe without psychotic features: Secondary | ICD-10-CM | POA: Diagnosis present

## 2022-11-27 DIAGNOSIS — E872 Acidosis, unspecified: Secondary | ICD-10-CM | POA: Diagnosis present

## 2022-11-27 DIAGNOSIS — E876 Hypokalemia: Secondary | ICD-10-CM | POA: Diagnosis present

## 2022-11-27 DIAGNOSIS — R270 Ataxia, unspecified: Secondary | ICD-10-CM

## 2022-11-27 DIAGNOSIS — R251 Tremor, unspecified: Secondary | ICD-10-CM | POA: Diagnosis present

## 2022-11-27 DIAGNOSIS — G8191 Hemiplegia, unspecified affecting right dominant side: Secondary | ICD-10-CM | POA: Diagnosis not present

## 2022-11-27 DIAGNOSIS — F1111 Opioid abuse, in remission: Secondary | ICD-10-CM | POA: Diagnosis present

## 2022-11-27 DIAGNOSIS — G43909 Migraine, unspecified, not intractable, without status migrainosus: Secondary | ICD-10-CM | POA: Diagnosis present

## 2022-11-27 DIAGNOSIS — Z79899 Other long term (current) drug therapy: Secondary | ICD-10-CM | POA: Diagnosis not present

## 2022-11-27 DIAGNOSIS — Z7952 Long term (current) use of systemic steroids: Secondary | ICD-10-CM | POA: Diagnosis not present

## 2022-11-27 DIAGNOSIS — Z8616 Personal history of COVID-19: Secondary | ICD-10-CM | POA: Diagnosis not present

## 2022-11-27 DIAGNOSIS — M1711 Unilateral primary osteoarthritis, right knee: Secondary | ICD-10-CM | POA: Diagnosis present

## 2022-11-27 DIAGNOSIS — R000 Tachycardia, unspecified: Secondary | ICD-10-CM | POA: Diagnosis not present

## 2022-11-27 DIAGNOSIS — M069 Rheumatoid arthritis, unspecified: Secondary | ICD-10-CM | POA: Diagnosis present

## 2022-11-27 LAB — RPR: RPR Ser Ql: NONREACTIVE

## 2022-11-27 NOTE — Progress Notes (Signed)
Physical Therapy Treatment Patient Details Name: Curtis Cobb MRN: [id number] DOB: 1971-08-12 Today's Date: 11/27/2022   History of Present Illness Pt is a 51 yo male presents to ED with c/o tremors and imbalance. Past medical history of alcohol use disorder, depression, anxiety, hypertension, schizoaffective disorder, rheumatoid arthritis, and history of substance abuse.    PT Comments    Pt reports he has been making attempts to return to his baseline routine for ad lib in-room AMB for BR trips. Pt able to AMB around unit using RW to maximize AMB tolerance of bilat degenerative knees. Worked on exploring balance exercises appropriate for home performance with use of bed and/or RW for safe performance. Will return later in day to work on stairs and make sure he's able to navigate and access his living environment. Located pt's personal belongings still in ED and brought them to pt in 86. Ataxia much improved, however gross motor control remains far off baseline. Pt remains frustrated with his acute difficulty controlling his body.    Recommendations for follow up therapy are one component of a multi-disciplinary discharge planning process, led by the attending physician.  Recommendations may be updated based on patient status, additional functional criteria and insurance authorization.  Follow Up Recommendations  Home health PT Can patient physically be transported by private vehicle: Yes   Assistance Recommended at Discharge Intermittent Supervision/Assistance  Patient can return home with the following A lot of help with walking and/or transfers;A lot of help with bathing/dressing/bathroom;Assistance with cooking/housework;Assist for transportation;Help with stairs or ramp for entrance   Equipment Recommendations  Rolling walker (2 wheels)    Recommendations for Other Services       Precautions / Restrictions Precautions Precautions: Fall Restrictions Weight Bearing  Restrictions: No     Mobility  Bed Mobility Overal bed mobility: Modified Independent                  Transfers Overall transfer level: Modified independent Equipment used: None               General transfer comment: has been mobilizing to BR ad lib now    Ambulation/Gait Ambulation/Gait assistance: Min guard Gait Distance (Feet): 240 Feet (268ft x2) Assistive device: Rolling walker (2 wheels) (to allow for improved AMB tolerance 2/2/ chronic knee RA) Gait Pattern/deviations: Ataxic       General Gait Details: Rt knee degernative valgus more easily visualized this time as Pryor Curia is able to increase distance from pt during AMB   Stairs             Wheelchair Mobility    Modified Rankin (Stroke Patients Only)       Balance                                            Cognition Arousal/Alertness: Awake/alert Behavior During Therapy: WFL for tasks assessed/performed, Anxious Overall Cognitive Status: Within Functional Limits for tasks assessed                                          Exercises Other Exercises Other Exercises: anterior toe taps at RW (hands free)  x4 (effort too high to achieve desired reps) Other Exercises: lateral side stepping with back to bed (1x14ft bilat) hands free Other Exercises:  standing, marching in place without UE assist 1x10    General Comments        Pertinent Vitals/Pain Pain Assessment Pain Assessment:  (typical aches in bilat knees)    Home Living                          Prior Function            PT Goals (current goals can now be found in the care plan section) Acute Rehab PT Goals Patient Stated Goal: to return home PT Goal Formulation: With patient Time For Goal Achievement: 12/09/22 Potential to Achieve Goals: Fair Progress towards PT goals: Progressing toward goals    Frequency    7X/week      PT Plan Discharge plan needs to be  updated;Current plan remains appropriate    Co-evaluation              AM-PAC PT "6 Clicks" Mobility   Outcome Measure  Help needed turning from your back to your side while in a flat bed without using bedrails?: None Help needed moving from lying on your back to sitting on the side of a flat bed without using bedrails?: None Help needed moving to and from a bed to a chair (including a wheelchair)?: None Help needed standing up from a chair using your arms (e.g., wheelchair or bedside chair)?: None Help needed to walk in hospital room?: None Help needed climbing 3-5 steps with a railing? : A Little 6 Click Score: 23    End of Session Equipment Utilized During Treatment: Gait belt Activity Tolerance: Patient limited by pain;Patient tolerated treatment well Patient left: with call bell/phone within reach (in bathroom) Nurse Communication: Mobility status PT Visit Diagnosis: Unsteadiness on feet (R26.81);Other abnormalities of gait and mobility (R26.89);Muscle weakness (generalized) (M62.81);Other symptoms and signs involving the nervous system (R29.898);Difficulty in walking, not elsewhere classified (R26.2)     Time: 1610-9604 PT Time Calculation (min) (ACUTE ONLY): 33 min  Charges:  $Gait Training: 8-22 mins $Therapeutic Exercise: 8-22 mins                    12:43 PM, 11/27/22 Rosamaria Lints, PT, DPT Physical Therapist - [facility]  [phone] (ASCOM)     Natisha Trzcinski C 11/27/2022, 12:34 PM

## 2022-11-27 NOTE — Assessment & Plan Note (Signed)
Blood pressure within goal without any antihypertensives at this time - Labetalol 5 mg IV q4h prn for sbp > 175, 4 doses ordered

## 2022-11-27 NOTE — Progress Notes (Signed)
Occupational Therapy Treatment Patient Details Name: Curtis Cobb MRN: [id number] DOB: 15-Apr-1971 Today's Date: 11/27/2022   History of present illness Pt is a 51 yo male presents to ED with c/o tremors and imbalance. Past medical history of alcohol use disorder, depression, anxiety, hypertension, schizoaffective disorder, rheumatoid arthritis, and history of substance abuse.   OT comments  Pt seen for OT tx. Pt returning to EOB from bathroom independently. Pt educated in cognitive behavioral pain coping strategies (including pleasant imagery, distraction, engaging in meaningful occupations, journaling, progressive muscle relaxation) and facilitated problem solving for implementation to maximize pt's comprehensive self mgt of chronic disease including chronic RA pain. Pt verbalized understanding and actively engaged in problem solving. Appreciative of education and expresses optimism. Pt progressing well towards goals. Recommendation updated.    Recommendations for follow up therapy are one component of a multi-disciplinary discharge planning process, led by the attending physician.  Recommendations may be updated based on patient status, additional functional criteria and insurance authorization.    Follow Up Recommendations  Home health OT     Assistance Recommended at Discharge PRN  Patient can return home with the following  A little help with walking and/or transfers;A little help with bathing/dressing/bathroom;Assistance with cooking/housework;Assist for transportation;Help with stairs or ramp for entrance   Equipment Recommendations  None recommended by OT    Recommendations for Other Services      Precautions / Restrictions Precautions Precautions: Fall Restrictions Weight Bearing Restrictions: No       Mobility Bed Mobility Overal bed mobility: Modified Independent                  Transfers Overall transfer level: Modified independent Equipment used:  None                     Balance Overall balance assessment: Needs assistance Sitting-balance support: Feet supported Sitting balance-Leahy Scale: Good     Standing balance support: No upper extremity supported, During functional activity Standing balance-Leahy Scale: Fair                             ADL either performed or assessed with clinical judgement   ADL Overall ADL's : Needs assistance/impaired                         Toilet Transfer: Modified Independent   Toileting- Clothing Manipulation and Hygiene: Modified independent              Extremity/Trunk Assessment              Vision       Perception     Praxis      Cognition Arousal/Alertness: Awake/alert Behavior During Therapy: WFL for tasks assessed/performed, Anxious Overall Cognitive Status: Within Functional Limits for tasks assessed                                          Exercises Other Exercises Other Exercises: Pt educated in cognitive behavioral pain coping strategies (including pleasant imagery, distraction, engaging in meaningful occupations, journaling, progressive muscle relaxation) and facilitated problem solving for implementation to maximize pt's comprehensive self mgt of chronic disease including chronic RA pain    Shoulder Instructions       General Comments      Pertinent Vitals/ Pain  Home Living                                          Prior Functioning/Environment              Frequency  Min 3X/week        Progress Toward Goals  OT Goals(current goals can now be found in the care plan section)  Progress towards OT goals: Progressing toward goals  Acute Rehab OT Goals Patient Stated Goal: to go home OT Goal Formulation: With patient Time For Goal Achievement: 12/09/22 Potential to Achieve Goals: Good  Plan Discharge plan needs to be updated;Frequency remains appropriate     Co-evaluation                 AM-PAC OT "6 Clicks" Daily Activity     Outcome Measure   Help from another person eating meals?: None Help from another person taking care of personal grooming?: None Help from another person toileting, which includes using toliet, bedpan, or urinal?: None Help from another person bathing (including washing, rinsing, drying)?: A Little Help from another person to put on and taking off regular upper body clothing?: None Help from another person to put on and taking off regular lower body clothing?: A Little 6 Click Score: 22    End of Session    OT Visit Diagnosis: Unsteadiness on feet (R26.81);Muscle weakness (generalized) (M62.81);Pain;Other symptoms and signs involving the nervous system (R29.898) Pain - Right/Left:  (both) Pain - part of body: Knee   Activity Tolerance Patient tolerated treatment well   Patient Left in bed;with call bell/phone within reach   Nurse Communication          Time: 1152-1209 OT Time Calculation (min): 17 min  Charges: OT General Charges $OT Visit: 1 Visit OT Treatments $Therapeutic Activity: 8-22 mins  Arman Filter., MPH, MS, OTR/L ascom [phone] 11/27/22, 12:42 PM

## 2022-11-27 NOTE — Progress Notes (Signed)
Physical Therapy Treatment Patient Details Name: Curtis Cobb MRN: [id number] DOB: July 19, 1971 Today's Date: 11/27/2022   History of Present Illness Pt is a 51 yo male presents to ED with c/o tremors and imbalance. Past medical history of alcohol use disorder, depression, anxiety, hypertension, schizoaffective disorder, rheumatoid arthritis, and history of substance abuse.    PT Comments    Pt continues to mobilize ab lib in room for toiletting needs and meals. He denies any regression in mobility since AM session.He remains in favor of using a RW at DC, at least having it in case it is needed. Pt is able to AMB ~23ft down hall to training stairs and perform 24x at supervision level with his typical movement pattern. He endorses feeling safe and able, but most limited by worsening pain in Rt posterior knee at a firm popliteal area- no redness seen. Pt asks about pain medications and author reached out to MD for clearance on ibuprofen which works well at home when combined with tylenol. Left pt a handout for balance activity, as well as word of encouragement as he remains overwhelmed by his current state- he admits to prior mental health tendencies for catastrophization and vocalizes a plan to maintain a positive attitude.     Recommendations for follow up therapy are one component of a multi-disciplinary discharge planning process, led by the attending physician.  Recommendations may be updated based on patient status, additional functional criteria and insurance authorization.  Follow Up Recommendations  Home health PT Can patient physically be transported by private vehicle: Yes   Assistance Recommended at Discharge Intermittent Supervision/Assistance  Patient can return home with the following A lot of help with walking and/or transfers;A lot of help with bathing/dressing/bathroom;Assistance with cooking/housework;Assist for transportation;Help with stairs or ramp for entrance    Equipment Recommendations  Rolling walker (2 wheels)    Recommendations for Other Services       Precautions / Restrictions Precautions Precautions: Fall Restrictions Weight Bearing Restrictions: No     Mobility  Bed Mobility Overal bed mobility: Modified Independent                  Transfers Overall transfer level: Modified independent Equipment used: None Transfers: Sit to/from Stand Sit to Stand: Modified independent (Device/Increase time)           General transfer comment: has been mobilizing to BR ad lib now    Ambulation/Gait Ambulation/Gait assistance: Min guard Gait Distance (Feet): 200 Feet (247ft twice) Assistive device: Rolling walker (2 wheels) Gait Pattern/deviations: Step-through pattern       General Gait Details: elects to continue to use RW for pain control; he movements are improving in fluidity, less apparent ataxia; 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 no LOB or safety concerns   Wheelchair Mobility    Modified Rankin (Stroke Patients Only)       Balance                                            Cognition Arousal/Alertness: Awake/alert Behavior During Therapy: WFL for tasks assessed/performed Overall Cognitive Status: Within Functional Limits for tasks assessed  Exercises Other Exercises Other Exercises: took handout for balance exercises for home Other Exercises: lateral side stepping with back to bed (1x77ft bilat) hands free Other Exercises: standing, marching in place without UE assist 1x10    General Comments        Pertinent Vitals/Pain Pain Assessment Pain Assessment:  0-10 Pain Score: 8  Pain Location: Knee pain in popliteal area Pain Descriptors / Indicators: Aching Pain Intervention(s): Limited activity within patient's tolerance, Monitored during session    Home Living                          Prior Function            PT Goals (current goals can now be found in the care plan section) Acute Rehab PT Goals Patient Stated Goal: to return home PT Goal Formulation: With patient Time For Goal Achievement: 12/09/22 Potential to Achieve Goals: Fair Progress towards PT goals: Progressing toward goals    Frequency    7X/week      PT Plan Current plan remains appropriate;Other (comment) (asked MD if he can have orders for ibuprofen now; Rt knee pain much worse than typical now)    Co-evaluation              AM-PAC PT "6 Clicks" Mobility   Outcome Measure  Help needed turning from your back to your side while in a flat bed without using bedrails?: None Help needed moving from lying on your back to sitting on the side of a flat bed without using bedrails?: None Help needed moving to and from a bed to a chair (including a wheelchair)?: None Help needed standing up from a chair using your arms (e.g., wheelchair or bedside chair)?: None Help needed to walk in hospital room?: None Help needed climbing 3-5 steps with a railing? : None 6 Click Score: 24    End of Session Equipment Utilized During Treatment: Gait belt Activity Tolerance: Patient limited by pain;Patient tolerated treatment well Patient left: with call bell/phone within reach;in bed;with nursing/sitter in room Nurse Communication: Mobility status PT Visit Diagnosis: Unsteadiness on feet (R26.81);Other abnormalities of gait and mobility (R26.89);Muscle weakness (generalized) (M62.81);Other symptoms and signs involving the nervous system (R29.898);Difficulty in walking, not elsewhere classified (R26.2)     Time: 8250-5397 PT Time Calculation (min) (ACUTE ONLY): 21  min  Charges:  $Therapeutic Exercise: 8-22 mins                    5:25 PM, 11/27/22 Rosamaria Lints, PT, DPT Physical Therapist - [facility]  [phone] (ASCOM)    Marcellino Fidalgo C 11/27/2022, 5:20 PM

## 2022-11-27 NOTE — TOC Progression Note (Addendum)
Transition of Care [facility]) - Progression Note    Patient Details  Name: Curtis Cobb MRN: [id number] Date of Birth: 1971-11-05  [facility]) CM/SW Contact  Truddie Hidden, RN Phone Number: 11/27/2022, 4:08 PM  Clinical Narrative:    Finiancial counselor emailed to confirm if patient's medicaid is pending. Per Carlena Hurl, medicaid app was just started today.   Referral for [facility] sent to [facility], [place]. Referral sent to Westley Hummer at [phone].   Request for charity RW sent to [facility] from [employer].        Expected Discharge Plan and Services                                                 Social Determinants of Health (SDOH) Interventions    Readmission Risk Interventions     No data to display

## 2022-11-27 NOTE — Plan of Care (Signed)
  Problem: Elimination: Goal: Will not experience complications related to bowel motility Outcome: Progressing   Problem: Nutrition: Goal: Adequate nutrition will be maintained Outcome: Progressing   Problem: Pain Managment: Goal: General experience of comfort will improve Outcome: Progressing   Problem: Skin Integrity: Goal: Risk for impaired skin integrity will decrease Outcome: Progressing   Problem: Safety: Goal: Ability to remain free from injury will improve Outcome: Progressing

## 2022-11-27 NOTE — Progress Notes (Signed)
Neurology Progress Note   S:// Seen and examined.  Reports some improvement in his strength in the lower extremities but still very ataxic   O:// Current vital signs: BP 123/84 (BP Location: Right Arm)   Pulse 85   Temp 98.2 F (36.8 C)   Resp 17   Ht 6\' 2"  (1.88 m)   Wt 90.7 kg   SpO2 100%   BMI 25.68 kg/m  Vital signs in last 24 hours: Temp:  [98.2 F (36.8 C)-98.8 F (37.1 C)] 98.2 F (36.8 C) (12/01 0841) Pulse Rate:  [69-85] 85 (12/01 0841) Resp:  [17] 17 (11/30 2315) BP: (121-130)/(82-89) 123/84 (12/01 0841) SpO2:  [98 %-100 %] 100 % (12/01 0841) General: Awake alert no distress HEENT: Normocephalic atraumatic Lungs: Clear Cardiovascular: Regular rhythm Neurological exam He is awake alert oriented x 3.  No dysarthria.  No stuttering today. No evidence of aphasia. Cranial nerves: Continues to have psychotic eye movements but no other deficits and cranial nerves II to XII otherwise. Motor examination antigravity strength in all 4 extremities.  Continues to have some tremulousness on raising the lower extremities. Sensation again diminished to vibration and light touch in a glove and stocking type pattern. Coordination: No dysmetria in the upper extremity today.  Remains ataxic in the lower extremities bilaterally Gait testing deferred DTRs at least 1+ all over.  Medications  Current Facility-Administered Medications:    acetaminophen (TYLENOL) tablet 650 mg, 650 mg, Oral, Q6H PRN, 650 mg at 11/27/22 1011 **OR** acetaminophen (TYLENOL) suppository 650 mg, 650 mg, Rectal, Q6H PRN, Cox, Amy N, DO   enoxaparin (LOVENOX) injection 40 mg, 40 mg, Subcutaneous, Q24H, Cox, Amy N, DO, 40 mg at 11/26/22 2129   FLUoxetine (PROZAC) capsule 40 mg, 40 mg, Oral, Daily, Cox, Amy N, DO, 40 mg at 11/27/22 1011   folic acid (FOLVITE) tablet 1 mg, 1 mg, Oral, Daily, Cox, Amy N, DO, 1 mg at 11/27/22 1011   labetalol (NORMODYNE) injection 5 mg, 5 mg, Intravenous, Q4H PRN, Cox, Amy N,  DO   lactated ringers infusion, , Intravenous, Continuous, 14/01/23, MD, Last Rate: 100 mL/hr at 11/25/22 1805, Infusion Verify at 11/25/22 1805   levothyroxine (SYNTHROID) tablet 100 mcg, 100 mcg, Oral, Q0600, 11/27/22, MD, 100 mcg at 11/27/22 0528   LORazepam (ATIVAN) injection 1 mg, 1 mg, Intravenous, Q4H PRN, Cox, Amy N, DO, 1 mg at 11/25/22 0611   ondansetron (ZOFRAN) tablet 4 mg, 4 mg, Oral, Q6H PRN **OR** ondansetron (ZOFRAN) injection 4 mg, 4 mg, Intravenous, Q6H PRN, Cox, Amy N, DO   propranolol ER (INDERAL LA) 24 hr capsule 60 mg, 60 mg, Oral, Daily, Amin, 11/27/22, MD, 60 mg at 11/27/22 1011   senna-docusate (Senokot-S) tablet 1 tablet, 1 tablet, Oral, QHS PRN, Cox, Amy N, DO Labs CBC    Component Value Date/Time   WBC 7.2 11/26/2022 0609   RBC 4.26 11/26/2022 0609   HGB 12.7 (L) 11/26/2022 0609   HGB 14.3 12/10/2021 1008   HCT 37.9 (L) 11/26/2022 0609   HCT 42.5 12/10/2021 1008   PLT 255 11/26/2022 0609   PLT 331 12/10/2021 1008   MCV 89.0 11/26/2022 0609   MCV 88 12/10/2021 1008   MCH 29.8 11/26/2022 0609   MCHC 33.5 11/26/2022 0609   RDW 13.5 11/26/2022 0609   RDW 12.4 12/10/2021 1008   LYMPHSABS 1.6 11/24/2022 1213   LYMPHSABS 1.8 12/10/2021 1008   MONOABS 0.8 11/24/2022 1213   EOSABS 0.0 11/24/2022 1213   EOSABS 0.2  12/10/2021 1008   BASOSABS 0.1 11/24/2022 1213   BASOSABS 0.0 12/10/2021 1008    CMP     Component Value Date/Time   NA 140 11/26/2022 0609   NA 141 12/10/2021 1008   K 4.1 11/26/2022 0609   CL 107 11/26/2022 0609   CO2 27 11/26/2022 0609   GLUCOSE 83 11/26/2022 0609   BUN 10 11/26/2022 0609   BUN 14 12/10/2021 1008   CREATININE 0.96 11/26/2022 0609   CALCIUM 8.5 (L) 11/26/2022 0609   PROT 8.5 (H) 11/24/2022 1213   PROT 7.4 12/10/2021 1008   ALBUMIN 4.5 11/24/2022 1213   ALBUMIN 4.6 12/10/2021 1008   AST 28 11/24/2022 1213   ALT 16 11/24/2022 1213   ALKPHOS 59 11/24/2022 1213   BILITOT 0.7 11/24/2022 1213   BILITOT 0.5  12/10/2021 1008   GFRNONAA >60 11/26/2022 0609   GFRAA >60 01/28/2019 2057   TSH severely deranged at 21.3 HIV negative B12 764 RPR pending Zinc level pending   Imaging I have reviewed images in epic and the results pertinent to this consultation are: MR brain with no acute changes MRI of the C-spine with no evidence of acute cord compression.  Multilevel DJD.  Please see the official read.  Assessment: 51 year old with extensive past history of substance and alcohol abuse who has been sober for about 18 months, hypothyroidism, anxiety, depression, rheumatoid arthritis, hypertension presenting to the ER for evaluation of gait imbalance gait instability and generalized body tremulousness.  He reports the symptoms to be of rather sudden onset but no clear cause in terms of his stroke or cord compression of the cervical cord has been identified. He does have significant gait ataxia and also right upper extremity ataxia which is somewhat better today.  He had stopped taking all his medications including his thyroid medications and his rheumatoid arthritis medications. I suspect that his ataxia is in the setting of severe hypothyroidism as well as history of alcohol abuse contributing to neuropathic symptoms causing sensory ataxia. There is no obvious cord compression.   Impression: Severe hypothyroidism leading to ataxia and possible gait instability Unclear about the cause of tremulousness but again could be multifactorial Posterior column dysfunction-B12 normal, RPR pending.  Zinc levels pending. C-spine imaging with no evidence of myelopathy.  Recommendations: Zinc levels pending RPR pending Management of hypothyroidism per primary team as you are PT OT Outpatient follow-up with neurology for EMG nerve conduction studies Plan was discussed with Dr. Nelson Chimes Please call with questions.  -- Milon Dikes, MD Neurologist [facility] Pager: [phone]

## 2022-11-27 NOTE — Progress Notes (Signed)
Progress Note   Patient: Curtis Cobb KPV:[id number] DOB: 1971-12-22 DOA: 11/24/2022     0 DOS: the patient was seen and examined on 11/27/2022   Brief hospital course: [name] is a 51 year old male with history of alcohol use disorder, depression, anxiety, hypertension, rheumatoid arthritis, history of substance abuse, who presents to the emergency department for chief concerns of tremors.  Initial vitals in the emergency department showed temperature 98.9, respiration rate of 24, heart rate of 133, blood pressure 148/104, SpO2 of 97% on room air.  Serum sodium is 136, potassium 3.3, chloride 102, bicarb 22, BUN of 9, serum creatinine of 0.98, EGFR greater than 60, nonfasting blood glucose 100, WBC 10.1, hemoglobin 14.1, platelets of 315.  ED treatment: Lorazepam 0.5 mg x 1, potassium chloride 40 mill equivalent one-time dose, LR 1 L bolus, sodium chloride 1 L bolus  Patient was being discharged from the emergency department, upon discharge, Mr. Muzquiz was unable to ambulate. Juice was provided patient denies further needs.  It was noted that he had visible bilateral hand tremors and inability to walk inside the emergency department.  11/29: Hemodynamically stable, labs remained stable, B12 at 764, procalcitonin negative, CK within normal limit, hypokalemia has been resolved, TSH significantly elevated at 21.276 with low free T4 at 0.59 which makes the diagnosis of hypothyroidism, starting him on Synthroid.  UDS was positive for tricyclic, patient is on tricyclic at home.  Blood culture negative in 24-hour, lactic acidosis resolved on subsequent check. Patient continued to have significant imbalance and unable to participate with PT, developed right-sided weakness-MRI brain was negative for any acute abnormality. PT and OT initially recommended CIR due to persistent imbalance, did not qualify for CIR. Patient is developing tachycardia with ambulation and remain quite  dizzy. Concern of atypical migraine-given migraine cocktail. PT will reevaluate tomorrow before discharge. Also getting some IV fluid for concern of dehydration.  11/30: Patient remained quite ataxic and tremulous.  Neurosurgery was consulted, there is a possibility of ataxia and parkinsonian features due to severe hypothyroidism. Concern of posterior column dysfunction, B12 is normal.  Neurology ordered C spinal MRI to rule out myelopathy/posterior cord compression due to dorsal column findings on exam. Also checking zinc levels, RPR and HIV.  12/1: Some improvement in lower extremity weakness but remained ataxic.  C-spine MRI with no evidence of myelopathy.  RPR and zinc levels pending.  HIV negative PT/OT are recommending home health.  Assessment and Plan: * Imbalance Patient continued to have significant ataxia. PT-they initially recommended CIR but he does not qualify, no recommending home health. MRI brain was done to rule out any posterior stroke and it was negative for any acute abnormality.  B12 normal Neurology was consulted. Severe hypothyroidism can also result in ataxia and possible parkinsonian features like gait instability. C-spine MRI was negative for myelopathy, HIV negative, pending zinc and RPR  Occasional tremors Imaging negative.  No concern of seizures. -Starting him on propranolol  HTN (hypertension) Blood pressure within goal without any antihypertensives at this time - Labetalol 5 mg IV q4h prn for sbp > 175, 4 doses ordered  Hypothyroidism Patient apparently stopped taking Synthroid.  Significantly elevated TSH with low free T4. -Start Synthroid at 100 mcg. -Patient need for repeat TSH with PCP in 3 to 4 weeks  Rheumatoid arthritis ([place]) - Patient was given methotrexate and Enbrel but not taking it  - Patient to follow-up with rheumatology as appropriate  MDD (major depressive disorder), recurrent episode, severe ([place]) -  Resumed home fluoxetine 40 mg  daily   Subjective: Per patient his lower extremity weakness is improving.  Continued to have ataxia and shuffling gait.  Physical Exam: Vitals:   11/26/22 1710 11/26/22 2315 11/27/22 0841 11/27/22 1548  BP: 130/89 121/82 123/84 131/86  Pulse: 78 69 85 79  Resp: 17 17    Temp: 98.8 F (37.1 C) 98.8 F (37.1 C) 98.2 F (36.8 C) 98.6 F (37 C)  TempSrc:      SpO2: 98% 98% 100% 96%  Weight:      Height:       General.  Well-developed gentleman, in no acute distress. Pulmonary.  Lungs clear bilaterally, normal respiratory effort. CV.  Regular rate and rhythm, no JVD, rub or murmur. Abdomen.  Soft, nontender, nondistended, BS positive. CNS.  Alert and oriented .  Ataxia with shuffling gait Extremities.  No edema, no cyanosis, pulses intact and symmetrical. Psychiatry.  Judgment and insight appears normal.   Data Reviewed: Prior data reviewed  Family Communication: Discussed with patient  Disposition: Inpatient Status is:  Planned Discharge Destination: Home with home health  20 prophylaxis.  Lovenox Time spent: 45 minutes  This record has been created using Systems analyst. Errors have been sought and corrected,but may not always be located. Such creation errors do not reflect on the standard of care.   Author: Lorella Nimrod, MD 11/27/2022 4:01 PM  For on call review [email or web address].

## 2022-11-28 DIAGNOSIS — R2689 Other abnormalities of gait and mobility: Secondary | ICD-10-CM | POA: Diagnosis not present

## 2022-11-28 MED ORDER — IBUPROFEN 400 MG PO TABS
600.0000 mg | ORAL_TABLET | Freq: Four times a day (QID) | ORAL | Status: DC | PRN
  Administered 2022-11-28 (×2): 600 mg via ORAL
  Filled 2022-11-28 (×3): qty 2

## 2022-11-28 MED ORDER — KETOROLAC TROMETHAMINE 30 MG/ML IJ SOLN
30.0000 mg | Freq: Once | INTRAMUSCULAR | Status: AC
  Administered 2022-11-28: 30 mg via INTRAVENOUS
  Filled 2022-11-28: qty 1

## 2022-11-28 NOTE — TOC Progression Note (Signed)
Transition of Care [facility]) - Progression Note    Patient Details  Name: Curtis Cobb MRN: [id number] Date of Birth: 1971-11-28  Transition of Care [facility]) CM/SW Contact  Bing Quarry, RN Phone Number: 11/28/2022, 3:50 PM  Clinical Narrative:  11/28/22: 350 pm. Attempted to reach out to sobriety group home, [facility], at [phone] with no answer and last attempt the VM box is full.  Spoke with patient for another contact name or number and he stated that was only one. He did not have a contact name, that they usually did not take back or have someone there to admit on weekends. RN CM Still trying to obtain [facility] agency with Medicaid application completed but pending. NOTE: Patient also stated he was not thinking clearly as he was in crisis. Updated provider, Dr. Nelson Chimes, of this via secure chat at 350 pm 11/28/22. Gabriel Cirri RN CM           Expected Discharge Plan and Services                                                 Social Determinants of Health (SDOH) Interventions    Readmission Risk Interventions     No data to display

## 2022-11-28 NOTE — Progress Notes (Signed)
Progress Note   Patient: Curtis Cobb BUL:[id number] DOB: 1971/11/21 DOA: 11/24/2022     1 DOS: the patient was seen and examined on 11/28/2022   Brief hospital course: [name] is a 51 year old male with history of alcohol use disorder, depression, anxiety, hypertension, rheumatoid arthritis, history of substance abuse, who presents to the emergency department for chief concerns of tremors.  Initial vitals in the emergency department showed temperature 98.9, respiration rate of 24, heart rate of 133, blood pressure 148/104, SpO2 of 97% on room air.  Serum sodium is 136, potassium 3.3, chloride 102, bicarb 22, BUN of 9, serum creatinine of 0.98, EGFR greater than 60, nonfasting blood glucose 100, WBC 10.1, hemoglobin 14.1, platelets of 315.  ED treatment: Lorazepam 0.5 mg x 1, potassium chloride 40 mill equivalent one-time dose, LR 1 L bolus, sodium chloride 1 L bolus  Patient was being discharged from the emergency department, upon discharge, [name] was unable to ambulate. Juice was provided patient denies further needs.  It was noted that he had visible bilateral hand tremors and inability to walk inside the emergency department.  11/29: Hemodynamically stable, labs remained stable, B12 at 764, procalcitonin negative, CK within normal limit, hypokalemia has been resolved, TSH significantly elevated at 21.276 with low free T4 at 0.59 which makes the diagnosis of hypothyroidism, starting him on Synthroid.  UDS was positive for tricyclic, patient is on tricyclic at home.  Blood culture negative in 24-hour, lactic acidosis resolved on subsequent check. Patient continued to have significant imbalance and unable to participate with PT, developed right-sided weakness-MRI brain was negative for any acute abnormality. PT and OT initially recommended CIR due to persistent imbalance, did not qualify for CIR. Patient is developing tachycardia with ambulation and remain quite  dizzy. Concern of atypical migraine-given migraine cocktail. PT will reevaluate tomorrow before discharge. Also getting some IV fluid for concern of dehydration.  11/30: Patient remained quite ataxic and tremulous.  Neurosurgery was consulted, there is a possibility of ataxia and parkinsonian features due to severe hypothyroidism. Concern of posterior column dysfunction, B12 is normal.  Neurology ordered C spinal MRI to rule out myelopathy/posterior cord compression due to dorsal column findings on exam. Also checking zinc levels, RPR and HIV.  12/1: Some improvement in lower extremity weakness but remained ataxic.  C-spine MRI with no evidence of myelopathy.  RPR and zinc levels pending.  HIV negative PT/OT are recommending home health.  12/2: RPR negative.  Zinc levels pending.  Right knee imaging was done as he was complaining of a lot of pain interfering with walking and it shows severe lateral and patellofemoral compartment osteoarthritis.  Patient need to see an orthopedic surgeon as an outpatient. Patient also need to get established with outpatient neurology to have nerve conduction studies done for further workup of his ataxia.  Unable to reach anyone at his group home, still awaiting response so we can discharge him  Assessment and Plan: * Imbalance Patient continued to have significant ataxia. PT-they initially recommended CIR but he does not qualify, no recommending home health. MRI brain was done to rule out any posterior stroke and it was negative for any acute abnormality.  B12 normal Neurology was consulted. Severe hypothyroidism can also result in ataxia and possible parkinsonian features like gait instability. C-spine MRI was negative for myelopathy, HIV negative, pending zinc and RPR  Occasional tremors Imaging negative.  No concern of seizures. -Starting him on propranolol  HTN (hypertension) Blood pressure within goal without any  antihypertensives at this time -  Labetalol 5 mg IV q4h prn for sbp > 175, 4 doses ordered  Hypothyroidism Patient apparently stopped taking Synthroid.  Significantly elevated TSH with low free T4. -Start Synthroid at 100 mcg. -Patient need for repeat TSH with PCP in 3 to 4 weeks  Rheumatoid arthritis ([place]) - Patient was given methotrexate and Enbrel but not taking it  - Patient to follow-up with rheumatology as appropriate  MDD (major depressive disorder), recurrent episode, severe ([place]) - Resumed home fluoxetine 40 mg daily   Subjective: Patient was resting comfortably when seen today.  Per patient his lower extremity weakness and imbalance seems improving but still has some but he was able to walk with the help of walker.  Physical Exam: Vitals:   11/27/22 1548 11/27/22 2303 11/28/22 0848 11/28/22 1534  BP: 131/86 (!) 138/97 (!) 101/53 91/60  Pulse: 79 96 86 82  Resp:  _0 Temp: 98.6 F (37 C) 99 F (37.2 C) 98.6 F (37 C) 98.5 F (36.9 C)  TempSrc:      SpO2: 96% 100% 97% 95%  Weight:      Height:       General.     In no acute distress. Pulmonary.  Lungs clear bilaterally, normal respiratory effort. CV.  Regular rate and rhythm, no JVD, rub or murmur. Abdomen.  Soft, nontender, nondistended, BS positive. CNS.  Alert and oriented .  No focal neurologic deficit. Extremities.  No edema, no cyanosis, pulses intact and symmetrical. Psychiatry.  Judgment and insight appears normal.    Data Reviewed: Prior data reviewed  Family Communication: Discussed with patient  Disposition: Inpatient Status is:  Planned Discharge Destination: Home with home health  20 prophylaxis.  Lovenox Time spent: 40 minutes  This record has been created using Systems analyst. Errors have been sought and corrected,but may not always be located. Such creation errors do not reflect on the standard of care.   Author: Lorella Nimrod, MD 11/28/2022 4:34 PM  For on call review [email or web address].

## 2022-11-28 NOTE — Progress Notes (Signed)
PT Cancellation Note  Patient Details Name: Curtis Cobb MRN: [id number] DOB: 09-Apr-1971   Cancelled Treatment:     PT attempt 2 x this date. First attempt, pt sleeping soundly requesting author return at a later time. Second attempt. " I'm not in a good place right now. I'm in a dark place spiritually and can't do my PT". Chartered loss adjuster discussed with Engineer, manufacturing. Called Chaplin to come talk to pt. PT will continue to follow per current POC.    [name] 11/28/2022, 12:06 PM

## 2022-11-28 NOTE — Progress Notes (Signed)
Chaplain responded to page to support pt. who shared he is in a dark place with staff. When Chaplain arrived pt. Was asleep. Chaplain services will follow-up.

## 2022-11-28 NOTE — Plan of Care (Signed)
  Problem: Clinical Measurements: Goal: Ability to maintain clinical measurements within normal limits will improve Outcome: Progressing   Problem: Education: Goal: Knowledge of General Education information will improve Description: Including pain rating scale, medication(s)/side effects and non-pharmacologic comfort measures Outcome: Progressing   Problem: Clinical Measurements: Goal: Cardiovascular complication will be avoided Outcome: Progressing   Problem: Activity: Goal: Risk for activity intolerance will decrease Outcome: Progressing   Problem: Safety: Goal: Ability to remain free from injury will improve Outcome: Progressing   Problem: Skin Integrity: Goal: Risk for impaired skin integrity will decrease Outcome: Progressing

## 2022-11-29 DIAGNOSIS — R2689 Other abnormalities of gait and mobility: Secondary | ICD-10-CM | POA: Diagnosis not present

## 2022-11-29 DIAGNOSIS — F331 Major depressive disorder, recurrent, moderate: Secondary | ICD-10-CM

## 2022-11-29 LAB — CULTURE, BLOOD (ROUTINE X 2)
Culture: NO GROWTH
Culture: NO GROWTH
Special Requests: ADEQUATE
Special Requests: ADEQUATE

## 2022-11-29 MED ORDER — HALOPERIDOL LACTATE 5 MG/ML IJ SOLN
2.0000 mg | Freq: Once | INTRAMUSCULAR | Status: AC
  Administered 2022-11-29: 2 mg via INTRAVENOUS
  Filled 2022-11-29: qty 1

## 2022-11-29 MED ORDER — OLANZAPINE 5 MG PO TABS
10.0000 mg | ORAL_TABLET | Freq: Once | ORAL | Status: AC
  Administered 2022-11-29: 10 mg via ORAL
  Filled 2022-11-29: qty 2

## 2022-11-29 MED ORDER — ARIPIPRAZOLE 2 MG PO TABS
2.0000 mg | ORAL_TABLET | Freq: Every day | ORAL | Status: DC
  Administered 2022-11-29 – 2022-11-30 (×2): 2 mg via ORAL
  Filled 2022-11-29 (×2): qty 1

## 2022-11-29 MED ORDER — FLUOXETINE HCL 20 MG PO CAPS
60.0000 mg | ORAL_CAPSULE | Freq: Every day | ORAL | Status: DC
  Administered 2022-11-30: 60 mg via ORAL
  Filled 2022-11-29: qty 3

## 2022-11-29 MED ORDER — FLUOXETINE HCL 20 MG PO CAPS
20.0000 mg | ORAL_CAPSULE | Freq: Once | ORAL | Status: AC
  Administered 2022-11-29: 20 mg via ORAL
  Filled 2022-11-29: qty 1

## 2022-11-29 MED ORDER — GABAPENTIN 100 MG PO CAPS
100.0000 mg | ORAL_CAPSULE | Freq: Three times a day (TID) | ORAL | Status: DC
  Administered 2022-11-29 – 2022-11-30 (×3): 100 mg via ORAL
  Filled 2022-11-29 (×3): qty 1

## 2022-11-29 NOTE — TOC Progression Note (Signed)
Transition of Care [facility]) - Progression Note    Patient Details  Name: Curtis Cobb MRN: [id number] Date of Birth: 11-02-1971  Transition of Care [facility]) CM/SW Contact  Bing Quarry, RN Phone Number: 11/29/2022, 5:37 PM  Clinical Narrative: 12/3: Unable to reach group home and VM box is still full. OF note: Patient became agitated last night. Ativan did not help per provider notes. Sitter was placed and psych was consulted. Gabriel Cirri RN CM           Expected Discharge Plan and Services                                                 Social Determinants of Health (SDOH) Interventions    Readmission Risk Interventions     No data to display

## 2022-11-29 NOTE — Progress Notes (Signed)
Progress Note   Patient: Curtis Cobb QMG:[id number] DOB: [birth year] DOA: 11/24/2022     2 DOS: the patient was seen and examined on 11/29/2022   Brief hospital course: Mr. Geran Haithcock is a 51 year old male with history of alcohol use disorder, depression, anxiety, hypertension, rheumatoid arthritis, history of substance abuse, who presents to the emergency department for chief concerns of tremors.  Initial vitals in the emergency department showed temperature 98.9, respiration rate of 24, heart rate of 133, blood pressure 148/104, SpO2 of 97% on room air.  Serum sodium is 136, potassium 3.3, chloride 102, bicarb 22, BUN of 9, serum creatinine of 0.98, EGFR greater than 60, nonfasting blood glucose 100, WBC 10.1, hemoglobin 14.1, platelets of 315.  ED treatment: Lorazepam 0.5 mg x 1, potassium chloride 40 mill equivalent one-time dose, LR 1 L bolus, sodium chloride 1 L bolus  Patient was being discharged from the emergency department, upon discharge, Mr. Lusty was unable to ambulate. Juice was provided patient denies further needs.  It was noted that he had visible bilateral hand tremors and inability to walk inside the emergency department.  11/29: Hemodynamically stable, labs remained stable, B12 at 764, procalcitonin negative, CK within normal limit, hypokalemia has been resolved, TSH significantly elevated at 21.276 with low free T4 at 0.59 which makes the diagnosis of hypothyroidism, starting him on Synthroid.  UDS was positive for tricyclic, patient is on tricyclic at home.  Blood culture negative in 24-hour, lactic acidosis resolved on subsequent check. Patient continued to have significant imbalance and unable to participate with PT, developed right-sided weakness-MRI brain was negative for any acute abnormality. PT and OT initially recommended CIR due to persistent imbalance, did not qualify for CIR. Patient is developing tachycardia with ambulation and remain quite  dizzy. Concern of atypical migraine-given migraine cocktail. PT will reevaluate tomorrow before discharge. Also getting some IV fluid for concern of dehydration.  11/30: Patient remained quite ataxic and tremulous.  Neurosurgery was consulted, there is a possibility of ataxia and parkinsonian features due to severe hypothyroidism. Concern of posterior column dysfunction, B12 is normal.  Neurology ordered C spinal MRI to rule out myelopathy/posterior cord compression due to dorsal column findings on exam. Also checking zinc levels, RPR and HIV.  12/1: Some improvement in lower extremity weakness but remained ataxic.  C-spine MRI with no evidence of myelopathy.  RPR and zinc levels pending.  HIV negative PT/OT are recommending home health.  12/2: RPR negative.  Zinc levels pending.  Right knee imaging was done as he was complaining of a lot of pain interfering with walking and it shows severe lateral and patellofemoral compartment osteoarthritis.  Patient need to see an orthopedic surgeon as an outpatient. Patient also need to get established with outpatient neurology to have nerve conduction studies done for further workup of his ataxia.  Unable to reach anyone at his group home, still awaiting response so we can discharge him.  12/3: Patient overnight became agitated and pacing around the room.  Per patient he feels like that he is going that black spot again, denies any suicidal thoughts or ideations but has an history of suicide twice.  Was given Ativan with no success.  Sitter was placed and psych was consulted by cross coverage. Patient seemed frustrated with his current living situation.  Further resources were provided and psych increase the dose of Prozac to 60 mg daily and also added gabapentin and Abilify. Unable to discharge to group home over the weekend, most likely can  go tomorrow  Assessment and Plan: * Imbalance Patient continued to improve. PT-they initially recommended CIR  but he does not qualify, no recommending home health. MRI brain was done to rule out any posterior stroke and it was negative for any acute abnormality.  B12 normal Neurology was consulted. Severe hypothyroidism can also result in ataxia and possible parkinsonian features like gait instability. C-spine MRI was negative for myelopathy, HIV negative, RPR negative pending zinc  Occasional tremors Imaging negative.  No concern of seizures. -Starting him on propranolol  HTN (hypertension) Blood pressure within goal  -He was also started on propranolol to help with tremors  Hypothyroidism Patient apparently stopped taking Synthroid.  Significantly elevated TSH with low free T4. -Start Synthroid at 100 mcg. -Patient need for repeat TSH with PCP in 3 to 4 weeks  Rheumatoid arthritis ([place]) - Patient was given methotrexate and Enbrel but not taking it  - Patient to follow-up with rheumatology as appropriate  Major depressive disorder, recurrent episode, moderate ([place]) Patient became very anxious and stating that he is at the verge of nervous breakdown.  Psych was consulted.  Denies any suicidal thoughts or ideations.  He was very frustrated with his current living situation and seems like that he is being incarcerated. -Psych increase the dose of Prozac from 40 mg to 60 mg daily -Started him on gabapentin 100 mg 3 times daily -Started Abilify 2 mg daily -He was provided with resources  MDD (major depressive disorder), recurrent episode, severe ([facility])-resolved as of 11/29/2022 - Resumed home fluoxetine 40 mg daily   Subjective: Patient was lying down in bed fully dressed, seems little anxious.  Stating that he is at the verge of breaking out.  Denies any suicidal thoughts or ideations.  Does not want to harm anyone.  He does want to help with his living situations at his current group home.  Ataxia seems improving.  Physical Exam: Vitals:   11/28/22 0848 11/28/22 1534 11/28/22 2339 11/29/22  0733  BP: (!) 101/53 91/60 121/75 (!) 127/90  Pulse: 86 82 82 (!) 109  Resp: _0 Temp: 98.6 F (37 C) 98.5 F (36.9 C) 98.1 F (36.7 C) 98.2 F (36.8 C)  TempSrc:      SpO2: 97% 95% 97% 100%  Weight:      Height:       General.  Well-developed gentleman, in no acute distress. Pulmonary.  Lungs clear bilaterally, normal respiratory effort. CV.  Regular rate and rhythm, no JVD, rub or murmur. Abdomen.  Soft, nontender, nondistended, BS positive. CNS.  Alert and oriented .  No focal neurologic deficit. Extremities.  No edema, no cyanosis, pulses intact and symmetrical. Psychiatry.  Judgment and insight appears normal.    Data Reviewed: Prior data reviewed  Family Communication: Discussed with patient  Disposition: Inpatient Status is:  Planned Discharge Destination: Home with home health  20 prophylaxis.  Lovenox Time spent: 42 minutes  This record has been created using Systems analyst. Errors have been sought and corrected,but may not always be located. Such creation errors do not reflect on the standard of care.   Author: Lorella Nimrod, MD 11/29/2022 2:41 PM  For on call review [email or web address].

## 2022-11-29 NOTE — Progress Notes (Signed)
The patient has requested his bed alarm off.  Patient was educated on the reasons why he needs to have the bed alarm on. Patient states that he is aware and will call before he gets up out the bed for anything and requests that the alarm to be turned off at this time.

## 2022-11-29 NOTE — Progress Notes (Signed)
PT Cancellation Note  Patient Details Name: Curtis Cobb MRN: [id number] DOB: 05/05/1971   Cancelled Treatment:    Reason Eval/Treat Not Completed: Medical issues which prohibited therapy;Other (comment)  Chart reviewed and discussed with RN.  Decision to hold therapy today.     Danielle Dess 11/29/2022, 1:07 PM

## 2022-11-29 NOTE — Assessment & Plan Note (Signed)
Patient became very anxious and stating that he is at the verge of nervous breakdown.  Psych was consulted.  Denies any suicidal thoughts or ideations.  He was very frustrated with his current living situation and seems like that he is being incarcerated. -Psych increase the dose of Prozac from 40 mg to 60 mg daily -Started him on gabapentin 100 mg 3 times daily -Started Abilify 2 mg daily -He was provided with resources

## 2022-11-29 NOTE — Progress Notes (Signed)
Nurse witnessed patient getting had sanatizer from the outside of the room and putting it in a cup.  Nurse asked the patient what he was planning on doing with it.  Patient stated that he was trying to clean his hands because there was no sanitizer in the room.  Nurse remained at the bedside.

## 2022-11-29 NOTE — Progress Notes (Addendum)
Pt adamant about leaving the unit. Pt walked out of the unit and entered the elevator. Notified AC, security, attending MD, and Psych NP/PA. Pt expressed to [facility] " I am not comfortable here and as of now, I am not feeling comfortable in my own skin. They have given me medications but it is not working." [facility] and security escorted pt back to room. Paged and reported pt's current state to Attending Psych NP. New medication order in place.

## 2022-11-29 NOTE — Plan of Care (Signed)
  Problem: Education: Goal: Knowledge of General Education information will improve Description: Including pain rating scale, medication(s)/side effects and non-pharmacologic comfort measures Outcome: Progressing   Problem: Elimination: Goal: Will not experience complications related to bowel motility Outcome: Progressing   Problem: Nutrition: Goal: Adequate nutrition will be maintained Outcome: Progressing   Problem: Activity: Goal: Risk for activity intolerance will decrease Outcome: Progressing   Problem: Pain Managment: Goal: General experience of comfort will improve Outcome: Progressing

## 2022-11-29 NOTE — Progress Notes (Signed)
Psych consult was ordered as well as a Comptroller.  The Nurse Tech is now at the bedside with the patient.

## 2022-11-29 NOTE — Progress Notes (Signed)
       CROSS COVER NOTE  NAME: Curtis Cobb MRN: [id number] DOB : June 13, 1971    Date of Service   12/0 /2023  HPI/Events of Note   Patient told nurse he hasn't felt this bad since the 80's feeling "boxed in" highly anxious. Admitted past suicide attempts   Assessment and  Interventions   Assessment: High elopement and suicide risk though no suicide ideation at this time. No improvement in patient symptoms after the ativan ordered Plan: IP psych consult Safety sitter       Donnie Mesa NP [facility]

## 2022-11-29 NOTE — Consult Note (Addendum)
[facility] Face-to-Face Psychiatry Consult   Reason for Consult:  depression and anxiety Referring Physician:  Dr Nelson Chimes Patient Identification: Curtis Cobb MRN:  [id number] Principal Diagnosis: Imbalance Diagnosis:  Principal Problem:   Imbalance Active Problems:   HTN (hypertension)   Rheumatoid arthritis (HCC)   Hypothyroidism   Occasional tremors   Ataxia   Major depressive disorder, recurrent episode, moderate (HCC)   Total Time spent with patient: 45 minutes  Subjective:   Curtis Cobb is a 51 y.o. male patient admitted with ataxia, consult for depression and anxiety. "I've had anxiety and depression since about 4 a.m"  HPI:  Curtis Cobb is a 51 y.o. male patient admitted with ataxia. Patient states he has been taking Prozac 40mg , but is not sure it is working. Asked if he could try an additional medication to address his symptoms. Patient is open to Abilify and gabapentin. Patient reports staying at the [facility] in [place], but is not sure as to why he is not returned. Does contribute his trigger this morning of depression and anxiety regarding his living situation living as he stated "it is supposed to be clean and sober there but it is not clean and sober." Is open to receiving resources to connect him with outpatient therapy in the community, place in discharge instructions. "I'm very much unplugged." Patient currently denies any suicidal ideations, homicidal ideations, and auditory or visual hallucinations.   Specifically, he contributes his sudden depression and anxiety this morning with reported suicidal ideations because he does not like his housing situation.  No suicidal ideations on assessment.  Part of his feeling "boxed in" may be associated with his time in prison which is often the case when someone cannot leave a place after incarcerated.  Past Psychiatric History: alcohol use d/o, depression, anxiety  Risk to Self:  none Risk to Others: none   Prior Inpatient Therapy:  several Prior Outpatient Therapy:  none  Past Medical History:  Past Medical History:  Diagnosis Date   Alcohol use disorder, moderate, dependence (HCC) 02/13/2014   Anxiety    COVID-19 03/29/2021   Diagnosed in [facility] ED on 03/29/2021   Depression    Hypertension    Rheumatoid arthritis (HCC)    Substance abuse [facility])     Past Surgical History:  Procedure Laterality Date   INCISION AND DRAINAGE ABSCESS Right 08/25/2018   Procedure: INCISION AND DRAINAGE ABSCESS;  Surgeon: 08/27/2018, MD;  Location: [facility] ORS;  Service: Orthopedics;  Laterality: Right;   INCISION AND DRAINAGE ABSCESS Right 08/30/2018   Procedure: INCISION AND DRAINAGE ABSCESS;  Surgeon: 10/30/2018, MD;  Location: [facility] ORS;  Service: Orthopedics;  Laterality: Right;   none     Family History:  Family History  Problem Relation Age of Onset   Heart attack Mother    Stroke Mother    Hypertension Mother    Mental illness Mother        Manic Depressive Bipolar Disorder   Heart disease Mother    Other Father        unknown medical history   Hypertension Maternal Aunt    Mental illness Maternal Aunt    Hypertension Maternal Aunt    Mental illness Maternal Aunt    Hyperlipidemia Maternal Grandfather    Hypertension Maternal Grandfather    Mental illness Maternal Grandfather    Family Psychiatric  History: see above Social History:  Social History   Substance and Sexual Activity  Alcohol Use Not Currently  Comment: 1/2 gal of liquor daily, last use 06/27/2021     Social History   Substance and Sexual Activity  Drug Use Not Currently   Types: Amphetamines, Marijuana, Cocaine, Heroin, MDMA (Ecstacy), Codeine, Fentanyl, Benzodiazepines, "Crack" cocaine, Oxycodone, Methamphetamines   Comment: Patient denies recent drug use. last use 2016    Social History   Socioeconomic History   Marital status: Single    Spouse name: Not on file   Number of children: Not on file   Years of  education: Not on file   Highest education level: Not on file  Occupational History   Not on file  Tobacco Use   Smoking status: Never   Smokeless tobacco: Never   Tobacco comments:    Patient is a resident at [facility] for alcohol abuse and previous drug abuse (2016). Patient was homeless prior to going to [facility].  Vaping Use   Vaping Use: Never used  Substance and Sexual Activity   Alcohol use: Not Currently    Comment: 1/2 gal of liquor daily, last use 06/27/2021   Drug use: Not Currently    Types: Amphetamines, Marijuana, Cocaine, Heroin, MDMA (Ecstacy), Codeine, Fentanyl, Benzodiazepines, "Crack" cocaine, Oxycodone, Methamphetamines    Comment: Patient denies recent drug use. last use 2016   Sexual activity: Not Currently    Birth control/protection: None  Other Topics Concern   Not on file  Social History Narrative   Not on file   Social Determinants of Health   Financial Resource Strain: Not on file  Food Insecurity: No Food Insecurity (11/25/2022)   Hunger Vital Sign    Worried About Running Out of Food in the Last Year: Never true    Ran Out of Food in the Last Year: Never true  Transportation Needs: No Transportation Needs (11/25/2022)   PRAPARE - Administrator, Civil Service (Medical): No    Lack of Transportation (Non-Medical): No  Physical Activity: Not on file  Stress: Not on file  Social Connections: Not on file   Additional Social History:    Allergies:   Allergies  Allergen Reactions   Sulfa Antibiotics Other (See Comments)    Unknown reaction Occurred as a child    Labs: No results found for this or any previous visit (from the past 48 hour(s)).  Current Facility-Administered Medications  Medication Dose Route Frequency Provider Last Rate Last Admin   acetaminophen (TYLENOL) tablet 650 mg  650 mg Oral Q6H PRN Cox, Amy N, DO   650 mg at 11/28/22 1814   Or   acetaminophen (TYLENOL) suppository 650 mg  650 mg Rectal Q6H PRN Cox, Amy N, DO        ARIPiprazole (ABILIFY) tablet 2 mg  2 mg Oral Daily Esau Fridman Y, NP       enoxaparin (LOVENOX) injection 40 mg  40 mg Subcutaneous Q24H Cox, Amy N, DO   40 mg at 11/28/22 2150   FLUoxetine (PROZAC) capsule 20 mg  20 mg Oral Once Charm Rings, NP       [START ON 11/30/2022] FLUoxetine (PROZAC) capsule 60 mg  60 mg Oral Daily Charm Rings, NP       folic acid (FOLVITE) tablet 1 mg  1 mg Oral Daily Cox, Amy N, DO   1 mg at 11/29/22 0933   gabapentin (NEURONTIN) capsule 100 mg  100 mg Oral TID Charm Rings, NP       ibuprofen (ADVIL) tablet 600 mg  600 mg Oral Q6H  PRN Arnetha Courser, MD   600 mg at 11/28/22 1814   labetalol (NORMODYNE) injection 5 mg  5 mg Intravenous Q4H PRN Cox, Amy N, DO       levothyroxine (SYNTHROID) tablet 100 mcg  100 mcg Oral Q0600 Arnetha Courser, MD   100 mcg at 11/29/22 0523   ondansetron (ZOFRAN) tablet 4 mg  4 mg Oral Q6H PRN Cox, Amy N, DO       Or   ondansetron (ZOFRAN) injection 4 mg  4 mg Intravenous Q6H PRN Cox, Amy N, DO       propranolol ER (INDERAL LA) 24 hr capsule 60 mg  60 mg Oral Daily Arnetha Courser, MD   60 mg at 11/29/22 0933   senna-docusate (Senokot-S) tablet 1 tablet  1 tablet Oral QHS PRN Cox, Amy N, DO        Musculoskeletal: Strength & Muscle Tone: within normal limits Gait & Station:  did not witness Patient leans: N/A  Psychiatric Specialty Exam: Physical Exam Vitals and nursing note reviewed.  Constitutional:      Appearance: Normal appearance.  HENT:     Head: Normocephalic.     Nose: Nose normal.  Pulmonary:     Effort: Pulmonary effort is normal.  Musculoskeletal:     Cervical back: Normal range of motion.  Neurological:     General: No focal deficit present.     Mental Status: He is alert.  Psychiatric:        Attention and Perception: Attention and perception normal.        Mood and Affect: Mood is anxious and depressed.        Speech: Speech normal.        Behavior: Behavior normal. Behavior is cooperative.         Thought Content: Thought content normal.        Cognition and Memory: Cognition and memory normal.        Judgment: Judgment normal.     Review of Systems  Psychiatric/Behavioral:  Positive for depression and substance abuse. The patient is nervous/anxious.   All other systems reviewed and are negative.   Blood pressure (!) 127/90, pulse (!) 109, temperature 98.2 F (36.8 C), resp. rate 18, height 6\' 2"  (1.88 m), weight 90.7 kg, SpO2 100 %.Body mass index is 25.68 kg/m.  General Appearance: Casual  Eye Contact:  Good  Speech:  Normal Rate  Volume:  Normal  Mood:  Anxious and Depressed  Affect:  Congruent  Thought Process:  Coherent  Orientation:  Full (Time, Place, and Person)  Thought Content:  WDL and Logical  Suicidal Thoughts:  No  Homicidal Thoughts:  No  Memory:  Immediate;   Good Recent;   Good Remote;   Good  Judgement:  Fair  Insight:  Fair  Psychomotor Activity:  Normal  Concentration:  Concentration: Good and Attention Span: Good  Recall:  Good  Fund of Knowledge:  Good  Language:  Good  Akathisia:  No  Handed:  Right  AIMS (if indicated):     Assets:  Housing Leisure Time Resilience Social Support  ADL's:  Intact  Cognition:  WNL  Sleep:        Physical Exam: Physical Exam Vitals and nursing note reviewed.  Constitutional:      Appearance: Normal appearance.  HENT:     Head: Normocephalic.     Nose: Nose normal.  Pulmonary:     Effort: Pulmonary effort is normal.  Musculoskeletal:  Cervical back: Normal range of motion.  Neurological:     General: No focal deficit present.     Mental Status: He is alert.  Psychiatric:        Attention and Perception: Attention and perception normal.        Mood and Affect: Mood is anxious and depressed.        Speech: Speech normal.        Behavior: Behavior normal. Behavior is cooperative.        Thought Content: Thought content normal.        Cognition and Memory: Cognition and memory normal.         Judgment: Judgment normal.    Review of Systems  Psychiatric/Behavioral:  Positive for depression and substance abuse. The patient is nervous/anxious.   All other systems reviewed and are negative.  Blood pressure (!) 127/90, pulse (!) 109, temperature 98.2 F (36.8 C), resp. rate 18, height 6\' 2"  (1.88 m), weight 90.7 kg, SpO2 100 %. Body mass index is 25.68 kg/m.  Treatment Plan Summary: Major depressive disorder, recurrent, moderate: Increased Prozac 40 mg daily to 60 mg daily Started gabapentin 100 mg TID Started Abilify 2 mg daily  Follow up with [facility]:  resources in discharge instructions  Disposition: No evidence of imminent risk to self or others at present.   Patient does not meet criteria for psychiatric inpatient admission. Supportive therapy provided about ongoing stressors.  , NP 11/29/2022 12:17 PM

## 2022-11-29 NOTE — Progress Notes (Addendum)
Nurse witnessed the patient outside of the room, and when the patient noticed that the nurse saw him, he ran back into the room.    Nurse went in to check on him and the patient stated "I feel like I'm caged in, like I need to leave".  Nurse then offered to take the patient for a walk around the unit.  During the walk nurse assessed how the patient was feeling.  Patient stated "I haven't felt like this in a long time" Nurse noted that the patient had anxiety.  Patient inquired if we had any medication for anxiety and added that " they might not give me anything with my history"  When the patient got back in the room, nurse checked the [facility] and noted that the patient had PRN IV Lorazepam.  Nurse asked the patient if he had taken this medication before and her stated that he use to.  Nurse gave the medication at 0523.  At approx 0535, nurse went back in to check on the patient and he was pacing in his room.  Patient stated that he was feeling like he was having a crisis.  Nurse asked what that looked like for him and he stated that it feels like I dont have any hope, like the walls are caving in.  Nurse then asked if he felt like he was going to harm himself, and the patient looked at the nurse and did not answer.  Nurse then asked again and the patient stated he has a history of suicide where he was successful 2 times but was "brought back to this awful world twice".  Patient stated that he doesn't feel like he's going to harm himself now, "but I dont want to get to that point".  3220 Nurse stayed near the patient's room and paged Manuela Schwartz, NP.  Steward Drone called back and was informed of all the details above.

## 2022-11-30 ENCOUNTER — Other Ambulatory Visit: Payer: Self-pay

## 2022-11-30 DIAGNOSIS — R2689 Other abnormalities of gait and mobility: Secondary | ICD-10-CM | POA: Diagnosis not present

## 2022-11-30 MED ORDER — MELATONIN 5 MG PO TABS
5.0000 mg | ORAL_TABLET | Freq: Once | ORAL | Status: AC
  Administered 2022-11-30: 5 mg via ORAL
  Filled 2022-11-30: qty 1

## 2022-11-30 MED ORDER — PROPRANOLOL HCL ER 60 MG PO CP24: 60.0000 mg | ORAL_CAPSULE | Freq: Every day | ORAL | 2 refills | Status: DC

## 2022-11-30 MED ORDER — ARIPIPRAZOLE 2 MG PO TABS
2.0000 mg | ORAL_TABLET | Freq: Every day | ORAL | 1 refills | Status: DC
  Filled 2022-11-30: qty 30, 30d supply, fill #0

## 2022-11-30 MED ORDER — ARIPIPRAZOLE 2 MG PO TABS: 2.0000 mg | ORAL_TABLET | Freq: Every day | ORAL | 1 refills | Status: DC

## 2022-11-30 MED ORDER — LEVOTHYROXINE SODIUM 100 MCG PO TABS: 100.0000 ug | ORAL_TABLET | Freq: Every day | ORAL | 1 refills | Status: DC

## 2022-11-30 MED ORDER — LEVOTHYROXINE SODIUM 100 MCG PO TABS
100.0000 ug | ORAL_TABLET | Freq: Every day | ORAL | 1 refills | Status: DC
  Filled 2022-11-30: qty 90, 90d supply, fill #0

## 2022-11-30 MED ORDER — PROPRANOLOL HCL ER 60 MG PO CP24
60.0000 mg | ORAL_CAPSULE | Freq: Every day | ORAL | 2 refills | Status: DC
  Filled 2022-11-30: qty 30, 30d supply, fill #0

## 2022-11-30 MED ORDER — GABAPENTIN 100 MG PO CAPS
100.0000 mg | ORAL_CAPSULE | Freq: Three times a day (TID) | ORAL | 2 refills | Status: DC
  Filled 2022-11-30: qty 90, 30d supply, fill #0
  Filled 2022-12-31: qty 90, 30d supply, fill #1
  Filled 2023-02-03 (×2): qty 90, 30d supply, fill #2

## 2022-11-30 MED ORDER — GABAPENTIN 100 MG PO CAPS: 100.0000 mg | ORAL_CAPSULE | Freq: Three times a day (TID) | ORAL | 2 refills | Status: DC

## 2022-11-30 MED ORDER — FLUOXETINE HCL 20 MG PO CAPS: 60.0000 mg | ORAL_CAPSULE | Freq: Every day | ORAL | 3 refills | Status: DC

## 2022-11-30 MED ORDER — FLUOXETINE HCL 20 MG PO CAPS
60.0000 mg | ORAL_CAPSULE | Freq: Every day | ORAL | 3 refills | Status: DC
  Filled 2022-11-30: qty 90, 30d supply, fill #0
  Filled 2023-02-03 (×2): qty 90, 30d supply, fill #1

## 2022-11-30 NOTE — Progress Notes (Signed)
Reviewed discharge Instructions with pt. Pt verbalized  understanding. IV intact. Staff wheeled pt out. Pt transported to facility/recovery home via taxy.

## 2022-11-30 NOTE — Progress Notes (Signed)
Physical Therapy Treatment Patient Details Name: Curtis Cobb MRN: [id number] DOB: 04-26-1971 Today's Date: 11/30/2022   History of Present Illness Pt is a 51 yo male presents to ED with c/o tremors and imbalance. Past medical history of alcohol use disorder, depression, anxiety, hypertension, schizoaffective disorder, rheumatoid arthritis, and history of substance abuse.    PT Comments    Pt awake and agrees to session.  Bed mobility independent.  Able to don jeans on his own.  Walks x 2 laps with supervision and up/down 4 steps with B rails and min guard/supervision.  Pt continues with gait/balance deficits but much improved since eval.  Pt stated he feels comfortable without AD and is not interested in one at this time.  He does have occasional imbalances but recovers on his own.  Returns to room and walks into bathroom on his own which he has been doing.  Sitter in room for safety.  Feels comfortable with mobility skills.  Does ask about driving and is referred to discuss with MD.    Recommendations for follow up therapy are one component of a multi-disciplinary discharge planning process, led by the attending physician.  Recommendations may be updated based on patient status, additional functional criteria and insurance authorization.  Follow Up Recommendations  Home health PT     Assistance Recommended at Discharge Intermittent Supervision/Assistance  Patient can return home with the following A little help with walking and/or transfers;Help with stairs or ramp for entrance;Assist for transportation   Equipment Recommendations       Recommendations for Other Services       Precautions / Restrictions Precautions Precautions: Fall Restrictions Weight Bearing Restrictions: No     Mobility  Bed Mobility Overal bed mobility: Modified Independent                  Transfers Overall transfer level: Modified independent                       Ambulation/Gait Ambulation/Gait assistance: Supervision Gait Distance (Feet): 500 Feet Assistive device: None Gait Pattern/deviations: Step-through pattern, Ataxic Gait velocity: good speed today without AD         Stairs Stairs: Yes Stairs assistance: Supervision Stair Management: Two rails Number of Stairs: 4 General stair comments: does well with good confidence   Wheelchair Mobility    Modified Rankin (Stroke Patients Only)       Balance Overall balance assessment: Needs assistance Sitting-balance support: Feet supported Sitting balance-Leahy Scale: Good     Standing balance support: No upper extremity supported, During functional activity Standing balance-Leahy Scale: Fair                              Cognition Arousal/Alertness: Awake/alert Behavior During Therapy: WFL for tasks assessed/performed Overall Cognitive Status: Within Functional Limits for tasks assessed                                          Exercises      General Comments        Pertinent Vitals/Pain Pain Assessment Pain Assessment: No/denies pain    Home Living                          Prior Function  PT Goals (current goals can now be found in the care plan section) Progress towards PT goals: Progressing toward goals    Frequency    7X/week      PT Plan Current plan remains appropriate    Co-evaluation              AM-PAC PT "6 Clicks" Mobility   Outcome Measure  Help needed turning from your back to your side while in a flat bed without using bedrails?: None Help needed moving from lying on your back to sitting on the side of a flat bed without using bedrails?: None Help needed moving to and from a bed to a chair (including a wheelchair)?: None Help needed standing up from a chair using your arms (e.g., wheelchair or bedside chair)?: None Help needed to walk in hospital room?: None Help needed  climbing 3-5 steps with a railing? : A Little 6 Click Score: 23    End of Session Equipment Utilized During Treatment: Gait belt Activity Tolerance: Patient tolerated treatment well Patient left: Other (comment) (bathroom.  sitter in room) Nurse Communication: Mobility status PT Visit Diagnosis: Unsteadiness on feet (R26.81);Other abnormalities of gait and mobility (R26.89);Muscle weakness (generalized) (M62.81);Other symptoms and signs involving the nervous system (R29.898);Difficulty in walking, not elsewhere classified (R26.2)     Time: 0947-0962 PT Time Calculation (min) (ACUTE ONLY): 8 min  Charges:  $Gait Training: 8-22 mins                   Danielle Dess, PTA 11/30/22, 9:10 AM

## 2022-11-30 NOTE — TOC Transition Note (Addendum)
Transition of Care [facility]) - CM/SW Discharge Note   Patient Details  Name: Curtis Cobb MRN: [id number] Date of Birth: Aug 03, 1971  [facility] CM/SW Contact:  Laurena Slimmer, RN Phone Number: 11/30/2022, 12:19 PM   Clinical Narrative:    Spoke with patient at the bedside. He will return to the [facility]. He stated he has a key and is able to let himself in. Patient stated he previously got his medications via medication management. He was working but is no longer employed. Patient will be provided information for [facility]. And advised to complete the application and to schedule an appointment at the phone number listed on the application. He has no transportation and is requesting a taxi.   Patient referred to medication management  Met with patient at bedside to give Taxi voucher  TOC signing off.         Patient Goals and CMS Choice        Discharge Placement                       Discharge Plan and Services                                     Social Determinants of Health (SDOH) Interventions     Readmission Risk Interventions     No data to display

## 2022-11-30 NOTE — Progress Notes (Signed)
       CROSS COVER NOTE  NAME: Kirke Breach MRN: [id number] DOB : 1971/11/21 ATTENDING PHYSICIAN: Arnetha Courser, MD    Date of Service   11/30/2022   HPI/Events of Note   Medication request received for sleep aid.  Interventions   Assessment/Plan:  Melatonin     This document was prepared using Dragon voice recognition software and may include unintentional dictation errors.  Bishop Limbo DNP, MBA, FNP-BC Nurse Practitioner [facility] Pager [phone]

## 2022-11-30 NOTE — Discharge Summary (Signed)
Physician Discharge Summary   Patient: Curtis Cobb MRN: [id number] DOB: 09-11-1971  Admit date:     11/24/2022  Discharge date: 11/30/22  Discharge Physician: Lorella Nimrod   PCP: Patient, No Pcp Per   Recommendations at discharge:  Please obtain CBC and BMP in 1 week Follow-up on zinc levels Please repeat TSH in 3 to 4 weeks and adjust Synthroid dose accordingly. Follow-up with neurology-patient will need nerve conduction studies to rule out any neuropathy or dorsal column symptoms. Follow-up with psychiatry. Follow-up with orthopedic surgery for severe osteoarthritis of right knee which interfere with ambulation. Follow-up with rheumatology to restart rheumatoid arthritis management Follow-up with primary care provider within a week to coordinate all that  Discharge Diagnoses: Principal Problem:   Imbalance Active Problems:   Occasional tremors   HTN (hypertension)   Hypothyroidism   Rheumatoid arthritis ([place])   Major depressive disorder, recurrent episode, moderate ([place])   Ataxia   Hospital Course: Curtis Cobb is a 51 year old male with history of alcohol use disorder, depression, anxiety, hypertension, rheumatoid arthritis, history of substance abuse, who presents to the emergency department for chief concerns of tremors.  Initial vitals in the emergency department showed temperature 98.9, respiration rate of 24, heart rate of 133, blood pressure 148/104, SpO2 of 97% on room air.  Serum sodium is 136, potassium 3.3, chloride 102, bicarb 22, BUN of 9, serum creatinine of 0.98, EGFR greater than 60, nonfasting blood glucose 100, WBC 10.1, hemoglobin 14.1, platelets of 315.  ED treatment: Lorazepam 0.5 mg x 1, potassium chloride 40 mill equivalent one-time dose, LR 1 L bolus, sodium chloride 1 L bolus  Patient was being discharged from the emergency department, upon discharge, Curtis Cobb was unable to ambulate. Juice was provided patient denies further needs.  It  was noted that he had visible bilateral hand tremors and inability to walk inside the emergency department.  11/29: Hemodynamically stable, labs remained stable, B12 at 764, procalcitonin negative, CK within normal limit, hypokalemia has been resolved, TSH significantly elevated at 21.276 with low free T4 at 0.59 which makes the diagnosis of hypothyroidism, starting him on Synthroid.  UDS was positive for tricyclic, patient is on tricyclic at home.  Blood culture negative in 24-hour, lactic acidosis resolved on subsequent check. Patient continued to have significant imbalance and unable to participate with PT, developed right-sided weakness-MRI brain was negative for any acute abnormality. PT and OT initially recommended CIR due to persistent imbalance, did not qualify for CIR. Patient is developing tachycardia with ambulation and remain quite dizzy. Concern of atypical migraine-given migraine cocktail. PT will reevaluate tomorrow before discharge. Also getting some IV fluid for concern of dehydration.  11/30: Patient remained quite ataxic and tremulous.  Neurosurgery was consulted, there is a possibility of ataxia and parkinsonian features due to severe hypothyroidism. Concern of posterior column dysfunction, B12 is normal.  Neurology ordered C spinal MRI to rule out myelopathy/posterior cord compression due to dorsal column findings on exam. Also checking zinc levels, RPR and HIV.  12/1: Some improvement in lower extremity weakness but remained ataxic.  C-spine MRI with no evidence of myelopathy.  RPR and zinc levels pending.  HIV negative PT/OT are recommending home health.  12/2: RPR negative.  Zinc levels pending.  Right knee imaging was done as he was complaining of a lot of pain interfering with walking and it shows severe lateral and patellofemoral compartment osteoarthritis.  Patient need to see an orthopedic surgeon as an outpatient. Patient also need to  get established with outpatient  neurology to have nerve conduction studies done for further workup of his ataxia.  Unable to reach anyone at his group home, still awaiting response so we can discharge him.  12/3: Patient overnight became agitated and pacing around the room.  Per patient he feels like that he is going that black spot again, denies any suicidal thoughts or ideations but has an history of suicide twice.  Was given Ativan with no success.  Sitter was placed and psych was consulted by [facility]. Patient seemed frustrated with his current living situation.  Further resources were provided and psych increase the dose of Prozac to 60 mg daily and also added gabapentin and Abilify. Unable to discharge to group home over the weekend, most likely can go tomorrow  12/4: Patient remained stable.  Feeling much improved today no more agitation.  Took a shower.  Would like to go back to his place. Patient is given new prescriptions for increased dose of Prozac, gabapentin, Abilify and Synthroid.  He was also started on propranolol for tremors. Patient continued to have significant pain in left knee with ambulation, imaging with severe osteoarthritis.  Patient also need to follow-up with his rheumatologist to restart his rheumatoid arthritis treatment. Patient need to follow-up with neurology for nerve conduction studies for his ataxia although has improved after starting Synthroid. Patient will need a repeat TSH in 3 to 4 weeks and adjustment of Synthroid dose accordingly.  Patient will continue current medications and need to have a close follow-up with primary care provider, psychiatrist, neurology and orthopedic surgery.  Assessment and Plan: * Imbalance Patient continued to improve. PT-they initially recommended CIR but he does not qualify, no recommending home health. MRI brain was done to rule out any posterior stroke and it was negative for any acute abnormality.  B12 normal Neurology was consulted. Severe  hypothyroidism can also result in ataxia and possible parkinsonian features like gait instability. C-spine MRI was negative for myelopathy, HIV negative, RPR negative pending zinc  Occasional tremors Imaging negative.  No concern of seizures. -Starting him on propranolol  HTN (hypertension) Blood pressure within goal  -He was also started on propranolol to help with tremors  Hypothyroidism Patient apparently stopped taking Synthroid.  Significantly elevated TSH with low free T4. -Start Synthroid at 100 mcg. -Patient need for repeat TSH with PCP in 3 to 4 weeks  Rheumatoid arthritis ([place]) - Patient was given methotrexate and Enbrel but not taking it  - Patient to follow-up with rheumatology as appropriate  Major depressive disorder, recurrent episode, moderate ([place]) Patient became very anxious and stating that he is at the verge of nervous breakdown.  Psych was consulted.  Denies any suicidal thoughts or ideations.  He was very frustrated with his current living situation and seems like that he is being incarcerated. -Psych increase the dose of Prozac from 40 mg to 60 mg daily -Started him on gabapentin 100 mg 3 times daily -Started Abilify 2 mg daily -He was provided with resources  MDD (major depressive disorder), recurrent episode, severe (HCC)-resolved as of 11/29/2022 - Resumed home fluoxetine 40 mg daily   Consultants: Neurology, psychiatry Procedures performed: EEG Disposition: Group home Diet recommendation:  Discharge Diet Orders (From admission, onward)     Start     Ordered   11/30/22 0000  Diet - low sodium heart healthy        11/30/22 1102           Cardiac diet DISCHARGE  MEDICATION: Allergies as of 11/30/2022       Reactions   Sulfa Antibiotics Other (See Comments)   Unknown reaction Occurred as a child        Medication List     STOP taking these medications    amitriptyline 25 MG tablet Commonly known as: ELAVIL   busPIRone 15 MG  tablet Commonly known as: BUSPAR   folic acid 1 MG tablet Commonly known as: FOLVITE   predniSONE 5 MG tablet Commonly known as: DELTASONE       TAKE these medications    AMINO ACIDS PO Take 1 packet by mouth daily.   ARIPiprazole 2 MG tablet Commonly known as: ABILIFY Take 1 tablet (2 mg total) by mouth daily. Start taking on: December 01, 2022   FLUoxetine 20 MG capsule Commonly known as: PROZAC Take 3 capsules (60 mg total) by mouth daily. Start taking on: December 01, 2022 What changed:  medication strength how much to take when to take this   gabapentin 100 MG capsule Commonly known as: NEURONTIN Take 1 capsule (100 mg total) by mouth 3 (three) times daily.   hydroxychloroquine 200 MG tablet Commonly known as: Plaquenil Take 1 tablet (200 mg total) by mouth once daily.   ibuprofen 200 MG tablet Commonly known as: ADVIL Take 800 mg by mouth daily as needed.   IRON FORMULA PO Take 1 capsule by mouth daily.   levothyroxine 100 MCG tablet Commonly known as: SYNTHROID Take 1 tablet (100 mcg total) by mouth daily before breakfast.   methotrexate 2.5 MG tablet Commonly known as: RHEUMATREX Take 6 tablets (15 mg total) by mouth once a week. (Caution chemotherapy. Protect from light).   METHYL-FOLATE PO Take 1 capsule by mouth daily.   NON FORMULARY Take 3 capsules by mouth daily. Magnesium   propranolol ER 60 MG 24 hr capsule Commonly known as: INDERAL LA Take 1 capsule (60 mg total) by mouth daily.   ZINC PO Take 1 capsule by mouth daily.               Durable Medical Equipment  (From admission, onward)           Start     Ordered   11/27/22 1628  For home use only DME Walker rolling  Once       Question Answer Comment  Walker: With [place] Wheels   Patient needs a walker to treat with the following condition Ataxia      11/27/22 1627            Follow-up Information     [facility] .    Specialty: Emergency Medicine Why: If symptoms worsen Contact information: [place] [phone] [place], [place]. Schedule an appointment as soon as possible for a visit .   Specialty: General Practice Contact information: [place] [phone]                Discharge Exam: Filed Weights   11/24/22 1105 11/24/22 1156  Weight: 101 kg 90.7 kg   General.     In no acute distress. Pulmonary.  Lungs clear bilaterally, normal respiratory effort. CV.  Regular rate and rhythm, no JVD, rub or murmur. Abdomen.  Soft, nontender, nondistended, BS positive. CNS.  Alert and oriented .  No focal neurologic deficit. Extremities.  No edema, no cyanosis, pulses  intact and symmetrical. Psychiatry.  Judgment and insight appears normal.   Condition at discharge: stable  The results of significant diagnostics from this hospitalization (including imaging, microbiology, ancillary and laboratory) are listed below for reference.   Imaging Studies: DG Knee Complete 4 Views Right  Result Date: 11/27/2022 CLINICAL DATA:  Tremors and imbalance.  Ataxia. EXAM: RIGHT KNEE - COMPLETE 4+ VIEW COMPARISON:  None Available. FINDINGS: Severe lateral compartment joint space narrowing with large peripheral degenerative osteophytes. There is an oval well corticated loose body measuring up to 15 mm at the anterior medial aspect of the lateral compartment. The medial compartment joint space is maintained. There are large peripheral medial compartment degenerative osteophytes. Severe patellofemoral joint space narrowing and peripheral osteophytes, greatest at the superior aspect of the trochleae. No joint effusion. No acute fracture or dislocation. IMPRESSION: Severe lateral and patellofemoral compartment osteoarthritis. Electronically Signed   By: Yvonne Kendall M.D.   On:  11/27/2022 17:50   MR CERVICAL SPINE W WO CONTRAST  Result Date: 11/26/2022 CLINICAL DATA:  Cervical radiculopathy, infection suspected, no prior imaging EXAM: MRI CERVICAL SPINE WITHOUT AND WITH CONTRAST TECHNIQUE: Multiplanar and multiecho pulse sequences of the cervical spine, to include the craniocervical junction and cervicothoracic junction, were obtained without and with intravenous contrast. CONTRAST:  62m GADAVIST GADOBUTROL 1 MMOL/ML IV SOLN COMPARISON:  None Available. FINDINGS: Alignment: Reversal of the cervical lordosis centered at C3-C4 Vertebrae: No evidence of acute fracture, discitis, or aggressive osseous lesion. There is a T1 and T2 hyperintense lesion in the C3 vertebral body, likely a hemangioma. Cord: Normal signal and morphology. Posterior Fossa, vertebral arteries, paraspinal tissues: Negative. No abnormal enhancement. Disc levels: The craniocervical junction is unremarkable.There is no epidural or paraspinal fluid collection. C2-C3: No significant spinal canal or neural foraminal narrowing. C3-C4: Asymmetric right disc bulging with bilateral uncovertebral joint hypertrophy and mild bilateral facet arthropathy. There is no significant spinal canal stenosis. There is moderate right and mild left neural foraminal stenosis. C4-C5: Minimal disc bulging with bilateral uncovertebral joint hypertrophy and mild bilateral facet arthropathy. No significant spinal canal stenosis. There is severe right and moderate-severe left neural foraminal stenosis. C5-C6: Minimal disc bulging with bilateral uncovertebral joint hypertrophy and bilateral facet arthropathy. There is severe right and no significant left neural foraminal stenosis. C6-C7: Minimal disc bulging and bilateral uncovertebral joint hypertrophy with bilateral facet arthropathy. No significant spinal canal stenosis. There is moderate bilateral neural foraminal stenosis. C7-T1: No significant spinal canal or neural foraminal narrowing.  IMPRESSION: Multilevel degenerative changes of the cervical spine, worst from C3 through C7: C3-C4: Moderate right and mild left neural foraminal stenosis. No spinal canal stenosis. C4-C5: Severe right and moderate-severe left neural foraminal stenosis. No spinal canal stenosis. C5-C6: Severe right neural foraminal stenosis. No spinal canal or left neural foraminal stenosis. C6-C7: Moderate bilateral neural foraminal stenosis. No spinal canal stenosis. No evidence of discitis-osteomyelitis or epidural abscess. No abnormal cord signal. Electronically Signed   By: JMaurine SimmeringM.D.   On: 11/26/2022 14:56   MR BRAIN WO CONTRAST  Result Date: 11/25/2022 CLINICAL DATA:  Neuro deficit, acute, stroke suspected. Alcohol use disorder. Tremors. EXAM: MRI HEAD WITHOUT CONTRAST TECHNIQUE: Multiplanar, multiecho pulse sequences of the brain and surrounding structures were obtained without intravenous contrast. COMPARISON:  Head CT yesterday. FINDINGS: Brain: The study suffers from motion degradation. Diffusion imaging does not show any acute or subacute infarction or other cause of restricted diffusion. No focal abnormality affects the brainstem or cerebellum. Cerebral hemispheres are normal without  focal lesion. No evidence of mass lesion, hemorrhage, hydrocephalus or extra-axial collection. Vascular: Major vessels at the base of the brain show flow. Skull and upper cervical spine: Negative Sinuses/Orbits: Clear/normal Other: None IMPRESSION: Motion degraded study. No abnormality seen to explain the clinical presentation. Electronically Signed   By: Nelson Chimes M.D.   On: 11/25/2022 12:43   CT HEAD WO CONTRAST (5MM)  Result Date: 11/24/2022 CLINICAL DATA:  Altered mental status EXAM: CT HEAD WITHOUT CONTRAST TECHNIQUE: Contiguous axial images were obtained from the base of the skull through the vertex without intravenous contrast. RADIATION DOSE REDUCTION: This exam was performed according to the departmental  dose-optimization program which includes automated exposure control, adjustment of the mA and/or kV according to patient size and/or use of iterative reconstruction technique. COMPARISON:  CT Brain 03/28/21 FINDINGS: Brain: No evidence of acute infarction, hemorrhage, hydrocephalus, extra-axial collection or mass lesion/mass effect. Vascular: No hyperdense vessel or unexpected calcification. Skull: Normal. Negative for fracture or focal lesion. Sinuses/Orbits: No acute finding. Other: None. IMPRESSION: No acute intracranial abnormality Electronically Signed   By: Marin Roberts M.D.   On: 11/24/2022 14:41   DG Chest 2 View  Result Date: 11/24/2022 CLINICAL DATA:  Question sepsis EXAM: CHEST - 2 VIEW COMPARISON:  Chest 04/01/2018 FINDINGS: The heart size and mediastinal contours are within normal limits. Both lungs are clear. The visualized skeletal structures are unremarkable. IMPRESSION: No active cardiopulmonary disease. Electronically Signed   By: Franchot Gallo M.D.   On: 11/24/2022 12:40    Microbiology: Results for orders placed or performed during the hospital encounter of 11/24/22  Blood Culture (routine x 2)     Status: None   Collection Time: 11/24/22 12:13 PM   Specimen: BLOOD  Result Value Ref Range Status   Specimen Description BLOOD BLOOD LEFT ARM  Final   Special Requests   Final    BOTTLES DRAWN AEROBIC AND ANAEROBIC Blood Culture adequate volume   Culture   Final    NO GROWTH 5 DAYS Performed at [facility], [address], [place], [place] [address]    Report Status 11/29/2022 FINAL  Final  Blood Culture (routine x 2)     Status: None   Collection Time: 11/24/22  3:04 PM   Specimen: BLOOD  Result Value Ref Range Status   Specimen Description BLOOD RIGHT ANTECUBITAL  Final   Special Requests   Final    BOTTLES DRAWN AEROBIC AND ANAEROBIC Blood Culture adequate volume   Culture   Final    NO GROWTH 5 DAYS Performed at [facility], [address],  [place], [place] [address]    Report Status 11/29/2022 FINAL  Final  Resp Panel by RT-PCR (Flu A&B, Covid) Anterior Nasal Swab     Status: None   Collection Time: 11/24/22  3:04 PM   Specimen: Anterior Nasal Swab  Result Value Ref Range Status   SARS Coronavirus 2 by RT PCR NEGATIVE NEGATIVE Final    Comment: (NOTE) SARS-CoV-2 target nucleic acids are NOT DETECTED.  The SARS-CoV-2 RNA is generally detectable in upper respiratory specimens during the acute phase of infection. The lowest concentration of SARS-CoV-2 viral copies this assay can detect is 138 copies/mL. A negative result does not preclude SARS-Cov-2 infection and should not be used as the sole basis for treatment or other patient management decisions. A negative result may occur with  improper specimen collection/handling, submission of specimen other than nasopharyngeal swab, presence of viral mutation(s) within the areas targeted by  this assay, and inadequate number of viral copies(<138 copies/mL). A negative result must be combined with clinical observations, patient history, and epidemiological information. The expected result is Negative.  Fact Sheet for Patients:  EntrepreneurPulse.com.au  Fact Sheet for Healthcare Providers:  IncredibleEmployment.be  This test is no t yet approved or cleared by the [place] FDA and  has been authorized for detection and/or diagnosis of SARS-CoV-2 by FDA under an Emergency Use Authorization (EUA). This EUA will remain  in effect (meaning this test can be used) for the duration of the COVID-19 declaration under Section 564(b)(1) of the Act, 21 U.S.C.section 360bbb-3(b)(1), unless the authorization is terminated  or revoked sooner.       Influenza A by PCR NEGATIVE NEGATIVE Final   Influenza B by PCR NEGATIVE NEGATIVE Final    Comment: (NOTE) The Xpert Xpress SARS-CoV-2/FLU/RSV plus assay is intended as an aid in the diagnosis of influenza  from Nasopharyngeal swab specimens and should not be used as a sole basis for treatment. Nasal washings and aspirates are unacceptable for Xpert Xpress SARS-CoV-2/FLU/RSV testing.  Fact Sheet for Patients: EntrepreneurPulse.com.au  Fact Sheet for Healthcare Providers: IncredibleEmployment.be  This test is not yet approved or cleared by the [place] FDA and has been authorized for detection and/or diagnosis of SARS-CoV-2 by FDA under an Emergency Use Authorization (EUA). This EUA will remain in effect (meaning this test can be used) for the duration of the COVID-19 declaration under Section 564(b)(1) of the Act, 21 U.S.C. section 360bbb-3(b)(1), unless the authorization is terminated or revoked.  Performed at [facility], [address] [place], [place] [address]     Labs: CBC: Recent Labs  Lab 11/24/22 1213 11/25/22 0357 11/26/22 0609  WBC 10.1 6.8 7.2  NEUTROABS 7.7  --   --   HGB 14.1 12.4* 12.7*  HCT 41.7 37.1* 37.9*  MCV 85.8 88.3 89.0  PLT 315 232 542   Basic Metabolic Panel: Recent Labs  Lab 11/24/22 1213 11/24/22 1541 11/24/22 2000 11/25/22 0357 11/26/22 0609  NA 136  --   --  137 140  K 3.3*  --   --  3.7 4.1  CL 102  --   --  109 107  CO2 22  --   --  23 27  GLUCOSE 100*  --   --  93 83  BUN 9  --   --  9 10  CREATININE 0.98  --   --  0.92 0.96  CALCIUM 9.1  --   --  8.5* 8.5*  MG  --  1.9  --   --   --   PHOS  --   --  4.5  --   --    Liver Function Tests: Recent Labs  Lab 11/24/22 1213  AST 28  ALT 16  ALKPHOS 59  BILITOT 0.7  PROT 8.5*  ALBUMIN 4.5   CBG: No results for input(s): "GLUCAP" in the last 168 hours.  Discharge time spent: greater than 30 minutes.  This record has been created using Systems analyst. Errors have been sought and corrected,but may not always be located. Such creation errors do not reflect on the standard of care.   Signed: Lorella Nimrod,  MD [facility] 11/30/2022

## 2022-11-30 NOTE — Plan of Care (Signed)
  Problem: Education: Goal: Knowledge of General Education information will improve Description: Including pain rating scale, medication(s)/side effects and non-pharmacologic comfort measures Outcome: Adequate for Discharge   Problem: Health Behavior/Discharge Planning: Goal: Ability to manage health-related needs will improve Outcome: Adequate for Discharge   Problem: Clinical Measurements: Goal: Ability to maintain clinical measurements within normal limits will improve Outcome: Adequate for Discharge Goal: Will remain free from infection Outcome: Adequate for Discharge Goal: Diagnostic test results will improve Outcome: Adequate for Discharge Goal: Respiratory complications will improve Outcome: Adequate for Discharge Goal: Cardiovascular complication will be avoided Outcome: Adequate for Discharge   Problem: Activity: Goal: Risk for activity intolerance will decrease Outcome: Adequate for Discharge   Problem: Nutrition: Goal: Adequate nutrition will be maintained Outcome: Adequate for Discharge   Problem: Coping: Goal: Level of anxiety will decrease Outcome: Adequate for Discharge   Problem: Elimination: Goal: Will not experience complications related to bowel motility Outcome: Adequate for Discharge Goal: Will not experience complications related to urinary retention Outcome: Adequate for Discharge   Problem: Pain Managment: Goal: General experience of comfort will improve Outcome: Adequate for Discharge   Problem: Safety: Goal: Ability to remain free from injury will improve Outcome: Adequate for Discharge   Problem: Skin Integrity: Goal: Risk for impaired skin integrity will decrease Outcome: Adequate for Discharge   Problem: Acute Rehab PT Goals(only PT should resolve) Goal: Patient Will Transfer Sit To/From Stand Outcome: Adequate for Discharge Goal: Pt Will Ambulate Outcome: Adequate for Discharge Goal: Pt Will Go Up/Down Stairs Outcome: Adequate for  Discharge   Problem: Acute Rehab OT Goals (only OT should resolve) Goal: Pt. Will Perform Grooming Outcome: Adequate for Discharge Goal: Pt. Will Perform Lower Body Dressing Outcome: Adequate for Discharge Goal: Pt. Will Transfer To Toilet Outcome: Adequate for Discharge Goal: Pt. Will Perform Toileting-Clothing Manipulation Outcome: Adequate for Discharge

## 2022-12-05 LAB — ZINC: Zinc: 77 ug/dL (ref 44–115)

## 2022-12-18 ENCOUNTER — Emergency Department: Payer: Medicaid Other

## 2022-12-18 ENCOUNTER — Emergency Department
Admission: EM | Admit: 2022-12-18 | Discharge: 2022-12-18 | Disposition: A | Discharge status: Other Acute Inpatient Hospital | Payer: Medicaid Other | Attending: Emergency Medicine | Admitting: Emergency Medicine

## 2022-12-18 ENCOUNTER — Inpatient Hospital Stay: Admit: 2022-12-18 | Payer: Medicaid Other | Admitting: Pulmonary Disease

## 2022-12-18 ENCOUNTER — Encounter: Payer: Self-pay | Admitting: Radiology

## 2022-12-18 ENCOUNTER — Other Ambulatory Visit: Payer: Self-pay

## 2022-12-18 ENCOUNTER — Encounter (HOSPITAL_COMMUNITY): Payer: Self-pay

## 2022-12-18 DIAGNOSIS — R4189 Other symptoms and signs involving cognitive functions and awareness: Secondary | ICD-10-CM

## 2022-12-18 DIAGNOSIS — I10 Essential (primary) hypertension: Secondary | ICD-10-CM | POA: Diagnosis not present

## 2022-12-18 DIAGNOSIS — R569 Unspecified convulsions: Secondary | ICD-10-CM | POA: Insufficient documentation

## 2022-12-18 DIAGNOSIS — R464 Slowness and poor responsiveness: Secondary | ICD-10-CM | POA: Insufficient documentation

## 2022-12-18 DIAGNOSIS — E039 Hypothyroidism, unspecified: Secondary | ICD-10-CM | POA: Diagnosis not present

## 2022-12-18 DIAGNOSIS — G40409 Other generalized epilepsy and epileptic syndromes, not intractable, without status epilepticus: Secondary | ICD-10-CM | POA: Diagnosis not present

## 2022-12-18 DIAGNOSIS — R401 Stupor: Secondary | ICD-10-CM | POA: Diagnosis not present

## 2022-12-18 LAB — COMPREHENSIVE METABOLIC PANEL
ALT: 19 U/L (ref 0–44)
AST: 28 U/L (ref 15–41)
Albumin: 4.1 g/dL (ref 3.5–5.0)
Alkaline Phosphatase: 55 U/L (ref 38–126)
Anion gap: 8 (ref 5–15)
BUN: 8 mg/dL (ref 6–20)
CO2: 21 mmol/L — ABNORMAL LOW (ref 22–32)
Calcium: 9.2 mg/dL (ref 8.9–10.3)
Chloride: 112 mmol/L — ABNORMAL HIGH (ref 98–111)
Creatinine, Ser: 0.93 mg/dL (ref 0.61–1.24)
GFR, Estimated: >60 mL/min (ref >60)
Glucose, Bld: 100 mg/dL — ABNORMAL HIGH (ref 70–99)
Potassium: 3.6 mmol/L (ref 3.5–5.1)
Sodium: 141 mmol/L (ref 135–145)
Total Bilirubin: 0.9 mg/dL (ref 0.3–1.2)
Total Protein: 8 g/dL (ref 6.5–8.1)

## 2022-12-18 LAB — URINE DRUG SCREEN, QUALITATIVE (ARMC ONLY)
Amphetamines, Ur Screen: NOT DETECTED
Barbiturates, Ur Screen: NOT DETECTED
Benzodiazepine, Ur Scrn: POSITIVE — AB
Cannabinoid 50 Ng, Ur ~~LOC~~: NOT DETECTED
Cocaine Metabolite,Ur ~~LOC~~: NOT DETECTED
MDMA (Ecstasy)Ur Screen: NOT DETECTED
Methadone Scn, Ur: NOT DETECTED
Opiate, Ur Screen: NOT DETECTED
Phencyclidine (PCP) Ur S: NOT DETECTED
Tricyclic, Ur Screen: POSITIVE — AB

## 2022-12-18 LAB — URINALYSIS, ROUTINE W REFLEX MICROSCOPIC
Bacteria, UA: NONE SEEN
Bilirubin Urine: NEGATIVE
Glucose, UA: NEGATIVE mg/dL
Hgb urine dipstick: NEGATIVE
Ketones, ur: NEGATIVE mg/dL
Leukocytes,Ua: NEGATIVE
Nitrite: NEGATIVE
Protein, ur: NEGATIVE mg/dL
Specific Gravity, Urine: 1.01 (ref 1.005–1.030)
Squamous Epithelial / HPF: NONE SEEN (ref 0–5)
pH: 7 (ref 5.0–8.0)

## 2022-12-18 LAB — ETHANOL: Alcohol, Ethyl (B): <10 mg/dL (ref <10)

## 2022-12-18 LAB — CBC WITH DIFFERENTIAL/PLATELET
Abs Immature Granulocytes: 0.02 10*3/uL (ref 0.00–0.07)
Basophils Absolute: 0.1 10*3/uL (ref 0.0–0.1)
Basophils Relative: 1 %
Eosinophils Absolute: 0.1 10*3/uL (ref 0.0–0.5)
Eosinophils Relative: 1 %
HCT: 41.8 % (ref 39.0–52.0)
Hemoglobin: 14.2 g/dL (ref 13.0–17.0)
Immature Granulocytes: 0 %
Lymphocytes Relative: 15 %
Lymphs Abs: 1.3 10*3/uL (ref 0.7–4.0)
MCH: 29.9 pg (ref 26.0–34.0)
MCHC: 34 g/dL (ref 30.0–36.0)
MCV: 88 fL (ref 80.0–100.0)
Monocytes Absolute: 0.5 10*3/uL (ref 0.1–1.0)
Monocytes Relative: 6 %
Neutro Abs: 6.9 10*3/uL (ref 1.7–7.7)
Neutrophils Relative %: 77 %
Platelets: 239 10*3/uL (ref 150–400)
RBC: 4.75 MIL/uL (ref 4.22–5.81)
RDW: 13.3 % (ref 11.5–15.5)
WBC: 8.9 10*3/uL (ref 4.0–10.5)
nRBC: 0 % (ref 0.0–0.2)

## 2022-12-18 LAB — BLOOD GAS, VENOUS
Acid-base deficit: 0.6 mmol/L (ref 0.0–2.0)
Bicarbonate: 23.2 mmol/L (ref 20.0–28.0)
O2 Saturation: 97.8 %
Patient temperature: 37
pCO2, Ven: 35 mmHg — ABNORMAL LOW (ref 44–60)
pH, Ven: 7.43 (ref 7.25–7.43)
pO2, Ven: 82 mmHg — ABNORMAL HIGH (ref 32–45)

## 2022-12-18 LAB — TSH: TSH: 5.827 u[IU]/mL — ABNORMAL HIGH (ref 0.350–4.500)

## 2022-12-18 LAB — TROPONIN I (HIGH SENSITIVITY)
Troponin I (High Sensitivity): 4 ng/L (ref <18)
Troponin I (High Sensitivity): 3 ng/L (ref <18)

## 2022-12-18 LAB — ACETAMINOPHEN LEVEL: Acetaminophen (Tylenol), Serum: <10 ug/mL — ABNORMAL LOW (ref 10–30)

## 2022-12-18 LAB — T4, FREE: Free T4: 0.62 ng/dL (ref 0.61–1.12)

## 2022-12-18 LAB — SALICYLATE LEVEL: Salicylate Lvl: <7 mg/dL — ABNORMAL LOW (ref 7.0–30.0)

## 2022-12-18 MED ORDER — NALOXONE HCL 2 MG/2ML IJ SOSY
1.0000 mg | PREFILLED_SYRINGE | Freq: Once | INTRAMUSCULAR | Status: AC
  Administered 2022-12-18: 1 mg via INTRAVENOUS
  Filled 2022-12-18: qty 2

## 2022-12-18 MED ORDER — SODIUM CHLORIDE 0.9 % IV SOLN
2000.0000 mg | Freq: Once | INTRAVENOUS | Status: DC
  Filled 2022-12-18: qty 20

## 2022-12-18 MED ORDER — LEVETIRACETAM IN NACL 500 MG/100ML IV SOLN
500.0000 mg | Freq: Two times a day (BID) | INTRAVENOUS | Status: DC
  Filled 2022-12-18: qty 100

## 2022-12-18 MED ORDER — LEVETIRACETAM IN NACL 1000 MG/100ML IV SOLN
1000.0000 mg | Freq: Once | INTRAVENOUS | Status: AC
  Administered 2022-12-18: 1000 mg via INTRAVENOUS
  Filled 2022-12-18: qty 100

## 2022-12-18 MED ORDER — PROPOFOL 1000 MG/100ML IV EMUL
5.0000 ug/kg/min | INTRAVENOUS | Status: DC
  Administered 2022-12-18: 15 ug/kg/min via INTRAVENOUS
  Administered 2022-12-18: 50000 ug/kg/min via INTRAVENOUS
  Administered 2022-12-18: 5 ug/kg/min via INTRAVENOUS
  Filled 2022-12-18 (×2): qty 100

## 2022-12-18 MED ORDER — SUCCINYLCHOLINE CHLORIDE 200 MG/10ML IV SOSY
150.0000 mg | PREFILLED_SYRINGE | Freq: Once | INTRAVENOUS | Status: AC
  Administered 2022-12-18: 150 mg via INTRAVENOUS
  Filled 2022-12-18: qty 10

## 2022-12-18 MED ORDER — IOHEXOL 350 MG/ML SOLN
75.0000 mL | Freq: Once | INTRAVENOUS | Status: AC | PRN
  Administered 2022-12-18: 75 mL via INTRAVENOUS

## 2022-12-18 MED ORDER — ETOMIDATE 2 MG/ML IV SOLN
20.0000 mg | Freq: Once | INTRAVENOUS | Status: AC
  Administered 2022-12-18: 20 mg via INTRAVENOUS
  Filled 2022-12-18: qty 10

## 2022-12-18 MED ORDER — LEVETIRACETAM IN NACL 500 MG/100ML IV SOLN: 500.0000 mg | Freq: Two times a day (BID) | INTRAVENOUS | Status: DC

## 2022-12-18 NOTE — ED Notes (Signed)
Xray  [facility]  with  [facility] 

## 2022-12-18 NOTE — ED Notes (Signed)
Pt intubated by MD [place] Charon, 27 cm at the lip. + color change, bilateral breath sounds.

## 2022-12-18 NOTE — Progress Notes (Signed)
Eeg done 

## 2022-12-18 NOTE — ED Notes (Signed)
RN gave report over phone to Tobi Bastos, Charity fundraiser at [facility] and [place] with [employer]. Jesusita Oka states after pilot change they will call with an ETA.

## 2022-12-18 NOTE — ED Provider Notes (Signed)
[facility] Provider Note    None    (approximate)   History   Seizures   HPI  Curtis Cobb is a 51 y.o. male   Past medical history of alcohol use, depression, anxiety, hypertension, RA, substance abuse who presents to the emergency department with suspected seizure-like activity from his group home.  Per EMS report he was found down with seizure-like activity, they gave 5 of Versed prior to arrival in the emergency department.  He has some bleeding from his mouth.  He has been minimally responsive.    No reports of obvious trauma or preceding illness though reports limited from group home.  History was obtained via unable to obtain from patient given his unresponsiveness.  History obtained by EMS as above.  I also reviewed external medical notes including a discharge summary dated 11/30/2022 when he was initially seen in the emergency department and admitted for ataxia and tremors had extensive neurological workup while inpatient including negative brain MRI, negative spinal MRI, and found to have hypothyroid and was treated with improvement of ataxia and tremors.  He was ultimately discharged at the beginning of the month.      Physical Exam   Triage Vital Signs: ED Triage Vitals  Enc Vitals Group     BP      Pulse      Resp      Temp      Temp src      SpO2      Weight      Height      Head Circumference      Peak Flow      Pain Score      Pain Loc      Pain Edu?      Excl. in GC?     Most recent vital signs: Vitals:   12/18/22 1400 12/18/22 1430  BP: (!) 160/116 (!) 148/114  Pulse: 93 94  Resp: (!) 27 (!) 30  Temp: 97.9 F (36.6 C)   SpO2: 100% 97%    General: Unresponsive, maintaining airway, snoring, hemodynamics significant for mild tachycardia 106, blood pressure is 160/120, afebrile, not responding to noxious stimulus. CV:  Good peripheral perfusion.  Resp:  Maintaining airway, snoring loudly, lungs clear Abd:  No  distention.  No rigidity. Other:  No obvious head trauma.  He had bit his lip and there is some dried blood around his mouth.   ED Results / Procedures / Treatments   Labs (all labs ordered are listed, but only abnormal results are displayed) Labs Reviewed  COMPREHENSIVE METABOLIC PANEL - Abnormal; Notable for the following components:      Result Value   Chloride 112 (*)    CO2 21 (*)    Glucose, Bld 100 (*)    All other components within normal limits  TSH - Abnormal; Notable for the following components:   TSH 5.827 (*)    All other components within normal limits  BLOOD GAS, VENOUS - Abnormal; Notable for the following components:   pCO2, Ven 35 (*)    pO2, Ven 82 (*)    All other components within normal limits  SALICYLATE LEVEL - Abnormal; Notable for the following components:   Salicylate Lvl <7.0 (*)    All other components within normal limits  ACETAMINOPHEN LEVEL - Abnormal; Notable for the following components:   Acetaminophen (Tylenol), Serum <10 (*)    All other components within normal limits  ETHANOL  CBC  WITH DIFFERENTIAL/PLATELET  T4, FREE  URINE DRUG SCREEN, QUALITATIVE ([facility] ONLY)  URINALYSIS, ROUTINE W REFLEX MICROSCOPIC  TROPONIN I (HIGH SENSITIVITY)  TROPONIN I (HIGH SENSITIVITY)     I reviewed labs and they are notable for blood glucose is 100.  EKG  ED ECG REPORT I, Pilar Jarvis, the attending physician, personally viewed and interpreted this ECG.   Date: 12/18/2022  EKG Time: 1153  Rate: 104  Rhythm: sinus tachy  Intervals:right bundle branch block  ST&T Change: no acute ischemic changes    RADIOLOGY I independently reviewed and interpreted CT of the head and see no obvious bleeding or midline shift.   PROCEDURES:  Critical Care performed: Yes, see critical care procedure note(s)  .Critical Care  Performed by: Pilar Jarvis, MD Authorized by: Pilar Jarvis, MD   Critical care provider statement:    Critical care time (minutes):   30   Critical care was necessary to treat or prevent imminent or life-threatening deterioration of the following conditions:  CNS failure or compromise   Critical care was time spent personally by me on the following activities:  Development of treatment plan with patient or surrogate, discussions with consultants, evaluation of patient's response to treatment, examination of patient, ordering and review of laboratory studies, ordering and review of radiographic studies, ordering and performing treatments and interventions, pulse oximetry, re-evaluation of patient's condition and review of old charts Procedure Name: Intubation Date/Time: 12/18/2022 3:31 PM  Performed by: Pilar Jarvis, MDPre-anesthesia Checklist: Patient identified, Patient being monitored, Emergency Drugs available, Timeout performed and Suction available Oxygen Delivery Method: Non-rebreather mask Preoxygenation: Pre-oxygenation with 100% oxygen Induction Type: Rapid sequence Ventilation: Mask ventilation without difficulty Laryngoscope Size: Glidescope and 4 Tube size: 8.0 mm Airway Equipment and Method: Rigid stylet and Video-laryngoscopy Placement Confirmation: ETT inserted through vocal cords under direct vision, CO2 detector and Breath sounds checked- equal and bilateral Tube secured with: ETT holder Future Recommendations: Recommend- induction with short-acting agent, and alternative techniques readily available       MEDICATIONS ORDERED IN ED: Medications  levETIRAcetam (KEPPRA) IVPB 500 mg/100 mL premix (has no administration in time range)  propofol (DIPRIVAN) 1000 MG/100ML infusion (5 mcg/kg/min  90.7 kg Intravenous New Bag/Given 12/18/22 1531)  naloxone (NARCAN) injection 1 mg (1 mg Intravenous Given 12/18/22 1222)  levETIRAcetam (KEPPRA) IVPB 1000 mg/100 mL premix (0 mg Intravenous Stopped 12/18/22 1441)    Followed by  levETIRAcetam (KEPPRA) IVPB 1000 mg/100 mL premix (0 mg Intravenous Stopped 12/18/22  1425)  iohexol (OMNIPAQUE) 350 MG/ML injection 75 mL (75 mLs Intravenous Contrast Given 12/18/22 1348)  etomidate (AMIDATE) injection 20 mg (20 mg Intravenous Given 12/18/22 1524)  succinylcholine (ANECTINE) syringe 150 mg (150 mg Intravenous Given 12/18/22 1525)    Consultants:  I spoke with neurology consultant Dr. Bing Neighbors regarding care plan for this patient.   IMPRESSION / MDM / ASSESSMENT AND PLAN / ED COURSE  I reviewed the triage vital signs and the nursing notes.                              Differential diagnosis includes, but is not limited to, seizure, alcohol withdrawal, electrolyte derangements, intracranial bleeding, toxidrome, hypercapnic respiratory failure, infection   The patient is on the cardiac monitor to evaluate for evidence of arrhythmia and/or significant heart rate changes.  MDM: Patient who is responsive with reported seizure-like activity and history of alcohol use, suspected seizure perhaps postictal versus effects of medications Versed  given by EMS.  He is maintaining his airway right now with end-tidal reading on bedside monitor in the 80s, will check VBG.  Hold on intubation now given he is protecting his airway and hopefully will become more responsive after postictal period or effects of medications wear off.  Will require close monitoring in the meantime.  If he is hypercapnic to explain his altered mentation he will not tolerate noninvasive positive pressure ventilatory support in his current state so we will intubate in that situation.  Otherwise CT head is ordered along with blood work, toxicology screens.   Workup thus far largely unremarkable the patient continues to be unresponsive and maintaining his airway.  Tried IV Narcan with no response.   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  Dr. Selina Cooley evaluated this patient and ordered a CT angiogram rule out stroke as well as  Keppra.  Subsequently will require EEG and likely transfer to [facility]   The patient continues to be somnolent on arousable but maintaining airway, given that he will be transferred I will intubate the patient now for airway protection.  Patient's presentation is most consistent with acute presentation with potential threat to life or bodily function.       FINAL CLINICAL IMPRESSION(S) / ED DIAGNOSES   Final diagnoses:  Seizure-like activity (HCC)  Unresponsiveness     Rx / DC Orders   ED Discharge Orders     None        Note:  This document was prepared using Dragon voice recognition software and may include unintentional dictation errors.    Pilar Jarvis, MD 12/18/22 [phone]

## 2022-12-18 NOTE — ED Notes (Signed)
MD Modesto Charon at bedside and RT at bedside for intubation.

## 2022-12-18 NOTE — ED Provider Notes (Signed)
-----------------------------------------   5:27 PM on 12/18/2022 -----------------------------------------   I took over care of this patient from Dr. Modesto Charon.  I discussed case with the neuro ICU provider at [facility] and the patient was provisionally excepted, however at this time there are no ICU beds available.  I discussed the case with Dr. Selina Cooley from neurology.  Given that continued concern for nonconvulsive status epilepticus, she recommends reaching out to other facilities for neuro ICU admission to determine if there is availability elsewhere.    I contacted the [facility] transfer center and consulted Dr. Trisha Mangle from the neuro ICU; based on our discussion Dr. Trisha Mangle agreed to accept the patient.   The patient is stable for transfer at this time.  He remains on a propofol influsion and has received keppra.  He does not demonstrate any visible seizure activity at this time, and he is intubated for airway protection.    Dionne Bucy, MD 12/19/22 [phone]

## 2022-12-18 NOTE — ED Notes (Signed)
XRAY  POWERSHARE  WITH  [facility] 

## 2022-12-18 NOTE — ED Notes (Signed)
Per MD wong bolus 50mg  of propofol. Pt coughing during OG tube reinsertion.

## 2022-12-18 NOTE — Progress Notes (Addendum)
Neurology brief note  This is a 51 yo man admitted after first time seizure today at group home. Seizure semiology unknown but assumed to be GTC due to dried blood dripping from mouth. He has been persistently obtunded since arrival to ED, no response even to noxious stimuli. CT head and CTA H&N no acute findings to explain. He is afebrile with no leukocytosis. Spot EEG interpreted by me showed diffuse slowing 5-6 Hz with sleep architecture and no electrographic seizures. He required intubation for airway protection. No response to narcan. Given his persistent obtundation he will require admission to an ICU in a hospital capable of continuous EEG given concern for nonconvulsive seizures (20 min spot EEG does not rule this out). Full consult note to follow. Loaded on keppra 20mg /kg and started on 500mg  bid.  , MD [facility] [phone]  If 7pm- 7am, please page neurology on call as listed in AMION.

## 2022-12-18 NOTE — ED Notes (Signed)
EEG tech at bedside to place monitor on pt

## 2022-12-18 NOTE — ED Notes (Signed)
Emergency transfer to [facility] hospital neuro ICU. No family at bedside to obtain transfer consent.

## 2022-12-18 NOTE — ED Triage Notes (Signed)
Pt arrived via EMS for seizure. Pt was at a group home and was found on the floor. EMS states they were told pt had seizure like activity and posturing. Pt was given 5mg  of versed IM in route. Pt appears to have bit lip. Pt has snoring respirations and not responsive to painful stimuli. MD at bedside. Pt placed on CO2 monitor.

## 2022-12-18 NOTE — Procedures (Signed)
Routine EEG Report  Curtis Cobb is a 51 y.o. male with a history of seizure who is undergoing an EEG to evaluate for seizures.  Report: This EEG was acquired with electrodes placed according to the International 10-20 electrode system (including Fp1, Fp2, F3, F4, C3, C4, P3, P4, O1, O2, T3, T4, T5, T6, A1, A2, Fz, Cz, Pz). The following electrodes were missing or displaced: none.  The best background was 5-6 Hz. This activity is reactive to stimulation. There was no waking rhythm. Sleep was identified by K complexes and sleep spindles. There was no focal slowing. There were no interictal epileptiform discharges. There were no electrographic seizures identified. Photic stimulation and hyperventilation were not performed.  Impression and clinical correlation: This EEG was obtained while unresponsive and is abnormal due to mild diffuse slowing indicative of global cerebral dysfunction. Epileptiform abnormalities were not seen during this recording.  Bing Neighbors, MD [facility] [phone]  If 7pm- 7am, please page neurology on call as listed in AMION.

## 2022-12-18 NOTE — ED Notes (Signed)
[facility]  transfer  center  called  per  DR Marisa Severin  MD

## 2022-12-18 NOTE — ED Notes (Signed)
[facility]  called  per  Dr  Modesto Charon  MD

## 2022-12-18 NOTE — ED Notes (Signed)
OG tube adjusted, verified by auscultation. New chest xray order placed for xray verification.

## 2022-12-18 NOTE — Progress Notes (Signed)
SUBJECTIVE: Feeling much better.   Vitals:   12/18/22 1132 12/18/22 1136 12/18/22 1155 12/18/22 1200  BP:  (!) 162/129  (!) 149/112  Pulse:  (!) 106  (!) 103  Resp:  (!) 40  17  Temp:   97.8 F (36.6 C)   TempSrc:   Axillary   SpO2:  95%  94%  Weight: 90.7 kg     Height: 6\' 2"  (1.88 m)      No intake or output data in the 24 hours ending 12/18/22 1215  LABS: Basic Metabolic Panel: Recent Labs    12/18/22 1121  NA 141  K 3.6  CL 112*  CO2 21*  GLUCOSE 100*  BUN 8  CREATININE 0.93  CALCIUM 9.2   Liver Function Tests: Recent Labs    12/18/22 1121  AST 28  ALT 19  ALKPHOS 55  BILITOT 0.9  PROT 8.0  ALBUMIN 4.1   No results for input(s): "LIPASE", "AMYLASE" in the last 72 hours. CBC: Recent Labs    12/18/22 1121  WBC 8.9  NEUTROABS 6.9  HGB 14.2  HCT 41.8  MCV 88.0  PLT 239   Cardiac Enzymes: No results for input(s): "CKTOTAL", "CKMB", "CKMBINDEX", "TROPONINI" in the last 72 hours. BNP: Invalid input(s): "POCBNP" D-Dimer: No results for input(s): "DDIMER" in the last 72 hours. Hemoglobin A1C: No results for input(s): "HGBA1C" in the last 72 hours. Fasting Lipid Panel: No results for input(s): "CHOL", "HDL", "LDLCALC", "TRIG", "CHOLHDL", "LDLDIRECT" in the last 72 hours. Thyroid Function Tests: No results for input(s): "TSH", "T4TOTAL", "T3FREE", "THYROIDAB" in the last 72 hours.  Invalid input(s): "FREET3" Anemia Panel: No results for input(s): "VITAMINB12", "FOLATE", "FERRITIN", "TIBC", "IRON", "RETICCTPCT" in the last 72 hours.   PHYSICAL EXAM General: Well developed, well nourished, in no acute distress HEENT:  Normocephalic and atramatic Neck:  No JVD.  Lungs: Clear bilaterally to auscultation and percussion. Heart: HRRR . Normal S1 and S2 without gallops or murmurs.  Abdomen: Bowel sounds are positive, abdomen soft and non-tender  Msk:  Back normal, normal gait. Normal strength and tone for age. Extremities: No clubbing, cyanosis or  edema.   Neuro: Alert and oriented X 3. Psych:  Good affect, responds appropriately  TELEMETRY: Sinus rhythm  ASSESSMENT AND PLAN: Patient presented with atrial fibrillation but currently in sinus rhythm feeling much better.  Active Problems:   * No active hospital problems. 12/20/22, MD, [facility] 12/18/2022 12:15 PM

## 2022-12-18 NOTE — Consult Note (Signed)
NEUROLOGY CONSULTATION NOTE   Date of service: December 18, 2022 Patient Name: Curtis Cobb MRN:  [id number] DOB:  24-Mar-1971 Reason for consult: seizure Requesting physician: Dr. Pilar Jarvis _ _ _   _ __   _ __ _ _  __ __   _ __   __ _  History of Present Illness   51 yo man with hx substance abuse, HTN, RA, hypothyroidism BIB EMS from group home after seizure. Hx obtained from chart review as patient is unable to provide hx. Per EMS he was found on the ground unresponsive with generalized seizure activity and tongue biting. He received 5 of versed prior to arrival in ED. He remained unresponsive to painful stimuli for several hours and was ultimately intubated for airway protection. Spot EEG showed moderate diffuse slowing 5-6 Hz without epileptiform abnormalities.  No known seizure history. CT head showed no acute process. CTA showed no LVO or hemodynamically-significant stenosis. CNS imaging personally reviewed  He was recently admitted for ataxia, gait instability, and tremulousness felt to be 2/2 severe hypothyroidism (see neurology consult note from 11/26/22). As of last month patient reported that he had been sober and clean for the past 18 mos (unable to provide history today).   ROS   UTA 2/2 mental status  Past History   I have reviewed the following:  Past Medical History:  Diagnosis Date   Alcohol use disorder, moderate, dependence (HCC) 02/13/2014   Anxiety    COVID-19 03/29/2021   Diagnosed in [facility] ED on 03/29/2021   Depression    Hypertension    Rheumatoid arthritis (HCC)    Substance abuse [facility])    Past Surgical History:  Procedure Laterality Date   INCISION AND DRAINAGE ABSCESS Right 08/25/2018   Procedure: INCISION AND DRAINAGE ABSCESS;  Surgeon: Christena Flake, MD;  Location: [facility] ORS;  Service: Orthopedics;  Laterality: Right;   INCISION AND DRAINAGE ABSCESS Right 08/30/2018   Procedure: INCISION AND DRAINAGE ABSCESS;  Surgeon: Christena Flake, MD;  Location:  [facility] ORS;  Service: Orthopedics;  Laterality: Right;   none     Family History  Problem Relation Age of Onset   Heart attack Mother    Stroke Mother    Hypertension Mother    Mental illness Mother        Manic Depressive Bipolar Disorder   Heart disease Mother    Other Father        unknown medical history   Hypertension Maternal Aunt    Mental illness Maternal Aunt    Hypertension Maternal Aunt    Mental illness Maternal Aunt    Hyperlipidemia Maternal Grandfather    Hypertension Maternal Grandfather    Mental illness Maternal Grandfather    Social History   Socioeconomic History   Marital status: Single    Spouse name: Not on file   Number of children: Not on file   Years of education: Not on file   Highest education level: Not on file  Occupational History   Not on file  Tobacco Use   Smoking status: Never   Smokeless tobacco: Never   Tobacco comments:    Patient is a resident at [facility] for alcohol abuse and previous drug abuse (2016). Patient was homeless prior to going to [facility].  Vaping Use   Vaping Use: Never used  Substance and Sexual Activity   Alcohol use: Not Currently    Comment: 1/2 gal of liquor daily, last use 06/27/2021   Drug use: Not Currently  Types: Amphetamines, Marijuana, Cocaine, Heroin, MDMA (Ecstacy), Codeine, Fentanyl, Benzodiazepines, "Crack" cocaine, Oxycodone, Methamphetamines    Comment: Patient denies recent drug use. last use 2016   Sexual activity: Not Currently    Birth control/protection: None  Other Topics Concern   Not on file  Social History Narrative   Not on file   Social Determinants of Health   Financial Resource Strain: Not on file  Food Insecurity: No Food Insecurity (11/25/2022)   Hunger Vital Sign    Worried About Running Out of Food in the Last Year: Never true    Ran Out of Food in the Last Year: Never true  Transportation Needs: No Transportation Needs (11/25/2022)   PRAPARE - Scientist, research (physical sciences) (Medical): No    Lack of Transportation (Non-Medical): No  Physical Activity: Not on file  Stress: Not on file  Social Connections: Not on file   Allergies  Allergen Reactions   Sulfa Antibiotics Other (See Comments)    Unknown reaction Occurred as a child    Medications   (Not in a hospital admission)     Current Facility-Administered Medications:    [START ON 12/19/2022] levETIRAcetam (KEPPRA) IVPB 500 mg/100 mL premix, 500 mg, Intravenous, Q12H, Pilar Jarvis, MD   propofol (DIPRIVAN) 1000 MG/100ML infusion, 5-80 mcg/kg/min, Intravenous, Continuous, Pilar Jarvis, MD, Last Rate: 8.16 mL/hr at 12/18/22 1732, 15 mcg/kg/min at 12/18/22 1732  Current Outpatient Medications:    AMINO ACIDS PO, Take 1 packet by mouth daily., Disp: , Rfl:    ARIPiprazole (ABILIFY) 2 MG tablet, Take 1 tablet (2 mg total) by mouth daily., Disp: 30 tablet, Rfl: 1   FLUoxetine (PROZAC) 20 MG capsule, Take 3 capsules (60 mg total) by mouth daily., Disp: 90 capsule, Rfl: 3   gabapentin (NEURONTIN) 100 MG capsule, Take 1 capsule (100 mg total) by mouth 3 (three) times daily., Disp: 90 capsule, Rfl: 2   hydroxychloroquine (PLAQUENIL) 200 MG tablet, Take 1 tablet (200 mg total) by mouth once daily. (Patient not taking: Reported on 11/24/2022), Disp: 30 tablet, Rfl: 1   ibuprofen (ADVIL) 200 MG tablet, Take 800 mg by mouth daily as needed., Disp: , Rfl:    Iron-Folic Acid-Vit B12 (IRON FORMULA PO), Take 1 capsule by mouth daily., Disp: , Rfl:    Levomefolate Glucosamine (METHYL-FOLATE PO), Take 1 capsule by mouth daily., Disp: , Rfl:    levothyroxine (SYNTHROID) 100 MCG tablet, Take 1 tablet (100 mcg total) by mouth daily before breakfast., Disp: 90 tablet, Rfl: 1   methotrexate (RHEUMATREX) 2.5 MG tablet, Take 6 tablets (15 mg total) by mouth once a week. (Caution chemotherapy. Protect from light). (Patient not taking: Reported on 11/24/2022), Disp: 24 tablet, Rfl: 1   Multiple Vitamins-Minerals (ZINC  PO), Take 1 capsule by mouth daily., Disp: , Rfl:    NON FORMULARY, Take 3 capsules by mouth daily. Magnesium, Disp: , Rfl:    propranolol ER (INDERAL LA) 60 MG 24 hr capsule, Take 1 capsule (60 mg total) by mouth daily., Disp: 30 capsule, Rfl: 2  Vitals   Vitals:   12/18/22 1600 12/18/22 1652 12/18/22 1700 12/18/22 1730  BP: (!) 142/107 (!) 128/98 (!) 127/99 (!) 124/94  Pulse: 87 84 84 86  Resp: 19 (!) 22 (!) 22 (!) 21  Temp: (!) 95.9 F (35.5 C) (!) 95.9 F (35.5 C) (!) 95.9 F (35.5 C) (!) 96.1 F (35.6 C)  TempSrc:      SpO2: 99% 99% 99% 99%  Weight:  Height:         Body mass index is 25.67 kg/m.  Physical Exam   Physical Exam Gen: obtunded, not arousable to noxious stimuli, does not follow commands HEENT: dried blood around mouth Resp: mildly tachypneic CV: RRR  Neuro: *MS: obtunded, not arousable to noxious stimuli, does not follow commands *Speech: nonverbal *CN: pupils 47mm equal sluggishly reactive, (+) oculocephalics, face symmetric at rest *Motor & sensory: minimal withdrawal x4 extrem in response to noxious stimuli *Coordination, gait: UTA  Labs   CBC:  Recent Labs  Lab 12/18/22 1121  WBC 8.9  NEUTROABS 6.9  HGB 14.2  HCT 41.8  MCV 88.0  PLT 239    Basic Metabolic Panel:  Lab Results  Component Value Date   NA 141 12/18/2022   K 3.6 12/18/2022   CO2 21 (L) 12/18/2022   GLUCOSE 100 (H) 12/18/2022   BUN 8 12/18/2022   CREATININE 0.93 12/18/2022   CALCIUM 9.2 12/18/2022   GFRNONAA >60 12/18/2022   GFRAA >60 01/28/2019   Lipid Panel:  Lab Results  Component Value Date   LDLCALC 89 03/31/2021   HgbA1c:  Lab Results  Component Value Date   HGBA1C 5.3 03/31/2021   Urine Drug Screen:     Component Value Date/Time   LABOPIA NONE DETECTED 12/18/2022 1527   LABOPIA NONE DETECTED 07/10/2015 1637   COCAINSCRNUR NONE DETECTED 12/18/2022 1527   LABBENZ POSITIVE (A) 12/18/2022 1527   LABBENZ NONE DETECTED 07/10/2015 1637   AMPHETMU  NONE DETECTED 12/18/2022 1527   AMPHETMU POSITIVE (A) 07/10/2015 1637   THCU NONE DETECTED 12/18/2022 1527   THCU NONE DETECTED 07/10/2015 1637   LABBARB NONE DETECTED 12/18/2022 1527   LABBARB NONE DETECTED 07/10/2015 1637    Alcohol Level     Component Value Date/Time   ETH <10 12/18/2022 1121     Impression   51 yo man with hx substance abuse, HTN, RA, hypothyroidism BIB EMS from group home after first time GTC of unclear etiology.  He has been persistently obtunded since arrival to ED, no response even to noxious stimuli. CT head and CTA H&N no acute findings to explain. He is afebrile with no leukocytosis. Spot EEG interpreted by me showed diffuse slowing 5-6 Hz with sleep architecture and no electrographic seizures. He required intubation for airway protection. No response to narcan. Given his persistent obtundation he will require admission to an ICU in a hospital capable of continuous EEG given concern for nonconvulsive seizures (20 min spot EEG does not rule this out).    Recommendations   - Keppra load 20 mg/kg f/b 500mg  bid - Seizure precautions - Patient has been accepted for transfer to [facility] for cEEG monitoring. Bed is available and he will be transported shortly. Further mgmt per [facility] neurology. Please page me with any questions prior to transfer ______________________________________________________________________   Thank you for the opportunity to take part in the care of this patient. If you have any further questions, please contact the neurology consultation attending.  Signed,  [facility], MD [facility] [phone]  If 7pm- 7am, please page neurology on call as listed in AMION.  **Any copied and pasted documentation in this note was written by me in another application not billed for and pasted by me into this document.

## 2022-12-18 NOTE — ED Notes (Signed)
Pt maintaining airway. Pt not responding to painful stiumuli. Pt having occasional jerking movements. Pt remains on CO2 and cardiac monitor, MD Modesto Charon at bedside.

## 2022-12-18 NOTE — ED Notes (Signed)
Neuro at bedside.

## 2022-12-18 NOTE — ED Notes (Signed)
MD wong notified of pt vital signs, snoring respirations and GCS 3.

## 2022-12-31 ENCOUNTER — Other Ambulatory Visit: Payer: Self-pay

## 2023-01-14 ENCOUNTER — Other Ambulatory Visit: Payer: Self-pay

## 2023-02-03 ENCOUNTER — Other Ambulatory Visit: Payer: Self-pay

## 2023-02-26 IMAGING — CT CT ABD-PELV W/ CM
2 of 5 series · 16 of 46 positions shown, 18 images · IV contrast (omnipaque)
Comparison: 03/25/2016

CLINICAL DATA: Abdominal pain with nausea and vomiting for 8 days

EXAM:
CT ABDOMEN AND PELVIS WITH CONTRAST
TECHNIQUE: Multidetector CT imaging of the abdomen and pelvis was performed
using the standard protocol following bolus administration of
intravenous contrast.
CONTRAST:  100mL OMNIPAQUE IOHEXOL 300 MG/ML  SOLN

[Series 2: axial st · axial · 0.81mm/px · z∈[-1122,-672]mm · 13 of 102 slices shown, 15 images]
[im 6/102  soft-tissue]
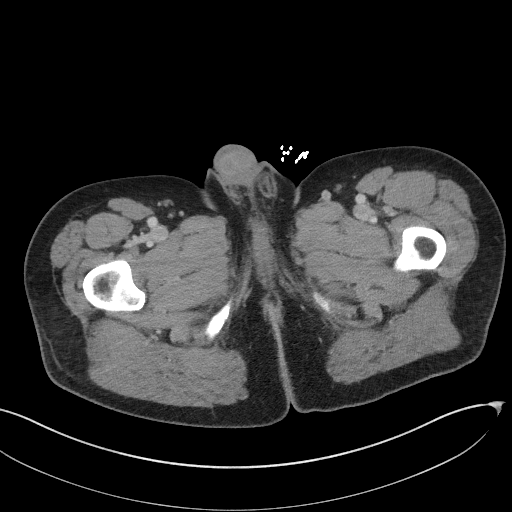
[im 6/102  bone]
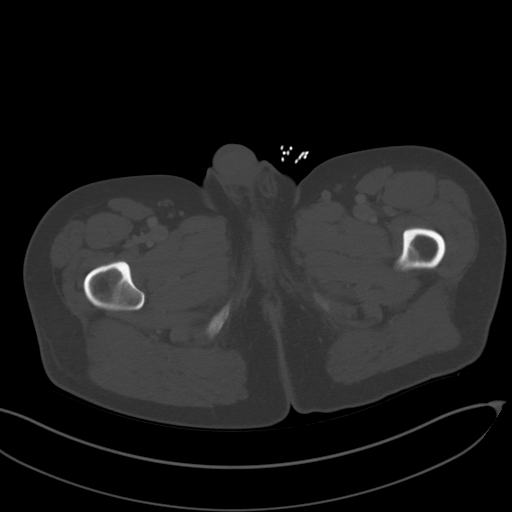
[im 12/102  soft-tissue]
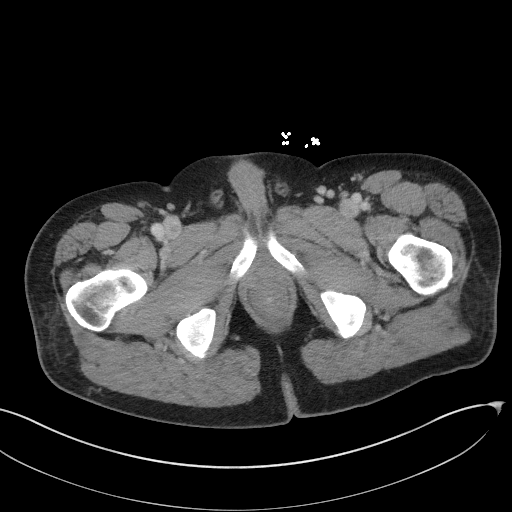
[im 23/102  soft-tissue]
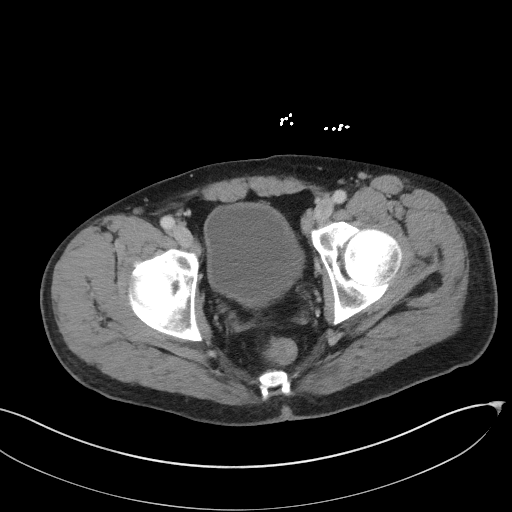
[im 29/102  soft-tissue]
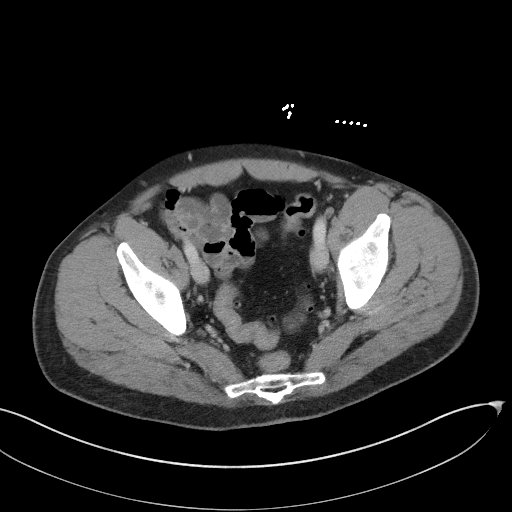
[im 34/102  soft-tissue]
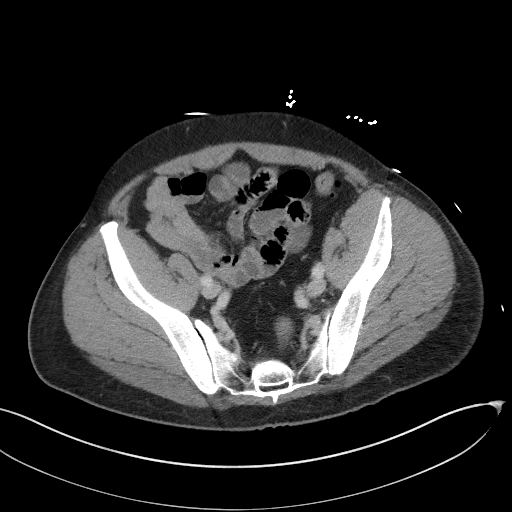
[im 45/102  soft-tissue]
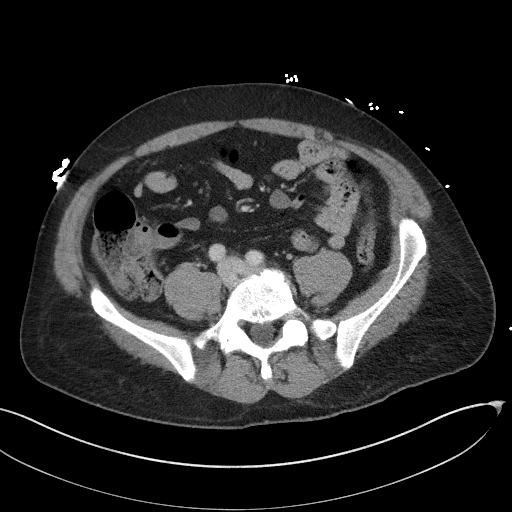
[im 51/102  soft-tissue]
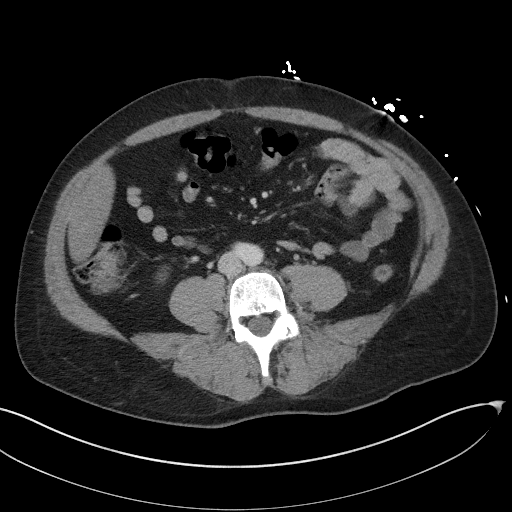
[im 57/102  soft-tissue]
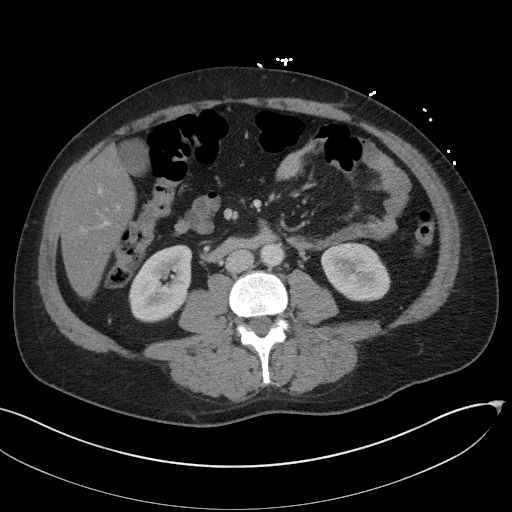
[im 68/102  soft-tissue]
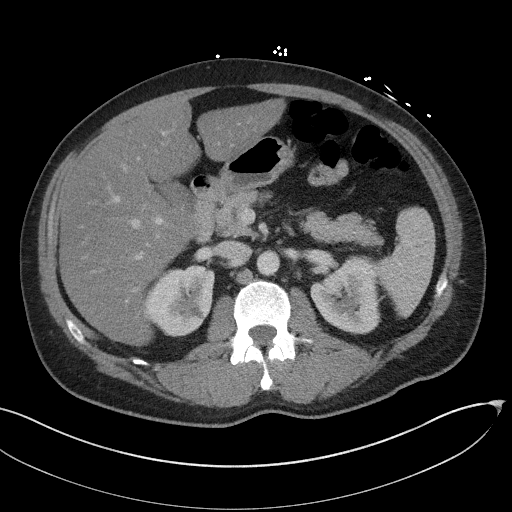
[im 68/102  bone]
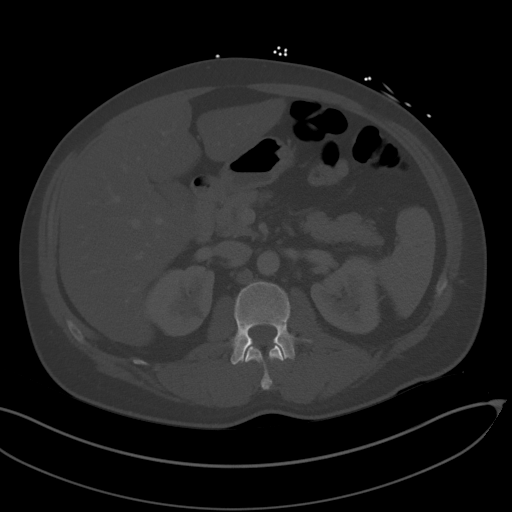
[im 73/102  soft-tissue]
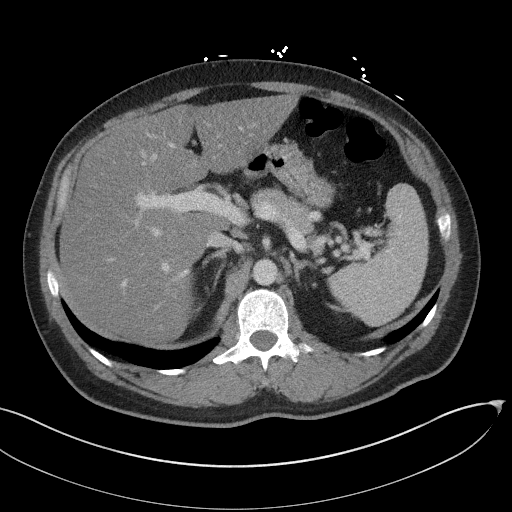
[im 79/102  soft-tissue]
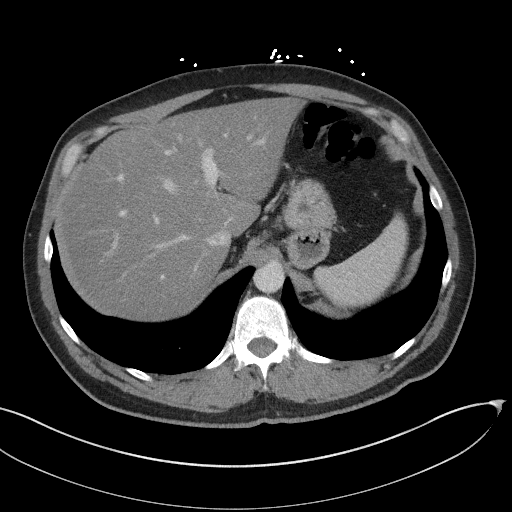
[im 90/102  soft-tissue]
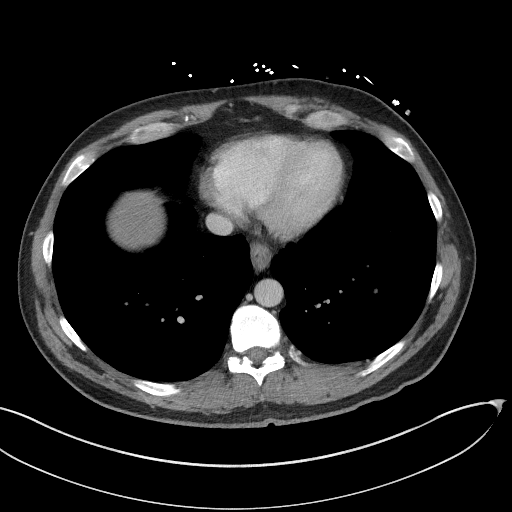
[im 96/102  soft-tissue]
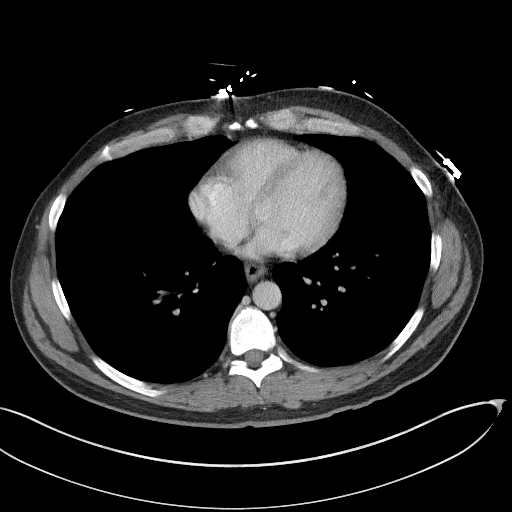

[Series 5: coronal st · coronal · 0.79mm/px · 3 of 94 slices shown]
[im 32/94  soft-tissue]
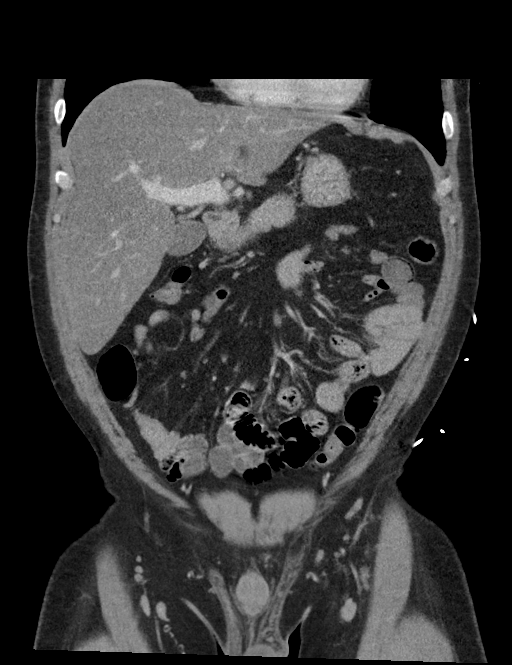
[im 42/94  soft-tissue]
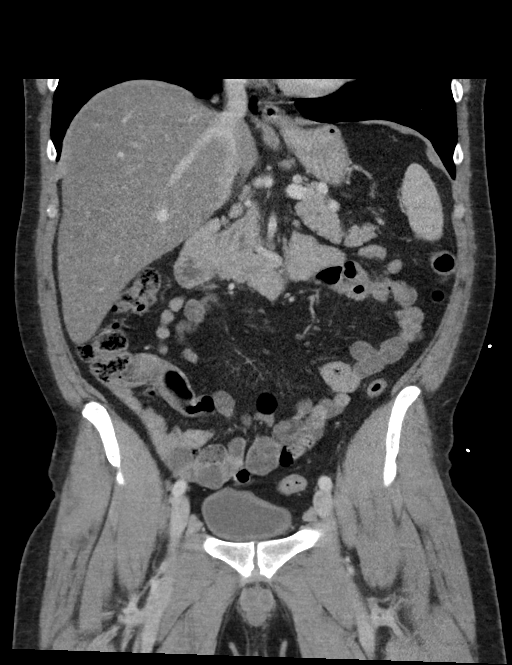
[im 52/94  soft-tissue]
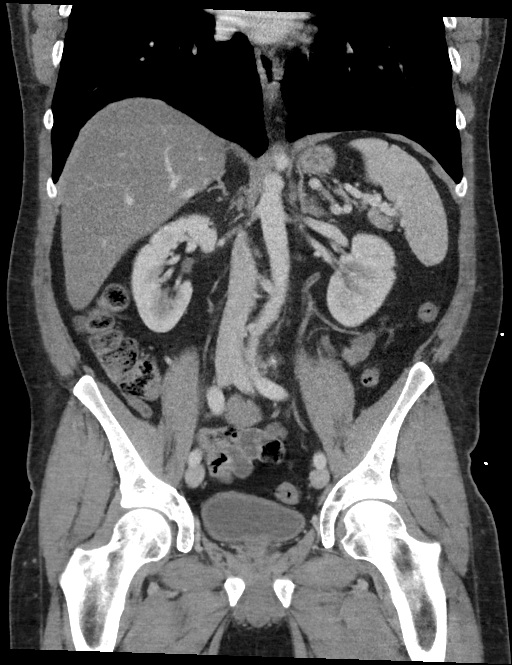

[16 of 46 positions shown; findings below may reference images not displayed]

FINDINGS: Lower chest: Small type 1 hiatal hernia. Adjacent 0.8 cm
paraesophageal lymph node on image 15 series 2, formerly 0.7 cm.

Hepatobiliary: Diffuse hepatic steatosis.  Otherwise unremarkable.

Pancreas: Borderline prominence of caliber in the dorsal pancreatic
duct in the pancreatic head and body but not in the pancreatic tail.
This is a similar appearance to the 03/25/2016 exam.

Spleen: Unremarkable

Adrenals/Urinary Tract: Small hypodense right renal lesions are
statistically likely to be small cysts although technically
nonspecific due to small size. Adrenal glands normal.

Stomach/Bowel: Small type 1 hiatal hernia. Sigmoid colon
diverticulosis.

Vascular/Lymphatic: Right external iliac node borderline prominent
at 1.0 cm in short axis on image 82 series 2, formerly the same.
Likewise there is a stable 1.0 cm left external iliac lymph node on
image 80 series 2.

Reproductive: Unremarkable

Other: No supplemental non-categorized findings.

Musculoskeletal: Old nonunited right eleventh rib fracture
posteriorly (although new compared to 03/25/2016). Lower lumbar
spondylosis and degenerative disc disease causing left foraminal
impingement at L4-5 and bilateral foraminal impingement at L5-S1.
IMPRESSION: 1. A specific cause for the patient's recent abdominal pain with
nausea and vomiting is not identified.
2. Other imaging findings of potential clinical significance: Small
type 1 hiatal hernia. Diffuse hepatic steatosis. Chronic borderline
prominence of the dorsal pancreatic duct in the pancreatic head and
body. Sigmoid colon diverticulosis. Impingement at L4-5 and L5-S1.

## 2023-06-01 NOTE — Progress Notes (Signed)
[facility] ° °PROGRESS NOTE ° °Patient:Curtis Cobb Male   DOB:07/30/1971     52 y.o.  MRN:[id number] ° °Visit Date: 12/10/2021 ° °HPI:  °Follow-up rheumatoid °Did not get Enbrel through that medication management °Down to 5 mg of prednisone.  Some fatigue.  Hands are stiff in the morning.  Knees are stiff °Off alcohol.  Remains at treatment center.  Active.  1 Plaquenil a day.  He has not had his eyes checked ° ° ° ° °Past Medical History:  °Diagnosis Date  ° Alcohol use disorder, moderate, dependence (HCC) 02/13/2014  ° Anxiety   ° COVID-19 03/29/2021  ° Diagnosed in [facility] ED on 03/29/2021  ° Depression   ° Hypertension   ° Rheumatoid arthritis (HCC)   ° Substance abuse (HCC)   ° ° °Past Surgical History:  °Procedure Laterality Date  ° INCISION AND DRAINAGE ABSCESS Right 08/25/2018  ° Procedure: INCISION AND DRAINAGE ABSCESS;  Surgeon: Poggi, John J, MD;  Location: [facility] ORS;  Service: Orthopedics;  Laterality: Right;  ° INCISION AND DRAINAGE ABSCESS Right 08/30/2018  ° Procedure: INCISION AND DRAINAGE ABSCESS;  Surgeon: Poggi, John J, MD;  Location: [facility] ORS;  Service: Orthopedics;  Laterality: Right;  ° none    ° ° °Social History  ° °Tobacco Use  ° Smoking status: Never  ° Smokeless tobacco: Never  ° Tobacco comments:  °  Patient is a resident at [facility] for alcohol abuse and previous drug abuse (2016). Patient was homeless prior to going to [facility].  °Substance Use Topics  ° Alcohol use: Not Currently  °  Comment: 1/2 gal of liquor daily, last use 06/27/2021  ° ° ° °MEDICATIONS: °Current Outpatient Medications  °Medication Sig Dispense Refill  ° methotrexate (RHEUMATREX) 2.5 MG tablet Take 6 tablets (15 mg total) by mouth once a week. Caution:Chemotherapy. Protect from light. 24 tablet 1  ° predniSONE (DELTASONE) 5 MG tablet Take 1 tablet (5 mg total) by mouth daily with breakfast. 30 tablet 1  ° amitriptyline (ELAVIL) 25 MG tablet TAKE ONE TABLET BY MOUTH ONCE DAILY AT BEDTIME. 30 tablet 2  °  busPIRone (BUSPAR) 15 MG tablet TAKE ONE TABLET BY MOUTH TWICE DAILY. 60 tablet 2  ° FLUoxetine (PROZAC) 40 MG capsule TAKE ONE CAPSULE BY MOUTH ONCE DAILY EVERY MORNING. 30 capsule 2  ° hydroxychloroquine (PLAQUENIL) 200 MG tablet Take 1 tablet (200 mg total) by mouth once daily. 30 tablet 1  ° °Current Facility-Administered Medications  °Medication Dose Route Frequency Provider Last Rate Last Admin  ° etanercept (ENBREL) 50 MG/ML injection 50 mg  50 mg Subcutaneous Once Kernodle, Myah Guynes W Jr., MD      ° ° ° °ALLERGIES °Allergies  °Allergen Reactions  ° Sulfa Antibiotics Other (See Comments)  °  Unknown reaction °Occurred as a child  ° ° ° °PHYSICAL EXAM: °There were no vitals taken for this visit. °Pleasant male.  Clear chest.   °Musculoskeletal: Good range of motion neck and shoulders.  Bilateral wrist synovitis.  MCP synovitis.  Reasonable grip.  To move well.  Small cool knee effusions ° ° °ASSESSMENT: °Rheumatoid arthritis, still some disease activity.  On prednisone 5 mg a day and Plaquenil.  Was unable to get overall patient assist °Polysubstance abuse but not under treatment °Prior alcohol hepatitis. ° ° ° °PLAN: °Recheck metabolic panel and CBC °If unremarkable then will start methotrexate 6 pills a week.  Folic acid 1 mg a day °Lower prednisone to 200 mg a day.    Same Plaquenil.  If he remains on Plaquenil we will have to arrange ophthalmologic evaluation °See back in 6 to 8 weeks.  We will draw lab monitoring at that time ° ° ° °G. Wallace Kernodle,Jr. MD           12/10/2021,  10:07 AM ° ° ° ° ° ° ° ° °  °

## 2023-08-19 ENCOUNTER — Other Ambulatory Visit: Payer: Self-pay

## 2023-10-21 ENCOUNTER — Ambulatory Visit: Payer: MEDICAID | Admitting: Family Medicine

## 2023-10-21 ENCOUNTER — Encounter: Payer: Self-pay | Admitting: Family Medicine

## 2023-10-21 VITALS — BP 120/80 | HR 107 | Temp 98.2°F | Ht 74.0 in | Wt 206.0 lb

## 2023-10-21 DIAGNOSIS — R000 Tachycardia, unspecified: Secondary | ICD-10-CM

## 2023-10-21 DIAGNOSIS — E039 Hypothyroidism, unspecified: Secondary | ICD-10-CM

## 2023-10-21 DIAGNOSIS — H9313 Tinnitus, bilateral: Secondary | ICD-10-CM | POA: Diagnosis not present

## 2023-10-21 DIAGNOSIS — R011 Cardiac murmur, unspecified: Secondary | ICD-10-CM

## 2023-10-21 DIAGNOSIS — Z23 Encounter for immunization: Secondary | ICD-10-CM | POA: Diagnosis not present

## 2023-10-21 DIAGNOSIS — Z0000 Encounter for general adult medical examination without abnormal findings: Secondary | ICD-10-CM | POA: Insufficient documentation

## 2023-10-21 DIAGNOSIS — Z1159 Encounter for screening for other viral diseases: Secondary | ICD-10-CM

## 2023-10-21 DIAGNOSIS — Z1211 Encounter for screening for malignant neoplasm of colon: Secondary | ICD-10-CM

## 2023-10-21 DIAGNOSIS — Z114 Encounter for screening for human immunodeficiency virus [HIV]: Secondary | ICD-10-CM

## 2023-10-21 DIAGNOSIS — Z0001 Encounter for general adult medical examination with abnormal findings: Secondary | ICD-10-CM

## 2023-10-21 MED ORDER — GABAPENTIN 300 MG PO CAPS
300.0000 mg | ORAL_CAPSULE | Freq: Three times a day (TID) | ORAL | 3 refills | Status: DC
Start: 2023-10-21 — End: 2024-02-21

## 2023-10-21 NOTE — Assessment & Plan Note (Signed)
Continue Synthroid until labs result. No symptoms reported. Will titrate as indicated.

## 2023-10-21 NOTE — Progress Notes (Addendum)
New Patient Office Visit  Subjective    Patient ID: Curtis Cobb, male    DOB: 1971-10-18  Age: 52 y.o. MRN: [id number]  CC:  Chief Complaint  Patient presents with   Establish Care    HPI Curtis Cobb presents to establish care. Oriented to practice routines and expectations. PMH includes RA, seizures, anxiety, tinnitus, insomnia, and hypothyroidism. He resumed an old rx of Synthroid daily approx 6 weeks ago. No recent labs but he was feeling fatigued, this has improved. He reports tinnitus since 2015 in both ears described as a "constant hiss" that is relieved by Gabapentin. Denies hearing loss, headaches, dizziness, or pulsating. He takes Gabapentin as well for restless leg of his arms and legs. He also endorses anxiety today regaring this visit, his anxiety is well controlled otherwise.  Colon CA screening: cologuard ordered Tobacco: non-smoker STI:  Vaccines:  TDaP today, declines flu and covid    Outpatient Encounter Medications as of 10/21/2023  Medication Sig   AMINO ACIDS PO Take 1 packet by mouth daily.   gabapentin (NEURONTIN) 300 MG capsule Take 1 capsule (300 mg total) by mouth 3 (three) times daily.   ibuprofen (ADVIL) 200 MG tablet Take 800 mg by mouth daily as needed.   Multiple Vitamins-Minerals (ZINC PO) Take 1 capsule by mouth daily.   NON FORMULARY Take 3 capsules by mouth daily. Magnesium   propranolol ER (INDERAL LA) 60 MG 24 hr capsule Take 1 capsule (60 mg total) by mouth daily.   [DISCONTINUED] gabapentin (NEURONTIN) 100 MG capsule Take 1 capsule (100 mg total) by mouth 3 (three) times daily.   [DISCONTINUED] levothyroxine (SYNTHROID) 100 MCG tablet Take 1 tablet (100 mcg total) by mouth daily before breakfast.   ARIPiprazole (ABILIFY) 2 MG tablet Take 1 tablet (2 mg total) by mouth daily. (Patient not taking: Reported on 10/21/2023)   FLUoxetine (PROZAC) 20 MG capsule Take 3 capsules (60 mg total) by mouth daily. (Patient not taking:  Reported on 10/21/2023)   hydroxychloroquine (PLAQUENIL) 200 MG tablet Take 1 tablet (200 mg total) by mouth once daily. (Patient not taking: Reported on 11/24/2022)   Iron-Folic Acid-Vit B12 (IRON FORMULA PO) Take 1 capsule by mouth daily. (Patient not taking: Reported on 10/21/2023)   Levomefolate Glucosamine (METHYL-FOLATE PO) Take 1 capsule by mouth daily. (Patient not taking: Reported on 10/21/2023)   methotrexate (RHEUMATREX) 2.5 MG tablet Take 6 tablets (15 mg total) by mouth once a week. (Caution chemotherapy. Protect from light). (Patient not taking: Reported on 11/24/2022)   No facility-administered encounter medications on file as of 10/21/2023.    Past Medical History:  Diagnosis Date   Alcohol use disorder, moderate, dependence (HCC) 02/13/2014   Anxiety    COVID-19 03/29/2021   Diagnosed in [facility] ED on 03/29/2021   Depression    Hypertension    Rheumatoid arthritis (HCC)    Substance abuse [facility])     Past Surgical History:  Procedure Laterality Date   INCISION AND DRAINAGE ABSCESS Right 08/25/2018   Procedure: INCISION AND DRAINAGE ABSCESS;  Surgeon: Christena Flake, MD;  Location: [facility] ORS;  Service: Orthopedics;  Laterality: Right;   INCISION AND DRAINAGE ABSCESS Right 08/30/2018   Procedure: INCISION AND DRAINAGE ABSCESS;  Surgeon: Christena Flake, MD;  Location: [facility] ORS;  Service: Orthopedics;  Laterality: Right;   none      Family History  Problem Relation Age of Onset   Heart attack Mother    Stroke Mother  Hypertension Mother    Mental illness Mother        Manic Depressive Bipolar Disorder   Heart disease Mother    Other Father        unknown medical history   Hypertension Maternal Aunt    Mental illness Maternal Aunt    Hypertension Maternal Aunt    Mental illness Maternal Aunt    Hyperlipidemia Maternal Grandfather    Hypertension Maternal Grandfather    Mental illness Maternal Grandfather     Social History   Socioeconomic History   Marital status:  Single    Spouse name: Not on file   Number of children: Not on file   Years of education: Not on file   Highest education level: Not on file  Occupational History   Not on file  Tobacco Use   Smoking status: Never   Smokeless tobacco: Never   Tobacco comments:    Patient is a resident at [facility] for alcohol abuse and previous drug abuse (2016). Patient was homeless prior to going to [facility].  Vaping Use   Vaping status: Never Used  Substance and Sexual Activity   Alcohol use: Not Currently    Comment: 1/2 gal of liquor daily, last use 06/27/2021   Drug use: Not Currently    Types: Amphetamines, Marijuana, Cocaine, Heroin, MDMA (Ecstacy), Codeine, Fentanyl, Benzodiazepines, "Crack" cocaine, Oxycodone, Methamphetamines    Comment: Patient denies recent drug use. last use 2016   Sexual activity: Not Currently    Birth control/protection: None  Other Topics Concern   Not on file  Social History Narrative   Not on file   Social Determinants of Health   Financial Resource Strain: Not on file  Food Insecurity: No Food Insecurity (11/25/2022)   Hunger Vital Sign    Worried About Running Out of Food in the Last Year: Never true    Ran Out of Food in the Last Year: Never true  Transportation Needs: No Transportation Needs (11/25/2022)   PRAPARE - Administrator, Civil Service (Medical): No    Lack of Transportation (Non-Medical): No  Physical Activity: Not on file  Stress: Not on file  Social Connections: Not on file  Intimate Partner Violence: Not At Risk (11/25/2022)   Humiliation, Afraid, Rape, and Kick questionnaire    Fear of Current or Ex-Partner: No    Emotionally Abused: No    Physically Abused: No    Sexually Abused: No    Review of Systems  Constitutional: Negative.   HENT:  Positive for tinnitus.   Eyes: Negative.   Respiratory: Negative.    Cardiovascular: Negative.   Gastrointestinal: Negative.   Genitourinary: Negative.   Musculoskeletal: Negative.    Skin: Negative.   Neurological: Negative.   Endo/Heme/Allergies: Negative.   Psychiatric/Behavioral:  The patient is nervous/anxious.   All other systems reviewed and are negative.       Objective    BP 120/80   Pulse (!) 107   Temp 98.2 F (36.8 C) (Oral)   Ht 6\' 2"  (1.88 m)   Wt 206 lb (93.4 kg)   SpO2 98%   BMI 26.45 kg/m   Physical Exam Vitals and nursing note reviewed.  Constitutional:      Appearance: Normal appearance. He is normal weight.  HENT:     Head: Normocephalic and atraumatic.     Right Ear: Tympanic membrane, ear canal and external ear normal.     Left Ear: Tympanic membrane, ear canal and external ear normal.  Nose: Nose normal.     Mouth/Throat:     Mouth: Mucous membranes are moist.     Pharynx: Oropharynx is clear.  Eyes:     Extraocular Movements: Extraocular movements intact.     Right eye: Normal extraocular motion and no nystagmus.     Left eye: Normal extraocular motion and no nystagmus.     Conjunctiva/sclera: Conjunctivae normal.     Pupils: Pupils are equal, round, and reactive to light.  Cardiovascular:     Rate and Rhythm: Normal rate and regular rhythm.     Pulses: Normal pulses.     Heart sounds: Murmur heard.  Pulmonary:     Effort: Pulmonary effort is normal.     Breath sounds: Normal breath sounds.  Abdominal:     General: Bowel sounds are normal.     Palpations: Abdomen is soft.  Genitourinary:    Comments: Deferred using shared decision making Musculoskeletal:        General: Normal range of motion.     Cervical back: Normal range of motion and neck supple.  Skin:    General: Skin is warm and dry.     Capillary Refill: Capillary refill takes less than 2 seconds.  Neurological:     General: No focal deficit present.     Mental Status: He is alert. Mental status is at baseline.  Psychiatric:        Mood and Affect: Mood is anxious.        Speech: Speech normal.        Behavior: Behavior normal.        Thought  Content: Thought content normal.        Cognition and Memory: Cognition and memory normal.        Judgment: Judgment normal.         Assessment & Plan:   Problem List Items Addressed This Visit     Hypothyroidism    Continue Synthroid until labs result. No symptoms reported. Will titrate as indicated.      Relevant Orders   Thyroid Panel With TSH (Completed)   Tinnitus of both ears    Chronic. Improved with Gabapentin. Bilateral without associated symptoms. Will refer to ENT.      Relevant Orders   Ambulatory referral to ENT   Murmur    No known history of murmur. Subtle murmur auscultated on exam. Will obtain ECHO.      Relevant Orders   ECHOCARDIOGRAM COMPLETE (Completed)   Physical exam, annual - Primary    Today your medical history was reviewed and routine physical exam with labs was performed. Recommend 150 minutes of moderate intensity exercise weekly and consuming a well-balanced diet. Advised to stop smoking if a smoker, avoid smoking if a non-smoker, limit alcohol consumption to 1 drink per day for women and 2 drinks per day for men, and avoid illicit drug use. Counseled on safe sex practices and offered STI testing today. Counseled on the importance of sunscreen use. Counseled in mental health awareness and when to seek medical care. Vaccine maintenance discussed. Appropriate health maintenance items reviewed. Return to office in 1 year for annual physical exam.       Relevant Orders   CBC with Differential/Platelet (Completed)   COMPLETE METABOLIC PANEL WITH GFR (Completed)   Lipid panel (Completed)   Thyroid Panel With TSH (Completed)   Other Visit Diagnoses     Tachycardia       Relevant Orders   EKG 12-Lead (Completed)  Screening for HIV (human immunodeficiency virus)       Relevant Orders   HIV Antibody (routine testing w rflx) (Completed)   Need for hepatitis C screening test       Relevant Orders   Hepatitis C antibody (Completed)   Colon  cancer screening       Relevant Orders   Cologuard   Need for vaccination       Relevant Orders   Tdap vaccine greater than or equal to 7yo IM (Completed)       Return in about 3 months (around 01/21/2024) for chronic follow-up with labs 1 week prior.   Park Meo, FNP

## 2023-10-21 NOTE — Assessment & Plan Note (Signed)
Chronic. Improved with Gabapentin. Bilateral without associated symptoms. Will refer to ENT.

## 2023-10-21 NOTE — Addendum Note (Signed)
Addended by: Arta Silence on: 10/21/2023 03:24 PM   Modules accepted: Orders

## 2023-10-21 NOTE — Assessment & Plan Note (Signed)

## 2023-10-21 NOTE — Patient Instructions (Signed)
It was great to meet you today and I'm excited to have you join the [facility] practice. I hope you had a positive experience today! If you feel so inclined, please feel free to recommend our practice to friends and family. Mila Merry, FNP-C

## 2023-10-21 NOTE — Assessment & Plan Note (Signed)
No known history of murmur. Subtle murmur auscultated on exam. Will obtain ECHO.

## 2023-10-22 LAB — THYROID PANEL WITH TSH
Free Thyroxine Index: 1.2 — ABNORMAL LOW (ref 1.4–3.8)
T3 Uptake: 25 % (ref 22–35)
T4, Total: 4.7 ug/dL — ABNORMAL LOW (ref 4.9–10.5)
TSH: 48.77 m[IU]/L — ABNORMAL HIGH (ref 0.40–4.50)

## 2023-10-22 LAB — LIPID PANEL
Cholesterol: 216 mg/dL — ABNORMAL HIGH (ref <200)
HDL: 38 mg/dL — ABNORMAL LOW (ref >=40)
LDL Cholesterol (Calc): 149 mg/dL — ABNORMAL HIGH
Non-HDL Cholesterol (Calc): 178 mg/dL — ABNORMAL HIGH (ref <130)
Total CHOL/HDL Ratio: 5.7 (calc) — ABNORMAL HIGH (ref <5.0)
Triglycerides: 157 mg/dL — ABNORMAL HIGH (ref <150)

## 2023-10-22 LAB — CBC WITH DIFFERENTIAL/PLATELET
Absolute Lymphocytes: 2030 {cells}/uL (ref 850–3900)
Absolute Monocytes: 687 {cells}/uL (ref 200–950)
Basophils Absolute: 71 {cells}/uL (ref 0–200)
Basophils Relative: 0.9 %
Eosinophils Absolute: 158 {cells}/uL (ref 15–500)
Eosinophils Relative: 2 %
HCT: 41.9 % (ref 38.5–50.0)
Hemoglobin: 13.9 g/dL (ref 13.2–17.1)
MCH: 29.3 pg (ref 27.0–33.0)
MCHC: 33.2 g/dL (ref 32.0–36.0)
MCV: 88.4 fL (ref 80.0–100.0)
MPV: 10.2 fL (ref 7.5–12.5)
Monocytes Relative: 8.7 %
Neutro Abs: 4953 {cells}/uL (ref 1500–7800)
Neutrophils Relative %: 62.7 %
Platelets: 337 10*3/uL (ref 140–400)
RBC: 4.74 10*6/uL (ref 4.20–5.80)
RDW: 12.8 % (ref 11.0–15.0)
Total Lymphocyte: 25.7 %
WBC: 7.9 10*3/uL (ref 3.8–10.8)

## 2023-10-22 LAB — COMPLETE METABOLIC PANEL WITH GFR
AG Ratio: 1.2 (calc) (ref 1.0–2.5)
ALT: 7 U/L — ABNORMAL LOW (ref 9–46)
AST: 19 U/L (ref 10–35)
Albumin: 4.5 g/dL (ref 3.6–5.1)
Alkaline phosphatase (APISO): 67 U/L (ref 35–144)
BUN: 9 mg/dL (ref 7–25)
CO2: 24 mmol/L (ref 20–32)
Calcium: 9.3 mg/dL (ref 8.6–10.3)
Chloride: 102 mmol/L (ref 98–110)
Creat: 0.95 mg/dL (ref 0.70–1.30)
Globulin: 3.7 g/dL (ref 1.9–3.7)
Glucose, Bld: 91 mg/dL (ref 65–99)
Potassium: 4.4 mmol/L (ref 3.5–5.3)
Sodium: 138 mmol/L (ref 135–146)
Total Bilirubin: 0.5 mg/dL (ref 0.2–1.2)
Total Protein: 8.2 g/dL — ABNORMAL HIGH (ref 6.1–8.1)
eGFR: 98 mL/min/{1.73_m2} (ref >=60)

## 2023-10-22 LAB — HIV ANTIBODY (ROUTINE TESTING W REFLEX): HIV 1&2 Ab, 4th Generation: NONREACTIVE

## 2023-10-22 LAB — HEPATITIS C ANTIBODY: Hepatitis C Ab: NONREACTIVE

## 2023-10-25 ENCOUNTER — Other Ambulatory Visit: Payer: Self-pay | Admitting: Family Medicine

## 2023-10-25 ENCOUNTER — Other Ambulatory Visit: Payer: Self-pay

## 2023-10-25 DIAGNOSIS — E039 Hypothyroidism, unspecified: Secondary | ICD-10-CM

## 2023-10-25 MED ORDER — LEVOTHYROXINE SODIUM 112 MCG PO TABS
112.0000 ug | ORAL_TABLET | Freq: Every day | ORAL | 1 refills | Status: DC
  Filled 2023-10-25: qty 60, 60d supply, fill #0

## 2023-11-05 ENCOUNTER — Encounter (INDEPENDENT_AMBULATORY_CARE_PROVIDER_SITE_OTHER): Payer: Self-pay | Admitting: Otolaryngology

## 2023-11-12 ENCOUNTER — Ambulatory Visit (HOSPITAL_COMMUNITY)
Admission: RE | Admit: 2023-11-12 | Discharge: 2023-11-12 | Disposition: A | Discharge status: Home / Self Care | Payer: MEDICAID | Source: Ambulatory Visit | Attending: Family Medicine | Admitting: Family Medicine

## 2023-11-12 DIAGNOSIS — R011 Cardiac murmur, unspecified: Secondary | ICD-10-CM | POA: Diagnosis present

## 2023-11-12 LAB — ECHOCARDIOGRAM COMPLETE
AR max vel: 3.93 cm2
AV Area VTI: 3.86 cm2
AV Area mean vel: 3.56 cm2
AV Mean grad: 2 mm[Hg]
AV Peak grad: 2.7 mm[Hg]
Ao pk vel: 0.82 m/s
Area-P 1/2: 2.66 cm2
Calc EF: 52.7 %
MV VTI: 4.3 cm2
S' Lateral: 3.2 cm
Single Plane A2C EF: 50.7 %
Single Plane A4C EF: 55.7 %

## 2023-11-17 LAB — COLOGUARD: COLOGUARD: NEGATIVE

## 2023-11-22 ENCOUNTER — Ambulatory Visit: Payer: Medicaid Other | Admitting: Internal Medicine

## 2023-12-02 ENCOUNTER — Encounter: Payer: Self-pay | Admitting: Internal Medicine

## 2023-12-06 ENCOUNTER — Ambulatory Visit (INDEPENDENT_AMBULATORY_CARE_PROVIDER_SITE_OTHER): Payer: MEDICAID | Admitting: Otolaryngology

## 2023-12-06 ENCOUNTER — Ambulatory Visit (INDEPENDENT_AMBULATORY_CARE_PROVIDER_SITE_OTHER): Payer: MEDICAID | Admitting: Audiology

## 2023-12-06 ENCOUNTER — Encounter (INDEPENDENT_AMBULATORY_CARE_PROVIDER_SITE_OTHER): Payer: Self-pay

## 2023-12-06 VITALS — Ht 73.0 in | Wt 206.0 lb

## 2023-12-06 DIAGNOSIS — H903 Sensorineural hearing loss, bilateral: Secondary | ICD-10-CM

## 2023-12-06 DIAGNOSIS — H9313 Tinnitus, bilateral: Secondary | ICD-10-CM | POA: Diagnosis not present

## 2023-12-06 NOTE — Progress Notes (Signed)
Dear Dr. Dimas Aguas, Here is my assessment for our mutual patient, Curtis Cobb. Thank you for allowing me the opportunity to care for your patient. Please do not hesitate to contact me should you have any other questions. Sincerely, Dr. Jovita Kussmaul  Otolaryngology Clinic Note Referring provider: Dr. Dimas Aguas HPI:  Curtis Cobb is a 52 y.o. male kindly referred by Dr. Dimas Aguas for evaluation of bilateral tinnitus.  Initial visit (11/2023): Patient reports: Bilateral tinnitus, ongoing since 2015. It's persistent and constant, he feels like it is high pitched. Notices it most in quiet room. Not pulsatile, sometimes associated with lightheadedness but not vertigo. No antecedent event. Has not noticed any subjective hearing loss. Some pressure change sensitivity (but mostly just with elevation changes). Tried white noise machine, did not help. He reports that sometimes, the gabapentin (300mg  TID) completely eliminates the tinnitus but this is variable Patient denies: ear pain, fullness, vertigo, drainage Patient additionally denies: deep pain in ear canal, eustachian tube symptoms such as popping, crackling Patient also denies barotrauma, vestibular suppressant use, ototoxic medication use Prior ear surgery: no, just ear infections as a child  No significant noise exposure Supplements: amino acids and magnesium  Does not take anything for RA currently  H&N Surgery: no Personal or FHx of bleeding dz or anesthesia difficulty: no  GLP-1: no AP/AC: no  Tobacco: no. Alcohol: prior Occupation: Best boy. Lives in [place], [place]  PMHx: RA, Seizures, Anxiety, prior HTN, RLS, Insomnia, Hypothyroidism  Independent Review of Additional Tests or Records:  Referral notes (Dr. Dimas Aguas 10/21/2023): Ongoing tinnitus since 2015, improved with gabapentin. "PMH includes RA, seizures, anxiety, tinnitus, insomnia, and hypothyroidism. He resumed an old rx of Synthroid daily approx 6 weeks ago. No recent  labs but he was feeling fatigued, this has improved. He reports tinnitus since 2015 in both ears described as a "constant hiss" that is relieved by Gabapentin. Denies hearing loss, headaches, dizziness, or pulsating. He takes Gabapentin as well for restless leg of his arms and legs. He also endorses anxiety today regaring this visit, his anxiety is well controlled otherwise.." Referred to ENT for further eval   CTH (11/2022): thick cuts, but clear mastoids and middle ear; no otic capsule abnormality noted MRI (10/2022): no retrocochlear lesions noted but not IAC   12/06/2023 audiogram was independently reviewed and interpreted by me and it reveals  AU: normal hearing thresholds with A/A tymps bilaterally except for for mild HF SNHL (8000 Hz); 92 and 96%% word interpretation at 60dB AD and AS respectively  SNHL= Sensorineural hearing loss'  PMH/Meds/All/SocHx/FamHx/ROS:   Past Medical History:  Diagnosis Date   Alcohol use disorder, moderate, dependence (HCC) 02/13/2014   Anxiety    COVID-19 03/29/2021   Diagnosed in [facility] ED on 03/29/2021   Depression    Hypertension    Rheumatoid arthritis (HCC)    Substance abuse [facility])      Past Surgical History:  Procedure Laterality Date   INCISION AND DRAINAGE ABSCESS Right 08/25/2018   Procedure: INCISION AND DRAINAGE ABSCESS;  Surgeon: Christena Flake, MD;  Location: [facility] ORS;  Service: Orthopedics;  Laterality: Right;   INCISION AND DRAINAGE ABSCESS Right 08/30/2018   Procedure: INCISION AND DRAINAGE ABSCESS;  Surgeon: Christena Flake, MD;  Location: [facility] ORS;  Service: Orthopedics;  Laterality: Right;   none      Family History  Problem Relation Age of Onset   Heart attack Mother    Stroke Mother    Hypertension Mother    Mental illness  Mother        Manic Depressive Bipolar Disorder   Heart disease Mother    Other Father        unknown medical history   Hypertension Maternal Aunt    Mental illness Maternal Aunt    Hypertension Maternal  Aunt    Mental illness Maternal Aunt    Hyperlipidemia Maternal Grandfather    Hypertension Maternal Grandfather    Mental illness Maternal Grandfather      Social Connections: Not on file      Current Outpatient Medications:    AMINO ACIDS PO, Take 1 packet by mouth daily., Disp: , Rfl:    gabapentin (NEURONTIN) 300 MG capsule, Take 1 capsule (300 mg total) by mouth 3 (three) times daily., Disp: 90 capsule, Rfl: 3   ibuprofen (ADVIL) 200 MG tablet, Take 800 mg by mouth daily as needed., Disp: , Rfl:    levothyroxine (SYNTHROID) 112 MCG tablet, Take 1 tablet (112 mcg total) by mouth daily before breakfast., Disp: 60 tablet, Rfl: 1   Multiple Vitamins-Minerals (ZINC PO), Take 1 capsule by mouth daily., Disp: , Rfl:    NON FORMULARY, Take 3 capsules by mouth daily. Magnesium, Disp: , Rfl:    ARIPiprazole (ABILIFY) 2 MG tablet, Take 1 tablet (2 mg total) by mouth daily. (Patient not taking: Reported on 10/21/2023), Disp: 30 tablet, Rfl: 1   FLUoxetine (PROZAC) 20 MG capsule, Take 3 capsules (60 mg total) by mouth daily. (Patient not taking: Reported on 10/21/2023), Disp: 90 capsule, Rfl: 3   hydroxychloroquine (PLAQUENIL) 200 MG tablet, Take 1 tablet (200 mg total) by mouth once daily. (Patient not taking: Reported on 11/24/2022), Disp: 30 tablet, Rfl: 1   Iron-Folic Acid-Vit B12 (IRON FORMULA PO), Take 1 capsule by mouth daily. (Patient not taking: Reported on 10/21/2023), Disp: , Rfl:    Levomefolate Glucosamine (METHYL-FOLATE PO), Take 1 capsule by mouth daily. (Patient not taking: Reported on 10/21/2023), Disp: , Rfl:    methotrexate (RHEUMATREX) 2.5 MG tablet, Take 6 tablets (15 mg total) by mouth once a week. (Caution chemotherapy. Protect from light). (Patient not taking: Reported on 11/24/2022), Disp: 24 tablet, Rfl: 1   propranolol ER (INDERAL LA) 60 MG 24 hr capsule, Take 1 capsule (60 mg total) by mouth daily. (Patient not taking: Reported on 12/06/2023), Disp: 30 capsule, Rfl: 2    Physical Exam:   Ht 6\' 1"  (1.854 m)   Wt 206 lb (93.4 kg)   BMI 27.18 kg/m   Salient findings:  CN II-XII intact  Bilateral EAC clear and TM intact with well pneumatized middle ear spaces Weber 512: midline Rinne 512: AC > BC b/l  Anterior rhinoscopy: Septum intact; no purulence noted on anterior rhino No lesions of oral cavity/oropharynx No obviously palpable neck masses/lymphadenopathy/thyromegaly No respiratory distress or stridor  Seprately Identifiable Procedures:  None today  Impression & Plans:  Latray Surrency is a 52 y.o. male with: 1. Bilateral tinnitus   2. Sensorineural hearing loss (SNHL) of both ears    Non-pulsatile, ongoing for approx 4 years; audio and prior imaging (though not dedicated to ears) is reassuring. We discussed tinnitus mgmt: masking, flavonoids, HA, therapy (UNCG) Masking has not worked but gabapentin is helping. Look at literature does not demonstrate strong evidence, and given side effects, do not think increasing dose is prudent.  Can consider flavonoids. Not interested in tinnitus retraining. He was reassured, and can try flavonoids Discussed return precautions, and repeat HT and will follow up PRN  See below  regarding exact medications prescribed this encounter including dosages and route: No orders of the defined types were placed in this encounter.     Thank you for allowing me the opportunity to care for your patient. Please do not hesitate to contact me should you have any other questions.  Sincerely, Jovita Kussmaul, MD Otolarynoglogist (ENT), [facility] Phone: [phone] Fax: [phone]  12/18/2023, 5:41 PM   MDM:  Level 4 Complexity/Problems addressed: mod - multple stable chronic problems (Hearing loss, tinnitus) Data complexity: mod - independent review of CT and MRI; notes review - Morbidity: low  - Prescription Drug prescribed or managed: no

## 2023-12-06 NOTE — Progress Notes (Signed)
  [address] [place], [place] [address] [phone]  Audiological Evaluation    Name: Curtis Cobb     DOB:   1971-03-16      MRN:   [id number]                                                                                     Service Date: 12/06/2023     Accompanied by: unaccompanied    Patient comes today after Dr. Allena Katz, ENT sent a referral for a hearing evaluation due to concerns with tinnitus.   Symptoms Yes Details  Hearing loss  []    Tinnitus  [x]  Both ears, comes and goes, may sometimes affect his sleep  and reports its onset to have been about 4 years ago  Ear pain/ Ear infections  []    Balance problems  [x]  Sometimes may feel lightheaded when he hears the tinnitus.  Noise exposure  []    Previous ear surgeries  []    Family history  [x]  Grandmother with age  Amplification  []    Other  []      Otoscopy: Right ear: Clear external ear canals and notable landmarks visualized on the tympanic membrane. Left ear:  Clear external ear canals and notable landmarks visualized on the tympanic membrane.  Tympanometry: Right ear: Type Ad- Normal external ear canal volume with normal middle ear pressure and high tympanic membrane compliance Left ear: Type A- Normal external ear canal volume with normal middle ear pressure and tympanic membrane compliance  Pure tone Audiometry: Normal hearing from [phone], then a mild presumably sensorineural hearing loss at 8000 Hz in both ears.   The hearing test results were completed under headphones and results are deemed to be of good reliability. Test technique:  conventional     Speech Audiometry: Right ear- Speech Reception Threshold (SRT) was obtained at 10 dBHL Left ear-Speech Reception Threshold (SRT) was obtained at 10 dBHL   Word Recognition Score Tested using NU-6 (MLV) Right ear: 92% was obtained at a presentation level of 60 dBHL with contralateral masking which is deemed as  excellent Left ear: 96%  was obtained at a presentation level of 60 dBHL with contralateral masking which is deemed as  excellent    Impression: There is not a significant difference in pure-tone thresholds between ears. There is not a significant difference in the word recognition score in between ears.    Recommendations: Follow up with ENT as scheduled for today. Return for a hearing evaluation if concerns with hearing changes arise or per MD recommendation. Consider various tinnitus strategies, including the use of a sound generator, or a fan.   Crescentia Boutwell MARIE LEROUX-MARTINEZ, AUD

## 2024-01-14 ENCOUNTER — Other Ambulatory Visit: Payer: MEDICAID

## 2024-01-14 DIAGNOSIS — E039 Hypothyroidism, unspecified: Secondary | ICD-10-CM

## 2024-01-14 DIAGNOSIS — I10 Essential (primary) hypertension: Secondary | ICD-10-CM

## 2024-01-14 DIAGNOSIS — F1020 Alcohol dependence, uncomplicated: Secondary | ICD-10-CM

## 2024-01-20 ENCOUNTER — Encounter: Payer: Self-pay | Admitting: Family Medicine

## 2024-01-20 ENCOUNTER — Ambulatory Visit (INDEPENDENT_AMBULATORY_CARE_PROVIDER_SITE_OTHER): Payer: MEDICAID | Admitting: Family Medicine

## 2024-01-20 VITALS — BP 136/82 | HR 75 | Temp 97.8°F | Ht 73.0 in | Wt 215.0 lb

## 2024-01-20 DIAGNOSIS — E782 Mixed hyperlipidemia: Secondary | ICD-10-CM | POA: Diagnosis not present

## 2024-01-20 DIAGNOSIS — E039 Hypothyroidism, unspecified: Secondary | ICD-10-CM

## 2024-01-20 NOTE — Assessment & Plan Note (Signed)
Uncontrolled with TSH 78.57. Continue Synthroid daily as tolerated. Report to office if unable to tolerate. Recheck in 6 weeks.

## 2024-01-20 NOTE — Progress Notes (Signed)
Subjective:  HPI: Curtis Cobb is a 53 y.o. male presenting on 01/20/2024 for Follow-up (3 months (around 01/21/2024) for chronic follow-up - JBG\\\)   HPI Patient is in today for hypothyroidism. TSH was 48.77 on 10/21/23 on daily synthroid. This was increased to daily and his current TSH is 78.57. He has only been taking this for 3 days after seeing his lab results. He discontinued the synthroid when he began to experience irritability. He then resumed this at night and is tolerating better with mild sleep interruptions. He is also inquiring the etiology of his hypothyroidism, does not know family history, no thyroid surgical or cancer history.  HYPOTHYROIDISM Thyroid control status:uncontrolled Satisfied with current treatment? no Medication side effects: yes Medication compliance: fair compliance Etiology of hypothyroidism:  Recent dose adjustment:yes Fatigue: yes Cold intolerance: no Heat intolerance: no Weight gain: no Weight loss: no Constipation: yes Diarrhea/loose stools: no Palpitations: no Lower extremity edema: no Anxiety/depressed mood: yes (irritability)  The 10-year ASCVD risk score (Arnett DK, et al., 2019) is: 5.4%   Values used to calculate the score:     Age: 9 years     Sex: Male     Is Non-Hispanic African American: No     Diabetic: No     Tobacco smoker: No     Systolic Blood Pressure: 136 mmHg     Is BP treated: No     HDL Cholesterol: 37 mg/dL     Total Cholesterol: 203 mg/dL    Review of Systems  All other systems reviewed and are negative.   Relevant past medical history reviewed and updated as indicated.   Past Medical History:  Diagnosis Date   Alcohol use disorder, moderate, dependence (HCC) 02/13/2014   Anxiety    COVID-19 03/29/2021   Diagnosed in [facility] ED on 03/29/2021   Depression    Hypertension    Rheumatoid arthritis (HCC)    Substance abuse [facility])      Past Surgical History:  Procedure Laterality Date    INCISION AND DRAINAGE ABSCESS Right 08/25/2018   Procedure: INCISION AND DRAINAGE ABSCESS;  Surgeon: Christena Flake, MD;  Location: [facility] ORS;  Service: Orthopedics;  Laterality: Right;   INCISION AND DRAINAGE ABSCESS Right 08/30/2018   Procedure: INCISION AND DRAINAGE ABSCESS;  Surgeon: Christena Flake, MD;  Location: [facility] ORS;  Service: Orthopedics;  Laterality: Right;   none      Allergies and medications reviewed and updated.   Current Outpatient Medications:    AMINO ACIDS PO, Take 1 packet by mouth daily., Disp: , Rfl:    gabapentin (NEURONTIN) 300 MG capsule, Take 1 capsule (300 mg total) by mouth 3 (three) times daily., Disp: 90 capsule, Rfl: 3   ibuprofen (ADVIL) 200 MG tablet, Take 800 mg by mouth daily as needed., Disp: , Rfl:    levothyroxine (SYNTHROID) 112 MCG tablet, Take 1 tablet (112 mcg total) by mouth daily before breakfast., Disp: 60 tablet, Rfl: 1   Multiple Vitamins-Minerals (ZINC PO), Take 1 capsule by mouth daily., Disp: , Rfl:    NON FORMULARY, Take 3 capsules by mouth daily. Magnesium, Disp: , Rfl:   Allergies  Allergen Reactions   Sulfa Antibiotics Other (See Comments)    Unknown reaction Occurred as a child    Objective:   BP 136/82   Pulse 75   Temp 97.8 F (36.6 C) (Oral)   Ht 6\' 1"  (1.854 m)   Wt 215 lb (97.5 kg)   SpO2 98%  BMI 28.37 kg/m      01/20/2024    2:01 PM 12/06/2023    8:56 AM 10/21/2023    2:13 PM  Vitals with BMI  Height 6\' 1"  6\' 1"  6\' 2"   Weight 215 lbs 206 lbs 206 lbs  BMI 28.37 27.18 26.44  Systolic 136  120  Diastolic 82  80  Pulse 75  107     Physical Exam Vitals and nursing note reviewed.  Constitutional:      Appearance: Normal appearance. He is normal weight.  HENT:     Head: Normocephalic and atraumatic.  Neck:     Thyroid: No thyromegaly.  Musculoskeletal:     Cervical back: Normal range of motion and neck supple.  Skin:    General: Skin is warm and dry.     Capillary Refill: Capillary refill takes less  than 2 seconds.  Neurological:     General: No focal deficit present.     Mental Status: He is alert and oriented to person, place, and time. Mental status is at baseline.  Psychiatric:        Mood and Affect: Mood normal.        Behavior: Behavior normal.        Thought Content: Thought content normal.        Judgment: Judgment normal.     Assessment & Plan:  Hypothyroidism, unspecified type Assessment & Plan: Uncontrolled with TSH 78.57. Continue Synthroid daily as tolerated. Report to office if unable to tolerate. Recheck in 6 weeks.   Orders: -     Thyroid Peroxidase Antibodies (TPO) (REFL) -     Thyroglobulin antibody  Moderate mixed hyperlipidemia not requiring statin therapy Assessment & Plan: I recommend consuming a heart healthy diet such as Mediterranean diet or DASH diet with whole grains, fruits, vegetable, fish, lean meats, nuts, and olive oil. Limit sweets and processed foods. I also encourage moderate intensity exercise 150 minutes weekly. This is 3-5 times weekly for 30-50 minutes each session. Goal should be pace of 3 miles/hours, or walking 1.5 miles in 30 minutes. The 10-year ASCVD risk score (Arnett DK, et al., 2019) is: 5.4%       Follow up plan: Return in about 7 weeks (around 03/09/2024) for chronic follow-up with labs 1 week prior.  Park Meo, FNP

## 2024-01-20 NOTE — Assessment & Plan Note (Signed)
I recommend consuming a heart healthy diet such as Mediterranean diet or DASH diet with whole grains, fruits, vegetable, fish, lean meats, nuts, and olive oil. Limit sweets and processed foods. I also encourage moderate intensity exercise 150 minutes weekly. This is 3-5 times weekly for 30-50 minutes each session. Goal should be pace of 3 miles/hours, or walking 1.5 miles in 30 minutes. The 10-year ASCVD risk score (Arnett DK, et al., 2019) is: 9.7%

## 2024-01-21 LAB — COMPREHENSIVE METABOLIC PANEL
AG Ratio: 1.5 (calc) (ref 1.0–2.5)
ALT: 7 U/L — ABNORMAL LOW (ref 9–46)
AST: 15 U/L (ref 10–35)
Albumin: 4.4 g/dL (ref 3.6–5.1)
Alkaline phosphatase (APISO): 53 U/L (ref 35–144)
BUN: 9 mg/dL (ref 7–25)
CO2: 27 mmol/L (ref 20–32)
Calcium: 8.9 mg/dL (ref 8.6–10.3)
Chloride: 103 mmol/L (ref 98–110)
Creat: 0.94 mg/dL (ref 0.70–1.30)
Globulin: 3 g/dL (ref 1.9–3.7)
Glucose, Bld: 91 mg/dL (ref 65–99)
Potassium: 4.3 mmol/L (ref 3.5–5.3)
Sodium: 140 mmol/L (ref 135–146)
Total Bilirubin: 0.4 mg/dL (ref 0.2–1.2)
Total Protein: 7.4 g/dL (ref 6.1–8.1)

## 2024-01-21 LAB — TEST AUTHORIZATION

## 2024-01-21 LAB — CBC
HCT: 42.3 % (ref 38.5–50.0)
Hemoglobin: 13.8 g/dL (ref 13.2–17.1)
MCH: 29.1 pg (ref 27.0–33.0)
MCHC: 32.6 g/dL (ref 32.0–36.0)
MCV: 89.2 fL (ref 80.0–100.0)
MPV: 9.9 fL (ref 7.5–12.5)
Platelets: 310 10*3/uL (ref 140–400)
RBC: 4.74 10*6/uL (ref 4.20–5.80)
RDW: 13.8 % (ref 11.0–15.0)
WBC: 6.9 10*3/uL (ref 3.8–10.8)

## 2024-01-21 LAB — T4, FREE: Free T4: 0.7 ng/dL — ABNORMAL LOW (ref 0.8–1.8)

## 2024-01-21 LAB — LIPID PANEL
Cholesterol: 203 mg/dL — ABNORMAL HIGH (ref <200)
HDL: 37 mg/dL — ABNORMAL LOW (ref >=40)
LDL Cholesterol (Calc): 140 mg/dL — ABNORMAL HIGH
Non-HDL Cholesterol (Calc): 166 mg/dL — ABNORMAL HIGH (ref <130)
Total CHOL/HDL Ratio: 5.5 (calc) — ABNORMAL HIGH (ref <5.0)
Triglycerides: 132 mg/dL (ref <150)

## 2024-01-21 LAB — TSH: TSH: 78.57 m[IU]/L — ABNORMAL HIGH (ref 0.40–4.50)

## 2024-01-24 LAB — THYROID PEROXIDASE ANTIBODIES (TPO) (REFL): Thyroperoxidase Ab SerPl-aCnc: 554 [IU]/mL — ABNORMAL HIGH (ref <9)

## 2024-01-24 LAB — THYROGLOBULIN ANTIBODY: Thyroglobulin Ab: >1000 [IU]/mL — ABNORMAL HIGH (ref <=1)

## 2024-01-26 ENCOUNTER — Encounter: Payer: Self-pay | Admitting: Family Medicine

## 2024-02-21 ENCOUNTER — Other Ambulatory Visit: Payer: Self-pay | Admitting: Family Medicine

## 2024-02-22 ENCOUNTER — Other Ambulatory Visit: Payer: Self-pay | Admitting: Family Medicine

## 2024-03-03 ENCOUNTER — Other Ambulatory Visit: Payer: MEDICAID

## 2024-03-06 ENCOUNTER — Other Ambulatory Visit: Payer: MEDICAID

## 2024-03-06 DIAGNOSIS — F1020 Alcohol dependence, uncomplicated: Secondary | ICD-10-CM

## 2024-03-06 DIAGNOSIS — I10 Essential (primary) hypertension: Secondary | ICD-10-CM

## 2024-03-06 DIAGNOSIS — E782 Mixed hyperlipidemia: Secondary | ICD-10-CM

## 2024-03-09 ENCOUNTER — Encounter: Payer: Self-pay | Admitting: Family Medicine

## 2024-03-09 ENCOUNTER — Other Ambulatory Visit: Payer: Self-pay

## 2024-03-09 ENCOUNTER — Ambulatory Visit (INDEPENDENT_AMBULATORY_CARE_PROVIDER_SITE_OTHER): Payer: MEDICAID | Admitting: Family Medicine

## 2024-03-09 VITALS — BP 122/78 | HR 98 | Temp 98.5°F | Ht 73.0 in | Wt 210.4 lb

## 2024-03-09 DIAGNOSIS — M069 Rheumatoid arthritis, unspecified: Secondary | ICD-10-CM | POA: Diagnosis not present

## 2024-03-09 DIAGNOSIS — E063 Autoimmune thyroiditis: Secondary | ICD-10-CM

## 2024-03-09 MED ORDER — MELOXICAM 7.5 MG PO TABS
7.5000 mg | ORAL_TABLET | Freq: Every day | ORAL | 0 refills | Status: DC
  Filled 2024-03-09: qty 30, 30d supply, fill #0

## 2024-03-09 MED ORDER — GABAPENTIN 300 MG PO CAPS
300.0000 mg | ORAL_CAPSULE | Freq: Three times a day (TID) | ORAL | 1 refills | Status: DC
  Filled 2024-03-27: qty 270, 90d supply, fill #0

## 2024-03-09 MED ORDER — LEVOTHYROXINE SODIUM 125 MCG PO TABS
125.0000 ug | ORAL_TABLET | Freq: Every day | ORAL | 1 refills | Status: DC
  Filled 2024-03-09: qty 90, 90d supply, fill #0

## 2024-03-09 NOTE — Assessment & Plan Note (Signed)
 Increase Synthroid to daily and recheck in 6 weeks.

## 2024-03-09 NOTE — Assessment & Plan Note (Signed)
 Prescribed Meloxicam 7.5mg  daily PRN. Advised against other NSAID use and discussed risk of GI bleed.

## 2024-03-09 NOTE — Progress Notes (Signed)
 Subjective:  HPI: Curtis Cobb is a 53 y.o. male presenting on 03/09/2024 for Medical Management of Chronic Issues   HPI Patient is in today for hypothyroidism. His TSH remains elevated. He is taking his Synthroid with less side effects. Is curious about contributors to his symptoms prior to his diagnosis of hypothyroidism. He also endorses today bouts of RA flare ups and requests Meloxicam and steroid taper pack to avoid ED visits for PRN use and flare ups. Discussed red flags and when he would need to seek medical care such as for swelling, redness, and warmth. Also counseled on use of NSAIDs and risks.   HYPOTHYROIDISM Thyroid control status:uncontrolled Satisfied with current treatment? no Medication side effects: yes Medication compliance: excellent compliance Etiology of hypothyroidism: Hastimoto's Recent dose adjustment:yes Fatigue: yes Cold intolerance: no Heat intolerance: no Weight gain: no Weight loss: no Constipation: no Diarrhea/loose stools: no Palpitations: no Lower extremity edema: no Anxiety/depressed mood: no  The 10-year ASCVD risk score (Arnett DK, et al., 2019) is: 4.5%   Values used to calculate the score:     Age: 70 years     Sex: Male     Is Non-Hispanic African American: No     Diabetic: No     Tobacco smoker: No     Systolic Blood Pressure: 122 mmHg     Is BP treated: No     HDL Cholesterol: 38 mg/dL     Total Cholesterol: 207 mg/dL   Review of Systems  All other systems reviewed and are negative.   Relevant past medical history reviewed and updated as indicated.   Past Medical History:  Diagnosis Date   Alcohol use disorder, moderate, dependence (HCC) 02/13/2014   Anxiety    COVID-19 03/29/2021   Diagnosed in [facility] ED on 03/29/2021   Depression    Hypertension    Rheumatoid arthritis (HCC)    Substance abuse [facility])      Past Surgical History:  Procedure Laterality Date   INCISION AND DRAINAGE ABSCESS Right 08/25/2018    Procedure: INCISION AND DRAINAGE ABSCESS;  Surgeon: Christena Flake, MD;  Location: [facility] ORS;  Service: Orthopedics;  Laterality: Right;   INCISION AND DRAINAGE ABSCESS Right 08/30/2018   Procedure: INCISION AND DRAINAGE ABSCESS;  Surgeon: Christena Flake, MD;  Location: [facility] ORS;  Service: Orthopedics;  Laterality: Right;   none      Allergies and medications reviewed and updated.   Current Outpatient Medications:    AMINO ACIDS PO, Take 1 packet by mouth daily., Disp: , Rfl:    ibuprofen (ADVIL) 200 MG tablet, Take 800 mg by mouth daily as needed., Disp: , Rfl:    meloxicam (MOBIC) 7.5 MG tablet, Take 1 tablet (7.5 mg total) by mouth daily., Disp: 30 tablet, Rfl: 0   Multiple Vitamins-Minerals (ZINC PO), Take 1 capsule by mouth daily., Disp: , Rfl:    NON FORMULARY, Take 3 capsules by mouth daily. Magnesium, Disp: , Rfl:    gabapentin (NEURONTIN) 300 MG capsule, Take 1 capsule (300 mg total) by mouth 3 (three) times daily., Disp: 270 capsule, Rfl: 1   levothyroxine (SYNTHROID) 125 MCG tablet, Take 1 tablet (125 mcg total) by mouth daily before breakfast., Disp: 90 tablet, Rfl: 1  Allergies  Allergen Reactions   Sulfa Antibiotics Other (See Comments)    Unknown reaction Occurred as a child    Objective:   BP 122/78   Pulse 98   Temp 98.5 F (36.9 C)   Ht 6'  1" (1.854 m)   Wt 210 lb 6 oz (95.4 kg)   SpO2 98%   BMI 27.76 kg/m      03/09/2024    2:00 PM 01/20/2024    2:01 PM 12/06/2023    8:56 AM  Vitals with BMI  Height 6\' 1"  6\' 1"  6\' 1"   Weight 210 lbs 6 oz 215 lbs 206 lbs  BMI 27.76 28.37 27.18  Systolic 122 136   Diastolic 78 82   Pulse 98 75      Physical Exam Vitals and nursing note reviewed.  Constitutional:      Appearance: Normal appearance. He is normal weight.  HENT:     Head: Normocephalic and atraumatic.  Cardiovascular:     Rate and Rhythm: Normal rate and regular rhythm.     Pulses: Normal pulses.     Heart sounds: Normal heart sounds.  Pulmonary:      Effort: Pulmonary effort is normal.     Breath sounds: Normal breath sounds.  Skin:    General: Skin is warm and dry.     Capillary Refill: Capillary refill takes less than 2 seconds.  Neurological:     General: No focal deficit present.     Mental Status: He is alert and oriented to person, place, and time. Mental status is at baseline.  Psychiatric:        Mood and Affect: Mood normal.        Behavior: Behavior normal.        Thought Content: Thought content normal.        Judgment: Judgment normal.     Assessment & Plan:  Hypothyroidism due to Hashimoto thyroiditis Assessment & Plan: Increase Synthroid to daily and recheck in 6 weeks.   Orders: -     TSH; Future  Rheumatoid arthritis, involving unspecified site, unspecified whether rheumatoid factor present [facility]) Assessment & Plan: Prescribed Meloxicam 7.5mg  daily PRN. Advised against other NSAID use and discussed risk of GI bleed.    Other orders -     Levothyroxine Sodium; Take 1 tablet (125 mcg total) by mouth daily before breakfast.  Dispense: 90 tablet; Refill: 1 -     Meloxicam; Take 1 tablet (7.5 mg total) by mouth daily.  Dispense: 30 tablet; Refill: 0 -     Gabapentin; Take 1 capsule (300 mg total) by mouth 3 (three) times daily.  Dispense: 270 capsule; Refill: 1     Follow up plan: Return in about 3 months (around 06/09/2024).  Park Meo, FNP

## 2024-03-10 LAB — CBC WITH DIFFERENTIAL/PLATELET
Absolute Lymphocytes: 2277 {cells}/uL (ref 850–3900)
Absolute Monocytes: 607 {cells}/uL (ref 200–950)
Basophils Absolute: 83 {cells}/uL (ref 0–200)
Basophils Relative: 1.2 %
Eosinophils Absolute: 380 {cells}/uL (ref 15–500)
Eosinophils Relative: 5.5 %
HCT: 42.8 % (ref 38.5–50.0)
Hemoglobin: 13.9 g/dL (ref 13.2–17.1)
MCH: 28.8 pg (ref 27.0–33.0)
MCHC: 32.5 g/dL (ref 32.0–36.0)
MCV: 88.6 fL (ref 80.0–100.0)
MPV: 10.6 fL (ref 7.5–12.5)
Monocytes Relative: 8.8 %
Neutro Abs: 3554 {cells}/uL (ref 1500–7800)
Neutrophils Relative %: 51.5 %
Platelets: 313 10*3/uL (ref 140–400)
RBC: 4.83 10*6/uL (ref 4.20–5.80)
RDW: 13 % (ref 11.0–15.0)
Total Lymphocyte: 33 %
WBC: 6.9 10*3/uL (ref 3.8–10.8)

## 2024-03-10 LAB — TEST AUTHORIZATION 2

## 2024-03-10 LAB — LIPID PANEL
Cholesterol: 207 mg/dL — ABNORMAL HIGH (ref <200)
HDL: 38 mg/dL — ABNORMAL LOW (ref >=40)
LDL Cholesterol (Calc): 142 mg/dL — ABNORMAL HIGH
Non-HDL Cholesterol (Calc): 169 mg/dL — ABNORMAL HIGH (ref <130)
Total CHOL/HDL Ratio: 5.4 (calc) — ABNORMAL HIGH (ref <5.0)
Triglycerides: 143 mg/dL (ref <150)

## 2024-03-10 LAB — COMPLETE METABOLIC PANEL WITH GFR
AG Ratio: 1.4 (calc) (ref 1.0–2.5)
ALT: 8 U/L — ABNORMAL LOW (ref 9–46)
AST: 16 U/L (ref 10–35)
Albumin: 4.6 g/dL (ref 3.6–5.1)
Alkaline phosphatase (APISO): 51 U/L (ref 35–144)
BUN: 13 mg/dL (ref 7–25)
CO2: 28 mmol/L (ref 20–32)
Calcium: 9.5 mg/dL (ref 8.6–10.3)
Chloride: 102 mmol/L (ref 98–110)
Creat: 1.04 mg/dL (ref 0.70–1.30)
Globulin: 3.2 g/dL (ref 1.9–3.7)
Glucose, Bld: 100 mg/dL — ABNORMAL HIGH (ref 65–99)
Potassium: 4.3 mmol/L (ref 3.5–5.3)
Sodium: 139 mmol/L (ref 135–146)
Total Bilirubin: 0.6 mg/dL (ref 0.2–1.2)
Total Protein: 7.8 g/dL (ref 6.1–8.1)
eGFR: 87 mL/min/{1.73_m2} (ref >=60)

## 2024-03-10 LAB — TEST AUTHORIZATION

## 2024-03-10 LAB — TSH: TSH: 47.93 m[IU]/L — ABNORMAL HIGH (ref 0.40–4.50)

## 2024-03-10 LAB — HEMOGLOBIN A1C
Hgb A1c MFr Bld: 5.1 %{Hb} (ref <5.7)
Mean Plasma Glucose: 100 mg/dL
eAG (mmol/L): 5.5 mmol/L

## 2024-03-13 ENCOUNTER — Other Ambulatory Visit: Payer: Self-pay | Admitting: Family Medicine

## 2024-03-13 ENCOUNTER — Other Ambulatory Visit: Payer: Self-pay

## 2024-03-13 MED ORDER — PREDNISONE 20 MG PO TABS
ORAL_TABLET | ORAL | 0 refills | Status: AC
  Filled 2024-03-13: qty 12, 6d supply, fill #0

## 2024-03-14 ENCOUNTER — Other Ambulatory Visit: Payer: Self-pay

## 2024-03-27 ENCOUNTER — Other Ambulatory Visit: Payer: Self-pay

## 2024-04-19 ENCOUNTER — Other Ambulatory Visit: Payer: MEDICAID

## 2024-04-19 DIAGNOSIS — E063 Autoimmune thyroiditis: Secondary | ICD-10-CM

## 2024-04-20 ENCOUNTER — Other Ambulatory Visit: Payer: Self-pay

## 2024-04-20 ENCOUNTER — Encounter: Payer: Self-pay | Admitting: Family Medicine

## 2024-04-20 ENCOUNTER — Other Ambulatory Visit: Payer: Self-pay | Admitting: Family Medicine

## 2024-04-20 LAB — TSH: TSH: 34.83 m[IU]/L — ABNORMAL HIGH (ref 0.40–4.50)

## 2024-04-20 MED ORDER — LEVOTHYROXINE SODIUM 137 MCG PO TABS
137.0000 ug | ORAL_TABLET | Freq: Every day | ORAL | 1 refills | Status: AC
Start: 2024-04-20 — End: ?
  Filled 2024-04-20: qty 60, 60d supply, fill #0

## 2024-06-14 ENCOUNTER — Other Ambulatory Visit: Payer: Self-pay

## 2024-06-14 ENCOUNTER — Other Ambulatory Visit (HOSPITAL_COMMUNITY): Payer: Self-pay

## 2024-06-14 ENCOUNTER — Telehealth: Payer: Self-pay

## 2024-06-14 ENCOUNTER — Encounter: Payer: Self-pay | Admitting: Family Medicine

## 2024-06-14 ENCOUNTER — Ambulatory Visit: Payer: MEDICAID | Admitting: Family Medicine

## 2024-06-14 VITALS — BP 136/89 | HR 77 | Temp 98.6°F | Ht 73.0 in | Wt 208.1 lb

## 2024-06-14 DIAGNOSIS — F331 Major depressive disorder, recurrent, moderate: Secondary | ICD-10-CM | POA: Diagnosis not present

## 2024-06-14 DIAGNOSIS — E063 Autoimmune thyroiditis: Secondary | ICD-10-CM

## 2024-06-14 MED ORDER — MELOXICAM 7.5 MG PO TABS
7.5000 mg | ORAL_TABLET | Freq: Every day | ORAL | 0 refills | Status: AC
  Filled 2024-06-14: qty 30, 30d supply, fill #0

## 2024-06-14 MED ORDER — GABAPENTIN 300 MG PO CAPS
300.0000 mg | ORAL_CAPSULE | Freq: Three times a day (TID) | ORAL | 1 refills | Status: DC
  Filled 2024-06-14 – 2024-07-18 (×2): qty 270, 90d supply, fill #0
  Filled 2024-10-24 – 2024-10-25 (×2): qty 270, 90d supply, fill #1

## 2024-06-14 MED ORDER — THYROID 60 MG PO TABS
60.0000 mg | ORAL_TABLET | Freq: Every day | ORAL | 1 refills | Status: DC
Start: 2024-06-14 — End: 2024-10-30
  Filled 2024-06-14: qty 90, 90d supply, fill #0
  Filled 2024-09-26: qty 90, 90d supply, fill #1

## 2024-06-14 MED ORDER — FLUOXETINE HCL 20 MG PO TABS
20.0000 mg | ORAL_TABLET | Freq: Every day | ORAL | 3 refills | Status: AC
  Filled 2024-06-14 – 2024-06-19 (×3): qty 90, 90d supply, fill #0
  Filled 2024-10-24 – 2024-10-25 (×2): qty 90, 90d supply, fill #1
  Filled 2024-12-04 – 2025-01-24 (×2): qty 90, 90d supply, fill #2

## 2024-06-14 NOTE — Progress Notes (Signed)
 Subjective:  HPI: Curtis Cobb is a 53 y.o. male presenting on 06/14/2024 for Medical Management of Chronic Issues (3 month f/u /Med refills and restart Prozac  20mg )   HPI Patient is in today for chronic condition follow up for hypothyroidism.  HYPOTHYROIDISM Thyroid  control status:{Blank single:19197::controlled,uncontrolled,better,worse,exacerbated,stable} Satisfied with current treatment? {Blank single:19197::yes,no} Medication side effects: {Blank single:19197::yes,no} Medication compliance: {Blank single:19197::excellent compliance,good compliance,fair compliance,poor compliance} Etiology of hypothyroidism:  Recent dose adjustment:{Blank single:19197::yes,no} Fatigue: {Blank single:19197::yes,no} Cold intolerance: {Blank single:19197::yes,no} Heat intolerance: {Blank single:19197::yes,no} Weight gain: {Blank single:19197::yes,no} Weight loss: {Blank single:19197::yes,no} Constipation: {Blank single:19197::yes,no} Diarrhea/loose stools: {Blank single:19197::yes,no} Palpitations: {Blank single:19197::yes,no} Lower extremity edema: {Blank single:19197::yes,no} Anxiety/depressed mood: {Blank single:19197::yes,no}   ROS  Relevant past medical history reviewed and updated as indicated.   Past Medical History:  Diagnosis Date   Alcohol  use disorder, moderate, dependence (HCC) 02/13/2014   Anxiety    COVID-19 03/29/2021   Diagnosed in [facility] ED on 03/29/2021   Depression    Hypertension    Rheumatoid arthritis (HCC)    Substance abuse [facility])      Past Surgical History:  Procedure Laterality Date   INCISION AND DRAINAGE ABSCESS Right 08/25/2018   Procedure: INCISION AND DRAINAGE ABSCESS;  Surgeon: Elner Hahn, MD;  Location: [facility] ORS;  Service: Orthopedics;  Laterality: Right;   INCISION AND DRAINAGE ABSCESS Right 08/30/2018   Procedure: INCISION AND DRAINAGE ABSCESS;  Surgeon: Elner Hahn, MD;   Location: [facility] ORS;  Service: Orthopedics;  Laterality: Right;   none      Allergies and medications reviewed and updated.   Current Outpatient Medications:    AMINO ACIDS PO, Take 1 packet by mouth daily., Disp: , Rfl:    FLUoxetine  (PROZAC ) 20 MG tablet, Take 1 tablet (20 mg total) by mouth daily., Disp: 90 tablet, Rfl: 3   ibuprofen  (ADVIL ) 200 MG tablet, Take 800 mg by mouth daily as needed., Disp: , Rfl:    Multiple Vitamins-Minerals (ZINC  PO), Take 1 capsule by mouth daily., Disp: , Rfl:    NON FORMULARY, Take 3 capsules by mouth daily. Magnesium , Disp: , Rfl:    thyroid  (ARMOUR THYROID ) 60 MG tablet, Take 1 tablet (60 mg total) by mouth daily before breakfast., Disp: 90 tablet, Rfl: 1   gabapentin  (NEURONTIN ) 300 MG capsule, Take 1 capsule (300 mg total) by mouth 3 (three) times daily., Disp: 270 capsule, Rfl: 1   meloxicam  (MOBIC ) 7.5 MG tablet, Take 1 tablet (7.5 mg total) by mouth daily., Disp: 30 tablet, Rfl: 0  Allergies  Allergen Reactions   Sulfa Antibiotics Other (See Comments)    Unknown reaction Occurred as a child    Objective:   BP 136/89   Pulse 77   Temp 98.6 F (37 C)   Ht 6' 1 (1.854 m)   SpO2 99%   BMI 27.76 kg/m      06/14/2024    1:57 PM 03/09/2024    2:00 PM 01/20/2024    2:01 PM  Vitals with BMI  Height 6' 1 6' 1 6' 1  Weight  210 lbs 6 oz 215 lbs  BMI  27.76 28.37  Systolic 136 122 811  Diastolic 89 78 82  Pulse 77 98 75     Physical Exam  Assessment & Plan:  Hypothyroidism due to Hashimoto thyroiditis -     TSH  Other orders -     Gabapentin ; Take 1 capsule (300 mg total) by mouth 3 (three) times daily.  Dispense: 270 capsule; Refill: 1 -  Meloxicam ; Take 1 tablet (7.5 mg total) by mouth daily.  Dispense: 30 tablet; Refill: 0 -     FLUoxetine  HCl; Take 1 tablet (20 mg total) by mouth daily.  Dispense: 90 tablet; Refill: 3 -     Thyroid ; Take 1 tablet (60 mg total) by mouth daily before breakfast.  Dispense: 90 tablet;  Refill: 1     Follow up plan: Return in about 6 weeks (around 07/26/2024).  Jenelle Mis, FNP

## 2024-06-14 NOTE — Telephone Encounter (Signed)
 Pharmacy Patient Advocate Encounter   Received notification from CoverMyMeds that prior authorization for FLUoxetine  HCl 20MG  tablets  is required/requested.   Insurance verification completed.   The patient is insured through [employer] .   Per test claim: PA required; PA submitted to above mentioned insurance via CoverMyMeds Key/confirmation #/EOC  (Key: [id number])     Status is pending

## 2024-06-15 ENCOUNTER — Other Ambulatory Visit (HOSPITAL_COMMUNITY): Payer: Self-pay

## 2024-06-15 ENCOUNTER — Encounter: Payer: Self-pay | Admitting: Family Medicine

## 2024-06-15 LAB — TSH: TSH: 41.93 m[IU]/L — ABNORMAL HIGH (ref 0.40–4.50)

## 2024-06-15 NOTE — Assessment & Plan Note (Signed)
 Noncompliant with Synthroid  due to side effects, would like to switch to Armour Thyroid  at lower dose. Discussed risks of uncontrolled thyroid  and he is aware that lowering dose could make him feel worse. Will discontinue Synthroid  125mcg daily and start Armour Thyroid  60mg  daily. TSH level drawn today. He would not like to see endocrinology, have consulted with Endo regarding management of this patient. Encouraged to return to office if symptoms worsen.

## 2024-06-15 NOTE — Telephone Encounter (Signed)
 Pharmacy Patient Advocate Encounter  Received notification from [facility] that Prior Authorization for FLUoxetine  HCl 20MG  tablets has been APPROVED from 6.19.25 to 6.19.26. Ran test claim, . This test claim was processed through [facility]- copay amounts may vary at other pharmacies due to pharmacy/plan contracts, or as the patient moves through the different stages of their insurance plan.   PA #/Case ID/Reference #: (Key: [id number])

## 2024-06-15 NOTE — Assessment & Plan Note (Signed)
 GAD 4 PHQ 7. Denies SI/HI. Start Prozac  20mg  daily. Stressed importance of lifestyle modifications including adequate sleep, proper nutrition, exercise, time outside, avoiding drugs and alcohol , and activities of interest. Return to office in 4-6 weeks.

## 2024-06-19 ENCOUNTER — Other Ambulatory Visit: Payer: Self-pay

## 2024-06-19 ENCOUNTER — Other Ambulatory Visit (HOSPITAL_COMMUNITY): Payer: Self-pay

## 2024-07-18 ENCOUNTER — Other Ambulatory Visit: Payer: Self-pay

## 2024-07-19 ENCOUNTER — Other Ambulatory Visit: Payer: MEDICAID

## 2024-07-19 DIAGNOSIS — R000 Tachycardia, unspecified: Secondary | ICD-10-CM

## 2024-07-19 DIAGNOSIS — F1020 Alcohol dependence, uncomplicated: Secondary | ICD-10-CM

## 2024-07-19 DIAGNOSIS — I10 Essential (primary) hypertension: Secondary | ICD-10-CM

## 2024-07-19 DIAGNOSIS — E782 Mixed hyperlipidemia: Secondary | ICD-10-CM

## 2024-07-19 DIAGNOSIS — E063 Autoimmune thyroiditis: Secondary | ICD-10-CM

## 2024-07-19 LAB — LIPID PANEL
Cholesterol: 188 mg/dL (ref <200)
HDL: 35 mg/dL — ABNORMAL LOW (ref >=40)
LDL Cholesterol (Calc): 123 mg/dL — ABNORMAL HIGH
Non-HDL Cholesterol (Calc): 153 mg/dL — ABNORMAL HIGH (ref <130)
Total CHOL/HDL Ratio: 5.4 (calc) — ABNORMAL HIGH (ref <5.0)
Triglycerides: 180 mg/dL — ABNORMAL HIGH (ref <150)

## 2024-07-19 LAB — COMPREHENSIVE METABOLIC PANEL WITH GFR
AG Ratio: 1.4 (calc) (ref 1.0–2.5)
ALT: 7 U/L — ABNORMAL LOW (ref 9–46)
AST: 17 U/L (ref 10–35)
Albumin: 4.4 g/dL (ref 3.6–5.1)
Alkaline phosphatase (APISO): 53 U/L (ref 35–144)
BUN: 12 mg/dL (ref 7–25)
CO2: 28 mmol/L (ref 20–32)
Calcium: 9.2 mg/dL (ref 8.6–10.3)
Chloride: 102 mmol/L (ref 98–110)
Creat: 1.03 mg/dL (ref 0.70–1.30)
Globulin: 3.1 g/dL (ref 1.9–3.7)
Glucose, Bld: 86 mg/dL (ref 65–99)
Potassium: 4.7 mmol/L (ref 3.5–5.3)
Sodium: 139 mmol/L (ref 135–146)
Total Bilirubin: 0.4 mg/dL (ref 0.2–1.2)
Total Protein: 7.5 g/dL (ref 6.1–8.1)
eGFR: 88 mL/min/1.73m2 (ref >=60)

## 2024-07-19 LAB — CBC WITH DIFFERENTIAL/PLATELET
Absolute Lymphocytes: 2797 {cells}/uL (ref 850–3900)
Absolute Monocytes: 679 {cells}/uL (ref 200–950)
Basophils Absolute: 47 {cells}/uL (ref 0–200)
Basophils Relative: 0.6 %
Eosinophils Absolute: 553 {cells}/uL — ABNORMAL HIGH (ref 15–500)
Eosinophils Relative: 7 %
HCT: 42 % (ref 38.5–50.0)
Hemoglobin: 13.6 g/dL (ref 13.2–17.1)
MCH: 29.3 pg (ref 27.0–33.0)
MCHC: 32.4 g/dL (ref 32.0–36.0)
MCV: 90.5 fL (ref 80.0–100.0)
MPV: 10.2 fL (ref 7.5–12.5)
Monocytes Relative: 8.6 %
Neutro Abs: 3824 {cells}/uL (ref 1500–7800)
Neutrophils Relative %: 48.4 %
Platelets: 295 Thousand/uL (ref 140–400)
RBC: 4.64 Million/uL (ref 4.20–5.80)
RDW: 13.5 % (ref 11.0–15.0)
Total Lymphocyte: 35.4 %
WBC: 7.9 Thousand/uL (ref 3.8–10.8)

## 2024-07-19 LAB — TSH: TSH: 107.34 m[IU]/L — ABNORMAL HIGH (ref 0.40–4.50)

## 2024-07-26 ENCOUNTER — Other Ambulatory Visit: Payer: Self-pay

## 2024-07-26 ENCOUNTER — Ambulatory Visit: Payer: MEDICAID | Admitting: Family Medicine

## 2024-07-26 ENCOUNTER — Encounter: Payer: Self-pay | Admitting: Family Medicine

## 2024-07-26 VITALS — BP 121/87 | HR 75 | Temp 97.7°F | Ht 73.0 in | Wt 205.6 lb

## 2024-07-26 DIAGNOSIS — E063 Autoimmune thyroiditis: Secondary | ICD-10-CM | POA: Diagnosis not present

## 2024-07-26 DIAGNOSIS — E782 Mixed hyperlipidemia: Secondary | ICD-10-CM

## 2024-07-26 MED ORDER — THYROID 15 MG PO TABS
15.0000 mg | ORAL_TABLET | Freq: Every day | ORAL | 1 refills | Status: DC
  Filled 2024-07-26 – 2024-10-24 (×2): qty 60, 60d supply, fill #0

## 2024-07-26 NOTE — Assessment & Plan Note (Signed)
 I recommend consuming a heart healthy diet such as Mediterranean diet or DASH diet with whole grains, fruits, vegetable, fish, lean meats, nuts, and olive oil. Limit sweets and processed foods. I also encourage moderate intensity exercise 150 minutes weekly. This is 3-5 times weekly for 30-50 minutes each session. Goal should be pace of 3 miles/hours, or walking 1.5 miles in 30 minutes. The 10-year ASCVD risk score (Arnett DK, et al., 2019) is: 4.2%

## 2024-07-26 NOTE — Progress Notes (Signed)
 Subjective:  HPI: Curtis Cobb is a 53 y.o. male presenting on 07/26/2024 for Medical Management of Chronic Issues (6 wk f/u )   HPI Patient is in today for follow up for hypothyroidism. He is tolerating the Armour Thyroid  60mg  daily and is compliant without side effects.   HYPOTHYROIDISM Thyroid  control status:uncontrolled Satisfied with current treatment? yes Medication side effects: no Medication compliance: excellent compliance Etiology of hypothyroidism:  Recent dose adjustment:yes Fatigue: yes Cold intolerance: no Heat intolerance: no Weight gain: no Weight loss: no Constipation: no Diarrhea/loose stools: no Palpitations: no Lower extremity edema: no Anxiety/depressed mood: no   The 10-year ASCVD risk score (Arnett DK, et al., 2019) is: 4.2%   Values used to calculate the score:     Age: 71 years     Clincally relevant sex: Male     Is Non-Hispanic African American: No     Diabetic: No     Tobacco smoker: No     Systolic Blood Pressure: 121 mmHg     Is BP treated: No     HDL Cholesterol: 35 mg/dL     Total Cholesterol: 188 mg/dL  .   Review of Systems  All other systems reviewed and are negative.   Relevant past medical history reviewed and updated as indicated.   Past Medical History:  Diagnosis Date   Alcohol  use disorder, moderate, dependence (HCC) 02/13/2014   Anxiety    COVID-19 03/29/2021   Diagnosed in [facility] ED on 03/29/2021   Depression    Hypertension    Rheumatoid arthritis (HCC)    Substance abuse [facility])      Past Surgical History:  Procedure Laterality Date   INCISION AND DRAINAGE ABSCESS Right 08/25/2018   Procedure: INCISION AND DRAINAGE ABSCESS;  Surgeon: Edie Norleen PARAS, MD;  Location: [facility] ORS;  Service: Orthopedics;  Laterality: Right;   INCISION AND DRAINAGE ABSCESS Right 08/30/2018   Procedure: INCISION AND DRAINAGE ABSCESS;  Surgeon: Edie Norleen PARAS, MD;  Location: [facility] ORS;  Service: Orthopedics;  Laterality: Right;   none       Allergies and medications reviewed and updated.   Current Outpatient Medications:    AMINO ACIDS PO, Take 1 packet by mouth daily., Disp: , Rfl:    FLUoxetine  (PROZAC ) 20 MG tablet, Take 1 tablet (20 mg total) by mouth daily., Disp: 90 tablet, Rfl: 3   gabapentin  (NEURONTIN ) 300 MG capsule, Take 1 capsule (300 mg total) by mouth 3 (three) times daily., Disp: 270 capsule, Rfl: 1   ibuprofen  (ADVIL ) 200 MG tablet, Take 800 mg by mouth daily as needed., Disp: , Rfl:    meloxicam  (MOBIC ) 7.5 MG tablet, Take 1 tablet (7.5 mg total) by mouth daily., Disp: 30 tablet, Rfl: 0   Multiple Vitamins-Minerals (ZINC  PO), Take 1 capsule by mouth daily., Disp: , Rfl:    NON FORMULARY, Take 3 capsules by mouth daily. Magnesium , Disp: , Rfl:    thyroid  (ARMOUR THYROID ) 60 MG tablet, Take 1 tablet (60 mg total) by mouth daily before breakfast., Disp: 90 tablet, Rfl: 1   thyroid  (ARMOUR) 15 MG tablet, Take 1 tablet (15 mg total) by mouth daily., Disp: 60 tablet, Rfl: 1  Allergies  Allergen Reactions   Sulfa Antibiotics Other (See Comments)    Unknown reaction Occurred as a child    Objective:   BP 121/87   Pulse 75   Temp 97.7 F (36.5 C)   Ht 6' 1 (1.854 m)   Wt 205 lb 9.6 oz (  93.3 kg)   SpO2 98%   BMI 27.13 kg/m      07/26/2024   11:15 AM 06/14/2024    1:57 PM 03/09/2024    2:00 PM  Vitals with BMI  Height 6' 1 6' 1 6' 1  Weight 205 lbs 10 oz 208 lbs 1 oz 210 lbs 6 oz  BMI 27.13 27.46 27.76  Systolic 121 136 877  Diastolic 87 89 78  Pulse 75 77 98     Physical Exam Vitals and nursing note reviewed.  Constitutional:      Appearance: Normal appearance. He is normal weight.  HENT:     Head: Normocephalic and atraumatic.  Cardiovascular:     Rate and Rhythm: Normal rate and regular rhythm.     Pulses: Normal pulses.     Heart sounds: Normal heart sounds.  Pulmonary:     Effort: Pulmonary effort is normal.     Breath sounds: Normal breath sounds.  Skin:    General: Skin is  warm and dry.     Capillary Refill: Capillary refill takes less than 2 seconds.  Neurological:     General: No focal deficit present.     Mental Status: He is alert and oriented to person, place, and time. Mental status is at baseline.  Psychiatric:        Mood and Affect: Mood normal.        Behavior: Behavior normal.        Thought Content: Thought content normal.        Judgment: Judgment normal.     Assessment & Plan:  Hypothyroidism due to Hashimoto thyroiditis Assessment & Plan: Increase Armour to 75mg  daily and recheck in 6 weeks. Euthyroid on exam.    Moderate mixed hyperlipidemia not requiring statin therapy Assessment & Plan: I recommend consuming a heart healthy diet such as Mediterranean diet or DASH diet with whole grains, fruits, vegetable, fish, lean meats, nuts, and olive oil. Limit sweets and processed foods. I also encourage moderate intensity exercise 150 minutes weekly. This is 3-5 times weekly for 30-50 minutes each session. Goal should be pace of 3 miles/hours, or walking 1.5 miles in 30 minutes. The 10-year ASCVD risk score (Arnett DK, et al., 2019) is: 4.2%    Other orders -     Thyroid ; Take 1 tablet (15 mg total) by mouth daily.  Dispense: 60 tablet; Refill: 1     Follow up plan: Return in about 3 months (around 10/26/2024) for chronic follow-up with labs 1 week prior.  Jeoffrey GORMAN Barrio, FNP

## 2024-07-26 NOTE — Assessment & Plan Note (Signed)
 Increase Armour to 75mg  daily and recheck in 6 weeks. Euthyroid on exam.

## 2024-08-07 ENCOUNTER — Other Ambulatory Visit: Payer: Self-pay

## 2024-09-06 ENCOUNTER — Other Ambulatory Visit: Payer: MEDICAID

## 2024-09-26 ENCOUNTER — Other Ambulatory Visit: Payer: Self-pay

## 2024-10-18 ENCOUNTER — Other Ambulatory Visit: Payer: MEDICAID

## 2024-10-18 LAB — CBC WITH DIFFERENTIAL/PLATELET
Absolute Lymphocytes: 1771 {cells}/uL (ref 850–3900)
Absolute Monocytes: 608 {cells}/uL (ref 200–950)
Basophils Absolute: 38 {cells}/uL (ref 0–200)
Basophils Relative: 0.5 %
Eosinophils Absolute: 441 {cells}/uL (ref 15–500)
Eosinophils Relative: 5.8 %
HCT: 44 % (ref 38.5–50.0)
Hemoglobin: 14.3 g/dL (ref 13.2–17.1)
MCH: 29.2 pg (ref 27.0–33.0)
MCHC: 32.5 g/dL (ref 32.0–36.0)
MCV: 90 fL (ref 80.0–100.0)
MPV: 10.5 fL (ref 7.5–12.5)
Monocytes Relative: 8 %
Neutro Abs: 4742 {cells}/uL (ref 1500–7800)
Neutrophils Relative %: 62.4 %
Platelets: 278 Thousand/uL (ref 140–400)
RBC: 4.89 Million/uL (ref 4.20–5.80)
RDW: 13.9 % (ref 11.0–15.0)
Total Lymphocyte: 23.3 %
WBC: 7.6 Thousand/uL (ref 3.8–10.8)

## 2024-10-18 LAB — COMPREHENSIVE METABOLIC PANEL WITH GFR
AG Ratio: 1.5 (calc) (ref 1.0–2.5)
ALT: 11 U/L (ref 9–46)
AST: 19 U/L (ref 10–35)
Albumin: 4.6 g/dL (ref 3.6–5.1)
Alkaline phosphatase (APISO): 56 U/L (ref 35–144)
BUN: 10 mg/dL (ref 7–25)
CO2: 27 mmol/L (ref 20–32)
Calcium: 9.5 mg/dL (ref 8.6–10.3)
Chloride: 102 mmol/L (ref 98–110)
Creat: 1.08 mg/dL (ref 0.70–1.30)
Globulin: 3 g/dL (ref 1.9–3.7)
Glucose, Bld: 80 mg/dL (ref 65–99)
Potassium: 4.8 mmol/L (ref 3.5–5.3)
Sodium: 139 mmol/L (ref 135–146)
Total Bilirubin: 0.7 mg/dL (ref 0.2–1.2)
Total Protein: 7.6 g/dL (ref 6.1–8.1)
eGFR: 83 mL/min/1.73m2 (ref >=60)

## 2024-10-18 LAB — LIPID PANEL
Cholesterol: 214 mg/dL — ABNORMAL HIGH (ref <200)
HDL: 27 mg/dL — ABNORMAL LOW (ref >=40)
LDL Cholesterol (Calc): 153 mg/dL — ABNORMAL HIGH
Non-HDL Cholesterol (Calc): 187 mg/dL — ABNORMAL HIGH (ref <130)
Total CHOL/HDL Ratio: 7.9 (calc) — ABNORMAL HIGH (ref <5.0)
Triglycerides: 196 mg/dL — ABNORMAL HIGH (ref <150)

## 2024-10-18 LAB — TSH: TSH: 41.77 m[IU]/L — ABNORMAL HIGH (ref 0.40–4.50)

## 2024-10-24 ENCOUNTER — Other Ambulatory Visit: Payer: Self-pay

## 2024-10-24 ENCOUNTER — Other Ambulatory Visit (HOSPITAL_BASED_OUTPATIENT_CLINIC_OR_DEPARTMENT_OTHER): Payer: Self-pay

## 2024-10-25 ENCOUNTER — Ambulatory Visit: Payer: Self-pay | Admitting: Family Medicine

## 2024-10-25 ENCOUNTER — Other Ambulatory Visit: Payer: Self-pay

## 2024-10-25 ENCOUNTER — Other Ambulatory Visit: Payer: Self-pay | Admitting: Family Medicine

## 2024-10-25 ENCOUNTER — Other Ambulatory Visit (HOSPITAL_BASED_OUTPATIENT_CLINIC_OR_DEPARTMENT_OTHER): Payer: Self-pay

## 2024-10-25 MED ORDER — FLUOXETINE HCL 20 MG PO CAPS
20.0000 mg | ORAL_CAPSULE | Freq: Every day | ORAL | 3 refills | Status: AC
  Filled 2024-10-25: qty 90, 90d supply, fill #0

## 2024-10-26 ENCOUNTER — Other Ambulatory Visit (HOSPITAL_BASED_OUTPATIENT_CLINIC_OR_DEPARTMENT_OTHER): Payer: Self-pay

## 2024-10-30 ENCOUNTER — Ambulatory Visit (INDEPENDENT_AMBULATORY_CARE_PROVIDER_SITE_OTHER): Payer: MEDICAID | Admitting: Family Medicine

## 2024-10-30 ENCOUNTER — Ambulatory Visit: Payer: MEDICAID | Admitting: Family Medicine

## 2024-10-30 ENCOUNTER — Other Ambulatory Visit (HOSPITAL_BASED_OUTPATIENT_CLINIC_OR_DEPARTMENT_OTHER): Payer: Self-pay

## 2024-10-30 ENCOUNTER — Encounter: Payer: Self-pay | Admitting: Family Medicine

## 2024-10-30 VITALS — BP 122/80 | HR 85 | Temp 98.5°F | Ht 73.0 in | Wt 207.8 lb

## 2024-10-30 DIAGNOSIS — E063 Autoimmune thyroiditis: Secondary | ICD-10-CM | POA: Diagnosis not present

## 2024-10-30 DIAGNOSIS — Z1321 Encounter for screening for nutritional disorder: Secondary | ICD-10-CM

## 2024-10-30 DIAGNOSIS — E782 Mixed hyperlipidemia: Secondary | ICD-10-CM | POA: Diagnosis not present

## 2024-10-30 MED ORDER — THYROID 90 MG PO TABS
90.0000 mg | ORAL_TABLET | Freq: Every day | ORAL | 1 refills | Status: AC
  Filled 2024-10-30 – 2024-12-04 (×2): qty 90, 90d supply, fill #0

## 2024-10-30 MED ORDER — ZOLPIDEM TARTRATE ER 12.5 MG PO TBCR
12.5000 mg | EXTENDED_RELEASE_TABLET | Freq: Every evening | ORAL | 0 refills | Status: DC | PRN
  Filled 2024-10-30: qty 15, 30d supply, fill #0
  Filled 2024-11-13: qty 15, 30d supply, fill #1

## 2024-10-30 NOTE — Progress Notes (Signed)
 Acute Office Visit  Patient ID: Curtis Cobb, male    DOB: Apr 29, 1971, 53 y.o.   MRN: [id number]  PCP: Curtis Jeoffrey RAMAN, FNP  Chief Complaint  Patient presents with   Medical Management of Chronic Issues    3 mo f/u  Severe pain and swelling in both knees and hands      Subjective:     HPI  Discussed the use of AI scribe software for clinical note transcription with the patient, who gave verbal consent to proceed.  History of Present Illness Curtis Cobb is a 53 year old male with hypothyroidism who presents with concerns about medication side effects and sleep disturbances.  He is experiencing ongoing issues with his thyroid  medication, NP Thyroid , which he takes at a dose of 75 mg. He feels well if he takes a break from the medication for a day, but feels 'absolutely horrible' if he misses more than 24 hours. This occurs two to three times a month. He feels that his body is 'rejecting something' about the medication. He takes the medication in the morning, ensuring a gap of at least 45 minutes before consuming anything else.  He is experiencing chronic insomnia, having trouble both falling and staying asleep. His sleep has been 'horrible' for years, with a pattern of sleeping for two hours, waking for one to two hours, and then repeating this cycle. He has tried various sleep aids in the past, including Ativan  and Ambien , with Ambien  being 'alright'. He has not taken any sleep aids recently but is willing to try Ambien  again.  He is currently taking Prozac  20 mg in tablet form, gabapentin , Mobic  (meloxicam ) 7.5 mg, and occasionally magnesium . He denies taking biotin or kratom currently. He reports a history of higher cholesterol levels and acknowledges dietary habits that may contribute, such as consuming fatty foods and dairy products. He is physically active, walking approximately 40,000 steps a day, but notes knee pain and concerns about his knee's condition.  In  terms of family history, his mother had a heart attack and died at 93, and her father also died young from a similar cause. He does not smoke or consume alcohol . No significant weight fluctuations, and no cold or heat intolerance, constipation, or diarrhea. He expresses frustration with his body's response to medications and his inability to sleep well.  The 10-year ASCVD risk score (Arnett DK, et al., 2019) is: 7.2%   Values used to calculate the score:     Age: 69 years     Clincally relevant sex: Male     Is Non-Hispanic African American: No     Diabetic: No     Tobacco smoker: No     Systolic Blood Pressure: 122 mmHg     Is BP treated: No     HDL Cholesterol: 27 mg/dL     Total Cholesterol: 214 mg/dL   Review of Systems  All other systems reviewed and are negative.   Past Medical History:  Diagnosis Date   Alcohol  use disorder, moderate, dependence (HCC) 02/13/2014   Anxiety    COVID-19 03/29/2021   Diagnosed in [facility] ED on 03/29/2021   Depression    Hypertension    Rheumatoid arthritis (HCC)    Substance abuse [facility])     Past Surgical History:  Procedure Laterality Date   INCISION AND DRAINAGE ABSCESS Right 08/25/2018   Procedure: INCISION AND DRAINAGE ABSCESS;  Surgeon: Edie Norleen PARAS, MD;  Location: [facility] ORS;  Service: Orthopedics;  Laterality:  Right;   INCISION AND DRAINAGE ABSCESS Right 08/30/2018   Procedure: INCISION AND DRAINAGE ABSCESS;  Surgeon: Edie Norleen PARAS, MD;  Location: [facility] ORS;  Service: Orthopedics;  Laterality: Right;   none      Current Outpatient Medications on File Prior to Visit  Medication Sig Dispense Refill   AMINO ACIDS PO Take 1 packet by mouth daily.     FLUoxetine  (PROZAC ) 20 MG capsule Take 1 capsule (20 mg total) by mouth daily. 90 capsule 3   FLUoxetine  (PROZAC ) 20 MG tablet Take 1 tablet (20 mg total) by mouth daily. 90 tablet 3   gabapentin  (NEURONTIN ) 300 MG capsule Take 1 capsule (300 mg total) by mouth 3 (three) times daily. 270 capsule 1    ibuprofen  (ADVIL ) 200 MG tablet Take 800 mg by mouth daily as needed.     meloxicam  (MOBIC ) 7.5 MG tablet Take 1 tablet (7.5 mg total) by mouth daily. 30 tablet 0   Multiple Vitamins-Minerals (ZINC  PO) Take 1 capsule by mouth daily.     No current facility-administered medications on file prior to visit.    Allergies  Allergen Reactions   Sulfa Antibiotics Other (See Comments)    Unknown reaction Occurred as a child       Objective:    BP 122/80   Pulse 85   Temp 98.5 F (36.9 C)   Ht 6' 1 (1.854 m)   Wt 207 lb 12.8 oz (94.3 kg)   SpO2 98%   BMI 27.42 kg/m    Physical Exam Vitals and nursing note reviewed.  Constitutional:      Appearance: Normal appearance. He is normal weight.  HENT:     Head: Normocephalic and atraumatic.  Cardiovascular:     Rate and Rhythm: Normal rate and regular rhythm.     Pulses: Normal pulses.     Heart sounds: Normal heart sounds.  Pulmonary:     Effort: Pulmonary effort is normal.     Breath sounds: Normal breath sounds.  Skin:    General: Skin is warm and dry.     Capillary Refill: Capillary refill takes less than 2 seconds.  Neurological:     General: No focal deficit present.     Mental Status: He is alert and oriented to person, place, and time. Mental status is at baseline.  Psychiatric:        Mood and Affect: Mood normal.        Behavior: Behavior normal.        Thought Content: Thought content normal.        Judgment: Judgment normal.       No results found for any visits on 10/30/24.     Assessment & Plan:   Problem List Items Addressed This Visit     Hypothyroidism   Relevant Medications   thyroid  (ARMOUR THYROID ) 90 MG tablet   Other Relevant Orders   TSH   Moderate mixed hyperlipidemia not requiring statin therapy   Relevant Orders   Lipid panel   Other Visit Diagnoses       Encounter for vitamin deficiency screening    -  Primary   Relevant Orders   Vitamin B12   VITAMIN D  25 Hydroxy (Vit-D  Deficiency, Fractures)       Assessment and Plan Assessment & Plan Hypothyroidism due to Hashimoto thyroiditis Thyroid  levels suboptimal on current dosage. Sensitive to medication changes. - Increased Armour Thyroid  to 90 mg daily. - Recheck thyroid  levels in 6 weeks.  Mixed  hyperlipidemia Elevated cholesterol with LDL at 153 mg/dL. Family history of early heart disease. Cardiovascular risk 7.2% over 10 years. - Advised dietary modifications to reduce fatty meats, dairy, and butter. - Encouraged use of vegetable oils like avocado oil. - Reassess cholesterol levels in follow-up.  Insomnia Chronic insomnia with previous effective Ambien  use. Sensitive to medications. - Prescribed Ambien  6.25 mg. - Reassess sleep quality in follow-up.  Depression Fluoxetine  20 mg daily. - Continue Fluoxetine  20 mg daily.    Meds ordered this encounter  Medications   thyroid  (ARMOUR THYROID ) 90 MG tablet    Sig: Take 1 tablet (90 mg total) by mouth daily before breakfast.    Dispense:  90 tablet    Refill:  1    Supervising Provider:   DUANNE LOWERS T [[id number]]   zolpidem  (AMBIEN  CR) 12.5 MG CR tablet    Sig: Take 1 tablet (12.5 mg total) by mouth at bedtime as needed for sleep.    Dispense:  30 tablet    Refill:  0    Supervising Provider:   DUANNE LOWERS T [[id number]]    Return in about 6 weeks (around 12/11/2024) for chronic follow-up with labs 1 week prior.  Jeoffrey GORMAN Barrio, FNP [facility]

## 2024-11-10 ENCOUNTER — Other Ambulatory Visit (HOSPITAL_BASED_OUTPATIENT_CLINIC_OR_DEPARTMENT_OTHER): Payer: Self-pay

## 2024-11-13 ENCOUNTER — Other Ambulatory Visit (HOSPITAL_BASED_OUTPATIENT_CLINIC_OR_DEPARTMENT_OTHER): Payer: Self-pay

## 2024-11-28 ENCOUNTER — Other Ambulatory Visit: Payer: Self-pay | Admitting: Family Medicine

## 2024-11-29 ENCOUNTER — Other Ambulatory Visit (HOSPITAL_BASED_OUTPATIENT_CLINIC_OR_DEPARTMENT_OTHER): Payer: Self-pay

## 2024-11-29 MED ORDER — ZOLPIDEM TARTRATE ER 12.5 MG PO TBCR
12.5000 mg | EXTENDED_RELEASE_TABLET | Freq: Every evening | ORAL | 0 refills | Status: DC | PRN
  Filled 2024-11-29 – 2024-12-04 (×3): qty 30, 30d supply, fill #0

## 2024-11-30 ENCOUNTER — Other Ambulatory Visit (HOSPITAL_BASED_OUTPATIENT_CLINIC_OR_DEPARTMENT_OTHER): Payer: Self-pay

## 2024-12-04 ENCOUNTER — Other Ambulatory Visit: Payer: MEDICAID

## 2024-12-04 ENCOUNTER — Other Ambulatory Visit (HOSPITAL_BASED_OUTPATIENT_CLINIC_OR_DEPARTMENT_OTHER): Payer: Self-pay

## 2024-12-04 DIAGNOSIS — E782 Mixed hyperlipidemia: Secondary | ICD-10-CM

## 2024-12-04 DIAGNOSIS — E039 Hypothyroidism, unspecified: Secondary | ICD-10-CM

## 2024-12-04 DIAGNOSIS — E063 Autoimmune thyroiditis: Secondary | ICD-10-CM

## 2024-12-04 DIAGNOSIS — Z1321 Encounter for screening for nutritional disorder: Secondary | ICD-10-CM

## 2024-12-04 DIAGNOSIS — I10 Essential (primary) hypertension: Secondary | ICD-10-CM

## 2024-12-05 ENCOUNTER — Ambulatory Visit: Payer: Self-pay | Admitting: Family Medicine

## 2024-12-05 LAB — VITAMIN D 25 HYDROXY (VIT D DEFICIENCY, FRACTURES): Vit D, 25-Hydroxy: 48 ng/mL (ref 30–100)

## 2024-12-05 LAB — CBC WITH DIFFERENTIAL/PLATELET
Absolute Lymphocytes: 2309 {cells}/uL (ref 850–3900)
Absolute Monocytes: 662 {cells}/uL (ref 200–950)
Basophils Absolute: 37 {cells}/uL (ref 0–200)
Basophils Relative: 0.4 %
Eosinophils Absolute: 460 {cells}/uL (ref 15–500)
Eosinophils Relative: 5 %
HCT: 42.1 % (ref 39.4–51.1)
Hemoglobin: 14 g/dL (ref 13.2–17.1)
MCH: 30.2 pg (ref 27.0–33.0)
MCHC: 33.3 g/dL (ref 31.6–35.4)
MCV: 90.7 fL (ref 81.4–101.7)
MPV: 10.1 fL (ref 7.5–12.5)
Monocytes Relative: 7.2 %
Neutro Abs: 5732 {cells}/uL (ref 1500–7800)
Neutrophils Relative %: 62.3 %
Platelets: 336 Thousand/uL (ref 140–400)
RBC: 4.64 Million/uL (ref 4.20–5.80)
RDW: 13.3 % (ref 11.0–15.0)
Total Lymphocyte: 25.1 %
WBC: 9.2 Thousand/uL (ref 3.8–10.8)

## 2024-12-05 LAB — COMPREHENSIVE METABOLIC PANEL WITH GFR
AG Ratio: 1.5 (calc) (ref 1.0–2.5)
ALT: 7 U/L — ABNORMAL LOW (ref 9–46)
AST: 16 U/L (ref 10–35)
Albumin: 4.5 g/dL (ref 3.6–5.1)
Alkaline phosphatase (APISO): 54 U/L (ref 35–144)
BUN: 12 mg/dL (ref 7–25)
CO2: 28 mmol/L (ref 20–32)
Calcium: 9.2 mg/dL (ref 8.6–10.3)
Chloride: 101 mmol/L (ref 98–110)
Creat: 0.92 mg/dL (ref 0.70–1.30)
Globulin: 3.1 g/dL (ref 1.9–3.7)
Glucose, Bld: 93 mg/dL (ref 65–99)
Potassium: 4.3 mmol/L (ref 3.5–5.3)
Sodium: 139 mmol/L (ref 135–146)
Total Bilirubin: 0.5 mg/dL (ref 0.2–1.2)
Total Protein: 7.6 g/dL (ref 6.1–8.1)
eGFR: 101 mL/min/1.73m2 (ref >=60)

## 2024-12-05 LAB — LIPID PANEL
Cholesterol: 181 mg/dL (ref <200)
HDL: 31 mg/dL — ABNORMAL LOW (ref >=40)
LDL Cholesterol (Calc): 121 mg/dL — ABNORMAL HIGH
Non-HDL Cholesterol (Calc): 150 mg/dL — ABNORMAL HIGH (ref <130)
Total CHOL/HDL Ratio: 5.8 (calc) — ABNORMAL HIGH (ref <5.0)
Triglycerides: 172 mg/dL — ABNORMAL HIGH (ref <150)

## 2024-12-05 LAB — TSH: TSH: 37.82 m[IU]/L — ABNORMAL HIGH (ref 0.40–4.50)

## 2024-12-11 ENCOUNTER — Encounter: Payer: MEDICAID | Admitting: Family Medicine

## 2024-12-27 ENCOUNTER — Other Ambulatory Visit: Payer: Self-pay | Admitting: Family Medicine

## 2024-12-29 ENCOUNTER — Other Ambulatory Visit (HOSPITAL_BASED_OUTPATIENT_CLINIC_OR_DEPARTMENT_OTHER): Payer: Self-pay

## 2024-12-31 MED ORDER — ZOLPIDEM TARTRATE ER 12.5 MG PO TBCR
12.5000 mg | EXTENDED_RELEASE_TABLET | Freq: Every evening | ORAL | 0 refills | Status: DC | PRN
  Filled 2024-12-31: qty 30, 30d supply, fill #0

## 2025-01-01 ENCOUNTER — Other Ambulatory Visit (HOSPITAL_BASED_OUTPATIENT_CLINIC_OR_DEPARTMENT_OTHER): Payer: Self-pay

## 2025-01-17 ENCOUNTER — Other Ambulatory Visit (HOSPITAL_BASED_OUTPATIENT_CLINIC_OR_DEPARTMENT_OTHER): Payer: Self-pay

## 2025-01-17 ENCOUNTER — Encounter: Payer: Self-pay | Admitting: Family Medicine

## 2025-01-17 ENCOUNTER — Other Ambulatory Visit: Payer: Self-pay | Admitting: Family Medicine

## 2025-01-17 MED ORDER — PREDNISONE 20 MG PO TABS
ORAL_TABLET | ORAL | 0 refills | Status: AC
  Filled 2025-01-17: qty 12, 6d supply, fill #0

## 2025-01-24 ENCOUNTER — Other Ambulatory Visit: Payer: Self-pay | Admitting: Family Medicine

## 2025-01-24 ENCOUNTER — Other Ambulatory Visit (HOSPITAL_BASED_OUTPATIENT_CLINIC_OR_DEPARTMENT_OTHER): Payer: Self-pay

## 2025-01-24 ENCOUNTER — Other Ambulatory Visit: Payer: Self-pay

## 2025-01-24 MED ORDER — GABAPENTIN 300 MG PO CAPS
300.0000 mg | ORAL_CAPSULE | Freq: Three times a day (TID) | ORAL | 1 refills | Status: AC
  Filled 2025-01-24: qty 270, 90d supply, fill #0

## 2025-01-24 MED ORDER — ZOLPIDEM TARTRATE ER 12.5 MG PO TBCR
12.5000 mg | EXTENDED_RELEASE_TABLET | Freq: Every evening | ORAL | 0 refills | Status: AC | PRN
  Filled 2025-01-24 – 2025-01-30 (×2): qty 30, 30d supply, fill #0

## 2025-01-30 ENCOUNTER — Other Ambulatory Visit (HOSPITAL_BASED_OUTPATIENT_CLINIC_OR_DEPARTMENT_OTHER): Payer: Self-pay

## 2025-04-10 ENCOUNTER — Encounter: Payer: MEDICAID | Admitting: Family Medicine
